# Patient Record
Sex: Female | Born: 1955
Health system: Southern US, Community
[De-identification: ages and names within clinical notes are randomized; demographics above are authoritative.]

## PROBLEM LIST (undated history)

## (undated) DIAGNOSIS — C50919 Malignant neoplasm of unspecified site of unspecified female breast: Secondary | ICD-10-CM

## (undated) DIAGNOSIS — Z923 Personal history of irradiation: Secondary | ICD-10-CM

## (undated) DIAGNOSIS — H269 Unspecified cataract: Secondary | ICD-10-CM

## (undated) DIAGNOSIS — G43909 Migraine, unspecified, not intractable, without status migrainosus: Secondary | ICD-10-CM

## (undated) DIAGNOSIS — Z9221 Personal history of antineoplastic chemotherapy: Secondary | ICD-10-CM

## (undated) DIAGNOSIS — K649 Unspecified hemorrhoids: Secondary | ICD-10-CM

## (undated) DIAGNOSIS — C801 Malignant (primary) neoplasm, unspecified: Secondary | ICD-10-CM

## (undated) DIAGNOSIS — Z8489 Family history of other specified conditions: Secondary | ICD-10-CM

## (undated) DIAGNOSIS — Z8614 Personal history of Methicillin resistant Staphylococcus aureus infection: Secondary | ICD-10-CM

## (undated) DIAGNOSIS — S82899A Other fracture of unspecified lower leg, initial encounter for closed fracture: Secondary | ICD-10-CM

## (undated) DIAGNOSIS — Z973 Presence of spectacles and contact lenses: Secondary | ICD-10-CM

## (undated) HISTORY — DX: Unspecified hemorrhoids: K64.9

## (undated) HISTORY — PX: MANDIBLE FRACTURE SURGERY: SHX706

## (undated) HISTORY — DX: Unspecified cataract: H26.9

## (undated) HISTORY — DX: Migraine, unspecified, not intractable, without status migrainosus: G43.909

## (undated) HISTORY — PX: FRACTURE SURGERY: SHX138

## (undated) HISTORY — DX: Malignant (primary) neoplasm, unspecified: C80.1

## (undated) HISTORY — PX: MASTECTOMY: SHX3

---

## 2001-02-25 ENCOUNTER — Other Ambulatory Visit: Admission: RE | Admit: 2001-02-25 | Discharge: 2001-02-25 | Payer: Self-pay | Admitting: Obstetrics and Gynecology

## 2001-03-16 ENCOUNTER — Other Ambulatory Visit: Admission: RE | Admit: 2001-03-16 | Discharge: 2001-03-16 | Payer: Self-pay | Admitting: Obstetrics and Gynecology

## 2001-09-24 ENCOUNTER — Other Ambulatory Visit: Admission: RE | Admit: 2001-09-24 | Discharge: 2001-09-24 | Payer: Self-pay | Admitting: Obstetrics and Gynecology

## 2005-09-02 ENCOUNTER — Ambulatory Visit: Payer: Self-pay | Admitting: Unknown Physician Specialty

## 2006-09-10 ENCOUNTER — Ambulatory Visit: Payer: Self-pay | Admitting: Unknown Physician Specialty

## 2007-08-11 ENCOUNTER — Ambulatory Visit: Payer: Self-pay | Admitting: Gastroenterology

## 2007-09-15 ENCOUNTER — Ambulatory Visit: Payer: Self-pay | Admitting: Unknown Physician Specialty

## 2008-09-20 ENCOUNTER — Ambulatory Visit: Payer: Self-pay | Admitting: Unknown Physician Specialty

## 2009-10-26 ENCOUNTER — Ambulatory Visit: Payer: Self-pay | Admitting: Unknown Physician Specialty

## 2010-10-31 ENCOUNTER — Ambulatory Visit: Payer: Self-pay | Admitting: Unknown Physician Specialty

## 2010-11-02 ENCOUNTER — Ambulatory Visit: Payer: Self-pay | Admitting: Unknown Physician Specialty

## 2010-11-18 HISTORY — PX: COLONOSCOPY: SHX5424

## 2011-06-04 ENCOUNTER — Ambulatory Visit: Payer: Self-pay | Admitting: Unknown Physician Specialty

## 2013-11-18 DIAGNOSIS — Z8614 Personal history of Methicillin resistant Staphylococcus aureus infection: Secondary | ICD-10-CM

## 2013-11-18 HISTORY — DX: Personal history of Methicillin resistant Staphylococcus aureus infection: Z86.14

## 2014-05-23 DIAGNOSIS — G252 Other specified forms of tremor: Secondary | ICD-10-CM | POA: Insufficient documentation

## 2015-07-17 ENCOUNTER — Ambulatory Visit (INDEPENDENT_AMBULATORY_CARE_PROVIDER_SITE_OTHER): Payer: 59 | Admitting: Nurse Practitioner

## 2015-07-17 ENCOUNTER — Encounter (INDEPENDENT_AMBULATORY_CARE_PROVIDER_SITE_OTHER): Payer: Self-pay

## 2015-07-17 ENCOUNTER — Encounter: Payer: Self-pay | Admitting: Nurse Practitioner

## 2015-07-17 VITALS — BP 116/68 | HR 60 | Temp 98.7°F | Resp 16 | Ht 65.75 in | Wt 152.6 lb

## 2015-07-17 DIAGNOSIS — Z13220 Encounter for screening for lipoid disorders: Secondary | ICD-10-CM | POA: Diagnosis not present

## 2015-07-17 DIAGNOSIS — Z7189 Other specified counseling: Secondary | ICD-10-CM

## 2015-07-17 DIAGNOSIS — Z130 Encounter for screening for diseases of the blood and blood-forming organs and certain disorders involving the immune mechanism: Secondary | ICD-10-CM

## 2015-07-17 DIAGNOSIS — Z131 Encounter for screening for diabetes mellitus: Secondary | ICD-10-CM | POA: Diagnosis not present

## 2015-07-17 DIAGNOSIS — G43809 Other migraine, not intractable, without status migrainosus: Secondary | ICD-10-CM

## 2015-07-17 DIAGNOSIS — Z1329 Encounter for screening for other suspected endocrine disorder: Secondary | ICD-10-CM

## 2015-07-17 DIAGNOSIS — Z0000 Encounter for general adult medical examination without abnormal findings: Secondary | ICD-10-CM | POA: Insufficient documentation

## 2015-07-17 DIAGNOSIS — N951 Menopausal and female climacteric states: Secondary | ICD-10-CM

## 2015-07-17 DIAGNOSIS — R232 Flushing: Secondary | ICD-10-CM

## 2015-07-17 DIAGNOSIS — Z1389 Encounter for screening for other disorder: Secondary | ICD-10-CM | POA: Diagnosis not present

## 2015-07-17 DIAGNOSIS — Z7689 Persons encountering health services in other specified circumstances: Secondary | ICD-10-CM

## 2015-07-17 DIAGNOSIS — Z85828 Personal history of other malignant neoplasm of skin: Secondary | ICD-10-CM

## 2015-07-17 LAB — CBC WITH DIFFERENTIAL/PLATELET
BASOS PCT: 0.7 % (ref 0.0–3.0)
Basophils Absolute: 0 10*3/uL (ref 0.0–0.1)
EOS ABS: 0.1 10*3/uL (ref 0.0–0.7)
Eosinophils Relative: 1.4 % (ref 0.0–5.0)
HCT: 45.3 % (ref 36.0–46.0)
Hemoglobin: 15.3 g/dL — ABNORMAL HIGH (ref 12.0–15.0)
LYMPHS ABS: 2.2 10*3/uL (ref 0.7–4.0)
Lymphocytes Relative: 39 % (ref 12.0–46.0)
MCHC: 33.8 g/dL (ref 30.0–36.0)
MCV: 89.9 fl (ref 78.0–100.0)
MONO ABS: 0.6 10*3/uL (ref 0.1–1.0)
Monocytes Relative: 9.9 % (ref 3.0–12.0)
NEUTROS ABS: 2.8 10*3/uL (ref 1.4–7.7)
Neutrophils Relative %: 49 % (ref 43.0–77.0)
PLATELETS: 260 10*3/uL (ref 150.0–400.0)
RBC: 5.03 Mil/uL (ref 3.87–5.11)
RDW: 12.6 % (ref 11.5–15.5)
WBC: 5.7 10*3/uL (ref 4.0–10.5)

## 2015-07-17 LAB — TSH: TSH: 1.32 u[IU]/mL (ref 0.35–4.50)

## 2015-07-17 LAB — HEMOGLOBIN A1C: HEMOGLOBIN A1C: 5.5 % (ref 4.6–6.5)

## 2015-07-17 NOTE — Patient Instructions (Signed)
Please visit the lab before leaving today.   Welcome to [facility]! It was nice to meet you!   Follow up in 6 months.

## 2015-07-17 NOTE — Progress Notes (Signed)
Patient ID: Megan Vega, female    DOB: 02/19/1956  Age: 59 y.o. MRN: [id number]  CC: Establish Care   HPI Megan Vega presents for establishing care and CC of   1) Health Maintenance-   Diet- Eats healthy   Exercise- 30 min x 5 days a week   Immunizations- 2014 tdap  Mammogram- 06/30/2015  Pap- 8/121/2016   Colonoscopy- q 10 years clean 2012   Eye Exam- 2011, will schedule    Dental Exam- UTD   2) Chronic Problems- Dr. Sharlett Iles (retired)- Basal cell x 5 yrs ago on chest; got the all clear  Migraines- [facility] in [place]  B vitamins, magnesium, Excedrin   Multivitamin   3) Acute Problems-  Hot flashes- decreased over the past year, took black cohosh was helpful   History Megan Vega has a past medical history of Cancer and Migraines.   She has no past surgical history on file.   Her family history includes Cancer in her father and mother; Diabetes in her brother and mother; Early death in her mother.She reports that she has never smoked. She has never used smokeless tobacco. She reports that she drinks alcohol. She reports that she does not use illicit drugs.  No outpatient prescriptions prior to visit.   No facility-administered medications prior to visit.   ROS Review of Systems  Constitutional: Negative for fever, chills, diaphoresis and fatigue.  HENT: Negative for tinnitus and trouble swallowing.   Eyes: Negative for visual disturbance.  Respiratory: Negative for chest tightness, shortness of breath and wheezing.   Cardiovascular: Negative for chest pain, palpitations and leg swelling.  Gastrointestinal: Negative for nausea, vomiting, diarrhea and constipation.  Genitourinary: Negative for difficulty urinating and menstrual problem.  Skin: Negative for rash.  Neurological: Positive for headaches. Negative for dizziness, weakness and numbness.       Well controlled with current regimen  Psychiatric/Behavioral: Negative for suicidal ideas and sleep disturbance.  The patient is not nervous/anxious.    Objective:  BP 116/68 mmHg  Pulse 60  Temp(Src) 98.7 F (37.1 C)  Resp 16  Ht 5' 5.75" (1.67 m)  Wt 152 lb 9.6 oz (69.219 kg)  BMI 24.82 kg/m2  SpO2 94%  Physical Exam  Constitutional: She is oriented to person, place, and time. She appears well-developed and well-nourished. No distress.  HENT:  Head: Normocephalic and atraumatic.  Right Ear: External ear normal.  Left Ear: External ear normal.  Cardiovascular: Normal rate, regular rhythm, normal heart sounds and intact distal pulses.  Exam reveals no gallop and no friction rub.   No murmur heard. Pulmonary/Chest: Effort normal and breath sounds normal. No respiratory distress. She has no wheezes. She has no rales. She exhibits no tenderness.  Neurological: She is alert and oriented to person, place, and time. No cranial nerve deficit. She exhibits normal muscle tone. Coordination normal.  Skin: Skin is warm and dry. No rash noted. She is not diaphoretic.  Psychiatric: She has a normal mood and affect. Her behavior is normal. Judgment and thought content normal.      Assessment & Plan:   Megan Vega was seen today for establish care.  Diagnoses and all orders for this visit:  Hot flashes  Encounter to establish care  Screening for nephropathy -     Comprehensive metabolic panel  Screening, anemia, deficiency, iron -     CBC with Differential/Platelet  Screening for hyperlipidemia -     Lipid panel  Screening for diabetes mellitus -     Hemoglobin  A1c  Screening for thyroid disorder -     TSH  History of basal cell cancer  Other migraine without status migrainosus, not intractable  Megan Vega does not currently have medications on file.  No orders of the defined types were placed in this encounter.     Follow-up: Return in about 6 months (around 01/16/2016).

## 2015-07-17 NOTE — Progress Notes (Signed)
Pre visit review using our clinic review tool, if applicable. No additional management support is needed unless otherwise documented below in the visit note. 

## 2015-07-18 LAB — LIPID PANEL
CHOL/HDL RATIO: 4
Cholesterol: 207 mg/dL — ABNORMAL HIGH (ref 0–200)
HDL: 58.4 mg/dL (ref >39.00)
LDL CALC: 127 mg/dL — AB (ref 0–99)
NonHDL: 148.47
TRIGLYCERIDES: 107 mg/dL (ref 0.0–149.0)
VLDL: 21.4 mg/dL (ref 0.0–40.0)

## 2015-07-18 LAB — COMPREHENSIVE METABOLIC PANEL
ALT: 24 U/L (ref 0–35)
AST: 25 U/L (ref 0–37)
Albumin: 4.5 g/dL (ref 3.5–5.2)
Alkaline Phosphatase: 76 U/L (ref 39–117)
BUN: 13 mg/dL (ref 6–23)
CHLORIDE: 102 meq/L (ref 96–112)
CO2: 30 meq/L (ref 19–32)
CREATININE: 0.91 mg/dL (ref 0.40–1.20)
Calcium: 9.7 mg/dL (ref 8.4–10.5)
GFR: 67.19 mL/min (ref >60.00)
Glucose, Bld: 76 mg/dL (ref 70–99)
Potassium: 4.2 mEq/L (ref 3.5–5.1)
SODIUM: 139 meq/L (ref 135–145)
Total Bilirubin: 0.8 mg/dL (ref 0.2–1.2)
Total Protein: 7.5 g/dL (ref 6.0–8.3)

## 2015-08-02 DIAGNOSIS — R232 Flushing: Secondary | ICD-10-CM | POA: Insufficient documentation

## 2015-08-02 DIAGNOSIS — Z85828 Personal history of other malignant neoplasm of skin: Secondary | ICD-10-CM | POA: Insufficient documentation

## 2015-08-02 DIAGNOSIS — Z130 Encounter for screening for diseases of the blood and blood-forming organs and certain disorders involving the immune mechanism: Secondary | ICD-10-CM | POA: Insufficient documentation

## 2015-08-02 DIAGNOSIS — Z131 Encounter for screening for diabetes mellitus: Secondary | ICD-10-CM | POA: Insufficient documentation

## 2015-08-02 DIAGNOSIS — Z1329 Encounter for screening for other suspected endocrine disorder: Secondary | ICD-10-CM | POA: Insufficient documentation

## 2015-08-02 DIAGNOSIS — G43909 Migraine, unspecified, not intractable, without status migrainosus: Secondary | ICD-10-CM | POA: Insufficient documentation

## 2015-08-02 NOTE — Assessment & Plan Note (Signed)
Pt on Black cohosh and found it helpful. They are improving over the last year, but she feels they should be less than they are. FU prn worsening/failure to improve.

## 2015-08-02 NOTE — Assessment & Plan Note (Signed)
5 years ago. Clear currently.

## 2015-08-02 NOTE — Assessment & Plan Note (Signed)
Discussed acute and chronic issues. Reviewed health maintenance measures, PFSHx, and immunizations. Obtain routine labs TSH, Lipid panel, CBC w/ diff, A1c, and CMET.   

## 2015-08-02 NOTE — Assessment & Plan Note (Signed)
Pt was seen by [facility] in [place]. Taking B vitamins, magnesium, excedrin, and another multivitamin. Helpful. Will follow.

## 2015-08-11 ENCOUNTER — Encounter: Payer: Self-pay | Admitting: Nurse Practitioner

## 2015-08-13 ENCOUNTER — Emergency Department
Admission: EM | Admit: 2015-08-13 | Discharge: 2015-08-13 | Disposition: A | Discharge status: Home / Self Care | Payer: 59 | Attending: Student | Admitting: Student

## 2015-08-13 ENCOUNTER — Encounter: Payer: Self-pay | Admitting: Emergency Medicine

## 2015-08-13 DIAGNOSIS — W57XXXA Bitten or stung by nonvenomous insect and other nonvenomous arthropods, initial encounter: Secondary | ICD-10-CM | POA: Insufficient documentation

## 2015-08-13 DIAGNOSIS — Y998 Other external cause status: Secondary | ICD-10-CM | POA: Diagnosis not present

## 2015-08-13 DIAGNOSIS — Y9289 Other specified places as the place of occurrence of the external cause: Secondary | ICD-10-CM | POA: Insufficient documentation

## 2015-08-13 DIAGNOSIS — L03115 Cellulitis of right lower limb: Secondary | ICD-10-CM | POA: Diagnosis not present

## 2015-08-13 DIAGNOSIS — S80861A Insect bite (nonvenomous), right lower leg, initial encounter: Secondary | ICD-10-CM | POA: Diagnosis present

## 2015-08-13 DIAGNOSIS — Y9389 Activity, other specified: Secondary | ICD-10-CM | POA: Diagnosis not present

## 2015-08-13 MED ORDER — SULFAMETHOXAZOLE-TRIMETHOPRIM 800-160 MG PO TABS: 1.0000 | ORAL_TABLET | Freq: Two times a day (BID) | ORAL | Status: DC

## 2015-08-13 MED ORDER — HYDROCODONE-ACETAMINOPHEN 5-325 MG PO TABS: 1.0000 | ORAL_TABLET | ORAL | Status: DC | PRN

## 2015-08-13 NOTE — ED Notes (Addendum)
Pt has abscess with erythema to right calf x1 week per report. Denies injury but assumes insect bite. Increasing redness and pain per report. OTC creams used without relief.

## 2015-08-13 NOTE — ED Provider Notes (Signed)
[facility] Emergency Department Provider Note  ____________________________________________  Time seen: Approximately 10:14 AM  I have reviewed the triage vital signs and the nursing notes.   HISTORY  Chief Complaint Animal Bite   HPI Megan Vega is a 59 y.o. female is here with complaint of possible spider bite on her left leg one week ago. Patient states that she has tried every over-the-counter cream without any improvement. Area continues to get more red and somewhat warm. There is been some drainage. She is unaware of any fever chills. She never actually saw the spider but is seems that it was one. Currently she rates her pain 8 out of 10 and constant.   Past Medical History  Diagnosis Date  . Cancer     Basal Cell Carcinoma, about 2011  . Migraines     Patient Active Problem List   Diagnosis Date Noted  . Hot flashes 08/02/2015  . Screening for thyroid disorder 08/02/2015  . Screening for diabetes mellitus 08/02/2015  . Screening, anemia, deficiency, iron 08/02/2015  . History of basal cell cancer 08/02/2015  . Migraines 08/02/2015  . Encounter to establish care 07/17/2015    History reviewed. No pertinent past surgical history.  Current Outpatient Rx  Name  Route  Sig  Dispense  Refill  . HYDROcodone-acetaminophen (NORCO/VICODIN) 5-325 MG per tablet   Oral   Take 1 tablet by mouth every 4 (four) hours as needed for moderate pain.   20 tablet   0   . sulfamethoxazole-trimethoprim (BACTRIM DS,SEPTRA DS) 800-160 MG per tablet   Oral   Take 1 tablet by mouth 2 (two) times daily.   20 tablet   0     Allergies Review of patient's allergies indicates no known allergies.  Family History  Problem Relation Age of Onset  . Cancer Mother     Esophageal Cancer  . Diabetes Mother     Controlled with diet  . Early death Mother     Age 77  . Cancer Father     Lung Cancer  . Diabetes Brother     Controlled with diet    Social  History Social History  Substance Use Topics  . Smoking status: Never Smoker   . Smokeless tobacco: Never Used  . Alcohol Use: Yes    Review of Systems Constitutional: No fever/chills ENT: No sore throat. Cardiovascular: Denies chest pain. Respiratory: Denies shortness of breath. Gastrointestinal:   No nausea, no vomiting. Genitourinary: Negative for dysuria. Musculoskeletal: Negative for back pain. Skin: Negative for rash. Positive for insect bite left leg Neurological: Negative for headaches, focal weakness or numbness.  10-point ROS otherwise negative.  ____________________________________________   PHYSICAL EXAM:  VITAL SIGNS: ED Triage Vitals  Enc Vitals Group     BP 08/13/15 0848 125/72 mmHg     Pulse Rate 08/13/15 0848 92     Resp 08/13/15 0848 20     Temp 08/13/15 0848 98 F (36.7 C)     Temp Source 08/13/15 0848 Oral     SpO2 08/13/15 0848 96 %     Weight 08/13/15 0848 150 lb (68.04 kg)     Height 08/13/15 0848 5\' 5"  (1.651 m)     Head Cir --      Peak Flow --      Pain Score 08/13/15 0852 8     Pain Loc --      Pain Edu? --      Excl. in [place]? --  Constitutional: Alert and oriented. Well appearing and in no acute distress. Eyes: Conjunctivae are normal. PERRL. EOMI. Head: Atraumatic. Nose: No congestion/rhinnorhea. Neck: No stridor.   Cardiovascular: Normal rate, regular rhythm. Grossly normal heart sounds.  Good peripheral circulation. Respiratory: Normal respiratory effort.  No retractions. Lungs CTAB. Gastrointestinal: Soft and nontender. No distention. Musculoskeletal: Moves all extremities with any difficulty. There is mild to moderate tenderness left leg around the lesion. There is no restriction with knee or ankle. Neurologic:  Normal speech and language. No gross focal neurologic deficits are appreciated. No gait instability. Skin:  Skin is warm, dry and intact. No rash noted. There is 1 single lesion on the left leg with open center without  active drainage. There is moderate erythema and warmth approximately 1-1/2-2 cm surrounding this area. There is no fluctuant area in the erythematous skin. Psychiatric: Mood and affect are normal. Speech and behavior are normal.  ____________________________________________   LABS (all labs ordered are listed, but only abnormal results are displayed)  Labs Reviewed - No data to display  PROCEDURES  Procedure(s) performed: None  Critical Care performed: No  ____________________________________________   INITIAL IMPRESSION / ASSESSMENT AND PLAN / ED COURSE  Pertinent labs & imaging results that were available during my care of the patient were reviewed by me and considered in my medical decision making (see chart for details).  Patient was placed on Septra DS twice a day for 10 days along with Norco as needed for pain. She is to follow-up with her PCP or follow-up with Dr. Adonis Huguenin if any continued problems. Discussed warm compresses to her leg frequently. She is to return to the emergency room if any severe worsening of hers leg __________________________________________   FINAL CLINICAL IMPRESSION(S) / ED DIAGNOSES  Final diagnoses:  Cellulitis of leg, right      Johnn Hai, PA-C 08/13/15 1043  Joanne Gavel, MD 08/13/15 1520

## 2015-08-13 NOTE — ED Notes (Signed)
Pt presents to the ER from home with complaints of pain to left lower extremity. Pt reports she believes she was bitten by a spider last week, area to left lower extremity has been progressively been getting worse. Pt reports pain, denies any fever. Pt talks in complete sentences denies any other symptom at present. No respiratory distress noted.

## 2015-08-24 ENCOUNTER — Ambulatory Visit: Payer: Self-pay | Admitting: Nurse Practitioner

## 2016-11-21 ENCOUNTER — Ambulatory Visit: Payer: 59 | Admitting: Family

## 2016-11-25 ENCOUNTER — Encounter: Payer: Self-pay | Admitting: Family

## 2016-11-25 ENCOUNTER — Ambulatory Visit (INDEPENDENT_AMBULATORY_CARE_PROVIDER_SITE_OTHER): Payer: BLUE CROSS/BLUE SHIELD | Admitting: Family

## 2016-11-25 VITALS — BP 134/80 | HR 72 | Temp 98.2°F | Ht 65.0 in | Wt 153.7 lb

## 2016-11-25 DIAGNOSIS — L03116 Cellulitis of left lower limb: Secondary | ICD-10-CM | POA: Diagnosis not present

## 2016-11-25 MED ORDER — DOXYCYCLINE HYCLATE 100 MG PO TBEC: 100.0000 mg | DELAYED_RELEASE_TABLET | Freq: Two times a day (BID) | ORAL | 0 refills | Status: DC

## 2016-11-25 NOTE — Progress Notes (Signed)
Pre visit review using our clinic review tool, if applicable. No additional management support is needed unless otherwise documented below in the visit note. 

## 2016-11-25 NOTE — Progress Notes (Signed)
Subjective:    Patient ID: Megan Vega, female    DOB: [birth year], 61 y.o.   MRN: [id number]  CC: Megan Vega is a 61 y.o. female who presents today for an acute visit.    HPI: CC: left leg sore x 10 days,worsening. 'felt like something stung me like had happened with right leg.' Tender. No fever, SOB. Purulent drainage  H/o staph infection, last to right leg and treated with doxycycline. Failed treatment to bactrim.  Told positive for MRSA.           HISTORY:  Past Medical History:  Diagnosis Date  . Cancer ([place])    Basal Cell Carcinoma, about 2011  . Migraines    No past surgical history on file. Family History  Problem Relation Age of Onset  . Cancer Mother     Esophageal Cancer  . Diabetes Mother     Controlled with diet  . Early death Mother     Age 63  . Cancer Father     Lung Cancer  . Diabetes Brother     Controlled with diet    Allergies: Patient has no known allergies. No current outpatient prescriptions on file prior to visit.   No current facility-administered medications on file prior to visit.     Social History  Substance Use Topics  . Smoking status: Never Smoker  . Smokeless tobacco: Never Used  . Alcohol use Yes    Review of Systems  Constitutional: Negative for chills and fever.  Respiratory: Negative for cough and shortness of breath.   Cardiovascular: Negative for chest pain, palpitations and leg swelling.  Gastrointestinal: Negative for nausea and vomiting.  Skin: Positive for wound.      Objective:    BP 134/80   Pulse 72   Temp 98.2 F (36.8 C) (Oral)   Ht 5\' 5"  (1.651 m)   Wt 153 lb 11.2 oz (69.7 kg)   SpO2 98%   BMI 25.58 kg/m    Physical Exam  Constitutional: She appears well-developed and well-nourished.  Eyes: Conjunctivae are normal.  Cardiovascular: Normal rate, regular rhythm, normal heart sounds and normal pulses.   Pulmonary/Chest: Effort normal and breath sounds normal. She has no wheezes. She  has no rhonchi. She has no rales.  Neurological: She is alert.  Skin: Skin is warm and dry.     Hyper erythematous indurated area, approx 4 cm in diameter, noted left lateral calf. No drainage. Surrounding erythema. Nonflutuant.   Psychiatric: She has a normal mood and affect. Her speech is normal and behavior is normal. Thought content normal.  Vitals reviewed.      Assessment & Plan:   Problem List Items Addressed This Visit      Other   Cellulitis of left lower extremity - Primary    Afebrile. Cellulitis responded well to doxycycline in the past. Failed Bactrim. History of MRSA. Unable to culture as wound nonfluctuant today. Patient will return in 2 days for wound check.      Relevant Medications   doxycycline (DORYX) 100 MG EC tablet        I have discontinued [name]'s HYDROcodone-acetaminophen and sulfamethoxazole-trimethoprim. I am also having her start on doxycycline.   Meds ordered this encounter  Medications  . doxycycline (DORYX) 100 MG EC tablet    Sig: Take 1 tablet (100 mg total) by mouth 2 (two) times daily.    Dispense:  20 tablet    Refill:  0    Order Specific Question:  Supervising Provider    Answer:   Crecencio Mc [[id number]]    Return precautions given.   Risks, benefits, and alternatives of the medications and treatment plan prescribed today were discussed, and patient expressed understanding.   Education regarding symptom management and diagnosis given to patient on AVS.  Continue to follow with Mable Paris, FNP for routine health maintenance.   Megan Vega and I agreed with plan.   Mable Paris, FNP

## 2016-11-25 NOTE — Patient Instructions (Addendum)
Due for physical, please make appt at check out.   Wound recheck on Wed afternoon or Thurs of this week   Pleasure meeting you  Continue to change dressings and let me know if not better    Cellulitis, Adult Cellulitis is a skin infection. The infected area is usually red and tender. This condition occurs most often in the arms and lower legs. The infection can travel to the muscles, blood, and underlying tissue and become serious. It is very important to get treated for this condition. What are the causes? Cellulitis is caused by bacteria. The bacteria enter through a break in the skin, such as a cut, burn, insect bite, open sore, or crack. What increases the risk? This condition is more likely to occur in people who:  Have a weak defense system (immune system).  Have open wounds on the skin such as cuts, burns, bites, and scrapes. Bacteria can enter the body through these open wounds.  Are older.  Have diabetes.  Have a type of long-lasting (chronic) liver disease (cirrhosis) or kidney disease.  Use IV drugs. What are the signs or symptoms? Symptoms of this condition include:  Redness, streaking, or spotting on the skin.  Swollen area of the skin.  Tenderness or pain when an area of the skin is touched.  Warm skin.  Fever.  Chills.  Blisters. How is this diagnosed? This condition is diagnosed based on a medical history and physical exam. You may also have tests, including:  Blood tests.  Lab tests.  Imaging tests. How is this treated? Treatment for this condition may include:  Medicines, such as antibiotic medicines or antihistamines.  Supportive care, such as rest and application of cold or warm cloths (cold or warm compresses) to the skin.  Hospital care, if the condition is severe. The infection usually gets better within 1-2 days of treatment. Follow these instructions at home:  Take over-the-counter and prescription medicines only as told by your  health care provider.  If you were prescribed an antibiotic medicine, take it as told by your health care provider. Do not stop taking the antibiotic even if you start to feel better.  Drink enough fluid to keep your urine clear or pale yellow.  Do not touch or rub the infected area.  Raise (elevate) the infected area above the level of your heart while you are sitting or lying down.  Apply warm or cold compresses to the affected area as told by your health care provider.  Keep all follow-up visits as told by your health care provider. This is important. These visits let your health care provider make sure a more serious infection is not developing. Contact a health care provider if:  You have a fever.  Your symptoms do not improve within 1-2 days of starting treatment.  Your bone or joint underneath the infected area becomes painful after the skin has healed.  Your infection returns in the same area or another area.  You notice a swollen bump in the infected area.  You develop new symptoms.  You have a general ill feeling (malaise) with muscle aches and pains. Get help right away if:  Your symptoms get worse.  You feel very sleepy.  You develop vomiting or diarrhea that persists.  You notice red streaks coming from the infected area.  Your red area gets larger or turns dark in color. This information is not intended to replace advice given to you by your health care provider. Make sure you  discuss any questions you have with your health care provider. Document Released: 08/14/2005 Document Revised: 03/14/2016 Document Reviewed: 09/13/2015 Elsevier Interactive Patient Education  2017 [employer].

## 2016-11-26 DIAGNOSIS — L03116 Cellulitis of left lower limb: Secondary | ICD-10-CM | POA: Insufficient documentation

## 2016-11-26 NOTE — Assessment & Plan Note (Signed)
Afebrile. Cellulitis responded well to doxycycline in the past. Failed Bactrim. History of MRSA. Unable to culture as wound nonfluctuant today. Patient will return in 2 days for wound check.

## 2016-12-05 ENCOUNTER — Ambulatory Visit: Payer: BLUE CROSS/BLUE SHIELD | Admitting: Family

## 2016-12-09 ENCOUNTER — Ambulatory Visit (INDEPENDENT_AMBULATORY_CARE_PROVIDER_SITE_OTHER): Payer: BLUE CROSS/BLUE SHIELD | Admitting: Family

## 2016-12-09 ENCOUNTER — Encounter: Payer: Self-pay | Admitting: Family

## 2016-12-09 VITALS — BP 140/86 | HR 75 | Temp 98.0°F | Ht 65.0 in | Wt 155.2 lb

## 2016-12-09 DIAGNOSIS — L03116 Cellulitis of left lower limb: Secondary | ICD-10-CM | POA: Diagnosis not present

## 2016-12-09 MED ORDER — MUPIROCIN CALCIUM 2 % EX CREA: 1.0000 "application " | TOPICAL_CREAM | Freq: Three times a day (TID) | CUTANEOUS | 0 refills | Status: DC

## 2016-12-09 NOTE — Patient Instructions (Signed)
Glad it is looking better

## 2016-12-09 NOTE — Progress Notes (Signed)
   Subjective:    Patient ID: Megan Vega, female    DOB: [birth year], 61 y.o.   MRN: [id number]  CC: Megan Vega is a 61 y.o. female who presents today for follow up.   HPI: Follow up left leg cellulitis 2 weeks ago. Started on doxycycline and improved. No pain, itching, discharge from wound. Scab left.   No fever, chills.     HISTORY:  Past Medical History:  Diagnosis Date  . Cancer ([place])    Basal Cell Carcinoma, about 2011  . Migraines    No past surgical history on file. Family History  Problem Relation Age of Onset  . Cancer Mother     Esophageal Cancer  . Diabetes Mother     Controlled with diet  . Early death Mother     Age 2  . Cancer Father     Lung Cancer  . Diabetes Brother     Controlled with diet    Allergies: Patient has no known allergies. No current outpatient prescriptions on file prior to visit.   No current facility-administered medications on file prior to visit.     Social History  Substance Use Topics  . Smoking status: Never Smoker  . Smokeless tobacco: Never Used  . Alcohol use Yes    Review of Systems  Constitutional: Negative for chills and fever.  Respiratory: Negative for cough.   Cardiovascular: Negative for chest pain and palpitations.  Gastrointestinal: Negative for nausea and vomiting.      Objective:    BP 140/86   Pulse 75   Temp 98 F (36.7 C) (Oral)   Ht 5\' 5"  (1.651 m)   Wt 155 lb 3.2 oz (70.4 kg)   SpO2 97%   BMI 25.83 kg/m  BP Readings from Last 3 Encounters:  12/09/16 140/86  11/25/16 134/80  08/13/15 125/72   Wt Readings from Last 3 Encounters:  12/09/16 155 lb 3.2 oz (70.4 kg)  11/25/16 153 lb 11.2 oz (69.7 kg)  08/13/15 150 lb (68 kg)    Physical Exam  Constitutional: She appears well-developed and well-nourished.  Eyes: Conjunctivae are normal.  Cardiovascular: Normal rate, regular rhythm, normal heart sounds and normal pulses.   Pulmonary/Chest: Effort normal and breath sounds normal.  She has no wheezes. She has no rhonchi. She has no rales.  Neurological: She is alert.  Skin: Skin is warm and dry.     Scab and localized erythema LLE. No discharge, warmth.   Psychiatric: She has a normal mood and affect. Her speech is normal and behavior is normal. Thought content normal.  Vitals reviewed.      Assessment & Plan:   1. Cellulitis of left lower extremity Resolving. No active infection, only scab, and suspect scarring.  Afebrile. Given bactroban for future wounds.   - mupirocin cream (BACTROBAN) 2 %; Apply 1 application topically 3 (three) times daily.  Dispense: 15 g; Refill: 0   I have discontinued Ms. Turski's doxycycline.   No orders of the defined types were placed in this encounter.   Return precautions given.   Risks, benefits, and alternatives of the medications and treatment plan prescribed today were discussed, and patient expressed understanding.   Education regarding symptom management and diagnosis given to patient on AVS.  Continue to follow with Mable Paris, FNP for routine health maintenance.   Megan Vega and I agreed with plan.   Mable Paris, FNP

## 2016-12-09 NOTE — Progress Notes (Signed)
Pre visit review using our clinic review tool, if applicable. No additional management support is needed unless otherwise documented below in the visit note. 

## 2017-08-06 ENCOUNTER — Encounter: Payer: BLUE CROSS/BLUE SHIELD | Admitting: Family

## 2017-08-20 ENCOUNTER — Other Ambulatory Visit (HOSPITAL_COMMUNITY)
Admission: RE | Admit: 2017-08-20 | Discharge: 2017-08-20 | Disposition: A | Discharge status: Home / Self Care | Payer: BLUE CROSS/BLUE SHIELD | Source: Ambulatory Visit | Attending: Family | Admitting: Family

## 2017-08-20 ENCOUNTER — Telehealth: Payer: Self-pay

## 2017-08-20 ENCOUNTER — Encounter: Payer: Self-pay | Admitting: Family

## 2017-08-20 ENCOUNTER — Ambulatory Visit (INDEPENDENT_AMBULATORY_CARE_PROVIDER_SITE_OTHER): Payer: BLUE CROSS/BLUE SHIELD | Admitting: Family

## 2017-08-20 VITALS — BP 124/82 | HR 78 | Temp 98.0°F | Ht 65.0 in | Wt 160.8 lb

## 2017-08-20 DIAGNOSIS — Z0000 Encounter for general adult medical examination without abnormal findings: Secondary | ICD-10-CM

## 2017-08-20 LAB — LIPID PANEL
Cholesterol: 207 mg/dL — ABNORMAL HIGH (ref 0–200)
HDL: 53.8 mg/dL (ref >39.00)
LDL Cholesterol: 126 mg/dL — ABNORMAL HIGH (ref 0–99)
NONHDL: 153.32
TRIGLYCERIDES: 136 mg/dL (ref 0.0–149.0)
Total CHOL/HDL Ratio: 4
VLDL: 27.2 mg/dL (ref 0.0–40.0)

## 2017-08-20 LAB — CBC WITH DIFFERENTIAL/PLATELET
Basophils Absolute: 0.1 10*3/uL (ref 0.0–0.1)
Basophils Relative: 1.3 % (ref 0.0–3.0)
EOS PCT: 1.7 % (ref 0.0–5.0)
Eosinophils Absolute: 0.1 10*3/uL (ref 0.0–0.7)
HCT: 44.7 % (ref 36.0–46.0)
HEMOGLOBIN: 15 g/dL (ref 12.0–15.0)
Lymphocytes Relative: 36.8 % (ref 12.0–46.0)
Lymphs Abs: 1.6 10*3/uL (ref 0.7–4.0)
MCHC: 33.6 g/dL (ref 30.0–36.0)
MCV: 89 fl (ref 78.0–100.0)
Monocytes Absolute: 0.5 10*3/uL (ref 0.1–1.0)
Monocytes Relative: 10.3 % (ref 3.0–12.0)
Neutro Abs: 2.2 10*3/uL (ref 1.4–7.7)
Neutrophils Relative %: 49.9 % (ref 43.0–77.0)
Platelets: 270 10*3/uL (ref 150.0–400.0)
RBC: 5.02 Mil/uL (ref 3.87–5.11)
RDW: 12.5 % (ref 11.5–15.5)
WBC: 4.4 10*3/uL (ref 4.0–10.5)

## 2017-08-20 LAB — COMPREHENSIVE METABOLIC PANEL
ALBUMIN: 4.6 g/dL (ref 3.5–5.2)
ALK PHOS: 67 U/L (ref 39–117)
ALT: 19 U/L (ref 0–35)
AST: 24 U/L (ref 0–37)
BUN: 11 mg/dL (ref 6–23)
CO2: 29 mEq/L (ref 19–32)
Calcium: 9.9 mg/dL (ref 8.4–10.5)
Chloride: 103 mEq/L (ref 96–112)
Creatinine, Ser: 0.99 mg/dL (ref 0.40–1.20)
GFR: 60.54 mL/min (ref >60.00)
Glucose, Bld: 101 mg/dL — ABNORMAL HIGH (ref 70–99)
POTASSIUM: 4.7 meq/L (ref 3.5–5.1)
Sodium: 139 mEq/L (ref 135–145)
TOTAL PROTEIN: 7.9 g/dL (ref 6.0–8.3)
Total Bilirubin: 0.8 mg/dL (ref 0.2–1.2)

## 2017-08-20 LAB — VITAMIN D 25 HYDROXY (VIT D DEFICIENCY, FRACTURES): VITD: 35.35 ng/mL (ref 30.00–100.00)

## 2017-08-20 LAB — TSH: TSH: 1.23 u[IU]/mL (ref 0.35–4.50)

## 2017-08-20 LAB — HEMOGLOBIN A1C: HEMOGLOBIN A1C: 5.9 % (ref 4.6–6.5)

## 2017-08-20 NOTE — Telephone Encounter (Signed)
FYI. Patient was unable to give enough blood for HIV and the HEP C.

## 2017-08-20 NOTE — Progress Notes (Signed)
Pre visit review using our clinic review tool, if applicable. No additional management support is needed unless otherwise documented below in the visit note. 

## 2017-08-20 NOTE — Assessment & Plan Note (Addendum)
Pap and cbe performed. Patient thinks colonoscopy UTD and will bring me records. Labs today. Referral to derm. Patient understands to schedule mammogram.

## 2017-08-20 NOTE — Patient Instructions (Addendum)
Please let me know when last colonoscopy was; please bring records by.   We placed a referral for mammogram this year. I asked that you call one the below locations and schedule this when it is convenient for you.   As discussed, I would like you to ask for 3D mammogram over the traditional 2D mammogram as new evidence suggest 3D is superior.   Please note that NOT all insurance companies cover 3D and you may have to pay a higher copay. You may call your insurance company to further clarify your benefits.   Options for [place]  [place], [place]  * [facility] [place], [place] * Note if you ask for 3D mammogram at this location, you must request [place], [place] location*   Labs today  Health Maintenance, Female Adopting a healthy lifestyle and getting preventive care can go a long way to promote health and wellness. Talk with your health care provider about what schedule of regular examinations is right for you. This is a good chance for you to check in with your provider about disease prevention and staying healthy. In between checkups, there are plenty of things you can do on your own. Experts have done a lot of research about which lifestyle changes and preventive measures are most likely to keep you healthy. Ask your health care provider for more information. Weight and diet Eat a healthy diet  Be sure to include plenty of vegetables, fruits, low-fat dairy products, and lean protein.  Do not eat a lot of foods high in solid fats, added sugars, or salt.  Get regular exercise. This is one of the most important things you can do for your health. ? Most adults should exercise for at least 150 minutes each week. The exercise should increase your heart rate and make you sweat (moderate-intensity exercise). ? Most adults should also  do strengthening exercises at least twice a week. This is in addition to the moderate-intensity exercise.  Maintain a healthy weight  Body mass index (BMI) is a measurement that can be used to identify possible weight problems. It estimates body fat based on height and weight. Your health care provider can help determine your BMI and help you achieve or maintain a healthy weight.  For females 73 years of age and older: ? A BMI below 18.5 is considered underweight. ? A BMI of 18.5 to 24.9 is normal. ? A BMI of 25 to 29.9 is considered overweight. ? A BMI of 30 and above is considered obese.  Watch levels of cholesterol and blood lipids  You should start having your blood tested for lipids and cholesterol at 61 years of age, then have this test every 5 years.  You may need to have your cholesterol levels checked more often if: ? Your lipid or cholesterol levels are high. ? You are older than 61 years of age. ? You are at high risk for heart disease.  Cancer screening Lung Cancer  Lung cancer screening is recommended for adults 59-35 years old who are at high risk for lung cancer because of a history of smoking.  A yearly low-dose CT scan of the lungs is recommended for people who: ? Currently smoke. ? Have quit within the past 15 years. ? Have at least a 30-pack-year history of smoking. A pack year is smoking an average of  one pack of cigarettes a day for 1 year.  Yearly screening should continue until it has been 15 years since you quit.  Yearly screening should stop if you develop a health problem that would prevent you from having lung cancer treatment.  Breast Cancer  Practice breast self-awareness. This means understanding how your breasts normally appear and feel.  It also means doing regular breast self-exams. Let your health care provider know about any changes, no matter how small.  If you are in your 20s or 30s, you should have a clinical breast exam (CBE) by a health  care provider every 1-3 years as part of a regular health exam.  If you are 65 or older, have a CBE every year. Also consider having a breast X-ray (mammogram) every year.  If you have a family history of breast cancer, talk to your health care provider about genetic screening.  If you are at high risk for breast cancer, talk to your health care provider about having an MRI and a mammogram every year.  Breast cancer gene (BRCA) assessment is recommended for women who have family members with BRCA-related cancers. BRCA-related cancers include: ? Breast. ? Ovarian. ? Tubal. ? Peritoneal cancers.  Results of the assessment will determine the need for genetic counseling and BRCA1 and BRCA2 testing.  Cervical Cancer Your health care provider may recommend that you be screened regularly for cancer of the pelvic organs (ovaries, uterus, and vagina). This screening involves a pelvic examination, including checking for microscopic changes to the surface of your cervix (Pap test). You may be encouraged to have this screening done every 3 years, beginning at age 39.  For women ages 64-65, health care providers may recommend pelvic exams and Pap testing every 3 years, or they may recommend the Pap and pelvic exam, combined with testing for human papilloma virus (HPV), every 5 years. Some types of HPV increase your risk of cervical cancer. Testing for HPV may also be done on women of any age with unclear Pap test results.  Other health care providers may not recommend any screening for nonpregnant women who are considered low risk for pelvic cancer and who do not have symptoms. Ask your health care provider if a screening pelvic exam is right for you.  If you have had past treatment for cervical cancer or a condition that could lead to cancer, you need Pap tests and screening for cancer for at least 20 years after your treatment. If Pap tests have been discontinued, your risk factors (such as having a new  sexual partner) need to be reassessed to determine if screening should resume. Some women have medical problems that increase the chance of getting cervical cancer. In these cases, your health care provider may recommend more frequent screening and Pap tests.  Colorectal Cancer  This type of cancer can be detected and often prevented.  Routine colorectal cancer screening usually begins at 60 years of age and continues through 61 years of age.  Your health care provider may recommend screening at an earlier age if you have risk factors for colon cancer.  Your health care provider may also recommend using home test kits to check for hidden blood in the stool.  A small camera at the end of a tube can be used to examine your colon directly (sigmoidoscopy or colonoscopy). This is done to check for the earliest forms of colorectal cancer.  Routine screening usually begins at age 50.  Direct examination of the colon should  be repeated every 5-10 years through 61 years of age. However, you may need to be screened more often if early forms of precancerous polyps or small growths are found.  Skin Cancer  Check your skin from head to toe regularly.  Tell your health care provider about any new moles or changes in moles, especially if there is a change in a mole's shape or color.  Also tell your health care provider if you have a mole that is larger than the size of a pencil eraser.  Always use sunscreen. Apply sunscreen liberally and repeatedly throughout the day.  Protect yourself by wearing long sleeves, pants, a wide-brimmed hat, and sunglasses whenever you are outside.  Heart disease, diabetes, and high blood pressure  High blood pressure causes heart disease and increases the risk of stroke. High blood pressure is more likely to develop in: ? People who have blood pressure in the high end of the normal range (130-139/85-89 mm Hg). ? People who are overweight or obese. ? People who are  African American.  If you are 74-79 years of age, have your blood pressure checked every 3-5 years. If you are 62 years of age or older, have your blood pressure checked every year. You should have your blood pressure measured twice-once when you are at a hospital or clinic, and once when you are not at a hospital or clinic. Record the average of the two measurements. To check your blood pressure when you are not at a hospital or clinic, you can use: ? An automated blood pressure machine at a pharmacy. ? A home blood pressure monitor.  If you are between 81 years and 49 years old, ask your health care provider if you should take aspirin to prevent strokes.  Have regular diabetes screenings. This involves taking a blood sample to check your fasting blood sugar level. ? If you are at a normal weight and have a low risk for diabetes, have this test once every three years after 61 years of age. ? If you are overweight and have a high risk for diabetes, consider being tested at a younger age or more often. Preventing infection Hepatitis B  If you have a higher risk for hepatitis B, you should be screened for this virus. You are considered at high risk for hepatitis B if: ? You were born in a country where hepatitis B is common. Ask your health care provider which countries are considered high risk. ? Your parents were born in a high-risk country, and you have not been immunized against hepatitis B (hepatitis B vaccine). ? You have HIV or AIDS. ? You use needles to inject street drugs. ? You live with someone who has hepatitis B. ? You have had sex with someone who has hepatitis B. ? You get hemodialysis treatment. ? You take certain medicines for conditions, including cancer, organ transplantation, and autoimmune conditions.  Hepatitis C  Blood testing is recommended for: ? Everyone born from [birth year] through 1965. ? Anyone with known risk factors for hepatitis C.  Sexually transmitted  infections (STIs)  You should be screened for sexually transmitted infections (STIs) including gonorrhea and chlamydia if: ? You are sexually active and are younger than 61 years of age. ? You are older than 61 years of age and your health care provider tells you that you are at risk for this type of infection. ? Your sexual activity has changed since you were last screened and you are at an increased risk  for chlamydia or gonorrhea. Ask your health care provider if you are at risk.  If you do not have HIV, but are at risk, it may be recommended that you take a prescription medicine daily to prevent HIV infection. This is called pre-exposure prophylaxis (PrEP). You are considered at risk if: ? You are sexually active and do not regularly use condoms or know the HIV status of your partner(s). ? You take drugs by injection. ? You are sexually active with a partner who has HIV.  Talk with your health care provider about whether you are at high risk of being infected with HIV. If you choose to begin PrEP, you should first be tested for HIV. You should then be tested every 3 months for as long as you are taking PrEP. Pregnancy  If you are premenopausal and you may become pregnant, ask your health care provider about preconception counseling.  If you may become pregnant, take 400 to 800 micrograms (mcg) of folic acid every day.  If you want to prevent pregnancy, talk to your health care provider about birth control (contraception). Osteoporosis and menopause  Osteoporosis is a disease in which the bones lose minerals and strength with aging. This can result in serious bone fractures. Your risk for osteoporosis can be identified using a bone density scan.  If you are 54 years of age or older, or if you are at risk for osteoporosis and fractures, ask your health care provider if you should be screened.  Ask your health care provider whether you should take a calcium or vitamin D supplement to lower  your risk for osteoporosis.  Menopause may have certain physical symptoms and risks.  Hormone replacement therapy may reduce some of these symptoms and risks. Talk to your health care provider about whether hormone replacement therapy is right for you. Follow these instructions at home:  Schedule regular health, dental, and eye exams.  Stay current with your immunizations.  Do not use any tobacco products including cigarettes, chewing tobacco, or electronic cigarettes.  If you are pregnant, do not drink alcohol.  If you are breastfeeding, limit how much and how often you drink alcohol.  Limit alcohol intake to no more than 1 drink per day for nonpregnant women. One drink equals 12 ounces of beer, 5 ounces of wine, or 1 ounces of hard liquor.  Do not use street drugs.  Do not share needles.  Ask your health care provider for help if you need support or information about quitting drugs.  Tell your health care provider if you often feel depressed.  Tell your health care provider if you have ever been abused or do not feel safe at home. This information is not intended to replace advice given to you by your health care provider. Make sure you discuss any questions you have with your health care provider. Document Released: 05/20/2011 Document Revised: 04/11/2016 Document Reviewed: 08/08/2015 Elsevier Interactive Patient Education  [employer].

## 2017-08-20 NOTE — Progress Notes (Signed)
Subjective:    Patient ID: Megan Vega, female    DOB: 03-07-1956, 61 y.o.   MRN: [id number]  CC: Lilyana Lippman is a 61 y.o. female who presents today for physical exam.    HPI: Feeling well. No complaints.     Colorectal Cancer Screening: due; however thinks UTD Breast Cancer Screening: Mammogram due Cervical Cancer Screening: due;  Bone Health screening/DEXA for 65+: No increased fracture risk. Defer screening at this time. Lung Cancer Screening: Doesn't have 30 year pack year history and age > 28 years.       Tetanus - utd  Hepatitis C screening - Candidate for, consents HIV Screening- Candidate for , consents Labs: Screening labs today. Exercise: Gets regular exercise.  Alcohol use: Occasionally.  Smoking/tobacco use: Nonsmoker.  Regular dental exams:UTD Wears seat belt: Yes. Skin: changed lesion right arm; h/o skin cancer.   HISTORY:  Past Medical History:  Diagnosis Date  . Cancer ([place])    Basal Cell Carcinoma, about 2011  . Migraines     History reviewed. No pertinent surgical history. Family History  Problem Relation Age of Onset  . Cancer Mother        Esophageal Cancer  . Diabetes Mother        Controlled with diet  . Early death Mother        Age 97  . Cancer Father        Lung Cancer  . Diabetes Brother        Controlled with diet      ALLERGIES: Patient has no known allergies.  No current outpatient prescriptions on file prior to visit.   No current facility-administered medications on file prior to visit.     Social History  Substance Use Topics  . Smoking status: Never Smoker  . Smokeless tobacco: Never Used  . Alcohol use Yes    Review of Systems  Constitutional: Negative for chills, fever and unexpected weight change.  HENT: Negative for congestion.   Respiratory: Negative for cough.   Cardiovascular: Negative for chest pain, palpitations and leg swelling.  Gastrointestinal: Negative for nausea and vomiting.    Genitourinary: Negative for dysuria, pelvic pain and vaginal bleeding.  Musculoskeletal: Negative for arthralgias and myalgias.  Skin: Negative for rash.  Neurological: Negative for headaches.  Hematological: Negative for adenopathy.  Psychiatric/Behavioral: Negative for confusion.      Objective:    BP 124/82   Pulse 78   Temp 98 F (36.7 C) (Oral)   Ht 5\' 5"  (1.651 m)   Wt 160 lb 12.8 oz (72.9 kg)   SpO2 96%   BMI 26.76 kg/m   BP Readings from Last 3 Encounters:  08/20/17 124/82  12/09/16 140/86  11/25/16 134/80   Wt Readings from Last 3 Encounters:  08/20/17 160 lb 12.8 oz (72.9 kg)  12/09/16 155 lb 3.2 oz (70.4 kg)  11/25/16 153 lb 11.2 oz (69.7 kg)    Physical Exam  Constitutional: She appears well-developed and well-nourished.  Eyes: Conjunctivae are normal.  Neck: No thyroid mass and no thyromegaly present.  Cardiovascular: Normal rate, regular rhythm, normal heart sounds and normal pulses.   Pulmonary/Chest: Effort normal and breath sounds normal. She has no wheezes. She has no rhonchi. She has no rales. Right breast exhibits no inverted nipple, no mass, no nipple discharge, no skin change and no tenderness. Left breast exhibits no inverted nipple, no mass, no nipple discharge, no skin change and no tenderness. Breasts are symmetrical.  No masses or  asymmetry appreciated during CBE.  Genitourinary: Uterus is not enlarged, not fixed and not tender. Cervix exhibits no motion tenderness, no discharge and no friability. Right adnexum displays no mass, no tenderness and no fullness. Left adnexum displays no mass, no tenderness and no fullness.  Genitourinary Comments: Pap performed. No CMT. Unable to appreciated ovaries.  Lymphadenopathy:       Head (right side): No submental, no submandibular, no tonsillar, no preauricular, no posterior auricular and no occipital adenopathy present.       Head (left side): No submental, no submandibular, no tonsillar, no preauricular,  no posterior auricular and no occipital adenopathy present.       Right cervical: No superficial cervical, no deep cervical and no posterior cervical adenopathy present.      Left cervical: No superficial cervical, no deep cervical and no posterior cervical adenopathy present.    She has no axillary adenopathy.       Right axillary: No pectoral and no lateral adenopathy present.       Left axillary: No pectoral and no lateral adenopathy present. Neurological: She is alert.  Skin: Skin is warm and dry.  Darkened nevi right medial aspect of forearm. Non bleeding.   Psychiatric: She has a normal mood and affect. Her speech is normal and behavior is normal. Thought content normal.  Vitals reviewed.      Assessment & Plan:   Problem List Items Addressed This Visit      Other   Routine physical examination - Primary    Pap and cbe performed. Patient thinks colonoscopy UTD and will bring me records. Labs today. Referral to derm. Patient understands to schedule mammogram.       Relevant Orders   CBC with Differential/Platelet   Comprehensive metabolic panel   Hemoglobin A1c   Hepatitis C antibody   HIV antibody   Lipid panel   Cytology - PAP   TSH   VITAMIN D 25 Hydroxy (Vit-D Deficiency, Fractures)   Ambulatory referral to Dermatology   MM SCREENING BREAST TOMO BILATERAL       I have discontinued Ms. Baisley's mupirocin cream.   No orders of the defined types were placed in this encounter.   Return precautions given.   Risks, benefits, and alternatives of the medications and treatment plan prescribed today were discussed, and patient expressed understanding.   Education regarding symptom management and diagnosis given to patient on AVS.   Continue to follow with Burnard Hawthorne, FNP for routine health maintenance.   Megan Vega and I agreed with plan.   Mable Paris, FNP

## 2017-08-21 ENCOUNTER — Other Ambulatory Visit (INDEPENDENT_AMBULATORY_CARE_PROVIDER_SITE_OTHER): Payer: BLUE CROSS/BLUE SHIELD

## 2017-08-21 DIAGNOSIS — Z0000 Encounter for general adult medical examination without abnormal findings: Secondary | ICD-10-CM | POA: Diagnosis not present

## 2017-08-21 LAB — CYTOLOGY - PAP
Diagnosis: NEGATIVE
HPV: NOT DETECTED

## 2017-08-21 NOTE — Telephone Encounter (Signed)
Future orders placed; please call pt and make her lab appt

## 2017-08-21 NOTE — Addendum Note (Signed)
Addended by: Arby Barrette on: 08/21/2017 07:38 AM   Modules accepted: Orders

## 2017-08-21 NOTE — Telephone Encounter (Signed)
Patient has lab appointment today at 1600

## 2017-08-22 LAB — HEPATITIS C ANTIBODY
Hepatitis C Ab: NONREACTIVE
SIGNAL TO CUT-OFF: 0.03 (ref <1.00)

## 2017-08-22 LAB — HIV ANTIBODY (ROUTINE TESTING W REFLEX): HIV 1&2 Ab, 4th Generation: NONREACTIVE

## 2017-08-26 ENCOUNTER — Other Ambulatory Visit: Payer: Self-pay | Admitting: *Deleted

## 2017-08-26 ENCOUNTER — Inpatient Hospital Stay
Admission: RE | Admit: 2017-08-26 | Discharge: 2017-08-26 | Disposition: A | Discharge status: Home / Self Care | Payer: Self-pay | Source: Ambulatory Visit | Attending: *Deleted | Admitting: *Deleted

## 2017-08-26 DIAGNOSIS — Z9289 Personal history of other medical treatment: Secondary | ICD-10-CM

## 2017-10-14 ENCOUNTER — Ambulatory Visit
Admission: RE | Admit: 2017-10-14 | Discharge: 2017-10-14 | Disposition: A | Discharge status: Home / Self Care | Payer: BLUE CROSS/BLUE SHIELD | Source: Ambulatory Visit | Attending: Family | Admitting: Family

## 2017-10-14 DIAGNOSIS — R928 Other abnormal and inconclusive findings on diagnostic imaging of breast: Secondary | ICD-10-CM | POA: Diagnosis not present

## 2017-10-14 DIAGNOSIS — N6489 Other specified disorders of breast: Secondary | ICD-10-CM | POA: Diagnosis not present

## 2017-10-14 DIAGNOSIS — R921 Mammographic calcification found on diagnostic imaging of breast: Secondary | ICD-10-CM | POA: Diagnosis not present

## 2017-10-14 DIAGNOSIS — Z1231 Encounter for screening mammogram for malignant neoplasm of breast: Secondary | ICD-10-CM | POA: Insufficient documentation

## 2017-10-14 DIAGNOSIS — Z0000 Encounter for general adult medical examination without abnormal findings: Secondary | ICD-10-CM

## 2017-10-15 ENCOUNTER — Other Ambulatory Visit: Payer: Self-pay | Admitting: Family

## 2017-10-15 ENCOUNTER — Other Ambulatory Visit: Payer: Self-pay | Admitting: Internal Medicine

## 2017-10-15 ENCOUNTER — Telehealth: Payer: Self-pay

## 2017-10-15 DIAGNOSIS — N6320 Unspecified lump in the left breast, unspecified quadrant: Secondary | ICD-10-CM

## 2017-10-15 DIAGNOSIS — R921 Mammographic calcification found on diagnostic imaging of breast: Secondary | ICD-10-CM

## 2017-10-15 DIAGNOSIS — N6310 Unspecified lump in the right breast, unspecified quadrant: Secondary | ICD-10-CM

## 2017-10-15 DIAGNOSIS — R928 Other abnormal and inconclusive findings on diagnostic imaging of breast: Secondary | ICD-10-CM

## 2017-10-15 NOTE — Telephone Encounter (Signed)
Spoke with Langley Gauss to let her know that orders were in

## 2017-10-15 NOTE — Telephone Encounter (Signed)
Yes, please order,  I will sign

## 2017-10-15 NOTE — Telephone Encounter (Signed)
Ok to give orders?  Copied from [place] [id number]. Topic: General - Other >> Oct 15, 2017  9:42 AM Yvette Rack wrote: Reason for CRM:  Langley Gauss from [facility] [phone] calling for orders of imaging of left breast calcification  and imaging of right distortion and mass

## 2017-10-15 NOTE — Telephone Encounter (Signed)
Left message for Denise to call back

## 2017-10-16 ENCOUNTER — Other Ambulatory Visit: Payer: Self-pay | Admitting: Family Medicine

## 2017-10-16 DIAGNOSIS — N6310 Unspecified lump in the right breast, unspecified quadrant: Secondary | ICD-10-CM

## 2017-10-16 DIAGNOSIS — R921 Mammographic calcification found on diagnostic imaging of breast: Secondary | ICD-10-CM

## 2017-10-16 DIAGNOSIS — R928 Other abnormal and inconclusive findings on diagnostic imaging of breast: Secondary | ICD-10-CM

## 2017-10-16 DIAGNOSIS — N6320 Unspecified lump in the left breast, unspecified quadrant: Secondary | ICD-10-CM

## 2017-10-23 ENCOUNTER — Ambulatory Visit
Admission: RE | Admit: 2017-10-23 | Discharge: 2017-10-23 | Disposition: A | Discharge status: Home / Self Care | Payer: BLUE CROSS/BLUE SHIELD | Source: Ambulatory Visit | Attending: Family Medicine | Admitting: Family Medicine

## 2017-10-23 DIAGNOSIS — R928 Other abnormal and inconclusive findings on diagnostic imaging of breast: Secondary | ICD-10-CM

## 2017-10-23 DIAGNOSIS — R921 Mammographic calcification found on diagnostic imaging of breast: Secondary | ICD-10-CM | POA: Insufficient documentation

## 2017-10-23 DIAGNOSIS — N6320 Unspecified lump in the left breast, unspecified quadrant: Secondary | ICD-10-CM | POA: Insufficient documentation

## 2017-10-23 DIAGNOSIS — N6310 Unspecified lump in the right breast, unspecified quadrant: Secondary | ICD-10-CM | POA: Insufficient documentation

## 2017-10-24 ENCOUNTER — Other Ambulatory Visit: Payer: Self-pay | Admitting: Family Medicine

## 2017-10-24 DIAGNOSIS — R928 Other abnormal and inconclusive findings on diagnostic imaging of breast: Secondary | ICD-10-CM

## 2017-10-24 DIAGNOSIS — N6320 Unspecified lump in the left breast, unspecified quadrant: Secondary | ICD-10-CM

## 2017-10-24 DIAGNOSIS — N6310 Unspecified lump in the right breast, unspecified quadrant: Secondary | ICD-10-CM

## 2017-10-29 ENCOUNTER — Ambulatory Visit
Admission: RE | Admit: 2017-10-29 | Discharge: 2017-10-29 | Disposition: A | Discharge status: Home / Self Care | Payer: BLUE CROSS/BLUE SHIELD | Source: Ambulatory Visit | Attending: Family Medicine | Admitting: Family Medicine

## 2017-10-29 DIAGNOSIS — N6320 Unspecified lump in the left breast, unspecified quadrant: Secondary | ICD-10-CM

## 2017-10-29 DIAGNOSIS — C50212 Malignant neoplasm of upper-inner quadrant of left female breast: Secondary | ICD-10-CM | POA: Diagnosis not present

## 2017-10-29 DIAGNOSIS — R928 Other abnormal and inconclusive findings on diagnostic imaging of breast: Secondary | ICD-10-CM

## 2017-10-29 DIAGNOSIS — C50211 Malignant neoplasm of upper-inner quadrant of right female breast: Secondary | ICD-10-CM | POA: Insufficient documentation

## 2017-10-29 DIAGNOSIS — N6310 Unspecified lump in the right breast, unspecified quadrant: Secondary | ICD-10-CM

## 2017-10-29 DIAGNOSIS — C773 Secondary and unspecified malignant neoplasm of axilla and upper limb lymph nodes: Secondary | ICD-10-CM | POA: Diagnosis not present

## 2017-10-29 DIAGNOSIS — R590 Localized enlarged lymph nodes: Secondary | ICD-10-CM | POA: Diagnosis present

## 2017-10-29 HISTORY — PX: BREAST BIOPSY: SHX20

## 2017-10-30 ENCOUNTER — Other Ambulatory Visit: Payer: Self-pay | Admitting: Pathology

## 2017-10-31 ENCOUNTER — Telehealth: Payer: Self-pay | Admitting: *Deleted

## 2017-10-31 LAB — SURGICAL PATHOLOGY

## 2017-10-31 NOTE — Telephone Encounter (Addendum)
  Oncology Nurse Navigator Documentation  Navigator Location: [facility] (10/31/17 1500)   )    Abnormal Finding Date: (P) 10/23/17 (10/31/17 1500) Confirmed Diagnosis Date: (P) 10/30/17 (10/31/17 1500)                   Barriers/Navigation Needs: (P) Education;Coordination of Care (10/31/17 1500)    Patient with newly diagnosed breast cancer.  Left her a message to give me a call.  I would like to introduce her to navigation services and schedule surgical and oncology consults.    Patient returned my call.  States she was informed of her positive biopsy results by the radiologist.  States she would like to see Dr. Mike Gip.  Informed patient she was on vacation and would not be back until after Christmas.  Informed patient that we could work her in next week with someone else, but the patient would like to wait to see her.  Appointment scheduled to see Dr. Mike Gip on 11/14/17 @ 8:30.  I will schedule her a surgical consult on Monday when the practices are back in the office.  The patient is to let me know if she has a preference as to who she would like to see.  I will talk to her again on Monday.  States she is fine waiting until after Christmas to see a Psychologist, sport and exercise also.

## 2017-11-04 ENCOUNTER — Ambulatory Visit: Payer: BLUE CROSS/BLUE SHIELD | Admitting: Oncology

## 2017-11-04 ENCOUNTER — Encounter: Payer: Self-pay | Admitting: *Deleted

## 2017-11-12 ENCOUNTER — Ambulatory Visit: Payer: BLUE CROSS/BLUE SHIELD | Admitting: Oncology

## 2017-11-14 ENCOUNTER — Telehealth: Payer: Self-pay | Admitting: *Deleted

## 2017-11-14 ENCOUNTER — Encounter: Payer: Self-pay | Admitting: *Deleted

## 2017-11-14 ENCOUNTER — Inpatient Hospital Stay: Payer: BLUE CROSS/BLUE SHIELD

## 2017-11-14 ENCOUNTER — Other Ambulatory Visit: Payer: Self-pay

## 2017-11-14 ENCOUNTER — Inpatient Hospital Stay: Payer: BLUE CROSS/BLUE SHIELD | Attending: Oncology | Admitting: Hematology and Oncology

## 2017-11-14 ENCOUNTER — Encounter: Payer: Self-pay | Admitting: Hematology and Oncology

## 2017-11-14 DIAGNOSIS — Z8052 Family history of malignant neoplasm of bladder: Secondary | ICD-10-CM | POA: Diagnosis not present

## 2017-11-14 DIAGNOSIS — C50411 Malignant neoplasm of upper-outer quadrant of right female breast: Secondary | ICD-10-CM

## 2017-11-14 DIAGNOSIS — D0512 Intraductal carcinoma in situ of left breast: Secondary | ICD-10-CM | POA: Insufficient documentation

## 2017-11-14 DIAGNOSIS — Z85828 Personal history of other malignant neoplasm of skin: Secondary | ICD-10-CM | POA: Insufficient documentation

## 2017-11-14 DIAGNOSIS — G43909 Migraine, unspecified, not intractable, without status migrainosus: Secondary | ICD-10-CM

## 2017-11-14 DIAGNOSIS — Z87891 Personal history of nicotine dependence: Secondary | ICD-10-CM | POA: Insufficient documentation

## 2017-11-14 DIAGNOSIS — Z801 Family history of malignant neoplasm of trachea, bronchus and lung: Secondary | ICD-10-CM | POA: Insufficient documentation

## 2017-11-14 DIAGNOSIS — Z800 Family history of malignant neoplasm of digestive organs: Secondary | ICD-10-CM | POA: Diagnosis not present

## 2017-11-14 DIAGNOSIS — Z170 Estrogen receptor positive status [ER+]: Secondary | ICD-10-CM | POA: Diagnosis not present

## 2017-11-14 LAB — CBC WITH DIFFERENTIAL/PLATELET
BASOS PCT: 1 %
Basophils Absolute: 0.1 10*3/uL (ref 0–0.1)
Eosinophils Absolute: 0.1 10*3/uL (ref 0–0.7)
Eosinophils Relative: 2 %
HEMATOCRIT: 43.6 % (ref 35.0–47.0)
HEMOGLOBIN: 14.8 g/dL (ref 12.0–16.0)
LYMPHS ABS: 1.6 10*3/uL (ref 1.0–3.6)
Lymphocytes Relative: 32 %
MCH: 29.8 pg (ref 26.0–34.0)
MCHC: 33.9 g/dL (ref 32.0–36.0)
MCV: 87.8 fL (ref 80.0–100.0)
MONO ABS: 0.5 10*3/uL (ref 0.2–0.9)
MONOS PCT: 10 %
NEUTROS ABS: 2.7 10*3/uL (ref 1.4–6.5)
NEUTROS PCT: 55 %
Platelets: 277 10*3/uL (ref 150–440)
RBC: 4.96 MIL/uL (ref 3.80–5.20)
RDW: 13 % (ref 11.5–14.5)
WBC: 4.9 10*3/uL (ref 3.6–11.0)

## 2017-11-14 LAB — COMPREHENSIVE METABOLIC PANEL
ALBUMIN: 4.6 g/dL (ref 3.5–5.0)
ALK PHOS: 70 U/L (ref 38–126)
ALT: 22 U/L (ref 14–54)
ANION GAP: 8 (ref 5–15)
AST: 28 U/L (ref 15–41)
BUN: 19 mg/dL (ref 6–20)
CALCIUM: 9.7 mg/dL (ref 8.9–10.3)
CHLORIDE: 102 mmol/L (ref 101–111)
CO2: 29 mmol/L (ref 22–32)
CREATININE: 0.94 mg/dL (ref 0.44–1.00)
GFR calc non Af Amer: >60 mL/min (ref >60)
GLUCOSE: 101 mg/dL — AB (ref 65–99)
Potassium: 4.1 mmol/L (ref 3.5–5.1)
SODIUM: 139 mmol/L (ref 135–145)
Total Bilirubin: 0.7 mg/dL (ref 0.3–1.2)
Total Protein: 8.2 g/dL — ABNORMAL HIGH (ref 6.5–8.1)

## 2017-11-14 NOTE — Telephone Encounter (Signed)
Called patient to make her aware of her date and time of her   MRI.  Message was left. and also a letter will be mailed out.

## 2017-11-14 NOTE — Progress Notes (Signed)
[facility] day:  11/14/2017  Chief Complaint: Megan Vega is a 61 y.o. female with breast cancer who is referred in consultation by Dr. Tommi Rumps for assessment and management.  HPI: The patient undergoes regular screening mammograms.  Last bilateral screening mammogram on 06/29/2015 revealed no evidence of malignancy. She performs monthly self breast exams.  She has not felt a mass.  Bilateral screening mammogram on 10/14/2017 revealed architectural distortion and possible right axillary adenopathy in the right breast.  There were microcalcifications with a possible associated mass in the left breast. She notes that the RIGHT nipple "looked flatter" after the mammogram. She denies nipple discharge.  Bilateral diagnostic mammogram and ultrasound on 10/23/2017 revealed a 3.9 x 1.7 x 2.2 cm irregular hypoechoic mass with associated areas of vascularity at the 10 o'clock position 3 cm from the nipple and a 1.1 x 0.5 x 0.4 cm area of nodularity (3 approximately 3 mm nodules at 10 o'clock 6 cm from the nipple in the right breast. The mass was within a 5 cm area of pleomorphic microcalcifications.  Ultrasound revealed at least 5 prominent lymph nodes with diffuse thickened and hypoechoic cortices suggestive of metastatic disease.  In the left breast, there was an irregular 1.6 x 1.3 x 1.2 cm hypoechoic mass with indistinct margins at 2 o'clock 3 cm from the nipple.  Axillary ultrasound revealed normal nodes.  She underwent ultrasound guided biopsy on 10/29/2017. Pathology from the right breast at the 10 o'clock position 3 cm from the nipple revealed a 1.4 cm grade II invasive mammary carcinoma of no special type.  Tumor was ER + (> 90%), PR + (90%) and Her2/neu 1+.  Right axillary node biopsy revealed metastatic carcinoma.  Left breast biopsy at the 2 o'clock position 3 cm from the nipple revealed high grade DCIS with comedonecrosis.  Symptomatically,  denies any new symptoms.  She has a history of frequent migraine headaches. She has "allergy related" sinus drainage. She denies fevers, sweats, and weight loss. She has not experienced any vasomotor symptoms since she went through menopause.  She denies any bone pain or abdominal symptoms.  Menarche was at the age of 20. Patient is a Advertising copywriter; reared a son (currently 71) and a daughter (currently.36). First pregnancy was in 1982. Patient breast fed for a short 3 month period with her first child. She was on BCPs for about 30 years. She has never been on HRT. Patient went through menopause at the age of 28.   Patient has a positive family history of lung and bladder cancer (father), and esophageal cancer (mother). There is no known breast or ovarian cancer.   Past Medical History:  Diagnosis Date  . Cancer ([place])    Basal Cell Carcinoma, about 2011  . Cataract   . Hemorrhoids   . Migraines     Past Surgical History:  Procedure Laterality Date  . BREAST BIOPSY Bilateral 10/29/2017   rt core with axilla left core also path pending  . CESAREAN SECTION    . COLONOSCOPY  2012  . MANDIBLE FRACTURE SURGERY      Family History  Problem Relation Age of Onset  . Cancer Mother        Esophageal Cancer  . Early death Mother        Age 65  . Cancer Father        Lung Cancer  . Diabetes Father   . Diabetes Brother  Controlled with diet  . Heart attack Paternal Grandfather     Social History:  reports that she quit smoking about 41 years ago. She has a 2.50 pack-year smoking history. she has never used smokeless tobacco. She reports that she drinks about 0.6 oz of alcohol per week. She reports that she does not use drugs.  Patient is a former smoker for 20 years; only smoked "about 3 cigarettes a day"; stopped 30 years ago. Patient drinks alcohol very infrequently; "about 1 glass a month". Patient currently employed as an Web designer at [employer]. She denies any known  exposures to radiations or toxins.  She lives in [place].  The patient is accompanied by her daughter [name]) and Tanya Nones, RN (nurse navigator)  today.  Allergies:  Allergies  Allergen Reactions  . Peanut-Containing Drug Products     Current Medications: Current Outpatient Medications  Medication Sig Dispense Refill  . b complex vitamins tablet Take 1 tablet by mouth daily.    . Bilberry, Vaccinium myrtillus, (BILBERRY PO) Take by mouth.    . Calcium Carbonate-Vit D-Min (CALCIUM 1200 PO) Take by mouth.    . Flaxseed, Linseed, (FLAX SEED OIL PO) Take by mouth.    . Lactobacillus (PROBIOTIC ACIDOPHILUS PO) Take by mouth.    . magnesium gluconate (MAGONATE) 500 MG tablet Take 500 mg by mouth 2 (two) times daily.    . Multiple Vitamins-Minerals (HAIR/SKIN/NAILS/BIOTIN PO) Take by mouth.    . TURMERIC PO Take by mouth.     No current facility-administered medications for this visit.     Review of Systems:  GENERAL:  Feels "fine".  Active.  No fevers, sweats or weight loss. PERFORMANCE STATUS (ECOG):  0 HEENT:  Allergy symptoms.  No visual changes, runny nose, sore throat, mouth sores or tenderness. Lungs: No shortness of breath or cough.  No hemoptysis. Cardiac:  No chest pain, palpitations, orthopnea, or PND. GI:  No nausea, vomiting, diarrhea, constipation, melena or hematochezia.  Last colonoscopy 2012. GU:  No urgency, frequency, dysuria, or hematuria. Musculoskeletal:  No back pain.  No joint pain.  No muscle tenderness. Extremities:  No pain or swelling. Skin:  No rashes or skin changes. Neuro:  Migraine headaches.  No numbness or weakness, balance or coordination issues. Endocrine:  No diabetes, thyroid issues, hot flashes or night sweats. Psych:  No mood changes, depression or anxiety. Pain:  No focal pain. Review of systems:  All other systems reviewed and found to be negative.  Physical Exam: Blood pressure 132/84, pulse 79, height 5' 4.5" (1.638 m), weight  160 lb 4 oz (72.7 kg). GENERAL:  Well developed, well nourished, woman sitting comfortably in the exam room in no acute distress. MENTAL STATUS:  Alert and oriented to person, place and time. HEAD:  Blonde hair.  Normocephalic, atraumatic, face symmetric, no Cushingoid features. EYES:  Dark green eyes.  Pupils equal round and reactive to light and accomodation.  No conjunctivitis or scleral icterus. ENT:  Oropharynx clear without lesion.  Tongue normal. Mucous membranes moist.  RESPIRATORY:  Clear to auscultation without rales, wheezes or rhonchi. CARDIOVASCULAR:  Regular rate and rhythm without murmur, rub or gallop. BREAST:  Right breast with a 4 x 6 cm mass s/p biopsy in the upper outer quadrant (10 o'clock position) with mild inferior resolving ecchymosis.  No skin changes or nipple discharge.  Left breast with a 4 cm area of fullness s/p biopsy in the upper outer quadrant (2 o'clock).  No skin changes or  nipple discharge.  ABDOMEN:  Soft, non-tender, with active bowel sounds, and no hepatosplenomegaly.  No masses. SKIN:  No rashes, ulcers or lesions. EXTREMITIES: No edema, no skin discoloration or tenderness.  No palpable cords. LYMPH NODES:  1-1.5 cm mobile high right axillary lymph node.  No palpable cervical, supraclavicular, or inguinal adenopathy  NEUROLOGICAL: Unremarkable. PSYCH:  Appropriate.   No visits with results within 3 Day(s) from this visit.  Latest known visit with results is:  Hospital Outpatient Visit on 10/29/2017  Component Date Value Ref Range Status  . SURGICAL PATHOLOGY 10/29/2017    Final-Edited                   Value:Surgical Pathology THIS IS AN ADDENDUM REPORT CASE: [id number] PATIENT: Megan Vega Surgical Pathology Report Addendum  Reason for Addendum #1:  Additional clinical/test information  SPECIMEN SUBMITTED: A. Breast, right, 10:00, 3 CMFN B. Lymph node, right axilla C. Breast, left, 2:00, 3 CMFN  CLINICAL HISTORY: Masses and  adenopathy  PRE-OPERATIVE DIAGNOSIS: High concern for metastatic [facility]  POST-OPERATIVE DIAGNOSIS: None provided.     DIAGNOSIS: A.  RIGHT BREAST, 10:00, 3 CMFN; ULTRASOUND-GUIDED CORE BIOPSY: - INVASIVE MAMMARY CARCINOMA OF NO SPECIAL TYPE.  Size of invasive carcinoma:  1.4 cm in this sample Histologic grade of invasive carcinoma: Grade 2      Glandular/tubular differentiation score: 3      Nuclear pleomorphism score: 3      Mitotic rate score: 1  B.  LYMPH NODE, RIGHT AXILLA; ULTRASOUND-GUIDED CORE BIOPSY: - METASTATIC CARCINOMA OF BREAST ORIGIN.  C.  LEFT BREAST, 2:00, 3 CMFN; ULTRASOUND-GUIDE                         D CORE BIOPSY: - HIGH-GRADE DUCTAL CARCINOMA IN SITU WITH COMEDONECROSIS.  ER/PR/HER2: Immunohistochemistry will be performed with reflex to [place] for HER2 2+ (equivocal) results. The results will be reported in an addendum.  The diagnosis was called to [facility] on 10/30/17 at 105 PM.  GROSS DESCRIPTION:  A. The specimen is received in a formalin-filled container labeled with the patient's name and right breast 10:00, 3 cm from nipple.  Core pieces: 3 Measurement: 1.5-1.8 cm in length and 0.2 cm in diameter Comments: pink to yellow course, marked green  Entirely submitted in cassette(s): 1  Time/Date in fixative: collected and placed in formalin at 12:45 PM on 10/29/2017 Total fixation time: 7.75 hours  B. The specimen is received in a formalin-filled container labeled with the patient's name and right axilla.  Core pieces: multiple, aggregate 2.2 x 0.5 x 0.1 cm Comments: yellow-tan fragments of tissue, marked blue  Entirely submi                         tted in cassette(s): 1  Time/Date in fixative: collected and placed in formalin at 12:56 AM on 10/29/2017 Total fixation time: 7.5 hours  C. The specimen is received in a formalin-filled container labeled with the patient's name and left breast 2:00, 3 cm  nipple.  Core pieces: 3 Measurement: 1.3-1.5 cm in length and 0.2 cm in diameter Comments: yellow-tan cores, marked black  Entirely submitted in cassette(s): 1  Time/Date in fixative: collected and placed in formalin at 1:23 PM on 10/29/2017 Total fixation time: 7 hours    Final Diagnosis performed by Delorse Lek, MD.  Electronically signed 10/30/2017 2:41:01PM   The electronic signature indicates that  the named Attending Pathologist has evaluated the specimen  Technical component performed at [facility], [address] [place], [place] [address] Lab: [phone] Dir: Rush Farmer, MD, MMM  Professional component performed at [facility], [facility], [place], [place], [place] Lab: [phone] Dir: Dellia Nims. Rubinas, MD   Breast Biomarker Reporting Template  BREAST BIOMARKER TESTS Estrogen Receptor (ER) Status: POSITIVE, >90% nuclear staining      Average intensity of staining: Strong Progesterone Receptor (PgR) Status: POSITIVE, 90% nuclear staining      Average intensity of staining: Moderate HER2 (by immunohistochemistry): NEGATIVE, 1+ Percentage of cells with uniform intense complete membrane staining: 0 %  METHODS Cold Ischemia and Fixation Times: Meet requirements specified in latest version of the ASCO/CAP guidelines Testing Performed on Block Number(s): A1 Fixative: Formalin Estrogen Receptor:  FDA cleared ([place])                    Primary Antibody:  SP1 Progesterone Receptor: FDA cleared ([place])                   Primary Antibody: 1E2 HER2 (by immunohistochemistry): FDA approved ([place]KO)                             Primary Antibody: HercepTest  Immunohistochemistry controls worked appropriately. Slides were prepared                          by [facility], [place], [place], and interpreted by Dr. Luana Shu.     Addendum #1 performed by Delorse Lek, MD.   Electronically signed 10/31/2017 12:34:59PM    Technical component performed at [facility], [address], [place], [place] [address] Lab: [phone] Dir: Rush Farmer, MD, MMM  Professional component performed at [facility], [facility], [place], [place], [place] [address] Lab: [phone] Dir: Dellia Nims. Rubinas, MD      Assessment:  Megan Vega is a 61 y.o. female with clinical stage T2N1Mx right breast cancer and DCIS in the left breast s/p ultrasound guided biopsy on 10/29/2017. Pathology from the right breast at the 10 o'clock position 3 cm from the nipple revealed a 1.4 cm grade II invasive mammary carcinoma of no special type.  Tumor was ER + (> 90%), PR + (90%) and Her2/neu 1+.  Right axillary node biopsy revealed metastatic carcinoma.  Left breast biopsy at the 2 o'clock position 3 cm from the nipple revealed high grade DCIS with comedonecrosis.  Bilateral diagnostic mammogram and ultrasound on 10/23/2017 revealed a 3.9 x 1.7 x 2.2 cm irregular hypoechoic mass with associated areas of vascularity at the 10 o'clock position 3 cm from the nipple and a 1.1 x 0.5 x 0.4 cm area of nodularity (3 approximately 3 mm nodules at 10 o'clock 6 cm from the nipple in the right breast. The mass was within a 5 cm area of pleomorphic microcalcifications.  Ultrasound revealed at least 5 prominent lymph nodes with diffuse thickened and hypoechoic cortices suggestive of metastatic disease.  In the left breast, there was an irregular 1.6 x 1.3 x 1.2 cm hypoechoic mass with indistinct margins at 2 o'clock 3 cm from the nipple.  Axillary ultrasound revealed normal nodes.  Symptomatically, she denies any complaints.  Exam reveals a right breast mass with axillary adenopathy and left breast fullness in the upper outer quadrant s/p biopsy.  Plan: 1.  Labs today: CBC with diff, CMP, CA27.29   2.  Discuss recent biopsies revealing invasive mammary carcinoma in the right breast and  DCIS in the left breast.  Discuss 5 cm area of pleomorphic calcifications in the right breast.  Discuss breast MRI to fully assess extent of disease in both breasts.  Discuss diagnosis and treatment of breast cancer.  Given lymph node positive disease (biopsy proven) with 5 lymph nodes suspicious, discuss plans for imaging (PET scan).  Discuss plans for chemotherapy, surgery, hormonal therapy, and possible radiation therapy.  Discuss neoadjuvant versus adjuvant chemotherapy.  Anticipate neoadjuvant chemotherapy, following by surgical resection, possible radiation, then hormonal therapy.  Discussed chemotherapy consisting of dose dense AC (4 cycles every 2 weeks with Neulasta support) followed by Taxol (weekly x 12).   Side effects reviewed.  Discussed plans for pretreatment echo or MUGA.  Discussed bone density in anticipation of an aromatase inhibitor (AI). Side effects reviewed.  Discuss port-a-cath placement.  Discuss chemotherapy class. 3.  Schedule breast MRI. 4.  Schedule PET scan for staging.  5.  Schedule bone density. She notes no prior testing.  6.  Schedule MUGA in anticipation of treatment with an anthracycline.  7.  Discuss surgical consultation with Dr. Bary Castilla on 11/17/2017.  Discuss port-a-cath placement prior to chemotherapy.  8.  Schedule chemotherapy education class once chemotherapy course defined.  9.  RTC on 11/21/2017 for MD assessment, review of results, and discussion regarding direction of therapy.    Lequita Asal, MD  11/14/2017, 9:11 AM   I saw and evaluated the patient, participating in the key portions of the service and reviewing pertinent diagnostic studies and records.  I reviewed the nurse practitioner's note and agree with the findings and the plan.  The assessment and plan were discussed with the patient.  Additional diagnostic studies of PET scan (r/o metastatic disease), MUGA (assess EF), and bone density (prior to AI) are needed and would change the clinical  management.  Multiple questions were asked by the patient and answered.   Nolon Stalls, MD 11/14/2017,9:11 AM

## 2017-11-14 NOTE — Progress Notes (Signed)
  Oncology Nurse Navigator Documentation  Navigator Location: [facility]-Med Onc (11/14/17 1000)   )Navigator Encounter Type: Initial MedOnc (11/14/17 1000)                     Patient Visit Type: MedOnc (11/14/17 1000) Treatment Phase: Pre-Tx/Tx Discussion (11/14/17 1000) Barriers/Navigation Needs: Education (11/14/17 1000) Education: Coping with Diagnosis/ Prognosis;Newly Diagnosed Cancer Education (11/14/17 1000) Interventions: Education (11/14/17 1000)     Education Method: Verbal;Written (11/14/17 1000)  Support Groups/Services: Breast Support Group (11/14/17 1000)             Time Spent with Patient: 120 (11/14/17 1000)   Met with patient and her daughter today during her initial medical oncology consult with Dr. Mike Gip.  Dr. Mike Gip discussed possible neoadjuvant chemo with Adria, Cytoxan and Taxol.  Patient has a PET, a breast MRI scheduled, and multiple other test.  She is scheduled to see Dr. Bary Castilla on Monday.  She is to call if she has any questions or needs.

## 2017-11-15 ENCOUNTER — Encounter: Payer: Self-pay | Admitting: Hematology and Oncology

## 2017-11-15 LAB — CANCER ANTIGEN 27.29: CAN 27.29: 23.5 U/mL (ref 0.0–38.6)

## 2017-11-17 ENCOUNTER — Ambulatory Visit (HOSPITAL_COMMUNITY)
Admission: RE | Admit: 2017-11-17 | Discharge: 2017-11-17 | Disposition: A | Discharge status: Home / Self Care | Payer: BLUE CROSS/BLUE SHIELD | Source: Ambulatory Visit | Attending: Urgent Care | Admitting: Urgent Care

## 2017-11-17 ENCOUNTER — Encounter: Payer: Self-pay | Admitting: General Surgery

## 2017-11-17 ENCOUNTER — Ambulatory Visit (INDEPENDENT_AMBULATORY_CARE_PROVIDER_SITE_OTHER): Payer: BLUE CROSS/BLUE SHIELD | Admitting: General Surgery

## 2017-11-17 VITALS — BP 122/70 | HR 74 | Resp 12 | Ht 65.0 in | Wt 157.0 lb

## 2017-11-17 DIAGNOSIS — D0512 Intraductal carcinoma in situ of left breast: Secondary | ICD-10-CM | POA: Diagnosis not present

## 2017-11-17 DIAGNOSIS — C50411 Malignant neoplasm of upper-outer quadrant of right female breast: Secondary | ICD-10-CM | POA: Diagnosis not present

## 2017-11-17 MED ORDER — GADOBENATE DIMEGLUMINE 529 MG/ML IV SOLN
15.0000 mL | Freq: Once | INTRAVENOUS | Status: AC | PRN
  Administered 2017-11-17: 15 mL via INTRAVENOUS

## 2017-11-17 NOTE — Progress Notes (Addendum)
Patient ID: Megan Vega, female   DOB: 1956/06/22, 61 y.o.   MRN: [id number]  Chief Complaint  Patient presents with  . Other    HPI Megan Vega is a 61 y.o. female who presents for a breast evaluation and discussion of biopsy results. The most recent mammogram was done on 10/23/2017 and biopsy on 10/29/2017. Patient does perform regular self breast checks and gets regular mammograms done. Daughter, Megan Vega is present at visit.   The patient was unaware of any breast problems prior to her recently completed screening mammograms and subsequent cascade of additional imaging studies.   HPI  Past Medical History:  Diagnosis Date  . Cancer ([place])    Basal Cell Carcinoma, about 2011  . Cataract   . Hemorrhoids   . Migraines     Past Surgical History:  Procedure Laterality Date  . BREAST BIOPSY Bilateral 10/29/2017   rt core with axilla left core also path pending  . CESAREAN SECTION    . COLONOSCOPY  2012  . MANDIBLE FRACTURE SURGERY      Family History  Problem Relation Age of Onset  . Cancer Mother        Esophageal Cancer  . Early death Mother        Age 79  . Cancer Father        Lung Cancer  . Diabetes Father   . Diabetes Brother        Controlled with diet  . Heart attack Paternal Grandfather     Social History Social History   Tobacco Use  . Smoking status: Former Smoker    Packs/day: 0.25    Years: 10.00    Pack years: 2.50    Last attempt to quit: 1978    Years since quitting: 41.0  . Smokeless tobacco: Never Used  Substance Use Topics  . Alcohol use: Yes    Alcohol/week: 0.6 oz    Types: 1 Glasses of wine per week  . Drug use: No    Allergies  Allergen Reactions  . Peanut-Containing Drug Products     Current Outpatient Medications  Medication Sig Dispense Refill  . b complex vitamins tablet Take 1 tablet by mouth daily.    . Bilberry, Vaccinium myrtillus, (BILBERRY PO) Take by mouth.    . Calcium Carbonate-Vit D-Min (CALCIUM  1200 PO) Take by mouth.    . Flaxseed, Linseed, (FLAX SEED OIL PO) Take by mouth.    . Lactobacillus (PROBIOTIC ACIDOPHILUS PO) Take by mouth.    . magnesium gluconate (MAGONATE) 500 MG tablet Take 500 mg by mouth 2 (two) times daily.    . Multiple Vitamins-Minerals (HAIR/SKIN/NAILS/BIOTIN PO) Take by mouth.    . TURMERIC PO Take by mouth.     No current facility-administered medications for this visit.     Review of Systems Review of Systems  Constitutional: Negative.   Respiratory: Negative.     Blood pressure 122/70, pulse 74, resp. rate 12, height _0  (1.651 m), weight 157 lb (71.2 kg).  Physical Exam Physical Exam  Constitutional: She is oriented to person, place, and time. She appears well-developed and well-nourished.  Eyes: Conjunctivae are normal. No scleral icterus.  Neck: Neck supple.  Cardiovascular: Normal rate, regular rhythm and normal heart sounds.  Pulmonary/Chest: Effort normal and breath sounds normal. Right breast exhibits no inverted nipple, no mass, no nipple discharge, no skin change and no tenderness. Left breast exhibits no inverted nipple, no mass, no nipple discharge, no skin change and no  tenderness.  Musculoskeletal:       Arms: Lymphadenopathy:    She has no cervical adenopathy.    She has axillary adenopathy (on the right side. Non-tender except as related to recent biopysy. ).  Neurological: She is alert and oriented to person, place, and time.  Skin: Skin is warm and dry.    Data Reviewed Multiple mammographic studies and ultrasounds of the breast from July 03, 2015 completed at [facility] to the current studies of November-December 2018 have been reviewed. Right breast mammogram and ultrasound showed multiple calcifications as well as a irregular mass showing a diameter 3.5 cm.  Associated satellite nodules were identified.  Multiple abnormal right axillary lymph nodes are identified. Left breast ultrasound showed a irregular mass in the  upper outer quadrant of the breast 3 cm from the nipple at 2:00 location measuring up to 1.6 cm in diameter.  October 29, 2017 radiology completed biopsy results:   A. RIGHT BREAST, 10:00, 3 CMFN; ULTRASOUND-GUIDED CORE BIOPSY:  - INVASIVE MAMMARY CARCINOMA OF NO SPECIAL TYPE.   Size of invasive carcinoma: 1.4 cm in this sample  Histologic grade of invasive carcinoma: Grade 2    Glandular/tubular differentiation score: 3    Nuclear pleomorphism score: 3    Mitotic rate score: 1  BREAST BIOMARKER TESTS  Estrogen Receptor (ER) Status: POSITIVE, >90% nuclear staining    Average intensity of staining: Strong  Progesterone Receptor (PgR) Status: POSITIVE, 90% nuclear staining    Average intensity of staining: Moderate  HER2 (by immunohistochemistry): NEGATIVE, 1+   B. LYMPH NODE, RIGHT AXILLA; ULTRASOUND-GUIDED CORE BIOPSY:  - METASTATIC CARCINOMA OF BREAST ORIGIN.   C. LEFT BREAST, 2:00, 3 CMFN; ULTRASOUND-GUIDED CORE BIOPSY:  HIGH-GRADE DUCTAL CARCINOMA IN SITU WITH COMEDONECROSIS.  MRI completed earlier today has been reviewed.  Impression noted below.  IMPRESSION: 1. The patient's known malignancy on the right is centered in the upper outer quadrant but also extends into the lower outer and upper inner quadrants. The body of the mass measures 4.3 x 4.8 x 5 cm. There is a small amount of enhancement posterior to the right nipple which could represent subtle extension. There is a cluster of nodules located posterior to the superior aspect of the known malignancy and another small nodule located posterior to the inferior aspect of the malignancy which could represent satellite lesions. Taking the enhancement posterior to the right nipple and the potential posterior satellite lesions into account, the AP dimension could be as large as 7 cm. 2. The patient's known DCIS spans 5 x 5 x 4.4 cm mammographically based on calcifications. The associated enhancement on  today's study spans 3.2 x 3.2 x 3.3 cm. However, there are 2 small linearly enhancing regions at 6 o'clock in the left breast which could represent further disease. 3. Extensive adenopathy in the right axilla and right retropectoral region. There is at least 1 significantly enlarged right internal mammary lymph node.  Assessment    Extensive invasive cancer involving the right breast and extensive DCIS involving the left breast.  Biopsy-proven metastatic disease to the right axilla.    Plan    I believe this patient would be best served by neoadjuvant chemotherapy.  Based on a review of her imaging studies and her breast volume on clinical exam, she will likely require a mastectomy on the right side and likely have significant distortion on the left and less to contralateral mastectomy is done to encompass all of the known DCIS while on extensions reported  on MRI.  At this time, treatment options for surgery are really secondary to initiation of neoadjuvant chemotherapy.  The role of central venous access was discussed.  She has excellent upper extremity veins, and she might be considered a candidate for therapy without central venous access, although she would be the exception rather than the rule.  The risks associated with central venous access including arterial, pulmonary and venous injury were reviewed. The possible need for additional treatment if pulmonary injury occurs (chest tube placement) was discussed.  The case was reviewed with Dr. Mike Gip from medical oncology after the patient had left the office.  She is in agreement regarding neoadjuvant chemotherapy, and based on plans for anthracycline administration requested central venous access.  The patient will be contacted regarding central venous access completion, working around her known PET scan scheduled for November 24, 2017.    HPI, Physical Exam, Assessment and Plan have been scribed under the direction and in the  presence of Hervey Ard, MD.  Gaspar Cola, CMA  I have completed the exam and reviewed the above documentation for accuracy and completeness.  I agree with the above.  Haematologist has been used and any errors in dictation or transcription are unintentional.  Hervey Ard, M.D., F.A.C.S.  Robert Bellow 11/18/2017, 11:05 AM

## 2017-11-19 ENCOUNTER — Other Ambulatory Visit: Payer: Self-pay | Admitting: General Surgery

## 2017-11-19 ENCOUNTER — Telehealth: Payer: Self-pay | Admitting: *Deleted

## 2017-11-19 ENCOUNTER — Encounter
Admission: RE | Admit: 2017-11-19 | Discharge: 2017-11-19 | Disposition: A | Discharge status: Home / Self Care | Payer: 59 | Source: Ambulatory Visit | Attending: Urgent Care | Admitting: Urgent Care

## 2017-11-19 DIAGNOSIS — Z452 Encounter for adjustment and management of vascular access device: Secondary | ICD-10-CM

## 2017-11-19 DIAGNOSIS — D0512 Intraductal carcinoma in situ of left breast: Secondary | ICD-10-CM | POA: Insufficient documentation

## 2017-11-19 DIAGNOSIS — C50411 Malignant neoplasm of upper-outer quadrant of right female breast: Secondary | ICD-10-CM

## 2017-11-19 MED ORDER — TECHNETIUM TC 99M-LABELED RED BLOOD CELLS IV KIT
23.3200 | PACK | Freq: Once | INTRAVENOUS | Status: AC | PRN
  Administered 2017-11-19: 23.32 via INTRAVENOUS

## 2017-11-19 NOTE — Telephone Encounter (Signed)
Patient's surgery has been scheduled for 11-28-17 at [facility]

## 2017-11-19 NOTE — Telephone Encounter (Signed)
Patient was contacted today to arrange a date for left power port placement.   Possible dates were discussed.   The patient wishes to speak with her daughter first and call the office back to arrange.

## 2017-11-20 ENCOUNTER — Other Ambulatory Visit: Payer: Self-pay | Admitting: *Deleted

## 2017-11-21 ENCOUNTER — Encounter
Admission: RE | Admit: 2017-11-21 | Discharge: 2017-11-21 | Disposition: A | Discharge status: Home / Self Care | Payer: 59 | Source: Ambulatory Visit | Attending: General Surgery | Admitting: General Surgery

## 2017-11-21 ENCOUNTER — Ambulatory Visit
Admission: RE | Admit: 2017-11-21 | Discharge: 2017-11-21 | Disposition: A | Discharge status: Home / Self Care | Payer: Self-pay | Source: Ambulatory Visit | Attending: Urgent Care | Admitting: Urgent Care

## 2017-11-21 ENCOUNTER — Ambulatory Visit: Payer: BLUE CROSS/BLUE SHIELD | Admitting: Hematology and Oncology

## 2017-11-21 ENCOUNTER — Other Ambulatory Visit: Payer: Self-pay

## 2017-11-21 ENCOUNTER — Other Ambulatory Visit: Payer: Self-pay | Admitting: Urgent Care

## 2017-11-21 DIAGNOSIS — D0512 Intraductal carcinoma in situ of left breast: Secondary | ICD-10-CM | POA: Diagnosis not present

## 2017-11-21 DIAGNOSIS — Z01812 Encounter for preprocedural laboratory examination: Secondary | ICD-10-CM | POA: Diagnosis present

## 2017-11-21 DIAGNOSIS — G43909 Migraine, unspecified, not intractable, without status migrainosus: Secondary | ICD-10-CM | POA: Insufficient documentation

## 2017-11-21 DIAGNOSIS — Z85828 Personal history of other malignant neoplasm of skin: Secondary | ICD-10-CM | POA: Diagnosis not present

## 2017-11-21 DIAGNOSIS — L03116 Cellulitis of left lower limb: Secondary | ICD-10-CM | POA: Insufficient documentation

## 2017-11-21 DIAGNOSIS — C50411 Malignant neoplasm of upper-outer quadrant of right female breast: Secondary | ICD-10-CM

## 2017-11-21 HISTORY — DX: Family history of other specified conditions: Z84.89

## 2017-11-21 HISTORY — DX: Personal history of Methicillin resistant Staphylococcus aureus infection: Z86.14

## 2017-11-21 NOTE — Patient Instructions (Signed)
  Your procedure is scheduled on: 11-28-17 FRIDAY Report to Same Day Surgery 2nd floor medical mall [facility] Entrance-take elevator on left to 2nd floor.  Check in with surgery information desk.) To find out your arrival time please call [phone] between 1PM - 3PM on 11-27-17 THURSDAY  Remember: Instructions that are not followed completely may result in serious medical risk, up to and including death, or upon the discretion of your surgeon and anesthesiologist your surgery may need to be rescheduled.    _x___ 1. Do not eat food after midnight the night before your procedure. NO GUM OR CANDY AFTER MIDNIGHT.  You may drink clear liquids up to 2 hours before you are scheduled to arrive at the hospital for your procedure.  Do not drink clear liquids within 2 hours of your scheduled arrival to the hospital.  Clear liquids include  --Water or Apple juice without pulp  --Clear carbohydrate beverage such as ClearFast or Gatorade  --Black Coffee or Clear Tea (No milk, no creamers, do not add anything to  the coffee or Tea    __x__ 2. No Alcohol for 24 hours before or after surgery.   __x__3. No Smoking for 24 prior to surgery.   ____  4. Bring all medications with you on the day of surgery if instructed.    __x__ 5. Notify your doctor if there is any change in your medical condition     (cold, fever, infections).     Do not wear jewelry, make-up, hairpins, clips or nail polish.  Do not wear lotions, powders, or perfumes. You may wear deodorant.  Do not shave 48 hours prior to surgery. Men may shave face and neck.  Do not bring valuables to the hospital.    [facility] is not responsible for any belongings or valuables.               Contacts, dentures or bridgework may not be worn into surgery.  Leave your suitcase in the car. After surgery it may be brought to your room.  For patients admitted to the hospital, discharge time is determined by your treatment team.   Patients  discharged the day of surgery will not be allowed to drive home.  You will need someone to drive you home and stay with you the night of your procedure.    Please read over the following fact sheets that you were given:   [facility] Preparing for Surgery and or MRSA Information   ____ Take anti-hypertensive listed below, cardiac, seizure, asthma, anti-reflux and psychiatric medicines. These include:  1. NONE  2.  3.  4.  5.  6.  ____Fleets enema or Magnesium Citrate as directed.   _x___ Use CHG Soap or sage wipes as directed on instruction sheet   ____ Use inhalers on the day of surgery and bring to hospital day of surgery  ____ Stop Metformin and Janumet 2 days prior to surgery.    ____ Take 1/2 of usual insulin dose the night before surgery and none on the morning surgery.   ____ Follow recommendations from Cardiologist, Pulmonologist or PCP regarding stopping Aspirin, Coumadin, Plavix ,Eliquis, Effient, or Pradaxa, and Pletal.  ____Stop Anti-inflammatories such as Advil, Aleve, Ibuprofen, Motrin, Naproxen, Naprosyn, Goodies powders or aspirin products.  OK to take Tylenol    _x___ Stop supplements until after surgery-STOP BILBERRY, BIOTIN,TURMERIC, AND FLAX SEED OIL NOW-MAY RESUME AFTER SURGERY    ____ Bring C-Pap to the hospital.

## 2017-11-24 ENCOUNTER — Encounter: Payer: Self-pay | Admitting: *Deleted

## 2017-11-24 ENCOUNTER — Encounter
Admission: RE | Admit: 2017-11-24 | Discharge: 2017-11-24 | Disposition: A | Discharge status: Home / Self Care | Payer: 59 | Source: Ambulatory Visit | Attending: General Surgery | Admitting: General Surgery

## 2017-11-24 ENCOUNTER — Telehealth: Payer: Self-pay | Admitting: *Deleted

## 2017-11-24 ENCOUNTER — Other Ambulatory Visit: Payer: Self-pay | Admitting: General Surgery

## 2017-11-24 ENCOUNTER — Encounter
Admission: RE | Admit: 2017-11-24 | Discharge: 2017-11-24 | Disposition: A | Discharge status: Home / Self Care | Payer: 59 | Source: Ambulatory Visit | Attending: Urgent Care | Admitting: Urgent Care

## 2017-11-24 DIAGNOSIS — C50411 Malignant neoplasm of upper-outer quadrant of right female breast: Secondary | ICD-10-CM | POA: Diagnosis present

## 2017-11-24 DIAGNOSIS — D0512 Intraductal carcinoma in situ of left breast: Secondary | ICD-10-CM | POA: Diagnosis present

## 2017-11-24 DIAGNOSIS — Z01812 Encounter for preprocedural laboratory examination: Secondary | ICD-10-CM | POA: Diagnosis not present

## 2017-11-24 LAB — SURGICAL PCR SCREEN
MRSA, PCR: NEGATIVE
STAPHYLOCOCCUS AUREUS: POSITIVE — AB

## 2017-11-24 LAB — GLUCOSE, CAPILLARY: GLUCOSE-CAPILLARY: 101 mg/dL — AB (ref 65–99)

## 2017-11-24 MED ORDER — MUPIROCIN CALCIUM 2 % EX CREA: TOPICAL_CREAM | CUTANEOUS | 0 refills | Status: DC

## 2017-11-24 MED ORDER — FLUDEOXYGLUCOSE F - 18 (FDG) INJECTION
12.0000 | Freq: Once | INTRAVENOUS | Status: AC | PRN
  Administered 2017-11-24: 12.63 via INTRAVENOUS

## 2017-11-24 NOTE — Telephone Encounter (Signed)
-----   Message from Robert Bellow, MD sent at 11/24/2017  3:35 PM EST ----- Please notify the patient I have sent an RX to her pharmacy for some Bactroban ointment that she should apply to the inside of her nose twice day between now and surgery to minimize risk for infection of her port.  ----- Message ----- From: Buel Ream, [facility] [place] Sent: 11/24/2017   3:30 PM To: Robert Bellow, MD

## 2017-11-24 NOTE — Telephone Encounter (Signed)
Message left on cell phone for patient to call the office.  

## 2017-11-24 NOTE — Progress Notes (Signed)
Nasal swab positive for non MRSA. Will RX with mucopurin preop. No change in Ancef for antibiotic prophylaxis.

## 2017-11-24 NOTE — Telephone Encounter (Signed)
Message left on patient's cell phone of detailed message as instructed per Dr. Bary Castilla.   The patient was asked to give a phone follow up to confirm that she received the message and is aware to begin prescription.

## 2017-11-25 ENCOUNTER — Telehealth: Payer: Self-pay | Admitting: *Deleted

## 2017-11-25 NOTE — Telephone Encounter (Signed)
May use neosporin twice a day, pt agrees.

## 2017-11-25 NOTE — Telephone Encounter (Signed)
-----   Message from Robert Bellow, MD sent at 11/24/2017  3:35 PM EST ----- Please notify the patient I have sent an RX to her pharmacy for some Bactroban ointment that she should apply to the inside of her nose twice day between now and surgery to minimize risk for infection of her port.  ----- Message ----- From: Buel Ream, Lab In [place] Sent: 11/24/2017   3:30 PM To: Robert Bellow, MD

## 2017-11-25 NOTE — Telephone Encounter (Signed)
Patient called this morning and states the the Bactroban ointment $300.00. Is there another ointment that you can send in for her that is cheaper.

## 2017-11-27 ENCOUNTER — Encounter: Payer: Self-pay | Admitting: *Deleted

## 2017-11-27 ENCOUNTER — Inpatient Hospital Stay: Payer: 59 | Attending: Hematology and Oncology | Admitting: Hematology and Oncology

## 2017-11-27 ENCOUNTER — Other Ambulatory Visit: Payer: Self-pay | Admitting: Hematology and Oncology

## 2017-11-27 ENCOUNTER — Other Ambulatory Visit: Payer: Self-pay

## 2017-11-27 ENCOUNTER — Encounter: Payer: Self-pay | Admitting: Hematology and Oncology

## 2017-11-27 VITALS — BP 131/79 | HR 72 | Temp 97.9°F | Resp 12 | Ht 65.0 in | Wt 156.0 lb

## 2017-11-27 DIAGNOSIS — Z170 Estrogen receptor positive status [ER+]: Secondary | ICD-10-CM | POA: Insufficient documentation

## 2017-11-27 DIAGNOSIS — D0512 Intraductal carcinoma in situ of left breast: Secondary | ICD-10-CM | POA: Diagnosis not present

## 2017-11-27 DIAGNOSIS — Z8614 Personal history of Methicillin resistant Staphylococcus aureus infection: Secondary | ICD-10-CM

## 2017-11-27 DIAGNOSIS — Z5111 Encounter for antineoplastic chemotherapy: Secondary | ICD-10-CM | POA: Insufficient documentation

## 2017-11-27 DIAGNOSIS — Z800 Family history of malignant neoplasm of digestive organs: Secondary | ICD-10-CM | POA: Insufficient documentation

## 2017-11-27 DIAGNOSIS — C50411 Malignant neoplasm of upper-outer quadrant of right female breast: Secondary | ICD-10-CM

## 2017-11-27 DIAGNOSIS — Z801 Family history of malignant neoplasm of trachea, bronchus and lung: Secondary | ICD-10-CM | POA: Diagnosis not present

## 2017-11-27 DIAGNOSIS — R599 Enlarged lymph nodes, unspecified: Secondary | ICD-10-CM

## 2017-11-27 DIAGNOSIS — Z7189 Other specified counseling: Secondary | ICD-10-CM

## 2017-11-27 DIAGNOSIS — K8020 Calculus of gallbladder without cholecystitis without obstruction: Secondary | ICD-10-CM | POA: Diagnosis not present

## 2017-11-27 DIAGNOSIS — Z87891 Personal history of nicotine dependence: Secondary | ICD-10-CM

## 2017-11-27 DIAGNOSIS — Z23 Encounter for immunization: Secondary | ICD-10-CM | POA: Diagnosis not present

## 2017-11-27 DIAGNOSIS — Z7689 Persons encountering health services in other specified circumstances: Secondary | ICD-10-CM | POA: Diagnosis not present

## 2017-11-27 DIAGNOSIS — K649 Unspecified hemorrhoids: Secondary | ICD-10-CM

## 2017-11-27 DIAGNOSIS — R918 Other nonspecific abnormal finding of lung field: Secondary | ICD-10-CM

## 2017-11-27 DIAGNOSIS — Z8669 Personal history of other diseases of the nervous system and sense organs: Secondary | ICD-10-CM | POA: Diagnosis not present

## 2017-11-27 DIAGNOSIS — Z85828 Personal history of other malignant neoplasm of skin: Secondary | ICD-10-CM | POA: Diagnosis not present

## 2017-11-27 MED ORDER — CEFAZOLIN SODIUM-DEXTROSE 2-4 GM/100ML-% IV SOLN: 2.0000 g | INTRAVENOUS | Status: DC

## 2017-11-27 NOTE — Progress Notes (Signed)
[facility] day:  11/27/2017   Chief Complaint: Megan Vega is a 62 y.o. female with stage IIIB right breast cancer and DCIS of the left breast who is seen for review of interval studies and discussion regarding direction of therapy.  HPI: The patient was last seen in the medical oncology clinic on 11/14/2017 for initial consultation.  She had clinical stage T2N1Mx right breast cancer and DCIS in the left breast s/p ultrasound guided biopsy on 10/29/2017.  Labs were normal.  We discussed additional imaging (breast MRI) as well as staging studies (PET scan).  Bone density study was ordered.  In anticipation of chemotherapy, a MUGA was scheduled.  We discussed port-a-cath placement.  Bilateral breast MRI on 11/17/2017 revealed a 4.3 x 4.8 x 5 cm mass on the right centered in the upper outer quadrant but also extends into the lower outer and upper inner quadrants.  There was a small amount of enhancement posterior to the right nipple which could represent subtle extension. There was a cluster of nodules located posterior to the superior aspect of the known malignancy and another small nodule located posterior to the inferior aspect of the malignancy which could represent satellite lesions. Taking the enhancement posterior to the right nipple and the potential posterior satellite lesions into account, the AP dimension could be as large as 7 cm.  The patient's known DCIS spans 5 x 5 x 4.4 cm mammographically based on calcifications. The associated enhancement spans 3.2 x 3.2 x 3.3 cm. However, there were 2 small linearly enhancing regions at 6 o'clock in the left breast which could represent further disease.  There was extensive adenopathy in the right axilla and right retropectoral region. There was at least 1 significantly enlarged right internal mammary lymph node.  MUGA on 11/19/2017 revealed an EF of 63%.  PET scan on 11/24/2017 revealed hypermetabolic  right axillary (2.6 x 3.1 cm; SUV 8.0), subpectoral/internal mammary (7 mm; SUV 2.4) adenopathy.  There was a 4 mm right lower lobe nodule too small for PET resolution.  There was cholelithiasis.  The patient was seen by Dr. Bary Castilla on 11/17/2017.  Initial discussions included neoadjuvant chemotherapy followed by right sided mastectomy and either left sided lumpectomy or mastectomy.  She is scheduled for port-a-cath placement on 11/28/2017 with Dr. Bary Castilla.  Symptomatically, she is doing fine. There have been no acute changes since her previous visit to the medical clinic. Patient eating well, with no significant demonstrated weight loss.    Past Medical History:  Diagnosis Date  . Cancer ([place])    Basal Cell Carcinoma, about 2011  . Cataract   . Family history of adverse reaction to anesthesia    DAUGHTER-N/V  . Hemorrhoids   . History of methicillin resistant staphylococcus aureus (MRSA) 2015  . Migraines    MIGRAINES    Past Surgical History:  Procedure Laterality Date  . BREAST BIOPSY Bilateral 10/29/2017   rt core with axilla left core also path pending  . CESAREAN SECTION    . COLONOSCOPY  2012  . MANDIBLE FRACTURE SURGERY      Family History  Problem Relation Age of Onset  . Cancer Mother        Esophageal Cancer  . Early death Mother        Age 32  . Cancer Father        Lung Cancer  . Diabetes Father   . Diabetes Brother  Controlled with diet  . Heart attack Paternal Grandfather     Social History:  reports that she quit smoking about 41 years ago. She has a 2.50 pack-year smoking history. she has never used smokeless tobacco. She reports that she drinks about 0.6 oz of alcohol per week. She reports that she does not use drugs.  Patient is a former smoker for 20 years; only smoked "about 3 cigarettes a day"; stopped 30 years ago. Patient drinks alcohol very infrequently; "about 1 glass a month". Patient currently employed as an Web designer at [employer]. She denies any known exposures to radiations or toxins.  She lives in [place].  The patient is accompanied by her daughter [name]) and Tanya Nones, RN (nurse navigator)  today.  Allergies:  Allergies  Allergen Reactions  . Peanut-Containing Drug Products Swelling and Other (See Comments)    Only when eating too many peanuts, swelling with lips and tingly face    Current Medications: Current Outpatient Medications  Medication Sig Dispense Refill  . acetaminophen (TYLENOL) 500 MG tablet Take 1,000 mg by mouth every 6 (six) hours as needed for moderate pain or headache.    . b complex vitamins tablet Take 1 tablet by mouth daily.    . Bilberry, Vaccinium myrtillus, (BILBERRY PO) Take 1 capsule by mouth daily.     . Calcium Carbonate-Vit D-Min (CALCIUM 1200 PO) Take 1 tablet by mouth daily.     . Flaxseed, Linseed, (FLAX SEED OIL PO) Take 1 capsule by mouth daily.     . Lactobacillus (PROBIOTIC ACIDOPHILUS PO) Take 1 capsule by mouth daily.     . magnesium gluconate (MAGONATE) 500 MG tablet Take 500 mg by mouth daily.     . Multiple Vitamins-Minerals (HAIR/SKIN/NAILS/BIOTIN PO) Take 1 tablet by mouth daily.     . mupirocin cream (BACTROBAN) 2 % Apply to affected anterior part of the nose twice a day until surgery. 30 g 0  . TURMERIC PO Take 1 capsule by mouth daily.      No current facility-administered medications for this visit.     Review of Systems:  GENERAL:  Feels "fine".  Active.  No fevers, sweats or weight loss. PERFORMANCE STATUS (ECOG):  0 HEENT:  No visual changes, runny nose, sore throat, mouth sores or tenderness. Lungs: No shortness of breath or cough.  No hemoptysis. Cardiac:  No chest pain, palpitations, orthopnea, or PND. GI:  No nausea, vomiting, diarrhea, constipation, melena or hematochezia.  Last colonoscopy 2012. GU:  No urgency, frequency, dysuria, or hematuria. Musculoskeletal:  No back pain.  No joint pain.  No muscle  tenderness. Extremities:  No pain or swelling. Skin:  No rashes or skin changes. Neuro:  Migraine headaches.  No numbness or weakness, balance or coordination issues. Endocrine:  No diabetes, thyroid issues, hot flashes or night sweats. Psych:  No mood changes, depression or anxiety. Pain:  No focal pain. Review of systems:  All other systems reviewed and found to be negative.  Physical Exam: Blood pressure 131/79, pulse 72, temperature 97.9 F (36.6 C), temperature source Tympanic, resp. rate 12, height 5' 5"  (1.651 m), weight 156 lb (70.8 kg). GENERAL:  Well developed, well nourished, woman sitting comfortably in the exam room in no acute distress. MENTAL STATUS:  Alert and oriented to person, place and time. HEAD:  Blonde hair.  Normocephalic, atraumatic, face symmetric, no Cushingoid features. EYES:  Dark green eyes.  No conjunctivitis or scleral icterus. NEUROLOGICAL: Unremarkable. PSYCH:  Appropriate.  No visits with results within 3 Day(s) from this visit.  Latest known visit with results is:  Hospital Outpatient Visit on 10/29/2017  Component Date Value Ref Range Status  . SURGICAL PATHOLOGY 10/29/2017    Final-Edited                   Value:Surgical Pathology THIS IS AN ADDENDUM REPORT CASE: [id number] PATIENT: Megan Vega Surgical Pathology Report Addendum  Reason for Addendum #1:  Additional clinical/test information  SPECIMEN SUBMITTED: A. Breast, right, 10:00, 3 CMFN B. Lymph node, right axilla C. Breast, left, 2:00, 3 CMFN  CLINICAL HISTORY: Masses and adenopathy  PRE-OPERATIVE DIAGNOSIS: High concern for metastatic [facility]  POST-OPERATIVE DIAGNOSIS: None provided.     DIAGNOSIS: A.  RIGHT BREAST, 10:00, 3 CMFN; ULTRASOUND-GUIDED CORE BIOPSY: - INVASIVE MAMMARY CARCINOMA OF NO SPECIAL TYPE.  Size of invasive carcinoma:  1.4 cm in this sample Histologic grade of invasive carcinoma: Grade 2      Glandular/tubular differentiation score: 3       Nuclear pleomorphism score: 3      Mitotic rate score: 1  B.  LYMPH NODE, RIGHT AXILLA; ULTRASOUND-GUIDED CORE BIOPSY: - METASTATIC CARCINOMA OF BREAST ORIGIN.  C.  LEFT BREAST, 2:00, 3 CMFN; ULTRASOUND-GUIDE                         D CORE BIOPSY: - HIGH-GRADE DUCTAL CARCINOMA IN SITU WITH COMEDONECROSIS.  ER/PR/HER2: Immunohistochemistry will be performed with reflex to [place] for HER2 2+ (equivocal) results. The results will be reported in an addendum.  The diagnosis was called to [facility] on 10/30/17 at 105 PM.  GROSS DESCRIPTION:  A. The specimen is received in a formalin-filled container labeled with the patient's name and right breast 10:00, 3 cm from nipple.  Core pieces: 3 Measurement: 1.5-1.8 cm in length and 0.2 cm in diameter Comments: pink to yellow course, marked green  Entirely submitted in cassette(s): 1  Time/Date in fixative: collected and placed in formalin at 12:45 PM on 10/29/2017 Total fixation time: 7.75 hours  B. The specimen is received in a formalin-filled container labeled with the patient's name and right axilla.  Core pieces: multiple, aggregate 2.2 x 0.5 x 0.1 cm Comments: yellow-tan fragments of tissue, marked blue  Entirely submi                         tted in cassette(s): 1  Time/Date in fixative: collected and placed in formalin at 12:56 AM on 10/29/2017 Total fixation time: 7.5 hours  C. The specimen is received in a formalin-filled container labeled with the patient's name and left breast 2:00, 3 cm nipple.  Core pieces: 3 Measurement: 1.3-1.5 cm in length and 0.2 cm in diameter Comments: yellow-tan cores, marked black  Entirely submitted in cassette(s): 1  Time/Date in fixative: collected and placed in formalin at 1:23 PM on 10/29/2017 Total fixation time: 7 hours    Final Diagnosis performed by Delorse Lek, MD.  Electronically signed 10/30/2017 2:41:01PM   The electronic signature indicates  that the named Attending Pathologist has evaluated the specimen  Technical component performed at [facility], [address] [place], [place] [address] Lab: [phone] Dir: Rush Farmer, MD, MMM  Professional component performed at [facility], [address], [place], [place]  [place] Lab: [phone] Dir: Dellia Nims. Rubinas, MD   Breast Biomarker Reporting Template  BREAST BIOMARKER TESTS Estrogen Receptor (ER) Status: POSITIVE, >90% nuclear staining      Average intensity of staining: Strong Progesterone Receptor (PgR) Status: POSITIVE, 90% nuclear staining      Average intensity of staining: Moderate HER2 (by immunohistochemistry): NEGATIVE, 1+ Percentage of cells with uniform intense complete membrane staining: 0 %  METHODS Cold Ischemia and Fixation Times: Meet requirements specified in latest version of the ASCO/CAP guidelines Testing Performed on Block Number(s): A1 Fixative: Formalin Estrogen Receptor:  FDA cleared ([place])                    Primary Antibody:  SP1 Progesterone Receptor: FDA cleared ([place])                   Primary Antibody: 1E2 HER2 (by immunohistochemistry): FDA approved ([place])                             Primary Antibody: HercepTest  Immunohistochemistry controls worked appropriately. Slides were prepared                          by [facility], [place], [place], and interpreted by Dr. Luana Shu.     Addendum #1 performed by Delorse Lek, MD.  Electronically signed 10/31/2017 12:34:59PM    Technical component performed at [facility], [address], [place], [place] [address] Lab: [phone] Dir: Rush Farmer, MD, MMM  Professional component performed at [facility], [place], [place], [place] [address] Lab: [phone] Dir: Dellia Nims. Rubinas, MD      Assessment:  Megan Vega is a 62 y.o. female with clinical  stage T2N3bM0 right breast cancer and DCIS in the left breast s/p ultrasound guided biopsy on 10/29/2017. Pathology from the right breast at the 10 o'clock position 3 cm from the nipple revealed a 1.4 cm grade II invasive mammary carcinoma of no special type.  Tumor was ER + (> 90%), PR + (90%) and Her2/neu 1+.  Right axillary node biopsy revealed metastatic carcinoma.  Left breast biopsy at the 2 o'clock position 3 cm from the nipple revealed high grade DCIS with comedonecrosis.  CA27.29 was 23.5 on 11/14/2017.  Bilateral diagnostic mammogram and ultrasound on 10/23/2017 revealed a 3.9 x 1.7 x 2.2 cm irregular hypoechoic mass with associated areas of vascularity at the 10 o'clock position 3 cm from the nipple and a 1.1 x 0.5 x 0.4 cm area of nodularity (3 approximately 3 mm nodules at 10 o'clock 6 cm from the nipple in the right breast. The mass was within a 5 cm area of pleomorphic microcalcifications.  Ultrasound revealed at least 5 prominent lymph nodes with diffuse thickened and hypoechoic cortices suggestive of metastatic disease.  In the left breast, there was an irregular 1.6 x 1.3 x 1.2 cm hypoechoic mass with indistinct margins at 2 o'clock 3 cm from the nipple.  Axillary ultrasound revealed normal nodes.  Bilateral breast MRI on 11/17/2017 revealed a 4.3 x 4.8 x 5 cm mass on the right centered in the upper outer quadrant but also extends into the lower outer and upper inner quadrants.  There was a small amount of enhancement posterior to the right nipple which could represent subtle extension. There was a cluster of nodules located posterior to the superior aspect of the known malignancy  and another small nodule located posterior to the inferior aspect of the malignancy which could represent satellite lesions. Taking the enhancement posterior to the right nipple and the potential posterior satellite lesions into account, the AP dimension could be as large as 7 cm.  The patient's known DCIS spans 5 x 5  x 4.4 cm mammographically based on calcifications. The associated enhancement spans 3.2 x 3.2 x 3.3 cm. However, there were 2 small linearly enhancing regions at 6 o'clock in the left breast which could represent further disease.  There was extensive adenopathy in the right axilla and right retropectoral region. There was at least 1 significantly enlarged right internal mammary lymph node.  PET scan on 11/24/2017 revealed hypermetabolic right axillary (2.6 x 3.1 cm; SUV 8.0), subpectoral/internal mammary (7 mm; SUV 2.4) adenopathy.  There was a 4 mm right lower lobe nodule is too small for PET resolution.  There was cholelithiasis.  Symptomatically, she denies any complaints.  Exam reveals a right breast mass with axillary adenopathy and left breast fullness in the upper outer quadrant s/p biopsy.  Plan: 1.   Discuss breast MRI.  Review extent of disease in right breast and left breast.   2.   Discuss PET scan.  No clear evidence of metastatic disease.  Discuss following RLL lung nodule (unclear significance).  Discuss axillary, subpectoral/internal mammary adenopathy. 3.   Discuss MUGA.  EF is normal. 4.   Discuss clinical stage IIIB breast cancer. Anticipate curative intent neoadjuvant chemotherapy, following by surgical resection, radiation, then hormonal therapy. Discussed chemotherapy consisting of dose dense AC (4 cycles every 2 weeks with OnPro Neulasta support) followed by Taxol (weekly x 12). Side effects reviewed.  She wishes to receive chemotherapy on Fridays. 5.   Discussed the potential for Neulasta induced bone pain. Patient educated on need to take concurrent loratadine. Reviewed neutropenic precautions. 6.   Schedule chemotherapy class. 7.   Preauthorize AC chemotherapy and OnPro Neulasta.  8.   Port-a-cath scheduled to be placed on 11/28/2017 by Dr. Bary Castilla. 9.   Discuss clinical trial inclusion. Reviewed UPBEAT study with patient. She is not interested due to the cardiac MRI  requirement. Research RN made aware.  10.  RTC on 12/05/2017 for MD assessment, labs (CBC with diff, CMP, Mg), and cycle #1 AC with OnPro Neulasta support (new).  11.  RTC on 12/12/2017 for nadir MD assessment and labs (CBC with diff, CMP, Mg). 12.  RTC on 12/19/2017 for MD assessment, labs (CBC with diff, CMP, Mg), and cycle #2 AC with OnPro Neulasta support. 13.  RTC on 01/02/2018 for MD assessment, labs (CBC with diff, CMP, Mg), and cycle #3 AC with OnPro Neulasta support. 14.  RTC on 01/16/2018 for MD assessment, labs (CBC with diff, CMP, Mg), and cycle #4 AC with OnPro Neulasta support.   Honor Loh, NP  11/27/2017, 3:46 PM   I saw and evaluated the patient, participating in the key portions of the service and reviewing pertinent diagnostic studies and records.  I reviewed the nurse practitioner's note and agree with the findings and the plan.  The assessment and plan were discussed with the patient.  Multiple questions were asked by the patient and answered.   Nolon Stalls, MD 11/27/2017, 3:46 PM

## 2017-11-27 NOTE — Progress Notes (Signed)
START ON PATHWAY REGIMEN - Breast   Dose-Dense AC q14 days:   A cycle is every 14 days:     Doxorubicin      Cyclophosphamide      [id number]-[id number]   **Always confirm dose/schedule in your pharmacy ordering system**    Paclitaxel 80 mg/m2 Weekly:   Administer weekly:     Paclitaxel   **Always confirm dose/schedule in your pharmacy ordering system**    Patient Characteristics: Preoperative or Nonsurgical Candidate (Clinical Staging), Neoadjuvant Therapy followed by Surgery, Invasive Disease, Chemotherapy, HER2 Negative/Unknown/Equivocal, ER Positive Therapeutic Status: Preoperative or Nonsurgical Candidate (Clinical Staging) AJCC M Category: cM0 AJCC Grade: G2 Breast Surgical Plan: Neoadjuvant Therapy followed by Surgery ER Status: Positive (+) AJCC 8 Stage Grouping: IIIB HER2 Status: Negative (-) AJCC T Category: cT2 AJCC N Category: cN3b PR Status: Positive (+) Intent of Therapy: Curative Intent, Discussed with Patient

## 2017-11-27 NOTE — Progress Notes (Signed)
See follow up note. 

## 2017-11-28 ENCOUNTER — Encounter
Admission: RE | Disposition: A | Discharge status: Home / Self Care | Payer: Self-pay | Source: Ambulatory Visit | Attending: General Surgery

## 2017-11-28 ENCOUNTER — Encounter: Payer: Self-pay | Admitting: *Deleted

## 2017-11-28 ENCOUNTER — Ambulatory Visit: Payer: 59 | Admitting: Certified Registered"

## 2017-11-28 ENCOUNTER — Ambulatory Visit: Payer: 59

## 2017-11-28 ENCOUNTER — Other Ambulatory Visit: Payer: Self-pay

## 2017-11-28 ENCOUNTER — Ambulatory Visit
Admission: RE | Admit: 2017-11-28 | Discharge: 2017-11-28 | Disposition: A | Discharge status: Home / Self Care | Payer: 59 | Source: Ambulatory Visit | Attending: General Surgery | Admitting: General Surgery

## 2017-11-28 DIAGNOSIS — Z8249 Family history of ischemic heart disease and other diseases of the circulatory system: Secondary | ICD-10-CM | POA: Diagnosis not present

## 2017-11-28 DIAGNOSIS — Z85828 Personal history of other malignant neoplasm of skin: Secondary | ICD-10-CM | POA: Insufficient documentation

## 2017-11-28 DIAGNOSIS — C50411 Malignant neoplasm of upper-outer quadrant of right female breast: Secondary | ICD-10-CM | POA: Diagnosis not present

## 2017-11-28 DIAGNOSIS — Z170 Estrogen receptor positive status [ER+]: Secondary | ICD-10-CM | POA: Insufficient documentation

## 2017-11-28 DIAGNOSIS — C50911 Malignant neoplasm of unspecified site of right female breast: Secondary | ICD-10-CM | POA: Diagnosis present

## 2017-11-28 DIAGNOSIS — Z87891 Personal history of nicotine dependence: Secondary | ICD-10-CM | POA: Insufficient documentation

## 2017-11-28 DIAGNOSIS — Z95828 Presence of other vascular implants and grafts: Secondary | ICD-10-CM

## 2017-11-28 DIAGNOSIS — Z8614 Personal history of Methicillin resistant Staphylococcus aureus infection: Secondary | ICD-10-CM | POA: Diagnosis not present

## 2017-11-28 DIAGNOSIS — Z452 Encounter for adjustment and management of vascular access device: Secondary | ICD-10-CM

## 2017-11-28 DIAGNOSIS — C773 Secondary and unspecified malignant neoplasm of axilla and upper limb lymph nodes: Secondary | ICD-10-CM | POA: Diagnosis not present

## 2017-11-28 HISTORY — PX: PORTACATH PLACEMENT: SHX2246

## 2017-11-28 SURGERY — INSERTION, TUNNELED CENTRAL VENOUS DEVICE, WITH PORT
Anesthesia: Monitor Anesthesia Care | Site: Chest | Laterality: Left | Wound class: Clean

## 2017-11-28 MED ORDER — ONDANSETRON HCL 4 MG/2ML IJ SOLN: 4.0000 mg | Freq: Once | INTRAMUSCULAR | Status: DC | PRN

## 2017-11-28 MED ORDER — MIDAZOLAM HCL 2 MG/2ML IJ SOLN
INTRAMUSCULAR | Status: AC
  Filled 2017-11-28: qty 2

## 2017-11-28 MED ORDER — CEFAZOLIN SODIUM-DEXTROSE 2-3 GM-%(50ML) IV SOLR
INTRAVENOUS | Status: DC | PRN
  Administered 2017-11-28: 2 g via INTRAVENOUS

## 2017-11-28 MED ORDER — ONDANSETRON HCL 4 MG/2ML IJ SOLN
INTRAMUSCULAR | Status: DC | PRN
  Administered 2017-11-28: 4 mg via INTRAVENOUS

## 2017-11-28 MED ORDER — SODIUM CHLORIDE 0.9 % IJ SOLN
INTRAMUSCULAR | Status: DC | PRN
  Administered 2017-11-28: 20 mL

## 2017-11-28 MED ORDER — ACETAMINOPHEN 10 MG/ML IV SOLN
INTRAVENOUS | Status: AC
  Filled 2017-11-28: qty 100

## 2017-11-28 MED ORDER — CEFAZOLIN SODIUM-DEXTROSE 2-4 GM/100ML-% IV SOLN
INTRAVENOUS | Status: AC
  Filled 2017-11-28: qty 100

## 2017-11-28 MED ORDER — LIDOCAINE HCL 1 % IJ SOLN
INTRAMUSCULAR | Status: DC | PRN
  Administered 2017-11-28: 10 mL

## 2017-11-28 MED ORDER — SODIUM CHLORIDE 0.9 % IJ SOLN
INTRAMUSCULAR | Status: AC
  Filled 2017-11-28: qty 50

## 2017-11-28 MED ORDER — FAMOTIDINE 20 MG PO TABS
20.0000 mg | ORAL_TABLET | Freq: Once | ORAL | Status: AC
  Administered 2017-11-28: 20 mg via ORAL

## 2017-11-28 MED ORDER — PROPOFOL 500 MG/50ML IV EMUL
INTRAVENOUS | Status: DC | PRN
  Administered 2017-11-28: 120 ug/kg/min via INTRAVENOUS

## 2017-11-28 MED ORDER — FENTANYL CITRATE (PF) 100 MCG/2ML IJ SOLN: 25.0000 ug | INTRAMUSCULAR | Status: DC | PRN

## 2017-11-28 MED ORDER — LACTATED RINGERS IV SOLN
INTRAVENOUS | Status: DC
  Administered 2017-11-28: 10:00:00 via INTRAVENOUS

## 2017-11-28 MED ORDER — LIDOCAINE HCL (CARDIAC) 20 MG/ML IV SOLN
INTRAVENOUS | Status: DC | PRN
  Administered 2017-11-28: 60 mg via INTRAVENOUS

## 2017-11-28 MED ORDER — FENTANYL CITRATE (PF) 100 MCG/2ML IJ SOLN
INTRAMUSCULAR | Status: AC
  Filled 2017-11-28: qty 2

## 2017-11-28 MED ORDER — FENTANYL CITRATE (PF) 100 MCG/2ML IJ SOLN
INTRAMUSCULAR | Status: DC | PRN
  Administered 2017-11-28: 1 ug via INTRAVENOUS

## 2017-11-28 MED ORDER — FAMOTIDINE 20 MG PO TABS
ORAL_TABLET | ORAL | Status: AC
  Administered 2017-11-28: 20 mg via ORAL
  Filled 2017-11-28: qty 1

## 2017-11-28 MED ORDER — MIDAZOLAM HCL 2 MG/2ML IJ SOLN
INTRAMUSCULAR | Status: DC | PRN
  Administered 2017-11-28 (×2): 1 mg via INTRAVENOUS

## 2017-11-28 MED ORDER — ONDANSETRON HCL 4 MG/2ML IJ SOLN
INTRAMUSCULAR | Status: AC
  Filled 2017-11-28: qty 2

## 2017-11-28 SURGICAL SUPPLY — 28 items
BLADE SURG 15 STRL SS SAFETY (BLADE) ×2 IMPLANT
CHLORAPREP W/TINT 26ML (MISCELLANEOUS) ×2 IMPLANT
COVER LIGHT HANDLE STERIS (MISCELLANEOUS) ×5 IMPLANT
DECANTER SPIKE VIAL GLASS SM (MISCELLANEOUS) ×4 IMPLANT
DRAPE C-ARM XRAY 36X54 (DRAPES) ×3 IMPLANT
DRAPE LAPAROTOMY TRNSV 106X77 (MISCELLANEOUS) ×1 IMPLANT
DRSG TEGADERM 2-3/8X2-3/4 SM (GAUZE/BANDAGES/DRESSINGS) ×2 IMPLANT
DRSG TEGADERM 4X4.75 (GAUZE/BANDAGES/DRESSINGS) ×2 IMPLANT
DRSG TELFA 4X3 1S NADH ST (GAUZE/BANDAGES/DRESSINGS) ×2 IMPLANT
ELECT REM PT RETURN 9FT ADLT (ELECTROSURGICAL) ×2
ELECTRODE REM PT RTRN 9FT ADLT (ELECTROSURGICAL) ×1 IMPLANT
GLOVE BIO SURGEON STRL SZ7.5 (GLOVE) ×2 IMPLANT
GLOVE INDICATOR 8.0 STRL GRN (GLOVE) ×2 IMPLANT
GOWN STRL REUS W/ TWL LRG LVL3 (GOWN DISPOSABLE) ×2 IMPLANT
GOWN STRL REUS W/TWL LRG LVL3 (GOWN DISPOSABLE) ×4
KIT PORT POWER 8FR ISP CVUE (Miscellaneous) ×2 IMPLANT
KIT RM TURNOVER STRD PROC AR (KITS) ×2 IMPLANT
LABEL OR SOLS (LABEL) ×2 IMPLANT
NS IRRIG 500ML POUR BTL (IV SOLUTION) ×2 IMPLANT
PACK PORT-A-CATH (MISCELLANEOUS) ×2 IMPLANT
STRIP CLOSURE SKIN 1/2X4 (GAUZE/BANDAGES/DRESSINGS) ×2 IMPLANT
SUT PROLENE 3 0 SH DA (SUTURE) ×2 IMPLANT
SUT VIC AB 3-0 SH 27 (SUTURE) ×2
SUT VIC AB 3-0 SH 27X BRD (SUTURE) ×1 IMPLANT
SUT VIC AB 4-0 FS2 27 (SUTURE) ×2 IMPLANT
SWABSTK COMLB BENZOIN TINCTURE (MISCELLANEOUS) ×2 IMPLANT
SYR 10ML LL (SYRINGE) ×2 IMPLANT
SYR 10ML SLIP (SYRINGE) ×2 IMPLANT

## 2017-11-28 NOTE — Op Note (Signed)
Preoperative diagnosis: Stage II right breast cancer, candidate for neoadjuvant chemotherapy.  Postoperative diagnosis: Same.  Operative procedure: Left subclavian PowerPort placement with ultrasound and fluoroscopic guidance.  Operating Surgeon: Hervey Ard, MD.  Anesthesia: Attended local, 10 cc 1% plain Xylocaine.  Estimated blood loss: Less than 5 cc.  Clinical note: This 62 year old woman was recently diagnosed with a stage II carcinoma of the right breast is felt to be a candidate for neoadjuvant chemotherapy.  Central venous access was requested by the treating oncologist.  Operative note: With the patient under adequate sedation the left chest and neck was cleansed with ChloraPrep and draped.  Ultrasound was used to confirm patency of the left subclavian vein.  Local anesthesia was instilled and well-tolerated.  Under ultrasound guidance the vein was cannulated followed by placement of the guidewire and the dilator.  Under fluoroscopy the catheter tip was positioned just below the junction of the SVC and right atrium were easily irrigated and aspirated.  The cannula was tunneled to a pocket on the left anterior chest.  Port itself was anchored to the deep tissue with 3-0 Prolene sutures x2.  The wound was closed with 3-0 Vicryl to the adipose layer and a running 4-0 Vicryl subcuticular suture for the skin.  The catheter again was aspirated and irrigated easily.  Flushed with 10 cc of injectable saline.  Benzoin, Steri-Strips, Telfa and Tegaderm dressing applied.  Post procedure chest x-ray obtained in the recovery room showed the catheter tip as described above and no evidence of pneumothorax.

## 2017-11-28 NOTE — H&P (Signed)
No change in clinical condition or exam.  For left power port placement.  

## 2017-11-28 NOTE — Anesthesia Preprocedure Evaluation (Signed)
Anesthesia Evaluation  Patient identified by MRN, date of birth, ID band Patient awake    Reviewed: Allergy & Precautions, NPO status , Patient's Chart, lab work & pertinent test results  History of Anesthesia Complications (+) Family history of anesthesia reaction  Airway Mallampati: II  TM Distance: >3 FB     Dental  (+) Teeth Intact   Pulmonary former smoker,    Pulmonary exam normal        Cardiovascular negative cardio ROS Normal cardiovascular exam     Neuro/Psych  Headaches, negative psych ROS   GI/Hepatic   Endo/Other  negative endocrine ROS  Renal/GU negative Renal ROS     Musculoskeletal negative musculoskeletal ROS (+)   Abdominal Normal abdominal exam  (+)   Peds negative pediatric ROS (+)  Hematology negative hematology ROS (+)   Anesthesia Other Findings Past Medical History: No date: [facility]     Comment:  Basal Cell Carcinoma, about 2011 No date: Cataract No date: Family history of adverse reaction to anesthesia     Comment:  DAUGHTER-N/V No date: Hemorrhoids 2015: History of methicillin resistant staphylococcus aureus (MRSA) No date: Migraines     Comment:  MIGRAINES  Reproductive/Obstetrics                             Anesthesia Physical Anesthesia Plan  ASA: II  Anesthesia Plan: General and MAC   Post-op Pain Management:    Induction: Intravenous  PONV Risk Score and Plan: TIVA  Airway Management Planned: Nasal Cannula  Additional Equipment:   Intra-op Plan:   Post-operative Plan:   Informed Consent: I have reviewed the patients History and Physical, chart, labs and discussed the procedure including the risks, benefits and alternatives for the proposed anesthesia with the patient or authorized representative who has indicated his/her understanding and acceptance.   Dental advisory given  Plan Discussed with: Surgeon and CRNA  Anesthesia  Plan Comments:         Anesthesia Quick Evaluation

## 2017-11-28 NOTE — Discharge Instructions (Signed)

## 2017-11-28 NOTE — Progress Notes (Signed)
  Oncology Nurse Navigator Documentation  Navigator Location: [facility] (11/28/17 1300)   )Navigator Encounter Type: Clinic/MDC (11/28/17 1300)                         Barriers/Navigation Needs: No Needs (11/28/17 1300)                          Time Spent with Patient: 45 (11/28/17 1300)   Met with patient and her daughter during her follow-up visit with Dr. Mike Gip.  She is planning to have neoadjuvant chemotherapy with port placement on Friday.  Offered support.  Patient is to call if she has any questions or needs.

## 2017-11-30 ENCOUNTER — Encounter: Payer: Self-pay | Admitting: Hematology and Oncology

## 2017-12-01 ENCOUNTER — Other Ambulatory Visit: Payer: 59

## 2017-12-01 ENCOUNTER — Encounter: Payer: Self-pay | Admitting: General Surgery

## 2017-12-01 NOTE — Patient Instructions (Signed)

## 2017-12-01 NOTE — Transfer of Care (Signed)
Immediate Anesthesia Transfer of Care Note  Patient: Megan Vega  Procedure(s) Performed: INSERTION PORT-A-CATH (Left Chest)  Patient Location: PACU  Anesthesia Type:MAC  Level of Consciousness: awake, alert  and oriented  Airway & Oxygen Therapy: Patient Spontanous Breathing  Post-op Assessment: Report given to RN and Post -op Vital signs reviewed and stable  Post vital signs: Reviewed and stable  Last Vitals:  Vitals:   11/28/17 1344 11/28/17 1429  BP: 138/78 (!) 143/80  Pulse: 67 74  Resp: 15 16  Temp:    SpO2: 100% 100%    Last Pain:  Vitals:   11/28/17 1344  TempSrc:   PainSc: 0-No pain         Complications: No apparent anesthesia complications

## 2017-12-01 NOTE — Addendum Note (Signed)
Addendum  created 12/01/17 1418 by Patience Musca., CRNA   Intraprocedure Attestations filed

## 2017-12-01 NOTE — Anesthesia Postprocedure Evaluation (Signed)
Anesthesia Post Note  Patient: Megan Vega  Procedure(s) Performed: INSERTION PORT-A-CATH (Left Chest)  Patient location during evaluation: PACU Anesthesia Type: MAC and General Level of consciousness: awake and alert and oriented Pain management: pain level controlled Vital Signs Assessment: post-procedure vital signs reviewed and stable Respiratory status: spontaneous breathing Cardiovascular status: blood pressure returned to baseline Anesthetic complications: no     Last Vitals:  Vitals:   11/28/17 1344 11/28/17 1429  BP: 138/78 (!) 143/80  Pulse: 67 74  Resp: 15 16  Temp:    SpO2: 100% 100%    Last Pain:  Vitals:   11/28/17 1344  TempSrc:   PainSc: 0-No pain                 Shawnay Bramel

## 2017-12-01 NOTE — Anesthesia Post-op Follow-up Note (Addendum)
Anesthesia QCDR form completed.        

## 2017-12-02 ENCOUNTER — Other Ambulatory Visit: Payer: Self-pay | Admitting: Urgent Care

## 2017-12-02 ENCOUNTER — Inpatient Hospital Stay: Payer: 59

## 2017-12-02 DIAGNOSIS — C50411 Malignant neoplasm of upper-outer quadrant of right female breast: Secondary | ICD-10-CM

## 2017-12-02 MED ORDER — LORAZEPAM 0.5 MG PO TABS: 0.5000 mg | ORAL_TABLET | Freq: Four times a day (QID) | ORAL | 0 refills | Status: DC | PRN

## 2017-12-02 MED ORDER — LIDOCAINE-PRILOCAINE 2.5-2.5 % EX CREA: TOPICAL_CREAM | CUTANEOUS | 3 refills | Status: DC

## 2017-12-02 MED ORDER — DEXAMETHASONE 4 MG PO TABS: ORAL_TABLET | ORAL | 1 refills | Status: DC

## 2017-12-02 MED ORDER — ONDANSETRON HCL 8 MG PO TABS: 8.0000 mg | ORAL_TABLET | Freq: Two times a day (BID) | ORAL | 1 refills | Status: DC | PRN

## 2017-12-05 ENCOUNTER — Inpatient Hospital Stay (HOSPITAL_BASED_OUTPATIENT_CLINIC_OR_DEPARTMENT_OTHER): Payer: 59 | Admitting: Hematology and Oncology

## 2017-12-05 ENCOUNTER — Inpatient Hospital Stay: Payer: 59

## 2017-12-05 VITALS — BP 128/81 | HR 80 | Temp 97.3°F | Wt 155.4 lb

## 2017-12-05 DIAGNOSIS — R918 Other nonspecific abnormal finding of lung field: Secondary | ICD-10-CM | POA: Diagnosis not present

## 2017-12-05 DIAGNOSIS — Z8669 Personal history of other diseases of the nervous system and sense organs: Secondary | ICD-10-CM

## 2017-12-05 DIAGNOSIS — Z5111 Encounter for antineoplastic chemotherapy: Secondary | ICD-10-CM | POA: Diagnosis not present

## 2017-12-05 DIAGNOSIS — Z7189 Other specified counseling: Secondary | ICD-10-CM

## 2017-12-05 DIAGNOSIS — Z87891 Personal history of nicotine dependence: Secondary | ICD-10-CM | POA: Diagnosis not present

## 2017-12-05 DIAGNOSIS — Z801 Family history of malignant neoplasm of trachea, bronchus and lung: Secondary | ICD-10-CM

## 2017-12-05 DIAGNOSIS — Z7689 Persons encountering health services in other specified circumstances: Secondary | ICD-10-CM

## 2017-12-05 DIAGNOSIS — R599 Enlarged lymph nodes, unspecified: Secondary | ICD-10-CM | POA: Diagnosis not present

## 2017-12-05 DIAGNOSIS — K8020 Calculus of gallbladder without cholecystitis without obstruction: Secondary | ICD-10-CM | POA: Diagnosis not present

## 2017-12-05 DIAGNOSIS — Z23 Encounter for immunization: Secondary | ICD-10-CM

## 2017-12-05 DIAGNOSIS — Z800 Family history of malignant neoplasm of digestive organs: Secondary | ICD-10-CM

## 2017-12-05 DIAGNOSIS — D0512 Intraductal carcinoma in situ of left breast: Secondary | ICD-10-CM | POA: Diagnosis not present

## 2017-12-05 DIAGNOSIS — C50411 Malignant neoplasm of upper-outer quadrant of right female breast: Secondary | ICD-10-CM

## 2017-12-05 DIAGNOSIS — Z85828 Personal history of other malignant neoplasm of skin: Secondary | ICD-10-CM

## 2017-12-05 DIAGNOSIS — K649 Unspecified hemorrhoids: Secondary | ICD-10-CM

## 2017-12-05 DIAGNOSIS — Z170 Estrogen receptor positive status [ER+]: Secondary | ICD-10-CM | POA: Diagnosis not present

## 2017-12-05 DIAGNOSIS — Z8614 Personal history of Methicillin resistant Staphylococcus aureus infection: Secondary | ICD-10-CM

## 2017-12-05 LAB — COMPREHENSIVE METABOLIC PANEL
ALT: 26 U/L (ref 14–54)
AST: 38 U/L (ref 15–41)
Albumin: 4.2 g/dL (ref 3.5–5.0)
Alkaline Phosphatase: 88 U/L (ref 38–126)
Anion gap: 10 (ref 5–15)
BUN: 13 mg/dL (ref 6–20)
CO2: 25 mmol/L (ref 22–32)
Calcium: 9.1 mg/dL (ref 8.9–10.3)
Chloride: 102 mmol/L (ref 101–111)
Creatinine, Ser: 0.94 mg/dL (ref 0.44–1.00)
GFR calc Af Amer: >60 mL/min (ref >60)
GFR calc non Af Amer: >60 mL/min (ref >60)
Glucose, Bld: 151 mg/dL — ABNORMAL HIGH (ref 65–99)
Potassium: 3.9 mmol/L (ref 3.5–5.1)
Sodium: 137 mmol/L (ref 135–145)
Total Bilirubin: 0.6 mg/dL (ref 0.3–1.2)
Total Protein: 7.7 g/dL (ref 6.5–8.1)

## 2017-12-05 LAB — CBC WITH DIFFERENTIAL/PLATELET
Basophils Absolute: 0.1 10*3/uL (ref 0–0.1)
Basophils Relative: 1 %
Eosinophils Absolute: 0.1 10*3/uL (ref 0–0.7)
Eosinophils Relative: 2 %
HCT: 42.2 % (ref 35.0–47.0)
Hemoglobin: 14.2 g/dL (ref 12.0–16.0)
Lymphocytes Relative: 27 %
Lymphs Abs: 1.5 10*3/uL (ref 1.0–3.6)
MCH: 29.8 pg (ref 26.0–34.0)
MCHC: 33.7 g/dL (ref 32.0–36.0)
MCV: 88.2 fL (ref 80.0–100.0)
Monocytes Absolute: 0.5 10*3/uL (ref 0.2–0.9)
Monocytes Relative: 9 %
Neutro Abs: 3.5 10*3/uL (ref 1.4–6.5)
Neutrophils Relative %: 61 %
Platelets: 264 10*3/uL (ref 150–440)
RBC: 4.78 MIL/uL (ref 3.80–5.20)
RDW: 12.6 % (ref 11.5–14.5)
WBC: 5.8 10*3/uL (ref 3.6–11.0)

## 2017-12-05 LAB — MAGNESIUM: Magnesium: 2 mg/dL (ref 1.7–2.4)

## 2017-12-05 MED ORDER — PEGFILGRASTIM 6 MG/0.6ML ~~LOC~~ PSKT
6.0000 mg | PREFILLED_SYRINGE | Freq: Once | SUBCUTANEOUS | Status: AC
  Administered 2017-12-05: 6 mg via SUBCUTANEOUS
  Filled 2017-12-05: qty 0.6

## 2017-12-05 MED ORDER — SODIUM CHLORIDE 0.9 % IV SOLN
Freq: Once | INTRAVENOUS | Status: AC
  Administered 2017-12-05: 10:00:00 via INTRAVENOUS
  Filled 2017-12-05: qty 1000

## 2017-12-05 MED ORDER — HEPARIN SOD (PORK) LOCK FLUSH 100 UNIT/ML IV SOLN: 500.0000 [IU] | Freq: Once | INTRAVENOUS | Status: DC | PRN

## 2017-12-05 MED ORDER — PALONOSETRON HCL INJECTION 0.25 MG/5ML
0.2500 mg | Freq: Once | INTRAVENOUS | Status: AC
  Administered 2017-12-05: 0.25 mg via INTRAVENOUS
  Filled 2017-12-05: qty 5

## 2017-12-05 MED ORDER — DOXORUBICIN HCL CHEMO IV INJECTION 2 MG/ML
60.0000 mg/m2 | Freq: Once | INTRAVENOUS | Status: AC
  Administered 2017-12-05: 108 mg via INTRAVENOUS
  Filled 2017-12-05: qty 54

## 2017-12-05 MED ORDER — INFLUENZA VAC SPLIT QUAD 0.5 ML IM SUSY
0.5000 mL | PREFILLED_SYRINGE | Freq: Once | INTRAMUSCULAR | Status: AC
  Administered 2017-12-05: 0.5 mL via INTRAMUSCULAR
  Filled 2017-12-05: qty 0.5

## 2017-12-05 MED ORDER — SODIUM CHLORIDE 0.9 % IV SOLN
1000.0000 mg | Freq: Once | INTRAVENOUS | Status: AC
  Administered 2017-12-05: 1000 mg via INTRAVENOUS
  Filled 2017-12-05: qty 50

## 2017-12-05 MED ORDER — SODIUM CHLORIDE 0.9% FLUSH
10.0000 mL | INTRAVENOUS | Status: DC | PRN
  Administered 2017-12-05 (×2): 10 mL via INTRAVENOUS
  Filled 2017-12-05: qty 10

## 2017-12-05 MED ORDER — SODIUM CHLORIDE 0.9 % IV SOLN
Freq: Once | INTRAVENOUS | Status: AC
  Administered 2017-12-05: 11:00:00 via INTRAVENOUS
  Filled 2017-12-05: qty 5

## 2017-12-05 MED ORDER — HEPARIN SOD (PORK) LOCK FLUSH 100 UNIT/ML IV SOLN
500.0000 [IU] | Freq: Once | INTRAVENOUS | Status: AC
  Administered 2017-12-05: 500 [IU] via INTRAVENOUS
  Filled 2017-12-05: qty 5

## 2017-12-05 NOTE — Progress Notes (Signed)
Patient offers no concerns today. 

## 2017-12-05 NOTE — Progress Notes (Signed)
[facility] day:  12/05/2017   Chief Complaint: Megan Vega is a 62 y.o. female with stage IIIB right breast cancer and DCIS of the left breast who is seen for assessment prior to cycle #1 AC.  HPI: The patient was last seen in the medical oncology clinic on 11/27/2017.  At that time, she denied any complaints.  Multiple staging studies were discussed.  We discussed the plan for chemotherapy.  Side effects were reviewed.  Chemotherapy class was scheduled.  She underwent port-a-cath placement on 11/28/2017 by Dr. Bary Castilla.  She attended chemotherapy class on 12/02/2017.  During the interim, patient is doing "very well". She denies any acute concerns today. Patient denies fevers, sweats, and weight loss since her last visit.  Weight is down 1 pound. She has not experienced any interval infections.    Past Medical History:  Diagnosis Date  . Cancer ([place])    Basal Cell Carcinoma, about 2011  . Cataract   . Family history of adverse reaction to anesthesia    DAUGHTER-N/V  . Hemorrhoids   . History of methicillin resistant staphylococcus aureus (MRSA) 2015  . Migraines    MIGRAINES    Past Surgical History:  Procedure Laterality Date  . BREAST BIOPSY Bilateral 10/29/2017   rt core with axilla left core also path pending  . CESAREAN SECTION    . COLONOSCOPY  2012  . MANDIBLE FRACTURE SURGERY    . PORTACATH PLACEMENT Left 11/28/2017   Procedure: INSERTION PORT-A-CATH;  Surgeon: Robert Bellow, MD;  Location: [facility] ORS;  Service: General;  Laterality: Left;    Family History  Problem Relation Age of Onset  . Cancer Mother        Esophageal Cancer  . Early death Mother        Age 61  . Cancer Father        Lung Cancer  . Diabetes Father   . Diabetes Brother        Controlled with diet  . Heart attack Paternal Grandfather     Social History:  reports that she quit smoking about 41 years ago. She has a 2.50 pack-year smoking  history. she has never used smokeless tobacco. She reports that she drinks about 0.6 oz of alcohol per week. She reports that she does not use drugs.  Patient is a former smoker for 20 years; only smoked "about 3 cigarettes a day"; stopped 30 years ago. Patient drinks alcohol very infrequently; "about 1 glass a month". Patient currently employed as an Web designer at [employer] She denies any known exposures to radiations or toxins.  She lives in [place].  The patient is accompanied by her daughter [name]) and  today.  Allergies:  Allergies  Allergen Reactions  . Peanut-Containing Drug Products Swelling and Other (See Comments)    Only when eating too many peanuts, swelling with lips and tingly face    Current Medications: Current Outpatient Medications  Medication Sig Dispense Refill  . acetaminophen (TYLENOL) 500 MG tablet Take 1,000 mg by mouth every 6 (six) hours as needed for moderate pain or headache.    . b complex vitamins tablet Take 1 tablet by mouth daily.    . Bilberry, Vaccinium myrtillus, (BILBERRY PO) Take 1 capsule by mouth daily.     . Calcium Carbonate-Vit D-Min (CALCIUM 1200 PO) Take 1 tablet by mouth daily.     Marland Kitchen dexamethasone (DECADRON) 4 MG tablet Take 2 tablets by mouth once  a day on the day after chemotherapy and then take 2 tablets two times a day for 2 days. Take with food. 30 tablet 1  . Flaxseed, Linseed, (FLAX SEED OIL PO) Take 1 capsule by mouth daily.     . Lactobacillus (PROBIOTIC ACIDOPHILUS PO) Take 1 capsule by mouth daily.     Marland Kitchen lidocaine-prilocaine (EMLA) cream Apply to affected area once 30 g 3  . loratadine (CLARITIN) 10 MG tablet Take 10 mg by mouth daily.    Marland Kitchen LORazepam (ATIVAN) 0.5 MG tablet Take 1 tablet (0.5 mg total) by mouth every 6 (six) hours as needed (Nausea or vomiting). 30 tablet 0  . magnesium gluconate (MAGONATE) 500 MG tablet Take 500 mg by mouth daily.     . Multiple Vitamins-Minerals (HAIR/SKIN/NAILS/BIOTIN  PO) Take 1 tablet by mouth daily.     . ondansetron (ZOFRAN) 8 MG tablet Take 1 tablet (8 mg total) by mouth 2 (two) times daily as needed. Start on the third day after chemotherapy. 30 tablet 1  . TURMERIC PO Take 1 capsule by mouth daily.      No current facility-administered medications for this visit.    Facility-Administered Medications Ordered in Other Visits  Medication Dose Route Frequency Provider Last Rate Last Dose  . heparin lock flush 100 unit/mL  500 Units Intravenous Once Corcoran, Melissa C, MD      . Influenza vac split quadrivalent PF (FLUARIX) injection 0.5 mL  0.5 mL Intramuscular Once Honor Loh E, NP      . sodium chloride flush (NS) 0.9 % injection 10 mL  10 mL Intravenous PRN Lequita Asal, MD   10 mL at 12/05/17 0854    Review of Systems:  GENERAL:  Feels "fine".  Active.  No fevers, sweats or weight loss. PERFORMANCE STATUS (ECOG):  0 HEENT:  No visual changes, runny nose, sore throat, mouth sores or tenderness. Lungs: No shortness of breath or cough.  No hemoptysis. Cardiac:  No chest pain, palpitations, orthopnea, or PND. GI:  No nausea, vomiting, diarrhea, constipation, melena or hematochezia.  Last colonoscopy 2012. GU:  No urgency, frequency, dysuria, or hematuria. Musculoskeletal:  No back pain.  No joint pain.  No muscle tenderness. Extremities:  No pain or swelling. Skin:  No rashes or skin changes. Neuro:  Migraine headaches.  No numbness or weakness, balance or coordination issues. Endocrine:  No diabetes, thyroid issues, hot flashes or night sweats. Psych:  No mood changes, depression or anxiety. Pain:  No focal pain. Review of systems:  All other systems reviewed and found to be negative.  Physical Exam: Blood pressure 128/81, pulse 80, temperature (!) 97.3 F (36.3 C), temperature source Tympanic, weight 155 lb 7 oz (70.5 kg). GENERAL:  Well developed, well nourished, woman sitting comfortably in the exam room in no acute  distress. MENTAL STATUS:  Alert and oriented to person, place and time. HEAD:  Blonde hair.  Normocephalic, atraumatic, face symmetric, no Cushingoid features. EYES:  Dark green eyes.  Pupils equal round and reactive to light and accomodation.  No conjunctivitis or scleral icterus. ENT:  Oropharynx clear without lesion.  Tongue normal. Mucous membranes moist.  RESPIRATORY:  Clear to auscultation without rales, wheezes or rhonchi. CARDIOVASCULAR:  Regular rate and rhythm without murmur, rub or gallop. CHEST:  Left sided port-a-cath. ABDOMEN:  Soft, non-tender, with active bowel sounds, and no hepatosplenomegaly.  No masses. SKIN:  No rashes, ulcers or lesions. EXTREMITIES: No edema, no skin discoloration or tenderness.  No palpable  cords. LYMPH NODES:  1-1.5 cm mobile high right axillary lymph node.  No palpable cervical, supraclavicular, or inguinal adenopathy  NEUROLOGICAL: Unremarkable. PSYCH:  Appropriate.   No results found for this or any previous visit (from the past 24 hour(s)).  Recent Results (from the past 2160 hour(s))  Surgical pathology     Status: None   Collection Time: 10/29/17  1:36 PM  Result Value Ref Range   SURGICAL PATHOLOGY      Surgical Pathology THIS IS AN ADDENDUM REPORT CASE: [id number] PATIENT: Audelia Hives Surgical Pathology Report Addendum  Reason for Addendum #1:  Additional clinical/test information  SPECIMEN SUBMITTED: A. Breast, right, 10:00, 3 CMFN B. Lymph node, right axilla C. Breast, left, 2:00, 3 CMFN  CLINICAL HISTORY: Masses and adenopathy  PRE-OPERATIVE DIAGNOSIS: High concern for metastatic [facility]  POST-OPERATIVE DIAGNOSIS: None provided.  DIAGNOSIS: A.  RIGHT BREAST, 10:00, 3 CMFN; ULTRASOUND-GUIDED CORE BIOPSY: - INVASIVE MAMMARY CARCINOMA OF NO SPECIAL TYPE.  Size of invasive carcinoma:  1.4 cm in this sample Histologic grade of invasive carcinoma: Grade 2      Glandular/tubular differentiation score: 3      Nuclear  pleomorphism score: 3      Mitotic rate score: 1  B.  LYMPH NODE, RIGHT AXILLA; ULTRASOUND-GUIDED CORE BIOPSY: - METASTATIC CARCINOMA OF BREAST ORIGIN.  C.  LEFT BREAST, 2:00, 3 CMFN; ULTRASOUND-GUIDE D CORE BIOPSY: - HIGH-GRADE DUCTAL CARCINOMA IN SITU WITH COMEDONECROSIS.  ER/PR/HER2: Immunohistochemistry will be performed with reflex to [place] for HER2 2+ (equivocal) results. The results will be reported in an addendum.  The diagnosis was called to [facility] on 10/30/17 at 105 PM.  GROSS DESCRIPTION:  A. The specimen is received in a formalin-filled container labeled with the patient's name and right breast 10:00, 3 cm from nipple.  Core pieces: 3 Measurement: 1.5-1.8 cm in length and 0.2 cm in diameter Comments: pink to yellow course, marked green  Entirely submitted in cassette(s): 1  Time/Date in fixative: collected and placed in formalin at 12:45 PM on 10/29/2017 Total fixation time: 7.75 hours  B. The specimen is received in a formalin-filled container labeled with the patient's name and right axilla.  Core pieces: multiple, aggregate 2.2 x 0.5 x 0.1 cm Comments: yellow-tan fragments of tissue, marked blue  Entirely submi tted in cassette(s): 1  Time/Date in fixative: collected and placed in formalin at 12:56 AM on 10/29/2017 Total fixation time: 7.5 hours  C. The specimen is received in a formalin-filled container labeled with the patient's name and left breast 2:00, 3 cm nipple.  Core pieces: 3 Measurement: 1.3-1.5 cm in length and 0.2 cm in diameter Comments: yellow-tan cores, marked black  Entirely submitted in cassette(s): 1  Time/Date in fixative: collected and placed in formalin at 1:23 PM on 10/29/2017 Total fixation time: 7 hours   Final Diagnosis performed by Delorse Lek, MD.  Electronically signed 10/30/2017 2:41:01PM  The electronic signature indicates that the named Attending Pathologist has evaluated the  specimen  Technical component performed at [facility], [address] [place], [place] [address] Lab: [phone] Dir: Rush Farmer, MD, MMM  Professional component performed at [facility], [facility], [address], [place], [place]  [address] Lab: [phone] Dir: Dellia Nims. Rubinas, MD   Breast Biomarker Reporting Template  BREAST BIOMARKER TESTS Estrogen Receptor (ER) Status: POSITIVE, >90% nuclear staining      Average intensity of staining: Strong Progesterone Receptor (PgR) Status: POSITIVE, 90% nuclear staining  Average intensity of staining: Moderate HER2 (by immunohistochemistry): NEGATIVE, 1+ Percentage of cells with uniform intense complete membrane staining: 0 %  METHODS Cold Ischemia and Fixation Times: Meet requirements specified in latest version of the ASCO/CAP guidelines Testing Performed on Block Number(s): A1 Fixative: Formalin Estrogen Receptor:  FDA cleared ([place])                    Primary Antibody:  SP1 Progesterone Receptor: FDA cleared ([place])                   Primary Antibody: 1E2 HER2 (by immunohistochemistry): FDA approved ([place])                             Primary Antibody: HercepTest  Immunohistochemistry controls worked appropriately. Slides were prepared  by [facility], [place], [place], and interpreted by Dr. Luana Shu.  Addendum #1 performed by Delorse Lek, MD.  Electronically signed 10/31/2017 12:34:59PM  Technical component performed at [facility], [address] [place], [place] [address] Lab: [phone] Dir: Rush Farmer, MD, MMM  Professional component performed at [facility], [facility], [place], [place], [place] [address] Lab: [phone] Dir: Dellia Nims. Rubinas, MD    Comprehensive metabolic panel     Status: Abnormal   Collection Time: 11/14/17 10:16 AM  Result Value Ref Range   Sodium 139 135 - 145 mmol/L   Potassium 4.1 3.5 - 5.1 mmol/L    Chloride 102 101 - 111 mmol/L   CO2 29 22 - 32 mmol/L   Glucose, Bld 101 (H) 65 - 99 mg/dL   BUN 19 6 - 20 mg/dL   Creatinine, Ser 0.94 0.44 - 1.00 mg/dL   Calcium 9.7 8.9 - 10.3 mg/dL   Total Protein 8.2 (H) 6.5 - 8.1 g/dL   Albumin 4.6 3.5 - 5.0 g/dL   AST 28 15 - 41 U/L   ALT 22 14 - 54 U/L   Alkaline Phosphatase 70 38 - 126 U/L   Total Bilirubin 0.7 0.3 - 1.2 mg/dL   GFR calc non Af Amer >60 >60 mL/min   GFR calc Af Amer >60 >60 mL/min    Comment: (NOTE) The eGFR has been calculated using the CKD EPI equation. This calculation has not been validated in all clinical situations. eGFR's persistently <60 mL/min signify possible Chronic Kidney Disease.    Anion gap 8 5 - 15    Comment: Performed at [facility], [address] [place], [place] [address]  CBC with Differential/Platelet     Status: None   Collection Time: 11/14/17 10:16 AM  Result Value Ref Range   WBC 4.9 3.6 - 11.0 K/uL   RBC 4.96 3.80 - 5.20 MIL/uL   Hemoglobin 14.8 12.0 - 16.0 g/dL   HCT 43.6 35.0 - 47.0 %   MCV 87.8 80.0 - 100.0 fL   MCH 29.8 26.0 - 34.0 pg   MCHC 33.9 32.0 - 36.0 g/dL   RDW 13.0 11.5 - 14.5 %   Platelets 277 150 - 440 K/uL   Neutrophils Relative % 55 %   Neutro Abs 2.7 1.4 - 6.5 K/uL   Lymphocytes Relative 32 %   Lymphs Abs 1.6 1.0 - 3.6 K/uL   Monocytes Relative 10 %   Monocytes Absolute 0.5 0.2 - 0.9 K/uL   Eosinophils Relative 2 %   Eosinophils Absolute 0.1 0 - 0.7 K/uL   Basophils Relative 1 %  Basophils Absolute 0.1 0 - 0.1 K/uL    Comment: Performed at [facility], [address] [place], [place] [address]  Cancer antigen 27.29     Status: None   Collection Time: 11/14/17 10:16 AM  Result Value Ref Range   CA 27.29 23.5 0.0 - 38.6 U/mL    Comment: (NOTE) Bayer Centaur/ACS methodology Performed At: [facility] [place], [place] [id number] Rush Farmer MD [id number] Performed at [facility], [address],  [place], [place] [address]   Surgical pcr screen     Status: Abnormal   Collection Time: 11/24/17  8:40 AM  Result Value Ref Range   MRSA, PCR NEGATIVE NEGATIVE   Staphylococcus aureus POSITIVE (A) NEGATIVE    Comment: (NOTE) The Xpert SA Assay (FDA approved for NASAL specimens in patients 24 years of age and older), is one component of a comprehensive surveillance program. It is not intended to diagnose infection nor to guide or monitor treatment. Performed at [facility], [address] [place], [place] [address]   Glucose, capillary     Status: Abnormal   Collection Time: 11/24/17  9:30 AM  Result Value Ref Range   Glucose-Capillary 101 (H) 65 - 99 mg/dL  Comprehensive metabolic panel     Status: Abnormal   Collection Time: 12/05/17  8:58 AM  Result Value Ref Range   Sodium 137 135 - 145 mmol/L   Potassium 3.9 3.5 - 5.1 mmol/L   Chloride 102 101 - 111 mmol/L   CO2 25 22 - 32 mmol/L   Glucose, Bld 151 (H) 65 - 99 mg/dL   BUN 13 6 - 20 mg/dL   Creatinine, Ser 0.94 0.44 - 1.00 mg/dL   Calcium 9.1 8.9 - 10.3 mg/dL   Total Protein 7.7 6.5 - 8.1 g/dL   Albumin 4.2 3.5 - 5.0 g/dL   AST 38 15 - 41 U/L   ALT 26 14 - 54 U/L   Alkaline Phosphatase 88 38 - 126 U/L   Total Bilirubin 0.6 0.3 - 1.2 mg/dL   GFR calc non Af Amer >60 >60 mL/min   GFR calc Af Amer >60 >60 mL/min    Comment: (NOTE) The eGFR has been calculated using the CKD EPI equation. This calculation has not been validated in all clinical situations. eGFR's persistently <60 mL/min signify possible Chronic Kidney Disease.    Anion gap 10 5 - 15    Comment: Performed at [facility], [address] [place], [place] [address]  CBC with Differential     Status: None   Collection Time: 12/05/17  8:58 AM  Result Value Ref Range   WBC 5.8 3.6 - 11.0 K/uL   RBC 4.78 3.80 - 5.20 MIL/uL   Hemoglobin 14.2 12.0 - 16.0 g/dL   HCT 42.2 35.0 - 47.0 %   MCV 88.2 80.0 - 100.0 fL   MCH 29.8 26.0 - 34.0 pg    MCHC 33.7 32.0 - 36.0 g/dL   RDW 12.6 11.5 - 14.5 %   Platelets 264 150 - 440 K/uL   Neutrophils Relative % 61 %   Neutro Abs 3.5 1.4 - 6.5 K/uL   Lymphocytes Relative 27 %   Lymphs Abs 1.5 1.0 - 3.6 K/uL   Monocytes Relative 9 %   Monocytes Absolute 0.5 0.2 - 0.9 K/uL   Eosinophils Relative 2 %   Eosinophils Absolute 0.1 0 - 0.7 K/uL   Basophils Relative 1 %   Basophils Absolute  0.1 0 - 0.1 K/uL    Comment: Performed at [facility] [address] [place], [place] [address]  Magnesium     Status: None   Collection Time: 12/05/17  8:58 AM  Result Value Ref Range   Magnesium 2.0 1.7 - 2.4 mg/dL    Comment: Performed at [facility], [address], [place], [place] [address]    Assessment:  Shaina Gullatt is a 62 y.o. female with clinical stage [id number] right breast cancer and DCIS in the left breast s/p ultrasound guided biopsy on 10/29/2017. Pathology from the right breast at the 10 o'clock position 3 cm from the nipple revealed a 1.4 cm grade II invasive mammary carcinoma of no special type.  Tumor was ER + (> 90%), PR + (90%) and Her2/neu 1+.  Right axillary node biopsy revealed metastatic carcinoma.  Left breast biopsy at the 2 o'clock position 3 cm from the nipple revealed high grade DCIS with comedonecrosis.  CA27.29 was 23.5 on 11/14/2017.  Bilateral diagnostic mammogram and ultrasound on 10/23/2017 revealed a 3.9 x 1.7 x 2.2 cm irregular hypoechoic mass with associated areas of vascularity at the 10 o'clock position 3 cm from the nipple and a 1.1 x 0.5 x 0.4 cm area of nodularity (3 approximately 3 mm nodules at 10 o'clock 6 cm from the nipple in the right breast. The mass was within a 5 cm area of pleomorphic microcalcifications.  Ultrasound revealed at least 5 prominent lymph nodes with diffuse thickened and hypoechoic cortices suggestive of metastatic disease.  In the left breast, there was an irregular 1.6 x 1.3 x 1.2 cm hypoechoic mass with indistinct margins at 2  o'clock 3 cm from the nipple.  Axillary ultrasound revealed normal nodes.  Bilateral breast MRI on 11/17/2017 revealed a 4.3 x 4.8 x 5 cm mass on the right centered in the upper outer quadrant but also extends into the lower outer and upper inner quadrants.  There was a small amount of enhancement posterior to the right nipple which could represent subtle extension. There was a cluster of nodules located posterior to the superior aspect of the known malignancy and another small nodule located posterior to the inferior aspect of the malignancy which could represent satellite lesions. Taking the enhancement posterior to the right nipple and the potential posterior satellite lesions into account, the AP dimension could be as large as 7 cm.  The patient's known DCIS spans 5 x 5 x 4.4 cm mammographically based on calcifications. The associated enhancement spans 3.2 x 3.2 x 3.3 cm. However, there were 2 small linearly enhancing regions at 6 o'clock in the left breast which could represent further disease.  There was extensive adenopathy in the right axilla and right retropectoral region. There was at least 1 significantly enlarged right internal mammary lymph node.  PET scan on 11/24/2017 revealed hypermetabolic right axillary (2.6 x 3.1 cm; SUV 8.0), subpectoral/internal mammary (7 mm; SUV 2.4) adenopathy.  There was a 4 mm right lower lobe nodule is too small for PET resolution.  There was cholelithiasis.  Symptomatically, she denies any complaints.  Exam reveals a right breast mass with axillary adenopathy and left breast fullness in the upper outer quadrant s/p biopsy.  Plan: 1.  Labs today:  CBC with diff, CMP, Mg. 2.  Discuss plan for chemotherapy.  Potential side effects reviewed again with patient.  Patient consents to beginning treatment. 3.  Labs reviewed. Blood counts stable and adequate for treatment. Proceed with cycle # 1  AC with OnPro Neulasta  today.  4.  Discuss loratadine to prevent bone pain  associated with Neulasta. 5.  Patient asking about influenza vaccination. WBC 5800 with an [place] 3500. Will proceed with vaccination today.  6.  RTC on 12/12/2017 for nadir MD assessment and labs (CBC with diff, BMP).   Honor Loh, NP  12/05/2017, 9:40 AM   I saw and evaluated the patient, participating in the key portions of the service and reviewing pertinent diagnostic studies and records.  I reviewed the nurse practitioner's note and agree with the findings and the plan.  The assessment and plan were discussed with the patient.  Multiple questions were asked by the patient and answered.   Nolon Stalls, MD 12/05/2017, 9:40 AM

## 2017-12-05 NOTE — Progress Notes (Signed)
blood return noted before, every 3 cc during, and after Adriamycin push.  Pt tolerated infusion well. Pt stable at  Discharge.

## 2017-12-06 ENCOUNTER — Encounter: Payer: Self-pay | Admitting: Hematology and Oncology

## 2017-12-12 ENCOUNTER — Telehealth: Payer: Self-pay | Admitting: *Deleted

## 2017-12-12 ENCOUNTER — Inpatient Hospital Stay (HOSPITAL_BASED_OUTPATIENT_CLINIC_OR_DEPARTMENT_OTHER): Payer: 59 | Admitting: Hematology and Oncology

## 2017-12-12 ENCOUNTER — Inpatient Hospital Stay: Payer: 59

## 2017-12-12 ENCOUNTER — Other Ambulatory Visit: Payer: 59

## 2017-12-12 VITALS — BP 136/85 | HR 76 | Temp 98.6°F | Resp 18 | Wt 152.5 lb

## 2017-12-12 DIAGNOSIS — Z87891 Personal history of nicotine dependence: Secondary | ICD-10-CM | POA: Diagnosis not present

## 2017-12-12 DIAGNOSIS — Z801 Family history of malignant neoplasm of trachea, bronchus and lung: Secondary | ICD-10-CM

## 2017-12-12 DIAGNOSIS — K649 Unspecified hemorrhoids: Secondary | ICD-10-CM

## 2017-12-12 DIAGNOSIS — Z85828 Personal history of other malignant neoplasm of skin: Secondary | ICD-10-CM

## 2017-12-12 DIAGNOSIS — D0512 Intraductal carcinoma in situ of left breast: Secondary | ICD-10-CM | POA: Diagnosis not present

## 2017-12-12 DIAGNOSIS — Z5111 Encounter for antineoplastic chemotherapy: Secondary | ICD-10-CM

## 2017-12-12 DIAGNOSIS — Z170 Estrogen receptor positive status [ER+]: Secondary | ICD-10-CM

## 2017-12-12 DIAGNOSIS — R918 Other nonspecific abnormal finding of lung field: Secondary | ICD-10-CM | POA: Diagnosis not present

## 2017-12-12 DIAGNOSIS — R599 Enlarged lymph nodes, unspecified: Secondary | ICD-10-CM

## 2017-12-12 DIAGNOSIS — Z8669 Personal history of other diseases of the nervous system and sense organs: Secondary | ICD-10-CM

## 2017-12-12 DIAGNOSIS — Z7689 Persons encountering health services in other specified circumstances: Secondary | ICD-10-CM | POA: Diagnosis not present

## 2017-12-12 DIAGNOSIS — C50411 Malignant neoplasm of upper-outer quadrant of right female breast: Secondary | ICD-10-CM

## 2017-12-12 DIAGNOSIS — Z8614 Personal history of Methicillin resistant Staphylococcus aureus infection: Secondary | ICD-10-CM

## 2017-12-12 DIAGNOSIS — Z7189 Other specified counseling: Secondary | ICD-10-CM

## 2017-12-12 DIAGNOSIS — T451X5A Adverse effect of antineoplastic and immunosuppressive drugs, initial encounter: Secondary | ICD-10-CM

## 2017-12-12 DIAGNOSIS — D701 Agranulocytosis secondary to cancer chemotherapy: Secondary | ICD-10-CM

## 2017-12-12 DIAGNOSIS — K8020 Calculus of gallbladder without cholecystitis without obstruction: Secondary | ICD-10-CM

## 2017-12-12 DIAGNOSIS — Z23 Encounter for immunization: Secondary | ICD-10-CM

## 2017-12-12 DIAGNOSIS — Z800 Family history of malignant neoplasm of digestive organs: Secondary | ICD-10-CM

## 2017-12-12 LAB — CBC WITH DIFFERENTIAL/PLATELET
Basophils Absolute: 0 10*3/uL (ref 0–0.1)
Basophils Relative: 2 %
Eosinophils Absolute: 0.2 10*3/uL (ref 0–0.7)
Eosinophils Relative: 10 %
HCT: 38.6 % (ref 35.0–47.0)
Hemoglobin: 13.1 g/dL (ref 12.0–16.0)
Lymphocytes Relative: 58 %
Lymphs Abs: 1 10*3/uL (ref 1.0–3.6)
MCH: 29.6 pg (ref 26.0–34.0)
MCHC: 33.9 g/dL (ref 32.0–36.0)
MCV: 87.3 fL (ref 80.0–100.0)
Monocytes Absolute: 0.3 10*3/uL (ref 0.2–0.9)
Monocytes Relative: 18 %
Neutro Abs: 0.2 10*3/uL — ABNORMAL LOW (ref 1.4–6.5)
Neutrophils Relative %: 12 %
Platelets: 172 10*3/uL (ref 150–440)
RBC: 4.41 MIL/uL (ref 3.80–5.20)
RDW: 12.5 % (ref 11.5–14.5)
WBC: 1.7 10*3/uL — ABNORMAL LOW (ref 3.6–11.0)

## 2017-12-12 LAB — BASIC METABOLIC PANEL
Anion gap: 10 (ref 5–15)
BUN: 14 mg/dL (ref 6–20)
CO2: 25 mmol/L (ref 22–32)
Calcium: 8.6 mg/dL — ABNORMAL LOW (ref 8.9–10.3)
Chloride: 98 mmol/L — ABNORMAL LOW (ref 101–111)
Creatinine, Ser: 0.7 mg/dL (ref 0.44–1.00)
GFR calc Af Amer: >60 mL/min (ref >60)
GFR calc non Af Amer: >60 mL/min (ref >60)
Glucose, Bld: 85 mg/dL (ref 65–99)
Potassium: 3.9 mmol/L (ref 3.5–5.1)
Sodium: 133 mmol/L — ABNORMAL LOW (ref 135–145)

## 2017-12-12 NOTE — Telephone Encounter (Signed)
Critical ANC - 0.2  MD notified.

## 2017-12-12 NOTE — Progress Notes (Signed)
Patient states she was nauseated on the 5th day after treatrment.  She did not take on 3rd day.

## 2017-12-12 NOTE — Progress Notes (Signed)
[facility] day:  12/12/2017   Chief Complaint: Megan Vega is a 62 y.o. female with stage IIIB right breast cancer and DCIS of the left breast who is seen for nadir assessment on day 8 s/p cycle #1 AC.  HPI: The patient was last seen in the medical oncology clinic on 12/05/2017.  At that time, she was doing well.  She received cycle #1 AC with OnPro Neulasta.  She received the influenza vaccine.  During the interim, patient has done fairly well with her initial cycle of chemotherapy. Patient experienced some progressive nausea on about day 3. She notes that she feels like she took her antiemetics too late, citing that her nausea had already gotten bad before she used the prescribed interventions. Patient has lost 3 pounds.     She tried to go back to work on Monday, 12/08/2017, but was extremely tired and had a headache.  She worked on Tuesday and felt bad at the end of the day.  Wednesday she had nausea that "flattened me".   She was unable to eat.  Thursday and Friday this week, she has been better.  Patient denies fevers and sweats. She denies pain.    Past Medical History:  Diagnosis Date  . Cancer ([place])    Basal Cell Carcinoma, about 2011  . Cataract   . Family history of adverse reaction to anesthesia    DAUGHTER-N/V  . Hemorrhoids   . History of methicillin resistant staphylococcus aureus (MRSA) 2015  . Migraines    MIGRAINES    Past Surgical History:  Procedure Laterality Date  . BREAST BIOPSY Bilateral 10/29/2017   rt core with axilla left core also path pending  . CESAREAN SECTION    . COLONOSCOPY  2012  . MANDIBLE FRACTURE SURGERY    . PORTACATH PLACEMENT Left 11/28/2017   Procedure: INSERTION PORT-A-CATH;  Surgeon: Robert Bellow, MD;  Location: [facility] ORS;  Service: General;  Laterality: Left;    Family History  Problem Relation Age of Onset  . Cancer Mother        Esophageal Cancer  . Early death Mother         Age 41  . Cancer Father        Lung Cancer  . Diabetes Father   . Diabetes Brother        Controlled with diet  . Heart attack Paternal Grandfather     Social History:  reports that she quit smoking about 41 years ago. She has a 2.50 pack-year smoking history. she has never used smokeless tobacco. She reports that she drinks about 0.6 oz of alcohol per week. She reports that she does not use drugs.  Patient is a former smoker for 20 years; only smoked "about 3 cigarettes a day"; stopped 30 years ago. Patient drinks alcohol very infrequently; "about 1 glass a month". Patient currently employed as an Web designer at [employer] She denies any known exposures to radiations or toxins.  She lives in [place].  The patient is accompanied by her daughter [name]) and  today.  Allergies:  Allergies  Allergen Reactions  . Peanut-Containing Drug Products Swelling and Other (See Comments)    Only when eating too many peanuts, swelling with lips and tingly face    Current Medications: Current Outpatient Medications  Medication Sig Dispense Refill  . acetaminophen (TYLENOL) 500 MG tablet Take 1,000 mg by mouth every 6 (six) hours as needed for moderate pain  or headache.    . b complex vitamins tablet Take 1 tablet by mouth daily.    . Bilberry, Vaccinium myrtillus, (BILBERRY PO) Take 1 capsule by mouth daily.     . Calcium Carbonate-Vit D-Min (CALCIUM 1200 PO) Take 1 tablet by mouth daily.     Marland Kitchen dexamethasone (DECADRON) 4 MG tablet Take 2 tablets by mouth once a day on the day after chemotherapy and then take 2 tablets two times a day for 2 days. Take with food. 30 tablet 1  . Flaxseed, Linseed, (FLAX SEED OIL PO) Take 1 capsule by mouth daily.     . Lactobacillus (PROBIOTIC ACIDOPHILUS PO) Take 1 capsule by mouth daily.     Marland Kitchen lidocaine-prilocaine (EMLA) cream Apply to affected area once 30 g 3  . loratadine (CLARITIN) 10 MG tablet Take 10 mg by mouth daily.    Marland Kitchen LORazepam  (ATIVAN) 0.5 MG tablet Take 1 tablet (0.5 mg total) by mouth every 6 (six) hours as needed (Nausea or vomiting). 30 tablet 0  . magnesium gluconate (MAGONATE) 500 MG tablet Take 500 mg by mouth daily.     . Multiple Vitamins-Minerals (HAIR/SKIN/NAILS/BIOTIN PO) Take 1 tablet by mouth daily.     . ondansetron (ZOFRAN) 8 MG tablet Take 1 tablet (8 mg total) by mouth 2 (two) times daily as needed. Start on the third day after chemotherapy. 30 tablet 1  . TURMERIC PO Take 1 capsule by mouth daily.      No current facility-administered medications for this visit.     Review of Systems:  GENERAL:  Feels "better".  No fevers or sweats. Weight down 3 pounds.  PERFORMANCE STATUS (ECOG):  1 HEENT:  No visual changes, runny nose, sore throat, mouth sores or tenderness. Lungs: No shortness of breath or cough.  No hemoptysis. Cardiac:  No chest pain, palpitations, orthopnea, or PND. GI:  Interval nausea, resolved.  No vomiting, diarrhea, constipation, melena or hematochezia.  Last colonoscopy 2012. GU:  No urgency, frequency, dysuria, or hematuria. Musculoskeletal:  No back pain.  No joint pain.  No muscle tenderness. Extremities:  No pain or swelling. Skin:  No rashes or skin changes. Neuro:  Migraine headaches.  No numbness or weakness, balance or coordination issues. Endocrine:  No diabetes, thyroid issues, hot flashes or night sweats. Psych:  No mood changes, depression or anxiety. Pain:  No focal pain. Review of systems:  All other systems reviewed and found to be negative.  Physical Exam: Blood pressure 136/85, pulse 76, temperature 98.6 F (37 C), temperature source Tympanic, resp. rate 18, weight 152 lb 8 oz (69.2 kg). GENERAL:  Well developed, well nourished, woman sitting comfortably in the exam room in no acute distress. MENTAL STATUS:  Alert and oriented to person, place and time. HEAD:  Blonde hair.  Normocephalic, atraumatic, face symmetric, no Cushingoid features. EYES:  Dark green  eyes.  Pupils equal round and reactive to light and accomodation.  No conjunctivitis or scleral icterus. ENT:  Oropharynx clear without lesion.  Tongue normal. Mucous membranes moist.  RESPIRATORY:  Clear to auscultation without rales, wheezes or rhonchi. CARDIOVASCULAR:  Regular rate and rhythm without murmur, rub or gallop. CHEST:  Left sided port-a-cath. ABDOMEN:  Soft, non-tender, with active bowel sounds, and no hepatosplenomegaly.  No masses. SKIN:  No rashes, ulcers or lesions. EXTREMITIES: No edema, no skin discoloration or tenderness.  No palpable cords. LYMPH NODES:  Small mobile high right axillary lymph node.  No palpable cervical, supraclavicular, or inguinal adenopathy  NEUROLOGICAL: Unremarkable. PSYCH:  Appropriate.   Results for orders placed or performed in visit on 12/12/17 (from the past 24 hour(s))  Basic metabolic panel     Status: Abnormal   Collection Time: 12/12/17  2:05 PM  Result Value Ref Range   Sodium 133 (L) 135 - 145 mmol/L   Potassium 3.9 3.5 - 5.1 mmol/L   Chloride 98 (L) 101 - 111 mmol/L   CO2 25 22 - 32 mmol/L   Glucose, Bld 85 65 - 99 mg/dL   BUN 14 6 - 20 mg/dL   Creatinine, Ser 0.70 0.44 - 1.00 mg/dL   Calcium 8.6 (L) 8.9 - 10.3 mg/dL   GFR calc non Af Amer >60 >60 mL/min   GFR calc Af Amer >60 >60 mL/min   Anion gap 10 5 - 15  CBC with Differential     Status: Abnormal   Collection Time: 12/12/17  2:05 PM  Result Value Ref Range   WBC 1.7 (L) 3.6 - 11.0 K/uL   RBC 4.41 3.80 - 5.20 MIL/uL   Hemoglobin 13.1 12.0 - 16.0 g/dL   HCT 38.6 35.0 - 47.0 %   MCV 87.3 80.0 - 100.0 fL   MCH 29.6 26.0 - 34.0 pg   MCHC 33.9 32.0 - 36.0 g/dL   RDW 12.5 11.5 - 14.5 %   Platelets 172 150 - 440 K/uL   Neutrophils Relative % 12 %   Neutro Abs 0.2 (L) 1.4 - 6.5 K/uL   Lymphocytes Relative 58 %   Lymphs Abs 1.0 1.0 - 3.6 K/uL   Monocytes Relative 18 %   Monocytes Absolute 0.3 0.2 - 0.9 K/uL   Eosinophils Relative 10 %   Eosinophils Absolute 0.2 0 -  0.7 K/uL   Basophils Relative 2 %   Basophils Absolute 0.0 0 - 0.1 K/uL    Recent Results (from the past 2160 hour(s))  Surgical pathology     Status: None   Collection Time: 10/29/17  1:36 PM  Result Value Ref Range   SURGICAL PATHOLOGY      Surgical Pathology THIS IS AN ADDENDUM REPORT CASE: [id number] PATIENT: Audelia Hives Surgical Pathology Report Addendum  Reason for Addendum #1:  Additional clinical/test information  SPECIMEN SUBMITTED: A. Breast, right, 10:00, 3 CMFN B. Lymph node, right axilla C. Breast, left, 2:00, 3 CMFN  CLINICAL HISTORY: Masses and adenopathy  PRE-OPERATIVE DIAGNOSIS: High concern for metastatic [facility]  POST-OPERATIVE DIAGNOSIS: None provided.  DIAGNOSIS: A.  RIGHT BREAST, 10:00, 3 CMFN; ULTRASOUND-GUIDED CORE BIOPSY: - INVASIVE MAMMARY CARCINOMA OF NO SPECIAL TYPE.  Size of invasive carcinoma:  1.4 cm in this sample Histologic grade of invasive carcinoma: Grade 2      Glandular/tubular differentiation score: 3      Nuclear pleomorphism score: 3      Mitotic rate score: 1  B.  LYMPH NODE, RIGHT AXILLA; ULTRASOUND-GUIDED CORE BIOPSY: - METASTATIC CARCINOMA OF BREAST ORIGIN.  C.  LEFT BREAST, 2:00, 3 CMFN; ULTRASOUND-GUIDE D CORE BIOPSY: - HIGH-GRADE DUCTAL CARCINOMA IN SITU WITH COMEDONECROSIS.  ER/PR/HER2: Immunohistochemistry will be performed with reflex to [place] for HER2 2+ (equivocal) results. The results will be reported in an addendum.  The diagnosis was called to [place] [facility] [place] on 10/30/17 at 105 PM.  GROSS DESCRIPTION:  A. The specimen is received in a formalin-filled container labeled with the patient's name and right breast 10:00, 3 cm from nipple.  Core pieces: 3 Measurement: 1.5-1.8 cm in length and 0.2 cm in  diameter Comments: pink to yellow course, marked green  Entirely submitted in cassette(s): 1  Time/Date in fixative: collected and placed in formalin at 12:45 PM  on 10/29/2017 Total fixation time: 7.75 hours  B. The specimen is received in a formalin-filled container labeled with the patient's name and right axilla.  Core pieces: multiple, aggregate 2.2 x 0.5 x 0.1 cm Comments: yellow-tan fragments of tissue, marked blue  Entirely submi tted in cassette(s): 1  Time/Date in fixative: collected and placed in formalin at 12:56 AM on 10/29/2017 Total fixation time: 7.5 hours  C. The specimen is received in a formalin-filled container labeled with the patient's name and left breast 2:00, 3 cm nipple.  Core pieces: 3 Measurement: 1.3-1.5 cm in length and 0.2 cm in diameter Comments: yellow-tan cores, marked black  Entirely submitted in cassette(s): 1  Time/Date in fixative: collected and placed in formalin at 1:23 PM on 10/29/2017 Total fixation time: 7 hours   Final Diagnosis performed by Delorse Lek, MD.  Electronically signed 10/30/2017 2:41:01PM  The electronic signature indicates that the named Attending Pathologist has evaluated the specimen  Technical component performed at [facility], [address], [place], [place] [address] Lab: [phone] Dir: Rush Farmer, MD, MMM  Professional component performed at [facility], [facility], [place], [place], [place]  [address] Lab: [phone] Dir: Dellia Nims. Rubinas, MD   Breast Biomarker Reporting Template  BREAST BIOMARKER TESTS Estrogen Receptor (ER) Status: POSITIVE, >90% nuclear staining      Average intensity of staining: Strong Progesterone Receptor (PgR) Status: POSITIVE, 90% nuclear staining      Average intensity of staining: Moderate HER2 (by immunohistochemistry): NEGATIVE, 1+ Percentage of cells with uniform intense complete membrane staining: 0 %  METHODS Cold Ischemia and Fixation Times: Meet requirements specified in latest version of the ASCO/CAP guidelines Testing Performed on Block Number(s): A1 Fixative: Formalin Estrogen Receptor:   FDA cleared ([place])                    Primary Antibody:  SP1 Progesterone Receptor: FDA cleared ([place])                   Primary Antibody: 1E2 HER2 (by immunohistochemistry): FDA approved ([place])                             Primary Antibody: HercepTest  Immunohistochemistry controls worked appropriately. Slides were prepared  by [facility], [place], [place], and interpreted by Dr. Luana Shu.  Addendum #1 performed by Delorse Lek, MD.  Electronically signed 10/31/2017 12:34:59PM  Technical component performed at [facility], [address] [place], [place] [address] Lab: [phone] Dir: Rush Farmer, MD, MMM  Professional component performed at [facility], [facility], [place], [place], [place] [address] Lab: [phone] Dir: Dellia Nims. Rubinas, MD    Comprehensive metabolic panel     Status: Abnormal   Collection Time: 11/14/17 10:16 AM  Result Value Ref Range   Sodium 139 135 - 145 mmol/L   Potassium 4.1 3.5 - 5.1 mmol/L   Chloride 102 101 - 111 mmol/L   CO2 29 22 - 32 mmol/L   Glucose, Bld 101 (H) 65 - 99 mg/dL   BUN 19 6 - 20 mg/dL   Creatinine, Ser 0.94 0.44 - 1.00 mg/dL   Calcium 9.7 8.9 - 10.3 mg/dL   Total Protein 8.2 (H) 6.5 - 8.1 g/dL   Albumin 4.6 3.5 -  5.0 g/dL   AST 28 15 - 41 U/L   ALT 22 14 - 54 U/L   Alkaline Phosphatase 70 38 - 126 U/L   Total Bilirubin 0.7 0.3 - 1.2 mg/dL   GFR calc non Af Amer >60 >60 mL/min   GFR calc Af Amer >60 >60 mL/min    Comment: (NOTE) The eGFR has been calculated using the CKD EPI equation. This calculation has not been validated in all clinical situations. eGFR's persistently <60 mL/min signify possible Chronic Kidney Disease.    Anion gap 8 5 - 15    Comment: Performed at [facility], [address] [place], [place] [address]  CBC with Differential/Platelet     Status: None   Collection Time: 11/14/17 10:16 AM  Result Value Ref Range   WBC 4.9 3.6 - 11.0 K/uL    RBC 4.96 3.80 - 5.20 MIL/uL   Hemoglobin 14.8 12.0 - 16.0 g/dL   HCT 43.6 35.0 - 47.0 %   MCV 87.8 80.0 - 100.0 fL   MCH 29.8 26.0 - 34.0 pg   MCHC 33.9 32.0 - 36.0 g/dL   RDW 13.0 11.5 - 14.5 %   Platelets 277 150 - 440 K/uL   Neutrophils Relative % 55 %   Neutro Abs 2.7 1.4 - 6.5 K/uL   Lymphocytes Relative 32 %   Lymphs Abs 1.6 1.0 - 3.6 K/uL   Monocytes Relative 10 %   Monocytes Absolute 0.5 0.2 - 0.9 K/uL   Eosinophils Relative 2 %   Eosinophils Absolute 0.1 0 - 0.7 K/uL   Basophils Relative 1 %   Basophils Absolute 0.1 0 - 0.1 K/uL    Comment: Performed at [facility], [address] [place], [place] [address]  Cancer antigen 27.29     Status: None   Collection Time: 11/14/17 10:16 AM  Result Value Ref Range   CA 27.29 23.5 0.0 - 38.6 U/mL    Comment: (NOTE) Bayer Centaur/ACS methodology Performed At: [facility] [place], [place] [id number] Rush Farmer MD [phone] Performed at [facility], [address], [place], [place] [address]   Surgical pcr screen     Status: Abnormal   Collection Time: 11/24/17  8:40 AM  Result Value Ref Range   MRSA, PCR NEGATIVE NEGATIVE   Staphylococcus aureus POSITIVE (A) NEGATIVE    Comment: (NOTE) The Xpert SA Assay (FDA approved for NASAL specimens in patients 20 years of age and older), is one component of a comprehensive surveillance program. It is not intended to diagnose infection nor to guide or monitor treatment. Performed at [facility], [address] [place],  [address]   Glucose, capillary     Status: Abnormal   Collection Time: 11/24/17  9:30 AM  Result Value Ref Range   Glucose-Capillary 101 (H) 65 - 99 mg/dL  Comprehensive metabolic panel     Status: Abnormal   Collection Time: 12/05/17  8:58 AM  Result Value Ref Range   Sodium 137 135 - 145 mmol/L   Potassium 3.9 3.5 - 5.1 mmol/L   Chloride 102 101 - 111 mmol/L   CO2 25 22 - 32 mmol/L    Glucose, Bld 151 (H) 65 - 99 mg/dL   BUN 13 6 - 20 mg/dL   Creatinine, Ser 0.94 0.44 - 1.00 mg/dL   Calcium 9.1 8.9 - 10.3 mg/dL   Total Protein 7.7 6.5 - 8.1 g/dL   Albumin 4.2 3.5 - 5.0 g/dL  AST 38 15 - 41 U/L   ALT 26 14 - 54 U/L   Alkaline Phosphatase 88 38 - 126 U/L   Total Bilirubin 0.6 0.3 - 1.2 mg/dL   GFR calc non Af Amer >60 >60 mL/min   GFR calc Af Amer >60 >60 mL/min    Comment: (NOTE) The eGFR has been calculated using the CKD EPI equation. This calculation has not been validated in all clinical situations. eGFR's persistently <60 mL/min signify possible Chronic Kidney Disease.    Anion gap 10 5 - 15    Comment: Performed at [facility], [address] [place], [place] [address]  CBC with Differential     Status: None   Collection Time: 12/05/17  8:58 AM  Result Value Ref Range   WBC 5.8 3.6 - 11.0 K/uL   RBC 4.78 3.80 - 5.20 MIL/uL   Hemoglobin 14.2 12.0 - 16.0 g/dL   HCT 42.2 35.0 - 47.0 %   MCV 88.2 80.0 - 100.0 fL   MCH 29.8 26.0 - 34.0 pg   MCHC 33.7 32.0 - 36.0 g/dL   RDW 12.6 11.5 - 14.5 %   Platelets 264 150 - 440 K/uL   Neutrophils Relative % 61 %   Neutro Abs 3.5 1.4 - 6.5 K/uL   Lymphocytes Relative 27 %   Lymphs Abs 1.5 1.0 - 3.6 K/uL   Monocytes Relative 9 %   Monocytes Absolute 0.5 0.2 - 0.9 K/uL   Eosinophils Relative 2 %   Eosinophils Absolute 0.1 0 - 0.7 K/uL   Basophils Relative 1 %   Basophils Absolute 0.1 0 - 0.1 K/uL    Comment: Performed at [facility], [address] [place], [place] [address]  Magnesium     Status: None   Collection Time: 12/05/17  8:58 AM  Result Value Ref Range   Magnesium 2.0 1.7 - 2.4 mg/dL    Comment: Performed at [facility], [address] [place], [place] [address]    Assessment:  Megan Vega is a 62 y.o. female with clinical stage [id number] right breast cancer and DCIS in the left breast s/p ultrasound guided biopsy on 10/29/2017. Pathology from the right breast at the 10  o'clock position 3 cm from the nipple revealed a 1.4 cm grade II invasive mammary carcinoma of no special type.  Tumor was ER + (> 90%), PR + (90%) and Her2/neu 1+.  Right axillary node biopsy revealed metastatic carcinoma.  Left breast biopsy at the 2 o'clock position 3 cm from the nipple revealed high grade DCIS with comedonecrosis.  CA27.29 was 23.5 on 11/14/2017.  Bilateral diagnostic mammogram and ultrasound on 10/23/2017 revealed a 3.9 x 1.7 x 2.2 cm irregular hypoechoic mass with associated areas of vascularity at the 10 o'clock position 3 cm from the nipple and a 1.1 x 0.5 x 0.4 cm area of nodularity (3 approximately 3 mm nodules at 10 o'clock 6 cm from the nipple in the right breast. The mass was within a 5 cm area of pleomorphic microcalcifications.  Ultrasound revealed at least 5 prominent lymph nodes with diffuse thickened and hypoechoic cortices suggestive of metastatic disease.  In the left breast, there was an irregular 1.6 x 1.3 x 1.2 cm hypoechoic mass with indistinct margins at 2 o'clock 3 cm from the nipple.  Axillary ultrasound revealed normal nodes.  Bilateral breast MRI on 11/17/2017 revealed a 4.3 x 4.8 x 5 cm mass on the right centered in the upper outer quadrant but  also extends into the lower outer and upper inner quadrants.  There was a small amount of enhancement posterior to the right nipple which could represent subtle extension. There was a cluster of nodules located posterior to the superior aspect of the known malignancy and another small nodule located posterior to the inferior aspect of the malignancy which could represent satellite lesions. Taking the enhancement posterior to the right nipple and the potential posterior satellite lesions into account, the AP dimension could be as large as 7 cm.  The patient's known DCIS spans 5 x 5 x 4.4 cm mammographically based on calcifications. The associated enhancement spans 3.2 x 3.2 x 3.3 cm. However, there were 2 small linearly  enhancing regions at 6 o'clock in the left breast which could represent further disease.  There was extensive adenopathy in the right axilla and right retropectoral region. There was at least 1 significantly enlarged right internal mammary lymph node.  PET scan on 11/24/2017 revealed hypermetabolic right axillary (2.6 x 3.1 cm; SUV 8.0), subpectoral/internal mammary (7 mm; SUV 2.4) adenopathy.  There was a 4 mm right lower lobe nodule is too small for PET resolution.  There was cholelithiasis.  She is day 8 s/p cycle #1 AC (12/05/2017) with OnPro Neulasta support.  Symptomatically, she is doing well overall at this point. She had some nausea associated with her first cycle of chemotherapy.  Exam reveals a right breast mass with axillary adenopathy and left breast fullness in the upper outer quadrant s/p biopsy. WBC  Is 1700 with an ANC of 200.  Plan: 1.  Labs today:  CBC with diff, BMP. 2.  Discuss nausea. Discuss symptom due to CINV or dehydration. Patient encouraged to increase fluid intake and used prescribed interventions.  3.  Discuss neutropenic precautions. Patient with a WBC of 1700 with ANC of 200. Reinforced neutropenic precautions.    4.  Discuss loratadine to prevent bone pain associated with Neulasta. 5.  RTC in 1 week for MD assessment, labs (CBC with diff, CMP, Mg) and cycle #2 AC.   Honor Loh, NP  12/12/2017, 3:27 PM   I saw and evaluated the patient, participating in the key portions of the service and reviewing pertinent diagnostic studies and records.  I reviewed the nurse practitioner's note and agree with the findings and the plan.  The assessment and plan were discussed with the patient.  Multiple questions were asked by the patient and answered.   Nolon Stalls, MD 12/12/2017, 3:27 PM

## 2017-12-14 ENCOUNTER — Encounter: Payer: Self-pay | Admitting: Hematology and Oncology

## 2017-12-16 ENCOUNTER — Ambulatory Visit
Admission: RE | Admit: 2017-12-16 | Discharge: 2017-12-16 | Disposition: A | Discharge status: Home / Self Care | Payer: 59 | Source: Ambulatory Visit | Attending: Urgent Care | Admitting: Urgent Care

## 2017-12-16 DIAGNOSIS — M85852 Other specified disorders of bone density and structure, left thigh: Secondary | ICD-10-CM | POA: Diagnosis not present

## 2017-12-16 DIAGNOSIS — Z13820 Encounter for screening for osteoporosis: Secondary | ICD-10-CM | POA: Insufficient documentation

## 2017-12-16 DIAGNOSIS — D0512 Intraductal carcinoma in situ of left breast: Secondary | ICD-10-CM | POA: Diagnosis present

## 2017-12-16 DIAGNOSIS — C50411 Malignant neoplasm of upper-outer quadrant of right female breast: Secondary | ICD-10-CM

## 2017-12-19 ENCOUNTER — Encounter: Payer: Self-pay | Admitting: Hematology and Oncology

## 2017-12-19 ENCOUNTER — Inpatient Hospital Stay (HOSPITAL_BASED_OUTPATIENT_CLINIC_OR_DEPARTMENT_OTHER): Payer: 59 | Admitting: Hematology and Oncology

## 2017-12-19 ENCOUNTER — Other Ambulatory Visit: Payer: Self-pay

## 2017-12-19 ENCOUNTER — Inpatient Hospital Stay: Payer: 59

## 2017-12-19 ENCOUNTER — Inpatient Hospital Stay: Payer: 59 | Attending: Hematology and Oncology

## 2017-12-19 ENCOUNTER — Encounter: Payer: Self-pay | Admitting: *Deleted

## 2017-12-19 VITALS — BP 132/76 | HR 73 | Temp 98.5°F | Resp 18 | Wt 156.1 lb

## 2017-12-19 DIAGNOSIS — Z7689 Persons encountering health services in other specified circumstances: Secondary | ICD-10-CM | POA: Insufficient documentation

## 2017-12-19 DIAGNOSIS — Z85828 Personal history of other malignant neoplasm of skin: Secondary | ICD-10-CM

## 2017-12-19 DIAGNOSIS — C50411 Malignant neoplasm of upper-outer quadrant of right female breast: Secondary | ICD-10-CM

## 2017-12-19 DIAGNOSIS — M8580 Other specified disorders of bone density and structure, unspecified site: Secondary | ICD-10-CM | POA: Insufficient documentation

## 2017-12-19 DIAGNOSIS — Z800 Family history of malignant neoplasm of digestive organs: Secondary | ICD-10-CM | POA: Insufficient documentation

## 2017-12-19 DIAGNOSIS — Z8614 Personal history of Methicillin resistant Staphylococcus aureus infection: Secondary | ICD-10-CM

## 2017-12-19 DIAGNOSIS — Z5111 Encounter for antineoplastic chemotherapy: Secondary | ICD-10-CM | POA: Insufficient documentation

## 2017-12-19 DIAGNOSIS — D709 Neutropenia, unspecified: Secondary | ICD-10-CM | POA: Insufficient documentation

## 2017-12-19 DIAGNOSIS — Z79899 Other long term (current) drug therapy: Secondary | ICD-10-CM | POA: Insufficient documentation

## 2017-12-19 DIAGNOSIS — Z95828 Presence of other vascular implants and grafts: Secondary | ICD-10-CM

## 2017-12-19 DIAGNOSIS — Z87891 Personal history of nicotine dependence: Secondary | ICD-10-CM | POA: Insufficient documentation

## 2017-12-19 DIAGNOSIS — Z801 Family history of malignant neoplasm of trachea, bronchus and lung: Secondary | ICD-10-CM | POA: Insufficient documentation

## 2017-12-19 DIAGNOSIS — D0512 Intraductal carcinoma in situ of left breast: Secondary | ICD-10-CM | POA: Insufficient documentation

## 2017-12-19 DIAGNOSIS — Z170 Estrogen receptor positive status [ER+]: Secondary | ICD-10-CM | POA: Diagnosis not present

## 2017-12-19 DIAGNOSIS — R5383 Other fatigue: Secondary | ICD-10-CM | POA: Diagnosis not present

## 2017-12-19 DIAGNOSIS — Z9221 Personal history of antineoplastic chemotherapy: Secondary | ICD-10-CM | POA: Insufficient documentation

## 2017-12-19 DIAGNOSIS — C50419 Malignant neoplasm of upper-outer quadrant of unspecified female breast: Secondary | ICD-10-CM

## 2017-12-19 DIAGNOSIS — M85852 Other specified disorders of bone density and structure, left thigh: Secondary | ICD-10-CM

## 2017-12-19 LAB — CBC WITH DIFFERENTIAL/PLATELET
Basophils Absolute: 0.1 10*3/uL (ref 0–0.1)
Basophils Relative: 2 %
Eosinophils Absolute: 0 10*3/uL (ref 0–0.7)
Eosinophils Relative: 1 %
HCT: 38.7 % (ref 35.0–47.0)
Hemoglobin: 13 g/dL (ref 12.0–16.0)
Lymphocytes Relative: 20 %
Lymphs Abs: 1.1 10*3/uL (ref 1.0–3.6)
MCH: 29.5 pg (ref 26.0–34.0)
MCHC: 33.5 g/dL (ref 32.0–36.0)
MCV: 87.8 fL (ref 80.0–100.0)
Monocytes Absolute: 0.5 10*3/uL (ref 0.2–0.9)
Monocytes Relative: 10 %
Neutro Abs: 3.6 10*3/uL (ref 1.4–6.5)
Neutrophils Relative %: 67 %
Platelets: 238 10*3/uL (ref 150–440)
RBC: 4.41 MIL/uL (ref 3.80–5.20)
RDW: 13.1 % (ref 11.5–14.5)
WBC: 5.3 10*3/uL (ref 3.6–11.0)

## 2017-12-19 LAB — COMPREHENSIVE METABOLIC PANEL
ALT: 51 U/L (ref 14–54)
AST: 29 U/L (ref 15–41)
Albumin: 3.8 g/dL (ref 3.5–5.0)
Alkaline Phosphatase: 135 U/L — ABNORMAL HIGH (ref 38–126)
Anion gap: 7 (ref 5–15)
BUN: 11 mg/dL (ref 6–20)
CO2: 26 mmol/L (ref 22–32)
Calcium: 8.6 mg/dL — ABNORMAL LOW (ref 8.9–10.3)
Chloride: 104 mmol/L (ref 101–111)
Creatinine, Ser: 0.72 mg/dL (ref 0.44–1.00)
GFR calc Af Amer: >60 mL/min (ref >60)
GFR calc non Af Amer: >60 mL/min (ref >60)
Glucose, Bld: 123 mg/dL — ABNORMAL HIGH (ref 65–99)
Potassium: 4.1 mmol/L (ref 3.5–5.1)
Sodium: 137 mmol/L (ref 135–145)
Total Bilirubin: 0.3 mg/dL (ref 0.3–1.2)
Total Protein: 7 g/dL (ref 6.5–8.1)

## 2017-12-19 LAB — MAGNESIUM: Magnesium: 2.1 mg/dL (ref 1.7–2.4)

## 2017-12-19 MED ORDER — HEPARIN SOD (PORK) LOCK FLUSH 100 UNIT/ML IV SOLN
500.0000 [IU] | Freq: Once | INTRAVENOUS | Status: AC
  Administered 2017-12-19: 500 [IU] via INTRAVENOUS
  Filled 2017-12-19: qty 5

## 2017-12-19 NOTE — Progress Notes (Signed)
Here for follow up   Stated port site tender to touch ,light red mottled areas noted. Will share w Dr Mike Gip to note.

## 2017-12-19 NOTE — Progress Notes (Signed)
[facility] day:  12/19/2017   Chief Complaint: Megan Vega is a 62 y.o. female with stage IIIB right breast cancer and DCIS of the left breast who is seen for assessment prior to cycle #2 AC.  HPI: The patient was last seen in the medical oncology clinic on 12/12/2017.  At that time, she described some nausea associated with her first cycle of chemotherapy.  She was tired but continued to work.  Labs revealed a WBC of 1700 with an ANC of 200.  Bone density on 12/16/2016 revealed osteopenia with a T-score of -1.6 in the left femoral neck on 12/16/2017.  Symptomatically, she has done well following her chemotherapy. Patient continues to work her full time job without significant difficulties. Patient has not experienced any nausea, vomiting, or weight loss. She is eating well. Her weight has increased by 4 pounds. Patient denies B symptoms and interval infections.   Patient complaints of pain (tenderness) to her port-a-cath site. Site is "red". Patient notes that it has been intermittently tender. She notes that the pain has increased significantly today despite the use of the prescribed EMLA cream. She adamantly denies having a fever.    Past Medical History:  Diagnosis Date  . Cancer ([place])    Basal Cell Carcinoma, about 2011  . Cataract   . Family history of adverse reaction to anesthesia    DAUGHTER-N/V  . Hemorrhoids   . History of methicillin resistant staphylococcus aureus (MRSA) 2015  . Migraines    MIGRAINES    Past Surgical History:  Procedure Laterality Date  . BREAST BIOPSY Bilateral 10/29/2017   rt core with axilla left core also path pending  . CESAREAN SECTION    . COLONOSCOPY  2012  . MANDIBLE FRACTURE SURGERY    . PORTACATH PLACEMENT Left 11/28/2017   Procedure: INSERTION PORT-A-CATH;  Surgeon: Robert Bellow, MD;  Location: [facility] ORS;  Service: General;  Laterality: Left;    Family History  Problem Relation Age  of Onset  . Cancer Mother        Esophageal Cancer  . Early death Mother        Age 17  . Cancer Father        Lung Cancer  . Diabetes Father   . Diabetes Brother        Controlled with diet  . Heart attack Paternal Grandfather     Social History:  reports that she quit smoking about 41 years ago. She has a 2.50 pack-year smoking history. she has never used smokeless tobacco. She reports that she drinks about 0.6 oz of alcohol per week. She reports that she does not use drugs.  Patient is a former smoker for 20 years; only smoked "about 3 cigarettes a day"; stopped 30 years ago. Patient drinks alcohol very infrequently; "about 1 glass a month". Patient currently employed as an Web designer at [employer] She denies any known exposures to radiations or toxins.  She lives in [place].  The patient is accompanied by her daughter [name]) today.  Allergies:  Allergies  Allergen Reactions  . Peanut-Containing Drug Products Swelling and Other (See Comments)    Only when eating too many peanuts, swelling with lips and tingly face    Current Medications: Current Outpatient Medications  Medication Sig Dispense Refill  . b complex vitamins tablet Take 1 tablet by mouth daily.    . Bilberry, Vaccinium myrtillus, (BILBERRY PO) Take 1 capsule by mouth daily.     Marland Kitchen  Calcium Carbonate-Vit D-Min (CALCIUM 1200 PO) Take 1 tablet by mouth daily.     Marland Kitchen dexamethasone (DECADRON) 4 MG tablet Take 2 tablets by mouth once a day on the day after chemotherapy and then take 2 tablets two times a day for 2 days. Take with food. 30 tablet 1  . Flaxseed, Linseed, (FLAX SEED OIL PO) Take 1 capsule by mouth daily.     . Lactobacillus (PROBIOTIC ACIDOPHILUS PO) Take 1 capsule by mouth daily.     Marland Kitchen lidocaine-prilocaine (EMLA) cream Apply to affected area once 30 g 3  . loratadine (CLARITIN) 10 MG tablet Take 10 mg by mouth daily.    . magnesium gluconate (MAGONATE) 500 MG tablet Take 500 mg by  mouth daily.     . Multiple Vitamins-Minerals (HAIR/SKIN/NAILS/BIOTIN PO) Take 1 tablet by mouth daily.     . TURMERIC PO Take 1 capsule by mouth daily.     Marland Kitchen acetaminophen (TYLENOL) 500 MG tablet Take 1,000 mg by mouth every 6 (six) hours as needed for moderate pain or headache.    Marland Kitchen LORazepam (ATIVAN) 0.5 MG tablet Take 1 tablet (0.5 mg total) by mouth every 6 (six) hours as needed (Nausea or vomiting). (Patient not taking: Reported on 12/19/2017) 30 tablet 0  . ondansetron (ZOFRAN) 8 MG tablet Take 1 tablet (8 mg total) by mouth 2 (two) times daily as needed. Start on the third day after chemotherapy. (Patient not taking: Reported on 12/19/2017) 30 tablet 1   No current facility-administered medications for this visit.     Review of Systems:  GENERAL:  Feels "good".  No fevers, chills or sweats. Weight up 4 pounds.  PERFORMANCE STATUS (ECOG):  1 HEENT:  No visual changes, runny nose, sore throat, mouth sores or tenderness. Lungs: No shortness of breath or cough.  No hemoptysis. Cardiac:  No chest pain, palpitations, orthopnea, or PND. GI:  No nausea, vomiting, diarrhea, constipation, melena or hematochezia.  Last colonoscopy 2012. GU:  No urgency, frequency, dysuria, or hematuria. Musculoskeletal:  No back pain.  No joint pain.  No muscle tenderness. Extremities:  No pain or swelling. Skin:  No rashes or skin changes. Neuro:  Migraine headaches.  No numbness or weakness, balance or coordination issues. Endocrine:  No diabetes, thyroid issues, hot flashes or night sweats. Psych:  No mood changes, depression or anxiety. Pain:  Tenderness to port-a-cath site Review of systems:  All other systems reviewed and found to be negative.  Physical Exam: Blood pressure 132/76, pulse 73, temperature 98.5 F (36.9 C), temperature source Tympanic, resp. rate 18, weight 156 lb 1.6 oz (70.8 kg). GENERAL:  Well developed, well nourished, woman sitting comfortably in the exam room in no acute  distress. MENTAL STATUS:  Alert and oriented to person, place and time. HEAD:  Blonde hair.  Normocephalic, atraumatic, face symmetric, no Cushingoid features. EYES:  Dark green eyes.  Pupils equal round and reactive to light and accomodation.  No conjunctivitis or scleral icterus. ENT:  Oropharynx clear without lesion.  Tongue normal. Mucous membranes moist.  RESPIRATORY:  Clear to auscultation without rales, wheezes or rhonchi. CARDIOVASCULAR:  Regular rate and rhythm without murmur, rub or gallop. CHEST:  Left sided port-a-cath accessed.  No surrounding erythema or induration.  Non-tender to palpation. ABDOMEN:  Soft, non-tender, with active bowel sounds, and no hepatosplenomegaly.  No masses. SKIN:  No rashes, ulcers or lesions. EXTREMITIES: No edema, no skin discoloration or tenderness.  No palpable cords. LYMPH NODES:  Small mobile high right  axillary lymph node.  No palpable cervical, supraclavicular, or inguinal adenopathy  NEUROLOGICAL: Unremarkable. PSYCH:  Appropriate.   Results for orders placed or performed in visit on 12/19/17 (from the past 24 hour(s))  Magnesium     Status: None   Collection Time: 12/19/17 10:10 AM  Result Value Ref Range   Magnesium 2.1 1.7 - 2.4 mg/dL  Comprehensive metabolic panel     Status: Abnormal   Collection Time: 12/19/17 10:10 AM  Result Value Ref Range   Sodium 137 135 - 145 mmol/L   Potassium 4.1 3.5 - 5.1 mmol/L   Chloride 104 101 - 111 mmol/L   CO2 26 22 - 32 mmol/L   Glucose, Bld 123 (H) 65 - 99 mg/dL   BUN 11 6 - 20 mg/dL   Creatinine, Ser 0.72 0.44 - 1.00 mg/dL   Calcium 8.6 (L) 8.9 - 10.3 mg/dL   Total Protein 7.0 6.5 - 8.1 g/dL   Albumin 3.8 3.5 - 5.0 g/dL   AST 29 15 - 41 U/L   ALT 51 14 - 54 U/L   Alkaline Phosphatase 135 (H) 38 - 126 U/L   Total Bilirubin 0.3 0.3 - 1.2 mg/dL   GFR calc non Af Amer >60 >60 mL/min   GFR calc Af Amer >60 >60 mL/min   Anion gap 7 5 - 15  CBC with Differential     Status: None   Collection  Time: 12/19/17 10:10 AM  Result Value Ref Range   WBC 5.3 3.6 - 11.0 K/uL   RBC 4.41 3.80 - 5.20 MIL/uL   Hemoglobin 13.0 12.0 - 16.0 g/dL   HCT 38.7 35.0 - 47.0 %   MCV 87.8 80.0 - 100.0 fL   MCH 29.5 26.0 - 34.0 pg   MCHC 33.5 32.0 - 36.0 g/dL   RDW 13.1 11.5 - 14.5 %   Platelets 238 150 - 440 K/uL   Neutrophils Relative % 67 %   Neutro Abs 3.6 1.4 - 6.5 K/uL   Lymphocytes Relative 20 %   Lymphs Abs 1.1 1.0 - 3.6 K/uL   Monocytes Relative 10 %   Monocytes Absolute 0.5 0.2 - 0.9 K/uL   Eosinophils Relative 1 %   Eosinophils Absolute 0.0 0 - 0.7 K/uL   Basophils Relative 2 %   Basophils Absolute 0.1 0 - 0.1 K/uL    Recent Results (from the past 2160 hour(s))  Surgical pathology     Status: None   Collection Time: 10/29/17  1:36 PM  Result Value Ref Range   SURGICAL PATHOLOGY      Surgical Pathology THIS IS AN ADDENDUM REPORT CASE: [id number] PATIENT: Audelia Hives Surgical Pathology Report Addendum  Reason for Addendum #1:  Additional clinical/test information  SPECIMEN SUBMITTED: A. Breast, right, 10:00, 3 CMFN B. Lymph node, right axilla C. Breast, left, 2:00, 3 CMFN  CLINICAL HISTORY: Masses and adenopathy  PRE-OPERATIVE DIAGNOSIS: High concern for metastatic [facility]  POST-OPERATIVE DIAGNOSIS: None provided.  DIAGNOSIS: A.  RIGHT BREAST, 10:00, 3 CMFN; ULTRASOUND-GUIDED CORE BIOPSY: - INVASIVE MAMMARY CARCINOMA OF NO SPECIAL TYPE.  Size of invasive carcinoma:  1.4 cm in this sample Histologic grade of invasive carcinoma: Grade 2      Glandular/tubular differentiation score: 3      Nuclear pleomorphism score: 3      Mitotic rate score: 1  B.  LYMPH NODE, RIGHT AXILLA; ULTRASOUND-GUIDED CORE BIOPSY: - METASTATIC CARCINOMA OF BREAST ORIGIN.  C.  LEFT BREAST, 2:00, 3 CMFN; ULTRASOUND-GUIDE D CORE BIOPSY: -  HIGH-GRADE DUCTAL CARCINOMA IN SITU WITH COMEDONECROSIS.  ER/PR/HER2: Immunohistochemistry will be performed with reflex to [place] for HER2 2+  (equivocal) results. The results will be reported in an addendum.  The diagnosis was called to [place] [facility] [place] on 10/30/17 at 105 PM.  GROSS DESCRIPTION:  A. The specimen is received in a formalin-filled container labeled with the patient's name and right breast 10:00, 3 cm from nipple.  Core pieces: 3 Measurement: 1.5-1.8 cm in length and 0.2 cm in diameter Comments: pink to yellow course, marked green  Entirely submitted in cassette(s): 1  Time/Date in fixative: collected and placed in formalin at 12:45 PM on 10/29/2017 Total fixation time: 7.75 hours  B. The specimen is received in a formalin-filled container labeled with the patient's name and right axilla.  Core pieces: multiple, aggregate 2.2 x 0.5 x 0.1 cm Comments: yellow-tan fragments of tissue, marked blue  Entirely submi tted in cassette(s): 1  Time/Date in fixative: collected and placed in formalin at 12:56 AM on 10/29/2017 Total fixation time: 7.5 hours  C. The specimen is received in a formalin-filled container labeled with the patient's name and left breast 2:00, 3 cm nipple.  Core pieces: 3 Measurement: 1.3-1.5 cm in length and 0.2 cm in diameter Comments: yellow-tan cores, marked black  Entirely submitted in cassette(s): 1  Time/Date in fixative: collected and placed in formalin at 1:23 PM on 10/29/2017 Total fixation time: 7 hours   Final Diagnosis performed by Delorse Lek, MD.  Electronically signed 10/30/2017 2:41:01PM  The electronic signature indicates that the named Attending Pathologist has evaluated the specimen  Technical component performed at [facility], [address] [place], [place] [address] Lab: [phone] Dir: Rush Farmer, MD, MMM  Professional component performed at [facility], [facility], [place], [place], [place]  [address] Lab: [phone] Dir: Dellia Nims. Rubinas, MD   Breast Biomarker Reporting Template  BREAST BIOMARKER  TESTS Estrogen Receptor (ER) Status: POSITIVE, >90% nuclear staining      Average intensity of staining: Strong Progesterone Receptor (PgR) Status: POSITIVE, 90% nuclear staining      Average intensity of staining: Moderate HER2 (by immunohistochemistry): NEGATIVE, 1+ Percentage of cells with uniform intense complete membrane staining: 0 %  METHODS Cold Ischemia and Fixation Times: Meet requirements specified in latest version of the ASCO/CAP guidelines Testing Performed on Block Number(s): A1 Fixative: Formalin Estrogen Receptor:  FDA cleared ([place])                    Primary Antibody:  SP1 Progesterone Receptor: FDA cleared ([place])                   Primary Antibody: 1E2 HER2 (by immunohistochemistry): FDA approved ([place])                             Primary Antibody: HercepTest  Immunohistochemistry controls worked appropriately. Slides were prepared  by [facility] Molecular Biology and Pathology, [place], [place], and interpreted by Dr. Luana Shu.  Addendum #1 performed by Delorse Lek, MD.  Electronically signed 10/31/2017 12:34:59PM  Technical component performed at [facility], [address], [place], [place] [address] Lab: [phone] Dir: Rush Farmer, MD, MMM  Professional component performed at [facility], [facility], [place], [place], [place] [address] Lab: [phone] Dir: Dellia Nims. Reuel Derby, MD    Comprehensive metabolic panel     Status: Abnormal   Collection Time: 11/14/17 10:16 AM  Result Value Ref Range   Sodium 139 135 - 145 mmol/L   Potassium 4.1 3.5 - 5.1 mmol/L   Chloride 102 101 - 111 mmol/L   CO2 29 22 - 32 mmol/L   Glucose, Bld 101 (H) 65 - 99 mg/dL   BUN 19 6 - 20 mg/dL   Creatinine, Ser 0.94 0.44 - 1.00 mg/dL   Calcium 9.7 8.9 - 10.3 mg/dL   Total Protein 8.2 (H) 6.5 - 8.1 g/dL   Albumin 4.6 3.5 - 5.0 g/dL   AST 28 15 - 41 U/L   ALT 22 14 - 54 U/L   Alkaline Phosphatase 70 38 - 126 U/L   Total Bilirubin 0.7 0.3 - 1.2  mg/dL   GFR calc non Af Amer >60 >60 mL/min   GFR calc Af Amer >60 >60 mL/min    Comment: (NOTE) The eGFR has been calculated using the CKD EPI equation. This calculation has not been validated in all clinical situations. eGFR's persistently <60 mL/min signify possible Chronic Kidney Disease.    Anion gap 8 5 - 15    Comment: Performed at [facility], [address] [place], [place] [address]  CBC with Differential/Platelet     Status: None   Collection Time: 11/14/17 10:16 AM  Result Value Ref Range   WBC 4.9 3.6 - 11.0 K/uL   RBC 4.96 3.80 - 5.20 MIL/uL   Hemoglobin 14.8 12.0 - 16.0 g/dL   HCT 43.6 35.0 - 47.0 %   MCV 87.8 80.0 - 100.0 fL   MCH 29.8 26.0 - 34.0 pg   MCHC 33.9 32.0 - 36.0 g/dL   RDW 13.0 11.5 - 14.5 %   Platelets 277 150 - 440 K/uL   Neutrophils Relative % 55 %   Neutro Abs 2.7 1.4 - 6.5 K/uL   Lymphocytes Relative 32 %   Lymphs Abs 1.6 1.0 - 3.6 K/uL   Monocytes Relative 10 %   Monocytes Absolute 0.5 0.2 - 0.9 K/uL   Eosinophils Relative 2 %   Eosinophils Absolute 0.1 0 - 0.7 K/uL   Basophils Relative 1 %   Basophils Absolute 0.1 0 - 0.1 K/uL    Comment: Performed at [facility], [address] [place], [place] [address]  Cancer antigen 27.29     Status: None   Collection Time: 11/14/17 10:16 AM  Result Value Ref Range   CA 27.29 23.5 0.0 - 38.6 U/mL    Comment: (NOTE) Bayer Centaur/ACS methodology Performed At: [facility] [place], [place] [id number] Rush Farmer MD [phone] Performed at [facility], [address], [place], [place] [address]   Surgical pcr screen     Status: Abnormal   Collection Time: 11/24/17  8:40 AM  Result Value Ref Range   MRSA, PCR NEGATIVE NEGATIVE   Staphylococcus aureus POSITIVE (A) NEGATIVE    Comment: (NOTE) The Xpert SA Assay (FDA approved for NASAL specimens in patients 27 years of age and older), is one component of a comprehensive surveillance program.  It is not intended to diagnose infection nor to guide or monitor treatment. Performed at [facility], [address] [place], [place] [address]   Glucose, capillary     Status: Abnormal   Collection Time: 11/24/17  9:30 AM  Result Value Ref Range   Glucose-Capillary 101 (H) 65 - 99 mg/dL  Comprehensive metabolic panel     Status: Abnormal   Collection Time: 12/05/17  8:58 AM  Result Value  Ref Range   Sodium 137 135 - 145 mmol/L   Potassium 3.9 3.5 - 5.1 mmol/L   Chloride 102 101 - 111 mmol/L   CO2 25 22 - 32 mmol/L   Glucose, Bld 151 (H) 65 - 99 mg/dL   BUN 13 6 - 20 mg/dL   Creatinine, Ser 0.94 0.44 - 1.00 mg/dL   Calcium 9.1 8.9 - 10.3 mg/dL   Total Protein 7.7 6.5 - 8.1 g/dL   Albumin 4.2 3.5 - 5.0 g/dL   AST 38 15 - 41 U/L   ALT 26 14 - 54 U/L   Alkaline Phosphatase 88 38 - 126 U/L   Total Bilirubin 0.6 0.3 - 1.2 mg/dL   GFR calc non Af Amer >60 >60 mL/min   GFR calc Af Amer >60 >60 mL/min    Comment: (NOTE) The eGFR has been calculated using the CKD EPI equation. This calculation has not been validated in all clinical situations. eGFR's persistently <60 mL/min signify possible Chronic Kidney Disease.    Anion gap 10 5 - 15    Comment: Performed at [facility], [address] [place], [place] [address]  CBC with Differential     Status: None   Collection Time: 12/05/17  8:58 AM  Result Value Ref Range   WBC 5.8 3.6 - 11.0 K/uL   RBC 4.78 3.80 - 5.20 MIL/uL   Hemoglobin 14.2 12.0 - 16.0 g/dL   HCT 42.2 35.0 - 47.0 %   MCV 88.2 80.0 - 100.0 fL   MCH 29.8 26.0 - 34.0 pg   MCHC 33.7 32.0 - 36.0 g/dL   RDW 12.6 11.5 - 14.5 %   Platelets 264 150 - 440 K/uL   Neutrophils Relative % 61 %   Neutro Abs 3.5 1.4 - 6.5 K/uL   Lymphocytes Relative 27 %   Lymphs Abs 1.5 1.0 - 3.6 K/uL   Monocytes Relative 9 %   Monocytes Absolute 0.5 0.2 - 0.9 K/uL   Eosinophils Relative 2 %   Eosinophils Absolute 0.1 0 - 0.7 K/uL   Basophils Relative 1 %   Basophils  Absolute 0.1 0 - 0.1 K/uL    Comment: Performed at [facility], [address] [place], [place] [address]  Magnesium     Status: None   Collection Time: 12/05/17  8:58 AM  Result Value Ref Range   Magnesium 2.0 1.7 - 2.4 mg/dL    Comment: Performed at [facility], [address] [place], [place] [address]    Assessment:  Roan Sawchuk is a 62 y.o. female with clinical stage [id number] right breast cancer and DCIS in the left breast s/p ultrasound guided biopsy on 10/29/2017. Pathology from the right breast at the 10 o'clock position 3 cm from the nipple revealed a 1.4 cm grade II invasive mammary carcinoma of no special type.  Tumor was ER + (> 90%), PR + (90%) and Her2/neu 1+.  Right axillary node biopsy revealed metastatic carcinoma.  Left breast biopsy at the 2 o'clock position 3 cm from the nipple revealed high grade DCIS with comedonecrosis.  CA27.29 was 23.5 on 11/14/2017.  Bilateral diagnostic mammogram and ultrasound on 10/23/2017 revealed a 3.9 x 1.7 x 2.2 cm irregular hypoechoic mass with associated areas of vascularity at the 10 o'clock position 3 cm from the nipple and a 1.1 x 0.5 x 0.4 cm area of nodularity (3 approximately 3 mm nodules at 10 o'clock 6 cm from the nipple in the right breast. The  mass was within a 5 cm area of pleomorphic microcalcifications.  Ultrasound revealed at least 5 prominent lymph nodes with diffuse thickened and hypoechoic cortices suggestive of metastatic disease.  In the left breast, there was an irregular 1.6 x 1.3 x 1.2 cm hypoechoic mass with indistinct margins at 2 o'clock 3 cm from the nipple.  Axillary ultrasound revealed normal nodes.  Bilateral breast MRI on 11/17/2017 revealed a 4.3 x 4.8 x 5 cm mass on the right centered in the upper outer quadrant but also extends into the lower outer and upper inner quadrants.  There was a small amount of enhancement posterior to the right nipple which could represent subtle extension. There was a  cluster of nodules located posterior to the superior aspect of the known malignancy and another small nodule located posterior to the inferior aspect of the malignancy which could represent satellite lesions. Taking the enhancement posterior to the right nipple and the potential posterior satellite lesions into account, the AP dimension could be as large as 7 cm.  The patient's known DCIS spans 5 x 5 x 4.4 cm mammographically based on calcifications. The associated enhancement spans 3.2 x 3.2 x 3.3 cm. However, there were 2 small linearly enhancing regions at 6 o'clock in the left breast which could represent further disease.  There was extensive adenopathy in the right axilla and right retropectoral region. There was at least 1 significantly enlarged right internal mammary lymph node.  PET scan on 11/24/2017 revealed hypermetabolic right axillary (2.6 x 3.1 cm; SUV 8.0), subpectoral/internal mammary (7 mm; SUV 2.4) adenopathy.  There was a 4 mm right lower lobe nodule is too small for PET resolution.  There was cholelithiasis.  She is day 15 s/p cycle #1 AC (12/05/2017) with OnPro Neulasta support.  ANC nadir was 200 on day 8.  Bone density on 12/16/2016 revealed osteopenia with a T-score of -1.6 in the left femoral neck on 12/16/2017.  Symptomatically, she is doing well overall.  Patient complains of increasing pain to her port-a-cath site despite the use of the prescribed EMLA cream.  Area was white with surrounding erythema prior to access.  Exam reveals a normal port-a-cath site.  WBC is 5300 with an [place] of 3600.  Plan: 1.  Labs today:  CBC with diff, CMP, Mg. 2.  Discuss bone density study- osteopenia.  Discuss calcium and vitamin D.  Anticipate initiation of Prolia with an aromatase inhibitor. 3.  Hold cycle #2 AC with On-Pro Neulasta today due to potential port issues. Patient becomes neutropenic with chemotherapy.  4.  No empiric antibiotics as may be an EMLA issue.  Patient to contact clinic  if any fever or erythema surrounding port-a-cath prior to reassessment. 5.  RTC on 12/25/2017 for MD assessment, labs (CBC with diff, CMP, Mg), and port check (in [place]). 6.  RTC on 12/26/2017 for MD assessment and cycle #2 AC with On-Pro Neulasta (in [place]).    Honor Loh, NP  12/19/2017, 11:02 AM   I saw and evaluated the patient, participating in the key portions of the service and reviewing pertinent diagnostic studies and records.  I reviewed the nurse practitioner's note and agree with the findings and the plan.  The assessment and plan were discussed with the patient.  Several questions were asked by the patient and answered.   Nolon Stalls, MD 12/19/2017, 11:02 AM

## 2017-12-25 ENCOUNTER — Inpatient Hospital Stay: Payer: 59

## 2017-12-25 ENCOUNTER — Inpatient Hospital Stay (HOSPITAL_BASED_OUTPATIENT_CLINIC_OR_DEPARTMENT_OTHER): Payer: 59 | Admitting: Hematology and Oncology

## 2017-12-25 ENCOUNTER — Encounter: Payer: Self-pay | Admitting: Hematology and Oncology

## 2017-12-25 VITALS — BP 116/81 | HR 71 | Temp 97.6°F | Resp 18 | Wt 154.1 lb

## 2017-12-25 DIAGNOSIS — Z85828 Personal history of other malignant neoplasm of skin: Secondary | ICD-10-CM

## 2017-12-25 DIAGNOSIS — Z170 Estrogen receptor positive status [ER+]: Secondary | ICD-10-CM

## 2017-12-25 DIAGNOSIS — Z801 Family history of malignant neoplasm of trachea, bronchus and lung: Secondary | ICD-10-CM

## 2017-12-25 DIAGNOSIS — Z5111 Encounter for antineoplastic chemotherapy: Secondary | ICD-10-CM

## 2017-12-25 DIAGNOSIS — D0512 Intraductal carcinoma in situ of left breast: Secondary | ICD-10-CM

## 2017-12-25 DIAGNOSIS — M8580 Other specified disorders of bone density and structure, unspecified site: Secondary | ICD-10-CM

## 2017-12-25 DIAGNOSIS — Z79899 Other long term (current) drug therapy: Secondary | ICD-10-CM | POA: Diagnosis not present

## 2017-12-25 DIAGNOSIS — Z9221 Personal history of antineoplastic chemotherapy: Secondary | ICD-10-CM

## 2017-12-25 DIAGNOSIS — Z87891 Personal history of nicotine dependence: Secondary | ICD-10-CM | POA: Diagnosis not present

## 2017-12-25 DIAGNOSIS — D709 Neutropenia, unspecified: Secondary | ICD-10-CM | POA: Diagnosis not present

## 2017-12-25 DIAGNOSIS — Z7689 Persons encountering health services in other specified circumstances: Secondary | ICD-10-CM

## 2017-12-25 DIAGNOSIS — R5383 Other fatigue: Secondary | ICD-10-CM

## 2017-12-25 DIAGNOSIS — C50411 Malignant neoplasm of upper-outer quadrant of right female breast: Secondary | ICD-10-CM | POA: Diagnosis not present

## 2017-12-25 DIAGNOSIS — Z800 Family history of malignant neoplasm of digestive organs: Secondary | ICD-10-CM

## 2017-12-25 DIAGNOSIS — Z8614 Personal history of Methicillin resistant Staphylococcus aureus infection: Secondary | ICD-10-CM

## 2017-12-25 DIAGNOSIS — Z7189 Other specified counseling: Secondary | ICD-10-CM

## 2017-12-25 LAB — CBC WITH DIFFERENTIAL/PLATELET
Basophils Absolute: 0.1 10*3/uL (ref 0–0.1)
Basophils Relative: 3 %
Eosinophils Absolute: 0 10*3/uL (ref 0–0.7)
Eosinophils Relative: 0 %
HCT: 39.3 % (ref 35.0–47.0)
Hemoglobin: 13.6 g/dL (ref 12.0–16.0)
Lymphocytes Relative: 21 %
Lymphs Abs: 1.1 10*3/uL (ref 1.0–3.6)
MCH: 30.2 pg (ref 26.0–34.0)
MCHC: 34.6 g/dL (ref 32.0–36.0)
MCV: 87.2 fL (ref 80.0–100.0)
Monocytes Absolute: 0.7 10*3/uL (ref 0.2–0.9)
Monocytes Relative: 13 %
Neutro Abs: 3.1 10*3/uL (ref 1.4–6.5)
Neutrophils Relative %: 63 %
Platelets: 365 10*3/uL (ref 150–440)
RBC: 4.5 MIL/uL (ref 3.80–5.20)
RDW: 13 % (ref 11.5–14.5)
WBC: 5 10*3/uL (ref 3.6–11.0)

## 2017-12-25 LAB — COMPREHENSIVE METABOLIC PANEL
ALT: 24 U/L (ref 14–54)
AST: 26 U/L (ref 15–41)
Albumin: 4 g/dL (ref 3.5–5.0)
Alkaline Phosphatase: 97 U/L (ref 38–126)
Anion gap: 6 (ref 5–15)
BUN: 12 mg/dL (ref 6–20)
CO2: 28 mmol/L (ref 22–32)
Calcium: 9.3 mg/dL (ref 8.9–10.3)
Chloride: 104 mmol/L (ref 101–111)
Creatinine, Ser: 0.81 mg/dL (ref 0.44–1.00)
GFR calc Af Amer: >60 mL/min (ref >60)
GFR calc non Af Amer: >60 mL/min (ref >60)
Glucose, Bld: 114 mg/dL — ABNORMAL HIGH (ref 65–99)
Potassium: 4 mmol/L (ref 3.5–5.1)
Sodium: 138 mmol/L (ref 135–145)
Total Bilirubin: 0.6 mg/dL (ref 0.3–1.2)
Total Protein: 7.2 g/dL (ref 6.5–8.1)

## 2017-12-25 LAB — MAGNESIUM: Magnesium: 2.1 mg/dL (ref 1.7–2.4)

## 2017-12-25 NOTE — Progress Notes (Signed)
Patient offers no complaints today. 

## 2017-12-25 NOTE — Progress Notes (Signed)
[facility] day:  12/25/2017   Chief Complaint: Megan Vega is a 62 y.o. female with stage IIIB right breast cancer and DCIS of the left breast who is seen for assessment prior to cycle #2 AC.  HPI: The patient was last seen in the medical oncology clinic on 12/19/2017.  At that time, chemotherapy was postponed secondary concerns for possible port-a-cath infection versus EMLA reaction.   She complained of increasing pain to her port-a-cath site.  Area was white with surrounding erythema prior to access.  Clinic exam revealed a normal port-a-cath site.  Decision was made to observe without antibiotics and reschedule chemotherapy at the next available date.  During the interim, patient doing well. Patient denies continued port issues. She has some slight redness surrounding her port, however she denies the previously reported tenderness. Patient denies any B symptoms and interval infections. She continues to work full time without issues.   Patient is eating well. Weight is down 2 pounds. Patient denies pain in the clinic today.    Past Medical History:  Diagnosis Date  . Cancer ([place])    Basal Cell Carcinoma, about 2011  . Cataract   . Family history of adverse reaction to anesthesia    DAUGHTER-N/V  . Hemorrhoids   . History of methicillin resistant staphylococcus aureus (MRSA) 2015  . Migraines    MIGRAINES    Past Surgical History:  Procedure Laterality Date  . BREAST BIOPSY Bilateral 10/29/2017   rt core with axilla left core also path pending  . CESAREAN SECTION    . COLONOSCOPY  2012  . MANDIBLE FRACTURE SURGERY    . PORTACATH PLACEMENT Left 11/28/2017   Procedure: INSERTION PORT-A-CATH;  Surgeon: Robert Bellow, MD;  Location: [facility] ORS;  Service: General;  Laterality: Left;    Family History  Problem Relation Age of Onset  . Cancer Mother        Esophageal Cancer  . Early death Mother        Age 64  . Cancer Father         Lung Cancer  . Diabetes Father   . Diabetes Brother        Controlled with diet  . Heart attack Paternal Grandfather     Social History:  reports that she quit smoking about 41 years ago. She has a 2.50 pack-year smoking history. she has never used smokeless tobacco. She reports that she drinks about 0.6 oz of alcohol per week. She reports that she does not use drugs.  Patient is a former smoker for 20 years; only smoked "about 3 cigarettes a day"; stopped 30 years ago. Patient drinks alcohol very infrequently; "about 1 glass a month". Patient currently employed as an Web designer at [employer] She denies any known exposures to radiations or toxins.  She lives in [place].  She has one daughter Megan Vega). The patient is alone today.  Allergies:  Allergies  Allergen Reactions  . Peanut-Containing Drug Products Swelling and Other (See Comments)    Only when eating too many peanuts, swelling with lips and tingly face    Current Medications: Current Outpatient Medications  Medication Sig Dispense Refill  . acetaminophen (TYLENOL) 500 MG tablet Take 1,000 mg by mouth every 6 (six) hours as needed for moderate pain or headache.    . b complex vitamins tablet Take 1 tablet by mouth daily.    . Bilberry, Vaccinium myrtillus, (BILBERRY PO) Take 1 capsule by mouth  daily.     . Calcium Carbonate-Vit D-Min (CALCIUM 1200 PO) Take 1 tablet by mouth daily.     Marland Kitchen dexamethasone (DECADRON) 4 MG tablet Take 2 tablets by mouth once a day on the day after chemotherapy and then take 2 tablets two times a day for 2 days. Take with food. 30 tablet 1  . Flaxseed, Linseed, (FLAX SEED OIL PO) Take 1 capsule by mouth daily.     . Lactobacillus (PROBIOTIC ACIDOPHILUS PO) Take 1 capsule by mouth daily.     Marland Kitchen lidocaine-prilocaine (EMLA) cream Apply to affected area once 30 g 3  . loratadine (CLARITIN) 10 MG tablet Take 10 mg by mouth daily.    . magnesium gluconate (MAGONATE) 500 MG tablet  Take 500 mg by mouth daily.     . Multiple Vitamins-Minerals (HAIR/SKIN/NAILS/BIOTIN PO) Take 1 tablet by mouth daily.     . TURMERIC PO Take 1 capsule by mouth daily.     Marland Kitchen LORazepam (ATIVAN) 0.5 MG tablet Take 1 tablet (0.5 mg total) by mouth every 6 (six) hours as needed (Nausea or vomiting). (Patient not taking: Reported on 12/19/2017) 30 tablet 0  . ondansetron (ZOFRAN) 8 MG tablet Take 1 tablet (8 mg total) by mouth 2 (two) times daily as needed. Start on the third day after chemotherapy. (Patient not taking: Reported on 12/19/2017) 30 tablet 1   No current facility-administered medications for this visit.     Review of Systems:  GENERAL:  Feels "fine".  No fevers, chills or sweats. Weight down 2 pounds.  PERFORMANCE STATUS (ECOG):  1 HEENT:  No visual changes, runny nose, sore throat, mouth sores or tenderness. Lungs: No shortness of breath or cough.  No hemoptysis. Cardiac:  No chest pain, palpitations, orthopnea, or PND. GI:  No nausea, vomiting, diarrhea, constipation, melena or hematochezia.  Last colonoscopy 2012. GU:  No urgency, frequency, dysuria, or hematuria. Musculoskeletal:  No back pain.  No joint pain.  No muscle tenderness. Extremities:  No pain or swelling. Skin:  No rashes or skin changes. Neuro:  Migraine headaches.  No numbness or weakness, balance or coordination issues. Endocrine:  No diabetes, thyroid issues, hot flashes or night sweats. Psych:  No mood changes, depression or anxiety. Pain:  No focal pain.  Review of systems:  All other systems reviewed and found to be negative.  Physical Exam: Blood pressure 116/81, pulse 71, temperature 97.6 F (36.4 C), temperature source Tympanic, resp. rate 18, weight 154 lb 2 oz (69.9 kg). GENERAL:  Well developed, well nourished, woman sitting comfortably in the exam room in no acute distress. MENTAL STATUS:  Alert and oriented to person, place and time. HEAD:  Wearing a black skull cap.  Normocephalic, atraumatic, face  symmetric, no Cushingoid features. EYES:  Dark green eyes.  Pupils equal round and reactive to light and accomodation.  No conjunctivitis or scleral icterus. ENT:  Oropharynx clear without lesion.  Tongue normal. Mucous membranes moist.  RESPIRATORY:  Clear to auscultation without rales, wheezes or rhonchi. CARDIOVASCULAR:  Regular rate and rhythm without murmur, rub or gallop. CHEST:  Left sided port-a-cath accessed.  No surrounding erythema or induration.  Non-tender to palpation. ABDOMEN:  Soft, non-tender, with active bowel sounds, and no hepatosplenomegaly.  No masses. SKIN:  No rashes, ulcers or lesions. EXTREMITIES: No edema, no skin discoloration or tenderness.  No palpable cords. LYMPH NODES:  Small mobile high right axillary lymph node.  No palpable cervical, supraclavicular, or inguinal adenopathy  NEUROLOGICAL: Unremarkable. PSYCH:  Appropriate.   Results for orders placed or performed in visit on 12/25/17 (from the past 24 hour(s))  Magnesium     Status: None   Collection Time: 12/25/17  8:11 AM  Result Value Ref Range   Magnesium 2.1 1.7 - 2.4 mg/dL  CBC with Differential     Status: None   Collection Time: 12/25/17  8:11 AM  Result Value Ref Range   WBC 5.0 3.6 - 11.0 K/uL   RBC 4.50 3.80 - 5.20 MIL/uL   Hemoglobin 13.6 12.0 - 16.0 g/dL   HCT 39.3 35.0 - 47.0 %   MCV 87.2 80.0 - 100.0 fL   MCH 30.2 26.0 - 34.0 pg   MCHC 34.6 32.0 - 36.0 g/dL   RDW 13.0 11.5 - 14.5 %   Platelets 365 150 - 440 K/uL   Neutrophils Relative % 63 %   Neutro Abs 3.1 1.4 - 6.5 K/uL   Lymphocytes Relative 21 %   Lymphs Abs 1.1 1.0 - 3.6 K/uL   Monocytes Relative 13 %   Monocytes Absolute 0.7 0.2 - 0.9 K/uL   Eosinophils Relative 0 %   Eosinophils Absolute 0.0 0 - 0.7 K/uL   Basophils Relative 3 %   Basophils Absolute 0.1 0 - 0.1 K/uL  Comprehensive metabolic panel     Status: Abnormal   Collection Time: 12/25/17  8:11 AM  Result Value Ref Range   Sodium 138 135 - 145 mmol/L    Potassium 4.0 3.5 - 5.1 mmol/L   Chloride 104 101 - 111 mmol/L   CO2 28 22 - 32 mmol/L   Glucose, Bld 114 (H) 65 - 99 mg/dL   BUN 12 6 - 20 mg/dL   Creatinine, Ser 0.81 0.44 - 1.00 mg/dL   Calcium 9.3 8.9 - 10.3 mg/dL   Total Protein 7.2 6.5 - 8.1 g/dL   Albumin 4.0 3.5 - 5.0 g/dL   AST 26 15 - 41 U/L   ALT 24 14 - 54 U/L   Alkaline Phosphatase 97 38 - 126 U/L   Total Bilirubin 0.6 0.3 - 1.2 mg/dL   GFR calc non Af Amer >60 >60 mL/min   GFR calc Af Amer >60 >60 mL/min   Anion gap 6 5 - 15    Recent Results (from the past 2160 hour(s))  Surgical pathology     Status: None   Collection Time: 10/29/17  1:36 PM  Result Value Ref Range   SURGICAL PATHOLOGY      Surgical Pathology THIS IS AN ADDENDUM REPORT CASE: [id number] PATIENT: Megan Vega Surgical Pathology Report Addendum  Reason for Addendum #1:  Additional clinical/test information  SPECIMEN SUBMITTED: A. Breast, right, 10:00, 3 CMFN B. Lymph node, right axilla C. Breast, left, 2:00, 3 CMFN  CLINICAL HISTORY: Masses and adenopathy  PRE-OPERATIVE DIAGNOSIS: High concern for metastatic [facility]  POST-OPERATIVE DIAGNOSIS: None provided.  DIAGNOSIS: A.  RIGHT BREAST, 10:00, 3 CMFN; ULTRASOUND-GUIDED CORE BIOPSY: - INVASIVE MAMMARY CARCINOMA OF NO SPECIAL TYPE.  Size of invasive carcinoma:  1.4 cm in this sample Histologic grade of invasive carcinoma: Grade 2      Glandular/tubular differentiation score: 3      Nuclear pleomorphism score: 3      Mitotic rate score: 1  B.  LYMPH NODE, RIGHT AXILLA; ULTRASOUND-GUIDED CORE BIOPSY: - METASTATIC CARCINOMA OF BREAST ORIGIN.  C.  LEFT BREAST, 2:00, 3 CMFN; ULTRASOUND-GUIDE D CORE BIOPSY: - HIGH-GRADE DUCTAL CARCINOMA IN SITU WITH COMEDONECROSIS.  ER/PR/HER2: Immunohistochemistry will be performed with  reflex to [place] for HER2 2+ (equivocal) results. The results will be reported in an addendum.  The diagnosis was called to [facility]  on 10/30/17 at 105 PM.  GROSS DESCRIPTION:  A. The specimen is received in a formalin-filled container labeled with the patient's name and right breast 10:00, 3 cm from nipple.  Core pieces: 3 Measurement: 1.5-1.8 cm in length and 0.2 cm in diameter Comments: pink to yellow course, marked green  Entirely submitted in cassette(s): 1  Time/Date in fixative: collected and placed in formalin at 12:45 PM on 10/29/2017 Total fixation time: 7.75 hours  B. The specimen is received in a formalin-filled container labeled with the patient's name and right axilla.  Core pieces: multiple, aggregate 2.2 x 0.5 x 0.1 cm Comments: yellow-tan fragments of tissue, marked blue  Entirely submi tted in cassette(s): 1  Time/Date in fixative: collected and placed in formalin at 12:56 AM on 10/29/2017 Total fixation time: 7.5 hours  C. The specimen is received in a formalin-filled container labeled with the patient's name and left breast 2:00, 3 cm nipple.  Core pieces: 3 Measurement: 1.3-1.5 cm in length and 0.2 cm in diameter Comments: yellow-tan cores, marked black  Entirely submitted in cassette(s): 1  Time/Date in fixative: collected and placed in formalin at 1:23 PM on 10/29/2017 Total fixation time: 7 hours   Final Diagnosis performed by Delorse Lek, MD.  Electronically signed 10/30/2017 2:41:01PM  The electronic signature indicates that the named Attending Pathologist has evaluated the specimen  Technical component performed at [facility], [address], [place], [place] [address] Lab: [phone] Dir: Rush Farmer, MD, MMM  Professional component performed at [facility], [facility], [place], [place], [place]  [address] Lab: [phone] Dir: Dellia Nims. Rubinas, MD   Breast Biomarker Reporting Template  BREAST BIOMARKER TESTS Estrogen Receptor (ER) Status: POSITIVE, >90% nuclear staining      Average intensity of staining: Strong Progesterone  Receptor (PgR) Status: POSITIVE, 90% nuclear staining      Average intensity of staining: Moderate HER2 (by immunohistochemistry): NEGATIVE, 1+ Percentage of cells with uniform intense complete membrane staining: 0 %  METHODS Cold Ischemia and Fixation Times: Meet requirements specified in latest version of the ASCO/CAP guidelines Testing Performed on Block Number(s): A1 Fixative: Formalin Estrogen Receptor:  FDA cleared ([place])                    Primary Antibody:  SP1 Progesterone Receptor: FDA cleared ([place])                   Primary Antibody: 1E2 HER2 (by immunohistochemistry): FDA approved ([place]KO)                             Primary Antibody: HercepTest  Immunohistochemistry controls worked appropriately. Slides were prepared  by [facility], [place], [place], and interpreted by Dr. Luana Shu.  Addendum #1 performed by Delorse Lek, MD.  Electronically signed 10/31/2017 12:34:59PM  Technical component performed at [facility], [address] [place],  [address] Lab: [phone] Dir: Rush Farmer, MD, MMM  Professional component performed at [facility], [facility], [place], [place],  [address] Lab: [phone] Dir: Dellia Nims. Reuel Derby, MD    Comprehensive metabolic panel     Status: Abnormal   Collection Time: 11/14/17 10:16 AM  Result Value Ref Range   Sodium 139 135 - 145 mmol/L  Potassium 4.1 3.5 - 5.1 mmol/L   Chloride 102 101 - 111 mmol/L   CO2 29 22 - 32 mmol/L   Glucose, Bld 101 (H) 65 - 99 mg/dL   BUN 19 6 - 20 mg/dL   Creatinine, Ser 0.94 0.44 - 1.00 mg/dL   Calcium 9.7 8.9 - 10.3 mg/dL   Total Protein 8.2 (H) 6.5 - 8.1 g/dL   Albumin 4.6 3.5 - 5.0 g/dL   AST 28 15 - 41 U/L   ALT 22 14 - 54 U/L   Alkaline Phosphatase 70 38 - 126 U/L   Total Bilirubin 0.7 0.3 - 1.2 mg/dL   GFR calc non Af Amer >60 >60 mL/min   GFR calc Af Amer >60 >60 mL/min    Comment: (NOTE) The eGFR has been calculated  using the CKD EPI equation. This calculation has not been validated in all clinical situations. eGFR's persistently <60 mL/min signify possible Chronic Kidney Disease.    Anion gap 8 5 - 15    Comment: Performed at [facility], [address] [place], [place] [address]  CBC with Differential/Platelet     Status: None   Collection Time: 11/14/17 10:16 AM  Result Value Ref Range   WBC 4.9 3.6 - 11.0 K/uL   RBC 4.96 3.80 - 5.20 MIL/uL   Hemoglobin 14.8 12.0 - 16.0 g/dL   HCT 43.6 35.0 - 47.0 %   MCV 87.8 80.0 - 100.0 fL   MCH 29.8 26.0 - 34.0 pg   MCHC 33.9 32.0 - 36.0 g/dL   RDW 13.0 11.5 - 14.5 %   Platelets 277 150 - 440 K/uL   Neutrophils Relative % 55 %   Neutro Abs 2.7 1.4 - 6.5 K/uL   Lymphocytes Relative 32 %   Lymphs Abs 1.6 1.0 - 3.6 K/uL   Monocytes Relative 10 %   Monocytes Absolute 0.5 0.2 - 0.9 K/uL   Eosinophils Relative 2 %   Eosinophils Absolute 0.1 0 - 0.7 K/uL   Basophils Relative 1 %   Basophils Absolute 0.1 0 - 0.1 K/uL    Comment: Performed at [facility], [address] [place], [place] [address]  Cancer antigen 27.29     Status: None   Collection Time: 11/14/17 10:16 AM  Result Value Ref Range   CA 27.29 23.5 0.0 - 38.6 U/mL    Comment: (NOTE) Bayer Centaur/ACS methodology Performed At: [facility] [place], [place] [id number] Rush Farmer MD [phone] Performed at [facility], [address] [place], [place] [address]   Surgical pcr screen     Status: Abnormal   Collection Time: 11/24/17  8:40 AM  Result Value Ref Range   MRSA, PCR NEGATIVE NEGATIVE   Staphylococcus aureus POSITIVE (A) NEGATIVE    Comment: (NOTE) The Xpert SA Assay (FDA approved for NASAL specimens in patients 20 years of age and older), is one component of a comprehensive surveillance program. It is not intended to diagnose infection nor to guide or monitor treatment. Performed at [facility], [address] [place], [place] [address]   Glucose, capillary     Status: Abnormal   Collection Time: 11/24/17  9:30 AM  Result Value Ref Range   Glucose-Capillary 101 (H) 65 - 99 mg/dL  Comprehensive metabolic panel     Status: Abnormal   Collection Time: 12/05/17  8:58 AM  Result Value Ref Range   Sodium 137 135 - 145 mmol/L   Potassium 3.9  3.5 - 5.1 mmol/L   Chloride 102 101 - 111 mmol/L   CO2 25 22 - 32 mmol/L   Glucose, Bld 151 (H) 65 - 99 mg/dL   BUN 13 6 - 20 mg/dL   Creatinine, Ser 0.94 0.44 - 1.00 mg/dL   Calcium 9.1 8.9 - 10.3 mg/dL   Total Protein 7.7 6.5 - 8.1 g/dL   Albumin 4.2 3.5 - 5.0 g/dL   AST 38 15 - 41 U/L   ALT 26 14 - 54 U/L   Alkaline Phosphatase 88 38 - 126 U/L   Total Bilirubin 0.6 0.3 - 1.2 mg/dL   GFR calc non Af Amer >60 >60 mL/min   GFR calc Af Amer >60 >60 mL/min    Comment: (NOTE) The eGFR has been calculated using the CKD EPI equation. This calculation has not been validated in all clinical situations. eGFR's persistently <60 mL/min signify possible Chronic Kidney Disease.    Anion gap 10 5 - 15    Comment: Performed at [facility], [address] [place], [place] [address]  CBC with Differential     Status: None   Collection Time: 12/05/17  8:58 AM  Result Value Ref Range   WBC 5.8 3.6 - 11.0 K/uL   RBC 4.78 3.80 - 5.20 MIL/uL   Hemoglobin 14.2 12.0 - 16.0 g/dL   HCT 42.2 35.0 - 47.0 %   MCV 88.2 80.0 - 100.0 fL   MCH 29.8 26.0 - 34.0 pg   MCHC 33.7 32.0 - 36.0 g/dL   RDW 12.6 11.5 - 14.5 %   Platelets 264 150 - 440 K/uL   Neutrophils Relative % 61 %   Neutro Abs 3.5 1.4 - 6.5 K/uL   Lymphocytes Relative 27 %   Lymphs Abs 1.5 1.0 - 3.6 K/uL   Monocytes Relative 9 %   Monocytes Absolute 0.5 0.2 - 0.9 K/uL   Eosinophils Relative 2 %   Eosinophils Absolute 0.1 0 - 0.7 K/uL   Basophils Relative 1 %   Basophils Absolute 0.1 0 - 0.1 K/uL    Comment: Performed at [facility], [address] [place], [place] [address]  Magnesium      Status: None   Collection Time: 12/05/17  8:58 AM  Result Value Ref Range   Magnesium 2.0 1.7 - 2.4 mg/dL    Comment: Performed at [facility], [address] [place], [place] [address]    Assessment:  Yamilka Lopiccolo is a 62 y.o. female with clinical stage [id number] right breast cancer and DCIS in the left breast s/p ultrasound guided biopsy on 10/29/2017. Pathology from the right breast at the 10 o'clock position 3 cm from the nipple revealed a 1.4 cm grade II invasive mammary carcinoma of no special type.  Tumor was ER + (> 90%), PR + (90%) and Her2/neu 1+.  Right axillary node biopsy revealed metastatic carcinoma.  Left breast biopsy at the 2 o'clock position 3 cm from the nipple revealed high grade DCIS with comedonecrosis.  CA27.29 was 23.5 on 11/14/2017.  Bilateral diagnostic mammogram and ultrasound on 10/23/2017 revealed a 3.9 x 1.7 x 2.2 cm irregular hypoechoic mass with associated areas of vascularity at the 10 o'clock position 3 cm from the nipple and a 1.1 x 0.5 x 0.4 cm area of nodularity (3 approximately 3 mm nodules at 10 o'clock 6 cm from the nipple in the right breast. The mass was within a 5 cm area of pleomorphic microcalcifications.  Ultrasound revealed at  least 5 prominent lymph nodes with diffuse thickened and hypoechoic cortices suggestive of metastatic disease.  In the left breast, there was an irregular 1.6 x 1.3 x 1.2 cm hypoechoic mass with indistinct margins at 2 o'clock 3 cm from the nipple.  Axillary ultrasound revealed normal nodes.  Bilateral breast MRI on 11/17/2017 revealed a 4.3 x 4.8 x 5 cm mass on the right centered in the upper outer quadrant but also extends into the lower outer and upper inner quadrants.  There was a small amount of enhancement posterior to the right nipple which could represent subtle extension. There was a cluster of nodules located posterior to the superior aspect of the known malignancy and another small nodule located posterior to the  inferior aspect of the malignancy which could represent satellite lesions. Taking the enhancement posterior to the right nipple and the potential posterior satellite lesions into account, the AP dimension could be as large as 7 cm.  The patient's known DCIS spans 5 x 5 x 4.4 cm mammographically based on calcifications. The associated enhancement spans 3.2 x 3.2 x 3.3 cm. However, there were 2 small linearly enhancing regions at 6 o'clock in the left breast which could represent further disease.  There was extensive adenopathy in the right axilla and right retropectoral region. There was at least 1 significantly enlarged right internal mammary lymph node.  PET scan on 11/24/2017 revealed hypermetabolic right axillary (2.6 x 3.1 cm; SUV 8.0), subpectoral/internal mammary (7 mm; SUV 2.4) adenopathy.  There was a 4 mm right lower lobe nodule is too small for PET resolution.  There was cholelithiasis.  She is day 15 s/p cycle #1 AC (12/05/2017) with OnPro Neulasta support.  ANC nadir was 200 on day 8.  Bone density on 12/16/2016 revealed osteopenia with a T-score of -1.6 in the left femoral neck on 12/16/2017.  Symptomatically, she is doing well overall.  Previously reported pain associated with her port-a-cath site has resolved.  Exam is unremarkable. WBC is 5000 with an [place] of 3100.  Plan: 1.  Labs today:  CBC with diff, CMP, Mg. 2.  Discuss port. No residual issues. Labs reviewed. Blood counts stable and adequate for treatment. Will proceed with cycle #2 [facility] tomorrow. 3.  Cancel all previous appointments and follow new schedule due to held chemo last week.  4.  Cycle #2 AC with On-Pro Neulasta (in [place]) tomorrow.  5.  RTC on 01/09/2018 for MD assessment, labs (CBC with diff, CMP, Mg), and cycle #3 AC with On-Pro Neulasta ([place]).     Honor Loh, NP  12/25/2017, 9:02 AM   I saw and evaluated the patient, participating in the key portions of the service and reviewing pertinent diagnostic studies  and records.  I reviewed the nurse practitioner's note and agree with the findings and the plan.  The assessment and plan were discussed with the patient.  Several questions were asked by the patient and answered.   Nolon Stalls, MD 12/25/2017, 9:02 AM

## 2017-12-25 NOTE — Progress Notes (Signed)
Pt's port accessed this am, flushes well, good blood return, area surrounding port slightly red, pt uses press and seal to cover emla cream, instructed to use regular plastic wrap instead

## 2017-12-26 ENCOUNTER — Inpatient Hospital Stay: Payer: 59

## 2017-12-26 ENCOUNTER — Other Ambulatory Visit: Payer: Self-pay | Admitting: Hematology and Oncology

## 2017-12-26 VITALS — BP 122/77 | HR 69 | Temp 97.2°F | Resp 18

## 2017-12-26 DIAGNOSIS — C50411 Malignant neoplasm of upper-outer quadrant of right female breast: Secondary | ICD-10-CM | POA: Diagnosis not present

## 2017-12-26 MED ORDER — SODIUM CHLORIDE 0.9 % IV SOLN
Freq: Once | INTRAVENOUS | Status: AC
  Administered 2017-12-26: 10:00:00 via INTRAVENOUS
  Filled 2017-12-26: qty 1000

## 2017-12-26 MED ORDER — HEPARIN SOD (PORK) LOCK FLUSH 100 UNIT/ML IV SOLN
INTRAVENOUS | Status: AC
Start: 2017-12-26 — End: 2017-12-26
  Filled 2017-12-26: qty 5

## 2017-12-26 MED ORDER — DOXORUBICIN HCL CHEMO IV INJECTION 2 MG/ML: 60.0000 mg/m2 | Freq: Once | INTRAVENOUS | Status: DC

## 2017-12-26 MED ORDER — SODIUM CHLORIDE 0.9 % IV SOLN
Freq: Once | INTRAVENOUS | Status: AC
  Administered 2017-12-26: 11:00:00 via INTRAVENOUS
  Filled 2017-12-26: qty 5

## 2017-12-26 MED ORDER — SODIUM CHLORIDE 0.9 % IV SOLN
1000.0000 mg | Freq: Once | INTRAVENOUS | Status: AC
  Administered 2017-12-26: 1000 mg via INTRAVENOUS
  Filled 2017-12-26: qty 50

## 2017-12-26 MED ORDER — SODIUM CHLORIDE 0.9% FLUSH
10.0000 mL | INTRAVENOUS | Status: DC | PRN
  Filled 2017-12-26: qty 10

## 2017-12-26 MED ORDER — PALONOSETRON HCL INJECTION 0.25 MG/5ML
0.2500 mg | Freq: Once | INTRAVENOUS | Status: AC
  Administered 2017-12-26: 0.25 mg via INTRAVENOUS
  Filled 2017-12-26: qty 5

## 2017-12-26 MED ORDER — DOXORUBICIN HCL CHEMO IV INJECTION 2 MG/ML
60.0000 mg/m2 | Freq: Once | INTRAVENOUS | Status: AC
  Administered 2017-12-26: 108 mg via INTRAVENOUS
  Filled 2017-12-26: qty 54

## 2017-12-26 MED ORDER — HEPARIN SOD (PORK) LOCK FLUSH 100 UNIT/ML IV SOLN
500.0000 [IU] | Freq: Once | INTRAVENOUS | Status: AC | PRN
  Administered 2017-12-26: 500 [IU]

## 2017-12-26 MED ORDER — PEGFILGRASTIM 6 MG/0.6ML ~~LOC~~ PSKT
6.0000 mg | PREFILLED_SYRINGE | Freq: Once | SUBCUTANEOUS | Status: AC
  Administered 2017-12-26: 6 mg via SUBCUTANEOUS
  Filled 2017-12-26: qty 0.6

## 2017-12-26 NOTE — Patient Instructions (Signed)
Cyclophosphamide injection What is this medicine? CYCLOPHOSPHAMIDE (sye kloe FOSS fa mide) is a chemotherapy drug. It slows the growth of cancer cells. This medicine is used to treat many types of cancer like lymphoma, myeloma, leukemia, breast cancer, and ovarian cancer, to name a few. This medicine may be used for other purposes; ask your health care provider or pharmacist if you have questions. COMMON BRAND NAME(S): Cytoxan, Neosar What should I tell my health care provider before I take this medicine? They need to know if you have any of these conditions: -blood disorders -history of other chemotherapy -infection -kidney disease -liver disease -recent or ongoing radiation therapy -tumors in the bone marrow -an unusual or allergic reaction to cyclophosphamide, other chemotherapy, other medicines, foods, dyes, or preservatives -pregnant or trying to get pregnant -breast-feeding How should I use this medicine? This drug is usually given as an injection into a vein or muscle or by infusion into a vein. It is administered in a hospital or clinic by a specially trained health care professional. Talk to your pediatrician regarding the use of this medicine in children. Special care may be needed. Overdosage: If you think you have taken too much of this medicine contact a poison control center or emergency room at once. NOTE: This medicine is only for you. Do not share this medicine with others. What if I miss a dose? It is important not to miss your dose. Call your doctor or health care professional if you are unable to keep an appointment. What may interact with this medicine? This medicine may interact with the following medications: -amiodarone -amphotericin B -azathioprine -certain antiviral medicines for HIV or AIDS such as protease inhibitors (e.g., indinavir, ritonavir) and zidovudine -certain blood pressure medications such as benazepril, captopril, enalapril, fosinopril,  lisinopril, moexipril, monopril, perindopril, quinapril, ramipril, trandolapril -certain cancer medications such as anthracyclines (e.g., daunorubicin, doxorubicin), busulfan, cytarabine, paclitaxel, pentostatin, tamoxifen, trastuzumab -certain diuretics such as chlorothiazide, chlorthalidone, hydrochlorothiazide, indapamide, metolazone -certain medicines that treat or prevent blood clots like warfarin -certain muscle relaxants such as succinylcholine -cyclosporine -etanercept -indomethacin -medicines to increase blood counts like filgrastim, pegfilgrastim, sargramostim -medicines used as general anesthesia -metronidazole -natalizumab This list may not describe all possible interactions. Give your health care provider a list of all the medicines, herbs, non-prescription drugs, or dietary supplements you use. Also tell them if you smoke, drink alcohol, or use illegal drugs. Some items may interact with your medicine. What should I watch for while using this medicine? Visit your doctor for checks on your progress. This drug may make you feel generally unwell. This is not uncommon, as chemotherapy can affect healthy cells as well as cancer cells. Report any side effects. Continue your course of treatment even though you feel ill unless your doctor tells you to stop. Drink water or other fluids as directed. Urinate often, even at night. In some cases, you may be given additional medicines to help with side effects. Follow all directions for their use. Call your doctor or health care professional for advice if you get a fever, chills or sore throat, or other symptoms of a cold or flu. Do not treat yourself. This drug decreases your body's ability to fight infections. Try to avoid being around people who are sick. This medicine may increase your risk to bruise or bleed. Call your doctor or health care professional if you notice any unusual bleeding. Be careful brushing and flossing your teeth or using a  toothpick because you may get an infection or bleed   more easily. If you have any dental work done, tell your dentist you are receiving this medicine. You may get drowsy or dizzy. Do not drive, use machinery, or do anything that needs mental alertness until you know how this medicine affects you. Do not become pregnant while taking this medicine or for 1 year after stopping it. Women should inform their doctor if they wish to become pregnant or think they might be pregnant. Men should not father a child while taking this medicine and for 4 months after stopping it. There is a potential for serious side effects to an unborn child. Talk to your health care professional or pharmacist for more information. Do not breast-feed an infant while taking this medicine. This medicine may interfere with the ability to have a child. This medicine has caused ovarian failure in some women. This medicine has caused reduced sperm counts in some men. You should talk with your doctor or health care professional if you are concerned about your fertility. If you are going to have surgery, tell your doctor or health care professional that you have taken this medicine. What side effects may I notice from receiving this medicine? Side effects that you should report to your doctor or health care professional as soon as possible: -allergic reactions like skin rash, itching or hives, swelling of the face, lips, or tongue -low blood counts - this medicine may decrease the number of white blood cells, red blood cells and platelets. You may be at increased risk for infections and bleeding. -signs of infection - fever or chills, cough, sore throat, pain or difficulty passing urine -signs of decreased platelets or bleeding - bruising, pinpoint red spots on the skin, black, tarry stools, blood in the urine -signs of decreased red blood cells - unusually weak or tired, fainting spells, lightheadedness -breathing problems -dark  urine -dizziness -palpitations -swelling of the ankles, feet, hands -trouble passing urine or change in the amount of urine -weight gain -yellowing of the eyes or skin Side effects that usually do not require medical attention (report to your doctor or health care professional if they continue or are bothersome): -changes in nail or skin color -hair loss -missed menstrual periods -mouth sores -nausea, vomiting This list may not describe all possible side effects. Call your doctor for medical advice about side effects. You may report side effects to FDA at 1-800-FDA-1088. Where should I keep my medicine? This drug is given in a hospital or clinic and will not be stored at home. NOTE: This sheet is a summary. It may not cover all possible information. If you have questions about this medicine, talk to your doctor, pharmacist, or health care provider.  2018 Elsevier/Gold Standard (2012-09-18 16:22:58) Doxorubicin injection What is this medicine? DOXORUBICIN (dox oh ROO bi sin) is a chemotherapy drug. It is used to treat many kinds of cancer like leukemia, lymphoma, neuroblastoma, sarcoma, and Wilms' tumor. It is also used to treat bladder cancer, breast cancer, lung cancer, ovarian cancer, stomach cancer, and thyroid cancer. This medicine may be used for other purposes; ask your health care provider or pharmacist if you have questions. COMMON BRAND NAME(S): Adriamycin, Adriamycin PFS, Adriamycin RDF, Rubex What should I tell my health care provider before I take this medicine? They need to know if you have any of these conditions: -heart disease -history of low blood counts caused by a medicine -liver disease -recent or ongoing radiation therapy -an unusual or allergic reaction to doxorubicin, other chemotherapy agents, other medicines, foods,   dyes, or preservatives -pregnant or trying to get pregnant -breast-feeding How should I use this medicine? This drug is given as an infusion  into a vein. It is administered in a hospital or clinic by a specially trained health care professional. If you have pain, swelling, burning or any unusual feeling around the site of your injection, tell your health care professional right away. Talk to your pediatrician regarding the use of this medicine in children. Special care may be needed. Overdosage: If you think you have taken too much of this medicine contact a poison control center or emergency room at once. NOTE: This medicine is only for you. Do not share this medicine with others. What if I miss a dose? It is important not to miss your dose. Call your doctor or health care professional if you are unable to keep an appointment. What may interact with this medicine? This medicine may interact with the following medications: -6-mercaptopurine -paclitaxel -phenytoin -St. John's Wort -trastuzumab -verapamil This list may not describe all possible interactions. Give your health care provider a list of all the medicines, herbs, non-prescription drugs, or dietary supplements you use. Also tell them if you smoke, drink alcohol, or use illegal drugs. Some items may interact with your medicine. What should I watch for while using this medicine? This drug may make you feel generally unwell. This is not uncommon, as chemotherapy can affect healthy cells as well as cancer cells. Report any side effects. Continue your course of treatment even though you feel ill unless your doctor tells you to stop. There is a maximum amount of this medicine you should receive throughout your life. The amount depends on the medical condition being treated and your overall health. Your doctor will watch how much of this medicine you receive in your lifetime. Tell your doctor if you have taken this medicine before. You may need blood work done while you are taking this medicine. Your urine may turn red for a few days after your dose. This is not blood. If your urine  is dark or brown, call your doctor. In some cases, you may be given additional medicines to help with side effects. Follow all directions for their use. Call your doctor or health care professional for advice if you get a fever, chills or sore throat, or other symptoms of a cold or flu. Do not treat yourself. This drug decreases your body's ability to fight infections. Try to avoid being around people who are sick. This medicine may increase your risk to bruise or bleed. Call your doctor or health care professional if you notice any unusual bleeding. Talk to your doctor about your risk of cancer. You may be more at risk for certain types of cancers if you take this medicine. Do not become pregnant while taking this medicine or for 6 months after stopping it. Women should inform their doctor if they wish to become pregnant or think they might be pregnant. Men should not father a child while taking this medicine and for 6 months after stopping it. There is a potential for serious side effects to an unborn child. Talk to your health care professional or pharmacist for more information. Do not breast-feed an infant while taking this medicine. This medicine has caused ovarian failure in some women and reduced sperm counts in some men This medicine may interfere with the ability to have a child. Talk with your doctor or health care professional if you are concerned about your fertility. What side effects   may I notice from receiving this medicine? Side effects that you should report to your doctor or health care professional as soon as possible: -allergic reactions like skin rash, itching or hives, swelling of the face, lips, or tongue -breathing problems -chest pain -fast or irregular heartbeat -low blood counts - this medicine may decrease the number of white blood cells, red blood cells and platelets. You may be at increased risk for infections and bleeding. -pain, redness, or irritation at site where  injected -signs of infection - fever or chills, cough, sore throat, pain or difficulty passing urine -signs of decreased platelets or bleeding - bruising, pinpoint red spots on the skin, black, tarry stools, blood in the urine -swelling of the ankles, feet, hands -tiredness -weakness Side effects that usually do not require medical attention (report to your doctor or health care professional if they continue or are bothersome): -diarrhea -hair loss -mouth sores -nail discoloration or damage -nausea -red colored urine -vomiting This list may not describe all possible side effects. Call your doctor for medical advice about side effects. You may report side effects to FDA at 1-800-FDA-1088. Where should I keep my medicine? This drug is given in a hospital or clinic and will not be stored at home. NOTE: This sheet is a summary. It may not cover all possible information. If you have questions about this medicine, talk to your doctor, pharmacist, or health care provider.  2018 Elsevier/Gold Standard (2016-01-01 11:28:51)  

## 2018-01-02 ENCOUNTER — Ambulatory Visit: Payer: 59 | Admitting: Hematology and Oncology

## 2018-01-02 ENCOUNTER — Ambulatory Visit: Payer: 59

## 2018-01-02 ENCOUNTER — Other Ambulatory Visit: Payer: 59

## 2018-01-09 ENCOUNTER — Telehealth: Payer: Self-pay | Admitting: *Deleted

## 2018-01-09 ENCOUNTER — Other Ambulatory Visit: Payer: Self-pay

## 2018-01-09 ENCOUNTER — Inpatient Hospital Stay (HOSPITAL_BASED_OUTPATIENT_CLINIC_OR_DEPARTMENT_OTHER): Payer: 59 | Admitting: Hematology and Oncology

## 2018-01-09 ENCOUNTER — Inpatient Hospital Stay: Payer: 59

## 2018-01-09 ENCOUNTER — Encounter: Payer: Self-pay | Admitting: *Deleted

## 2018-01-09 ENCOUNTER — Other Ambulatory Visit: Payer: Self-pay | Admitting: Hematology and Oncology

## 2018-01-09 ENCOUNTER — Encounter: Payer: Self-pay | Admitting: Hematology and Oncology

## 2018-01-09 VITALS — BP 126/74 | HR 85 | Temp 97.8°F | Resp 18 | Wt 154.8 lb

## 2018-01-09 DIAGNOSIS — D709 Neutropenia, unspecified: Secondary | ICD-10-CM

## 2018-01-09 DIAGNOSIS — Z8614 Personal history of Methicillin resistant Staphylococcus aureus infection: Secondary | ICD-10-CM

## 2018-01-09 DIAGNOSIS — C50411 Malignant neoplasm of upper-outer quadrant of right female breast: Secondary | ICD-10-CM

## 2018-01-09 DIAGNOSIS — Z87891 Personal history of nicotine dependence: Secondary | ICD-10-CM

## 2018-01-09 DIAGNOSIS — Z79899 Other long term (current) drug therapy: Secondary | ICD-10-CM

## 2018-01-09 DIAGNOSIS — R5383 Other fatigue: Secondary | ICD-10-CM

## 2018-01-09 DIAGNOSIS — D0512 Intraductal carcinoma in situ of left breast: Secondary | ICD-10-CM

## 2018-01-09 DIAGNOSIS — Z5111 Encounter for antineoplastic chemotherapy: Secondary | ICD-10-CM

## 2018-01-09 DIAGNOSIS — D701 Agranulocytosis secondary to cancer chemotherapy: Secondary | ICD-10-CM

## 2018-01-09 DIAGNOSIS — Z7189 Other specified counseling: Secondary | ICD-10-CM

## 2018-01-09 DIAGNOSIS — Z9221 Personal history of antineoplastic chemotherapy: Secondary | ICD-10-CM | POA: Diagnosis not present

## 2018-01-09 DIAGNOSIS — Z85828 Personal history of other malignant neoplasm of skin: Secondary | ICD-10-CM | POA: Diagnosis not present

## 2018-01-09 DIAGNOSIS — M8580 Other specified disorders of bone density and structure, unspecified site: Secondary | ICD-10-CM | POA: Diagnosis not present

## 2018-01-09 DIAGNOSIS — Z170 Estrogen receptor positive status [ER+]: Secondary | ICD-10-CM

## 2018-01-09 DIAGNOSIS — T451X5A Adverse effect of antineoplastic and immunosuppressive drugs, initial encounter: Secondary | ICD-10-CM

## 2018-01-09 DIAGNOSIS — Z7689 Persons encountering health services in other specified circumstances: Secondary | ICD-10-CM | POA: Diagnosis not present

## 2018-01-09 DIAGNOSIS — Z801 Family history of malignant neoplasm of trachea, bronchus and lung: Secondary | ICD-10-CM

## 2018-01-09 DIAGNOSIS — Z800 Family history of malignant neoplasm of digestive organs: Secondary | ICD-10-CM

## 2018-01-09 LAB — COMPREHENSIVE METABOLIC PANEL
ALT: 20 U/L (ref 14–54)
AST: 22 U/L (ref 15–41)
Albumin: 4 g/dL (ref 3.5–5.0)
Alkaline Phosphatase: 74 U/L (ref 38–126)
Anion gap: 4 — ABNORMAL LOW (ref 5–15)
BUN: 13 mg/dL (ref 6–20)
CO2: 26 mmol/L (ref 22–32)
Calcium: 8.7 mg/dL — ABNORMAL LOW (ref 8.9–10.3)
Chloride: 107 mmol/L (ref 101–111)
Creatinine, Ser: 0.77 mg/dL (ref 0.44–1.00)
GFR calc Af Amer: >60 mL/min (ref >60)
GFR calc non Af Amer: >60 mL/min (ref >60)
Glucose, Bld: 113 mg/dL — ABNORMAL HIGH (ref 65–99)
Potassium: 3.9 mmol/L (ref 3.5–5.1)
Sodium: 137 mmol/L (ref 135–145)
Total Bilirubin: 0.7 mg/dL (ref 0.3–1.2)
Total Protein: 6.9 g/dL (ref 6.5–8.1)

## 2018-01-09 LAB — CBC WITH DIFFERENTIAL/PLATELET
Basophils Absolute: 0.1 10*3/uL (ref 0–0.1)
Basophils Relative: 6 %
Eosinophils Absolute: 0.1 10*3/uL (ref 0–0.7)
Eosinophils Relative: 6 %
HCT: 36.9 % (ref 35.0–47.0)
Hemoglobin: 12.7 g/dL (ref 12.0–16.0)
Lymphocytes Relative: 44 %
Lymphs Abs: 0.6 10*3/uL — ABNORMAL LOW (ref 1.0–3.6)
MCH: 30.2 pg (ref 26.0–34.0)
MCHC: 34.5 g/dL (ref 32.0–36.0)
MCV: 87.5 fL (ref 80.0–100.0)
Monocytes Absolute: 0.4 10*3/uL (ref 0.2–0.9)
Monocytes Relative: 29 %
Neutro Abs: 0.2 10*3/uL — ABNORMAL LOW (ref 1.4–6.5)
Neutrophils Relative %: 17 %
Platelets: 162 10*3/uL (ref 150–440)
RBC: 4.22 MIL/uL (ref 3.80–5.20)
RDW: 13.6 % (ref 11.5–14.5)
WBC: 1.3 10*3/uL — CL (ref 3.6–11.0)

## 2018-01-09 LAB — MAGNESIUM: Magnesium: 1.9 mg/dL (ref 1.7–2.4)

## 2018-01-09 MED ORDER — HEPARIN SOD (PORK) LOCK FLUSH 100 UNIT/ML IV SOLN
500.0000 [IU] | Freq: Once | INTRAVENOUS | Status: AC
  Administered 2018-01-09: 500 [IU] via INTRAVENOUS
  Filled 2018-01-09: qty 5

## 2018-01-09 MED ORDER — SODIUM CHLORIDE 0.9% FLUSH
10.0000 mL | INTRAVENOUS | Status: DC | PRN
  Administered 2018-01-09: 10 mL via INTRAVENOUS
  Filled 2018-01-09: qty 10

## 2018-01-09 NOTE — Progress Notes (Signed)
  Oncology Nurse Navigator Documentation  Navigator Location: [facility] (01/09/18 1100)   )Navigator Encounter Type: Clinic/MDC (01/09/18 1100)                     Patient Visit Type: MedOnc (01/09/18 1100) Treatment Phase: Active Tx (01/09/18 1100)                            Time Spent with Patient: 15 (01/09/18 1100)   Met patient today during her medical oncology visit.  No needs at this time.

## 2018-01-09 NOTE — Progress Notes (Signed)
Here for follow up. Per pt overall doing well.  

## 2018-01-09 NOTE — Telephone Encounter (Signed)
Critical ANC - 0.2  MD notified.

## 2018-01-09 NOTE — Progress Notes (Signed)
[facility] day:  01/09/2018   Chief Complaint: Megan Vega is a 62 y.o. female with stage IIIB right breast cancer and DCIS of the left breast who is seen for assessment prior to cycle #3 AC.  HPI: The patient was last seen in the medical oncology clinic on 12/25/2017.  At that time, she was doing well.   The previously reported pain associated with her port-a-cath site had resolved.  Exam was unremarkable. WBC was 5000 with an [place] of 3100.  She received cycle #2 AC in [place] the following day with OnPro Neulasta support.  During the interim, patient has been doing well. She denies an uncontrolled nausea, vomiting, and significant pain. Patient verbalizes no breast concerns. She feels generally well. She has not experienced any B symptoms or interval infections. Patient is practicing strict neutropenic precautions. She denies the use of any herbal products.   Patient is eating well.  Her weight remains stable.  She has been working.  A coworker has the flu.  Patient denies pain in the clinic today.    Past Medical History:  Diagnosis Date  . Cancer ([place])    Basal Cell Carcinoma, about 2011  . Cataract   . Family history of adverse reaction to anesthesia    DAUGHTER-N/V  . Hemorrhoids   . History of methicillin resistant staphylococcus aureus (MRSA) 2015  . Migraines    MIGRAINES    Past Surgical History:  Procedure Laterality Date  . BREAST BIOPSY Bilateral 10/29/2017   rt core with axilla left core also path pending  . CESAREAN SECTION    . COLONOSCOPY  2012  . MANDIBLE FRACTURE SURGERY    . PORTACATH PLACEMENT Left 11/28/2017   Procedure: INSERTION PORT-A-CATH;  Surgeon: Robert Bellow, MD;  Location: [facility] ORS;  Service: General;  Laterality: Left;    Family History  Problem Relation Age of Onset  . Cancer Mother        Esophageal Cancer  . Early death Mother        Age 53  . Cancer Father        Lung Cancer  .  Diabetes Father   . Diabetes Brother        Controlled with diet  . Heart attack Paternal Grandfather     Social History:  reports that she quit smoking about 41 years ago. She has a 2.50 pack-year smoking history. she has never used smokeless tobacco. She reports that she drinks about 0.6 oz of alcohol per week. She reports that she does not use drugs.  Patient is a former smoker for 20 years; only smoked "about 3 cigarettes a day"; stopped 30 years ago. Patient drinks alcohol very infrequently; "about 1 glass a month". Patient currently employed as an Web designer at [employer] She denies any known exposures to radiations or toxins.  She lives in [place].  She has one daughter [name]).   Allergies:  Allergies  Allergen Reactions  . Peanut-Containing Drug Products Swelling and Other (See Comments)    Only when eating too many peanuts, swelling with lips and tingly face    Current Medications: Current Outpatient Medications  Medication Sig Dispense Refill  . b complex vitamins tablet Take 1 tablet by mouth daily.    . Calcium Carbonate-Vit D-Min (CALCIUM 1200 PO) Take 1 tablet by mouth daily.     Marland Kitchen lidocaine-prilocaine (EMLA) cream Apply to affected area once 30 g 3  . loratadine (CLARITIN)  10 MG tablet Take 10 mg by mouth daily.    . magnesium gluconate (MAGONATE) 500 MG tablet Take 500 mg by mouth daily.     . Multiple Vitamins-Minerals (HAIR/SKIN/NAILS/BIOTIN PO) Take 1 tablet by mouth daily.     . ondansetron (ZOFRAN) 8 MG tablet Take 1 tablet (8 mg total) by mouth 2 (two) times daily as needed. Start on the third day after chemotherapy. 30 tablet 1  . acetaminophen (TYLENOL) 500 MG tablet Take 1,000 mg by mouth every 6 (six) hours as needed for moderate pain or headache.    . Bilberry, Vaccinium myrtillus, (BILBERRY PO) Take 1 capsule by mouth daily.     Marland Kitchen dexamethasone (DECADRON) 4 MG tablet Take 2 tablets by mouth once a day on the day after chemotherapy  and then take 2 tablets two times a day for 2 days. Take with food. (Patient not taking: Reported on 01/09/2018) 30 tablet 1  . Flaxseed, Linseed, (FLAX SEED OIL PO) Take 1 capsule by mouth daily.     . Lactobacillus (PROBIOTIC ACIDOPHILUS PO) Take 1 capsule by mouth daily.     Marland Kitchen LORazepam (ATIVAN) 0.5 MG tablet Take 1 tablet (0.5 mg total) by mouth every 6 (six) hours as needed (Nausea or vomiting). (Patient not taking: Reported on 12/19/2017) 30 tablet 0  . TURMERIC PO Take 1 capsule by mouth daily.      No current facility-administered medications for this visit.     Review of Systems:  GENERAL:  Feels "good".  No fevers, chills or sweats. Weight stable.  PERFORMANCE STATUS (ECOG):  1 HEENT:  No visual changes, runny nose, sore throat, mouth sores or tenderness. Lungs: No shortness of breath or cough.  No hemoptysis. Cardiac:  No chest pain, palpitations, orthopnea, or PND. GI:  No nausea, vomiting, diarrhea, constipation, melena or hematochezia.  Last colonoscopy 2012. GU:  No urgency, frequency, dysuria, or hematuria. Musculoskeletal:  No back pain.  No joint pain.  No muscle tenderness. Extremities:  No pain or swelling. Skin:  No rashes or skin changes. Neuro:  Migraine headaches.  No numbness or weakness, balance or coordination issues. Endocrine:  No diabetes, thyroid issues, hot flashes or night sweats. Psych:  No mood changes, depression or anxiety. Pain:  No focal pain.  Review of systems:  All other systems reviewed and found to be negative.  Physical Exam: Blood pressure 126/74, pulse 85, temperature 97.8 F (36.6 C), temperature source Tympanic, resp. rate 18, weight 154 lb 12.8 oz (70.2 kg). GENERAL:  Well developed, well nourished, woman sitting comfortably in the exam room in no acute distress. MENTAL STATUS:  Alert and oriented to person, place and time. HEAD:  Wearing a gray cap.  Normocephalic, atraumatic, face symmetric, no Cushingoid features. EYES:  Dark green  eyes.  Pupils equal round and reactive to light and accomodation.  No conjunctivitis or scleral icterus. ENT:  Oropharynx clear without lesion.  Tongue normal. Mucous membranes moist.  RESPIRATORY:  Clear to auscultation without rales, wheezes or rhonchi. CARDIOVASCULAR:  Regular rate and rhythm without murmur, rub or gallop. CHEST:  Left sided port-a-cath accessed.  No surrounding erythema or induration. ABDOMEN:  Soft, non-tender, with active bowel sounds, and no hepatosplenomegaly.  No masses. SKIN:  No rashes, ulcers or lesions. EXTREMITIES: No edema, no skin discoloration or tenderness.  No palpable cords. LYMPH NODES:  Small mobile high right axillary lymph node.  No palpable cervical, supraclavicular, or inguinal adenopathy  NEUROLOGICAL: Unremarkable. PSYCH:  Appropriate.   No  results found for this or any previous visit (from the past 24 hour(s)).   Assessment:  Megan Vega is a 62 y.o. female with clinical stage T2N3bM0 right breast cancer and DCIS in the left breast s/p ultrasound guided biopsy on 10/29/2017. Pathology from the right breast at the 10 o'clock position 3 cm from the nipple revealed a 1.4 cm grade II invasive mammary carcinoma of no special type.  Tumor was ER + (> 90%), PR + (90%) and Her2/neu 1+.  Right axillary node biopsy revealed metastatic carcinoma.  Left breast biopsy at the 2 o'clock position 3 cm from the nipple revealed high grade DCIS with comedonecrosis.  CA27.29 was 23.5 on 11/14/2017.  Bilateral diagnostic mammogram and ultrasound on 10/23/2017 revealed a 3.9 x 1.7 x 2.2 cm irregular hypoechoic mass with associated areas of vascularity at the 10 o'clock position 3 cm from the nipple and a 1.1 x 0.5 x 0.4 cm area of nodularity (3 approximately 3 mm nodules at 10 o'clock 6 cm from the nipple in the right breast. The mass was within a 5 cm area of pleomorphic microcalcifications.  Ultrasound revealed at least 5 prominent lymph nodes with diffuse thickened  and hypoechoic cortices suggestive of metastatic disease.  In the left breast, there was an irregular 1.6 x 1.3 x 1.2 cm hypoechoic mass with indistinct margins at 2 o'clock 3 cm from the nipple.  Axillary ultrasound revealed normal nodes.  Bilateral breast MRI on 11/17/2017 revealed a 4.3 x 4.8 x 5 cm mass on the right centered in the upper outer quadrant but also extends into the lower outer and upper inner quadrants.  There was a small amount of enhancement posterior to the right nipple which could represent subtle extension. There was a cluster of nodules located posterior to the superior aspect of the known malignancy and another small nodule located posterior to the inferior aspect of the malignancy which could represent satellite lesions. Taking the enhancement posterior to the right nipple and the potential posterior satellite lesions into account, the AP dimension could be as large as 7 cm.  The patient's known DCIS spans 5 x 5 x 4.4 cm mammographically based on calcifications. The associated enhancement spans 3.2 x 3.2 x 3.3 cm. However, there were 2 small linearly enhancing regions at 6 o'clock in the left breast which could represent further disease.  There was extensive adenopathy in the right axilla and right retropectoral region. There was at least 1 significantly enlarged right internal mammary lymph node.  PET scan on 11/24/2017 revealed hypermetabolic right axillary (2.6 x 3.1 cm; SUV 8.0), subpectoral/internal mammary (7 mm; SUV 2.4) adenopathy.  There was a 4 mm right lower lobe nodule is too small for PET resolution.  There was cholelithiasis.  She is s/p 2 cycles of AC (12/05/2017 - 12/26/2017) with OnPro Neulasta support.  ANC nadir was 200 on day 8.  Bone density on 12/16/2016 revealed osteopenia with a T-score of -1.6 in the left femoral neck on 12/16/2017.  Symptomatically, she is doing well.  Nausea is well controlled. Exam is unremarkable. WBC is 1300 with an ANC of  200.  Plan: 1.  Labs today:  CBC with diff, CMP, Mg. 2.  Hold chemotherapy today due to counts. Reschedule cycle #3 AC with On-Pro for 01/16/2018.  3.  RTC on 01/30/2018 for MD assessment, labs (CBC with diff, CMP, Mg), and cycle #4 AC with On-Pro Neulasta.     Honor Loh, NP 01/09/2018, 9:43 AM   I saw  and evaluated the patient, participating in the key portions of the service and reviewing pertinent diagnostic studies and records.  I reviewed the nurse practitioner's note and agree with the findings and the plan.  The assessment and plan were discussed with the patient.  Several questions were asked by the patient and answered.   Nolon Stalls, MD 01/09/2018, 9:43 AM

## 2018-01-16 ENCOUNTER — Ambulatory Visit: Payer: 59

## 2018-01-16 ENCOUNTER — Ambulatory Visit: Payer: 59 | Admitting: Hematology and Oncology

## 2018-01-16 ENCOUNTER — Inpatient Hospital Stay: Payer: 59

## 2018-01-16 ENCOUNTER — Encounter: Payer: Self-pay | Admitting: Hematology and Oncology

## 2018-01-16 ENCOUNTER — Inpatient Hospital Stay: Payer: 59 | Attending: Hematology and Oncology

## 2018-01-16 ENCOUNTER — Inpatient Hospital Stay (HOSPITAL_BASED_OUTPATIENT_CLINIC_OR_DEPARTMENT_OTHER): Payer: 59 | Admitting: Hematology and Oncology

## 2018-01-16 ENCOUNTER — Other Ambulatory Visit: Payer: 59

## 2018-01-16 VITALS — BP 137/84 | HR 76 | Temp 97.4°F | Resp 18 | Wt 153.4 lb

## 2018-01-16 DIAGNOSIS — Z87891 Personal history of nicotine dependence: Secondary | ICD-10-CM | POA: Diagnosis not present

## 2018-01-16 DIAGNOSIS — K5900 Constipation, unspecified: Secondary | ICD-10-CM | POA: Insufficient documentation

## 2018-01-16 DIAGNOSIS — Z7189 Other specified counseling: Secondary | ICD-10-CM

## 2018-01-16 DIAGNOSIS — Z8614 Personal history of Methicillin resistant Staphylococcus aureus infection: Secondary | ICD-10-CM | POA: Diagnosis not present

## 2018-01-16 DIAGNOSIS — C50411 Malignant neoplasm of upper-outer quadrant of right female breast: Secondary | ICD-10-CM | POA: Diagnosis present

## 2018-01-16 DIAGNOSIS — Z800 Family history of malignant neoplasm of digestive organs: Secondary | ICD-10-CM | POA: Insufficient documentation

## 2018-01-16 DIAGNOSIS — Z801 Family history of malignant neoplasm of trachea, bronchus and lung: Secondary | ICD-10-CM | POA: Diagnosis not present

## 2018-01-16 DIAGNOSIS — Z170 Estrogen receptor positive status [ER+]: Secondary | ICD-10-CM | POA: Diagnosis not present

## 2018-01-16 DIAGNOSIS — G250 Essential tremor: Secondary | ICD-10-CM | POA: Diagnosis not present

## 2018-01-16 DIAGNOSIS — K8020 Calculus of gallbladder without cholecystitis without obstruction: Secondary | ICD-10-CM | POA: Diagnosis not present

## 2018-01-16 DIAGNOSIS — Z5111 Encounter for antineoplastic chemotherapy: Secondary | ICD-10-CM | POA: Insufficient documentation

## 2018-01-16 DIAGNOSIS — Z85828 Personal history of other malignant neoplasm of skin: Secondary | ICD-10-CM | POA: Insufficient documentation

## 2018-01-16 DIAGNOSIS — R599 Enlarged lymph nodes, unspecified: Secondary | ICD-10-CM | POA: Diagnosis not present

## 2018-01-16 DIAGNOSIS — Z79899 Other long term (current) drug therapy: Secondary | ICD-10-CM | POA: Insufficient documentation

## 2018-01-16 DIAGNOSIS — K1230 Oral mucositis (ulcerative), unspecified: Secondary | ICD-10-CM | POA: Insufficient documentation

## 2018-01-16 DIAGNOSIS — D0512 Intraductal carcinoma in situ of left breast: Secondary | ICD-10-CM

## 2018-01-16 LAB — COMPREHENSIVE METABOLIC PANEL
ALT: 16 U/L (ref 14–54)
AST: 29 U/L (ref 15–41)
Albumin: 3.9 g/dL (ref 3.5–5.0)
Alkaline Phosphatase: 69 U/L (ref 38–126)
Anion gap: 9 (ref 5–15)
BUN: 12 mg/dL (ref 6–20)
CO2: 22 mmol/L (ref 22–32)
Calcium: 8.9 mg/dL (ref 8.9–10.3)
Chloride: 107 mmol/L (ref 101–111)
Creatinine, Ser: 0.87 mg/dL (ref 0.44–1.00)
GFR calc Af Amer: >60 mL/min (ref >60)
GFR calc non Af Amer: >60 mL/min (ref >60)
Glucose, Bld: 132 mg/dL — ABNORMAL HIGH (ref 65–99)
Potassium: 3.9 mmol/L (ref 3.5–5.1)
Sodium: 138 mmol/L (ref 135–145)
Total Bilirubin: 0.5 mg/dL (ref 0.3–1.2)
Total Protein: 6.9 g/dL (ref 6.5–8.1)

## 2018-01-16 LAB — CBC WITH DIFFERENTIAL/PLATELET
Basophils Absolute: 0.1 10*3/uL (ref 0–0.1)
Basophils Relative: 2 %
Eosinophils Absolute: 0.1 10*3/uL (ref 0–0.7)
Eosinophils Relative: 1 %
HCT: 37.1 % (ref 35.0–47.0)
Hemoglobin: 13 g/dL (ref 12.0–16.0)
Lymphocytes Relative: 22 %
Lymphs Abs: 0.9 10*3/uL — ABNORMAL LOW (ref 1.0–3.6)
MCH: 30.5 pg (ref 26.0–34.0)
MCHC: 35.1 g/dL (ref 32.0–36.0)
MCV: 86.9 fL (ref 80.0–100.0)
Monocytes Absolute: 0.8 10*3/uL (ref 0.2–0.9)
Monocytes Relative: 20 %
Neutro Abs: 2.3 10*3/uL (ref 1.4–6.5)
Neutrophils Relative %: 55 %
Platelets: 282 10*3/uL (ref 150–440)
RBC: 4.27 MIL/uL (ref 3.80–5.20)
RDW: 13.6 % (ref 11.5–14.5)
WBC: 4.1 10*3/uL (ref 3.6–11.0)

## 2018-01-16 LAB — MAGNESIUM: Magnesium: 1.9 mg/dL (ref 1.7–2.4)

## 2018-01-16 MED ORDER — DOXORUBICIN HCL CHEMO IV INJECTION 2 MG/ML
60.0000 mg/m2 | Freq: Once | INTRAVENOUS | Status: AC
  Administered 2018-01-16: 108 mg via INTRAVENOUS
  Filled 2018-01-16: qty 54

## 2018-01-16 MED ORDER — SODIUM CHLORIDE 0.9 % IV SOLN
Freq: Once | INTRAVENOUS | Status: AC
  Administered 2018-01-16: 10:00:00 via INTRAVENOUS
  Filled 2018-01-16: qty 1000

## 2018-01-16 MED ORDER — PEGFILGRASTIM 6 MG/0.6ML ~~LOC~~ PSKT
6.0000 mg | PREFILLED_SYRINGE | Freq: Once | SUBCUTANEOUS | Status: AC
  Administered 2018-01-16: 6 mg via SUBCUTANEOUS
  Filled 2018-01-16: qty 0.6

## 2018-01-16 MED ORDER — SODIUM CHLORIDE 0.9 % IV SOLN
1000.0000 mg | Freq: Once | INTRAVENOUS | Status: AC
  Administered 2018-01-16: 1000 mg via INTRAVENOUS
  Filled 2018-01-16: qty 50

## 2018-01-16 MED ORDER — PALONOSETRON HCL INJECTION 0.25 MG/5ML
0.2500 mg | Freq: Once | INTRAVENOUS | Status: AC
  Administered 2018-01-16: 0.25 mg via INTRAVENOUS
  Filled 2018-01-16: qty 5

## 2018-01-16 MED ORDER — HEPARIN SOD (PORK) LOCK FLUSH 100 UNIT/ML IV SOLN
500.0000 [IU] | Freq: Once | INTRAVENOUS | Status: AC | PRN
  Administered 2018-01-16: 500 [IU]
  Filled 2018-01-16 (×2): qty 5

## 2018-01-16 MED ORDER — SODIUM CHLORIDE 0.9 % IV SOLN
Freq: Once | INTRAVENOUS | Status: AC
  Administered 2018-01-16: 11:00:00 via INTRAVENOUS
  Filled 2018-01-16: qty 5

## 2018-01-16 NOTE — Progress Notes (Signed)
Patient offers no complaints. Patient states she does not having numbness and tingling in her fingers, however, she is dropping things.

## 2018-01-16 NOTE — Progress Notes (Signed)
[facility] day:  01/16/2018   Chief Complaint: Megan Vega is a 62 y.o. female with stage IIIB right breast cancer and DCIS of the left breast who is seen for assessment prior to cycle #3 AC.  HPI: The patient was last seen in the medical oncology clinic on 01/09/2018.  At that time, she was doing well.  Nausea was well controlled. Exam was stable. WBC was 1300 with an ANC of 200. Chemotherapy was held secondary to neutropenia.  During the interim, she has "looked and felt good".  She denies any nausea, vomiting, or diarrhea.  She comments that she has been a "little clumsy".  She has dropped things from her right hand.  She denies any numbness, tingling, headache, balance or coordination issues.  She has previously seen Dr Manuella Ghazi, neurologist, for benign essential tremor.   Past Medical History:  Diagnosis Date  . Cancer ([place])    Basal Cell Carcinoma, about 2011  . Cataract   . Family history of adverse reaction to anesthesia    DAUGHTER-N/V  . Hemorrhoids   . History of methicillin resistant staphylococcus aureus (MRSA) 2015  . Migraines    MIGRAINES    Past Surgical History:  Procedure Laterality Date  . BREAST BIOPSY Bilateral 10/29/2017   rt core with axilla left core also path pending  . CESAREAN SECTION    . COLONOSCOPY  2012  . MANDIBLE FRACTURE SURGERY    . PORTACATH PLACEMENT Left 11/28/2017   Procedure: INSERTION PORT-A-CATH;  Surgeon: Robert Bellow, MD;  Location: [facility] ORS;  Service: General;  Laterality: Left;    Family History  Problem Relation Age of Onset  . Cancer Mother        Esophageal Cancer  . Early death Mother        Age 86  . Cancer Father        Lung Cancer  . Diabetes Father   . Diabetes Brother        Controlled with diet  . Heart attack Paternal Grandfather     Social History:  reports that she quit smoking about 41 years ago. She has a 2.50 pack-year smoking history. she has never used  smokeless tobacco. She reports that she drinks about 0.6 oz of alcohol per week. She reports that she does not use drugs.  Patient is a former smoker for 20 years; only smoked "about 3 cigarettes a day"; stopped 30 years ago. Patient drinks alcohol very infrequently; "about 1 glass a month". Patient currently employed as an Web designer at [employer] She denies any known exposures to radiations or toxins.  She lives in [place].  Her grand daughter is graduating in 03/2018.  She has one daughter [name]).  She is accompanied by her daughter today.  Allergies:  Allergies  Allergen Reactions  . Peanut-Containing Drug Products Swelling and Other (See Comments)    Only when eating too many peanuts, swelling with lips and tingly face    Current Medications: Current Outpatient Medications  Medication Sig Dispense Refill  . acetaminophen (TYLENOL) 500 MG tablet Take 1,000 mg by mouth every 6 (six) hours as needed for moderate pain or headache.    . b complex vitamins tablet Take 1 tablet by mouth daily.    . Bilberry, Vaccinium myrtillus, (BILBERRY PO) Take 1 capsule by mouth daily.     . Calcium Carbonate-Vit D-Min (CALCIUM 1200 PO) Take 1 tablet by mouth daily.     Marland Kitchen  dexamethasone (DECADRON) 4 MG tablet Take 2 tablets by mouth once a day on the day after chemotherapy and then take 2 tablets two times a day for 2 days. Take with food. 30 tablet 1  . Flaxseed, Linseed, (FLAX SEED OIL PO) Take 1 capsule by mouth daily.     . Lactobacillus (PROBIOTIC ACIDOPHILUS PO) Take 1 capsule by mouth daily.     Marland Kitchen lidocaine-prilocaine (EMLA) cream Apply to affected area once 30 g 3  . loratadine (CLARITIN) 10 MG tablet Take 10 mg by mouth daily.    . magnesium gluconate (MAGONATE) 500 MG tablet Take 500 mg by mouth daily.     . Multiple Vitamins-Minerals (HAIR/SKIN/NAILS/BIOTIN PO) Take 1 tablet by mouth daily.     . ondansetron (ZOFRAN) 8 MG tablet Take 1 tablet (8 mg total) by mouth 2  (two) times daily as needed. Start on the third day after chemotherapy. 30 tablet 1  . TURMERIC PO Take 1 capsule by mouth daily.     Marland Kitchen LORazepam (ATIVAN) 0.5 MG tablet Take 1 tablet (0.5 mg total) by mouth every 6 (six) hours as needed (Nausea or vomiting). (Patient not taking: Reported on 12/19/2017) 30 tablet 0   No current facility-administered medications for this visit.    Facility-Administered Medications Ordered in Other Visits  Medication Dose Route Frequency Provider Last Rate Last Dose  . cyclophosphamide (CYTOXAN) 1,000 mg in sodium chloride 0.9 % 250 mL chemo infusion  1,000 mg Intravenous Once Nolon Stalls C, MD 600 mL/hr at 01/16/18 1130 1,000 mg at 01/16/18 1130  . heparin lock flush 100 unit/mL  500 Units Intracatheter Once PRN Corcoran, Melissa C, MD      . pegfilgrastim (NEULASTA ONPRO KIT) injection 6 mg  6 mg Subcutaneous Once Lequita Asal, MD        Review of Systems:  GENERAL:  Feels "good".  No fevers, chills or sweats. Weight down 1 pound.  PERFORMANCE STATUS (ECOG):  1 HEENT:  No visual changes, runny nose, sore throat, mouth sores or tenderness. Lungs: No shortness of breath or cough.  No hemoptysis. Cardiac:  No chest pain, palpitations, orthopnea, or PND. GI:  No nausea, vomiting, diarrhea, constipation, melena or hematochezia.  GU:  No urgency, frequency, dysuria, or hematuria. Musculoskeletal:  No back pain.  No joint pain.  No muscle tenderness. Extremities:  No pain or swelling. Skin:  No rashes or skin changes. Neuro:  h/o migraine headaches.  Essential tremors.  Dropping things in right hand.  No numbness or weakness, balance or coordination issues. Endocrine:  No diabetes, thyroid issues, hot flashes or night sweats. Psych:  No mood changes, depression or anxiety. Pain:  No focal pain.  Review of systems:  All other systems reviewed and found to be negative.  Physical Exam: Blood pressure 137/84, pulse 76, temperature (!) 97.4 F (36.3 C),  temperature source Tympanic, resp. rate 18, weight 153 lb 7 oz (69.6 kg). GENERAL:  Well developed, well nourished, woman sitting comfortably in the exam room in no acute distress. MENTAL STATUS:  Alert and oriented to person, place and time. HEAD:  Short strawberry blonde wig.  Normocephalic, atraumatic, face symmetric, no Cushingoid features. EYES:  Dark green eyes.  Pupils equal round and reactive to light and accomodation.  No conjunctivitis or scleral icterus. ENT:  Oropharynx clear without lesion.  Tongue normal. Mucous membranes moist.  RESPIRATORY:  Clear to auscultation without rales, wheezes or rhonchi. CARDIOVASCULAR:  Regular rate and rhythm without murmur, rub or  gallop. CHEST:  Left sided port-a-cath accessed.  No surrounding erythema or induration. ABDOMEN:  Soft, non-tender, with active bowel sounds, and no hepatosplenomegaly.  No masses. SKIN:  No rashes, ulcers or lesions. EXTREMITIES: No edema, no skin discoloration or tenderness.  No palpable cords. LYMPH NODES:  No palpable cervical, supraclavicular, axillary or inguinal adenopathy  NEUROLOGICAL: Alert & oriented, face symmetric; motor strength 5/5 throughout upper extremities; sensation intact; finger to nose and RAM normal on right side with slight tremulousness on left; bilateral upper extremities intact.  Gait normal. PSYCH:  Appropriate.   Results for orders placed or performed in visit on 01/16/18 (from the past 24 hour(s))  CBC with Differential     Status: Abnormal   Collection Time: 01/16/18  8:31 AM  Result Value Ref Range   WBC 4.1 3.6 - 11.0 K/uL   RBC 4.27 3.80 - 5.20 MIL/uL   Hemoglobin 13.0 12.0 - 16.0 g/dL   HCT 37.1 35.0 - 47.0 %   MCV 86.9 80.0 - 100.0 fL   MCH 30.5 26.0 - 34.0 pg   MCHC 35.1 32.0 - 36.0 g/dL   RDW 13.6 11.5 - 14.5 %   Platelets 282 150 - 440 K/uL   Neutrophils Relative % 55 %   Neutro Abs 2.3 1.4 - 6.5 K/uL   Lymphocytes Relative 22 %   Lymphs Abs 0.9 (L) 1.0 - 3.6 K/uL    Monocytes Relative 20 %   Monocytes Absolute 0.8 0.2 - 0.9 K/uL   Eosinophils Relative 1 %   Eosinophils Absolute 0.1 0 - 0.7 K/uL   Basophils Relative 2 %   Basophils Absolute 0.1 0 - 0.1 K/uL  Comprehensive metabolic panel     Status: Abnormal   Collection Time: 01/16/18  8:31 AM  Result Value Ref Range   Sodium 138 135 - 145 mmol/L   Potassium 3.9 3.5 - 5.1 mmol/L   Chloride 107 101 - 111 mmol/L   CO2 22 22 - 32 mmol/L   Glucose, Bld 132 (H) 65 - 99 mg/dL   BUN 12 6 - 20 mg/dL   Creatinine, Ser 0.87 0.44 - 1.00 mg/dL   Calcium 8.9 8.9 - 10.3 mg/dL   Total Protein 6.9 6.5 - 8.1 g/dL   Albumin 3.9 3.5 - 5.0 g/dL   AST 29 15 - 41 U/L   ALT 16 14 - 54 U/L   Alkaline Phosphatase 69 38 - 126 U/L   Total Bilirubin 0.5 0.3 - 1.2 mg/dL   GFR calc non Af Amer >60 >60 mL/min   GFR calc Af Amer >60 >60 mL/min   Anion gap 9 5 - 15  Magnesium     Status: None   Collection Time: 01/16/18  8:31 AM  Result Value Ref Range   Magnesium 1.9 1.7 - 2.4 mg/dL     Assessment:  Rut Betterton is a 62 y.o. female with clinical stage [id number] right breast cancer and DCIS in the left breast s/p ultrasound guided biopsy on 10/29/2017. Pathology from the right breast at the 10 o'clock position 3 cm from the nipple revealed a 1.4 cm grade II invasive mammary carcinoma of no special type.  Tumor was ER + (> 90%), PR + (90%) and Her2/neu 1+.  Right axillary node biopsy revealed metastatic carcinoma.  Left breast biopsy at the 2 o'clock position 3 cm from the nipple revealed high grade DCIS with comedonecrosis.  CA27.29 was 23.5 on 11/14/2017.  Bilateral diagnostic mammogram and ultrasound on 10/23/2017 revealed a  3.9 x 1.7 x 2.2 cm irregular hypoechoic mass with associated areas of vascularity at the 10 o'clock position 3 cm from the nipple and a 1.1 x 0.5 x 0.4 cm area of nodularity (3 approximately 3 mm nodules at 10 o'clock 6 cm from the nipple in the right breast. The mass was within a 5 cm area of  pleomorphic microcalcifications.  Ultrasound revealed at least 5 prominent lymph nodes with diffuse thickened and hypoechoic cortices suggestive of metastatic disease.  In the left breast, there was an irregular 1.6 x 1.3 x 1.2 cm hypoechoic mass with indistinct margins at 2 o'clock 3 cm from the nipple.  Axillary ultrasound revealed normal nodes.  Bilateral breast MRI on 11/17/2017 revealed a 4.3 x 4.8 x 5 cm mass on the right centered in the upper outer quadrant but also extends into the lower outer and upper inner quadrants.  There was a small amount of enhancement posterior to the right nipple which could represent subtle extension. There was a cluster of nodules located posterior to the superior aspect of the known malignancy and another small nodule located posterior to the inferior aspect of the malignancy which could represent satellite lesions. Taking the enhancement posterior to the right nipple and the potential posterior satellite lesions into account, the AP dimension could be as large as 7 cm.  The patient's known DCIS spans 5 x 5 x 4.4 cm mammographically based on calcifications. The associated enhancement spans 3.2 x 3.2 x 3.3 cm. However, there were 2 small linearly enhancing regions at 6 o'clock in the left breast which could represent further disease.  There was extensive adenopathy in the right axilla and right retropectoral region. There was at least 1 significantly enlarged right internal mammary lymph node.  PET scan on 11/24/2017 revealed hypermetabolic right axillary (2.6 x 3.1 cm; SUV 8.0), subpectoral/internal mammary (7 mm; SUV 2.4) adenopathy.  There was a 4 mm right lower lobe nodule is too small for PET resolution.  There was cholelithiasis.  She is s/p 2 cycles of AC (12/05/2017 - 12/26/2017) with OnPro Neulasta support.  ANC nadir was 200 on day 8 of cycle #1.  ANC was 200 on day 15 of cycle #2.  Bone density on 12/16/2016 revealed osteopenia with a T-score of -1.6 in the  left femoral neck on 12/16/2017.  Symptomatically, she is doing well.  She has a tremor (old) and has dropped things from her right hand.  Neurologic exam reveals slight tremor in left upper extremity.  WBC is 4100 with an [place] of 2100.  Plan: 1.  Labs today:  CBC with diff, CMP, Mg. 2.  Cycle #3 AC with On-Pro Neulasta.  3.  Discuss recent "clumsiness" and h/o tremor.  Non-focal exam.  Discuss follow-up with neurology.  Consider head imaging. 4.  Discuss neutropenic precautions given history of low counts with each cycle. 5.  Discuss future chemotherapy.  Discuss plan for 1 more cycle of AC (in 2-3 weeks based on counts) then weekly Taxol x 12.  Projected date of surgery discussed. 6.  RTC in 2 weeks for [place] [facility], NP assessment, labs (CBC with diff, CMP, Mg), and cycle #4 AC with On-Pro Neulasta.     Honor Loh, NP 01/16/2018, 11:29 AM   I saw and evaluated the patient, participating in the key portions of the service and reviewing pertinent diagnostic studies and records.  I reviewed the nurse practitioner's note and agree with the findings and the plan.  The assessment and plan  were discussed with the patient.  Several questions were asked by the patient and answered.   Nolon Stalls, MD 01/16/2018, 11:29 AM

## 2018-01-28 ENCOUNTER — Other Ambulatory Visit: Payer: Self-pay | Admitting: Hematology and Oncology

## 2018-01-30 ENCOUNTER — Inpatient Hospital Stay (HOSPITAL_BASED_OUTPATIENT_CLINIC_OR_DEPARTMENT_OTHER): Payer: 59 | Admitting: Urgent Care

## 2018-01-30 ENCOUNTER — Encounter: Payer: Self-pay | Admitting: Urgent Care

## 2018-01-30 ENCOUNTER — Inpatient Hospital Stay: Payer: 59

## 2018-01-30 ENCOUNTER — Other Ambulatory Visit: Payer: Self-pay

## 2018-01-30 VITALS — BP 128/74 | HR 83 | Temp 98.3°F | Wt 154.3 lb

## 2018-01-30 DIAGNOSIS — Z87891 Personal history of nicotine dependence: Secondary | ICD-10-CM | POA: Diagnosis not present

## 2018-01-30 DIAGNOSIS — R599 Enlarged lymph nodes, unspecified: Secondary | ICD-10-CM | POA: Diagnosis not present

## 2018-01-30 DIAGNOSIS — K8020 Calculus of gallbladder without cholecystitis without obstruction: Secondary | ICD-10-CM

## 2018-01-30 DIAGNOSIS — Z170 Estrogen receptor positive status [ER+]: Secondary | ICD-10-CM

## 2018-01-30 DIAGNOSIS — K5900 Constipation, unspecified: Secondary | ICD-10-CM | POA: Diagnosis not present

## 2018-01-30 DIAGNOSIS — Z79899 Other long term (current) drug therapy: Secondary | ICD-10-CM | POA: Diagnosis not present

## 2018-01-30 DIAGNOSIS — Z800 Family history of malignant neoplasm of digestive organs: Secondary | ICD-10-CM | POA: Diagnosis not present

## 2018-01-30 DIAGNOSIS — Z85828 Personal history of other malignant neoplasm of skin: Secondary | ICD-10-CM | POA: Diagnosis not present

## 2018-01-30 DIAGNOSIS — C50411 Malignant neoplasm of upper-outer quadrant of right female breast: Secondary | ICD-10-CM | POA: Diagnosis not present

## 2018-01-30 DIAGNOSIS — Z8614 Personal history of Methicillin resistant Staphylococcus aureus infection: Secondary | ICD-10-CM | POA: Diagnosis not present

## 2018-01-30 DIAGNOSIS — K649 Unspecified hemorrhoids: Secondary | ICD-10-CM | POA: Insufficient documentation

## 2018-01-30 DIAGNOSIS — B019 Varicella without complication: Secondary | ICD-10-CM | POA: Insufficient documentation

## 2018-01-30 DIAGNOSIS — Z801 Family history of malignant neoplasm of trachea, bronchus and lung: Secondary | ICD-10-CM

## 2018-01-30 DIAGNOSIS — C44509 Unspecified malignant neoplasm of skin of other part of trunk: Secondary | ICD-10-CM | POA: Insufficient documentation

## 2018-01-30 DIAGNOSIS — Z5111 Encounter for antineoplastic chemotherapy: Secondary | ICD-10-CM

## 2018-01-30 LAB — CBC WITH DIFFERENTIAL/PLATELET
Basophils Absolute: 0.1 10*3/uL (ref 0–0.1)
Basophils Relative: 1 %
Eosinophils Absolute: 0 10*3/uL (ref 0–0.7)
Eosinophils Relative: 1 %
HCT: 35.2 % (ref 35.0–47.0)
Hemoglobin: 12.4 g/dL (ref 12.0–16.0)
Lymphocytes Relative: 14 %
Lymphs Abs: 0.9 10*3/uL — ABNORMAL LOW (ref 1.0–3.6)
MCH: 30.5 pg (ref 26.0–34.0)
MCHC: 35.3 g/dL (ref 32.0–36.0)
MCV: 86.2 fL (ref 80.0–100.0)
Monocytes Absolute: 0.7 10*3/uL (ref 0.2–0.9)
Monocytes Relative: 11 %
Neutro Abs: 4.8 10*3/uL (ref 1.4–6.5)
Neutrophils Relative %: 73 %
Platelets: 171 10*3/uL (ref 150–440)
RBC: 4.09 MIL/uL (ref 3.80–5.20)
RDW: 13.6 % (ref 11.5–14.5)
WBC: 6.6 10*3/uL (ref 3.6–11.0)

## 2018-01-30 LAB — MAGNESIUM: Magnesium: 2 mg/dL (ref 1.7–2.4)

## 2018-01-30 LAB — COMPREHENSIVE METABOLIC PANEL
ALT: 26 U/L (ref 14–54)
AST: 24 U/L (ref 15–41)
Albumin: 3.6 g/dL (ref 3.5–5.0)
Alkaline Phosphatase: 84 U/L (ref 38–126)
Anion gap: 8 (ref 5–15)
BUN: 10 mg/dL (ref 6–20)
CO2: 23 mmol/L (ref 22–32)
Calcium: 8.8 mg/dL — ABNORMAL LOW (ref 8.9–10.3)
Chloride: 108 mmol/L (ref 101–111)
Creatinine, Ser: 0.84 mg/dL (ref 0.44–1.00)
GFR calc Af Amer: >60 mL/min (ref >60)
GFR calc non Af Amer: >60 mL/min (ref >60)
Glucose, Bld: 123 mg/dL — ABNORMAL HIGH (ref 65–99)
Potassium: 3.8 mmol/L (ref 3.5–5.1)
Sodium: 139 mmol/L (ref 135–145)
Total Bilirubin: 0.7 mg/dL (ref 0.3–1.2)
Total Protein: 6.5 g/dL (ref 6.5–8.1)

## 2018-01-30 MED ORDER — SODIUM CHLORIDE 0.9% FLUSH
10.0000 mL | INTRAVENOUS | Status: DC | PRN
  Administered 2018-01-30: 10 mL via INTRAVENOUS
  Filled 2018-01-30: qty 10

## 2018-01-30 MED ORDER — SODIUM CHLORIDE 0.9 % IV SOLN
Freq: Once | INTRAVENOUS | Status: AC
  Administered 2018-01-30: 10:00:00 via INTRAVENOUS
  Filled 2018-01-30: qty 5

## 2018-01-30 MED ORDER — HEPARIN SOD (PORK) LOCK FLUSH 100 UNIT/ML IV SOLN
500.0000 [IU] | Freq: Once | INTRAVENOUS | Status: AC
  Administered 2018-01-30: 500 [IU] via INTRAVENOUS
  Filled 2018-01-30: qty 5

## 2018-01-30 MED ORDER — PALONOSETRON HCL INJECTION 0.25 MG/5ML
0.2500 mg | Freq: Once | INTRAVENOUS | Status: AC
  Administered 2018-01-30: 0.25 mg via INTRAVENOUS
  Filled 2018-01-30: qty 5

## 2018-01-30 MED ORDER — PEGFILGRASTIM 6 MG/0.6ML ~~LOC~~ PSKT
6.0000 mg | PREFILLED_SYRINGE | Freq: Once | SUBCUTANEOUS | Status: AC
  Administered 2018-01-30: 6 mg via SUBCUTANEOUS
  Filled 2018-01-30: qty 0.6

## 2018-01-30 MED ORDER — HEPARIN SOD (PORK) LOCK FLUSH 100 UNIT/ML IV SOLN: 500.0000 [IU] | Freq: Once | INTRAVENOUS | Status: DC | PRN

## 2018-01-30 MED ORDER — DOXORUBICIN HCL CHEMO IV INJECTION 2 MG/ML
60.0000 mg/m2 | Freq: Once | INTRAVENOUS | Status: AC
  Administered 2018-01-30: 108 mg via INTRAVENOUS
  Filled 2018-01-30: qty 50

## 2018-01-30 MED ORDER — SODIUM CHLORIDE 0.9 % IV SOLN
1000.0000 mg | Freq: Once | INTRAVENOUS | Status: AC
  Administered 2018-01-30: 1000 mg via INTRAVENOUS
  Filled 2018-01-30: qty 50

## 2018-01-30 MED ORDER — SODIUM CHLORIDE 0.9 % IV SOLN
Freq: Once | INTRAVENOUS | Status: AC
  Administered 2018-01-30: 09:00:00 via INTRAVENOUS
  Filled 2018-01-30: qty 1000

## 2018-01-30 NOTE — Progress Notes (Addendum)
[facility] day:  01/30/2018   Chief Complaint: Megan Vega is a 62 y.o. female with stage IIIB right breast cancer and DCIS of the left breast who is seen for assessment prior to cycle #4 AC.  HPI: The patient was last seen in the medical oncology clinic on 01/16/2018.  At that time, patient noted that she has "looked and felt good".  Patient denied any symptoms associated with her chemotherapy treatment.  Patient noted that she had been a "little clumsy", however denied numbness, tingling, headache, balance or coordination issues.  Of note, patient previously seen by neurology and diagnosed with benign essential tremors.  Exam revealed slight tremor to the left upper extremity. WBC is 4100 with an [place] of 2300.  She received cycle #3 AC with OnPro Neulasta.  In the interim, patient has continued to do well overall.  She notes some mild tingling in her hands.  Patient has experienced constipation for which she is taking MiraLAX.  She notes that this intervention is effective in improving her bowel movements.  Patient experienced bone "aching" x1 night following her Neulasta injection.  Patient noted that the pain was worse when she sat down, and markedly improved when she got up and moved around.  Patient has been taking the recommended loratadine, and advises that this is the first time that this has happened to her.  She is taking Tylenol, on an as-needed basis, which she notes manages her pain effectively.  Patient denies pain in the clinic today.  Patient denies B symptoms.  She verbalizes no concerns with regards to her breast today.  Patient has not experienced any interval infections.  She continues to eat well.  Patient's weight has increased by 1 pound since her last visit to the clinic.  Past Medical History:  Diagnosis Date  . Cancer ([place])    Basal Cell Carcinoma, about 2011  . Cataract   . Family history of adverse reaction to anesthesia     DAUGHTER-N/V  . Hemorrhoids   . History of methicillin resistant staphylococcus aureus (MRSA) 2015  . Migraines    MIGRAINES    Past Surgical History:  Procedure Laterality Date  . BREAST BIOPSY Bilateral 10/29/2017   rt core with axilla left core also path pending  . CESAREAN SECTION    . COLONOSCOPY  2012  . MANDIBLE FRACTURE SURGERY    . PORTACATH PLACEMENT Left 11/28/2017   Procedure: INSERTION PORT-A-CATH;  Surgeon: Robert Bellow, MD;  Location: [facility] ORS;  Service: General;  Laterality: Left;    Family History  Problem Relation Age of Onset  . Cancer Mother        Esophageal Cancer  . Early death Mother        Age 47  . Cancer Father        Lung Cancer  . Diabetes Father   . Diabetes Brother        Controlled with diet  . Heart attack Paternal Grandfather     Social History:  reports that she quit smoking about 41 years ago. She has a 2.50 pack-year smoking history. she has never used smokeless tobacco. She reports that she drinks about 0.6 oz of alcohol per week. She reports that she does not use drugs.  Patient is a former smoker for 20 years; only smoked "about 3 cigarettes a day"; stopped 30 years ago. Patient drinks alcohol very infrequently; "about 1 glass a month". 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 She denies any known exposures to radiations or toxins.  She lives in [place].  Her grand daughter is graduating in 03/2018.  She has one daughter Megan Vega).  She is accompanied by her daughter today.  Allergies:  Allergies  Allergen Reactions  . Peanut-Containing Drug Products Swelling and Other (See Comments)    Only when eating too many peanuts, swelling with lips and tingly face    Current Medications: Current Outpatient Medications  Medication Sig Dispense Refill  . acetaminophen (TYLENOL) 500 MG tablet Take 1,000 mg by mouth every 6 (six) hours as needed for moderate pain or headache.    . b complex  vitamins tablet Take 1 tablet by mouth daily.    Marland Kitchen dexamethasone (DECADRON) 4 MG tablet Take 2 tablets by mouth once a day on the day after chemotherapy and then take 2 tablets two times a day for 2 days. Take with food. 30 tablet 1  . lidocaine-prilocaine (EMLA) cream Apply to affected area once 30 g 3  . loratadine (CLARITIN) 10 MG tablet Take 10 mg by mouth daily.    Marland Kitchen LORazepam (ATIVAN) 0.5 MG tablet Take 1 tablet (0.5 mg total) by mouth every 6 (six) hours as needed (Nausea or vomiting). 30 tablet 0  . Multiple Vitamins-Minerals (HAIR/SKIN/NAILS/BIOTIN PO) Take 1 tablet by mouth daily.     . ondansetron (ZOFRAN) 8 MG tablet Take 1 tablet (8 mg total) by mouth 2 (two) times daily as needed. Start on the third day after chemotherapy. 30 tablet 1  . polyethylene glycol (MIRALAX / GLYCOLAX) packet Take 17 g by mouth daily as needed for mild constipation.     No current facility-administered medications for this visit.    Facility-Administered Medications Ordered in Other Visits  Medication Dose Route Frequency Provider Last Rate Last Dose  . heparin lock flush 100 unit/mL  500 Units Intravenous Once Corcoran, Melissa C, MD      . sodium chloride flush (NS) 0.9 % injection 10 mL  10 mL Intravenous PRN Lequita Asal, MD   10 mL at 01/30/18 0827    Review of Systems:  GENERAL:  Feels "good".  No fevers, chills or sweats. Weight up 1 pound.  PERFORMANCE STATUS (ECOG):  1 HEENT:  No visual changes, runny nose, sore throat, mouth sores or tenderness. Lungs: No shortness of breath or cough.  No hemoptysis. Cardiac:  No chest pain, palpitations, orthopnea, or PND. GI: mild constipation; improved with Miralax. No nausea, vomiting, diarrhea, melena or hematochezia.  GU:  No urgency, frequency, dysuria, or hematuria. Musculoskeletal: Single episode of Neulasta-induced bone pain; controlled with APAP. No back pain.  No joint pain.  No muscle tenderness. Extremities:  No pain or swelling. Skin:   No rashes or skin changes. Neuro:  h/o migraine headaches.  Essential tremors.  "Tingling" in hands; stable. No numbness or weakness, balance or coordination issues. Endocrine:  No diabetes, thyroid issues, hot flashes or night sweats. Psych:  No mood changes, depression or anxiety. Pain:  No focal pain.  Review of systems:  All other systems reviewed and found to be negative.  Physical Exam: Blood pressure 128/74, pulse 83, temperature 98.3 F (36.8 C), temperature source Tympanic, weight 154 lb 4.8 oz (70 kg). GENERAL:  Well developed, well nourished, woman sitting comfortably in the exam room in no acute distress. MENTAL STATUS:  Alert and oriented to person, place and time. HEAD:  Short strawberry blonde wig.  Normocephalic, atraumatic, face symmetric, no  Cushingoid features. EYES:  Dark green eyes.  Pupils equal round and reactive to light and accomodation.  No conjunctivitis or scleral icterus. ENT:  Oropharynx clear without lesion.  Tongue normal. Mucous membranes moist.  RESPIRATORY:  Clear to auscultation without rales, wheezes or rhonchi. CARDIOVASCULAR:  Regular rate and rhythm without murmur, rub or gallop. CHEST:  Left sided port-a-cath accessed.  No surrounding erythema or induration. ABDOMEN:  Soft, non-tender, with active bowel sounds, and no hepatosplenomegaly.  No masses. SKIN:  No rashes, ulcers or lesions. EXTREMITIES: No edema, no skin discoloration or tenderness.  No palpable cords. LYMPH NODES:  No palpable cervical, supraclavicular, axillary or inguinal adenopathy  NEUROLOGICAL: Alert & oriented, face symmetric; motor strength 5/5 throughout upper extremities; sensation intact; finger to nose normal. Slight hand tremor on left. Gait normal. PSYCH:  Appropriate.   No results found for this or any previous visit (from the past 24 hour(s)).   Assessment:  Octa Uplinger is a 62 y.o. female with clinical stage [id number] right breast cancer and DCIS in the left breast  s/p ultrasound guided biopsy on 10/29/2017. Pathology from the right breast at the 10 o'clock position 3 cm from the nipple revealed a 1.4 cm grade II invasive mammary carcinoma of no special type.  Tumor was ER + (> 90%), PR + (90%) and Her2/neu 1+.  Right axillary node biopsy revealed metastatic carcinoma.  Left breast biopsy at the 2 o'clock position 3 cm from the nipple revealed high grade DCIS with comedonecrosis.  CA27.29 was 23.5 on 11/14/2017.  Bilateral diagnostic mammogram and ultrasound on 10/23/2017 revealed a 3.9 x 1.7 x 2.2 cm irregular hypoechoic mass with associated areas of vascularity at the 10 o'clock position 3 cm from the nipple and a 1.1 x 0.5 x 0.4 cm area of nodularity (3 approximately 3 mm nodules at 10 o'clock 6 cm from the nipple in the right breast. The mass was within a 5 cm area of pleomorphic microcalcifications.  Ultrasound revealed at least 5 prominent lymph nodes with diffuse thickened and hypoechoic cortices suggestive of metastatic disease.  In the left breast, there was an irregular 1.6 x 1.3 x 1.2 cm hypoechoic mass with indistinct margins at 2 o'clock 3 cm from the nipple.  Axillary ultrasound revealed normal nodes.  Bilateral breast MRI on 11/17/2017 revealed a 4.3 x 4.8 x 5 cm mass on the right centered in the upper outer quadrant but also extends into the lower outer and upper inner quadrants.  There was a small amount of enhancement posterior to the right nipple which could represent subtle extension. There was a cluster of nodules located posterior to the superior aspect of the known malignancy and another small nodule located posterior to the inferior aspect of the malignancy which could represent satellite lesions. Taking the enhancement posterior to the right nipple and the potential posterior satellite lesions into account, the AP dimension could be as large as 7 cm.  The patient's known DCIS spans 5 x 5 x 4.4 cm mammographically based on calcifications. The  associated enhancement spans 3.2 x 3.2 x 3.3 cm. However, there were 2 small linearly enhancing regions at 6 o'clock in the left breast which could represent further disease.  There was extensive adenopathy in the right axilla and right retropectoral region. There was at least 1 significantly enlarged right internal mammary lymph node.  PET scan on 11/24/2017 revealed hypermetabolic right axillary (2.6 x 3.1 cm; SUV 8.0), subpectoral/internal mammary (7 mm; SUV 2.4) adenopathy.  There was  a 4 mm right lower lobe nodule is too small for PET resolution.  There was cholelithiasis.  She is s/p 3 cycles of AC (12/05/2017 - 01/16/2018) with OnPro Neulasta support.  ANC nadir was 200 on day 8 of cycle #1.  ANC was 200 on day 15 of cycle #2.  Bone density on 12/16/2016 revealed osteopenia with a T-score of -1.6 in the left femoral neck on 12/16/2017.  Symptomatically, she is doing well overall. Patient has some constipation that has improved with the use of Miralax. Patient experienced a single episode of Neulasta induced bone pain following her last injection. Patient has a "tingling" sensation in her hands. Neurologic exam reveals slight tremor in left upper extremity. Exam otherwise stable.  WBC 6.6 with an ANC of 4800.  Hemoglobin 12.4, hematocrit 35.2, and platelets 171,000.  Plan: 1.  Labs today:  CBC with diff, CMP, Mg. 2.  Labs reviewed. Blood counts stable and adequate enough for treatment. Will proceed with cycle #4 AC with On-Pro Neulasta today as planned.  3.  Review neutropenic precautions given history of low counts with each cycle. 4.  Discuss symptom management.  Patient has antiemetics and pain medications at home to use.  She notes that the  prescribed interventions are adequate at this point. 5.  Discuss future chemotherapy.  Discuss plan to begin weekly Taxol x 12 after this cycle.  6.  RTC in 2 weeks for MD assessment, labs (CBC with diff, CMP, Mg), and cycle #1 Taxol.     Honor Loh,  NP 01/30/2018, 9:02 AM

## 2018-02-01 DIAGNOSIS — K5900 Constipation, unspecified: Secondary | ICD-10-CM | POA: Insufficient documentation

## 2018-02-13 ENCOUNTER — Inpatient Hospital Stay (HOSPITAL_BASED_OUTPATIENT_CLINIC_OR_DEPARTMENT_OTHER): Payer: 59 | Admitting: Hematology and Oncology

## 2018-02-13 ENCOUNTER — Inpatient Hospital Stay: Payer: 59

## 2018-02-13 VITALS — BP 117/77 | HR 74 | Temp 98.0°F | Resp 18

## 2018-02-13 VITALS — BP 122/84 | HR 84 | Temp 97.5°F | Resp 18 | Wt 154.1 lb

## 2018-02-13 DIAGNOSIS — K1230 Oral mucositis (ulcerative), unspecified: Secondary | ICD-10-CM

## 2018-02-13 DIAGNOSIS — Z5111 Encounter for antineoplastic chemotherapy: Secondary | ICD-10-CM

## 2018-02-13 DIAGNOSIS — K8020 Calculus of gallbladder without cholecystitis without obstruction: Secondary | ICD-10-CM | POA: Diagnosis not present

## 2018-02-13 DIAGNOSIS — C50411 Malignant neoplasm of upper-outer quadrant of right female breast: Secondary | ICD-10-CM

## 2018-02-13 DIAGNOSIS — Z800 Family history of malignant neoplasm of digestive organs: Secondary | ICD-10-CM

## 2018-02-13 DIAGNOSIS — Z85828 Personal history of other malignant neoplasm of skin: Secondary | ICD-10-CM | POA: Diagnosis not present

## 2018-02-13 DIAGNOSIS — Z8614 Personal history of Methicillin resistant Staphylococcus aureus infection: Secondary | ICD-10-CM | POA: Diagnosis not present

## 2018-02-13 DIAGNOSIS — Z79899 Other long term (current) drug therapy: Secondary | ICD-10-CM

## 2018-02-13 DIAGNOSIS — Z7189 Other specified counseling: Secondary | ICD-10-CM

## 2018-02-13 DIAGNOSIS — K5900 Constipation, unspecified: Secondary | ICD-10-CM

## 2018-02-13 DIAGNOSIS — Z87891 Personal history of nicotine dependence: Secondary | ICD-10-CM | POA: Diagnosis not present

## 2018-02-13 DIAGNOSIS — Z801 Family history of malignant neoplasm of trachea, bronchus and lung: Secondary | ICD-10-CM

## 2018-02-13 DIAGNOSIS — G250 Essential tremor: Secondary | ICD-10-CM | POA: Diagnosis not present

## 2018-02-13 DIAGNOSIS — R599 Enlarged lymph nodes, unspecified: Secondary | ICD-10-CM

## 2018-02-13 DIAGNOSIS — Z170 Estrogen receptor positive status [ER+]: Secondary | ICD-10-CM

## 2018-02-13 DIAGNOSIS — D0512 Intraductal carcinoma in situ of left breast: Secondary | ICD-10-CM

## 2018-02-13 LAB — CBC WITH DIFFERENTIAL/PLATELET
Basophils Absolute: 0.1 10*3/uL (ref 0–0.1)
Basophils Relative: 1 %
Eosinophils Absolute: 0 10*3/uL (ref 0–0.7)
Eosinophils Relative: 0 %
HEMATOCRIT: 34 % — AB (ref 35.0–47.0)
HEMOGLOBIN: 12 g/dL (ref 12.0–16.0)
Lymphocytes Relative: 9 %
Lymphs Abs: 0.6 10*3/uL — ABNORMAL LOW (ref 1.0–3.6)
MCH: 30.4 pg (ref 26.0–34.0)
MCHC: 35.2 g/dL (ref 32.0–36.0)
MCV: 86.5 fL (ref 80.0–100.0)
MONO ABS: 0.7 10*3/uL (ref 0.2–0.9)
MONOS PCT: 10 %
NEUTROS ABS: 5.7 10*3/uL (ref 1.4–6.5)
NEUTROS PCT: 80 %
Platelets: 213 10*3/uL (ref 150–440)
RBC: 3.93 MIL/uL (ref 3.80–5.20)
RDW: 14.4 % (ref 11.5–14.5)
WBC: 7.2 10*3/uL (ref 3.6–11.0)

## 2018-02-13 LAB — COMPREHENSIVE METABOLIC PANEL
ALT: 25 U/L (ref 14–54)
AST: 25 U/L (ref 15–41)
Albumin: 3.7 g/dL (ref 3.5–5.0)
Alkaline Phosphatase: 94 U/L (ref 38–126)
Anion gap: 9 (ref 5–15)
BILIRUBIN TOTAL: 0.5 mg/dL (ref 0.3–1.2)
BUN: 9 mg/dL (ref 6–20)
CALCIUM: 9 mg/dL (ref 8.9–10.3)
CO2: 22 mmol/L (ref 22–32)
CREATININE: 0.73 mg/dL (ref 0.44–1.00)
Chloride: 105 mmol/L (ref 101–111)
GFR calc Af Amer: >60 mL/min (ref >60)
Glucose, Bld: 139 mg/dL — ABNORMAL HIGH (ref 65–99)
Potassium: 3.7 mmol/L (ref 3.5–5.1)
Sodium: 136 mmol/L (ref 135–145)
TOTAL PROTEIN: 6.4 g/dL — AB (ref 6.5–8.1)

## 2018-02-13 LAB — MAGNESIUM: Magnesium: 1.8 mg/dL (ref 1.7–2.4)

## 2018-02-13 MED ORDER — FAMOTIDINE IN NACL 20-0.9 MG/50ML-% IV SOLN: 20.0000 mg | Freq: Once | INTRAVENOUS | Status: DC

## 2018-02-13 MED ORDER — SODIUM CHLORIDE 0.9 % IV SOLN
20.0000 mg | Freq: Once | INTRAVENOUS | Status: AC
  Administered 2018-02-13: 20 mg via INTRAVENOUS
  Filled 2018-02-13: qty 2

## 2018-02-13 MED ORDER — SODIUM CHLORIDE 0.9 % IV SOLN
Freq: Once | INTRAVENOUS | Status: AC
  Administered 2018-02-13: 11:00:00 via INTRAVENOUS
  Filled 2018-02-13: qty 1000

## 2018-02-13 MED ORDER — HEPARIN SOD (PORK) LOCK FLUSH 100 UNIT/ML IV SOLN
500.0000 [IU] | Freq: Once | INTRAVENOUS | Status: AC | PRN
  Administered 2018-02-13: 500 [IU]
  Filled 2018-02-13: qty 5

## 2018-02-13 MED ORDER — SODIUM CHLORIDE 0.9 % IV SOLN
80.0000 mg/m2 | Freq: Once | INTRAVENOUS | Status: AC
  Administered 2018-02-13: 144 mg via INTRAVENOUS
  Filled 2018-02-13: qty 24

## 2018-02-13 MED ORDER — SODIUM CHLORIDE 0.9 % IV SOLN
INTRAVENOUS | Status: DC
  Administered 2018-02-13: 11:00:00 via INTRAVENOUS
  Filled 2018-02-13 (×2): qty 100

## 2018-02-13 MED ORDER — DIPHENHYDRAMINE HCL 50 MG/ML IJ SOLN
50.0000 mg | Freq: Once | INTRAMUSCULAR | Status: AC
  Administered 2018-02-13: 50 mg via INTRAVENOUS
  Filled 2018-02-13: qty 1

## 2018-02-13 NOTE — Progress Notes (Signed)
Pt in for follow up and first Taxol treatment today. Pt reports having painful oral mucosa, denies open sores but has affected her eating greatly this past week.

## 2018-02-13 NOTE — Progress Notes (Signed)
[facility] day:  02/13/2018   Chief Complaint: Megan Vega is a 62 y.o. female with stage IIIB right breast cancer and DCIS of the left breast who is seen for assessment prior to week #1 Taxol.  HPI: The patient was last seen in the medical oncology clinic on 01/30/2018 by Honor Loh, NP.  At that time, she was doing well overall. She had some constipation that has improved with the use of Miralax. She had experienced a single episode of Neulasta induced bone pain following her last injection. She had a "tingling" sensation in her hands. Neurologic exam revealed slight tremor in left upper extremity. Exam was otherwise stable.  WBC was  6,600 with an [place] of 4,800.  Hemoglobin was 12.4, hematocrit 35.2, and platelets 171,000.  She received cycle #4 AC with Neulasta support.  During the interim, patient has been doing very well with her treatments. She denies nausea, vomiting, and pain. Patient verbalizes no acute concerns with her breasts. She has developed some early mucositis symptoms after her last cycle. She has had to eat mainly soft foods due to the soreness. Patient is doing salt water and Orajel mouth rinses. She has not experienced any B symptoms or interval infections. Patient continues to note slight transient tingling in her hands.  Patient is eating well. Her weight remains stable. Patient denies pain in the clinic today.    Past Medical History:  Diagnosis Date  . Cancer ([place])    Basal Cell Carcinoma, about 2011  . Cataract   . Family history of adverse reaction to anesthesia    DAUGHTER-N/V  . Hemorrhoids   . History of methicillin resistant staphylococcus aureus (MRSA) 2015  . Migraines    MIGRAINES    Past Surgical History:  Procedure Laterality Date  . BREAST BIOPSY Bilateral 10/29/2017   L breast DCIS, R breast invasive mammary carcinoma of no special type, ER/PR+, Her2/neu 1+  . CESAREAN SECTION    . COLONOSCOPY  2012   . MANDIBLE FRACTURE SURGERY    . PORTACATH PLACEMENT Left 11/28/2017   Procedure: INSERTION PORT-A-CATH;  Surgeon: Robert Bellow, MD;  Location: [facility] ORS;  Service: General;  Laterality: Left;    Family History  Problem Relation Age of Onset  . Cancer Mother        Esophageal Cancer  . Early death Mother        Age 55  . Cancer Father        Lung Cancer  . Diabetes Father   . Diabetes Brother        Controlled with diet  . Heart attack Paternal Grandfather     Social History:  reports that she quit smoking about 41 years ago. She has a 2.50 pack-year smoking history. She has never used smokeless tobacco. She reports that she drinks about 0.6 oz of alcohol per week. She reports that she does not use drugs.  Patient is a former smoker for 20 years; only smoked "about 3 cigarettes a day"; stopped 30 years ago. Patient drinks alcohol very infrequently; "about 1 glass a month". Patient currently employed as an Web designer at [employer] She denies any known exposures to radiations or toxins.  She lives in [place].  Her grand daughter is graduating in 03/2018.  She has one daughter Megan Vega).  She is accompanied by her daughter today.  Allergies:  Allergies  Allergen Reactions  . Peanut-Containing Drug Products Swelling and Other (See Comments)  Only when eating too many peanuts, swelling with lips and tingly face    Current Medications: Current Outpatient Medications  Medication Sig Dispense Refill  . acetaminophen (TYLENOL) 500 MG tablet Take 1,000 mg by mouth every 6 (six) hours as needed for moderate pain or headache.    . b complex vitamins tablet Take 1 tablet by mouth daily.    Marland Kitchen dexamethasone (DECADRON) 4 MG tablet Take 2 tablets by mouth once a day on the day after chemotherapy and then take 2 tablets two times a day for 2 days. Take with food. 30 tablet 1  . lidocaine-prilocaine (EMLA) cream Apply to affected area once 30 g 3  . loratadine  (CLARITIN) 10 MG tablet Take 10 mg by mouth daily.    Marland Kitchen LORazepam (ATIVAN) 0.5 MG tablet Take 1 tablet (0.5 mg total) by mouth every 6 (six) hours as needed (Nausea or vomiting). 30 tablet 0  . Multiple Vitamins-Minerals (HAIR/SKIN/NAILS/BIOTIN PO) Take 1 tablet by mouth daily.     . ondansetron (ZOFRAN) 8 MG tablet Take 1 tablet (8 mg total) by mouth 2 (two) times daily as needed. Start on the third day after chemotherapy. 30 tablet 1  . polyethylene glycol (MIRALAX / GLYCOLAX) packet Take 17 g by mouth daily as needed for mild constipation.     No current facility-administered medications for this visit.     Review of Systems:  GENERAL:  Feels "good".  No fevers, chills or sweats. Weight stable.  PERFORMANCE STATUS (ECOG):  1 HEENT:  Mucositis; improved with salt water rinses. No visual changes, runny nose, sore throat. Lungs: No shortness of breath or cough.  No hemoptysis. Cardiac:  No chest pain, palpitations, orthopnea, or PND. GI:  Mild constipation; improved with Miralax. No nausea, vomiting, diarrhea, melena or hematochezia.  GU:  No urgency, frequency, dysuria, or hematuria. Musculoskeletal:  No back pain.  No joint pain.  No muscle tenderness. Extremities:  No pain or swelling. Skin:  No rashes or skin changes. Neuro:  h/o migraine headaches.  Essential tremors.  "Tingling" in hands; stable. No numbness or weakness, balance or coordination issues. Endocrine:  No diabetes, thyroid issues, hot flashes or night sweats. Psych:  No mood changes, depression or anxiety. Pain:  No focal pain.  Review of systems:  All other systems reviewed and found to be negative.  Physical Exam: Blood pressure 122/84, pulse 84, temperature (!) 97.5 F (36.4 C), temperature source Tympanic, resp. rate 18, weight 154 lb 2 oz (69.9 kg). GENERAL:  Well developed, well nourished, woman sitting comfortably in the exam room in no acute distress. MENTAL STATUS:  Alert and oriented to person, place and  time. HEAD:  Short strawberry blonde wig.  Normocephalic, atraumatic, face symmetric, no Cushingoid features. EYES:  Dark green eyes.  Pupils equal round and reactive to light and accomodation.  No conjunctivitis or scleral icterus. ENT:  Oropharynx clear without lesion.  Tongue normal. Mucous membranes moist.  RESPIRATORY:  Clear to auscultation without rales, wheezes or rhonchi. CARDIOVASCULAR:  Regular rate and rhythm without murmur, rub or gallop. CHEST:  Left sided port-a-cath accessed.  No surrounding erythema or induration. ABDOMEN:  Soft, non-tender, with active bowel sounds, and no hepatosplenomegaly.  No masses. SKIN:  No rashes, ulcers or lesions. EXTREMITIES: No edema, no skin discoloration or tenderness.  No palpable cords. LYMPH NODES:  No palpable cervical, supraclavicular, axillary or inguinal adenopathy  NEUROLOGICAL: Approprite. PSYCH:  Appropriate.   Results for orders placed or performed in visit on  02/13/18 (from the past 24 hour(s))  Magnesium     Status: None   Collection Time: 02/13/18  9:15 AM  Result Value Ref Range   Magnesium 1.8 1.7 - 2.4 mg/dL  Comprehensive metabolic panel     Status: Abnormal   Collection Time: 02/13/18  9:15 AM  Result Value Ref Range   Sodium 136 135 - 145 mmol/L   Potassium 3.7 3.5 - 5.1 mmol/L   Chloride 105 101 - 111 mmol/L   CO2 22 22 - 32 mmol/L   Glucose, Bld 139 (H) 65 - 99 mg/dL   BUN 9 6 - 20 mg/dL   Creatinine, Ser 0.73 0.44 - 1.00 mg/dL   Calcium 9.0 8.9 - 10.3 mg/dL   Total Protein 6.4 (L) 6.5 - 8.1 g/dL   Albumin 3.7 3.5 - 5.0 g/dL   AST 25 15 - 41 U/L   ALT 25 14 - 54 U/L   Alkaline Phosphatase 94 38 - 126 U/L   Total Bilirubin 0.5 0.3 - 1.2 mg/dL   GFR calc non Af Amer >60 >60 mL/min   GFR calc Af Amer >60 >60 mL/min   Anion gap 9 5 - 15  CBC with Differential     Status: Abnormal   Collection Time: 02/13/18  9:15 AM  Result Value Ref Range   WBC 7.2 3.6 - 11.0 K/uL   RBC 3.93 3.80 - 5.20 MIL/uL    Hemoglobin 12.0 12.0 - 16.0 g/dL   HCT 34.0 (L) 35.0 - 47.0 %   MCV 86.5 80.0 - 100.0 fL   MCH 30.4 26.0 - 34.0 pg   MCHC 35.2 32.0 - 36.0 g/dL   RDW 14.4 11.5 - 14.5 %   Platelets 213 150 - 440 K/uL   Neutrophils Relative % 80 %   Neutro Abs 5.7 1.4 - 6.5 K/uL   Lymphocytes Relative 9 %   Lymphs Abs 0.6 (L) 1.0 - 3.6 K/uL   Monocytes Relative 10 %   Monocytes Absolute 0.7 0.2 - 0.9 K/uL   Eosinophils Relative 0 %   Eosinophils Absolute 0.0 0 - 0.7 K/uL   Basophils Relative 1 %   Basophils Absolute 0.1 0 - 0.1 K/uL     Assessment:  Mersedes Alber is a 62 y.o. female with clinical stage [id number] right breast cancer and DCIS in the left breast s/p ultrasound guided biopsy on 10/29/2017. Pathology from the right breast at the 10 o'clock position 3 cm from the nipple revealed a 1.4 cm grade II invasive mammary carcinoma of no special type.  Tumor was ER + (> 90%), PR + (90%) and Her2/neu 1+.  Right axillary node biopsy revealed metastatic carcinoma.  Left breast biopsy at the 2 o'clock position 3 cm from the nipple revealed high grade DCIS with comedonecrosis.  CA27.29 was 23.5 on 11/14/2017.  Bilateral diagnostic mammogram and ultrasound on 10/23/2017 revealed a 3.9 x 1.7 x 2.2 cm irregular hypoechoic mass with associated areas of vascularity at the 10 o'clock position 3 cm from the nipple and a 1.1 x 0.5 x 0.4 cm area of nodularity (3 approximately 3 mm nodules at 10 o'clock 6 cm from the nipple in the right breast. The mass was within a 5 cm area of pleomorphic microcalcifications.  Ultrasound revealed at least 5 prominent lymph nodes with diffuse thickened and hypoechoic cortices suggestive of metastatic disease.  In the left breast, there was an irregular 1.6 x 1.3 x 1.2 cm hypoechoic mass with indistinct margins at 2 o'clock  3 cm from the nipple.  Axillary ultrasound revealed normal nodes.  Bilateral breast MRI on 11/17/2017 revealed a 4.3 x 4.8 x 5 cm mass on the right centered in the  upper outer quadrant but also extends into the lower outer and upper inner quadrants.  There was a small amount of enhancement posterior to the right nipple which could represent subtle extension. There was a cluster of nodules located posterior to the superior aspect of the known malignancy and another small nodule located posterior to the inferior aspect of the malignancy which could represent satellite lesions. Taking the enhancement posterior to the right nipple and the potential posterior satellite lesions into account, the AP dimension could be as large as 7 cm.  The patient's known DCIS spans 5 x 5 x 4.4 cm mammographically based on calcifications. The associated enhancement spans 3.2 x 3.2 x 3.3 cm. However, there were 2 small linearly enhancing regions at 6 o'clock in the left breast which could represent further disease.  There was extensive adenopathy in the right axilla and right retropectoral region. There was at least 1 significantly enlarged right internal mammary lymph node.  PET scan on 11/24/2017 revealed hypermetabolic right axillary (2.6 x 3.1 cm; SUV 8.0), subpectoral/internal mammary (7 mm; SUV 2.4) adenopathy.  There was a 4 mm right lower lobe nodule is too small for PET resolution.  There was cholelithiasis.  She received 4 cycles of AC (12/05/2017 - 01/30/2018) with OnPro Neulasta support.  Bone density on 12/16/2016 revealed osteopenia with a T-score of -1.6 in the left femoral neck on 12/16/2017.  Symptomatically, she is doing well overall. Patient has resolving mucositis. She continues to have a "tingling" sensation in her hands. Exam stable.  WBC 7200 with an [place] of 5700.  Hemoglobin 12.0, hematocrit 34.0, and platelets 213,000.  Plan: 1.  Labs today:  CBC with diff, CMP, Mg. 2.  Discuss follow-up breast ultrasound with Dr. Bary Castilla. 3.  Labs reviewed. Blood counts stable and adequate enough for treatment. Proceed with week #1 Taxol today as planned.  4.  Review neutropenic  precautions given history of low counts with each cycle. 5.  Discuss symptom management.  Patient has antiemetics and pain medications at home to use.  She notes that the  prescribed interventions are adequate at this point. 6.  Discuss follow up with Dr. Bary Castilla as scheduled on 02/18/2018 for ultrasound.   7.  RTC in 1 week for MD assessment, labs (CBC with diff, CMP, Mg), and week #2 Taxol.     Honor Loh, NP 02/13/2018, 10:15 AM  I saw and evaluated the patient, participating in the key portions of the service and reviewing pertinent diagnostic studies and records.  I reviewed the nurse practitioner's note and agree with the findings and the plan.  Several questions were asked by the patient and answered.   Nolon Stalls, MD 02/13/2018, 10:15 AM

## 2018-02-18 ENCOUNTER — Encounter: Payer: Self-pay | Admitting: General Surgery

## 2018-02-18 ENCOUNTER — Ambulatory Visit: Payer: Self-pay

## 2018-02-18 ENCOUNTER — Ambulatory Visit (INDEPENDENT_AMBULATORY_CARE_PROVIDER_SITE_OTHER): Payer: 59 | Admitting: General Surgery

## 2018-02-18 VITALS — BP 128/82 | HR 84 | Resp 12 | Ht 65.0 in | Wt 156.0 lb

## 2018-02-18 DIAGNOSIS — C50411 Malignant neoplasm of upper-outer quadrant of right female breast: Secondary | ICD-10-CM | POA: Diagnosis not present

## 2018-02-18 DIAGNOSIS — D0512 Intraductal carcinoma in situ of left breast: Secondary | ICD-10-CM | POA: Diagnosis not present

## 2018-02-18 NOTE — Progress Notes (Signed)
Patient ID: Megan Vega, female   DOB: 31-Jan-1956, 62 y.o.   MRN: [id number]  Chief Complaint  [name]    HPI Megan Vega is a 62 y.o. female.  Here for follow up, post neoadjuvant chemotherapy for lright breast invasive cancer and left breast DCIS. Finished the first cycle chemotherapy 2 weeks ago. She states her energy level has improved. She is an Software engineer at [employer].  HPI  Past Medical History:  Diagnosis Date  . Cancer ([place])    Basal Cell Carcinoma, about 2011  . Cataract   . Family history of adverse reaction to anesthesia    DAUGHTER-N/V  . Hemorrhoids   . History of methicillin resistant staphylococcus aureus (MRSA) 2015  . Migraines    MIGRAINES    Past Surgical History:  Procedure Laterality Date  . BREAST BIOPSY Bilateral 10/29/2017   L breast DCIS, R breast invasive mammary carcinoma of no special type, ER/PR+, Her2/neu 1+  . CESAREAN SECTION    . COLONOSCOPY  2012  . MANDIBLE FRACTURE SURGERY    . PORTACATH PLACEMENT Left 11/28/2017   Procedure: INSERTION PORT-A-CATH;  Surgeon: Robert Bellow, MD;  Location: [facility] ORS;  Service: General;  Laterality: Left;    Family History  Problem Relation Age of Onset  . Cancer Mother        Esophageal Cancer  . Early death Mother        Age 33  . Cancer Father        Lung Cancer  . Diabetes Father   . Diabetes Brother        Controlled with diet  . Heart attack Paternal Grandfather     Social History Social History   Tobacco Use  . Smoking status: Former Smoker    Packs/day: 0.25    Years: 10.00    Pack years: 2.50    Last attempt to quit: 1978    Years since quitting: 41.2  . Smokeless tobacco: Never Used  Substance Use Topics  . Alcohol use: Yes    Alcohol/week: 0.6 oz    Types: 1 Glasses of wine per week    Comment: RARE  . Drug use: No    Allergies  Allergen Reactions  . Peanut-Containing Drug Products Swelling and Other (See Comments)    Only  when eating too many peanuts, swelling with lips and tingly face    Current Outpatient Medications  Medication Sig Dispense Refill  . acetaminophen (TYLENOL) 500 MG tablet Take 1,000 mg by mouth every 6 (six) hours as needed for moderate pain or headache.    . b complex vitamins tablet Take 1 tablet by mouth daily.    Marland Kitchen dexamethasone (DECADRON) 4 MG tablet Take 2 tablets by mouth once a day on the day after chemotherapy and then take 2 tablets two times a day for 2 days. Take with food. 30 tablet 1  . lidocaine-prilocaine (EMLA) cream Apply to affected area once 30 g 3  . loratadine (CLARITIN) 10 MG tablet Take 10 mg by mouth daily.    Marland Kitchen LORazepam (ATIVAN) 0.5 MG tablet Take 1 tablet (0.5 mg total) by mouth every 6 (six) hours as needed (Nausea or vomiting). 30 tablet 0  . Multiple Vitamins-Minerals (HAIR/SKIN/NAILS/BIOTIN PO) Take 1 tablet by mouth daily.     . ondansetron (ZOFRAN) 8 MG tablet Take 1 tablet (8 mg total) by mouth 2 (two) times daily as needed. Start on the third day after chemotherapy. 30 tablet 1  .  polyethylene glycol (MIRALAX / GLYCOLAX) packet Take 17 g by mouth daily as needed for mild constipation.     No current facility-administered medications for this visit.     Review of Systems Review of Systems  Constitutional: Negative.   Respiratory: Negative.   Cardiovascular: Negative.     Blood pressure 128/82, pulse 84, resp. rate 12, height _0  (1.651 m), weight 156 lb (70.8 kg), SpO2 97 %.  Physical Exam Physical Exam  Constitutional: She is oriented to person, place, and time. She appears well-developed and well-nourished.  HENT:  Mouth/Throat: Oropharynx is clear and moist.  Eyes: Conjunctivae are normal. No scleral icterus.  Neck: Neck supple.  Cardiovascular: Normal rate, regular rhythm and normal heart sounds.  Pulmonary/Chest: Effort normal and breath sounds normal. Right breast exhibits no inverted nipple, no mass, no nipple discharge, no skin change  and no tenderness. Left breast exhibits no inverted nipple, no mass, no nipple discharge, no skin change and no tenderness.    Lymphadenopathy:    She has no cervical adenopathy.    She has no axillary adenopathy.       Right: No supraclavicular adenopathy present.  Measurements were obtained at a location 15 cm above as well as 10 and 20 cm below the olecranon process.  Right: 31, 24, 18.5 cm.   December 2018 measurements: 33, 26, 19.  Left: 31, 24, 17 cm.   December 2018: 32, 26, 18 cm.  Neurological: She is alert and oriented to person, place, and time.  Skin: Skin is warm and dry.  Psychiatric: Her behavior is normal.    Data Reviewed Ultrasound examination of the breast was undertaken to determine response to doxorubicin chemotherapy recently completed.  The dominant mass in the upper outer quadrant of the right breast shows marked decrease in size.  This measures now approximately 1.0 x 1.36 x 2.14 cm.  Axillary lymph nodes show marked improvement with only 2 of  5 previous identified  lymph nodes identifiable.  The largest measures just over 1.3 cm.  The smaller shows a marked improvement in cortical thickness from 0.99 decreased 0.39.  BI-RADS-6  At the time of her December 2018 exam prior to neoadjuvant chemotherapy based by showed a mass measuring up to 3.9 cm in diameter.  Today's exam would suggest a greater than 50% decrease in volume of the right upper outer quadrant lesion.  Ultrasound exam of the left breast in the area of high-grade DCIS was not repeated today.  Assessment    Significant improvement in the invasive cancer involving the right breast.    Plan    The patient had a pretreatment MRI showing multi-quadrant disease.  She has had an excellent response to her first course of chemotherapy, and expect continued improvement.  Based on ultrasound appearance, she is now a likely candidate for breast conservation if she so chooses.  We discussed the pros and  cons of posttreatment MRI prior to surgery, with the potential for possible overstatement of residual disease.  Opportunity to meet with plastic surgery to discuss options was reviewed.  Informational brochure on options for treatment including lumpectomy with axillary dissection and radiation, mastectomy with and without reconstruction reviewed.   She will call if she would like to talk with plastics     HPI, Physical Exam, Assessment and Plan have been scribed under the direction and in the presence of Robert Bellow, MD. Megan Fetch, RN  I have completed the exam and reviewed the above documentation for  accuracy and completeness.  I agree with the above.  Haematologist has been used and any errors in dictation or transcription are unintentional.  Hervey Ard, M.D., F.A.C.S.  Megan Vega 02/18/2018, 5:27 PM

## 2018-02-18 NOTE — Patient Instructions (Signed)
The patient is aware to call back for any questions or concerns.  

## 2018-02-20 ENCOUNTER — Inpatient Hospital Stay (HOSPITAL_BASED_OUTPATIENT_CLINIC_OR_DEPARTMENT_OTHER): Payer: 59 | Admitting: Hematology and Oncology

## 2018-02-20 ENCOUNTER — Encounter: Payer: Self-pay | Admitting: *Deleted

## 2018-02-20 ENCOUNTER — Inpatient Hospital Stay: Payer: 59

## 2018-02-20 ENCOUNTER — Inpatient Hospital Stay: Payer: 59 | Attending: Hematology and Oncology

## 2018-02-20 ENCOUNTER — Encounter: Payer: Self-pay | Admitting: Hematology and Oncology

## 2018-02-20 VITALS — BP 122/83 | HR 83 | Temp 97.3°F | Resp 18 | Wt 156.1 lb

## 2018-02-20 DIAGNOSIS — Z800 Family history of malignant neoplasm of digestive organs: Secondary | ICD-10-CM | POA: Diagnosis not present

## 2018-02-20 DIAGNOSIS — E876 Hypokalemia: Secondary | ICD-10-CM | POA: Diagnosis not present

## 2018-02-20 DIAGNOSIS — R222 Localized swelling, mass and lump, trunk: Secondary | ICD-10-CM | POA: Diagnosis not present

## 2018-02-20 DIAGNOSIS — Z5111 Encounter for antineoplastic chemotherapy: Secondary | ICD-10-CM

## 2018-02-20 DIAGNOSIS — Z87891 Personal history of nicotine dependence: Secondary | ICD-10-CM | POA: Insufficient documentation

## 2018-02-20 DIAGNOSIS — C773 Secondary and unspecified malignant neoplasm of axilla and upper limb lymph nodes: Secondary | ICD-10-CM | POA: Diagnosis not present

## 2018-02-20 DIAGNOSIS — Z801 Family history of malignant neoplasm of trachea, bronchus and lung: Secondary | ICD-10-CM

## 2018-02-20 DIAGNOSIS — D0512 Intraductal carcinoma in situ of left breast: Secondary | ICD-10-CM | POA: Diagnosis not present

## 2018-02-20 DIAGNOSIS — Z8614 Personal history of Methicillin resistant Staphylococcus aureus infection: Secondary | ICD-10-CM

## 2018-02-20 DIAGNOSIS — Z170 Estrogen receptor positive status [ER+]: Secondary | ICD-10-CM

## 2018-02-20 DIAGNOSIS — C50411 Malignant neoplasm of upper-outer quadrant of right female breast: Secondary | ICD-10-CM

## 2018-02-20 DIAGNOSIS — J029 Acute pharyngitis, unspecified: Secondary | ICD-10-CM | POA: Diagnosis not present

## 2018-02-20 DIAGNOSIS — Z85828 Personal history of other malignant neoplasm of skin: Secondary | ICD-10-CM | POA: Diagnosis not present

## 2018-02-20 DIAGNOSIS — R509 Fever, unspecified: Secondary | ICD-10-CM | POA: Insufficient documentation

## 2018-02-20 DIAGNOSIS — K8020 Calculus of gallbladder without cholecystitis without obstruction: Secondary | ICD-10-CM | POA: Insufficient documentation

## 2018-02-20 LAB — COMPREHENSIVE METABOLIC PANEL
ALT: 22 U/L (ref 14–54)
AST: 27 U/L (ref 15–41)
Albumin: 3.6 g/dL (ref 3.5–5.0)
Alkaline Phosphatase: 75 U/L (ref 38–126)
Anion gap: 7 (ref 5–15)
BUN: 14 mg/dL (ref 6–20)
CO2: 24 mmol/L (ref 22–32)
Calcium: 8.5 mg/dL — ABNORMAL LOW (ref 8.9–10.3)
Chloride: 106 mmol/L (ref 101–111)
Creatinine, Ser: 0.73 mg/dL (ref 0.44–1.00)
GFR calc Af Amer: >60 mL/min (ref >60)
GFR calc non Af Amer: >60 mL/min (ref >60)
Glucose, Bld: 157 mg/dL — ABNORMAL HIGH (ref 65–99)
Potassium: 3.6 mmol/L (ref 3.5–5.1)
Sodium: 137 mmol/L (ref 135–145)
Total Bilirubin: 0.5 mg/dL (ref 0.3–1.2)
Total Protein: 6.1 g/dL — ABNORMAL LOW (ref 6.5–8.1)

## 2018-02-20 LAB — CBC WITH DIFFERENTIAL/PLATELET
Basophils Absolute: 0.1 10*3/uL (ref 0–0.1)
Basophils Relative: 1 %
Eosinophils Absolute: 0 10*3/uL (ref 0–0.7)
Eosinophils Relative: 1 %
HCT: 33 % — ABNORMAL LOW (ref 35.0–47.0)
Hemoglobin: 11.9 g/dL — ABNORMAL LOW (ref 12.0–16.0)
Lymphocytes Relative: 9 %
Lymphs Abs: 0.7 10*3/uL — ABNORMAL LOW (ref 1.0–3.6)
MCH: 31.3 pg (ref 26.0–34.0)
MCHC: 36.2 g/dL — ABNORMAL HIGH (ref 32.0–36.0)
MCV: 86.4 fL (ref 80.0–100.0)
Monocytes Absolute: 0.8 10*3/uL (ref 0.2–0.9)
Monocytes Relative: 10 %
Neutro Abs: 6.1 10*3/uL (ref 1.4–6.5)
Neutrophils Relative %: 79 %
Platelets: 319 10*3/uL (ref 150–440)
RBC: 3.82 MIL/uL (ref 3.80–5.20)
RDW: 14.4 % (ref 11.5–14.5)
WBC: 7.7 10*3/uL (ref 3.6–11.0)

## 2018-02-20 LAB — MAGNESIUM: Magnesium: 1.9 mg/dL (ref 1.7–2.4)

## 2018-02-20 MED ORDER — HEPARIN SOD (PORK) LOCK FLUSH 100 UNIT/ML IV SOLN: 500.0000 [IU] | Freq: Once | INTRAVENOUS | Status: DC | PRN

## 2018-02-20 MED ORDER — SODIUM CHLORIDE 0.9 % IV SOLN
20.0000 mg | Freq: Once | INTRAVENOUS | Status: AC
  Administered 2018-02-20: 20 mg via INTRAVENOUS
  Filled 2018-02-20: qty 2

## 2018-02-20 MED ORDER — FAMOTIDINE IN NACL 20-0.9 MG/50ML-% IV SOLN: 20.0000 mg | Freq: Once | INTRAVENOUS | Status: DC

## 2018-02-20 MED ORDER — DIPHENHYDRAMINE HCL 50 MG/ML IJ SOLN
50.0000 mg | Freq: Once | INTRAMUSCULAR | Status: AC
  Administered 2018-02-20: 50 mg via INTRAVENOUS
  Filled 2018-02-20: qty 1

## 2018-02-20 MED ORDER — SODIUM CHLORIDE 0.9% FLUSH
10.0000 mL | INTRAVENOUS | Status: DC | PRN
  Filled 2018-02-20: qty 10

## 2018-02-20 MED ORDER — SODIUM CHLORIDE 0.9% FLUSH
10.0000 mL | INTRAVENOUS | Status: DC | PRN
  Administered 2018-02-20: 10 mL via INTRAVENOUS
  Filled 2018-02-20: qty 10

## 2018-02-20 MED ORDER — SODIUM CHLORIDE 0.9 % IV SOLN
Freq: Once | INTRAVENOUS | Status: AC
  Administered 2018-02-20: 10:00:00 via INTRAVENOUS
  Filled 2018-02-20: qty 100

## 2018-02-20 MED ORDER — SODIUM CHLORIDE 0.9 % IV SOLN
Freq: Once | INTRAVENOUS | Status: AC
  Administered 2018-02-20: 10:00:00 via INTRAVENOUS
  Filled 2018-02-20: qty 1000

## 2018-02-20 MED ORDER — HEPARIN SOD (PORK) LOCK FLUSH 100 UNIT/ML IV SOLN
500.0000 [IU] | Freq: Once | INTRAVENOUS | Status: AC
  Administered 2018-02-20: 500 [IU] via INTRAVENOUS
  Filled 2018-02-20: qty 5

## 2018-02-20 MED ORDER — SODIUM CHLORIDE 0.9 % IV SOLN
80.0000 mg/m2 | Freq: Once | INTRAVENOUS | Status: AC
  Administered 2018-02-20: 144 mg via INTRAVENOUS
  Filled 2018-02-20: qty 24

## 2018-02-20 NOTE — Progress Notes (Signed)
Patient offers no complaints today.  States so far Taxol has been much easier on her than Adria.  She had lots of energy after her infusion and had no nausea.

## 2018-02-20 NOTE — Progress Notes (Signed)
[facility] day:  02/20/2018  Chief Complaint: Megan Vega is a 62 y.o. female with stage IIIB right breast cancer and DCIS of the left breast who is seen for assessment prior to week #2 Taxol.  HPI: The patient was last seen in the medical oncology clinic on 02/13/2018.  At that time, she was doing well overall. Patient had resolving mucositis. She had a "tingling" sensation in her hands. Exam was stable.  WBC was 7200 with an [place] of 5700.  Hemoglobin was 12.0, hematocrit 34.0, and platelets 213,000.  She received week #1 Taxol.  She was seen by Dr. Bary Vega for follow-up breast ultrasound on 02/18/2018.  The dominant mass in the upper outer quadrant of the right breast shows marked decrease in size (1.0 x 1.36 x 2.14 cm).  Axillary lymph nodes showed marked improvement with only 2 of  5 previous identified  lymph nodes identifiable.  The largest measures node was just over 1.3 cm.  The smaller node showed a marked improvement in cortical thickness from 0.99 decreased 0.39.  During the interim, she describes "so much energy".  She has been amazed by how good she has felt.  She denies any neuropathy.   Past Medical History:  Diagnosis Date  . Cancer ([place])    Basal Cell Carcinoma, about 2011  . Cataract   . Family history of adverse reaction to anesthesia    DAUGHTER-N/V  . Hemorrhoids   . History of methicillin resistant staphylococcus aureus (MRSA) 2015  . Migraines    MIGRAINES    Past Surgical History:  Procedure Laterality Date  . BREAST BIOPSY Bilateral 10/29/2017   L breast DCIS, R breast invasive mammary carcinoma of no special type, ER/PR+, Her2/neu 1+  . CESAREAN SECTION    . COLONOSCOPY  2012  . MANDIBLE FRACTURE SURGERY    . PORTACATH PLACEMENT Left 11/28/2017   Procedure: INSERTION PORT-A-CATH;  Surgeon: Megan Bellow, MD;  Location: [facility] ORS;  Service: General;  Laterality: Left;    Family History  Problem Relation  Age of Onset  . Cancer Mother        Esophageal Cancer  . Early death Mother        Age 84  . Cancer Father        Lung Cancer  . Diabetes Father   . Diabetes Brother        Controlled with diet  . Heart attack Paternal Grandfather     Social History:  reports that she quit smoking about 41 years ago. She has a 2.50 pack-year smoking history. She has never used smokeless tobacco. She reports that she drinks about 0.6 oz of alcohol per week. She reports that she does not use drugs.  Patient is a former smoker for 20 years; only smoked "about 3 cigarettes a day"; stopped 30 years ago. Patient drinks alcohol very infrequently; "about 1 glass a month". Patient currently employed as an Web designer at [employer] She denies any known exposures to radiations or toxins.  She lives in [place].  Her grand daughter is graduating in 03/2018.  She has one daughter Megan Vega).  She is alone today.  Allergies:  Allergies  Allergen Reactions  . Peanut-Containing Drug Products Swelling and Other (See Comments)    Only when eating too many peanuts, swelling with lips and tingly face    Current Medications: Current Outpatient Medications  Medication Sig Dispense Refill  . acetaminophen (TYLENOL) 500 MG tablet  Take 1,000 mg by mouth every 6 (six) hours as needed for moderate pain or headache.    . b complex vitamins tablet Take 1 tablet by mouth daily.    Marland Kitchen dexamethasone (DECADRON) 4 MG tablet Take 2 tablets by mouth once a day on the day after chemotherapy and then take 2 tablets two times a day for 2 days. Take with food. 30 tablet 1  . lidocaine-prilocaine (EMLA) cream Apply to affected area once 30 g 3  . loratadine (CLARITIN) 10 MG tablet Take 10 mg by mouth daily.    Marland Kitchen LORazepam (ATIVAN) 0.5 MG tablet Take 1 tablet (0.5 mg total) by mouth every 6 (six) hours as needed (Nausea or vomiting). 30 tablet 0  . Multiple Vitamins-Minerals (HAIR/SKIN/NAILS/BIOTIN PO) Take 1 tablet by  mouth daily.     . ondansetron (ZOFRAN) 8 MG tablet Take 1 tablet (8 mg total) by mouth 2 (two) times daily as needed. Start on the third day after chemotherapy. 30 tablet 1  . polyethylene glycol (MIRALAX / GLYCOLAX) packet Take 17 g by mouth daily as needed for mild constipation.     No current facility-administered medications for this visit.     Review of Systems:  GENERAL:  Lots of energy.  No fevers, chills or sweats. Weight stable.  PERFORMANCE STATUS (ECOG):  1 HEENT:  Mucositis, resolved. No visual changes, runny nose, sore throat. Lungs: No shortness of breath or cough.  No hemoptysis. Cardiac:  No chest pain, palpitations, orthopnea, or PND. GI:  Mild constipation; improved with Miralax. No nausea, vomiting, diarrhea, melena or hematochezia.  GU:  No urgency, frequency, dysuria, or hematuria. Musculoskeletal:  No back pain.  No joint pain.  No muscle tenderness. Extremities:  No pain or swelling. Skin:  No rashes or skin changes. Neuro:  h/o migraine headaches.  Essential tremors.  "Tingling" in hands; stable. No numbness or weakness, balance or coordination issues. Endocrine:  No diabetes, thyroid issues, hot flashes or night sweats. Psych:  No mood changes, depression or anxiety. Pain:  No focal pain.  Review of systems:  All other systems reviewed and found to be negative.  Physical Exam: Blood pressure 122/83, pulse 83, temperature (!) 97.3 F (36.3 C), temperature source Tympanic, resp. rate 18, weight 156 lb 1 oz (70.8 kg). GENERAL:  Well developed, well nourished, woman sitting comfortably in the exam room in no acute distress. MENTAL STATUS:  Alert and oriented to person, place and time. HEAD:  Short blonde wig.  Normocephalic, atraumatic, face symmetric, no Cushingoid features. EYES:  Dark green eyes.  Pupils equal round and reactive to light and accomodation.  No conjunctivitis or scleral icterus. ENT:  Oropharynx clear without lesion.  Tongue normal. Mucous  membranes moist.  RESPIRATORY:  Clear to auscultation without rales, wheezes or rhonchi. CARDIOVASCULAR:  Regular rate and rhythm without murmur, rub or gallop. CHEST:  Left sided port-a-cath accessed.  ABDOMEN:  Soft, non-tender, with active bowel sounds, and no hepatosplenomegaly.  No masses. SKIN:  No rashes, ulcers or lesions. EXTREMITIES: No edema, no skin discoloration or tenderness.  No palpable cords. LYMPH NODES:  No palpable cervical, supraclavicular, axillary or inguinal adenopathy  NEUROLOGICAL: Approprite. PSYCH:  Appropriate.   No results found for this or any previous visit (from the past 24 hour(s)).   Assessment:  Megan Vega is a 62 y.o. female with clinical stage [id number] right breast cancer and DCIS in the left breast s/p ultrasound guided biopsy on 10/29/2017. Pathology from the right breast at  the 10 o'clock position 3 cm from the nipple revealed a 1.4 cm grade II invasive mammary carcinoma of no special type.  Tumor was ER + (> 90%), PR + (90%) and Her2/neu 1+.  Right axillary node biopsy revealed metastatic carcinoma.  Left breast biopsy at the 2 o'clock position 3 cm from the nipple revealed high grade DCIS with comedonecrosis.  CA27.29 was 23.5 on 11/14/2017.  Bilateral diagnostic mammogram and ultrasound on 10/23/2017 revealed a 3.9 x 1.7 x 2.2 cm irregular hypoechoic mass with associated areas of vascularity at the 10 o'clock position 3 cm from the nipple and a 1.1 x 0.5 x 0.4 cm area of nodularity (3 approximately 3 mm nodules at 10 o'clock 6 cm from the nipple in the right breast. The mass was within a 5 cm area of pleomorphic microcalcifications.  Ultrasound revealed at least 5 prominent lymph nodes with diffuse thickened and hypoechoic cortices suggestive of metastatic disease.  In the left breast, there was an irregular 1.6 x 1.3 x 1.2 cm hypoechoic mass with indistinct margins at 2 o'clock 3 cm from the nipple.  Axillary ultrasound revealed normal  nodes.  Bilateral breast MRI on 11/17/2017 revealed a 4.3 x 4.8 x 5 cm mass on the right centered in the upper outer quadrant but also extends into the lower outer and upper inner quadrants.  There was a small amount of enhancement posterior to the right nipple which could represent subtle extension. There was a cluster of nodules located posterior to the superior aspect of the known malignancy and another small nodule located posterior to the inferior aspect of the malignancy which could represent satellite lesions. Taking the enhancement posterior to the right nipple and the potential posterior satellite lesions into account, the AP dimension could be as large as 7 cm.  The patient's known DCIS spans 5 x 5 x 4.4 cm mammographically based on calcifications. The associated enhancement spans 3.2 x 3.2 x 3.3 cm. However, there were 2 small linearly enhancing regions at 6 o'clock in the left breast which could represent further disease.  There was extensive adenopathy in the right axilla and right retropectoral region. There was at least 1 significantly enlarged right internal mammary lymph node.  PET scan on 11/24/2017 revealed hypermetabolic right axillary (2.6 x 3.1 cm; SUV 8.0), subpectoral/internal mammary (7 mm; SUV 2.4) adenopathy.  There was a 4 mm right lower lobe nodule is too small for PET resolution.  There was cholelithiasis.  She received 4 cycles of AC (12/05/2017 - 01/30/2018) with OnPro Neulasta support.  Breast ultrasound on 02/18/2018 revealed a 1.0 x 1.36 x 2.14 cm dominant mass in the upper outer quadrant of the right breast (>50% volume decrease).  Axillary lymph nodes showed marked improvement with only 2 of  5 previous identified  lymph nodes identifiable (largest 1.3 cm; smaller node with cortical thickness 0.39 compared to 0.99).  Bone density on 12/16/2016 revealed osteopenia with a T-score of -1.6 in the left femoral neck on 12/16/2017.  Symptomatically, she feels great.  Exam  stable.  WBC is 7700 with an [place] of 6100.  Hemoglobin is 11.9, hematocrit 33.0, and platelets 319,000.  Plan: 1.  Labs today:  CBC with diff, CMP, Mg. 2.  Review interval ultrasound- improvement. 3.  Labs reviewed. Blood counts stable and adequate for treatment. Proceed with week #2 Taxol today as planned.  4.  RTC weekly x 2 for labs (CBC with diff, CMP) and Taxol. 5.  RTC in 3 weeks for MD  assessment, labs (CBC with diff, CMP, Mg), and week #5 Taxol.     Nolon Stalls, MD 02/20/2018, 4:35 PM

## 2018-02-20 NOTE — Progress Notes (Signed)
  Oncology Nurse Navigator Documentation  Navigator Location: [facility] (02/20/18 1100)   )Navigator Encounter Type: Clinic/MDC (02/20/18 1100)                     Patient Visit Type: MedOnc (02/20/18 1100) Treatment Phase: Active Tx (02/20/18 1100)                            Time Spent with Patient: 15 (02/20/18 1100)   Met patient during her chemotherapy treatment today.  She is tolerating treatments well.  No needs at this time.

## 2018-02-27 ENCOUNTER — Inpatient Hospital Stay: Payer: 59

## 2018-02-27 ENCOUNTER — Other Ambulatory Visit: Payer: Self-pay | Admitting: Hematology and Oncology

## 2018-02-27 VITALS — BP 125/81 | HR 82 | Temp 98.7°F | Resp 16 | Wt 153.8 lb

## 2018-02-27 DIAGNOSIS — C50411 Malignant neoplasm of upper-outer quadrant of right female breast: Secondary | ICD-10-CM

## 2018-02-27 DIAGNOSIS — Z95828 Presence of other vascular implants and grafts: Secondary | ICD-10-CM

## 2018-02-27 LAB — CBC WITH DIFFERENTIAL/PLATELET
Basophils Absolute: 0 10*3/uL (ref 0–0.1)
Basophils Relative: 0 %
Eosinophils Absolute: 0.1 10*3/uL (ref 0–0.7)
Eosinophils Relative: 2 %
HCT: 34.7 % — ABNORMAL LOW (ref 35.0–47.0)
Hemoglobin: 12.4 g/dL (ref 12.0–16.0)
Lymphocytes Relative: 6 %
Lymphs Abs: 0.5 10*3/uL — ABNORMAL LOW (ref 1.0–3.6)
MCH: 30.9 pg (ref 26.0–34.0)
MCHC: 35.7 g/dL (ref 32.0–36.0)
MCV: 86.5 fL (ref 80.0–100.0)
Monocytes Absolute: 0.8 10*3/uL (ref 0.2–0.9)
Monocytes Relative: 9 %
Neutro Abs: 7.5 10*3/uL — ABNORMAL HIGH (ref 1.4–6.5)
Neutrophils Relative %: 83 %
Platelets: 287 10*3/uL (ref 150–440)
RBC: 4.01 MIL/uL (ref 3.80–5.20)
RDW: 14.8 % — ABNORMAL HIGH (ref 11.5–14.5)
WBC: 8.9 10*3/uL (ref 3.6–11.0)

## 2018-02-27 LAB — COMPREHENSIVE METABOLIC PANEL
ALT: 22 U/L (ref 14–54)
AST: 23 U/L (ref 15–41)
Albumin: 3.6 g/dL (ref 3.5–5.0)
Alkaline Phosphatase: 75 U/L (ref 38–126)
Anion gap: 6 (ref 5–15)
BUN: 14 mg/dL (ref 6–20)
CO2: 24 mmol/L (ref 22–32)
Calcium: 8.4 mg/dL — ABNORMAL LOW (ref 8.9–10.3)
Chloride: 104 mmol/L (ref 101–111)
Creatinine, Ser: 0.86 mg/dL (ref 0.44–1.00)
GFR calc Af Amer: >60 mL/min (ref >60)
GFR calc non Af Amer: >60 mL/min (ref >60)
Glucose, Bld: 148 mg/dL — ABNORMAL HIGH (ref 65–99)
Potassium: 3.8 mmol/L (ref 3.5–5.1)
Sodium: 134 mmol/L — ABNORMAL LOW (ref 135–145)
Total Bilirubin: 0.8 mg/dL (ref 0.3–1.2)
Total Protein: 6.4 g/dL — ABNORMAL LOW (ref 6.5–8.1)

## 2018-02-27 MED ORDER — SODIUM CHLORIDE 0.9 % IV SOLN
Freq: Once | INTRAVENOUS | Status: AC
  Administered 2018-02-27: 10:00:00 via INTRAVENOUS
  Filled 2018-02-27: qty 1000

## 2018-02-27 MED ORDER — PACLITAXEL CHEMO INJECTION 300 MG/50ML
80.0000 mg/m2 | Freq: Once | INTRAVENOUS | Status: AC
  Administered 2018-02-27: 144 mg via INTRAVENOUS
  Filled 2018-02-27: qty 24

## 2018-02-27 MED ORDER — HEPARIN SOD (PORK) LOCK FLUSH 100 UNIT/ML IV SOLN
500.0000 [IU] | Freq: Once | INTRAVENOUS | Status: AC | PRN
  Administered 2018-02-27: 500 [IU]
  Filled 2018-02-27: qty 5

## 2018-02-27 MED ORDER — SODIUM CHLORIDE 0.9% FLUSH
10.0000 mL | INTRAVENOUS | Status: AC | PRN
  Administered 2018-02-27: 10 mL
  Filled 2018-02-27: qty 10

## 2018-02-27 MED ORDER — DIPHENHYDRAMINE HCL 50 MG/ML IJ SOLN
50.0000 mg | Freq: Once | INTRAMUSCULAR | Status: AC
  Administered 2018-02-27: 50 mg via INTRAVENOUS
  Filled 2018-02-27: qty 1

## 2018-02-27 MED ORDER — HEPARIN SOD (PORK) LOCK FLUSH 100 UNIT/ML IV SOLN: 500.0000 [IU] | INTRAVENOUS | Status: AC | PRN

## 2018-02-27 MED ORDER — SODIUM CHLORIDE 0.9% FLUSH
10.0000 mL | INTRAVENOUS | Status: DC | PRN
Start: 2018-02-27 — End: 2018-02-27
  Filled 2018-02-27: qty 10

## 2018-02-27 MED ORDER — FAMOTIDINE IN NACL 20-0.9 MG/50ML-% IV SOLN
20.0000 mg | Freq: Once | INTRAVENOUS | Status: AC
  Administered 2018-02-27: 20 mg via INTRAVENOUS
  Filled 2018-02-27: qty 50

## 2018-02-27 MED ORDER — SODIUM CHLORIDE 0.9 % IV SOLN
20.0000 mg | Freq: Once | INTRAVENOUS | Status: AC
  Administered 2018-02-27: 20 mg via INTRAVENOUS
  Filled 2018-02-27: qty 2

## 2018-03-04 ENCOUNTER — Encounter: Payer: Self-pay | Admitting: *Deleted

## 2018-03-04 NOTE — Progress Notes (Signed)
  Oncology Nurse Navigator Documentation  Navigator Location: [facility] (03/04/18 1600)   )Navigator Encounter Type: Telephone (03/04/18 1600) Telephone: Incoming Call (03/04/18 1600)                     Treatment Phase: Active Tx (03/04/18 1600) Barriers/Navigation Needs: Coordination of Care (03/04/18 1600) Education: Pain/ Symptom Management (03/04/18 1600) Interventions: Coordination of Care (03/04/18 1600)                      Time Spent with Patient: 30 (03/04/18 1600)   Patient called today with complaints of a temperature of 100.4 last night and 99's to 100.3 today.  States she has a "scratchy" throat, but attributes this to the pollen.  Patient scheduled for the symptom management clinic tomorrow at 9:30.  She is agreeable to the plan.

## 2018-03-05 ENCOUNTER — Inpatient Hospital Stay (HOSPITAL_BASED_OUTPATIENT_CLINIC_OR_DEPARTMENT_OTHER): Payer: 59 | Admitting: Nurse Practitioner

## 2018-03-05 ENCOUNTER — Other Ambulatory Visit: Payer: Self-pay

## 2018-03-05 ENCOUNTER — Inpatient Hospital Stay: Payer: 59

## 2018-03-05 ENCOUNTER — Ambulatory Visit
Admission: RE | Admit: 2018-03-05 | Discharge: 2018-03-05 | Disposition: A | Discharge status: Home / Self Care | Payer: 59 | Source: Ambulatory Visit | Attending: Nurse Practitioner | Admitting: Nurse Practitioner

## 2018-03-05 ENCOUNTER — Encounter: Payer: Self-pay | Admitting: Nurse Practitioner

## 2018-03-05 VITALS — BP 130/80 | HR 101 | Temp 100.5°F | Resp 16 | Wt 152.0 lb

## 2018-03-05 DIAGNOSIS — R509 Fever, unspecified: Secondary | ICD-10-CM

## 2018-03-05 DIAGNOSIS — J029 Acute pharyngitis, unspecified: Secondary | ICD-10-CM

## 2018-03-05 DIAGNOSIS — C50411 Malignant neoplasm of upper-outer quadrant of right female breast: Secondary | ICD-10-CM | POA: Diagnosis not present

## 2018-03-05 DIAGNOSIS — K8020 Calculus of gallbladder without cholecystitis without obstruction: Secondary | ICD-10-CM | POA: Diagnosis not present

## 2018-03-05 DIAGNOSIS — C773 Secondary and unspecified malignant neoplasm of axilla and upper limb lymph nodes: Secondary | ICD-10-CM

## 2018-03-05 DIAGNOSIS — Z801 Family history of malignant neoplasm of trachea, bronchus and lung: Secondary | ICD-10-CM

## 2018-03-05 DIAGNOSIS — R070 Pain in throat: Secondary | ICD-10-CM

## 2018-03-05 DIAGNOSIS — Z85828 Personal history of other malignant neoplasm of skin: Secondary | ICD-10-CM | POA: Diagnosis not present

## 2018-03-05 DIAGNOSIS — Z8614 Personal history of Methicillin resistant Staphylococcus aureus infection: Secondary | ICD-10-CM

## 2018-03-05 DIAGNOSIS — D0512 Intraductal carcinoma in situ of left breast: Secondary | ICD-10-CM

## 2018-03-05 DIAGNOSIS — Z170 Estrogen receptor positive status [ER+]: Secondary | ICD-10-CM | POA: Diagnosis not present

## 2018-03-05 DIAGNOSIS — Z800 Family history of malignant neoplasm of digestive organs: Secondary | ICD-10-CM | POA: Diagnosis not present

## 2018-03-05 DIAGNOSIS — Z87891 Personal history of nicotine dependence: Secondary | ICD-10-CM | POA: Diagnosis not present

## 2018-03-05 LAB — CBC WITH DIFFERENTIAL/PLATELET
Basophils Absolute: 0.1 10*3/uL (ref 0–0.1)
Basophils Relative: 1 %
Eosinophils Absolute: 0.1 10*3/uL (ref 0–0.7)
Eosinophils Relative: 1 %
HCT: 33.3 % — ABNORMAL LOW (ref 35.0–47.0)
Hemoglobin: 12 g/dL (ref 12.0–16.0)
Lymphocytes Relative: 4 %
Lymphs Abs: 0.3 10*3/uL — ABNORMAL LOW (ref 1.0–3.6)
MCH: 31 pg (ref 26.0–34.0)
MCHC: 36.1 g/dL — ABNORMAL HIGH (ref 32.0–36.0)
MCV: 85.8 fL (ref 80.0–100.0)
Monocytes Absolute: 0.7 10*3/uL (ref 0.2–0.9)
Monocytes Relative: 9 %
Neutro Abs: 6.6 10*3/uL — ABNORMAL HIGH (ref 1.4–6.5)
Neutrophils Relative %: 85 %
Platelets: 234 10*3/uL (ref 150–440)
RBC: 3.88 MIL/uL (ref 3.80–5.20)
RDW: 14.5 % (ref 11.5–14.5)
WBC: 7.8 10*3/uL (ref 3.6–11.0)

## 2018-03-05 LAB — COMPREHENSIVE METABOLIC PANEL
ALT: 26 U/L (ref 14–54)
AST: 28 U/L (ref 15–41)
Albumin: 3.6 g/dL (ref 3.5–5.0)
Alkaline Phosphatase: 82 U/L (ref 38–126)
Anion gap: 9 (ref 5–15)
BUN: 12 mg/dL (ref 6–20)
CO2: 22 mmol/L (ref 22–32)
Calcium: 8.7 mg/dL — ABNORMAL LOW (ref 8.9–10.3)
Chloride: 99 mmol/L — ABNORMAL LOW (ref 101–111)
Creatinine, Ser: 0.9 mg/dL (ref 0.44–1.00)
GFR calc Af Amer: >60 mL/min (ref >60)
GFR calc non Af Amer: >60 mL/min (ref >60)
Glucose, Bld: 206 mg/dL — ABNORMAL HIGH (ref 65–99)
Potassium: 3.7 mmol/L (ref 3.5–5.1)
Sodium: 130 mmol/L — ABNORMAL LOW (ref 135–145)
Total Bilirubin: 0.9 mg/dL (ref 0.3–1.2)
Total Protein: 6.7 g/dL (ref 6.5–8.1)

## 2018-03-05 LAB — INFLUENZA PANEL BY PCR (TYPE A & B)
Influenza A By PCR: NEGATIVE
Influenza B By PCR: NEGATIVE

## 2018-03-05 LAB — GROUP A STREP BY PCR: Group A Strep by PCR: NOT DETECTED

## 2018-03-05 MED ORDER — SODIUM CHLORIDE 0.9 % IV SOLN
Freq: Once | INTRAVENOUS | Status: AC
  Administered 2018-03-05: 10:00:00 via INTRAVENOUS
  Filled 2018-03-05: qty 1000

## 2018-03-05 MED ORDER — ACETAMINOPHEN 500 MG PO TABS
1000.0000 mg | ORAL_TABLET | Freq: Once | ORAL | Status: AC
  Administered 2018-03-05: 1000 mg via ORAL
  Filled 2018-03-05: qty 2

## 2018-03-05 NOTE — Progress Notes (Signed)
Patient here today as acute add on for sore throat and fever. She first noticed fever on Tuesday night...100.4.Marland KitchenMarland Kitchenit has fluctuated since then with a highest reading last night of 101.4. It is 100.5 right now...she also has a sore/scratchy throat. No other complaints.  Taxol last Friday; scheduled for Taxol tomorrow.Marland KitchenMarland KitchenShe has not been taking any Tylenol since she started the Taxol.

## 2018-03-05 NOTE — Progress Notes (Signed)
Symptom Management Consult note [facility]  Telephone:([phone] [phone]  Patient Care Team: Burnard Hawthorne, FNP as PCP - General (Family Medicine) Bary Castilla, Forest Gleason, MD (General Surgery) Leone Haven, MD as Consulting Physician (Family Medicine) Vladimir Crofts, MD as Consulting Physician (Neurology)   Name of the patient: Megan Vega  [id number]  1956/06/17   Date of visit: 03/05/18    Diagnosis- stage IIIB right breast cancer and DCIS of left breast  Chief complaint/ Reason for visit- Sore Throat and Fever  Heme/Onc history: Patient last seen by primary oncologist, Dr. Mike Gip on 02/20/18. She has  ahistory of clinical stage [id number] right breast cancer and DCIS of left breast s/p ultrasound guided buiopsy on 10/29/17. Pathology from right breast at 10 o'clock poisition 3 cm from nipple revealed 1.4 cm grade II invasive mammary carcinoma. Tumor was er + (>90%), PR + (90%), and Her2/neu 1+. Right axillary node biopsy revealed metastatic carcinoma. Left breast biopsy at the 2:00 position 3 cm from nipple revealed high grade dcis with comedonecrosis. CA27.29 was 23.5 on 11/14/17.   Bilateral diagnostic mammogram and ultrasound on 10/23/2017 revealed a 3.9 x 1.7 x 2.2 cm irregular hypoechoic mass with associated areas of vascularity at the 10 o'clock position 3 cm from the nipple and a 1.1 x 0.5 x 0.4 cm area of nodularity (3 approximately 3 mm nodules at 10 o'clock 6 cm from the nipple in the right breast. The mass was within a 5 cm area of pleomorphic microcalcifications.  Ultrasound revealed at least 5 prominent lymph nodes with diffuse thickened and hypoechoic cortices suggestive of metastatic disease.  In the left breast, there was an irregular 1.6 x 1.3 x 1.2 cm hypoechoic mass with indistinct margins at 2 o'clock 3 cm from the nipple.  Axillary ultrasound revealed normal nodes.  Bilateral breast MRI on 11/17/2017 revealed a 4.3 x  4.8 x 5 cm mass on the right centered in the upper outer quadrant but also extends into the lower outer and upper inner quadrants.  There was a small amount of enhancement posterior to the right nipple which could represent subtle extension. There was a cluster of nodules located posterior to the superior aspect of the known malignancy and another small nodule located posterior to the inferior aspect of the malignancy which could represent satellite lesions. Taking the enhancement posterior to the right nipple and the potential posterior satellite lesions into account, the AP dimension could be as large as 7 cm.  The patient's known DCIS spans 5 x 5 x 4.4 cm mammographically based on calcifications. The associated enhancement spans 3.2 x 3.2 x 3.3 cm. However, there were 2 small linearly enhancing regions at 6 o'clock in the left breast which could represent further disease. There was extensive adenopathy in the right axilla and right retropectoral region. There was at least 1 significantly enlarged right internal mammary lymph node.  PET scan on 11/24/2017 revealed hypermetabolic right axillary (2.6 x 3.1 cm; SUV 8.0), subpectoral/internal mammary (7 mm; SUV 2.4) adenopathy.  There was a 4 mm right lower lobe nodule is too small for PET resolution.  There was cholelithiasis.  She received 4 cycles of AC (12/05/2017 - 01/30/2018) with OnPro Neulasta support.  Breast ultrasound on 02/18/2018 revealed a 1.0 x 1.36 x 2.14 cm dominant mass in the upper outer quadrant of the right breast (>50% volume decrease). Axillary lymph nodes showed marked improvement with only 2 of 5previous identified lymph nodes identifiable (largest  1.3 cm; smaller node with cortical thickness 0.39 compared to 0.99).  Bone density on 12/16/2016 revealed osteopenia with a T-score of -1.6 in the left femoral neck on 12/16/2017.   Interval history- Megan Vega presents with fevers. She states the fever presented approximately 2  days ago and symptoms have been unchanged.  T-max 101.4. Earlier today oral temp 100.5.  Associated symptoms: Sore throat.  Patient denies abdominal pain, body aches, chills, diarrhea, fatigue, headache, nausea, earache, poor appetite, rash, URI symptoms, UTI symptoms, and/or vomiting.  Symptoms are no worse at any particular time of day and have been unchanged since onset.  Patient reports sleeping well.  Appetite is been stable.  Urine output is stable.  She has tried to alleviate symptoms with rest without relief.  Has not taken Tylenol.  Is receiving treatment for breast cancer.  Denies sick contacts.  ECOG FS:0 - Asymptomatic  Review of systems- Review of Systems  Constitutional: Positive for fever. Negative for chills, diaphoresis, malaise/fatigue and weight loss.  HENT: Positive for sore throat. Negative for congestion, ear pain and sinus pain.   Eyes: Negative.   Respiratory: Negative.   Cardiovascular: Negative.   Gastrointestinal: Negative.   Genitourinary: Negative.   Musculoskeletal: Negative.   Skin: Negative.   Neurological: Negative.   Endo/Heme/Allergies: Negative.   Psychiatric/Behavioral: Negative.     Current treatment- D1C3 Taxol (02/27/18)  Allergies  Allergen Reactions  . Peanut-Containing Drug Products Swelling and Other (See Comments)    Only when eating too many peanuts, swelling with lips and tingly face    Past Medical History:  Diagnosis Date  . Cancer ([place])    Basal Cell Carcinoma, about 2011  . Cataract   . Family history of adverse reaction to anesthesia    DAUGHTER-N/V  . Hemorrhoids   . History of methicillin resistant staphylococcus aureus (MRSA) 2015  . Migraines    MIGRAINES    Past Surgical History:  Procedure Laterality Date  . BREAST BIOPSY Bilateral 10/29/2017   L breast DCIS, R breast invasive mammary carcinoma of no special type, ER/PR+, Her2/neu 1+  . CESAREAN SECTION    . COLONOSCOPY  2012  . MANDIBLE FRACTURE SURGERY    .  PORTACATH PLACEMENT Left 11/28/2017   Procedure: INSERTION PORT-A-CATH;  Surgeon: Robert Bellow, MD;  Location: [facility] ORS;  Service: General;  Laterality: Left;    Social History   Socioeconomic History  . Marital status: Divorced    Spouse name: Not on file  . Number of children: Not on file  . Years of education: Not on file  . Highest education level: Not on file  Occupational History  . Not on file  Social Needs  . Financial resource strain: Not on file  . Food insecurity:    Worry: Not on file    Inability: Not on file  . Transportation needs:    Medical: Not on file    Non-medical: Not on file  Tobacco Use  . Smoking status: Former Smoker    Packs/day: 0.25    Years: 10.00    Pack years: 2.50    Last attempt to quit: 1978    Years since quitting: 41.3  . Smokeless tobacco: Never Used  Substance and Sexual Activity  . Alcohol use: Yes    Alcohol/week: 0.6 oz    Types: 1 Glasses of wine per week    Comment: RARE  . Drug use: No  . Sexual activity: Not Currently  Lifestyle  . Physical activity:  Days per week: Not on file    Minutes per session: Not on file  . Stress: Not on file  Relationships  . Social connections:    Talks on phone: Not on file    Gets together: Not on file    Attends religious service: Not on file    Active member of club or organization: Not on file    Attends meetings of clubs or organizations: Not on file    Relationship status: Not on file  . Intimate partner violence:    Fear of current or ex partner: Not on file    Emotionally abused: Not on file    Physically abused: Not on file    Forced sexual activity: Not on file  Other Topics Concern  . 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    Lives at home (son 54 yo lives with her)   Children 2   Pets: 3 small dogs and 1 frog    Caffeine- 1 cup of coffee in the morning, rare tea    Family History  Problem Relation Age of Onset  . Cancer  Mother        Esophageal Cancer  . Early death Mother        Age 29  . Cancer Father        Lung Cancer  . Diabetes Father   . Diabetes Brother        Controlled with diet  . Heart attack Paternal Grandfather      Current Outpatient Medications:  .  b complex vitamins tablet, Take 1 tablet by mouth daily., Disp: , Rfl:  .  dexamethasone (DECADRON) 4 MG tablet, Take 2 tablets by mouth once a day on the day after chemotherapy and then take 2 tablets two times a day for 2 days. Take with food., Disp: 30 tablet, Rfl: 1 .  lidocaine-prilocaine (EMLA) cream, Apply to affected area once, Disp: 30 g, Rfl: 3 .  loratadine (CLARITIN) 10 MG tablet, Take 10 mg by mouth daily., Disp: , Rfl:  .  LORazepam (ATIVAN) 0.5 MG tablet, Take 1 tablet (0.5 mg total) by mouth every 6 (six) hours as needed (Nausea or vomiting)., Disp: 30 tablet, Rfl: 0 .  Multiple Vitamins-Minerals (HAIR/SKIN/NAILS/BIOTIN PO), Take 1 tablet by mouth daily. , Disp: , Rfl:  .  ondansetron (ZOFRAN) 8 MG tablet, Take 1 tablet (8 mg total) by mouth 2 (two) times daily as needed. Start on the third day after chemotherapy., Disp: 30 tablet, Rfl: 1 .  polyethylene glycol (MIRALAX / GLYCOLAX) packet, Take 17 g by mouth daily as needed for mild constipation., Disp: , Rfl:  .  acetaminophen (TYLENOL) 500 MG tablet, Take 1,000 mg by mouth every 6 (six) hours as needed for moderate pain or headache., Disp: , Rfl:  No current facility-administered medications for this visit.   Facility-Administered Medications Ordered in Other Visits:  .  heparin lock flush 100 unit/mL, 500 Units, Intracatheter, PRN, Lequita Asal, MD  Physical exam:  Vitals:   03/05/18 0934 03/05/18 0937  BP: 130/80   Pulse: (!) 101   Resp:  16  Temp: (!) 100.5 F (38.1 C)   TempSrc: Tympanic   SpO2:  97%  Weight: 152 lb (68.9 kg)    Physical Exam  Constitutional: She is oriented to person, place, and time. She appears well-developed and well-nourished. No  distress.  HENT:  Head: Normocephalic and atraumatic.  Right Ear:  Hearing normal.  Left Ear: Hearing, tympanic membrane and ear canal normal.  Nose: Nose normal. Right sinus exhibits no maxillary sinus tenderness and no frontal sinus tenderness. Left sinus exhibits no maxillary sinus tenderness and no frontal sinus tenderness.  Mouth/Throat: Uvula is midline and mucous membranes are normal. Posterior oropharyngeal erythema present. No tonsillar exudate.  TM view obstructed by cerumen  Eyes: Pupils are equal, round, and reactive to light. EOM are normal.  Neck: Normal range of motion. Neck supple.  Cardiovascular: Normal rate, regular rhythm and normal heart sounds.  Pulmonary/Chest: Effort normal and breath sounds normal.  Abdominal: Soft. Bowel sounds are normal. She exhibits no distension.  Neurological: She is alert and oriented to person, place, and time.  Skin: Skin is warm and dry. No rash noted. She is not diaphoretic.  Psychiatric: She has a normal mood and affect. Her behavior is normal.     CMP Latest Ref Rng & Units 03/05/2018  Glucose 65 - 99 mg/dL 206(H)  BUN 6 - 20 mg/dL 12  Creatinine 0.44 - 1.00 mg/dL 0.90  Sodium 135 - 145 mmol/L 130(L)  Potassium 3.5 - 5.1 mmol/L 3.7  Chloride 101 - 111 mmol/L 99(L)  CO2 22 - 32 mmol/L 22  Calcium 8.9 - 10.3 mg/dL 8.7(L)  Total Protein 6.5 - 8.1 g/dL 6.7  Total Bilirubin 0.3 - 1.2 mg/dL 0.9  Alkaline Phos 38 - 126 U/L 82  AST 15 - 41 U/L 28  ALT 14 - 54 U/L 26   CBC Latest Ref Rng & Units 03/05/2018  WBC 3.6 - 11.0 K/uL 7.8  Hemoglobin 12.0 - 16.0 g/dL 12.0  Hematocrit 35.0 - 47.0 % 33.3(L)  Platelets 150 - 440 K/uL 234    No images are attached to the encounter.  US Breast Complete Uni Left Inc Axilla  Result Date: 02/18/2018 Ultrasound examination of the breast was undertaken to determine response to doxorubicin chemotherapy recently completed.  The dominant mass in the upper outer quadrant of the right breast shows marked  decrease in size.  This measures now approximately 1.0 x 1.36 x 2.14 cm.  Axillary lymph nodes show marked improvement with only 2 of  5 previous identified  lymph nodes identifiable.  The largest measures just over 1.3 cm.  The smaller shows a marked improvement in cortical thickness from 0.99 decreased 0.39.  BI-RADS-6  At the time of her December 2018 exam prior to neoadjuvant chemotherapy based by showed a mass measuring up to 3.9 cm in diameter.  Today's exam would suggest a greater than 50% decrease in volume of the right upper outer quadrant lesion.  Ultrasound exam of the left breast in the area of high-grade DCIS was not repeated today.     Assessment and plan- Patient is a 63 y.o. female with history of stage IIIb right breast cancer and DCIS of the left breast who presents to symptom management clinic for complaints of fever and sore throat.  1. Stage IIIb right breast cancer and left breast DCIS-currently s/p week 2 Taxol.  Ultrasound on 02/18/2018 shows marked decrease in size of dominant right breast mass.  Given fever (see discussion below) we will hold treatment for 03/06/2018.  Dr. Mike Gip aware and verbalizes agreement.  Patient to return to clinic for labs and reevaluation and consideration of Taxol on 03/13/2018.  2.  Fever-unknown etiology- possibly viral.  Vague mild symptoms.  Rapid strep pending.  Flu swab pending.  Chest x-ray pending.  Not neutropenic.  Oral temperature improved with  Tylenol 1000 mg and IV fluids. Patient reports feeling 'better' and comfortable with discharge. Will hold empiric antibiotics at this time.  If fever persists will consider further work-up and/or empiric antibiotics.     Visit Diagnosis 1. Fever, unspecified fever cause   2. Primary cancer of upper outer quadrant of right breast ([place])   3. Ductal carcinoma in situ (DCIS) of left breast     Patient expressed understanding and was in agreement with this plan. She also understands that She can  call clinic at any time with any questions, concerns, or complaints.   A total of (15) minutes of face-to-face time was spent with this patient with greater than 50% of that time in counseling and care-coordination.   Beckey Rutter, DNP, AGNP-C [place] [facility] [phone] (work cell) [phone] (office) 03/05/18 12:07 PM

## 2018-03-06 ENCOUNTER — Inpatient Hospital Stay: Payer: 59

## 2018-03-08 ENCOUNTER — Encounter: Payer: Self-pay | Admitting: Nurse Practitioner

## 2018-03-09 ENCOUNTER — Inpatient Hospital Stay: Payer: 59

## 2018-03-09 ENCOUNTER — Encounter: Payer: Self-pay | Admitting: Nurse Practitioner

## 2018-03-09 ENCOUNTER — Encounter: Payer: Self-pay | Admitting: *Deleted

## 2018-03-09 ENCOUNTER — Encounter: Payer: Self-pay | Admitting: General Surgery

## 2018-03-09 ENCOUNTER — Inpatient Hospital Stay (HOSPITAL_BASED_OUTPATIENT_CLINIC_OR_DEPARTMENT_OTHER): Payer: 59 | Admitting: Nurse Practitioner

## 2018-03-09 VITALS — BP 152/90 | HR 108 | Temp 102.4°F | Resp 20 | Wt 151.7 lb

## 2018-03-09 DIAGNOSIS — Z170 Estrogen receptor positive status [ER+]: Secondary | ICD-10-CM

## 2018-03-09 DIAGNOSIS — E876 Hypokalemia: Secondary | ICD-10-CM

## 2018-03-09 DIAGNOSIS — C773 Secondary and unspecified malignant neoplasm of axilla and upper limb lymph nodes: Secondary | ICD-10-CM | POA: Diagnosis not present

## 2018-03-09 DIAGNOSIS — D0512 Intraductal carcinoma in situ of left breast: Secondary | ICD-10-CM

## 2018-03-09 DIAGNOSIS — K8020 Calculus of gallbladder without cholecystitis without obstruction: Secondary | ICD-10-CM | POA: Diagnosis not present

## 2018-03-09 DIAGNOSIS — R509 Fever, unspecified: Secondary | ICD-10-CM

## 2018-03-09 DIAGNOSIS — Z801 Family history of malignant neoplasm of trachea, bronchus and lung: Secondary | ICD-10-CM

## 2018-03-09 DIAGNOSIS — Z800 Family history of malignant neoplasm of digestive organs: Secondary | ICD-10-CM

## 2018-03-09 DIAGNOSIS — Z87891 Personal history of nicotine dependence: Secondary | ICD-10-CM

## 2018-03-09 DIAGNOSIS — C50411 Malignant neoplasm of upper-outer quadrant of right female breast: Secondary | ICD-10-CM

## 2018-03-09 DIAGNOSIS — Z85828 Personal history of other malignant neoplasm of skin: Secondary | ICD-10-CM | POA: Diagnosis not present

## 2018-03-09 DIAGNOSIS — Z8614 Personal history of Methicillin resistant Staphylococcus aureus infection: Secondary | ICD-10-CM

## 2018-03-09 DIAGNOSIS — R2231 Localized swelling, mass and lump, right upper limb: Secondary | ICD-10-CM

## 2018-03-09 DIAGNOSIS — R222 Localized swelling, mass and lump, trunk: Secondary | ICD-10-CM | POA: Diagnosis not present

## 2018-03-09 LAB — CBC WITH DIFFERENTIAL/PLATELET
BASOS ABS: 0.1 10*3/uL (ref 0–0.1)
Basophils Relative: 1 %
EOS ABS: 0.2 10*3/uL (ref 0–0.7)
Eosinophils Relative: 2 %
HCT: 31.5 % — ABNORMAL LOW (ref 35.0–47.0)
HEMOGLOBIN: 11 g/dL — AB (ref 12.0–16.0)
LYMPHS ABS: 0.7 10*3/uL — AB (ref 1.0–3.6)
LYMPHS PCT: 8 %
MCH: 30.1 pg (ref 26.0–34.0)
MCHC: 34.9 g/dL (ref 32.0–36.0)
MCV: 86.2 fL (ref 80.0–100.0)
Monocytes Absolute: 1 10*3/uL — ABNORMAL HIGH (ref 0.2–0.9)
Monocytes Relative: 11 %
NEUTROS PCT: 78 %
Neutro Abs: 6.9 10*3/uL — ABNORMAL HIGH (ref 1.4–6.5)
Platelets: 286 10*3/uL (ref 150–440)
RBC: 3.65 MIL/uL — AB (ref 3.80–5.20)
RDW: 14.8 % — ABNORMAL HIGH (ref 11.5–14.5)
WBC: 8.7 10*3/uL (ref 3.6–11.0)

## 2018-03-09 LAB — COMPREHENSIVE METABOLIC PANEL
ALT: 27 U/L (ref 14–54)
AST: 34 U/L (ref 15–41)
Albumin: 3.5 g/dL (ref 3.5–5.0)
Alkaline Phosphatase: 116 U/L (ref 38–126)
Anion gap: 11 (ref 5–15)
BUN: 7 mg/dL (ref 6–20)
CHLORIDE: 99 mmol/L — AB (ref 101–111)
CO2: 23 mmol/L (ref 22–32)
Calcium: 8.5 mg/dL — ABNORMAL LOW (ref 8.9–10.3)
Creatinine, Ser: 0.74 mg/dL (ref 0.44–1.00)
Glucose, Bld: 112 mg/dL — ABNORMAL HIGH (ref 65–99)
POTASSIUM: 2.9 mmol/L — AB (ref 3.5–5.1)
Sodium: 133 mmol/L — ABNORMAL LOW (ref 135–145)
Total Bilirubin: 0.6 mg/dL (ref 0.3–1.2)
Total Protein: 7.1 g/dL (ref 6.5–8.1)

## 2018-03-09 LAB — URINALYSIS, COMPLETE (UACMP) WITH MICROSCOPIC
BILIRUBIN URINE: NEGATIVE
Glucose, UA: NEGATIVE mg/dL
Ketones, ur: NEGATIVE mg/dL
Leukocytes, UA: NEGATIVE
Nitrite: NEGATIVE
Protein, ur: NEGATIVE mg/dL
SPECIFIC GRAVITY, URINE: 1.016 (ref 1.005–1.030)
pH: 6 (ref 5.0–8.0)

## 2018-03-09 LAB — MAGNESIUM: Magnesium: 1.8 mg/dL (ref 1.7–2.4)

## 2018-03-09 MED ORDER — POTASSIUM CHLORIDE CRYS ER 20 MEQ PO TBCR: 20.0000 meq | EXTENDED_RELEASE_TABLET | Freq: Two times a day (BID) | ORAL | 0 refills | Status: DC

## 2018-03-09 MED ORDER — SULFAMETHOXAZOLE-TRIMETHOPRIM 800-160 MG PO TABS: 1.0000 | ORAL_TABLET | Freq: Two times a day (BID) | ORAL | 0 refills | Status: DC

## 2018-03-09 MED ORDER — ACETAMINOPHEN 500 MG PO TABS
1000.0000 mg | ORAL_TABLET | Freq: Once | ORAL | Status: AC
  Administered 2018-03-09: 1000 mg via ORAL

## 2018-03-09 MED ORDER — ACETAMINOPHEN 500 MG PO TABS
ORAL_TABLET | ORAL | Status: AC
  Filled 2018-03-09: qty 2

## 2018-03-09 NOTE — Progress Notes (Signed)
Patient here as an acute add on for fever. Fever has persisted since she was here in clinic last week. She also noticed a lump under her right arm yesterday that she describes as being the size of a tangerine.

## 2018-03-09 NOTE — Progress Notes (Signed)
The patient was seen at the cancer center this afternoon with reports of 6 days of progressively rising fever and newly appreciated right axillary fullness.  Mild tachycardia.  Reports of mild discomfort in the axilla.  On her most recent exam there was evidence of resolution of most of the lymphadenopathy since initiation of neoadjuvant chemotherapy.  Clinical examination of the right axilla showed no erythema or skin induration.  There is a vague fullness of the apex of the axilla that was scantly tender.  By report, leukocyte count is near normal.  Blood cultures have been obtained.  No point source identified at this time.  Patient will present tomorrow for reassessment in the office.

## 2018-03-09 NOTE — Progress Notes (Signed)
  Oncology Nurse Navigator Documentation  Navigator Location: [facility] (03/09/18 1300)   )Navigator Encounter Type: Telephone (03/09/18 1300) Telephone: Incoming Call (03/09/18 1300)                     Treatment Phase: Active Tx (03/09/18 1300) Barriers/Navigation Needs: Coordination of Care (03/09/18 1300) Education: Pain/ Symptom Management (03/09/18 1300) Interventions: Coordination of Care (03/09/18 1300)                      Time Spent with Patient: 30 (03/09/18 1300)   Patient called today and complains of a continuous fever over the weekend, even with taking Tylenol every 8 hours.  States she also has an axillary "lump".  Discussed with Sonia Baller our NP, and she has agreed to see her in our symptom management clinic.  Patient to arrive at 2:00 today.

## 2018-03-09 NOTE — Progress Notes (Signed)
Symptom Management Consult Note [facility]  Telephone:([phone] [phone]  Patient Care Team: Burnard Hawthorne, FNP as PCP - General (Family Medicine) Megan Vega, Megan Gleason, MD (General Surgery) Megan Haven, MD as Consulting Physician (Family Medicine) Megan Crofts, MD as Consulting Physician (Neurology)   Name of the patient: Megan Vega  [id number]  [birth year]   Date of visit: 03/11/18   Diagnosis- stage IIIB right breast cancer and DCIS of left breast  Chief complaint/ Reason for visit- Sore Throat and Fever    Heme/Onc history: Patient last seen by primary oncologist, Dr. Mike Vega on 02/20/18. She has a history of clinical stage [id number] right breast cancer and DCIS of left breast s/p ultrasound guided biopsy on 10/29/17. Pathology from right breast at 10 o'clock position 3 cm from nipple revealed 1.4 cm grade II invasive mammary carcinoma. Tumor was er + (>90%), PR + (90%), and Her2/neu 1+. Right axillary node biopsy revealed metastatic carcinoma. Left breast biopsy at the 2:00 position 3 cm from nipple revealed high grade dcis with comedonecrosis. CA27.29 was 23.5 on 11/14/17.   Bilateral diagnostic mammogram and ultrasound on 10/23/2017 revealed a 3.9 x 1.7 x 2.2 cm irregular hypoechoic mass with associated areas of vascularity at the 10 o'clock position 3 cm from the nipple and a 1.1 x 0.5 x 0.4 cm area of nodularity (3 approximately 3 mm nodules at 10 o'clock 6 cm from the nipple in the right breast. The mass was within a 5 cm area of pleomorphic microcalcifications.  Ultrasound revealed at least 5 prominent lymph nodes with diffuse thickened and hypoechoic cortices suggestive of metastatic disease.  In the left breast, there was an irregular 1.6 x 1.3 x 1.2 cm hypoechoic mass with indistinct margins at 2 o'clock 3 cm from the nipple.  Axillary ultrasound revealed normal nodes.  Bilateral breast MRI on 11/17/2017 revealed a 4.3 x 4.8 x 5  cm mass on the right centered in the upper outer quadrant but also extends into the lower outer and upper inner quadrants.  There was a small amount of enhancement posterior to the right nipple which could represent subtle extension. There was a cluster of nodules located posterior to the superior aspect of the known malignancy and another small nodule located posterior to the inferior aspect of the malignancy which could represent satellite lesions. Taking the enhancement posterior to the right nipple and the potential posterior satellite lesions into account, the AP dimension could be as large as 7 cm.  The patient's known DCIS spans 5 x 5 x 4.4 cm mammographically based on calcifications. The associated enhancement spans 3.2 x 3.2 x 3.3 cm. However, there were 2 small linearly enhancing regions at 6 o'clock in the left breast which could represent further disease. There was extensive adenopathy in the right axilla and right retropectoral region. There was at least 1 significantly enlarged right internal mammary lymph node.  PET scan on 11/24/2017 revealed hypermetabolic right axillary (2.6 x 3.1 cm; SUV 8.0), subpectoral/internal mammary (7 mm; SUV 2.4) adenopathy.  There was a 4 mm right lower lobe nodule is too small for PET resolution.  There was cholelithiasis.  She received 4 cycles of AC (12/05/2017 - 01/30/2018) with OnPro Neulasta support.  Breast ultrasound on 02/18/2018 by Dr. Bary Vega revealed a 1.0 x 1.36 x 2.14 cm dominant mass in the upper outer quadrant of the right breast (>50% volume decrease). Axillary lymph nodes showed marked improvement with only 2 of 5previous identified lymph nodes  identifiable (largest 1.3 cm; smaller node with cortical thickness 0.39 compared to 0.99).  Bone density on 12/16/2016 revealed osteopenia with a T-score of -1.6 in the left femoral neck on 12/16/2017.   Interval history- Megan Vega presents with complaints of fever. Fever initially presented 6  days ago and she was seen in [facility] on 03/05/18 for same. At that time, she denies symptoms and T-max was 101.4. She had not been taking medication and only symptom was sore throat. Flu swab was negative, strep was negative, chest x-ray was negative. She took Tylenol over weekend and monitored her temperature.  Today, patient returns to clinic complaining of continued fever over weekend. T-max now 102.4. She has been increasing her fluid intake and taking Tylenol every 8 hours without improvement. Associated symptoms: sore throat, chest congestion similar to episodes of seasonal allergies, and new right axillary mass. She states that mass appeared 2 days ago and has not changed since that time. She estimates it is the size of a tangerine and is painful. She states she is now having chills.   ECOG FS:1 - Symptomatic but completely ambulatory  Review of systems- Review of Systems  Constitutional: Positive for chills, fever and malaise/fatigue. Negative for diaphoresis and weight loss.  HENT: Positive for sore throat. Negative for congestion, ear pain and sinus pain.   Eyes: Negative.   Respiratory: Negative for cough, hemoptysis, sputum production, shortness of breath and wheezing.        Chest congestion  Cardiovascular: Negative.   Gastrointestinal: Negative.   Genitourinary: Negative.   Musculoskeletal: Negative.   Skin:       Right axillary mass, hard, size of tangerine, painful to palpation, appeared suddenly, not hot.   Neurological: Negative.   Endo/Heme/Allergies: Negative.   Psychiatric/Behavioral: Negative.     Current treatment- D1C3 Taxol (02/27/18)  Allergies  Allergen Reactions  . Peanut-Containing Drug Products Swelling and Other (See Comments)    Only when eating too many peanuts, swelling with lips and tingly face    Past Medical History:  Diagnosis Date  . Cancer ([place])    Basal Cell Carcinoma, about 2011  . Cataract   . Family history of adverse reaction to anesthesia     DAUGHTER-N/V  . Hemorrhoids   . History of methicillin resistant staphylococcus aureus (MRSA) 2015  . Migraines    MIGRAINES    Past Surgical History:  Procedure Laterality Date  . BREAST BIOPSY Bilateral 10/29/2017   L breast DCIS, R breast invasive mammary carcinoma of no special type, ER/PR+, Her2/neu 1+  . CESAREAN SECTION    . COLONOSCOPY  2012  . MANDIBLE FRACTURE SURGERY    . PORTACATH PLACEMENT Left 11/28/2017   Procedure: INSERTION PORT-A-CATH;  Surgeon: Robert Bellow, MD;  Location: [facility] ORS;  Service: General;  Laterality: Left;    Social History   Socioeconomic History  . Marital status: Divorced    Spouse name: Not on file  . Number of children: Not on file  . Years of education: Not on file  . Highest education level: Not on file  Occupational History  . Not on file  Social Needs  . Financial resource strain: Not on file  . Food insecurity:    Worry: Not on file    Inability: Not on file  . Transportation needs:    Medical: Not on file    Non-medical: Not on file  Tobacco Use  . Smoking status: Former Smoker    Packs/day: 0.25    Years:  10.00    Pack years: 2.50    Last attempt to quit: 1978    Years since quitting: 41.3  . Smokeless tobacco: Never Used  Substance and Sexual Activity  . Alcohol use: Yes    Alcohol/week: 0.6 oz    Types: 1 Glasses of wine per week    Comment: RARE  . Drug use: No  . Sexual activity: Not Currently  Lifestyle  . Physical activity:    Days per week: Not on file    Minutes per session: Not on file  . Stress: Not on file  Relationships  . Social connections:    Talks on phone: Not on file    Gets together: Not on file    Attends religious service: Not on file    Active member of club or organization: Not on file    Attends meetings of clubs or organizations: Not on file    Relationship status: Not on file  . Intimate partner violence:    Fear of current or ex partner: Not on file    Emotionally abused: Not  on file    Physically abused: Not on file    Forced sexual activity: Not on file  Other Topics Concern  . 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    Lives at home (son 73 yo lives with her)   Children 2   Pets: 3 small dogs and 1 frog    Caffeine- 1 cup of coffee in the morning, rare tea    Family History  Problem Relation Age of Onset  . Cancer Mother        Esophageal Cancer  . Early death Mother        Age 13  . Cancer Father        Lung Cancer  . Diabetes Father   . Diabetes Brother        Controlled with diet  . Heart attack Paternal Grandfather     Current Outpatient Medications:  .  acetaminophen (TYLENOL) 500 MG tablet, Take 1,000 mg by mouth every 6 (six) hours as needed for moderate pain or headache., Disp: , Rfl:  .  b complex vitamins tablet, Take 1 tablet by mouth daily., Disp: , Rfl:  .  dexamethasone (DECADRON) 4 MG tablet, Take 2 tablets by mouth once a day on the day after chemotherapy and then take 2 tablets two times a day for 2 days. Take with food., Disp: 30 tablet, Rfl: 1 .  lidocaine-prilocaine (EMLA) cream, Apply to affected area once, Disp: 30 g, Rfl: 3 .  loratadine (CLARITIN) 10 MG tablet, Take 10 mg by mouth daily., Disp: , Rfl:  .  LORazepam (ATIVAN) 0.5 MG tablet, Take 1 tablet (0.5 mg total) by mouth every 6 (six) hours as needed (Nausea or vomiting)., Disp: 30 tablet, Rfl: 0 .  Multiple Vitamins-Minerals (HAIR/SKIN/NAILS/BIOTIN PO), Take 1 tablet by mouth daily. , Disp: , Rfl:  .  ondansetron (ZOFRAN) 8 MG tablet, Take 1 tablet (8 mg total) by mouth 2 (two) times daily as needed. Start on the third day after chemotherapy., Disp: 30 tablet, Rfl: 1 .  polyethylene glycol (MIRALAX / GLYCOLAX) packet, Take 17 g by mouth daily as needed for mild constipation., Disp: , Rfl:  .  potassium chloride SA (K-DUR,KLOR-CON) 20 MEQ tablet, Take 1 tablet (20 mEq total) by mouth 2 (two) times daily., Disp: 14 tablet, Rfl: 0 .   sulfamethoxazole-trimethoprim (BACTRIM DS,SEPTRA  DS) 800-160 MG tablet, Take 1 tablet by mouth 2 (two) times daily., Disp: 14 tablet, Rfl: 0 No current facility-administered medications for this visit.   Facility-Administered Medications Ordered in Other Visits:  .  heparin lock flush 100 unit/mL, 500 Units, Intracatheter, PRN, Lequita Asal, MD  Physical exam:  Vitals:   03/09/18 1452  BP: (!) 152/90  Pulse: (!) 108  Resp: 20  Temp: (!) 102.4 F (39.1 C)  TempSrc: Tympanic  SpO2: 97%  Weight: 151 lb 11.2 oz (68.8 kg)   Physical Exam  Constitutional: She is oriented to person, place, and time. She appears well-developed and well-nourished. No distress.  HENT:  Head: Normocephalic and atraumatic.  Right Ear: Hearing normal.  Left Ear: Hearing, tympanic membrane and ear canal normal.  Nose: Right sinus exhibits no maxillary sinus tenderness and no frontal sinus tenderness. Left sinus exhibits no maxillary sinus tenderness and no frontal sinus tenderness.  Mouth/Throat: Uvula is midline and mucous membranes are normal. Posterior oropharyngeal erythema present. No oropharyngeal exudate. No tonsillar exudate.  Eyes: Pupils are equal, round, and reactive to light. EOM are normal.  Neck: Normal range of motion. Neck supple.  Cardiovascular: Regular rhythm and normal heart sounds.  tachycardic  Pulmonary/Chest: Effort normal and breath sounds normal.  Abdominal: Soft. Bowel sounds are normal. She exhibits no distension.  Neurological: She is alert and oriented to person, place, and time.  Skin: Skin is warm and dry. No rash noted. She is not diaphoretic.  Right axillary mass: estimated 3-4 cm, round, not erythematous, no fluctuant head, firm, not hot.   Psychiatric: She has a normal mood and affect. Her behavior is normal.     CMP Latest Ref Rng & Units 03/09/2018  Glucose 65 - 99 mg/dL 112(H)  BUN 6 - 20 mg/dL 7  Creatinine 0.44 - 1.00 mg/dL 0.74  Sodium 135 - 145 mmol/L  133(L)  Potassium 3.5 - 5.1 mmol/L 2.9(L)  Chloride 101 - 111 mmol/L 99(L)  CO2 22 - 32 mmol/L 23  Calcium 8.9 - 10.3 mg/dL 8.5(L)  Total Protein 6.5 - 8.1 g/dL 7.1  Total Bilirubin 0.3 - 1.2 mg/dL 0.6  Alkaline Phos 38 - 126 U/L 116  AST 15 - 41 U/L 34  ALT 14 - 54 U/L 27   CBC Latest Ref Rng & Units 03/09/2018  WBC 3.6 - 11.0 K/uL 8.7  Hemoglobin 12.0 - 16.0 g/dL 11.0(L)  Hematocrit 35.0 - 47.0 % 31.5(L)  Platelets 150 - 440 K/uL 286   No images are attached to the encounter.  Dg Chest 2 View  Result Date: 03/05/2018 CLINICAL DATA:  Fever.  Breast cancer EXAM: CHEST - 2 VIEW COMPARISON:  11/28/2017 FINDINGS: Lungs are clear without infiltrate or effusion. Negative for heart failure. Cardiac and mediastinal contours normal. Port-A-Cath tip in the SVC. IMPRESSION: No active cardiopulmonary disease. Electronically Signed   By: Franchot Gallo M.D.   On: 03/05/2018 17:04   US Breast Complete Uni Left Inc Axilla  Result Date: 02/18/2018 Ultrasound examination of the breast was undertaken to determine response to doxorubicin chemotherapy recently completed.  The dominant mass in the upper outer quadrant of the right breast shows marked decrease in size.  This measures now approximately 1.0 x 1.36 x 2.14 cm.  Axillary lymph nodes show marked improvement with only 2 of  5 previous identified  lymph nodes identifiable.  The largest measures just over 1.3 cm.  The smaller shows a marked improvement in cortical thickness from 0.99 decreased 0.39.  BI-RADS-6  At the time of her December 2018 exam prior to neoadjuvant chemotherapy based by showed a mass measuring up to 3.9 cm in diameter.  Today's exam would suggest a greater than 50% decrease in volume of the right upper outer quadrant lesion.  Ultrasound exam of the left breast in the area of high-grade DCIS was not repeated today.     Assessment and plan- Patient is a 62 y.o. female with history of stage IIIb right breast cancer and DCIS of the  left breast who presents to symptom management clinic for complaints of fever and right axillary mass.    1. Stage IIIb right breast cancer and left breast DCIS-currently s/p week 2 Taxol.  02/18/18 Ultrasound with Dr. Bary Vega showed marked decrease in size of dominant right breast mass. Chemo held on 03/06/18 d/t fever. Scheduled for treatment on 03/13/18 but anticipate that will need to be held pending ultrasound of new axillary mass (see discussion below)  2.  Right axillary mass- previously no evidence of mass on 02/18/18 ultrasound. Prior hx of MRSA in 2015. Mass suspicious for abscess (no fluctuant head) vs necrotic lymph node. Dr. Bary Vega consulted and able to see patient in [facility] with NP. He agrees to see patient on 03/10/18 for ultrasound and further management. If Dr. Bary Vega is able to aspirate, may be able to culture.   3. Fever- suspect r/t underlying infection r/t right axillary mass. Blood cultures collected today, WBC 8.7, ANC 6.9 (H). UA negative. Previously chest x-ray negative. Will start on Bactrim DS x 7 days. Educational hand-out provided to patient regarding risks and side effects. Will follow kidney function while on Bactrim.   4. Hypokalemia- K 2.9 today. Suspect related to poor dietary intake d/t fever. Will supplement with oral potassium 20, BID and plan to re-check labs later this week (with Dr. Mike Vega). Appreciate Dr. Kem Parkinson continued management.   Follow-up with Dr. Bary Vega on 03/10/18 for ultrasound & further evaluation. RTC on 03/13/18 for labs and re-evaluation with Dr. Mike Vega.     Visit Diagnosis 1. Ductal carcinoma in situ (DCIS) of left breast   2. Axillary mass, right   3. Hypokalemia     Patient expressed understanding and was in agreement with this plan. She also understands that She can call clinic at any time with any questions, concerns, or complaints.   A total of (25) minutes of face-to-face time was spent with this patient with greater than 50% of that  time in counseling and care-coordination.  Thank you for allowing me to participate in the care of this very pleasant patient.    Beckey Rutter, DNP, AGNP-C [place] [facility] [phone] (work cell) [phone] (office) 03/11/18 3:21 PM   Cc: Dr. Nolon Stalls

## 2018-03-09 NOTE — Patient Instructions (Addendum)
Hypokalemia Hypokalemia means that the amount of potassium in the blood is lower than normal.Potassium is a chemical that helps regulate the amount of fluid in the body (electrolyte). It also stimulates muscle tightening (contraction) and helps nerves work properly.Normally, most of the body's potassium is inside of cells, and only a very small amount is in the blood. Because the amount in the blood is so small, minor changes to potassium levels in the blood can be life-threatening. What are the causes? This condition may be caused by:  Antibiotic medicine.  Diarrhea or vomiting. Taking too much of a medicine that helps you have a bowel movement (laxative) can cause diarrhea and lead to hypokalemia.  Chronic kidney disease (CKD).  Medicines that help the body get rid of excess fluid (diuretics).  Eating disorders, such as bulimia.  Low magnesium levels in the body.  Sweating a lot.  What are the signs or symptoms? Symptoms of this condition include:  Weakness.  Constipation.  Fatigue.  Muscle cramps.  Mental confusion.  Skipped heartbeats or irregular heartbeat (palpitations).  Tingling or numbness.  How is this diagnosed? This condition is diagnosed with a blood test. How is this treated? Hypokalemia can be treated by taking potassium supplements by mouth or adjusting the medicines that you take. Treatment may also include eating more foods that contain a lot of potassium. If your potassium level is very low, you may need to get potassium through an IV tube in one of your veins and be monitored in the hospital. Follow these instructions at home:  Take over-the-counter and prescription medicines only as told by your health care provider. This includes vitamins and supplements.  Eat a healthy diet. A healthy diet includes fresh fruits and vegetables, whole grains, healthy fats, and lean proteins.  If instructed, eat more foods that contain a lot of potassium, such  as: ? Nuts, such as peanuts and pistachios. ? Seeds, such as sunflower seeds and pumpkin seeds. ? Peas, lentils, and lima beans. ? Whole grain and bran cereals and breads. ? Fresh fruits and vegetables, such as apricots, avocado, bananas, cantaloupe, kiwi, oranges, tomatoes, asparagus, and potatoes. ? Orange juice. ? Tomato juice. ? Red meats. ? Yogurt.  Keep all follow-up visits as told by your health care provider. This is important. Contact a health care provider if:  You have weakness that gets worse.  You feel your heart pounding or racing.  You vomit.  You have diarrhea.  You have diabetes (diabetes mellitus) and you have trouble keeping your blood sugar (glucose) in your target range. Get help right away if:  You have chest pain.  You have shortness of breath.  You have vomiting or diarrhea that lasts for more than 2 days.  You faint. This information is not intended to replace advice given to you by your health care provider. Make sure you discuss any questions you have with your health care provider. Document Released: 11/04/2005 Document Revised: 06/22/2016 Document Reviewed: 06/22/2016 Elsevier Interactive Patient Education  2018 [employer]. Fever, Adult A fever is an increase in the body's temperature. It is usually defined as a temperature of 100F (38C) or higher. Brief mild or moderate fevers generally have no long-term effects, and they often do not require treatment. Moderate or high fevers may make you feel uncomfortable and can sometimes be a sign of a serious illness or disease. The sweating that may occur with repeated or prolonged fever may also cause dehydration. Fever is confirmed by taking a temperature  with a thermometer. A measured temperature can vary with:  Age.  Time of day.  Location of the thermometer: ? Mouth (oral). ? Rectum (rectal). ? Ear (tympanic). ? Underarm (axillary). ? Forehead (temporal).  Follow these instructions at  home: Pay attention to any changes in your symptoms. Take these actions to help with your condition:  Take over-the counter and prescription medicines only as told by your health care provider. Follow the dosing instructions carefully.  If you were prescribed an antibiotic medicine, take it as told by your health care provider. Do not stop taking the antibiotic even if you start to feel better.  Rest as needed.  Drink enough fluid to keep your urine clear or pale yellow. This helps to prevent dehydration.  Sponge yourself or bathe with room-temperature water to help reduce your body temperature as needed. Do not use ice water.  Do not overbundle yourself in blankets or heavy clothes.  Contact a health care provider if:  You vomit.  You cannot eat or drink without vomiting.  You have diarrhea.  You have pain when you urinate.  Your symptoms do not improve with treatment.  You develop new symptoms.  You develop excessive weakness. Get help right away if:  You have shortness of breath or have trouble breathing.  You are dizzy or you faint.  You are disoriented or confused.  You develop signs of dehydration, such as a dry mouth, decreased urination, or paleness.  You develop severe pain in your abdomen.  You have persistent vomiting or diarrhea.  You develop a skin rash.  Your symptoms suddenly get worse. This information is not intended to replace advice given to you by your health care provider. Make sure you discuss any questions you have with your health care provider. Document Released: 04/30/2001 Document Revised: 04/11/2016 Document Reviewed: 12/29/2014 Elsevier Interactive Patient Education  [employer].

## 2018-03-10 ENCOUNTER — Ambulatory Visit (INDEPENDENT_AMBULATORY_CARE_PROVIDER_SITE_OTHER): Payer: 59 | Admitting: General Surgery

## 2018-03-10 ENCOUNTER — Ambulatory Visit: Payer: Self-pay

## 2018-03-10 ENCOUNTER — Encounter: Payer: Self-pay | Admitting: General Surgery

## 2018-03-10 VITALS — BP 110/62 | HR 95 | Temp 99.2°F | Resp 16 | Ht 65.0 in | Wt 151.0 lb

## 2018-03-10 DIAGNOSIS — C50411 Malignant neoplasm of upper-outer quadrant of right female breast: Secondary | ICD-10-CM

## 2018-03-10 DIAGNOSIS — R2231 Localized swelling, mass and lump, right upper limb: Secondary | ICD-10-CM

## 2018-03-10 DIAGNOSIS — D0512 Intraductal carcinoma in situ of left breast: Secondary | ICD-10-CM | POA: Diagnosis not present

## 2018-03-10 LAB — URINE CULTURE

## 2018-03-10 NOTE — Progress Notes (Signed)
Patient ID: Megan Vega, female   DOB: 11-Nov-1956, 62 y.o.   MRN: [id number]  Chief Complaint  Patient presents with  . Other    HPI Megan Vega is a 62 y.o. female.  Here for evaluation of a possible right axillary abscess. She states she noticed a right axillary knot in the shower on Sunday morning. She has taken 2 doses of Bactrim from  She stats she has had fever since last Tuesday and has been talking with the [facility]. She is currently receiving chemotherapy.  HPI  Past Medical History:  Diagnosis Date  . Cancer ([place])    Basal Cell Carcinoma, about 2011  . Cataract   . Family history of adverse reaction to anesthesia    DAUGHTER-N/V  . Hemorrhoids   . History of methicillin resistant staphylococcus aureus (MRSA) 2015  . Migraines    MIGRAINES    Past Surgical History:  Procedure Laterality Date  . BREAST BIOPSY Bilateral 10/29/2017   L breast DCIS, R breast invasive mammary carcinoma of no special type, ER/PR+, Her2/neu 1+  . CESAREAN SECTION    . COLONOSCOPY  2012  . MANDIBLE FRACTURE SURGERY    . PORTACATH PLACEMENT Left 11/28/2017   Procedure: INSERTION PORT-A-CATH;  Surgeon: Robert Bellow, MD;  Location: [facility] ORS;  Service: General;  Laterality: Left;    Family History  Problem Relation Age of Onset  . Cancer Mother        Esophageal Cancer  . Early death Mother        Age 68  . Cancer Father        Lung Cancer  . Diabetes Father   . Diabetes Brother        Controlled with diet  . Heart attack Paternal Grandfather     Social History Social History   Tobacco Use  . Smoking status: Former Smoker    Packs/day: 0.25    Years: 10.00    Pack years: 2.50    Last attempt to quit: 1978    Years since quitting: 41.3  . Smokeless tobacco: Never Used  Substance Use Topics  . Alcohol use: Yes    Alcohol/week: 0.6 oz    Types: 1 Glasses of wine per week    Comment: RARE  . Drug use: No    Allergies  Allergen Reactions  .  Peanut-Containing Drug Products Swelling and Other (See Comments)    Only when eating too many peanuts, swelling with lips and tingly face    Current Outpatient Medications  Medication Sig Dispense Refill  . acetaminophen (TYLENOL) 500 MG tablet Take 1,000 mg by mouth every 6 (six) hours as needed for moderate pain or headache.    . b complex vitamins tablet Take 1 tablet by mouth daily.    Marland Kitchen dexamethasone (DECADRON) 4 MG tablet Take 2 tablets by mouth once a day on the day after chemotherapy and then take 2 tablets two times a day for 2 days. Take with food. 30 tablet 1  . lidocaine-prilocaine (EMLA) cream Apply to affected area once 30 g 3  . loratadine (CLARITIN) 10 MG tablet Take 10 mg by mouth daily.    Marland Kitchen LORazepam (ATIVAN) 0.5 MG tablet Take 1 tablet (0.5 mg total) by mouth every 6 (six) hours as needed (Nausea or vomiting). 30 tablet 0  . Multiple Vitamins-Minerals (HAIR/SKIN/NAILS/BIOTIN PO) Take 1 tablet by mouth daily.     . ondansetron (ZOFRAN) 8 MG tablet Take 1 tablet (8 mg total) by mouth  2 (two) times daily as needed. Start on the third day after chemotherapy. 30 tablet 1  . polyethylene glycol (MIRALAX / GLYCOLAX) packet Take 17 g by mouth daily as needed for mild constipation.    . potassium chloride SA (K-DUR,KLOR-CON) 20 MEQ tablet Take 1 tablet (20 mEq total) by mouth 2 (two) times daily. 14 tablet 0  . sulfamethoxazole-trimethoprim (BACTRIM DS,SEPTRA DS) 800-160 MG tablet Take 1 tablet by mouth 2 (two) times daily. 14 tablet 0   No current facility-administered medications for this visit.    Facility-Administered Medications Ordered in Other Visits  Medication Dose Route Frequency Provider Last Rate Last Dose  . heparin lock flush 100 unit/mL  500 Units Intracatheter PRN Lequita Asal, MD        Review of Systems Review of Systems  Constitutional: Negative.   Respiratory: Negative.   Cardiovascular: Negative.     Blood pressure 110/62, pulse 95, temperature  99.2 F (37.3 C), temperature source Oral, resp. rate 16, height _0  (1.651 m), weight 151 lb (68.5 kg), SpO2 96 %.  Physical Exam Physical Exam  Constitutional: She is oriented to person, place, and time. She appears well-developed and well-nourished.  HENT:  Mouth/Throat: Oropharynx is clear and moist.  Eyes: Conjunctivae are normal. No scleral icterus.  Neck: Neck supple.  Cardiovascular: Normal rate, regular rhythm and normal heart sounds.  Pulmonary/Chest: Effort normal and breath sounds normal. Right breast exhibits no inverted nipple, no mass, no nipple discharge, no skin change and no tenderness. Left breast exhibits no inverted nipple, no mass, no nipple discharge, no skin change and no tenderness.    Lymphadenopathy:    She has no cervical adenopathy.    She has axillary adenopathy.  3-4 cm fullness apex of right axilla  Neurological: She is alert and oriented to person, place, and time.  Skin: Skin is warm and dry.  Psychiatric: Her behavior is normal.    Data Reviewed February 18, 2018 ultrasound completed to assess progress with neoadjuvant chemotherapy showed no abnormality at the apex of the axilla and a significant decrease in the axillary lymphadenopathy.  Chest x-ray obtained March 05, 2018 was negative.  Blood cultures completed March 08, 2018 no growth at 24 hours.  CBC completed yesterday showed modest anemia with a hemoglobin of 11, MCV of 86, white blood cell count of 8700 with 78% polys , 8% lymphocytes, platelet count of 286,000.  Minimal change in WBC, actual slight improvement in neutrophil count and less left shift over the last 11 days.  Ultrasound of the axilla was completed today showing an ill-defined heterogeneous mass near the apex of the axilla measuring 3.2 x 3.4 x 3.6 cm.  The patient was amenable to aspiration and this was completed after ChloraPrep, 1 cc of 1% plain Xylocaine.  22-gauge needle used and about 5 cc of creamy white fluid obtained.   Sample sent for culture.  Procedure was well-tolerated.  Assessment    Unusual presentation of an axillary mass and fluid suggestive of either tumor necrosis, with none evident in the area 3 weeks ago or idiopathic staph infection.      Plan    The patient reports feeling better since beginning her antibiotics yesterday, and profile appears to be trending down.  Prior to considering operative intervention we will continue to maintain her present antibiotic therapy, have her make use of local heat and have a phone follow-up in 48 hours with her temperature course.  If it is trending down we  will continue her antibiotics, and if not we will consider operative intervention.   The patient is aware to use a heating pad as needed for comfort. Call Thursday with phone follow up regarding status and temperatures.     HPI, Physical Exam, Assessment and Plan have been scribed under the direction and in the presence of Robert Bellow, MD. Karie Fetch, RN  I have completed the exam and reviewed the above documentation for accuracy and completeness.  I agree with the above.  Haematologist has been used and any errors in dictation or transcription are unintentional.  Hervey Ard, M.D., F.A.C.S.  Forest Gleason Terecia Plaut 03/10/2018, 8:21 PM

## 2018-03-10 NOTE — Patient Instructions (Addendum)
The patient is aware to call back for any questions or new concerns.  The patient is aware to use a heating pad as needed for comfort. Call Thursday with phone follow up regarding status and temperatures.

## 2018-03-11 ENCOUNTER — Telehealth: Payer: Self-pay | Admitting: *Deleted

## 2018-03-11 DIAGNOSIS — E876 Hypokalemia: Secondary | ICD-10-CM | POA: Insufficient documentation

## 2018-03-11 NOTE — Telephone Encounter (Signed)
Left VM message for patient to call me back regarding her fever and antibiotics.     dhs

## 2018-03-12 ENCOUNTER — Encounter
Admission: RE | Disposition: A | Discharge status: Home / Self Care | Payer: Self-pay | Source: Ambulatory Visit | Attending: General Surgery

## 2018-03-12 ENCOUNTER — Ambulatory Visit: Payer: 59 | Admitting: Anesthesiology

## 2018-03-12 ENCOUNTER — Other Ambulatory Visit: Payer: Self-pay

## 2018-03-12 ENCOUNTER — Telehealth: Payer: Self-pay | Admitting: *Deleted

## 2018-03-12 ENCOUNTER — Encounter: Payer: Self-pay | Admitting: Anesthesiology

## 2018-03-12 ENCOUNTER — Ambulatory Visit (INDEPENDENT_AMBULATORY_CARE_PROVIDER_SITE_OTHER): Payer: 59 | Admitting: General Surgery

## 2018-03-12 ENCOUNTER — Other Ambulatory Visit: Payer: Self-pay | Admitting: *Deleted

## 2018-03-12 ENCOUNTER — Ambulatory Visit
Admission: RE | Admit: 2018-03-12 | Discharge: 2018-03-12 | Disposition: A | Discharge status: Home / Self Care | Payer: 59 | Source: Ambulatory Visit | Attending: General Surgery | Admitting: General Surgery

## 2018-03-12 DIAGNOSIS — L02411 Cutaneous abscess of right axilla: Secondary | ICD-10-CM | POA: Diagnosis not present

## 2018-03-12 DIAGNOSIS — Z79899 Other long term (current) drug therapy: Secondary | ICD-10-CM | POA: Insufficient documentation

## 2018-03-12 DIAGNOSIS — I739 Peripheral vascular disease, unspecified: Secondary | ICD-10-CM | POA: Insufficient documentation

## 2018-03-12 DIAGNOSIS — Z85828 Personal history of other malignant neoplasm of skin: Secondary | ICD-10-CM | POA: Diagnosis not present

## 2018-03-12 DIAGNOSIS — L03113 Cellulitis of right upper limb: Secondary | ICD-10-CM

## 2018-03-12 DIAGNOSIS — R2231 Localized swelling, mass and lump, right upper limb: Secondary | ICD-10-CM

## 2018-03-12 DIAGNOSIS — Z87891 Personal history of nicotine dependence: Secondary | ICD-10-CM | POA: Diagnosis not present

## 2018-03-12 HISTORY — PX: IRRIGATION AND DEBRIDEMENT ABSCESS: SHX5252

## 2018-03-12 HISTORY — DX: Other fracture of unspecified lower leg, initial encounter for closed fracture: S82.899A

## 2018-03-12 LAB — POCT I-STAT 4, (NA,K, GLUC, HGB,HCT)
Glucose, Bld: 133 mg/dL — ABNORMAL HIGH (ref 65–99)
HCT: 30 % — ABNORMAL LOW (ref 36.0–46.0)
Hemoglobin: 10.2 g/dL — ABNORMAL LOW (ref 12.0–15.0)
Potassium: 4.2 mmol/L (ref 3.5–5.1)
SODIUM: 133 mmol/L — AB (ref 135–145)

## 2018-03-12 LAB — SURGICAL PCR SCREEN
MRSA, PCR: NEGATIVE
Staphylococcus aureus: NEGATIVE

## 2018-03-12 SURGERY — IRRIGATION AND DEBRIDEMENT ABSCESS
Anesthesia: General | Site: Axilla | Laterality: Right | Wound class: Dirty or Infected

## 2018-03-12 MED ORDER — MIDAZOLAM HCL 5 MG/5ML IJ SOLN
INTRAMUSCULAR | Status: DC | PRN
  Administered 2018-03-12: 2 mg via INTRAVENOUS

## 2018-03-12 MED ORDER — SODIUM BICARBONATE 4 % IV SOLN
INTRAVENOUS | Status: AC
Start: 2018-03-12 — End: 2018-03-12
  Filled 2018-03-12: qty 5

## 2018-03-12 MED ORDER — PROPOFOL 10 MG/ML IV BOLUS
INTRAVENOUS | Status: AC
  Filled 2018-03-12: qty 20

## 2018-03-12 MED ORDER — LACTATED RINGERS IV SOLN
INTRAVENOUS | Status: DC
  Administered 2018-03-12: 16:00:00 via INTRAVENOUS

## 2018-03-12 MED ORDER — BUPIVACAINE-EPINEPHRINE (PF) 0.5% -1:200000 IJ SOLN
INTRAMUSCULAR | Status: DC | PRN
  Administered 2018-03-12: 20 mL via PERINEURAL

## 2018-03-12 MED ORDER — DEXAMETHASONE SODIUM PHOSPHATE 10 MG/ML IJ SOLN
INTRAMUSCULAR | Status: AC
  Filled 2018-03-12: qty 1

## 2018-03-12 MED ORDER — KETOROLAC TROMETHAMINE 30 MG/ML IJ SOLN
INTRAMUSCULAR | Status: AC
  Filled 2018-03-12: qty 1

## 2018-03-12 MED ORDER — KETOROLAC TROMETHAMINE 30 MG/ML IJ SOLN
INTRAMUSCULAR | Status: DC | PRN
  Administered 2018-03-12: 30 mg via INTRAVENOUS

## 2018-03-12 MED ORDER — BUPIVACAINE-EPINEPHRINE (PF) 0.5% -1:200000 IJ SOLN
INTRAMUSCULAR | Status: AC
  Filled 2018-03-12: qty 30

## 2018-03-12 MED ORDER — CEFAZOLIN SODIUM-DEXTROSE 2-4 GM/100ML-% IV SOLN
2.0000 g | INTRAVENOUS | Status: AC
  Administered 2018-03-12: 2 g via INTRAVENOUS

## 2018-03-12 MED ORDER — ONDANSETRON HCL 4 MG/2ML IJ SOLN: 4.0000 mg | Freq: Once | INTRAMUSCULAR | Status: DC | PRN

## 2018-03-12 MED ORDER — DEXAMETHASONE SODIUM PHOSPHATE 10 MG/ML IJ SOLN
INTRAMUSCULAR | Status: DC | PRN
  Administered 2018-03-12: 5 mg via INTRAVENOUS

## 2018-03-12 MED ORDER — LIDOCAINE HCL (CARDIAC) PF 100 MG/5ML IV SOSY
PREFILLED_SYRINGE | INTRAVENOUS | Status: DC | PRN
  Administered 2018-03-12: 60 mg via INTRAVENOUS

## 2018-03-12 MED ORDER — FENTANYL CITRATE (PF) 100 MCG/2ML IJ SOLN: 25.0000 ug | INTRAMUSCULAR | Status: DC | PRN

## 2018-03-12 MED ORDER — CEFAZOLIN SODIUM-DEXTROSE 2-4 GM/100ML-% IV SOLN
INTRAVENOUS | Status: AC
  Filled 2018-03-12: qty 100

## 2018-03-12 MED ORDER — PROPOFOL 10 MG/ML IV BOLUS
INTRAVENOUS | Status: DC | PRN
  Administered 2018-03-12: 150 mg via INTRAVENOUS

## 2018-03-12 MED ORDER — ACETAMINOPHEN 10 MG/ML IV SOLN
INTRAVENOUS | Status: DC | PRN
Start: 2018-03-12 — End: 2018-03-12
  Administered 2018-03-12: 1000 mg via INTRAVENOUS

## 2018-03-12 MED ORDER — ACETAMINOPHEN 10 MG/ML IV SOLN
INTRAVENOUS | Status: AC
  Filled 2018-03-12: qty 100

## 2018-03-12 MED ORDER — FAMOTIDINE 20 MG PO TABS
ORAL_TABLET | ORAL | Status: AC
  Filled 2018-03-12: qty 1

## 2018-03-12 MED ORDER — ONDANSETRON HCL 4 MG/2ML IJ SOLN
INTRAMUSCULAR | Status: DC | PRN
  Administered 2018-03-12: 4 mg via INTRAVENOUS

## 2018-03-12 MED ORDER — MIDAZOLAM HCL 2 MG/2ML IJ SOLN
INTRAMUSCULAR | Status: AC
  Filled 2018-03-12: qty 2

## 2018-03-12 MED ORDER — LIDOCAINE HCL (PF) 1 % IJ SOLN
INTRAMUSCULAR | Status: AC
  Filled 2018-03-12: qty 30

## 2018-03-12 MED ORDER — OXYCODONE HCL 5 MG PO TABS: 5.0000 mg | ORAL_TABLET | ORAL | 0 refills | Status: DC | PRN

## 2018-03-12 MED ORDER — ONDANSETRON HCL 4 MG/2ML IJ SOLN
INTRAMUSCULAR | Status: AC
  Filled 2018-03-12: qty 2

## 2018-03-12 MED ORDER — FENTANYL CITRATE (PF) 100 MCG/2ML IJ SOLN
INTRAMUSCULAR | Status: AC
  Filled 2018-03-12: qty 2

## 2018-03-12 MED ORDER — BUPIVACAINE HCL (PF) 0.5 % IJ SOLN
INTRAMUSCULAR | Status: AC
  Filled 2018-03-12: qty 30

## 2018-03-12 MED ORDER — FAMOTIDINE 20 MG PO TABS
20.0000 mg | ORAL_TABLET | Freq: Once | ORAL | Status: AC
  Administered 2018-03-12: 20 mg via ORAL

## 2018-03-12 MED ORDER — FENTANYL CITRATE (PF) 100 MCG/2ML IJ SOLN
INTRAMUSCULAR | Status: DC | PRN
  Administered 2018-03-12 (×4): 25 ug via INTRAVENOUS

## 2018-03-12 SURGICAL SUPPLY — 37 items
BANDAGE ELASTIC 6 LF NS (GAUZE/BANDAGES/DRESSINGS) ×2 IMPLANT
BNDG CMPR MED 5X6 ELC HKLP NS (GAUZE/BANDAGES/DRESSINGS) ×1
BNDG GAUZE 4.5X4.1 6PLY STRL (MISCELLANEOUS) ×2 IMPLANT
CANISTER SUCT 1200ML W/VALVE (MISCELLANEOUS) ×2 IMPLANT
CATH ROBINSON RED A/P 18FR (CATHETERS) ×1 IMPLANT
CHLORAPREP W/TINT 26ML (MISCELLANEOUS) ×2 IMPLANT
DRAIN PENROSE 5/8X18 LTX STRL (WOUND CARE) ×1 IMPLANT
DRAPE LAPAROTOMY 100X77 ABD (DRAPES) ×2 IMPLANT
DRSG TELFA 4X3 1S NADH ST (GAUZE/BANDAGES/DRESSINGS) ×3 IMPLANT
ELECT CAUTERY BLADE TIP 2.5 (TIP) ×2
ELECT REM PT RETURN 9FT ADLT (ELECTROSURGICAL) ×2
ELECTRODE CAUTERY BLDE TIP 2.5 (TIP) ×1 IMPLANT
ELECTRODE REM PT RTRN 9FT ADLT (ELECTROSURGICAL) ×1 IMPLANT
GAUZE SPONGE 4X4 12PLY STRL (GAUZE/BANDAGES/DRESSINGS) ×1 IMPLANT
GLOVE BIO SURGEON STRL SZ7.5 (GLOVE) ×2 IMPLANT
GLOVE INDICATOR 8.0 STRL GRN (GLOVE) ×2 IMPLANT
GOWN STRL REUS W/ TWL LRG LVL3 (GOWN DISPOSABLE) ×2 IMPLANT
GOWN STRL REUS W/TWL LRG LVL3 (GOWN DISPOSABLE) ×4
KIT TURNOVER KIT A (KITS) ×2 IMPLANT
LABEL OR SOLS (LABEL) ×2 IMPLANT
NDL HYPO 25X1 1.5 SAFETY (NEEDLE) ×1 IMPLANT
NEEDLE HYPO 22GX1.5 SAFETY (NEEDLE) ×2 IMPLANT
NEEDLE HYPO 25X1 1.5 SAFETY (NEEDLE) ×2 IMPLANT
NS IRRIG 1000ML POUR BTL (IV SOLUTION) ×2 IMPLANT
PACK BASIN MINOR ARMC (MISCELLANEOUS) ×2 IMPLANT
PAD TELFA 2X3 NADH STRL (GAUZE/BANDAGES/DRESSINGS) ×1 IMPLANT
PIN SAFETY STRL (MISCELLANEOUS) ×1 IMPLANT
STRIP CLOSURE SKIN 1/2X4 (GAUZE/BANDAGES/DRESSINGS) ×2 IMPLANT
SUT ETHILON 3-0 FS-10 30 BLK (SUTURE) ×2
SUT VIC AB 2-0 CT1 27 (SUTURE) ×2
SUT VIC AB 2-0 CT1 TAPERPNT 27 (SUTURE) ×1 IMPLANT
SUT VIC AB 2-0 CT2 27 (SUTURE) ×4 IMPLANT
SUT VIC AB 4-0 FS2 27 (SUTURE) ×2 IMPLANT
SUTURE EHLN 3-0 FS-10 30 BLK (SUTURE) IMPLANT
SWAB CULTURE AMIES ANAERIB BLU (MISCELLANEOUS) ×2 IMPLANT
SWABSTK COMLB BENZOIN TINCTURE (MISCELLANEOUS) ×2 IMPLANT
SYR 10ML LL (SYRINGE) ×2 IMPLANT

## 2018-03-12 NOTE — Anesthesia Post-op Follow-up Note (Signed)
Anesthesia QCDR form completed.        

## 2018-03-12 NOTE — Progress Notes (Addendum)
Megan Vega [id number] 1955-12-01     HPI: The patient was well until 9 days ago when she began to have fever and chills and subsequently was evaluated for a right axillary fullness 3 days ago.  At that time there was a ill-defined fullness in the axilla and a follow-up exam for 24 hours later showed a suspected right axillary abscess measuring 3.2 x 3.6 cm.  FNA was completed yielding about 5 cc of creamy material, culture pending.  The patient was started on Bactrim DS.  She is kept track of her temperatures over the last 48 hours with persistent peaks at 101.3.  No chills.  Based on her temperature course, CT scan was obtained earlier today and she was asked to present for reassessment.   (Not in a hospital admission) Allergies  Allergen Reactions  . Peanut-Containing Drug Products Swelling and Other (See Comments)    Only when eating too many peanuts, swelling with lips and tingly face   Past Medical History:  Diagnosis Date  . Cancer ([place])    Basal Cell Carcinoma, about 2011  . Cataract   . Family history of adverse reaction to anesthesia    DAUGHTER-N/V  . Hemorrhoids   . History of methicillin resistant staphylococcus aureus (MRSA) 2015  . Migraines    MIGRAINES   Past Surgical History:  Procedure Laterality Date  . BREAST BIOPSY Bilateral 10/29/2017   L breast DCIS, R breast invasive mammary carcinoma of no special type, ER/PR+, Her2/neu 1+  . CESAREAN SECTION    . COLONOSCOPY  2012  . MANDIBLE FRACTURE SURGERY    . PORTACATH PLACEMENT Left 11/28/2017   Procedure: INSERTION PORT-A-CATH;  Surgeon: Robert Bellow, MD;  Location: [facility] ORS;  Service: General;  Laterality: Left;   Social History   Socioeconomic History  . Marital status: Divorced    Spouse name: Not on file  . Number of children: Not on file  . Years of education: Not on file  . Highest education level: Not on file  Occupational History  . Not on file  Social Needs  . Financial resource  strain: Not on file  . Food insecurity:    Worry: Not on file    Inability: Not on file  . Transportation needs:    Medical: Not on file    Non-medical: Not on file  Tobacco Use  . Smoking status: Former Smoker    Packs/day: 0.25    Years: 10.00    Pack years: 2.50    Last attempt to quit: 1978    Years since quitting: 41.3  . Smokeless tobacco: Never Used  Substance and Sexual Activity  . Alcohol use: Yes    Alcohol/week: 0.6 oz    Types: 1 Glasses of wine per week    Comment: RARE  . Drug use: No  . Sexual activity: Not Currently  Lifestyle  . Physical activity:    Days per week: Not on file    Minutes per session: Not on file  . Stress: Not on file  Relationships  . Social connections:    Talks on phone: Not on file    Gets together: Not on file    Attends religious service: Not on file    Active member of club or organization: Not on file    Attends meetings of clubs or organizations: Not on file    Relationship status: Not on file  . Intimate partner violence:    Fear of current or ex partner: Not on  file    Emotionally abused: Not on file    Physically abused: Not on file    Forced sexual activity: Not on file  Other Topics Concern  . 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    Lives at home (son 87 yo lives with her)   Children 2   Pets: 3 small dogs and 1 frog    Caffeine- 1 cup of coffee in the morning, rare tea     ROS: Negative.     PE: HEENT: Negative. Lungs: Clear. Cardio: RR. Axilla: There is increased fullness in the apex of the right axilla with overlying erythema, new from exam 48 hours ago.  Imaging:  Chest CT obtained at [facility] earlier this morning describes a  4.9 x 6.0 cm complex fluid collection versus inflammatory lymph nodes in the axilla.  Adjacent soft tissue swelling.  Assessment/Plan:  Proceed with incision and drainage of RIGHT axillary abscess/necrotic lymph node.  Forest Gleason [facility] 03/12/2018

## 2018-03-12 NOTE — Transfer of Care (Signed)
Immediate Anesthesia Transfer of Care Note  Patient: Megan Vega  Procedure(s) Performed: IRRIGATION AND DEBRIDEMENT RIGHT AXILLARY ABSCESS (Right Axilla)  Patient Location: PACU  Anesthesia Type:General  Level of Consciousness: sedated  Airway & Oxygen Therapy: Patient Spontanous Breathing and Patient connected to face mask oxygen  Post-op Assessment: Report given to RN and Post -op Vital signs reviewed and stable  Post vital signs: Reviewed and stable  Last Vitals:  Vitals Value Taken Time  BP 88/46 03/12/2018  6:13 PM  Temp    Pulse 107 03/12/2018  6:16 PM  Resp 21 03/12/2018  6:16 PM  SpO2 97 % 03/12/2018  6:16 PM  Vitals shown include unvalidated device data.  Last Pain:  Vitals:   03/12/18 1605  TempSrc: Oral  PainSc: 0-No pain         Complications: No apparent anesthesia complications

## 2018-03-12 NOTE — Anesthesia Preprocedure Evaluation (Addendum)
Anesthesia Evaluation  Patient identified by MRN, date of birth, ID band Patient awake    Reviewed: Allergy & Precautions, NPO status , Patient's Chart, lab work & pertinent test results, reviewed documented beta blocker date and time   Airway Mallampati: II  TM Distance: >3 FB     Dental  (+) Chipped   Pulmonary former smoker,           Cardiovascular + Peripheral Vascular Disease       Neuro/Psych  Headaches,    GI/Hepatic   Endo/Other    Renal/GU      Musculoskeletal   Abdominal   Peds  Hematology   Anesthesia Other Findings   Reproductive/Obstetrics                             Anesthesia Physical Anesthesia Plan  ASA: II  Anesthesia Plan: General   Post-op Pain Management:    Induction: Intravenous  PONV Risk Score and Plan:   Airway Management Planned: LMA  Additional Equipment:   Intra-op Plan:   Post-operative Plan:   Informed Consent: I have reviewed the patients History and Physical, chart, labs and discussed the procedure including the risks, benefits and alternatives for the proposed anesthesia with the patient or authorized representative who has indicated his/her understanding and acceptance.     Plan Discussed with: CRNA  Anesthesia Plan Comments:        Anesthesia Quick Evaluation

## 2018-03-12 NOTE — Telephone Encounter (Signed)
Patient called this morning with a progress report. She was seen by Dr. Bary Castilla in the office on Tuesday, 03-10-18.  The patient states the heating pad has helped and the lump is not as big as it was on Tuesday.  She reports that her fever keeps coming back. Patient has checked her temperature with readings as follows:   03-10-18 at 4:30 pm- Temperature 101 degrees-took tylenol  03-10-18 at 6:00 pm-Temperature 99 degrees  03-10-18 at 8:30 pm- Temperature 99.4 degrees   03-11-18 at 5:45 am-Temperature 98.3 degrees  03-11-18 at 8 am- Temperature 100.1 degrees-took tylenol  03-11-18 at 1:35 pm-Temperature 100.3 degrees  03-11-18 at 3 pm- Temperature 101.3 degrees-took tylenol  03-11-18 at 8 pm-Temperature 101.3 degrees   03-12-18 at 5:30 am-Temperature 101.8 degrees-took a cool shower and tylenol  03-12-18 at 7:13 am-Temperature 100.0 degrees  Cell number is the best number to reach patient at today, [phone].

## 2018-03-12 NOTE — Anesthesia Procedure Notes (Signed)
Procedure Name: LMA Insertion Date/Time: 03/12/2018 5:41 PM Performed by: Dionne Bucy, CRNA Pre-anesthesia Checklist: Patient identified, Patient being monitored, Timeout performed, Emergency Drugs available and Suction available Patient Re-evaluated:Patient Re-evaluated prior to induction Oxygen Delivery Method: Circle system utilized Preoxygenation: Pre-oxygenation with 100% oxygen Induction Type: IV induction Ventilation: Mask ventilation without difficulty LMA: LMA inserted LMA Size: 4.0 Tube type: Oral Number of attempts: 1 Placement Confirmation: positive ETCO2 and breath sounds checked- equal and bilateral Tube secured with: Tape Dental Injury: Teeth and Oropharynx as per pre-operative assessment

## 2018-03-12 NOTE — Anesthesia Postprocedure Evaluation (Signed)
Anesthesia Post Note  Patient: Megan Vega  Procedure(s) Performed: IRRIGATION AND DEBRIDEMENT RIGHT AXILLARY ABSCESS (Right Axilla)  Patient location during evaluation: PACU Anesthesia Type: General Level of consciousness: awake and alert Pain management: pain level controlled Vital Signs Assessment: post-procedure vital signs reviewed and stable Respiratory status: spontaneous breathing, nonlabored ventilation, respiratory function stable and patient connected to nasal cannula oxygen Cardiovascular status: blood pressure returned to baseline and stable Postop Assessment: no apparent nausea or vomiting Anesthetic complications: no     Last Vitals:  Vitals:   03/12/18 1855 03/12/18 1900  BP: 108/64   Pulse: (!) 108 (!) 110  Resp: (!) 35 (!) 31  Temp:  37.7 C  SpO2: 92% 92%    Last Pain:  Vitals:   03/12/18 1825  TempSrc:   PainSc: 0-No pain                 Aldona Bryner S

## 2018-03-12 NOTE — Progress Notes (Signed)
Patient has been scheduled for a CT chest with contrast STAT for today, 03-12-18 at 11:15 am at [facility] ([place] [address]). *Location due to insurance requirements for approval.  Prep: none.   Patient aware to arrive at 11 am and take photo ID, insurance card, and card for port-a-cath. She is also aware to bring Dr. Bary Castilla a copy of the disc for review this afternoon.

## 2018-03-12 NOTE — Op Note (Signed)
Preoperative diagnosis: Right axillary abscess.  Postoperative diagnosis: Same.  Operative procedure: Incision and drainage right axillary abscess.  Operating Surgeon: Hervey Ard, MD.  Anesthesia: General by LMA, Marcaine 0.5% with 1 to 200,000 units of epinephrine, 20 cc.  Estimated blood loss: 5 cc.  Clinical note: This 62 year old woman is had a 10-day course of intermittent fevers and recent note Hayden Pedro of a right axillary mass.  Aspiration 48 hours ago showed some creamy fluid with culture pending.  Follow-up exam today with persistent fever showed an increase in size the mass to develop with redness.  CT scan showed a up to 6 cm area of complex cystic mass versus abscess versus necrotic lymph node.  She is brought to the operating room for incision and drainage.  The patient received Kefzol prior to the procedure.  Operative note: With the patient under adequate general anesthesia the area was cleansed with ChloraPrep and draped.  Marcaine was infiltrated for postoperative analgesia.  A 3 cm incision was made over the most fluctuant area.  The skin was incised sharply and hemostasis achieved with electrocautery.  Approximately 2 cm below the skin the abscess cavity was entered and approximately 150 cc of creamy odorless fluid was obtained.  Digital exam was used to break up loculations.  The area was then irrigated with 400 cc of saline.  A 1 inch Penrose drain was placed into the depths of the wound and anchored into position with 3-0 nylon sutures and a safety pin.  Telfa and gauze dressing applied.  Patient tolerated the procedure well and was taken to recovery room in stable condition.

## 2018-03-13 ENCOUNTER — Inpatient Hospital Stay: Payer: 59

## 2018-03-13 ENCOUNTER — Inpatient Hospital Stay: Payer: 59 | Admitting: Hematology and Oncology

## 2018-03-13 ENCOUNTER — Encounter: Payer: Self-pay | Admitting: General Surgery

## 2018-03-13 ENCOUNTER — Telehealth: Payer: Self-pay | Admitting: *Deleted

## 2018-03-13 ENCOUNTER — Ambulatory Visit: Payer: 59 | Admitting: General Surgery

## 2018-03-13 DIAGNOSIS — L02419 Cutaneous abscess of limb, unspecified: Secondary | ICD-10-CM | POA: Insufficient documentation

## 2018-03-13 NOTE — Progress Notes (Signed)
The patient is seen in follow-up after undergoing incision and drainage of her right axillary abscess yesterday evening.  She is been afebrile since discharge from the hospital, although she has been somewhat clammy.  No pain.  She is accompanied by her daughter.  Temperature this morning 97.4, blood pressure 110/58, pulse 90.  Is the first time her pulse is been below 100 in the last week.  Examination shows the gauze dressing placed at the time of surgery to be softly saturated without drainage under the bandage.  No odor.  There is a persistent to 8 cm swelling in the axilla likely related to the drainage procedure.  No bleeding noted.  No erythema.  Original culture sample from April 23 is showing heavy growth of an unidentified organism.  Gram stain from yesterday's sample shows mainly polymorphonuclear cells and gram-positive cocci.  This would be consistent with staph.  A new dressing was applied and the drain was shortened 1 inch.  The patient's daughter was instructed to change the dressing when saturated and that the patient may begin showering tomorrow.  She can make use of local heat over the bandage.  She will continue on Bactrim DS 1 p.o. twice daily.  We will arrange for a follow-up visit on March 16, 2018.  Dr. Mike Gip has been notified of the results of last night and will put her chemotherapy plan for today on hold for at least 1 week.

## 2018-03-13 NOTE — Telephone Encounter (Signed)
Received a forwarded call this morning stamped 806 AM 03/13/18 from D Marlou Starks message was from [facility] op nurse reporting that patient had been having fever all week until yesterday and was wandering if she needed to come in for her chemotherapy appointment today at 8:15. This message was not received by this writer until after her appointment time and I see that the patient did not come in for her appointment this morning.

## 2018-03-13 NOTE — Progress Notes (Deleted)
[facility] day:  03/13/2018   Chief Complaint: Megan Vega is a 62 y.o. female with stage IIIB right breast cancer and DCIS of the left breast who is seen for assessment prior to week #2 Taxol.  HPI: The patient was last seen in the medical oncology clinic on 02/20/2018.  At that time, she felt great.  Exam was stable.  WBC was 7700 with an [place] of 6100.  Hemoglobin was 11.9, hematocrit 33.0, and platelets 319,000.  She received week #2 Taxol.  She received week #3 Taxol on 02/27/2018.  She was seen by Beckey Rutter, NP on 03/05/2018 and 03/09/2018 with fever 101.4 - 102.4.  Iniitial work-up including a flu swab, throat culture, and CXR were negative.  On 03/09/2018 she presented with a new right axillary mass.  Exam revealed a new 3-4 cm mass.  She was started on Septra.  She was seen by Dr. Bary Castilla.  Right axillary ultrasound on 03/10/2018 revealed an ill-defined heterogeneous mass near the apex of the axilla measuring 3.2 x 3.4 x 3.6 cm.  The mass was aspirated and yielded 5 cc creamy material..    Chest CT at [facility] on 03/12/2018 revealed a 4.9 x 6.0 cm complex fluid collection versus inflammatory lymph nodes in the axillae with swelling.  She underwent incision and drainage of the right axillary mass on 03/12/2018.  Approximately 150 cc of creamy odorless fluid was obtained.  Loculations were broken up.  A drain was placed.  Gram stain revealed abundant WBC and few GPC.  Culture is negative to date.  Symptomatically,    Past Medical History:  Diagnosis Date  . Ankle fracture   . Cancer [facility])    Basal Cell Carcinoma, about 2011 chest  . Cataract   . Family history of adverse reaction to anesthesia    DAUGHTER-N/V  . Hemorrhoids   . History of methicillin resistant staphylococcus aureus (MRSA) 2015  . Migraines    MIGRAINES    Past Surgical History:  Procedure Laterality Date  . BREAST BIOPSY Bilateral 10/29/2017   L  breast DCIS, R breast invasive mammary carcinoma of no special type, ER/PR+, Her2/neu 1+  . CESAREAN SECTION    . COLONOSCOPY  2012  . MANDIBLE FRACTURE SURGERY    . PORTACATH PLACEMENT Left 11/28/2017   Procedure: INSERTION PORT-A-CATH;  Surgeon: Robert Bellow, MD;  Location: [facility] ORS;  Service: General;  Laterality: Left;    Family History  Problem Relation Age of Onset  . Cancer Mother        Esophageal Cancer  . Early death Mother        Age 61  . Cancer Father        Lung Cancer  . Diabetes Father   . Diabetes Brother        Controlled with diet  . Heart attack Paternal Grandfather     Social History:  reports that she quit smoking about 41 years ago. She has a 2.50 pack-year smoking history. She has never used smokeless tobacco. She reports that she drinks about 0.6 oz of alcohol per week. She reports that she does not use drugs.  Patient is a former smoker for 20 years; only smoked "about 3 cigarettes a day"; stopped 30 years ago. Patient drinks alcohol very infrequently; "about 1 glass a month". Patient currently employed as an Web designer at [employer] She denies any known exposures to radiations or toxins.  She lives in  [place].  Her grand daughter is graduating in 03/2018.  She has one daughter Megan Vega).  She is alone today.  Allergies:  Allergies  Allergen Reactions  . Peanut-Containing Drug Products Swelling and Other (See Comments)    Only when eating too many peanuts, swelling with lips and tingly face    Current Medications: Current Outpatient Medications  Medication Sig Dispense Refill  . acetaminophen (TYLENOL) 500 MG tablet Take 1,000 mg by mouth every 6 (six) hours as needed for moderate pain or headache.    . b complex vitamins tablet Take 1 tablet by mouth daily.    Marland Kitchen dexamethasone (DECADRON) 4 MG tablet Take 2 tablets by mouth once a day on the day after chemotherapy and then take 2 tablets two times a day for 2 days. Take  with food. 30 tablet 1  . lidocaine-prilocaine (EMLA) cream Apply to affected area once 30 g 3  . loratadine (CLARITIN) 10 MG tablet Take 10 mg by mouth daily.    Marland Kitchen LORazepam (ATIVAN) 0.5 MG tablet Take 1 tablet (0.5 mg total) by mouth every 6 (six) hours as needed (Nausea or vomiting). 30 tablet 0  . Multiple Vitamins-Minerals (HAIR/SKIN/NAILS/BIOTIN PO) Take 1 tablet by mouth daily.     . ondansetron (ZOFRAN) 8 MG tablet Take 1 tablet (8 mg total) by mouth 2 (two) times daily as needed. Start on the third day after chemotherapy. 30 tablet 1  . oxyCODONE (OXY IR/ROXICODONE) 5 MG immediate release tablet Take 1 tablet (5 mg total) by mouth every 4 (four) hours as needed for severe pain. 15 tablet 0  . polyethylene glycol (MIRALAX / GLYCOLAX) packet Take 17 g by mouth daily as needed for mild constipation.    . potassium chloride SA (K-DUR,KLOR-CON) 20 MEQ tablet Take 1 tablet (20 mEq total) by mouth 2 (two) times daily. 14 tablet 0  . sulfamethoxazole-trimethoprim (BACTRIM DS,SEPTRA DS) 800-160 MG tablet Take 1 tablet by mouth 2 (two) times daily. 14 tablet 0   No current facility-administered medications for this visit.    Facility-Administered Medications Ordered in Other Visits  Medication Dose Route Frequency Provider Last Rate Last Dose  . heparin lock flush 100 unit/mL  500 Units Intracatheter PRN Lequita Asal, MD        Review of Systems:  GENERAL:  Lots of energy.  No fevers, chills or sweats. Weight stable.  PERFORMANCE STATUS (ECOG):  1 HEENT:  Mucositis, resolved. No visual changes, runny nose, sore throat. Lungs: No shortness of breath or cough.  No hemoptysis. Cardiac:  No chest pain, palpitations, orthopnea, or PND. GI:  Mild constipation; improved with Miralax. No nausea, vomiting, diarrhea, melena or hematochezia.  GU:  No urgency, frequency, dysuria, or hematuria. Musculoskeletal:  No back pain.  No joint pain.  No muscle tenderness. Extremities:  No pain or  swelling. Skin:  No rashes or skin changes. Neuro:  h/o migraine headaches.  Essential tremors.  "Tingling" in hands; stable. No numbness or weakness, balance or coordination issues. Endocrine:  No diabetes, thyroid issues, hot flashes or night sweats. Psych:  No mood changes, depression or anxiety. Pain:  No focal pain.  Review of systems:  All other systems reviewed and found to be negative.  Physical Exam: There were no vitals taken for this visit. GENERAL:  Well developed, well nourished, woman sitting comfortably in the exam room in no acute distress. MENTAL STATUS:  Alert and oriented to person, place and time. HEAD:  Short blonde wig.  Normocephalic, atraumatic,  face symmetric, no Cushingoid features. EYES:  Dark green eyes.  Pupils equal round and reactive to light and accomodation.  No conjunctivitis or scleral icterus. ENT:  Oropharynx clear without lesion.  Tongue normal. Mucous membranes moist.  RESPIRATORY:  Clear to auscultation without rales, wheezes or rhonchi. CARDIOVASCULAR:  Regular rate and rhythm without murmur, rub or gallop. CHEST:  Left sided port-a-cath accessed.  ABDOMEN:  Soft, non-tender, with active bowel sounds, and no hepatosplenomegaly.  No masses. SKIN:  No rashes, ulcers or lesions. EXTREMITIES: No edema, no skin discoloration or tenderness.  No palpable cords. LYMPH NODES:  No palpable cervical, supraclavicular, axillary or inguinal adenopathy  NEUROLOGICAL: Approprite. PSYCH:  Appropriate.   Results for orders placed or performed during the hospital encounter of 03/12/18 (from the past 24 hour(s))  I-STAT 4, (NA,K, GLUC, HGB,HCT)     Status: Abnormal   Collection Time: 03/12/18  4:34 PM  Result Value Ref Range   Sodium 133 (L) 135 - 145 mmol/L   Potassium 4.2 3.5 - 5.1 mmol/L   Glucose, Bld 133 (H) 65 - 99 mg/dL   HCT 30.0 (L) 36.0 - 46.0 %   Hemoglobin 10.2 (L) 12.0 - 15.0 g/dL  Surgical pcr screen     Status: None   Collection Time: 03/12/18   4:50 PM  Result Value Ref Range   MRSA, PCR NEGATIVE NEGATIVE   Staphylococcus aureus NEGATIVE NEGATIVE  Aerobic/Anaerobic Culture (surgical/deep wound)     Status: None (Preliminary result)   Collection Time: 03/12/18  5:54 PM  Result Value Ref Range   Specimen Description      ABSCESS RIGHT AXILLA Performed at [facility], [address], [place], [place] [address]    Special Requests      NONE Performed at [facility], [place], [place] [address]    Gram Stain      ABUNDANT WBC PRESENT, PREDOMINANTLY PMN FEW GRAM POSITIVE COCCI Performed at [facility], [place] [address] [place], [place] [address]    Culture PENDING    Report Status PENDING      Assessment:  Jizelle Conkey is a 62 y.o. female with clinical stage [id number] right breast cancer and DCIS in the left breast s/p ultrasound guided biopsy on 10/29/2017. Pathology from the right breast at the 10 o'clock position 3 cm from the nipple revealed a 1.4 cm grade II invasive mammary carcinoma of no special type.  Tumor was ER + (> 90%), PR + (90%) and Her2/neu 1+.  Right axillary node biopsy revealed metastatic carcinoma.  Left breast biopsy at the 2 o'clock position 3 cm from the nipple revealed high grade DCIS with comedonecrosis.  CA27.29 was 23.5 on 11/14/2017.  Bilateral diagnostic mammogram and ultrasound on 10/23/2017 revealed a 3.9 x 1.7 x 2.2 cm irregular hypoechoic mass with associated areas of vascularity at the 10 o'clock position 3 cm from the nipple and a 1.1 x 0.5 x 0.4 cm area of nodularity (3 approximately 3 mm nodules at 10 o'clock 6 cm from the nipple in the right breast. The mass was within a 5 cm area of pleomorphic microcalcifications.  Ultrasound revealed at least 5 prominent lymph nodes with diffuse thickened and hypoechoic cortices suggestive of metastatic disease.  In the left breast, there was an irregular 1.6 x 1.3 x 1.2 cm hypoechoic mass with indistinct margins at 2  o'clock 3 cm from the nipple.  Axillary ultrasound revealed normal nodes.  Bilateral breast MRI on 11/17/2017  revealed a 4.3 x 4.8 x 5 cm mass on the right centered in the upper outer quadrant but also extends into the lower outer and upper inner quadrants.  There was a small amount of enhancement posterior to the right nipple which could represent subtle extension. There was a cluster of nodules located posterior to the superior aspect of the known malignancy and another small nodule located posterior to the inferior aspect of the malignancy which could represent satellite lesions. Taking the enhancement posterior to the right nipple and the potential posterior satellite lesions into account, the AP dimension could be as large as 7 cm.  The patient's known DCIS spans 5 x 5 x 4.4 cm mammographically based on calcifications. The associated enhancement spans 3.2 x 3.2 x 3.3 cm. However, there were 2 small linearly enhancing regions at 6 o'clock in the left breast which could represent further disease.  There was extensive adenopathy in the right axilla and right retropectoral region. There was at least 1 significantly enlarged right internal mammary lymph node.  PET scan on 11/24/2017 revealed hypermetabolic right axillary (2.6 x 3.1 cm; SUV 8.0), subpectoral/internal mammary (7 mm; SUV 2.4) adenopathy.  There was a 4 mm right lower lobe nodule is too small for PET resolution.  There was cholelithiasis.  She received 4 cycles of AC (12/05/2017 - 01/30/2018) with OnPro Neulasta support.  She is s/p 3 weeks of Taxol (02/13/2018 - 02/27/2018).  Breast ultrasound on 02/18/2018 revealed a 1.0 x 1.36 x 2.14 cm dominant mass in the upper outer quadrant of the right breast (>50% volume decrease).  Axillary lymph nodes showed marked improvement with only 2 of  5 previous identified  lymph nodes identifiable (largest 1.3 cm; smaller node with cortical thickness 0.39 compared to 0.99).  Bone density on 12/16/2016  revealed osteopenia with a T-score of -1.6 in the left femoral neck on 12/16/2017.  She developed a right axillary abscess and underwent incision and drainage on 03/12/2018.  Symptomatically, she feels great.  Exam stable.  WBC is 7700 with an [place] of 6100.  Hemoglobin is 11.9, hematocrit 33.0, and platelets 319,000.  Plan: 1.  Labs today:  CBC with diff, CMP, Mg. 2.  Review interval events.  Postpone chemotherapy. 3.   2.  Review interval ultrasound- improvement. 3.  Labs reviewed. Blood counts stable and adequate for treatment. Proceed with week #2 Taxol today as planned.  4.  RTC weekly x 2 for labs (CBC with diff, CMP) and Taxol. 5.  RTC in 3 weeks for MD assessment, labs (CBC with diff, CMP, Mg), and week #5 Taxol.     Nolon Stalls, MD 03/13/2018, 2:38 AM   I saw and evaluated the patient, participating in the key portions of the service and reviewing pertinent diagnostic studies and records.  I reviewed the nurse practitioner's note and agree with the findings and the plan.  The assessment and plan were discussed with the patient.  Additional diagnostic studies of *** are needed to clarify *** and would change the clinical management.  A few ***multiple questions were asked by the patient and answered.   Nolon Stalls, MD 03/13/2018,2:38 AM

## 2018-03-13 NOTE — Telephone Encounter (Signed)
  I spoke to Dr. Bary Castilla who was seeing her this morning.  He relayed the message.  She will need to be scheduled for next week (MD, labs, +/- Taxol).  M

## 2018-03-14 LAB — CULTURE, BLOOD (ROUTINE X 2)
Culture: NO GROWTH
Culture: NO GROWTH
SPECIAL REQUESTS: ADEQUATE
Special Requests: ADEQUATE

## 2018-03-15 LAB — ANAEROBIC AND AEROBIC CULTURE

## 2018-03-16 ENCOUNTER — Encounter: Payer: Self-pay | Admitting: General Surgery

## 2018-03-16 ENCOUNTER — Ambulatory Visit (INDEPENDENT_AMBULATORY_CARE_PROVIDER_SITE_OTHER): Payer: 59 | Admitting: General Surgery

## 2018-03-16 VITALS — BP 122/78 | HR 93 | Resp 12 | Ht 65.0 in | Wt 149.0 lb

## 2018-03-16 DIAGNOSIS — L02419 Cutaneous abscess of limb, unspecified: Secondary | ICD-10-CM

## 2018-03-16 MED ORDER — SULFAMETHOXAZOLE-TRIMETHOPRIM 800-160 MG PO TABS: 1.0000 | ORAL_TABLET | Freq: Two times a day (BID) | ORAL | 0 refills | Status: DC

## 2018-03-16 NOTE — Progress Notes (Signed)
Patient ID: Megan Vega, female   DOB: 1956-04-17, 62 y.o.   MRN: [id number]  Chief Complaint  Patient presents with  . Other    HPI Megan Vega is a 62 y.o. female here today for her follow up right axillary I &D done on 03/12/2018. Patient states the area is draining. No more fevers.  HPI  Past Medical History:  Diagnosis Date  . Ankle fracture   . Cancer [facility])    Basal Cell Carcinoma, about 2011 chest  . Cataract   . Family history of adverse reaction to anesthesia    DAUGHTER-N/V  . Hemorrhoids   . History of methicillin resistant staphylococcus aureus (MRSA) 2015  . Migraines    MIGRAINES    Past Surgical History:  Procedure Laterality Date  . BREAST BIOPSY Bilateral 10/29/2017   L breast DCIS, R breast invasive mammary carcinoma of no special type, ER/PR+, Her2/neu 1+  . CESAREAN SECTION    . COLONOSCOPY  2012  . IRRIGATION AND DEBRIDEMENT ABSCESS Right 03/12/2018   Procedure: IRRIGATION AND DEBRIDEMENT RIGHT AXILLARY ABSCESS;  Surgeon: Robert Bellow, MD;  Location: [facility] ORS;  Service: General;  Laterality: Right;  . MANDIBLE FRACTURE SURGERY    . PORTACATH PLACEMENT Left 11/28/2017   Procedure: INSERTION PORT-A-CATH;  Surgeon: Robert Bellow, MD;  Location: [facility] ORS;  Service: General;  Laterality: Left;    Family History  Problem Relation Age of Onset  . Cancer Mother        Esophageal Cancer  . Early death Mother        Age 34  . Cancer Father        Lung Cancer  . Diabetes Father   . Diabetes Brother        Controlled with diet  . Heart attack Paternal Grandfather     Social History Social History   Tobacco Use  . Smoking status: Former Smoker    Packs/day: 0.25    Years: 10.00    Pack years: 2.50    Last attempt to quit: 1978    Years since quitting: 41.3  . Smokeless tobacco: Never Used  Substance Use Topics  . Alcohol use: Yes    Alcohol/week: 0.6 oz    Types: 1 Glasses of wine per week    Comment: RARE  . Drug use: No     Allergies  Allergen Reactions  . Peanut-Containing Drug Products Swelling and Other (See Comments)    Only when eating too many peanuts, swelling with lips and tingly face    Current Outpatient Medications  Medication Sig Dispense Refill  . acetaminophen (TYLENOL) 500 MG tablet Take 1,000 mg by mouth every 6 (six) hours as needed for moderate pain or headache.    . b complex vitamins tablet Take 1 tablet by mouth daily.    Marland Kitchen dexamethasone (DECADRON) 4 MG tablet Take 2 tablets by mouth once a day on the day after chemotherapy and then take 2 tablets two times a day for 2 days. Take with food. 30 tablet 1  . lidocaine-prilocaine (EMLA) cream Apply to affected area once 30 g 3  . loratadine (CLARITIN) 10 MG tablet Take 10 mg by mouth daily.    Marland Kitchen LORazepam (ATIVAN) 0.5 MG tablet Take 1 tablet (0.5 mg total) by mouth every 6 (six) hours as needed (Nausea or vomiting). 30 tablet 0  . Multiple Vitamins-Minerals (HAIR/SKIN/NAILS/BIOTIN PO) Take 1 tablet by mouth daily.     . ondansetron (ZOFRAN) 8 MG tablet Take 1  tablet (8 mg total) by mouth 2 (two) times daily as needed. Start on the third day after chemotherapy. 30 tablet 1  . oxyCODONE (OXY IR/ROXICODONE) 5 MG immediate release tablet Take 1 tablet (5 mg total) by mouth every 4 (four) hours as needed for severe pain. 15 tablet 0  . polyethylene glycol (MIRALAX / GLYCOLAX) packet Take 17 g by mouth daily as needed for mild constipation.    . potassium chloride SA (K-DUR,KLOR-CON) 20 MEQ tablet Take 1 tablet (20 mEq total) by mouth 2 (two) times daily. 14 tablet 0  . sulfamethoxazole-trimethoprim (BACTRIM DS,SEPTRA DS) 800-160 MG tablet Take 1 tablet by mouth 2 (two) times daily. 14 tablet 0  . sulfamethoxazole-trimethoprim (BACTRIM DS,SEPTRA DS) 800-160 MG tablet Take 1 tablet by mouth 2 (two) times daily. 20 tablet 0   No current facility-administered medications for this visit.    Facility-Administered Medications Ordered in Other Visits   Medication Dose Route Frequency Provider Last Rate Last Dose  . heparin lock flush 100 unit/mL  500 Units Intracatheter PRN Lequita Asal, MD        Review of Systems Review of Systems  Constitutional: Negative.   Respiratory: Negative.   Cardiovascular: Negative.     Blood pressure 122/78, pulse 93, resp. rate 12, height 5' 5"  (1.651 m), weight 149 lb (67.6 kg).  Physical Exam Physical Exam  Constitutional: She appears well-developed and well-nourished.  Cardiovascular: Normal rate and regular rhythm.  Pulmonary/Chest: Effort normal and breath sounds normal.      Data Reviewed March 10, 2018 culture results: Staphylococcus aureusAbnormal     MICS are expressed in micrograms per mL   Antibiotic         RSLT#1  RSLT#2  RSLT#3  RSLT#4  Ciprofloxacin         R  Clindamycin          S  Erythromycin          R  Gentamicin           S  Levofloxacin          I  Linezolid           S  Moxifloxacin          I  Oxacillin           S  Penicillin           R  Quinupristin/Dalfopristin   S  Rifampin            S  Tetracycline          S  Trimethoprim/Sulfa       S  Vancomycin           S   There is some erythema superior to the incision site but no fluctuance or tenderness.  The cyst 5-6 cm mass notable at the time of her surgery 4 days ago is markedly decreased in size, at least 60% smaller.  The drain was shortened by 1 inch.  Assessment    Marked improvement in right axillary swelling and tenderness.  Good tolerance of Bactrim therapy.    Plan  Return to return in 3 days.   The patient will continue on Bactrim DS 1 p.o. twice daily for an additional 10 days.    The patient is aware to call back for any questions or concerns. The patient is aware to use a heating pad as needed for comfort.  HPI, Physical Exam,  Assessment and Plan have been scribed  under the direction and in the presence of Hervey Ard, MD.  Gaspar Cola, CMA   I have completed the exam and reviewed the above documentation for accuracy and completeness.  I agree with the above.  Haematologist has been used and any errors in dictation or transcription are unintentional.  Hervey Ard, M.D., F.A.C.S.  Megan Vega 03/16/2018, 9:04 PM

## 2018-03-16 NOTE — Patient Instructions (Addendum)
Return to return in four day . The patient is aware to call back for any questions or concerns. The patient is aware to use a heating pad as needed for comfort.

## 2018-03-18 LAB — AEROBIC/ANAEROBIC CULTURE W GRAM STAIN (SURGICAL/DEEP WOUND)

## 2018-03-18 LAB — AEROBIC/ANAEROBIC CULTURE (SURGICAL/DEEP WOUND)

## 2018-03-19 ENCOUNTER — Other Ambulatory Visit: Payer: Self-pay | Admitting: General Surgery

## 2018-03-19 ENCOUNTER — Ambulatory Visit (INDEPENDENT_AMBULATORY_CARE_PROVIDER_SITE_OTHER): Payer: 59 | Admitting: General Surgery

## 2018-03-19 ENCOUNTER — Encounter: Payer: Self-pay | Admitting: General Surgery

## 2018-03-19 VITALS — BP 126/78 | HR 94 | Temp 98.6°F | Resp 14 | Ht 65.0 in | Wt 150.0 lb

## 2018-03-19 DIAGNOSIS — L02419 Cutaneous abscess of limb, unspecified: Secondary | ICD-10-CM

## 2018-03-19 NOTE — Patient Instructions (Signed)
The patient is aware to call back for any questions or concerns.  

## 2018-03-19 NOTE — Progress Notes (Signed)
Patient ID: Megan Vega, female   DOB: [birth year], 62 y.o.   MRN: [id number]  Chief Complaint  Patient presents with  . Routine Post Op    HPI Megan Vega is a 62 y.o. female.  Here for postoperative visit, incision and debridement right axilla abscess on 03-12-18. She is feeling better, minimal drainage.  HPI  Past Medical History:  Diagnosis Date  . Ankle fracture   . Cancer [facility])    Basal Cell Carcinoma, about 2011 chest  . Cataract   . Family history of adverse reaction to anesthesia    DAUGHTER-N/V  . Hemorrhoids   . History of methicillin resistant staphylococcus aureus (MRSA) 2015  . Migraines    MIGRAINES    Past Surgical History:  Procedure Laterality Date  . BREAST BIOPSY Bilateral 10/29/2017   L breast DCIS, R breast invasive mammary carcinoma of no special type, ER/PR+, Her2/neu 1+  . CESAREAN SECTION    . COLONOSCOPY  2012  . IRRIGATION AND DEBRIDEMENT ABSCESS Right 03/12/2018   Procedure: IRRIGATION AND DEBRIDEMENT RIGHT AXILLARY ABSCESS;  Surgeon: Robert Bellow, MD;  Location: [facility] ORS;  Service: General;  Laterality: Right;  . MANDIBLE FRACTURE SURGERY    . PORTACATH PLACEMENT Left 11/28/2017   Procedure: INSERTION PORT-A-CATH;  Surgeon: Robert Bellow, MD;  Location: [facility] ORS;  Service: General;  Laterality: Left;    Family History  Problem Relation Age of Onset  . Cancer Mother        Esophageal Cancer  . Early death Mother        Age 39  . Cancer Father        Lung Cancer  . Diabetes Father   . Diabetes Brother        Controlled with diet  . Heart attack Paternal Grandfather     Social History Social History   Tobacco Use  . Smoking status: Former Smoker    Packs/day: 0.25    Years: 10.00    Pack years: 2.50    Last attempt to quit: 1978    Years since quitting: 41.3  . Smokeless tobacco: Never Used  Substance Use Topics  . Alcohol use: Yes    Alcohol/week: 0.6 oz    Types: 1 Glasses of wine per week    Comment: RARE   . Drug use: No    Allergies  Allergen Reactions  . Peanut-Containing Drug Products Swelling and Other (See Comments)    Only when eating too many peanuts, swelling with lips and tingly face    Current Outpatient Medications  Medication Sig Dispense Refill  . acetaminophen (TYLENOL) 500 MG tablet Take 1,000 mg by mouth every 6 (six) hours as needed for moderate pain or headache.    . b complex vitamins tablet Take 1 tablet by mouth daily.    Marland Kitchen dexamethasone (DECADRON) 4 MG tablet Take 2 tablets by mouth once a day on the day after chemotherapy and then take 2 tablets two times a day for 2 days. Take with food. 30 tablet 1  . lidocaine-prilocaine (EMLA) cream Apply to affected area once 30 g 3  . loratadine (CLARITIN) 10 MG tablet Take 10 mg by mouth daily.    Marland Kitchen LORazepam (ATIVAN) 0.5 MG tablet Take 1 tablet (0.5 mg total) by mouth every 6 (six) hours as needed (Nausea or vomiting). 30 tablet 0  . Multiple Vitamins-Minerals (HAIR/SKIN/NAILS/BIOTIN PO) Take 1 tablet by mouth daily.     . ondansetron (ZOFRAN) 8 MG tablet Take 1 tablet (  8 mg total) by mouth 2 (two) times daily as needed. Start on the third day after chemotherapy. 30 tablet 1  . oxyCODONE (OXY IR/ROXICODONE) 5 MG immediate release tablet Take 1 tablet (5 mg total) by mouth every 4 (four) hours as needed for severe pain. 15 tablet 0  . polyethylene glycol (MIRALAX / GLYCOLAX) packet Take 17 g by mouth daily as needed for mild constipation.    . potassium chloride SA (K-DUR,KLOR-CON) 20 MEQ tablet Take 1 tablet (20 mEq total) by mouth 2 (two) times daily. 14 tablet 0  . sulfamethoxazole-trimethoprim (BACTRIM DS,SEPTRA DS) 800-160 MG tablet Take 1 tablet by mouth 2 (two) times daily. 20 tablet 0   No current facility-administered medications for this visit.    Facility-Administered Medications Ordered in Other Visits  Medication Dose Route Frequency Provider Last Rate Last Dose  . heparin lock flush 100 unit/mL  500 Units  Intracatheter PRN Lequita Asal, MD        Review of Systems Review of Systems  Constitutional: Negative.   Respiratory: Negative.   Cardiovascular: Negative.     Blood pressure 126/78, pulse 94, temperature 98.6 F (37 C), temperature source Oral, resp. rate 14, height 5' 5"  (1.651 m), weight 150 lb (68 kg), SpO2 98 %.  Physical Exam Physical Exam  Constitutional: She is oriented to person, place, and time. She appears well-developed and well-nourished.  Pulmonary/Chest:    Lymphadenopathy:    She has no axillary adenopathy.  Right axillary drain removed  Neurological: She is alert and oriented to person, place, and time.  Skin: Skin is warm and dry.  Psychiatric: Her behavior is normal.    Data Reviewed Culture results again show staph sensitive to sulfa.  Assessment    Resolution of right axillary abscess, source unclear.    Plan    Patient will complete her present prescribed course of Bactrim DS.  We will plan for a follow-up in 1 week.  Anticipate she can resume neoadjuvant chemotherapy next week. Follow up in 1 week     HPI, Physical Exam, Assessment and Plan have been scribed under the direction and in the presence of Robert Bellow, MD. Karie Fetch, RN   I have completed the exam and reviewed the above documentation for accuracy and completeness.  I agree with the above.  Haematologist has been used and any errors in dictation or transcription are unintentional.  Megan Vega, M.D., F.A.C.S.   Forest Gleason Shasha Buchbinder 03/19/2018, 8:23 AM

## 2018-03-20 ENCOUNTER — Inpatient Hospital Stay: Payer: 59

## 2018-03-20 ENCOUNTER — Inpatient Hospital Stay: Payer: 59 | Admitting: Hematology and Oncology

## 2018-03-26 ENCOUNTER — Encounter: Payer: Self-pay | Admitting: General Surgery

## 2018-03-26 ENCOUNTER — Ambulatory Visit (INDEPENDENT_AMBULATORY_CARE_PROVIDER_SITE_OTHER): Payer: 59 | Admitting: General Surgery

## 2018-03-26 VITALS — BP 120/68 | HR 82 | Resp 12 | Ht 65.0 in | Wt 149.0 lb

## 2018-03-26 DIAGNOSIS — R2231 Localized swelling, mass and lump, right upper limb: Secondary | ICD-10-CM

## 2018-03-26 NOTE — Patient Instructions (Signed)
The patient is aware to call back for any questions or concerns.  

## 2018-03-26 NOTE — Progress Notes (Signed)
Patient ID: Megan Vega, female   DOB: 1956/08/06, 62 y.o.   MRN: [id number]  Chief Complaint  [name]    HPI Megan Vega is a 62 y.o. female here today for her follow up right axillary abscess done on 03/12/2018. Patient states she is doing well. Completed antibiotics today.  HPI  Past Medical History:  Diagnosis Date  . Ankle fracture   . Cancer [facility])    Basal Cell Carcinoma, about 2011 chest  . Cataract   . Family history of adverse reaction to anesthesia    DAUGHTER-N/V  . Hemorrhoids   . History of methicillin resistant staphylococcus aureus (MRSA) 2015  . Migraines    MIGRAINES    Past Surgical History:  Procedure Laterality Date  . BREAST BIOPSY Bilateral 10/29/2017   L breast DCIS, R breast invasive mammary carcinoma of no special type, ER/PR+, Her2/neu 1+  . CESAREAN SECTION    . COLONOSCOPY  2012  . IRRIGATION AND DEBRIDEMENT ABSCESS Right 03/12/2018   Procedure: IRRIGATION AND DEBRIDEMENT RIGHT AXILLARY ABSCESS;  Surgeon: Robert Bellow, MD;  Location: [facility] ORS;  Service: General;  Laterality: Right;  . MANDIBLE FRACTURE SURGERY    . PORTACATH PLACEMENT Left 11/28/2017   Procedure: INSERTION PORT-A-CATH;  Surgeon: Robert Bellow, MD;  Location: [facility] ORS;  Service: General;  Laterality: Left;    Family History  Problem Relation Age of Onset  . Cancer Mother        Esophageal Cancer  . Early death Mother        Age 74  . Cancer Father        Lung Cancer  . Diabetes Father   . Diabetes Brother        Controlled with diet  . Heart attack Paternal Grandfather     Social History Social History   Tobacco Use  . Smoking status: Former Smoker    Packs/day: 0.25    Years: 10.00    Pack years: 2.50    Last attempt to quit: 1978    Years since quitting: 41.3  . Smokeless tobacco: Never Used  Substance Use Topics  . Alcohol use: Yes    Alcohol/week: 0.6 oz    Types: 1 Glasses of wine per week    Comment: RARE  .  Drug use: No    Allergies  Allergen Reactions  . Peanut-Containing Drug Products Swelling and Other (See Comments)    Only when eating too many peanuts, swelling with lips and tingly face    Current Outpatient Medications  Medication Sig Dispense Refill  . acetaminophen (TYLENOL) 500 MG tablet Take 1,000 mg by mouth every 6 (six) hours as needed for moderate pain or headache.    . b complex vitamins tablet Take 1 tablet by mouth daily.    Marland Kitchen lidocaine-prilocaine (EMLA) cream Apply to affected area once 30 g 3  . loratadine (CLARITIN) 10 MG tablet Take 10 mg by mouth daily.    Marland Kitchen LORazepam (ATIVAN) 0.5 MG tablet Take 1 tablet (0.5 mg total) by mouth every 6 (six) hours as needed (Nausea or vomiting). 30 tablet 0  . Multiple Vitamins-Minerals (HAIR/SKIN/NAILS/BIOTIN PO) Take 1 tablet by mouth daily.     . ondansetron (ZOFRAN) 8 MG tablet Take 1 tablet (8 mg total) by mouth 2 (two) times daily as needed. Start on the third day after chemotherapy. 30 tablet 1  . polyethylene glycol (MIRALAX / GLYCOLAX) packet Take 17 g by mouth daily as needed for  mild constipation.    . potassium chloride SA (K-DUR,KLOR-CON) 20 MEQ tablet Take 1 tablet (20 mEq total) by mouth 2 (two) times daily. 14 tablet 0  . dexamethasone (DECADRON) 4 MG tablet Take 2 tablets by mouth once a day on the day after chemotherapy and then take 2 tablets two times a day for 2 days. Take with food. (Patient not taking: Reported on 03/26/2018) 30 tablet 1   No current facility-administered medications for this visit.    Facility-Administered Medications Ordered in Other Visits  Medication Dose Route Frequency Provider Last Rate Last Dose  . heparin lock flush 100 unit/mL  500 Units Intracatheter PRN Lequita Asal, MD        Review of Systems Review of Systems  Constitutional: Negative.   Respiratory: Negative.   Cardiovascular: Negative.     Blood pressure 120/68, pulse 82, resp. rate 12, height 5' 5"  (1.651 m), weight  149 lb (67.6 kg), SpO2 97 %.  Physical Exam Physical Exam  Constitutional: She is oriented to person, place, and time. She appears well-developed and well-nourished.  Pulmonary/Chest:    Musculoskeletal:  Excellent upper extremity range of motion  Lymphadenopathy:  Right axillary area much improved  Neurological: She is alert and oriented to person, place, and time.  Skin: Skin is warm and dry.  Psychiatric: Her behavior is normal.    Data Reviewed Culture showed MSSA.  Assessment    Doing well status post drainage of axillary abscess, etiology unclear.    Plan    May resume chemotherapy. Follow up based on planned chemotherapy regimen and a spot need for preoperative reassessment.   HPI, Physical Exam, Assessment and Plan have been scribed under the direction and in the presence of Robert Bellow, MD. Karie Fetch, RN  I have completed the exam and reviewed the above documentation for accuracy and completeness.  I agree with the above.  Haematologist has been used and any errors in dictation or transcription are unintentional.  Hervey Ard, M.D., F.A.C.S.  [name] 03/27/2018, 7:14 AM

## 2018-03-27 ENCOUNTER — Inpatient Hospital Stay (HOSPITAL_BASED_OUTPATIENT_CLINIC_OR_DEPARTMENT_OTHER): Payer: 59 | Admitting: Hematology and Oncology

## 2018-03-27 ENCOUNTER — Inpatient Hospital Stay: Payer: 59

## 2018-03-27 ENCOUNTER — Inpatient Hospital Stay: Payer: 59 | Attending: Hematology and Oncology

## 2018-03-27 VITALS — BP 103/70 | HR 86 | Temp 98.4°F | Resp 18 | Wt 148.5 lb

## 2018-03-27 DIAGNOSIS — D709 Neutropenia, unspecified: Secondary | ICD-10-CM | POA: Insufficient documentation

## 2018-03-27 DIAGNOSIS — C50811 Malignant neoplasm of overlapping sites of right female breast: Secondary | ICD-10-CM | POA: Insufficient documentation

## 2018-03-27 DIAGNOSIS — Z800 Family history of malignant neoplasm of digestive organs: Secondary | ICD-10-CM | POA: Diagnosis not present

## 2018-03-27 DIAGNOSIS — Z170 Estrogen receptor positive status [ER+]: Secondary | ICD-10-CM | POA: Diagnosis not present

## 2018-03-27 DIAGNOSIS — C773 Secondary and unspecified malignant neoplasm of axilla and upper limb lymph nodes: Secondary | ICD-10-CM | POA: Insufficient documentation

## 2018-03-27 DIAGNOSIS — Z79899 Other long term (current) drug therapy: Secondary | ICD-10-CM | POA: Diagnosis not present

## 2018-03-27 DIAGNOSIS — C50411 Malignant neoplasm of upper-outer quadrant of right female breast: Secondary | ICD-10-CM

## 2018-03-27 DIAGNOSIS — Z801 Family history of malignant neoplasm of trachea, bronchus and lung: Secondary | ICD-10-CM

## 2018-03-27 DIAGNOSIS — Z87891 Personal history of nicotine dependence: Secondary | ICD-10-CM

## 2018-03-27 DIAGNOSIS — D0512 Intraductal carcinoma in situ of left breast: Secondary | ICD-10-CM

## 2018-03-27 DIAGNOSIS — Z85828 Personal history of other malignant neoplasm of skin: Secondary | ICD-10-CM | POA: Diagnosis not present

## 2018-03-27 DIAGNOSIS — D702 Other drug-induced agranulocytosis: Secondary | ICD-10-CM

## 2018-03-27 DIAGNOSIS — L02419 Cutaneous abscess of limb, unspecified: Secondary | ICD-10-CM

## 2018-03-27 DIAGNOSIS — Z8614 Personal history of Methicillin resistant Staphylococcus aureus infection: Secondary | ICD-10-CM

## 2018-03-27 DIAGNOSIS — K8020 Calculus of gallbladder without cholecystitis without obstruction: Secondary | ICD-10-CM

## 2018-03-27 LAB — CBC WITH DIFFERENTIAL/PLATELET
Basophils Absolute: 0.1 10*3/uL (ref 0–0.1)
Basophils Relative: 4 %
Eosinophils Absolute: 0.1 10*3/uL (ref 0–0.7)
Eosinophils Relative: 3 %
HCT: 31.6 % — ABNORMAL LOW (ref 35.0–47.0)
Hemoglobin: 10.9 g/dL — ABNORMAL LOW (ref 12.0–16.0)
Lymphocytes Relative: 23 %
Lymphs Abs: 0.7 10*3/uL — ABNORMAL LOW (ref 1.0–3.6)
MCH: 30.1 pg (ref 26.0–34.0)
MCHC: 34.5 g/dL (ref 32.0–36.0)
MCV: 87.1 fL (ref 80.0–100.0)
Monocytes Absolute: 0.5 10*3/uL (ref 0.2–0.9)
Monocytes Relative: 17 %
Neutro Abs: 1.5 10*3/uL (ref 1.4–6.5)
Neutrophils Relative %: 53 %
Platelets: 262 10*3/uL (ref 150–440)
RBC: 3.63 MIL/uL — ABNORMAL LOW (ref 3.80–5.20)
RDW: 17.1 % — ABNORMAL HIGH (ref 11.5–14.5)
WBC: 2.9 10*3/uL — ABNORMAL LOW (ref 3.6–11.0)

## 2018-03-27 LAB — COMPREHENSIVE METABOLIC PANEL
ALT: 17 U/L (ref 14–54)
AST: 31 U/L (ref 15–41)
Albumin: 3.6 g/dL (ref 3.5–5.0)
Alkaline Phosphatase: 80 U/L (ref 38–126)
Anion gap: 10 (ref 5–15)
BUN: 11 mg/dL (ref 6–20)
CO2: 22 mmol/L (ref 22–32)
Calcium: 8.8 mg/dL — ABNORMAL LOW (ref 8.9–10.3)
Chloride: 105 mmol/L (ref 101–111)
Creatinine, Ser: 1.07 mg/dL — ABNORMAL HIGH (ref 0.44–1.00)
GFR calc Af Amer: >60 mL/min (ref >60)
GFR calc non Af Amer: 55 mL/min — ABNORMAL LOW (ref >60)
Glucose, Bld: 183 mg/dL — ABNORMAL HIGH (ref 65–99)
Potassium: 3.9 mmol/L (ref 3.5–5.1)
Sodium: 137 mmol/L (ref 135–145)
Total Bilirubin: 0.8 mg/dL (ref 0.3–1.2)
Total Protein: 6.7 g/dL (ref 6.5–8.1)

## 2018-03-27 LAB — MAGNESIUM: Magnesium: 1.7 mg/dL (ref 1.7–2.4)

## 2018-03-27 MED ORDER — SODIUM CHLORIDE 0.9% FLUSH
10.0000 mL | INTRAVENOUS | Status: DC | PRN
  Administered 2018-03-27: 10 mL via INTRAVENOUS
  Filled 2018-03-27: qty 10

## 2018-03-27 MED ORDER — HEPARIN SOD (PORK) LOCK FLUSH 100 UNIT/ML IV SOLN
500.0000 [IU] | Freq: Once | INTRAVENOUS | Status: AC
  Administered 2018-03-27: 500 [IU] via INTRAVENOUS

## 2018-03-27 NOTE — Progress Notes (Signed)
Patient offers no complaints today. 

## 2018-03-27 NOTE — Progress Notes (Signed)
[place] [facility] day:  03/27/2018   Chief Complaint: Megan Vega is a 62 y.o. female with stage IIIB right breast cancer and DCIS of the left breast who is seen for assessment prior to week #4 Taxol.  HPI: The patient was last seen in the medical oncology clinic on 02/20/2018.  At that time, she felt great.  Exam was stable.  WBC was 7700 with an [place] of 6100.  Hemoglobin was 11.9, hematocrit 33.0, and platelets 319,000.  She received week #2 Taxol.  She received week #3 Taxol on 02/27/2018.  She was seen by Beckey Rutter, NP on 03/05/2018 and 03/09/2018 with fever 101.4 - 102.4.  Iniitial work-up including a flu swab, throat culture, and CXR were negative.  On 03/09/2018 she presented with a new right axillary mass.  Exam revealed a new 3-4 cm mass.  She was started on Septra.  She was seen by Dr. Bary Castilla.  Right axillary ultrasound on 03/10/2018 revealed an ill-defined heterogeneous mass near the apex of the axilla measuring 3.2 x 3.4 x 3.6 cm.  The mass was aspirated and yielded 5 cc creamy material..    C[facility] at [facility] on 03/12/2018 revealed a 4.9 x 6.0 cm complex fluid collection versus inflammatory lymph nodes in the axillae with swelling.  She underwent incision and drainage of the right axillary mass on 03/12/2018.  Approximately 150 cc of creamy odorless fluid was obtained.  Loculations were broken up.  A drain was placed.  Gram stain revealed abundant WBC and few GPC.  Culture is negative to date.  She saw Dr. Bary Castilla on 03/19/2018.  She was feeling better with minimal drainage.  Culture grew staph sensitive to Sulfa.   Symptomatically, patient is doing well today. There are no acute concerns. Previously abscess to RIGHT axilla is healing well. She completed a 2 week course of Bactrim DS on 03/26/2018.  Patient denies B symptoms today. Patient is eating well. Weight has decreased by 1 pound. Patient denies pain in the clinic today.     Past Medical History:  Diagnosis Date  . Ankle fracture   . Cancer [facility])    Basal Cell Carcinoma, about 2011 chest  . Cataract   . Family history of adverse reaction to anesthesia    DAUGHTER-N/V  . Hemorrhoids   . History of methicillin resistant staphylococcus aureus (MRSA) 2015  . Migraines    MIGRAINES    Past Surgical History:  Procedure Laterality Date  . BREAST BIOPSY Bilateral 10/29/2017   L breast DCIS, R breast invasive mammary carcinoma of no special type, ER/PR+, Her2/neu 1+  . CESAREAN SECTION    . COLONOSCOPY  2012  . IRRIGATION AND DEBRIDEMENT ABSCESS Right 03/12/2018   Procedure: IRRIGATION AND DEBRIDEMENT RIGHT AXILLARY ABSCESS;  Surgeon: Robert Bellow, MD;  Location: [facility] ORS;  Service: General;  Laterality: Right;  . MANDIBLE FRACTURE SURGERY    . PORTACATH PLACEMENT Left 11/28/2017   Procedure: INSERTION PORT-A-CATH;  Surgeon: Robert Bellow, MD;  Location: [facility] ORS;  Service: General;  Laterality: Left;    Family History  Problem Relation Age of Onset  . Cancer Mother        Esophageal Cancer  . Early death Mother        Age 61  . Cancer Father        Lung Cancer  . Diabetes Father   . Diabetes Brother        Controlled with diet  .  Heart attack Paternal Grandfather     Social History:  reports that she quit smoking about 41 years ago. She has a 2.50 pack-year smoking history. She has never used smokeless tobacco. She reports that she drinks about 0.6 oz of alcohol per week. She reports that she does not use drugs.  Patient is a former smoker for 20 years; only smoked "about 3 cigarettes a day"; stopped 30 years ago. Patient drinks alcohol very infrequently; "about 1 glass a month". Patient currently employed as an Web designer at [employer] She denies any known exposures to radiations or toxins.  She lives in [place].  Her grand daughter is graduating in 03/2018.  She has one daughter Megan Vega).  She is alone  today.  Allergies:  Allergies  Allergen Reactions  . Peanut-Containing Drug Products Swelling and Other (See Comments)    Only when eating too many peanuts, swelling with lips and tingly face    Current Medications: Current Outpatient Medications  Medication Sig Dispense Refill  . acetaminophen (TYLENOL) 500 MG tablet Take 1,000 mg by mouth every 6 (six) hours as needed for moderate pain or headache.    . b complex vitamins tablet Take 1 tablet by mouth daily.    Marland Kitchen lidocaine-prilocaine (EMLA) cream Apply to affected area once 30 g 3  . loratadine (CLARITIN) 10 MG tablet Take 10 mg by mouth daily.    Marland Kitchen LORazepam (ATIVAN) 0.5 MG tablet Take 1 tablet (0.5 mg total) by mouth every 6 (six) hours as needed (Nausea or vomiting). 30 tablet 0  . Multiple Vitamins-Minerals (HAIR/SKIN/NAILS/BIOTIN PO) Take 1 tablet by mouth daily.     . ondansetron (ZOFRAN) 8 MG tablet Take 1 tablet (8 mg total) by mouth 2 (two) times daily as needed. Start on the third day after chemotherapy. 30 tablet 1  . polyethylene glycol (MIRALAX / GLYCOLAX) packet Take 17 g by mouth daily as needed for mild constipation.    . potassium chloride SA (K-DUR,KLOR-CON) 20 MEQ tablet Take 1 tablet (20 mEq total) by mouth 2 (two) times daily. 14 tablet 0  . dexamethasone (DECADRON) 4 MG tablet Take 2 tablets by mouth once a day on the day after chemotherapy and then take 2 tablets two times a day for 2 days. Take with food. (Patient not taking: Reported on 03/26/2018) 30 tablet 1   No current facility-administered medications for this visit.    Facility-Administered Medications Ordered in Other Visits  Medication Dose Route Frequency Provider Last Rate Last Dose  . heparin lock flush 100 unit/mL  500 Units Intracatheter PRN Corcoran, Melissa C, MD      . heparin lock flush 100 unit/mL  500 Units Intravenous Once Corcoran, Melissa C, MD      . sodium chloride flush (NS) 0.9 % injection 10 mL  10 mL Intravenous PRN Lequita Asal, MD   10 mL at 03/27/18 0815    Review of Systems  Constitutional: Positive for weight loss (1 pound). Negative for diaphoresis, fever and malaise/fatigue.  HENT: Negative.   Eyes: Negative.   Respiratory: Negative for cough, hemoptysis, sputum production and shortness of breath.   Cardiovascular: Negative for chest pain, palpitations, orthopnea, leg swelling and PND.  Gastrointestinal: Positive for constipation (improved on Miralax). Negative for abdominal pain, blood in stool, diarrhea, melena, nausea and vomiting.  Genitourinary: Negative for dysuria, frequency, hematuria and urgency.  Musculoskeletal: Negative for back pain, falls, joint pain and myalgias.  Skin: Negative for itching and rash.  Right axillae s/p post I&D; healing well  Neurological: Positive for tingling (hands - stable), tremors (essential) and headaches (migraines). Negative for dizziness and weakness.  Endo/Heme/Allergies: Does not bruise/bleed easily.  Psychiatric/Behavioral: Negative for depression, memory loss and suicidal ideas. The patient is not nervous/anxious and does not have insomnia.   All other systems reviewed and are negative.  Physical Exam: Blood pressure 103/70, pulse 86, temperature 98.4 F (36.9 C), temperature source Tympanic, resp. rate 18, weight 148 lb 8 oz (67.4 kg). GENERAL:  Well developed, well nourished, woman sitting comfortably in the exam room in no acute distress. MENTAL STATUS:  Alert and oriented to person, place and time. HEAD:  Short strawberry blonde wig.  Normocephalic, atraumatic, face symmetric, no Cushingoid features. EYES:  Dark green eyes.  Pupils equal round and reactive to light and accomodation.  No conjunctivitis or scleral icterus. ENT:  Oropharynx clear without lesion.  Tongue normal. Mucous membranes moist.  RESPIRATORY:  Clear to auscultation without rales, wheezes or rhonchi. CARDIOVASCULAR:  Regular rate and rhythm without murmur, rub or gallop. ABDOMEN:   Soft, non-tender, with active bowel sounds, and no hepatosplenomegaly.  No masses. SKIN:  Right axillae with a 3.5 x 3.0 cm firm area s/p I&D, slight warmth, no fluctance or redness.  No rashes, ulcers or lesions. EXTREMITIES: No edema, no skin discoloration or tenderness.  No palpable cords. LYMPH NODES: No palpable cervical, supraclavicular, axillary or inguinal adenopathy  NEUROLOGICAL: Unremarkable. PSYCH:  Appropriate.   No results found for this or any previous visit (from the past 24 hour(s)).   Assessment:  Ruchy Wildrick is a 62 y.o. female with clinical stage [id number] right breast cancer and DCIS in the left breast s/p ultrasound guided biopsy on 10/29/2017. Pathology from the right breast at the 10 o'clock position 3 cm from the nipple revealed a 1.4 cm grade II invasive mammary carcinoma of no special type.  Tumor was ER + (> 90%), PR + (90%) and Her2/neu 1+.  Right axillary node biopsy revealed metastatic carcinoma.  Left breast biopsy at the 2 o'clock position 3 cm from the nipple revealed high grade DCIS with comedonecrosis.  CA27.29 was 23.5 on 11/14/2017.  Bilateral diagnostic mammogram and ultrasound on 10/23/2017 revealed a 3.9 x 1.7 x 2.2 cm irregular hypoechoic mass with associated areas of vascularity at the 10 o'clock position 3 cm from the nipple and a 1.1 x 0.5 x 0.4 cm area of nodularity (3 approximately 3 mm nodules at 10 o'clock 6 cm from the nipple in the right breast. The mass was within a 5 cm area of pleomorphic microcalcifications.  Ultrasound revealed at least 5 prominent lymph nodes with diffuse thickened and hypoechoic cortices suggestive of metastatic disease.  In the left breast, there was an irregular 1.6 x 1.3 x 1.2 cm hypoechoic mass with indistinct margins at 2 o'clock 3 cm from the nipple.  Axillary ultrasound revealed normal nodes.  Bilateral breast MRI on 11/17/2017 revealed a 4.3 x 4.8 x 5 cm mass on the right centered in the upper outer quadrant but also  extends into the lower outer and upper inner quadrants.  There was a small amount of enhancement posterior to the right nipple which could represent subtle extension. There was a cluster of nodules located posterior to the superior aspect of the known malignancy and another small nodule located posterior to the inferior aspect of the malignancy which could represent satellite lesions. Taking the enhancement posterior to the right nipple and the potential posterior satellite  lesions into account, the AP dimension could be as large as 7 cm.  The patient's known DCIS spans 5 x 5 x 4.4 cm mammographically based on calcifications. The associated enhancement spans 3.2 x 3.2 x 3.3 cm. However, there were 2 small linearly enhancing regions at 6 o'clock in the left breast which could represent further disease.  There was extensive adenopathy in the right axilla and right retropectoral region. There was at least 1 significantly enlarged right internal mammary lymph node.  PET scan on 11/24/2017 revealed hypermetabolic right axillary (2.6 x 3.1 cm; SUV 8.0), subpectoral/internal mammary (7 mm; SUV 2.4) adenopathy.  There was a 4 mm right lower lobe nodule is too small for PET resolution.  There was cholelithiasis.  She received 4 cycles of AC (12/05/2017 - 01/30/2018) with OnPro Neulasta support.  She is s/p 3 weeks of Taxol (02/13/2018 - 02/27/2018).  Week #4 was delayed secondary to a right axillary abscess.  Breast ultrasound on 02/18/2018 revealed a 1.0 x 1.36 x 2.14 cm dominant mass in the upper outer quadrant of the right breast (>50% volume decrease).  Axillary lymph nodes showed marked improvement with only 2 of  5 previous identified  lymph nodes identifiable (largest 1.3 cm; smaller node with cortical thickness 0.39 compared to 0.99).  Bone density on 12/16/2016 revealed osteopenia with a T-score of -1.6 in the left femoral neck on 12/16/2017.  She developed a right axillary abscess and underwent incision  and drainage on 03/12/2018.  Culture grew staph aureus.  She was treated with Septra.  Symptomatically, she feels well. She has no acute concerns. Axillary abscess is healing well. She completed 2 week course of Bactrim DS on 03/26/2018.  Exam is stable.  WBC is low s/p Bactrim.  Plan: 1.  Labs today:  CBC with diff, CMP, Mg. 2.  Review interval events and management of right axillary abscess.   3.  Hold cycle #2 due to low counts felt due to Bactrim.  Anticipate counts to recover now off Bactrim and elevated monocyte count.  4.  RTC in 1 week for MD assessment, labs (CBC with diff, CMP, Mg), and week #4 Taxol.   Honor Loh, NP 03/27/2018, 9:19 AM  I saw and evaluated the patient, participating in the key portions of the service and reviewing pertinent diagnostic studies and records.  I reviewed the nurse practitioner's note and agree with the findings and the plan.  The assessment and plan were discussed with the patient. Multiple questions were asked by the patient and answered.   Nolon Stalls, MD 03/27/2018,9:19 AM

## 2018-03-29 ENCOUNTER — Encounter: Payer: Self-pay | Admitting: Hematology and Oncology

## 2018-03-29 DIAGNOSIS — D72819 Decreased white blood cell count, unspecified: Secondary | ICD-10-CM | POA: Insufficient documentation

## 2018-04-03 ENCOUNTER — Encounter: Payer: Self-pay | Admitting: Urgent Care

## 2018-04-03 ENCOUNTER — Inpatient Hospital Stay: Payer: 59

## 2018-04-03 ENCOUNTER — Other Ambulatory Visit: Payer: Self-pay | Admitting: Hematology and Oncology

## 2018-04-03 ENCOUNTER — Inpatient Hospital Stay (HOSPITAL_BASED_OUTPATIENT_CLINIC_OR_DEPARTMENT_OTHER): Payer: 59 | Admitting: Urgent Care

## 2018-04-03 VITALS — BP 120/82 | HR 74 | Temp 97.9°F | Resp 18 | Wt 149.6 lb

## 2018-04-03 DIAGNOSIS — Z800 Family history of malignant neoplasm of digestive organs: Secondary | ICD-10-CM | POA: Diagnosis not present

## 2018-04-03 DIAGNOSIS — D0512 Intraductal carcinoma in situ of left breast: Secondary | ICD-10-CM

## 2018-04-03 DIAGNOSIS — Z5111 Encounter for antineoplastic chemotherapy: Secondary | ICD-10-CM

## 2018-04-03 DIAGNOSIS — D709 Neutropenia, unspecified: Secondary | ICD-10-CM | POA: Diagnosis not present

## 2018-04-03 DIAGNOSIS — Z801 Family history of malignant neoplasm of trachea, bronchus and lung: Secondary | ICD-10-CM | POA: Diagnosis not present

## 2018-04-03 DIAGNOSIS — D702 Other drug-induced agranulocytosis: Secondary | ICD-10-CM

## 2018-04-03 DIAGNOSIS — Z79899 Other long term (current) drug therapy: Secondary | ICD-10-CM

## 2018-04-03 DIAGNOSIS — C50811 Malignant neoplasm of overlapping sites of right female breast: Secondary | ICD-10-CM | POA: Diagnosis not present

## 2018-04-03 DIAGNOSIS — Z85828 Personal history of other malignant neoplasm of skin: Secondary | ICD-10-CM

## 2018-04-03 DIAGNOSIS — Z87891 Personal history of nicotine dependence: Secondary | ICD-10-CM

## 2018-04-03 DIAGNOSIS — K8020 Calculus of gallbladder without cholecystitis without obstruction: Secondary | ICD-10-CM | POA: Diagnosis not present

## 2018-04-03 DIAGNOSIS — Z170 Estrogen receptor positive status [ER+]: Secondary | ICD-10-CM | POA: Diagnosis not present

## 2018-04-03 DIAGNOSIS — Z7189 Other specified counseling: Secondary | ICD-10-CM

## 2018-04-03 DIAGNOSIS — Z8614 Personal history of Methicillin resistant Staphylococcus aureus infection: Secondary | ICD-10-CM | POA: Diagnosis not present

## 2018-04-03 DIAGNOSIS — C50411 Malignant neoplasm of upper-outer quadrant of right female breast: Secondary | ICD-10-CM

## 2018-04-03 DIAGNOSIS — C773 Secondary and unspecified malignant neoplasm of axilla and upper limb lymph nodes: Secondary | ICD-10-CM | POA: Diagnosis not present

## 2018-04-03 LAB — CBC WITH DIFFERENTIAL/PLATELET
Basophils Absolute: 0.1 10*3/uL (ref 0–0.1)
Basophils Relative: 3 %
Eosinophils Absolute: 0.1 10*3/uL (ref 0–0.7)
Eosinophils Relative: 2 %
HCT: 34.7 % — ABNORMAL LOW (ref 35.0–47.0)
Hemoglobin: 12 g/dL (ref 12.0–16.0)
Lymphocytes Relative: 30 %
Lymphs Abs: 0.9 10*3/uL — ABNORMAL LOW (ref 1.0–3.6)
MCH: 30.1 pg (ref 26.0–34.0)
MCHC: 34.5 g/dL (ref 32.0–36.0)
MCV: 87.1 fL (ref 80.0–100.0)
Monocytes Absolute: 0.5 10*3/uL (ref 0.2–0.9)
Monocytes Relative: 17 %
Neutro Abs: 1.4 10*3/uL (ref 1.4–6.5)
Neutrophils Relative %: 48 %
Platelets: 241 10*3/uL (ref 150–440)
RBC: 3.98 MIL/uL (ref 3.80–5.20)
RDW: 17.3 % — ABNORMAL HIGH (ref 11.5–14.5)
WBC: 2.9 10*3/uL — ABNORMAL LOW (ref 3.6–11.0)

## 2018-04-03 LAB — COMPREHENSIVE METABOLIC PANEL
ALT: 19 U/L (ref 14–54)
AST: 26 U/L (ref 15–41)
Albumin: 3.9 g/dL (ref 3.5–5.0)
Alkaline Phosphatase: 71 U/L (ref 38–126)
Anion gap: 9 (ref 5–15)
BUN: 12 mg/dL (ref 6–20)
CO2: 23 mmol/L (ref 22–32)
Calcium: 9.2 mg/dL (ref 8.9–10.3)
Chloride: 105 mmol/L (ref 101–111)
Creatinine, Ser: 0.82 mg/dL (ref 0.44–1.00)
GFR calc Af Amer: >60 mL/min (ref >60)
GFR calc non Af Amer: >60 mL/min (ref >60)
Glucose, Bld: 97 mg/dL (ref 65–99)
Potassium: 3.9 mmol/L (ref 3.5–5.1)
Sodium: 137 mmol/L (ref 135–145)
Total Bilirubin: 0.8 mg/dL (ref 0.3–1.2)
Total Protein: 6.8 g/dL (ref 6.5–8.1)

## 2018-04-03 LAB — MAGNESIUM: Magnesium: 1.9 mg/dL (ref 1.7–2.4)

## 2018-04-03 MED ORDER — SODIUM CHLORIDE 0.9% FLUSH
10.0000 mL | INTRAVENOUS | Status: DC | PRN
  Administered 2018-04-03 (×2): 10 mL via INTRAVENOUS
  Filled 2018-04-03: qty 10

## 2018-04-03 MED ORDER — HEPARIN SOD (PORK) LOCK FLUSH 100 UNIT/ML IV SOLN
500.0000 [IU] | Freq: Once | INTRAVENOUS | Status: AC
  Administered 2018-04-03: 500 [IU] via INTRAVENOUS
  Filled 2018-04-03: qty 5

## 2018-04-03 NOTE — Progress Notes (Signed)
Patient offers no complaints today.  Neuropathy in fingers much improved.

## 2018-04-03 NOTE — Progress Notes (Signed)
[facility] day:  04/03/2018   Chief Complaint: Megan Vega is a 62 y.o. female with stage IIIB right breast cancer and DCIS of the left breast who is seen for assessment prior to week #4 Taxol.  HPI: The patient was last seen in the medical oncology clinic on 03/27/2018.  At that time, patient was doing well. She was s/p RIGHT axillary abscess I&D. She had been treated with a 2 week course of Bactrim. Denied fevers. Exam revealed healing I&D site with no signs of infection. Exam otherwise stable. WBC 2900 (ANC 1500). Week #4 Taxol was held.   In the interim, patient continues to do well. She notes that she has felt so much better with her time off from treatment. She denies nausea, vomiting, or changes to her bowel habits. Axillary abscess that was drained by Dr. Bary Castilla has fully, and uneventfully, healed. Patient has not experienced any fevers, sweats, or significant weight loss. She is eating well. Her weight has increased by 1 pound.   Patient notes that her neuropathy has increased somewhat, however doesn't last for long periods of time. She advises that on 04/02/2018, she was dropping things for a portion of the day. She denies other limitations in her function and dexterity. She is able to manipulate buttons without difficulties.   Patient does not verbalize any concerns with regards to her breasts today. Patient performs monthly self breast examinations as recommended. She denies pain in the clinic. Patient is eager to resume treatments.   Past Medical History:  Diagnosis Date  . Ankle fracture   . Cancer [facility])    Basal Cell Carcinoma, about 2011 chest  . Cataract   . Family history of adverse reaction to anesthesia    DAUGHTER-N/V  . Hemorrhoids   . History of methicillin resistant staphylococcus aureus (MRSA) 2015  . Migraines    MIGRAINES    Past Surgical History:  Procedure Laterality Date  . BREAST BIOPSY Bilateral 10/29/2017    L breast DCIS, R breast invasive mammary carcinoma of no special type, ER/PR+, Her2/neu 1+  . CESAREAN SECTION    . COLONOSCOPY  2012  . IRRIGATION AND DEBRIDEMENT ABSCESS Right 03/12/2018   Procedure: IRRIGATION AND DEBRIDEMENT RIGHT AXILLARY ABSCESS;  Surgeon: Robert Bellow, MD;  Location: [facility] ORS;  Service: General;  Laterality: Right;  . MANDIBLE FRACTURE SURGERY    . PORTACATH PLACEMENT Left 11/28/2017   Procedure: INSERTION PORT-A-CATH;  Surgeon: Robert Bellow, MD;  Location: [facility] ORS;  Service: General;  Laterality: Left;    Family History  Problem Relation Age of Onset  . Cancer Mother        Esophageal Cancer  . Early death Mother        Age 65  . Cancer Father        Lung Cancer  . Diabetes Father   . Diabetes Brother        Controlled with diet  . Heart attack Paternal Grandfather     Social History:  reports that she quit smoking about 41 years ago. She has a 2.50 pack-year smoking history. She has never used smokeless tobacco. She reports that she drinks about 0.6 oz of alcohol per week. She reports that she does not use drugs.  Patient is a former smoker for 20 years; only smoked "about 3 cigarettes a day"; stopped 30 years ago. Patient drinks alcohol very infrequently; "about 1 glass a month". 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 She denies any known exposures to radiations or toxins.  She lives in [place].  Her grand daughter is graduating in 03/2018.  She has one daughter [name]).  She is accompanied by her daughter, [name],  today.  Allergies:  Allergies  Allergen Reactions  . Peanut-Containing Drug Products Swelling and Other (See Comments)    Only when eating too many peanuts, swelling with lips and tingly face    Current Medications: Current Outpatient Medications  Medication Sig Dispense Refill  . acetaminophen (TYLENOL) 500 MG tablet Take 1,000 mg by mouth every 6 (six) hours as needed for  moderate pain or headache.    . b complex vitamins tablet Take 1 tablet by mouth daily.    Marland Kitchen lidocaine-prilocaine (EMLA) cream Apply to affected area once 30 g 3  . loratadine (CLARITIN) 10 MG tablet Take 10 mg by mouth daily.    Marland Kitchen LORazepam (ATIVAN) 0.5 MG tablet Take 1 tablet (0.5 mg total) by mouth every 6 (six) hours as needed (Nausea or vomiting). 30 tablet 0  . Multiple Vitamins-Minerals (HAIR/SKIN/NAILS/BIOTIN PO) Take 1 tablet by mouth daily.     . ondansetron (ZOFRAN) 8 MG tablet Take 1 tablet (8 mg total) by mouth 2 (two) times daily as needed. Start on the third day after chemotherapy. 30 tablet 1  . polyethylene glycol (MIRALAX / GLYCOLAX) packet Take 17 g by mouth daily as needed for mild constipation.    . potassium chloride SA (K-DUR,KLOR-CON) 20 MEQ tablet Take 1 tablet (20 mEq total) by mouth 2 (two) times daily. 14 tablet 0  . dexamethasone (DECADRON) 4 MG tablet Take 2 tablets by mouth once a day on the day after chemotherapy and then take 2 tablets two times a day for 2 days. Take with food. (Patient not taking: Reported on 03/26/2018) 30 tablet 1   No current facility-administered medications for this visit.    Facility-Administered Medications Ordered in Other Visits  Medication Dose Route Frequency Provider Last Rate Last Dose  . heparin lock flush 100 unit/mL  500 Units Intracatheter PRN Corcoran, Melissa C, MD      . heparin lock flush 100 unit/mL  500 Units Intravenous Once Corcoran, Melissa C, MD      . sodium chloride flush (NS) 0.9 % injection 10 mL  10 mL Intravenous PRN Lequita Asal, MD   10 mL at 04/03/18 1610    Review of Systems  Constitutional: Negative for diaphoresis, fever, malaise/fatigue (energy has significantly improved while off treatment cycle) and weight loss (up 1 pound).  HENT: Negative.   Eyes: Negative.   Respiratory: Negative for cough, hemoptysis, sputum production and shortness of breath.   Cardiovascular: Negative for chest pain,  palpitations, orthopnea, leg swelling and PND.  Gastrointestinal: Negative for abdominal pain, blood in stool, constipation, diarrhea, melena, nausea and vomiting.  Genitourinary: Negative for dysuria, frequency, hematuria and urgency.  Musculoskeletal: Negative for back pain, falls, joint pain and myalgias.  Skin: Negative for itching and rash.       RIGHT axilla abscess I&D; healing well with no evidence of infection; no drainage.   Neurological: Positive for tingling (transient neuropathy in hands; dropping things more; still able to manipulate buttons), tremors (essential) and headaches (migraines; chronic). Negative for dizziness and weakness.  Endo/Heme/Allergies: Does not bruise/bleed easily.  Psychiatric/Behavioral: Negative for depression, memory loss and suicidal ideas. The patient is nervous/anxious (r/t slow recovery of blood counts). The patient does not have insomnia.   All other  systems reviewed and are negative.  Vitals signs: BP 120/82 (BP Location: Left Arm, Patient Position: Sitting)   Pulse 74   Temp 97.9 F (36.6 C) (Tympanic)   Resp 18   Wt 149 lb 9 oz (67.8 kg)   BMI 24.89 kg/m   Physical Exam  Constitutional: She is oriented to person, place, and time and well-developed, well-nourished, and in no distress. No distress.  HENT:  Head: Normocephalic. Hair is normal (chemotherapy induced allopecia; wearing short strawberry blonde wig).  Mouth/Throat: Oropharynx is clear and moist.  Eyes: Pupils are equal, round, and reactive to light. EOM are normal. No scleral icterus.  Green eyes  Neck: Normal range of motion. Neck supple. No tracheal deviation present.  Cardiovascular: Normal rate, regular rhythm and normal heart sounds. Exam reveals no gallop and no friction rub.  No murmur heard. Pulmonary/Chest: Effort normal and breath sounds normal. No respiratory distress. She has no wheezes. She has no rales.  Abdominal: Soft. Bowel sounds are normal. She exhibits no  mass. There is no tenderness.  Musculoskeletal: Normal range of motion. She exhibits no edema or tenderness.  Lymphadenopathy:    She has no cervical adenopathy.  Neurological: She is alert and oriented to person, place, and time. Gait normal.  Skin: Skin is warm, dry and intact. No rash noted. She is not diaphoretic. No pallor.     Psychiatric: Mood, affect and judgment normal.   No results found for this or any previous visit (from the past 24 hour(s)).   Assessment:  Megan Vega is a 62 y.o. female with clinical stage [id number] right breast cancer and DCIS in the left breast s/p ultrasound guided biopsy on 10/29/2017. Pathology from the right breast at the 10 o'clock position 3 cm from the nipple revealed a 1.4 cm grade II invasive mammary carcinoma of no special type.  Tumor was ER + (> 90%), PR + (90%) and Her2/neu 1+.  Right axillary node biopsy revealed metastatic carcinoma.  Left breast biopsy at the 2 o'clock position 3 cm from the nipple revealed high grade DCIS with comedonecrosis.  CA27.29 was 23.5 on 11/14/2017.  Bilateral diagnostic mammogram and ultrasound on 10/23/2017 revealed a 3.9 x 1.7 x 2.2 cm irregular hypoechoic mass with associated areas of vascularity at the 10 o'clock position 3 cm from the nipple and a 1.1 x 0.5 x 0.4 cm area of nodularity (3 approximately 3 mm nodules at 10 o'clock 6 cm from the nipple in the right breast. The mass was within a 5 cm area of pleomorphic microcalcifications.  Ultrasound revealed at least 5 prominent lymph nodes with diffuse thickened and hypoechoic cortices suggestive of metastatic disease.  In the left breast, there was an irregular 1.6 x 1.3 x 1.2 cm hypoechoic mass with indistinct margins at 2 o'clock 3 cm from the nipple.  Axillary ultrasound revealed normal nodes.  Bilateral breast MRI on 11/17/2017 revealed a 4.3 x 4.8 x 5 cm mass on the right centered in the upper outer quadrant but also extends into the lower outer and upper inner  quadrants.  There was a small amount of enhancement posterior to the right nipple which could represent subtle extension. There was a cluster of nodules located posterior to the superior aspect of the known malignancy and another small nodule located posterior to the inferior aspect of the malignancy which could represent satellite lesions. Taking the enhancement posterior to the right nipple and the potential posterior satellite lesions into account, the AP dimension could be as  large as 7 cm.  The patient's known DCIS spans 5 x 5 x 4.4 cm mammographically based on calcifications. The associated enhancement spans 3.2 x 3.2 x 3.3 cm. However, there were 2 small linearly enhancing regions at 6 o'clock in the left breast which could represent further disease.  There was extensive adenopathy in the right axilla and right retropectoral region. There was at least 1 significantly enlarged right internal mammary lymph node.  PET scan on 11/24/2017 revealed hypermetabolic right axillary (2.6 x 3.1 cm; SUV 8.0), subpectoral/internal mammary (7 mm; SUV 2.4) adenopathy.  There was a 4 mm right lower lobe nodule is too small for PET resolution.  There was cholelithiasis.  She received 4 cycles of AC (12/05/2017 - 01/30/2018) with OnPro Neulasta support.  She is s/p 3 weeks of Taxol (02/13/2018 - 02/27/2018).  Week #4 was delayed secondary to a right axillary abscess and neutropenia.   Breast ultrasound on 02/18/2018 revealed a 1.0 x 1.36 x 2.14 cm dominant mass in the upper outer quadrant of the right breast (>50% volume decrease).  Axillary lymph nodes showed marked improvement with only 2 of  5 previous identified  lymph nodes identifiable (largest 1.3 cm; smaller node with cortical thickness 0.39 compared to 0.99).  Bone density on 12/16/2016 revealed osteopenia with a T-score of -1.6 in the left femoral neck on 12/16/2017.  She developed a right axillary abscess and underwent incision and drainage on 03/12/2018.   Culture grew staph aureus.  She was treated with Bactrim DS.  Symptomatically, she feels well. She has no acute concerns. Her energy has improved off of chemotherapy. She denies B symptoms.  Axillary abscess is healing well with no signs of infection. Bactrim completed on 03/26/2018.  Exam is stable.  WBC remains low at 2900 (ANC 1400).   Plan: 1. Labs today:  CBC with diff, CMP, Mg. 2. Labs reviewed. Neutropenia persists following Bactrim induced myelosuppression. Will hold week #4 taxol another week to allow for count recovery.Discuss neutropenic precautions. Stressed importance of avoiding sick contacts and handwashing. Discussed working from home while counts are low, as patient works at [employer] 3. Reviewed course of treatment. Patient will complete 12 weeks of taxol prior to surgery. Listened to concerns related to frustrations and anxieties related to slow count recovery. 4. Discussed reconstructive surgery referral. Patient would like to meet with plastic surgeon prior to proceeding with surgery. She has not fully decided on lumpectomy vs. mastectomy. Several questions were fielded today. Reviewed the fact that lumpectomy would necessitate post-surgical radiation course. Patient verbalized understanding. It sounds as if patient is leaning towards a mastectomy, however has concerns related to the longevity of the implants. She wishes to meet with plastics to discuss everything in order to allow for adequate time to weigh her options. Will refer to reconstructive surgeon, Dr. Sharmaine Base.  5. Discuss symptom management.  Patient has antiemetics and pain medications at home to use on a PRN basis. She has not had to use these interventions in the recent past. Encouraged patient to use interventions, on a PRN basis, as previously prescribed.  6. Discuss visit support over the course of the next few weeks:   Daughter [name]) is not going to be able to come to appointments/treatments  with patient.  Asking that nurse navigator Vita Barley, RN) be asked to come with patient for support and for informational purposes. Navigator contacted after clinic to discuss. Vita Barley, RN is more than willing to oblige this request.   Patient's daughter  advising that surgeon provides a short recap of the visit on MyChart for review. Due to the nature of the patient's disease, she is often overwhelmed with information and can't recall everything when she leaves. Will add visit "plan" to AVS for future visits.   Patient encouraged to call, or utilize MyChart, at any time with questions, concerns, or if she needs to clarify any information dicussed during her visit.  7.  RTC in 1 week for MD assessment, labs (CBC with diff, CMP, Mg), and week #4 Taxol.   Honor Loh, NP 04/03/2018, 9:43 AM

## 2018-04-06 ENCOUNTER — Other Ambulatory Visit: Payer: Self-pay | Admitting: Urgent Care

## 2018-04-06 DIAGNOSIS — T451X5A Adverse effect of antineoplastic and immunosuppressive drugs, initial encounter: Secondary | ICD-10-CM | POA: Insufficient documentation

## 2018-04-06 DIAGNOSIS — D701 Agranulocytosis secondary to cancer chemotherapy: Secondary | ICD-10-CM | POA: Insufficient documentation

## 2018-04-10 ENCOUNTER — Inpatient Hospital Stay: Payer: 59

## 2018-04-10 ENCOUNTER — Encounter: Payer: Self-pay | Admitting: Hematology and Oncology

## 2018-04-10 ENCOUNTER — Inpatient Hospital Stay (HOSPITAL_BASED_OUTPATIENT_CLINIC_OR_DEPARTMENT_OTHER): Payer: 59 | Admitting: Hematology and Oncology

## 2018-04-10 VITALS — BP 126/80 | HR 79 | Temp 97.8°F | Resp 18 | Ht 65.0 in | Wt 151.2 lb

## 2018-04-10 DIAGNOSIS — D709 Neutropenia, unspecified: Secondary | ICD-10-CM | POA: Diagnosis not present

## 2018-04-10 DIAGNOSIS — D0512 Intraductal carcinoma in situ of left breast: Secondary | ICD-10-CM

## 2018-04-10 DIAGNOSIS — Z800 Family history of malignant neoplasm of digestive organs: Secondary | ICD-10-CM

## 2018-04-10 DIAGNOSIS — K8020 Calculus of gallbladder without cholecystitis without obstruction: Secondary | ICD-10-CM

## 2018-04-10 DIAGNOSIS — Z801 Family history of malignant neoplasm of trachea, bronchus and lung: Secondary | ICD-10-CM

## 2018-04-10 DIAGNOSIS — Z8614 Personal history of Methicillin resistant Staphylococcus aureus infection: Secondary | ICD-10-CM | POA: Diagnosis not present

## 2018-04-10 DIAGNOSIS — C773 Secondary and unspecified malignant neoplasm of axilla and upper limb lymph nodes: Secondary | ICD-10-CM | POA: Diagnosis not present

## 2018-04-10 DIAGNOSIS — C50811 Malignant neoplasm of overlapping sites of right female breast: Secondary | ICD-10-CM | POA: Diagnosis not present

## 2018-04-10 DIAGNOSIS — Z5111 Encounter for antineoplastic chemotherapy: Secondary | ICD-10-CM

## 2018-04-10 DIAGNOSIS — Z87891 Personal history of nicotine dependence: Secondary | ICD-10-CM

## 2018-04-10 DIAGNOSIS — Z170 Estrogen receptor positive status [ER+]: Secondary | ICD-10-CM | POA: Diagnosis not present

## 2018-04-10 DIAGNOSIS — C50411 Malignant neoplasm of upper-outer quadrant of right female breast: Secondary | ICD-10-CM

## 2018-04-10 DIAGNOSIS — Z85828 Personal history of other malignant neoplasm of skin: Secondary | ICD-10-CM

## 2018-04-10 DIAGNOSIS — Z79899 Other long term (current) drug therapy: Secondary | ICD-10-CM

## 2018-04-10 LAB — CBC WITH DIFFERENTIAL/PLATELET
Basophils Absolute: 0.1 10*3/uL (ref 0–0.1)
Basophils Relative: 3 %
Eosinophils Absolute: 0.1 10*3/uL (ref 0–0.7)
Eosinophils Relative: 5 %
HCT: 36 % (ref 35.0–47.0)
Hemoglobin: 12.5 g/dL (ref 12.0–16.0)
Lymphocytes Relative: 26 %
Lymphs Abs: 0.7 10*3/uL — ABNORMAL LOW (ref 1.0–3.6)
MCH: 30.1 pg (ref 26.0–34.0)
MCHC: 34.6 g/dL (ref 32.0–36.0)
MCV: 86.9 fL (ref 80.0–100.0)
Monocytes Absolute: 0.4 10*3/uL (ref 0.2–0.9)
Monocytes Relative: 14 %
Neutro Abs: 1.5 10*3/uL (ref 1.4–6.5)
Neutrophils Relative %: 52 %
Platelets: 219 10*3/uL (ref 150–440)
RBC: 4.14 MIL/uL (ref 3.80–5.20)
RDW: 15.9 % — ABNORMAL HIGH (ref 11.5–14.5)
WBC: 2.9 10*3/uL — ABNORMAL LOW (ref 3.6–11.0)

## 2018-04-10 LAB — COMPREHENSIVE METABOLIC PANEL
ALT: 21 U/L (ref 14–54)
AST: 30 U/L (ref 15–41)
Albumin: 3.9 g/dL (ref 3.5–5.0)
Alkaline Phosphatase: 72 U/L (ref 38–126)
Anion gap: 8 (ref 5–15)
BUN: 16 mg/dL (ref 6–20)
CO2: 25 mmol/L (ref 22–32)
Calcium: 9.1 mg/dL (ref 8.9–10.3)
Chloride: 106 mmol/L (ref 101–111)
Creatinine, Ser: 0.73 mg/dL (ref 0.44–1.00)
GFR calc Af Amer: >60 mL/min (ref >60)
GFR calc non Af Amer: >60 mL/min (ref >60)
Glucose, Bld: 123 mg/dL — ABNORMAL HIGH (ref 65–99)
Potassium: 3.8 mmol/L (ref 3.5–5.1)
Sodium: 139 mmol/L (ref 135–145)
Total Bilirubin: 0.7 mg/dL (ref 0.3–1.2)
Total Protein: 6.7 g/dL (ref 6.5–8.1)

## 2018-04-10 LAB — MAGNESIUM: Magnesium: 1.9 mg/dL (ref 1.7–2.4)

## 2018-04-10 MED ORDER — HEPARIN SOD (PORK) LOCK FLUSH 100 UNIT/ML IV SOLN
500.0000 [IU] | Freq: Once | INTRAVENOUS | Status: AC
  Administered 2018-04-10: 500 [IU] via INTRAVENOUS
  Filled 2018-04-10: qty 5

## 2018-04-10 MED ORDER — DIPHENHYDRAMINE HCL 50 MG/ML IJ SOLN
50.0000 mg | Freq: Once | INTRAMUSCULAR | Status: AC
  Administered 2018-04-10: 50 mg via INTRAVENOUS
  Filled 2018-04-10: qty 1

## 2018-04-10 MED ORDER — SODIUM CHLORIDE 0.9% FLUSH
10.0000 mL | Freq: Once | INTRAVENOUS | Status: AC
  Administered 2018-04-10: 10 mL via INTRAVENOUS
  Filled 2018-04-10: qty 10

## 2018-04-10 MED ORDER — DEXAMETHASONE SODIUM PHOSPHATE 100 MG/10ML IJ SOLN
20.0000 mg | Freq: Once | INTRAMUSCULAR | Status: AC
  Administered 2018-04-10: 20 mg via INTRAVENOUS
  Filled 2018-04-10: qty 2

## 2018-04-10 MED ORDER — SODIUM CHLORIDE 0.9 % IV SOLN
80.0000 mg/m2 | Freq: Once | INTRAVENOUS | Status: AC
  Administered 2018-04-10: 144 mg via INTRAVENOUS
  Filled 2018-04-10: qty 24

## 2018-04-10 MED ORDER — FAMOTIDINE IN NACL 20-0.9 MG/50ML-% IV SOLN
20.0000 mg | Freq: Once | INTRAVENOUS | Status: AC
  Administered 2018-04-10: 20 mg via INTRAVENOUS
  Filled 2018-04-10: qty 50

## 2018-04-10 MED ORDER — SODIUM CHLORIDE 0.9 % IV SOLN
Freq: Once | INTRAVENOUS | Status: AC
Start: 2018-04-10 — End: 2018-04-10
  Administered 2018-04-10: 10:00:00 via INTRAVENOUS
  Filled 2018-04-10: qty 1000

## 2018-04-10 NOTE — Progress Notes (Signed)
[facility] day:  04/10/2018   Chief Complaint: Megan Vega is a 62 y.o. female with stage IIIB right breast cancer and DCIS of the left breast who is seen for assessment prior to week #4 Taxol.  HPI: The patient was last seen in the medical oncology clinic on 04/03/2018 by Honor Loh, NP.  At that time, she felt well.  She denied any acute concerns. Her energy had improved off of chemotherapy.  She denied B symptoms.  Axillary abscess was healing well with no signs of infection. Bactrim completed on 03/26/2018.  Exam was stable.  WBC remained low at 2900 (ANC 1400).  Chemotherapy was held.  During the interim, she has done well.  She voices no complaints.  She feels good.   Past Medical History:  Diagnosis Date  . Ankle fracture   . Cancer [facility])    Basal Cell Carcinoma, about 2011 chest  . Cataract   . Family history of adverse reaction to anesthesia    DAUGHTER-N/V  . Hemorrhoids   . History of methicillin resistant staphylococcus aureus (MRSA) 2015  . Migraines    MIGRAINES    Past Surgical History:  Procedure Laterality Date  . BREAST BIOPSY Bilateral 10/29/2017   L breast DCIS, R breast invasive mammary carcinoma of no special type, ER/PR+, Her2/neu 1+  . CESAREAN SECTION    . COLONOSCOPY  2012  . IRRIGATION AND DEBRIDEMENT ABSCESS Right 03/12/2018   Procedure: IRRIGATION AND DEBRIDEMENT RIGHT AXILLARY ABSCESS;  Surgeon: Robert Bellow, MD;  Location: [facility] ORS;  Service: General;  Laterality: Right;  . MANDIBLE FRACTURE SURGERY    . PORTACATH PLACEMENT Left 11/28/2017   Procedure: INSERTION PORT-A-CATH;  Surgeon: Robert Bellow, MD;  Location: [facility] ORS;  Service: General;  Laterality: Left;    Family History  Problem Relation Age of Onset  . Cancer Mother        Esophageal Cancer  . Early death Mother        Age 67  . Cancer Father        Lung Cancer  . Diabetes Father   . Diabetes Brother        Controlled  with diet  . Heart attack Paternal Grandfather     Social History:  reports that she quit smoking about 41 years ago. She has a 2.50 pack-year smoking history. She has never used smokeless tobacco. She reports that she drinks about 0.6 oz of alcohol per week. She reports that she does not use drugs.  Patient is a former smoker for 20 years; only smoked "about 3 cigarettes a day"; stopped 30 years ago. Patient drinks alcohol very infrequently; "about 1 glass a month". Patient currently employed as an Web designer at [employer] She denies any known exposures to radiations or toxins.  She lives in [place].  Her grand daughter is graduating in 03/2018.  She has one daughter Megan Vega).  She is alone  today.  Allergies:  Allergies  Allergen Reactions  . Peanut-Containing Drug Products Swelling and Other (See Comments)    Only when eating too many peanuts, swelling with lips and tingly face    Current Medications: Current Outpatient Medications  Medication Sig Dispense Refill  . b complex vitamins tablet Take 1 tablet by mouth daily.    Marland Kitchen loratadine (CLARITIN) 10 MG tablet Take 10 mg by mouth daily.    . Multiple Vitamins-Minerals (HAIR/SKIN/NAILS/BIOTIN PO) Take 1 tablet by mouth daily.     Marland Kitchen  acetaminophen (TYLENOL) 500 MG tablet Take 1,000 mg by mouth every 6 (six) hours as needed for moderate pain or headache.    . dexamethasone (DECADRON) 4 MG tablet Take 2 tablets by mouth once a day on the day after chemotherapy and then take 2 tablets two times a day for 2 days. Take with food. (Patient not taking: Reported on 03/26/2018) 30 tablet 1  . lidocaine-prilocaine (EMLA) cream Apply to affected area once (Patient not taking: Reported on 04/10/2018) 30 g 3  . LORazepam (ATIVAN) 0.5 MG tablet Take 1 tablet (0.5 mg total) by mouth every 6 (six) hours as needed (Nausea or vomiting). (Patient not taking: Reported on 04/10/2018) 30 tablet 0  . ondansetron (ZOFRAN) 8 MG tablet Take 1  tablet (8 mg total) by mouth 2 (two) times daily as needed. Start on the third day after chemotherapy. (Patient not taking: Reported on 04/10/2018) 30 tablet 1  . polyethylene glycol (MIRALAX / GLYCOLAX) packet Take 17 g by mouth daily as needed for mild constipation.    . potassium chloride SA (K-DUR,KLOR-CON) 20 MEQ tablet Take 1 tablet (20 mEq total) by mouth 2 (two) times daily. (Patient not taking: Reported on 04/10/2018) 14 tablet 0   No current facility-administered medications for this visit.    Facility-Administered Medications Ordered in Other Visits  Medication Dose Route Frequency Provider Last Rate Last Dose  . heparin lock flush 100 unit/mL  500 Units Intracatheter PRN Corcoran, Melissa C, MD      . heparin lock flush 100 unit/mL  500 Units Intravenous Once Lequita Asal, MD        Review of Systems  Constitutional: Negative for diaphoresis, fever, malaise/fatigue and weight loss (up  2 pounds).       Feels good.  HENT: Negative.  Negative for congestion, ear pain, hearing loss, nosebleeds, sinus pain and sore throat.   Eyes: Negative.  Negative for blurred vision, pain, discharge and redness.  Respiratory: Negative for cough, hemoptysis, sputum production and shortness of breath.   Cardiovascular: Negative.  Negative for chest pain, palpitations, orthopnea, leg swelling and PND.  Gastrointestinal: Negative.  Negative for abdominal pain, blood in stool, constipation, diarrhea, melena, nausea and vomiting.  Genitourinary: Negative.  Negative for dysuria, frequency, hematuria and urgency.  Musculoskeletal: Negative.  Negative for back pain, falls, joint pain and myalgias.  Skin: Negative.  Negative for itching and rash.       RIGHT axilla healed   Neurological: Negative for dizziness, tingling, tremors (essential), focal weakness, weakness and headaches (migraines; chronic).  Endo/Heme/Allergies: Negative.  Does not bruise/bleed easily.  Psychiatric/Behavioral: Negative for  depression, memory loss and suicidal ideas. The patient is not nervous/anxious and does not have insomnia.   All other systems reviewed and are negative.  Vitals signs: BP 126/80 (BP Location: Left Arm, Patient Position: Sitting)   Pulse 79   Temp 97.8 F (36.6 C) (Tympanic)   Resp 18   Ht 5' 5" (1.651 m)   Wt 151 lb 3.2 oz (68.6 kg)   SpO2 96%   BMI 25.16 kg/m   Physical Exam  Constitutional: She is oriented to person, place, and time and well-developed, well-nourished, and in no distress. No distress.  HENT:  Head: Normocephalic and atraumatic. Hair is normal (chemotherapy induced allopecia; wearing short strawberry blonde wig).  Mouth/Throat: Oropharynx is clear and moist.  Eyes: Pupils are equal, round, and reactive to light. Conjunctivae and EOM are normal. No scleral icterus.  Green eyes  Neck: Normal range  of motion. Neck supple. No JVD present. No tracheal deviation present.  Cardiovascular: Normal rate, regular rhythm and normal heart sounds. Exam reveals no gallop and no friction rub.  No murmur heard. Pulmonary/Chest: Effort normal and breath sounds normal. No respiratory distress. She has no wheezes. She has no rales.  Abdominal: Soft. Bowel sounds are normal. She exhibits no mass. There is no tenderness.  Musculoskeletal: Normal range of motion. She exhibits no edema or tenderness.  Lymphadenopathy:       Head (right side): No submandibular, no preauricular, no posterior auricular and no occipital adenopathy present.       Head (left side): No submandibular, no preauricular, no posterior auricular and no occipital adenopathy present.    She has no cervical adenopathy.    She has no axillary adenopathy.       Right: No inguinal adenopathy present.       Left: No inguinal adenopathy present.  Neurological: She is alert and oriented to person, place, and time. Gait normal.  Skin: Skin is warm, dry and intact. No rash noted. She is not diaphoretic. No pallor.     Right  axillary incision well healed without erythema, increased warmth or tenderness.  Psychiatric: Mood, affect and judgment normal.   No results found for this or any previous visit (from the past 24 hour(s)).   Assessment:  Megan Vega is a 62 y.o. female with clinical stage [id number] right breast cancer and DCIS in the left breast s/p ultrasound guided biopsy on 10/29/2017. Pathology from the right breast at the 10 o'clock position 3 cm from the nipple revealed a 1.4 cm grade II invasive mammary carcinoma of no special type.  Tumor was ER + (> 90%), PR + (90%) and Her2/neu 1+.  Right axillary node biopsy revealed metastatic carcinoma.  Left breast biopsy at the 2 o'clock position 3 cm from the nipple revealed high grade DCIS with comedonecrosis.  CA27.29 was 23.5 on 11/14/2017.  Bilateral diagnostic mammogram and ultrasound on 10/23/2017 revealed a 3.9 x 1.7 x 2.2 cm irregular hypoechoic mass with associated areas of vascularity at the 10 o'clock position 3 cm from the nipple and a 1.1 x 0.5 x 0.4 cm area of nodularity (3 approximately 3 mm nodules at 10 o'clock 6 cm from the nipple in the right breast. The mass was within a 5 cm area of pleomorphic microcalcifications.  Ultrasound revealed at least 5 prominent lymph nodes with diffuse thickened and hypoechoic cortices suggestive of metastatic disease.  In the left breast, there was an irregular 1.6 x 1.3 x 1.2 cm hypoechoic mass with indistinct margins at 2 o'clock 3 cm from the nipple.  Axillary ultrasound revealed normal nodes.  Bilateral breast MRI on 11/17/2017 revealed a 4.3 x 4.8 x 5 cm mass on the right centered in the upper outer quadrant but also extends into the lower outer and upper inner quadrants.  There was a small amount of enhancement posterior to the right nipple which could represent subtle extension. There was a cluster of nodules located posterior to the superior aspect of the known malignancy and another small nodule located posterior  to the inferior aspect of the malignancy which could represent satellite lesions. Taking the enhancement posterior to the right nipple and the potential posterior satellite lesions into account, the AP dimension could be as large as 7 cm.  The patient's known DCIS spans 5 x 5 x 4.4 cm mammographically based on calcifications. The associated enhancement spans 3.2 x 3.2 x 3.3  cm. However, there were 2 small linearly enhancing regions at 6 o'clock in the left breast which could represent further disease.  There was extensive adenopathy in the right axilla and right retropectoral region. There was at least 1 significantly enlarged right internal mammary lymph node.  PET scan on 11/24/2017 revealed hypermetabolic right axillary (2.6 x 3.1 cm; SUV 8.0), subpectoral/internal mammary (7 mm; SUV 2.4) adenopathy.  There was a 4 mm right lower lobe nodule is too small for PET resolution.  There was cholelithiasis.  She received 4 cycles of AC (12/05/2017 - 01/30/2018) with OnPro Neulasta support.  She is s/p 3 weeks of Taxol (02/13/2018 - 02/27/2018).  Week #4 was delayed secondary to a right axillary abscess and neutropenia.   Breast ultrasound on 02/18/2018 revealed a 1.0 x 1.36 x 2.14 cm dominant mass in the upper outer quadrant of the right breast (>50% volume decrease).  Axillary lymph nodes showed marked improvement with only 2 of  5 previous identified  lymph nodes identifiable (largest 1.3 cm; smaller node with cortical thickness 0.39 compared to 0.99).  Bone density on 12/16/2016 revealed osteopenia with a T-score of -1.6 in the left femoral neck on 12/16/2017.  She developed a right axillary abscess and underwent incision and drainage on 03/12/2018.  Culture grew staph aureus.  She completed Bactrim on 03/26/2018.  Symptomatically, she feels well.  Axillary abscess is well healed with no signs of infection.  Exam is stable.  WBC remains  low at 2900 (ANC 1500).   Plan: 1.  Labs today:  CBC with diff,  CMP, Mg.  2.  Discuss persistent borderline counts and need to reinitiate chemotherapy.  Patient in agreement,  Discuss consideration of GCSF daily (up to 4 days/week) to continue treatment.  Will proceed with treatment today.  Check counts on 05/28 (after holiday weekend) and consider GCSF if ANC is < 1500.  May need to switch future chemotherapy to Mondays for additional days of GCSF with at least 48 hours off GCSF prior to next weekly dose of Taxol. 3.  Week #4 Taxol today 4.  RTC on 04/14/2018 for CBC with diff +/- Neupogen 5.  RTC on 04/17/2018 for MD assessment, labs (CBC with diff, CMP, Mg) and week #5 Taxol.   Lequita Asal, MD 04/10/2018, 8:49 AM

## 2018-04-10 NOTE — Progress Notes (Signed)
No new changes noted today 

## 2018-04-14 ENCOUNTER — Inpatient Hospital Stay: Payer: 59

## 2018-04-14 DIAGNOSIS — C50811 Malignant neoplasm of overlapping sites of right female breast: Secondary | ICD-10-CM | POA: Diagnosis not present

## 2018-04-14 DIAGNOSIS — C50411 Malignant neoplasm of upper-outer quadrant of right female breast: Secondary | ICD-10-CM

## 2018-04-14 LAB — CBC WITH DIFFERENTIAL/PLATELET
Basophils Absolute: 0.1 10*3/uL (ref 0–0.1)
Basophils Relative: 2 %
Eosinophils Absolute: 0.1 10*3/uL (ref 0–0.7)
Eosinophils Relative: 3 %
HCT: 36.4 % (ref 35.0–47.0)
Hemoglobin: 12.6 g/dL (ref 12.0–16.0)
Lymphocytes Relative: 26 %
Lymphs Abs: 1 10*3/uL (ref 1.0–3.6)
MCH: 29.9 pg (ref 26.0–34.0)
MCHC: 34.5 g/dL (ref 32.0–36.0)
MCV: 86.8 fL (ref 80.0–100.0)
Monocytes Absolute: 0.2 10*3/uL (ref 0.2–0.9)
Monocytes Relative: 5 %
Neutro Abs: 2.4 10*3/uL (ref 1.4–6.5)
Neutrophils Relative %: 64 %
Platelets: 235 10*3/uL (ref 150–440)
RBC: 4.2 MIL/uL (ref 3.80–5.20)
RDW: 15 % — ABNORMAL HIGH (ref 11.5–14.5)
WBC: 3.7 10*3/uL (ref 3.6–11.0)

## 2018-04-17 ENCOUNTER — Other Ambulatory Visit: Payer: Self-pay | Admitting: Hematology and Oncology

## 2018-04-17 ENCOUNTER — Inpatient Hospital Stay: Payer: 59

## 2018-04-17 ENCOUNTER — Encounter: Payer: Self-pay | Admitting: Hematology and Oncology

## 2018-04-17 ENCOUNTER — Encounter: Payer: Self-pay | Admitting: *Deleted

## 2018-04-17 ENCOUNTER — Inpatient Hospital Stay (HOSPITAL_BASED_OUTPATIENT_CLINIC_OR_DEPARTMENT_OTHER): Payer: 59 | Admitting: Hematology and Oncology

## 2018-04-17 VITALS — BP 119/78 | HR 86 | Temp 97.3°F | Resp 18 | Ht 65.0 in | Wt 150.9 lb

## 2018-04-17 DIAGNOSIS — Z801 Family history of malignant neoplasm of trachea, bronchus and lung: Secondary | ICD-10-CM

## 2018-04-17 DIAGNOSIS — Z7189 Other specified counseling: Secondary | ICD-10-CM

## 2018-04-17 DIAGNOSIS — Z800 Family history of malignant neoplasm of digestive organs: Secondary | ICD-10-CM | POA: Diagnosis not present

## 2018-04-17 DIAGNOSIS — C773 Secondary and unspecified malignant neoplasm of axilla and upper limb lymph nodes: Secondary | ICD-10-CM | POA: Diagnosis not present

## 2018-04-17 DIAGNOSIS — Z87891 Personal history of nicotine dependence: Secondary | ICD-10-CM | POA: Diagnosis not present

## 2018-04-17 DIAGNOSIS — D0512 Intraductal carcinoma in situ of left breast: Secondary | ICD-10-CM

## 2018-04-17 DIAGNOSIS — K8020 Calculus of gallbladder without cholecystitis without obstruction: Secondary | ICD-10-CM | POA: Diagnosis not present

## 2018-04-17 DIAGNOSIS — C50811 Malignant neoplasm of overlapping sites of right female breast: Secondary | ICD-10-CM | POA: Diagnosis not present

## 2018-04-17 DIAGNOSIS — Z85828 Personal history of other malignant neoplasm of skin: Secondary | ICD-10-CM

## 2018-04-17 DIAGNOSIS — D709 Neutropenia, unspecified: Secondary | ICD-10-CM

## 2018-04-17 DIAGNOSIS — Z5111 Encounter for antineoplastic chemotherapy: Secondary | ICD-10-CM

## 2018-04-17 DIAGNOSIS — D72819 Decreased white blood cell count, unspecified: Secondary | ICD-10-CM

## 2018-04-17 DIAGNOSIS — Z8614 Personal history of Methicillin resistant Staphylococcus aureus infection: Secondary | ICD-10-CM

## 2018-04-17 DIAGNOSIS — Z79899 Other long term (current) drug therapy: Secondary | ICD-10-CM | POA: Diagnosis not present

## 2018-04-17 DIAGNOSIS — Z170 Estrogen receptor positive status [ER+]: Secondary | ICD-10-CM

## 2018-04-17 DIAGNOSIS — C50411 Malignant neoplasm of upper-outer quadrant of right female breast: Secondary | ICD-10-CM

## 2018-04-17 LAB — COMPREHENSIVE METABOLIC PANEL
ALT: 24 U/L (ref 14–54)
AST: 30 U/L (ref 15–41)
Albumin: 3.9 g/dL (ref 3.5–5.0)
Alkaline Phosphatase: 73 U/L (ref 38–126)
Anion gap: 8 (ref 5–15)
BUN: 16 mg/dL (ref 6–20)
CO2: 24 mmol/L (ref 22–32)
Calcium: 8.8 mg/dL — ABNORMAL LOW (ref 8.9–10.3)
Chloride: 107 mmol/L (ref 101–111)
Creatinine, Ser: 0.88 mg/dL (ref 0.44–1.00)
GFR calc Af Amer: >60 mL/min (ref >60)
GFR calc non Af Amer: >60 mL/min (ref >60)
Glucose, Bld: 153 mg/dL — ABNORMAL HIGH (ref 65–99)
Potassium: 3.9 mmol/L (ref 3.5–5.1)
Sodium: 139 mmol/L (ref 135–145)
Total Bilirubin: 0.4 mg/dL (ref 0.3–1.2)
Total Protein: 6.5 g/dL (ref 6.5–8.1)

## 2018-04-17 LAB — CBC WITH DIFFERENTIAL/PLATELET
Basophils Absolute: 0.1 10*3/uL (ref 0–0.1)
Basophils Relative: 3 %
Eosinophils Absolute: 0.1 10*3/uL (ref 0–0.7)
Eosinophils Relative: 3 %
HCT: 35.2 % (ref 35.0–47.0)
Hemoglobin: 12.4 g/dL (ref 12.0–16.0)
Lymphocytes Relative: 23 %
Lymphs Abs: 0.7 10*3/uL — ABNORMAL LOW (ref 1.0–3.6)
MCH: 30.2 pg (ref 26.0–34.0)
MCHC: 35.1 g/dL (ref 32.0–36.0)
MCV: 86 fL (ref 80.0–100.0)
Monocytes Absolute: 0.2 10*3/uL (ref 0.2–0.9)
Monocytes Relative: 7 %
Neutro Abs: 2 10*3/uL (ref 1.4–6.5)
Neutrophils Relative %: 64 %
Platelets: 248 10*3/uL (ref 150–440)
RBC: 4.09 MIL/uL (ref 3.80–5.20)
RDW: 15 % — ABNORMAL HIGH (ref 11.5–14.5)
WBC: 3.1 10*3/uL — ABNORMAL LOW (ref 3.6–11.0)

## 2018-04-17 LAB — VITAMIN B12: Vitamin B-12: 559 pg/mL (ref 180–914)

## 2018-04-17 LAB — FOLATE: Folate: 49.2 ng/mL (ref >5.9)

## 2018-04-17 LAB — MAGNESIUM: Magnesium: 1.8 mg/dL (ref 1.7–2.4)

## 2018-04-17 MED ORDER — FAMOTIDINE IN NACL 20-0.9 MG/50ML-% IV SOLN
20.0000 mg | Freq: Once | INTRAVENOUS | Status: AC
  Administered 2018-04-17: 20 mg via INTRAVENOUS
  Filled 2018-04-17: qty 50

## 2018-04-17 MED ORDER — SODIUM CHLORIDE 0.9% FLUSH
10.0000 mL | INTRAVENOUS | Status: DC | PRN
  Administered 2018-04-17: 10 mL via INTRAVENOUS
  Filled 2018-04-17: qty 10

## 2018-04-17 MED ORDER — SODIUM CHLORIDE 0.9 % IV SOLN
Freq: Once | INTRAVENOUS | Status: AC
  Administered 2018-04-17: 10:00:00 via INTRAVENOUS
  Filled 2018-04-17: qty 1000

## 2018-04-17 MED ORDER — HEPARIN SOD (PORK) LOCK FLUSH 100 UNIT/ML IV SOLN: 500.0000 [IU] | Freq: Once | INTRAVENOUS | Status: DC | PRN

## 2018-04-17 MED ORDER — HEPARIN SOD (PORK) LOCK FLUSH 100 UNIT/ML IV SOLN
500.0000 [IU] | Freq: Once | INTRAVENOUS | Status: AC
  Administered 2018-04-17: 500 [IU] via INTRAVENOUS
  Filled 2018-04-17: qty 5

## 2018-04-17 MED ORDER — SODIUM CHLORIDE 0.9 % IV SOLN
80.0000 mg/m2 | Freq: Once | INTRAVENOUS | Status: AC
  Administered 2018-04-17: 144 mg via INTRAVENOUS
  Filled 2018-04-17: qty 24

## 2018-04-17 MED ORDER — DIPHENHYDRAMINE HCL 50 MG/ML IJ SOLN
50.0000 mg | Freq: Once | INTRAMUSCULAR | Status: AC
  Administered 2018-04-17: 50 mg via INTRAVENOUS
  Filled 2018-04-17: qty 1

## 2018-04-17 MED ORDER — SODIUM CHLORIDE 0.9 % IV SOLN
20.0000 mg | Freq: Once | INTRAVENOUS | Status: AC
  Administered 2018-04-17: 20 mg via INTRAVENOUS
  Filled 2018-04-17: qty 2

## 2018-04-17 NOTE — Patient Instructions (Signed)
Highlights from today's visit:  1. White count has improved -- WBC 3100 ([place] 2000).  2. Anticipate next ultrasound around cycle #8 of chemotherapy. 3. Will check counts one more time next week and give GCSF (blood booster) if needed. Hopfully you are over the low counts.   Call me, or send me a MyChart message with any questions. Have a GREAT week!!!  Respectfully, Honor Loh, MSN, APRN, FNP-C, CEN Oncology/Hematology Nurse Practitioner  [place] [facility]

## 2018-04-17 NOTE — Progress Notes (Signed)
No new changes noted today 

## 2018-04-17 NOTE — Progress Notes (Signed)
[facility] day:  04/17/2018   Chief Complaint: Megan Vega is a 62 y.o. female with stage IIIB right breast cancer and DCIS of the left breast who is seen for assessment prior to week #5 Taxol.  HPI: The patient was last seen in the medical oncology clinic on 04/10/2018.  At that time, she felt well.  Axillary abscess was well healed with no signs of infection.  Exam was stable.  WBC remained  low at 2900 (ANC 1500).  We discussed consideration of weekly GCSF support.  She received week #4 Taxol.  Labs on 04/14/2018 revealed a hematocrit of 36.4, hemoglobin 12.6, platelets 235,000, WBC 3700 with an ANC of 2400.  She did not received GCSF.  During the interim, she has done well.  There are no acute concerns. Patient denies B symptoms and interval infections. Patient is eating well.  Weight has increased and decreased by 1 pound recently.  Patient denies pain in the clinic today.    Past Medical History:  Diagnosis Date  . Ankle fracture   . Cancer [facility])    Basal Cell Carcinoma, about 2011 chest  . Cataract   . Family history of adverse reaction to anesthesia    DAUGHTER-N/V  . Hemorrhoids   . History of methicillin resistant staphylococcus aureus (MRSA) 2015  . Migraines    MIGRAINES    Past Surgical History:  Procedure Laterality Date  . BREAST BIOPSY Bilateral 10/29/2017   L breast DCIS, R breast invasive mammary carcinoma of no special type, ER/PR+, Her2/neu 1+  . CESAREAN SECTION    . COLONOSCOPY  2012  . IRRIGATION AND DEBRIDEMENT ABSCESS Right 03/12/2018   Procedure: IRRIGATION AND DEBRIDEMENT RIGHT AXILLARY ABSCESS;  Surgeon: Robert Bellow, MD;  Location: [facility] ORS;  Service: General;  Laterality: Right;  . MANDIBLE FRACTURE SURGERY    . PORTACATH PLACEMENT Left 11/28/2017   Procedure: INSERTION PORT-A-CATH;  Surgeon: Robert Bellow, MD;  Location: [facility] ORS;  Service: General;  Laterality: Left;    Family History    Problem Relation Age of Onset  . Cancer Mother        Esophageal Cancer  . Early death Mother        Age 20  . Cancer Father        Lung Cancer  . Diabetes Father   . Diabetes Brother        Controlled with diet  . Heart attack Paternal Grandfather     Social History:  reports that she quit smoking about 41 years ago. She has a 2.50 pack-year smoking history. She has never used smokeless tobacco. She reports that she drinks about 0.6 oz of alcohol per week. She reports that she does not use drugs.  Patient is a former smoker for 20 years; only smoked "about 3 cigarettes a day"; stopped 30 years ago. Patient drinks alcohol very infrequently; "about 1 glass a month". Patient currently employed as an Web designer at [employer]. She denies any known exposures to radiations or toxins.  She lives in [place].  Her grand daughter is graduating in 03/2018.  She has one daughter [name]).  She is alone  today.  Allergies:  Allergies  Allergen Reactions  . Peanut-Containing Drug Products Swelling and Other (See Comments)    Only when eating too many peanuts, swelling with lips and tingly face    Current Medications: Current Outpatient Medications  Medication Sig Dispense Refill  . b complex  vitamins tablet Take 1 tablet by mouth daily.    Marland Kitchen loratadine (CLARITIN) 10 MG tablet Take 10 mg by mouth daily.    . Multiple Vitamins-Minerals (HAIR/SKIN/NAILS/BIOTIN PO) Take 1 tablet by mouth daily.     Marland Kitchen acetaminophen (TYLENOL) 500 MG tablet Take 1,000 mg by mouth every 6 (six) hours as needed for moderate pain or headache.    . lidocaine-prilocaine (EMLA) cream Apply to affected area once (Patient not taking: Reported on 04/10/2018) 30 g 3  . LORazepam (ATIVAN) 0.5 MG tablet Take 1 tablet (0.5 mg total) by mouth every 6 (six) hours as needed (Nausea or vomiting). (Patient not taking: Reported on 04/10/2018) 30 tablet 0  . ondansetron (ZOFRAN) 8 MG tablet Take 1 tablet (8 mg  total) by mouth 2 (two) times daily as needed. Start on the third day after chemotherapy. (Patient not taking: Reported on 04/10/2018) 30 tablet 1  . polyethylene glycol (MIRALAX / GLYCOLAX) packet Take 17 g by mouth daily as needed for mild constipation.    . potassium chloride SA (K-DUR,KLOR-CON) 20 MEQ tablet Take 1 tablet (20 mEq total) by mouth 2 (two) times daily. (Patient not taking: Reported on 04/17/2018) 14 tablet 0   No current facility-administered medications for this visit.    Facility-Administered Medications Ordered in Other Visits  Medication Dose Route Frequency Provider Last Rate Last Dose  . heparin lock flush 100 unit/mL  500 Units Intracatheter PRN Corcoran, Melissa C, MD      . heparin lock flush 100 unit/mL  500 Units Intravenous Once Corcoran, Melissa C, MD      . sodium chloride flush (NS) 0.9 % injection 10 mL  10 mL Intravenous PRN Lequita Asal, MD   10 mL at 04/17/18 0813    Review of Systems  Constitutional: Positive for weight loss (down 1 pound). Negative for diaphoresis, fever and malaise/fatigue.       Feels good.  HENT: Negative.  Negative for congestion, ear pain, hearing loss, nosebleeds, sinus pain and sore throat.   Eyes: Negative.  Negative for blurred vision, pain, discharge and redness.  Respiratory: Negative for cough, hemoptysis, sputum production and shortness of breath.   Cardiovascular: Negative.  Negative for chest pain, palpitations, orthopnea, leg swelling and PND.  Gastrointestinal: Negative.  Negative for abdominal pain, blood in stool, constipation, diarrhea, melena, nausea and vomiting.  Genitourinary: Negative.  Negative for dysuria, frequency, hematuria and urgency.  Musculoskeletal: Negative.  Negative for back pain, falls, joint pain and myalgias.  Skin: Negative.  Negative for itching and rash.       RIGHT axilla healed   Neurological: Negative for dizziness, tingling, tremors (essential), focal weakness, weakness and headaches  (migraines; chronic).  Endo/Heme/Allergies: Negative.  Does not bruise/bleed easily.  Psychiatric/Behavioral: Negative for depression, memory loss and suicidal ideas. The patient is not nervous/anxious and does not have insomnia.   All other systems reviewed and are negative.  Vitals signs: BP 119/78 (BP Location: Left Arm, Patient Position: Sitting)   Pulse 86   Temp (!) 97.3 F (36.3 C) (Tympanic)   Resp 18   Ht _0  (1.651 m)   Wt 150 lb 14.4 oz (68.4 kg)   SpO2 97%   BMI 25.11 kg/m   Physical Exam  Constitutional: She is oriented to person, place, and time and well-developed, well-nourished, and in no distress. No distress.  HENT:  Head: Normocephalic and atraumatic. Hair is abnormal (chemotherapy induced alopecia; short strawberry blonde wig).  Mouth/Throat: Oropharynx is clear and  moist.  Eyes: Pupils are equal, round, and reactive to light. Conjunctivae and EOM are normal. No scleral icterus.  Hazel/green eyes  Neck: Normal range of motion. Neck supple. No JVD present. No tracheal deviation present.  Cardiovascular: Normal rate, regular rhythm and normal heart sounds. Exam reveals no gallop and no friction rub.  No murmur heard. Pulmonary/Chest: Effort normal and breath sounds normal. No respiratory distress. She has no wheezes. She has no rales.  Abdominal: Soft. Bowel sounds are normal. She exhibits no mass. There is no tenderness.  Musculoskeletal: Normal range of motion. She exhibits no edema or tenderness.  Lymphadenopathy:       Head (right side): No submandibular, no preauricular, no posterior auricular and no occipital adenopathy present.       Head (left side): No submandibular, no preauricular, no posterior auricular and no occipital adenopathy present.    She has no cervical adenopathy.    She has no axillary adenopathy.       Right: No inguinal adenopathy present.       Left: No inguinal adenopathy present.  Neurological: She is alert and oriented to person,  place, and time. Gait normal.  Skin: Skin is warm, dry and intact. No rash noted. She is not diaphoretic. No pallor.     Right axillary incision well healed without erythema, increased warmth or tenderness.  Psychiatric: Mood, affect and judgment normal.   No results found for this or any previous visit (from the past 24 hour(s)).   Assessment:  Myrene Bougher is a 62 y.o. female with clinical stage [id number] right breast cancer and DCIS in the left breast s/p ultrasound guided biopsy on 10/29/2017. Pathology from the right breast at the 10 o'clock position 3 cm from the nipple revealed a 1.4 cm grade II invasive mammary carcinoma of no special type.  Tumor was ER + (> 90%), PR + (90%) and Her2/neu 1+.  Right axillary node biopsy revealed metastatic carcinoma.  Left breast biopsy at the 2 o'clock position 3 cm from the nipple revealed high grade DCIS with comedonecrosis.  CA27.29 was 23.5 on 11/14/2017.  Bilateral diagnostic mammogram and ultrasound on 10/23/2017 revealed a 3.9 x 1.7 x 2.2 cm irregular hypoechoic mass with associated areas of vascularity at the 10 o'clock position 3 cm from the nipple and a 1.1 x 0.5 x 0.4 cm area of nodularity (3 approximately 3 mm nodules at 10 o'clock 6 cm from the nipple in the right breast. The mass was within a 5 cm area of pleomorphic microcalcifications.  Ultrasound revealed at least 5 prominent lymph nodes with diffuse thickened and hypoechoic cortices suggestive of metastatic disease.  In the left breast, there was an irregular 1.6 x 1.3 x 1.2 cm hypoechoic mass with indistinct margins at 2 o'clock 3 cm from the nipple.  Axillary ultrasound revealed normal nodes.  Bilateral breast MRI on 11/17/2017 revealed a 4.3 x 4.8 x 5 cm mass on the right centered in the upper outer quadrant but also extends into the lower outer and upper inner quadrants.  There was a small amount of enhancement posterior to the right nipple which could represent subtle extension. There  was a cluster of nodules located posterior to the superior aspect of the known malignancy and another small nodule located posterior to the inferior aspect of the malignancy which could represent satellite lesions. Taking the enhancement posterior to the right nipple and the potential posterior satellite lesions into account, the AP dimension could be as large as 7  cm.  The patient's known DCIS spans 5 x 5 x 4.4 cm mammographically based on calcifications. The associated enhancement spans 3.2 x 3.2 x 3.3 cm. However, there were 2 small linearly enhancing regions at 6 o'clock in the left breast which could represent further disease.  There was extensive adenopathy in the right axilla and right retropectoral region. There was at least 1 significantly enlarged right internal mammary lymph node.  PET scan on 11/24/2017 revealed hypermetabolic right axillary (2.6 x 3.1 cm; SUV 8.0), subpectoral/internal mammary (7 mm; SUV 2.4) adenopathy.  There was a 4 mm right lower lobe nodule is too small for PET resolution.  There was cholelithiasis.  She received 4 cycles of AC (12/05/2017 - 01/30/2018) with OnPro Neulasta support.  She is s/p 4 weeks of Taxol (02/13/2018 - 02/27/2018; 04/10/2018).  Week #4 was delayed secondary to a right axillary abscess and neutropenia.   Breast ultrasound on 02/18/2018 revealed a 1.0 x 1.36 x 2.14 cm dominant mass in the upper outer quadrant of the right breast (>50% volume decrease).  Axillary lymph nodes showed marked improvement with only 2 of  5 previous identified  lymph nodes identifiable (largest 1.3 cm; smaller node with cortical thickness 0.39 compared to 0.99).  Bone density on 12/16/2016 revealed osteopenia with a T-score of -1.6 in the left femoral neck on 12/16/2017.  She developed a right axillary abscess and underwent incision and drainage on 03/12/2018.  Culture grew staph aureus.  She completed Bactrim on 03/26/2018.  Symptomatically, she feels well.  Axillary  abscess is well healed with no signs of infection.  Exam is stable.  WBC 3100 ([place] 2000).  Potassium is 3.9.  Plan: 1.  Labs today:  CBC with diff, CMP, Mg, B12, folate.  2.  Labs reviewed.  Blood counts stable and adequate enough for treatment. Will proceed with week #5 Taxol today 3.  RTC on 04/21/2018 for labs (CBC with diff) and +/- GCSF. 4.  RTC in 1 week for labs (CBC with diff, CMP, Mg) and week #6 Taxol 5.  RTC in 2 weeks for MD assessment, labs (CBC with diff, CMP, Mg) and week #7 Taxol.   Honor Loh, NP 04/17/2018, 9:25 AM   I saw and evaluated the patient, participating in the key portions of the service and reviewing pertinent diagnostic studies and records.  I reviewed the nurse practitioner's note and agree with the findings and the plan.  The assessment and plan were discussed with the patient. Several questions were asked by the patient and answered.   Nolon Stalls, MD 04/17/2018,9:25 AM

## 2018-04-17 NOTE — Progress Notes (Signed)
  Oncology Nurse Navigator Documentation  Navigator Location: [facility] (04/17/18 0900)   )Navigator Encounter Type: Clinic/MDC (04/17/18 0900)                       Treatment Phase: Active Tx (04/17/18 0900) Barriers/Navigation Needs: No Needs (04/17/18 0900)                          Time Spent with Patient: 15 (04/17/18 0900)   Met patient today prior to her chemotherapy infusion.  She is doing better, states her "counts" have finally gotten better.  No needs at this time.

## 2018-04-21 ENCOUNTER — Other Ambulatory Visit: Payer: Self-pay

## 2018-04-21 ENCOUNTER — Inpatient Hospital Stay: Payer: 59

## 2018-04-21 ENCOUNTER — Inpatient Hospital Stay: Payer: 59 | Attending: Hematology and Oncology

## 2018-04-21 DIAGNOSIS — T451X5A Adverse effect of antineoplastic and immunosuppressive drugs, initial encounter: Secondary | ICD-10-CM | POA: Insufficient documentation

## 2018-04-21 DIAGNOSIS — Z5111 Encounter for antineoplastic chemotherapy: Secondary | ICD-10-CM | POA: Insufficient documentation

## 2018-04-21 DIAGNOSIS — Z87891 Personal history of nicotine dependence: Secondary | ICD-10-CM | POA: Insufficient documentation

## 2018-04-21 DIAGNOSIS — Z801 Family history of malignant neoplasm of trachea, bronchus and lung: Secondary | ICD-10-CM | POA: Diagnosis not present

## 2018-04-21 DIAGNOSIS — G620 Drug-induced polyneuropathy: Secondary | ICD-10-CM | POA: Diagnosis not present

## 2018-04-21 DIAGNOSIS — C50411 Malignant neoplasm of upper-outer quadrant of right female breast: Secondary | ICD-10-CM | POA: Diagnosis not present

## 2018-04-21 DIAGNOSIS — Z170 Estrogen receptor positive status [ER+]: Secondary | ICD-10-CM | POA: Insufficient documentation

## 2018-04-21 DIAGNOSIS — Z79899 Other long term (current) drug therapy: Secondary | ICD-10-CM | POA: Insufficient documentation

## 2018-04-21 DIAGNOSIS — Z800 Family history of malignant neoplasm of digestive organs: Secondary | ICD-10-CM | POA: Insufficient documentation

## 2018-04-21 DIAGNOSIS — Z85828 Personal history of other malignant neoplasm of skin: Secondary | ICD-10-CM | POA: Insufficient documentation

## 2018-04-21 LAB — CBC WITH DIFFERENTIAL/PLATELET
Basophils Absolute: 0.1 10*3/uL (ref 0–0.1)
Basophils Relative: 3 %
EOS ABS: 0.1 10*3/uL (ref 0–0.7)
EOS PCT: 3 %
HCT: 37.5 % (ref 35.0–47.0)
HEMOGLOBIN: 12.9 g/dL (ref 12.0–16.0)
LYMPHS ABS: 0.8 10*3/uL — AB (ref 1.0–3.6)
LYMPHS PCT: 29 %
MCH: 29.8 pg (ref 26.0–34.0)
MCHC: 34.3 g/dL (ref 32.0–36.0)
MCV: 86.7 fL (ref 80.0–100.0)
MONOS PCT: 4 %
Monocytes Absolute: 0.1 10*3/uL — ABNORMAL LOW (ref 0.2–0.9)
NEUTROS PCT: 61 %
Neutro Abs: 1.7 10*3/uL (ref 1.4–6.5)
Platelets: 262 10*3/uL (ref 150–440)
RBC: 4.32 MIL/uL (ref 3.80–5.20)
RDW: 14.7 % — ABNORMAL HIGH (ref 11.5–14.5)
WBC: 2.8 10*3/uL — AB (ref 3.6–11.0)

## 2018-04-23 ENCOUNTER — Ambulatory Visit: Payer: 59 | Admitting: General Surgery

## 2018-04-24 ENCOUNTER — Other Ambulatory Visit: Payer: Self-pay | Admitting: Hematology and Oncology

## 2018-04-24 ENCOUNTER — Inpatient Hospital Stay: Payer: 59 | Attending: Hematology and Oncology

## 2018-04-24 ENCOUNTER — Inpatient Hospital Stay: Payer: 59

## 2018-04-24 VITALS — BP 120/79 | HR 84 | Resp 18

## 2018-04-24 DIAGNOSIS — Z170 Estrogen receptor positive status [ER+]: Secondary | ICD-10-CM | POA: Insufficient documentation

## 2018-04-24 DIAGNOSIS — Z5111 Encounter for antineoplastic chemotherapy: Secondary | ICD-10-CM | POA: Insufficient documentation

## 2018-04-24 DIAGNOSIS — C50411 Malignant neoplasm of upper-outer quadrant of right female breast: Secondary | ICD-10-CM

## 2018-04-24 LAB — CBC WITH DIFFERENTIAL/PLATELET
BASOS ABS: 0.1 10*3/uL (ref 0–0.1)
BASOS PCT: 2 %
Eosinophils Absolute: 0.1 10*3/uL (ref 0–0.7)
Eosinophils Relative: 2 %
HEMATOCRIT: 35.8 % (ref 35.0–47.0)
Hemoglobin: 12.4 g/dL (ref 12.0–16.0)
LYMPHS PCT: 25 %
Lymphs Abs: 0.7 10*3/uL — ABNORMAL LOW (ref 1.0–3.6)
MCH: 29.6 pg (ref 26.0–34.0)
MCHC: 34.6 g/dL (ref 32.0–36.0)
MCV: 85.6 fL (ref 80.0–100.0)
MONO ABS: 0.2 10*3/uL (ref 0.2–0.9)
MONOS PCT: 8 %
NEUTROS ABS: 1.8 10*3/uL (ref 1.4–6.5)
NEUTROS PCT: 63 %
Platelets: 261 10*3/uL (ref 150–440)
RBC: 4.18 MIL/uL (ref 3.80–5.20)
RDW: 14.9 % — AB (ref 11.5–14.5)
WBC: 2.9 10*3/uL — ABNORMAL LOW (ref 3.6–11.0)

## 2018-04-24 LAB — COMPREHENSIVE METABOLIC PANEL
ALBUMIN: 3.8 g/dL (ref 3.5–5.0)
ALT: 27 U/L (ref 14–54)
AST: 29 U/L (ref 15–41)
Alkaline Phosphatase: 72 U/L (ref 38–126)
Anion gap: 7 (ref 5–15)
BUN: 13 mg/dL (ref 6–20)
CHLORIDE: 106 mmol/L (ref 101–111)
CO2: 25 mmol/L (ref 22–32)
Calcium: 8.9 mg/dL (ref 8.9–10.3)
Creatinine, Ser: 0.8 mg/dL (ref 0.44–1.00)
GFR calc Af Amer: >60 mL/min (ref >60)
Glucose, Bld: 132 mg/dL — ABNORMAL HIGH (ref 65–99)
POTASSIUM: 3.8 mmol/L (ref 3.5–5.1)
Sodium: 138 mmol/L (ref 135–145)
Total Bilirubin: 0.8 mg/dL (ref 0.3–1.2)
Total Protein: 6.4 g/dL — ABNORMAL LOW (ref 6.5–8.1)

## 2018-04-24 LAB — MAGNESIUM: MAGNESIUM: 2.3 mg/dL (ref 1.7–2.4)

## 2018-04-24 MED ORDER — FAMOTIDINE IN NACL 20-0.9 MG/50ML-% IV SOLN
20.0000 mg | Freq: Once | INTRAVENOUS | Status: AC
  Administered 2018-04-24: 20 mg via INTRAVENOUS
  Filled 2018-04-24: qty 50

## 2018-04-24 MED ORDER — HEPARIN SOD (PORK) LOCK FLUSH 100 UNIT/ML IV SOLN
500.0000 [IU] | Freq: Once | INTRAVENOUS | Status: AC | PRN
  Administered 2018-04-24: 500 [IU]

## 2018-04-24 MED ORDER — SODIUM CHLORIDE 0.9% FLUSH
10.0000 mL | Freq: Once | INTRAVENOUS | Status: AC
  Administered 2018-04-24: 10 mL via INTRAVENOUS
  Filled 2018-04-24: qty 10

## 2018-04-24 MED ORDER — SODIUM CHLORIDE 0.9 % IV SOLN
Freq: Once | INTRAVENOUS | Status: AC
  Administered 2018-04-24: 10:00:00 via INTRAVENOUS
  Filled 2018-04-24: qty 1000

## 2018-04-24 MED ORDER — SODIUM CHLORIDE 0.9 % IV SOLN
20.0000 mg | Freq: Once | INTRAVENOUS | Status: AC
  Administered 2018-04-24: 20 mg via INTRAVENOUS
  Filled 2018-04-24: qty 2

## 2018-04-24 MED ORDER — DIPHENHYDRAMINE HCL 50 MG/ML IJ SOLN
50.0000 mg | Freq: Once | INTRAMUSCULAR | Status: AC
  Administered 2018-04-24: 50 mg via INTRAVENOUS
  Filled 2018-04-24: qty 1

## 2018-04-24 MED ORDER — SODIUM CHLORIDE 0.9 % IV SOLN
80.0000 mg/m2 | Freq: Once | INTRAVENOUS | Status: AC
  Administered 2018-04-24: 144 mg via INTRAVENOUS
  Filled 2018-04-24: qty 24

## 2018-04-24 MED ORDER — HEPARIN SOD (PORK) LOCK FLUSH 100 UNIT/ML IV SOLN
500.0000 [IU] | Freq: Once | INTRAVENOUS | Status: DC
  Filled 2018-04-24: qty 5

## 2018-05-01 ENCOUNTER — Inpatient Hospital Stay (HOSPITAL_BASED_OUTPATIENT_CLINIC_OR_DEPARTMENT_OTHER): Payer: 59 | Admitting: Hematology and Oncology

## 2018-05-01 ENCOUNTER — Encounter: Payer: Self-pay | Admitting: Hematology and Oncology

## 2018-05-01 ENCOUNTER — Inpatient Hospital Stay: Payer: 59

## 2018-05-01 VITALS — BP 124/82 | HR 80 | Temp 97.8°F | Resp 16 | Wt 153.1 lb

## 2018-05-01 DIAGNOSIS — Z79899 Other long term (current) drug therapy: Secondary | ICD-10-CM

## 2018-05-01 DIAGNOSIS — C50411 Malignant neoplasm of upper-outer quadrant of right female breast: Secondary | ICD-10-CM | POA: Diagnosis not present

## 2018-05-01 DIAGNOSIS — Z170 Estrogen receptor positive status [ER+]: Secondary | ICD-10-CM | POA: Diagnosis not present

## 2018-05-01 DIAGNOSIS — Z800 Family history of malignant neoplasm of digestive organs: Secondary | ICD-10-CM

## 2018-05-01 DIAGNOSIS — Z5111 Encounter for antineoplastic chemotherapy: Secondary | ICD-10-CM

## 2018-05-01 DIAGNOSIS — Z801 Family history of malignant neoplasm of trachea, bronchus and lung: Secondary | ICD-10-CM

## 2018-05-01 DIAGNOSIS — D0512 Intraductal carcinoma in situ of left breast: Secondary | ICD-10-CM

## 2018-05-01 DIAGNOSIS — Z85828 Personal history of other malignant neoplasm of skin: Secondary | ICD-10-CM | POA: Diagnosis not present

## 2018-05-01 DIAGNOSIS — Z87891 Personal history of nicotine dependence: Secondary | ICD-10-CM | POA: Diagnosis not present

## 2018-05-01 LAB — CBC WITH DIFFERENTIAL/PLATELET
Basophils Absolute: 0 10*3/uL (ref 0–0.1)
Basophils Relative: 0 %
EOS PCT: 2 %
Eosinophils Absolute: 0 10*3/uL (ref 0–0.7)
HCT: 34.7 % — ABNORMAL LOW (ref 35.0–47.0)
Hemoglobin: 12.4 g/dL (ref 12.0–16.0)
LYMPHS ABS: 0.7 10*3/uL — AB (ref 1.0–3.6)
LYMPHS PCT: 25 %
MCH: 30.4 pg (ref 26.0–34.0)
MCHC: 35.6 g/dL (ref 32.0–36.0)
MCV: 85.5 fL (ref 80.0–100.0)
MONO ABS: 0.2 10*3/uL (ref 0.2–0.9)
MONOS PCT: 8 %
Neutro Abs: 1.9 10*3/uL (ref 1.4–6.5)
Neutrophils Relative %: 65 %
PLATELETS: 266 10*3/uL (ref 150–440)
RBC: 4.06 MIL/uL (ref 3.80–5.20)
RDW: 14.9 % — AB (ref 11.5–14.5)
WBC: 2.9 10*3/uL — ABNORMAL LOW (ref 3.6–11.0)

## 2018-05-01 LAB — COMPREHENSIVE METABOLIC PANEL
ALBUMIN: 3.8 g/dL (ref 3.5–5.0)
ALT: 25 U/L (ref 14–54)
AST: 28 U/L (ref 15–41)
Alkaline Phosphatase: 76 U/L (ref 38–126)
Anion gap: 8 (ref 5–15)
BILIRUBIN TOTAL: 0.5 mg/dL (ref 0.3–1.2)
BUN: 17 mg/dL (ref 6–20)
CHLORIDE: 109 mmol/L (ref 101–111)
CO2: 23 mmol/L (ref 22–32)
CREATININE: 0.77 mg/dL (ref 0.44–1.00)
Calcium: 8.8 mg/dL — ABNORMAL LOW (ref 8.9–10.3)
GFR calc Af Amer: >60 mL/min (ref >60)
GFR calc non Af Amer: >60 mL/min (ref >60)
GLUCOSE: 98 mg/dL (ref 65–99)
POTASSIUM: 3.7 mmol/L (ref 3.5–5.1)
Sodium: 140 mmol/L (ref 135–145)
Total Protein: 6.1 g/dL — ABNORMAL LOW (ref 6.5–8.1)

## 2018-05-01 LAB — MAGNESIUM: Magnesium: 1.9 mg/dL (ref 1.7–2.4)

## 2018-05-01 MED ORDER — HEPARIN SOD (PORK) LOCK FLUSH 100 UNIT/ML IV SOLN: 500.0000 [IU] | Freq: Once | INTRAVENOUS | Status: DC | PRN

## 2018-05-01 MED ORDER — DIPHENHYDRAMINE HCL 50 MG/ML IJ SOLN
50.0000 mg | Freq: Once | INTRAMUSCULAR | Status: AC
  Administered 2018-05-01: 50 mg via INTRAVENOUS
  Filled 2018-05-01: qty 1

## 2018-05-01 MED ORDER — FAMOTIDINE IN NACL 20-0.9 MG/50ML-% IV SOLN
20.0000 mg | Freq: Once | INTRAVENOUS | Status: AC
  Administered 2018-05-01: 20 mg via INTRAVENOUS
  Filled 2018-05-01: qty 50

## 2018-05-01 MED ORDER — SODIUM CHLORIDE 0.9 % IV SOLN
20.0000 mg | Freq: Once | INTRAVENOUS | Status: AC
  Administered 2018-05-01: 20 mg via INTRAVENOUS
  Filled 2018-05-01: qty 2

## 2018-05-01 MED ORDER — SODIUM CHLORIDE 0.9 % IV SOLN
80.0000 mg/m2 | Freq: Once | INTRAVENOUS | Status: AC
Start: 2018-05-01 — End: 2018-05-01
  Administered 2018-05-01: 144 mg via INTRAVENOUS
  Filled 2018-05-01: qty 24

## 2018-05-01 MED ORDER — SODIUM CHLORIDE 0.9 % IV SOLN
Freq: Once | INTRAVENOUS | Status: AC
  Administered 2018-05-01: 10:00:00 via INTRAVENOUS
  Filled 2018-05-01: qty 1000

## 2018-05-01 MED ORDER — SODIUM CHLORIDE 0.9% FLUSH
10.0000 mL | Freq: Once | INTRAVENOUS | Status: AC
  Administered 2018-05-01: 10 mL via INTRAVENOUS
  Filled 2018-05-01: qty 10

## 2018-05-01 MED ORDER — HEPARIN SOD (PORK) LOCK FLUSH 100 UNIT/ML IV SOLN
500.0000 [IU] | Freq: Once | INTRAVENOUS | Status: AC
  Administered 2018-05-01: 500 [IU] via INTRAVENOUS
  Filled 2018-05-01: qty 5

## 2018-05-01 NOTE — Progress Notes (Signed)
[facility] day:  05/01/2018   Chief Complaint: Megan Vega is a 62 y.o. female with stage IIIB right breast cancer and DCIS of the left breast who is seen for assessment prior to week #7 Taxol.  HPI: The patient was last seen in the medical oncology clinic on 04/17/2018.  At that time, she felt well.  Axillary abscess was well healed with no signs of infection.  Exam was stable.  WBC was 3100 ([place] 2000).  Potassium was 3.9.  B12 and folate were normal.  She received cycle #5 Taxol.  She received cycle #6 on 04/24/2018.  CBC revealed a hematocrit of 35.8, hemoglobin 12.4, MCV 85.6, platelets 261,000, WBC 2900 with an ANC of 1800  During the interim, she has felt good.  She denies any complaints.  She denies any numbness or tingling.  She is interested in reconstruction.   Past Medical History:  Diagnosis Date  . Ankle fracture   . Cancer [facility])    Basal Cell Carcinoma, about 2011 chest  . Cataract   . Family history of adverse reaction to anesthesia    DAUGHTER-N/V  . Hemorrhoids   . History of methicillin resistant staphylococcus aureus (MRSA) 2015  . Migraines    MIGRAINES    Past Surgical History:  Procedure Laterality Date  . BREAST BIOPSY Bilateral 10/29/2017   L breast DCIS, R breast invasive mammary carcinoma of no special type, ER/PR+, Her2/neu 1+  . CESAREAN SECTION    . COLONOSCOPY  2012  . IRRIGATION AND DEBRIDEMENT ABSCESS Right 03/12/2018   Procedure: IRRIGATION AND DEBRIDEMENT RIGHT AXILLARY ABSCESS;  Surgeon: Robert Bellow, MD;  Location: [facility] ORS;  Service: General;  Laterality: Right;  . MANDIBLE FRACTURE SURGERY    . PORTACATH PLACEMENT Left 11/28/2017   Procedure: INSERTION PORT-A-CATH;  Surgeon: Robert Bellow, MD;  Location: [facility] ORS;  Service: General;  Laterality: Left;    Family History  Problem Relation Age of Onset  . Cancer Mother        Esophageal Cancer  . Early death Mother        Age 62    . Cancer Father        Lung Cancer  . Diabetes Father   . Diabetes Brother        Controlled with diet  . Heart attack Paternal Grandfather     Social History:  reports that she quit smoking about 41 years ago. She has a 2.50 pack-year smoking history. She has never used smokeless tobacco. She reports that she drinks about 0.6 oz of alcohol per week. She reports that she does not use drugs.  Patient is a former smoker for 20 years; only smoked "about 3 cigarettes a day"; stopped 30 years ago. Patient drinks alcohol very infrequently; "about 1 glass a month". Patient currently employed as an Web designer at [employer] She denies any known exposures to radiations or toxins.  She lives in [place].  Her grand daughter is graduating in 03/2018.  She has one daughter [name]).  She is alone  today.  Allergies:  Allergies  Allergen Reactions  . Peanut-Containing Drug Products Swelling and Other (See Comments)    Only when eating too many peanuts, swelling with lips and tingly face    Current Medications: Current Outpatient Medications  Medication Sig Dispense Refill  . acetaminophen (TYLENOL) 500 MG tablet Take 1,000 mg by mouth every 6 (six) hours as needed for moderate pain or  headache.    . b complex vitamins tablet Take 1 tablet by mouth daily.    Marland Kitchen lidocaine-prilocaine (EMLA) cream Apply to affected area once 30 g 3  . loratadine (CLARITIN) 10 MG tablet Take 10 mg by mouth daily.    . Multiple Vitamins-Minerals (HAIR/SKIN/NAILS/BIOTIN PO) Take 1 tablet by mouth daily.     . polyethylene glycol (MIRALAX / GLYCOLAX) packet Take 17 g by mouth daily as needed for mild constipation.    Marland Kitchen LORazepam (ATIVAN) 0.5 MG tablet Take 1 tablet (0.5 mg total) by mouth every 6 (six) hours as needed (Nausea or vomiting). 30 tablet 0  . ondansetron (ZOFRAN) 8 MG tablet Take 1 tablet (8 mg total) by mouth 2 (two) times daily as needed. Start on the third day after chemotherapy. 30  tablet 1  . potassium chloride SA (K-DUR,KLOR-CON) 20 MEQ tablet Take 1 tablet (20 mEq total) by mouth 2 (two) times daily. (Patient not taking: Reported on 05/15/2018) 14 tablet 0   No current facility-administered medications for this visit.    Facility-Administered Medications Ordered in Other Visits  Medication Dose Route Frequency Provider Last Rate Last Dose  . heparin lock flush 100 unit/mL  500 Units Intracatheter PRN Lequita Asal, MD        Review of Systems  Constitutional: Negative for diaphoresis, fever, malaise/fatigue and weight loss (up 3 pounds).       Feels good.  HENT: Negative.  Negative for congestion, ear pain, hearing loss, nosebleeds, sinus pain and sore throat.   Eyes: Negative.  Negative for blurred vision, pain, discharge and redness.  Respiratory: Negative for cough, hemoptysis, sputum production and shortness of breath.   Cardiovascular: Negative.  Negative for chest pain, palpitations, orthopnea, leg swelling and PND.  Gastrointestinal: Negative.  Negative for abdominal pain, blood in stool, constipation, diarrhea, melena, nausea and vomiting.  Genitourinary: Negative.  Negative for dysuria, frequency, hematuria and urgency.  Musculoskeletal: Negative.  Negative for back pain, falls, joint pain and myalgias.  Skin: Negative.  Negative for itching and rash.       RIGHT axilla healed   Neurological: Negative for dizziness, tingling, tremors (essential), focal weakness, weakness and headaches (migraines; chronic).  Endo/Heme/Allergies: Negative.  Does not bruise/bleed easily.  Psychiatric/Behavioral: Negative for depression, memory loss and suicidal ideas. The patient is not nervous/anxious and does not have insomnia.   All other systems reviewed and are negative.  Vitals signs: BP 124/82 (BP Location: Left Arm, Patient Position: Sitting)   Pulse 80   Temp 97.8 F (36.6 C) (Tympanic)   Resp 16   Wt 153 lb 1.6 oz (69.4 kg)   BMI 25.48 kg/m    Physical Exam  Constitutional: She is oriented to person, place, and time and well-developed, well-nourished, and in no distress. No distress.  HENT:  Head: Normocephalic and atraumatic. Hair is abnormal (chemotherapy induced alopecia; short strawberry blonde wig).  Mouth/Throat: Oropharynx is clear and moist.  Eyes: Pupils are equal, round, and reactive to light. Conjunctivae and EOM are normal. No scleral icterus.  Hazel/green eyes  Neck: Normal range of motion. Neck supple. No JVD present. No tracheal deviation present.  Cardiovascular: Normal rate, regular rhythm and normal heart sounds. Exam reveals no gallop and no friction rub.  No murmur heard. Pulmonary/Chest: Effort normal and breath sounds normal. No respiratory distress. She has no wheezes. She has no rales.  Abdominal: Soft. Bowel sounds are normal. She exhibits no mass. There is no tenderness.  Musculoskeletal: Normal range of  motion. She exhibits no edema or tenderness.  Lymphadenopathy:       Head (right side): No submandibular, no preauricular, no posterior auricular and no occipital adenopathy present.       Head (left side): No submandibular, no preauricular, no posterior auricular and no occipital adenopathy present.    She has no cervical adenopathy.    She has no axillary adenopathy.       Right: No inguinal adenopathy present.       Left: No inguinal adenopathy present.  Neurological: She is alert and oriented to person, place, and time. Gait normal.  Skin: Skin is warm, dry and intact. No rash noted. She is not diaphoretic. No pallor.     Right axillary incision well healed without erythema, increased warmth or tenderness.  Psychiatric: Mood, affect and judgment normal.   No results found for this or any previous visit (from the past 24 hour(s)).   Assessment:  Megan Vega is a 62 y.o. female with clinical stage [id number] right breast cancer and DCIS in the left breast s/p ultrasound guided biopsy on  10/29/2017. Pathology from the right breast at the 10 o'clock position 3 cm from the nipple revealed a 1.4 cm grade II invasive mammary carcinoma of no special type.  Tumor was ER + (> 90%), PR + (90%) and Her2/neu 1+.  Right axillary node biopsy revealed metastatic carcinoma.  Left breast biopsy at the 2 o'clock position 3 cm from the nipple revealed high grade DCIS with comedonecrosis.  CA27.29 was 23.5 on 11/14/2017.  Bilateral diagnostic mammogram and ultrasound on 10/23/2017 revealed a 3.9 x 1.7 x 2.2 cm irregular hypoechoic mass with associated areas of vascularity at the 10 o'clock position 3 cm from the nipple and a 1.1 x 0.5 x 0.4 cm area of nodularity (3 approximately 3 mm nodules at 10 o'clock 6 cm from the nipple in the right breast. The mass was within a 5 cm area of pleomorphic microcalcifications.  Ultrasound revealed at least 5 prominent lymph nodes with diffuse thickened and hypoechoic cortices suggestive of metastatic disease.  In the left breast, there was an irregular 1.6 x 1.3 x 1.2 cm hypoechoic mass with indistinct margins at 2 o'clock 3 cm from the nipple.  Axillary ultrasound revealed normal nodes.  Bilateral breast MRI on 11/17/2017 revealed a 4.3 x 4.8 x 5 cm mass on the right centered in the upper outer quadrant but also extends into the lower outer and upper inner quadrants.  There was a small amount of enhancement posterior to the right nipple which could represent subtle extension. There was a cluster of nodules located posterior to the superior aspect of the known malignancy and another small nodule located posterior to the inferior aspect of the malignancy which could represent satellite lesions. Taking the enhancement posterior to the right nipple and the potential posterior satellite lesions into account, the AP dimension could be as large as 7 cm.  The patient's known DCIS spans 5 x 5 x 4.4 cm mammographically based on calcifications. The associated enhancement spans 3.2 x 3.2  x 3.3 cm. However, there were 2 small linearly enhancing regions at 6 o'clock in the left breast which could represent further disease.  There was extensive adenopathy in the right axilla and right retropectoral region. There was at least 1 significantly enlarged right internal mammary lymph node.  PET scan on 11/24/2017 revealed hypermetabolic right axillary (2.6 x 3.1 cm; SUV 8.0), subpectoral/internal mammary (7 mm; SUV 2.4) adenopathy.  There was  a 4 mm right lower lobe nodule is too small for PET resolution.  There was cholelithiasis.  She received 4 cycles of AC (12/05/2017 - 01/30/2018) with OnPro Neulasta support.  She is s/p 6 weeks of Taxol (02/13/2018 - 02/27/2018; 04/10/2018 - 04/24/2018).  Week #4 was delayed secondary to a right axillary abscess and neutropenia.   Breast ultrasound on 02/18/2018 revealed a 1.0 x 1.36 x 2.14 cm dominant mass in the upper outer quadrant of the right breast (>50% volume decrease).  Axillary lymph nodes showed marked improvement with only 2 of  5 previous identified  lymph nodes identifiable (largest 1.3 cm; smaller node with cortical thickness 0.39 compared to 0.99).  Bone density on 12/16/2016 revealed osteopenia with a T-score of -1.6 in the left femoral neck on 12/16/2017.  She developed a right axillary abscess and underwent incision and drainage on 03/12/2018.  Culture grew staph aureus.  She completed Bactrim on 03/26/2018.  Symptomatically, she feels well.  Exam is stable.  WBC 2900 ([place] 1900).  Potassium is 3.9.  Plan: 1.  Labs today:  CBC with diff, CMP, Mg.  2.  Labs reviewed.  Blood counts stable and adequate enough for treatment. Will proceed with week #7 Taxol today. 3.  Discuss plans for repeat ultrasound with Dr Bary Castilla around 05/14/2018. 4.  Assist with appointment with plastic surgery (Dr Marla Roe). 5.  RTC in 1 week for labs (CBC with diff, CMP, Mg) and week #8 Taxol 6.  RTC in 2 weeks for MD assessment, labs (CBC with diff, CMP,  Mg) and week #9 Taxol.   Lequita Asal, MD 05/01/2018, 5:35 PM

## 2018-05-06 ENCOUNTER — Telehealth: Payer: Self-pay | Admitting: *Deleted

## 2018-05-06 NOTE — Telephone Encounter (Signed)
Called and left a message on patient's vmail making her aware of the  Scheduled date and time of her appt. with Dr.Byrnett on 05/14/18

## 2018-05-08 ENCOUNTER — Inpatient Hospital Stay: Payer: 59

## 2018-05-08 ENCOUNTER — Other Ambulatory Visit: Payer: Self-pay | Admitting: Hematology and Oncology

## 2018-05-08 VITALS — BP 130/80 | HR 87 | Temp 98.6°F | Resp 16 | Wt 153.2 lb

## 2018-05-08 DIAGNOSIS — C50411 Malignant neoplasm of upper-outer quadrant of right female breast: Secondary | ICD-10-CM | POA: Diagnosis not present

## 2018-05-08 LAB — CBC WITH DIFFERENTIAL/PLATELET
Basophils Absolute: 0.1 10*3/uL (ref 0–0.1)
Basophils Relative: 2 %
Eosinophils Absolute: 0.1 10*3/uL (ref 0–0.7)
Eosinophils Relative: 2 %
HCT: 34.4 % — ABNORMAL LOW (ref 35.0–47.0)
Hemoglobin: 12.1 g/dL (ref 12.0–16.0)
Lymphocytes Relative: 21 %
Lymphs Abs: 0.6 10*3/uL — ABNORMAL LOW (ref 1.0–3.6)
MCH: 29.8 pg (ref 26.0–34.0)
MCHC: 35.3 g/dL (ref 32.0–36.0)
MCV: 84.4 fL (ref 80.0–100.0)
Monocytes Absolute: 0.4 10*3/uL (ref 0.2–0.9)
Monocytes Relative: 12 %
Neutro Abs: 1.9 10*3/uL (ref 1.4–6.5)
Neutrophils Relative %: 63 %
Platelets: 258 10*3/uL (ref 150–440)
RBC: 4.07 MIL/uL (ref 3.80–5.20)
RDW: 14.9 % — ABNORMAL HIGH (ref 11.5–14.5)
WBC: 3 10*3/uL — ABNORMAL LOW (ref 3.6–11.0)

## 2018-05-08 LAB — COMPREHENSIVE METABOLIC PANEL
ALT: 30 U/L (ref 14–54)
AST: 33 U/L (ref 15–41)
Albumin: 3.8 g/dL (ref 3.5–5.0)
Alkaline Phosphatase: 78 U/L (ref 38–126)
Anion gap: 4 — ABNORMAL LOW (ref 5–15)
BUN: 14 mg/dL (ref 6–20)
CO2: 25 mmol/L (ref 22–32)
Calcium: 8.9 mg/dL (ref 8.9–10.3)
Chloride: 109 mmol/L (ref 101–111)
Creatinine, Ser: 0.87 mg/dL (ref 0.44–1.00)
GFR calc Af Amer: >60 mL/min (ref >60)
GFR calc non Af Amer: >60 mL/min (ref >60)
Glucose, Bld: 121 mg/dL — ABNORMAL HIGH (ref 65–99)
Potassium: 3.7 mmol/L (ref 3.5–5.1)
Sodium: 138 mmol/L (ref 135–145)
Total Bilirubin: 0.5 mg/dL (ref 0.3–1.2)
Total Protein: 6.4 g/dL — ABNORMAL LOW (ref 6.5–8.1)

## 2018-05-08 LAB — MAGNESIUM: Magnesium: 1.8 mg/dL (ref 1.7–2.4)

## 2018-05-08 MED ORDER — SODIUM CHLORIDE 0.9 % IV SOLN
Freq: Once | INTRAVENOUS | Status: AC
  Administered 2018-05-08: 10:00:00 via INTRAVENOUS
  Filled 2018-05-08: qty 1000

## 2018-05-08 MED ORDER — FAMOTIDINE IN NACL 20-0.9 MG/50ML-% IV SOLN
20.0000 mg | Freq: Once | INTRAVENOUS | Status: AC
  Administered 2018-05-08: 20 mg via INTRAVENOUS
  Filled 2018-05-08: qty 50

## 2018-05-08 MED ORDER — SODIUM CHLORIDE 0.9 % IV SOLN
80.0000 mg/m2 | Freq: Once | INTRAVENOUS | Status: AC
  Administered 2018-05-08: 144 mg via INTRAVENOUS
  Filled 2018-05-08: qty 24

## 2018-05-08 MED ORDER — DIPHENHYDRAMINE HCL 50 MG/ML IJ SOLN
50.0000 mg | Freq: Once | INTRAMUSCULAR | Status: AC
  Administered 2018-05-08: 50 mg via INTRAVENOUS
  Filled 2018-05-08: qty 1

## 2018-05-08 MED ORDER — HEPARIN SOD (PORK) LOCK FLUSH 100 UNIT/ML IV SOLN
500.0000 [IU] | Freq: Once | INTRAVENOUS | Status: AC
  Administered 2018-05-08: 500 [IU] via INTRAVENOUS
  Filled 2018-05-08: qty 5

## 2018-05-08 MED ORDER — SODIUM CHLORIDE 0.9% FLUSH
10.0000 mL | INTRAVENOUS | Status: DC | PRN
  Administered 2018-05-08: 10 mL via INTRAVENOUS
  Filled 2018-05-08: qty 10

## 2018-05-08 MED ORDER — SODIUM CHLORIDE 0.9 % IV SOLN
20.0000 mg | Freq: Once | INTRAVENOUS | Status: AC
  Administered 2018-05-08: 20 mg via INTRAVENOUS
  Filled 2018-05-08: qty 2

## 2018-05-13 ENCOUNTER — Ambulatory Visit
Admission: RE | Admit: 2018-05-13 | Discharge: 2018-05-13 | Disposition: A | Discharge status: Home / Self Care | Payer: Self-pay | Source: Ambulatory Visit | Attending: General Surgery | Admitting: General Surgery

## 2018-05-13 ENCOUNTER — Other Ambulatory Visit: Payer: Self-pay | Admitting: General Surgery

## 2018-05-13 DIAGNOSIS — R2231 Localized swelling, mass and lump, right upper limb: Secondary | ICD-10-CM

## 2018-05-14 ENCOUNTER — Ambulatory Visit: Payer: Self-pay

## 2018-05-14 ENCOUNTER — Ambulatory Visit (INDEPENDENT_AMBULATORY_CARE_PROVIDER_SITE_OTHER): Payer: 59 | Admitting: General Surgery

## 2018-05-14 ENCOUNTER — Encounter: Payer: Self-pay | Admitting: General Surgery

## 2018-05-14 VITALS — BP 130/74 | HR 76 | Resp 12 | Ht 65.0 in | Wt 152.0 lb

## 2018-05-14 DIAGNOSIS — C50411 Malignant neoplasm of upper-outer quadrant of right female breast: Secondary | ICD-10-CM | POA: Diagnosis not present

## 2018-05-14 NOTE — Progress Notes (Signed)
Patient ID: Megan Vega, female   DOB: May 01, 1956, 63 y.o.   MRN: [id number]  Chief Complaint  [name]    HPI Megan Vega is a 62 y.o. female here today for a right breast ultrasound. She states she had a right axillary abscess about two months ago. The area is now asymptomatic.  Follow-up visit to assess tumor size with the patient undergoing neoadjuvant chemotherapy.  Daughter is present at visit.   Past Medical History:  Diagnosis Date  . Ankle fracture   . Cancer [facility])    Basal Cell Carcinoma, about 2011 chest  . Cataract   . Family history of adverse reaction to anesthesia    DAUGHTER-N/V  . Hemorrhoids   . History of methicillin resistant staphylococcus aureus (MRSA) 2015  . Migraines    MIGRAINES    Past Surgical History:  Procedure Laterality Date  . BREAST BIOPSY Bilateral 10/29/2017   L breast DCIS, R breast invasive mammary carcinoma of no special type, ER/PR+, Her2/neu 1+  . CESAREAN SECTION    . COLONOSCOPY  2012  . IRRIGATION AND DEBRIDEMENT ABSCESS Right 03/12/2018   Procedure: IRRIGATION AND DEBRIDEMENT RIGHT AXILLARY ABSCESS;  Surgeon: Robert Bellow, MD;  Location: [facility] ORS;  Service: General;  Laterality: Right;  . MANDIBLE FRACTURE SURGERY    . PORTACATH PLACEMENT Left 11/28/2017   Procedure: INSERTION PORT-A-CATH;  Surgeon: Robert Bellow, MD;  Location: [facility] ORS;  Service: General;  Laterality: Left;    Family History  Problem Relation Age of Onset  . Cancer Mother        Esophageal Cancer  . Early death Mother        Age 94  . Cancer Father        Lung Cancer  . Diabetes Father   . Diabetes Brother        Controlled with diet  . Heart attack Paternal Grandfather     Social History Social History   Tobacco Use  . Smoking status: Former Smoker    Packs/day: 0.25    Years: 10.00    Pack years: 2.50    Last attempt to quit: 1978    Years since quitting: 41.5  . Smokeless tobacco: Never Used   Substance Use Topics  . Alcohol use: Yes    Alcohol/week: 0.6 oz    Types: 1 Glasses of wine per week    Comment: RARE  . Drug use: No    Allergies  Allergen Reactions  . Peanut-Containing Drug Products Swelling and Other (See Comments)    Only when eating too many peanuts, swelling with lips and tingly face    Current Outpatient Medications  Medication Sig Dispense Refill  . acetaminophen (TYLENOL) 500 MG tablet Take 1,000 mg by mouth every 6 (six) hours as needed for moderate pain or headache.    . b complex vitamins tablet Take 1 tablet by mouth daily.    Marland Kitchen lidocaine-prilocaine (EMLA) cream Apply to affected area once 30 g 3  . loratadine (CLARITIN) 10 MG tablet Take 10 mg by mouth daily.    Marland Kitchen LORazepam (ATIVAN) 0.5 MG tablet Take 1 tablet (0.5 mg total) by mouth every 6 (six) hours as needed (Nausea or vomiting). 30 tablet 0  . Multiple Vitamins-Minerals (HAIR/SKIN/NAILS/BIOTIN PO) Take 1 tablet by mouth daily.     . ondansetron (ZOFRAN) 8 MG tablet Take 1 tablet (8 mg total) by mouth 2 (two) times daily as needed. Start on the third day after  chemotherapy. 30 tablet 1  . polyethylene glycol (MIRALAX / GLYCOLAX) packet Take 17 g by mouth daily as needed for mild constipation.    . potassium chloride SA (K-DUR,KLOR-CON) 20 MEQ tablet Take 1 tablet (20 mEq total) by mouth 2 (two) times daily. (Patient not taking: Reported on 05/15/2018) 14 tablet 0   No current facility-administered medications for this visit.    Facility-Administered Medications Ordered in Other Visits  Medication Dose Route Frequency Provider Last Rate Last Dose  . heparin lock flush 100 unit/mL  500 Units Intracatheter PRN Lequita Asal, MD        Review of Systems Review of Systems  Constitutional: Negative.   Respiratory: Negative.   Cardiovascular: Negative.     Blood pressure 130/74, pulse 76, resp. rate 12, height 5' 5"  (1.651 m), weight 152 lb (68.9 kg).  Physical Exam Physical Exam   Constitutional: She is oriented to person, place, and time. She appears well-developed and well-nourished.  HENT:  Mouth/Throat: Oropharynx is clear and moist.  Eyes: Conjunctivae are normal. No scleral icterus.  Neck: Neck supple.  Cardiovascular: Normal rate, regular rhythm and normal heart sounds.  Pulmonary/Chest: Effort normal and breath sounds normal.    Lymphadenopathy:    She has no cervical adenopathy.    She has no axillary adenopathy.  Neurological: She is alert and oriented to person, place, and time.  Skin: Skin is warm and dry.  Psychiatric: Her behavior is normal.   No visible asymmetry of the upper extremities are noted today.  Data Reviewed Ultrasound examination of the right breast in the 10 o'clock position, 4 cm from the nipple shows a persistent mass-effect measuring 0.85 x 1.27 x 1.21 cm.  Adjacent to this at the 930 o'clock position approximately 5 cm from the nipple is a small nodular area measuring 0.6 x 0.68 x 0.6 cm.  This represent a 56% reduction in size since her exam 2 months ago.  In the right axilla the dominant lymph node on level with the manubrium measures 1.1 x 1.2 x 1.52 cm.  The smaller node 1 cm above this measures 0.99 cm.  The main tumor mass measured 3.9 cm in December 2018.  Assessment    Continued improvement with neoadjuvant chemotherapy for invasive mammary carcinoma of the right breast.  Known DCIS of the left breast.    Plan  Patient to return after completion of neoadjuvant chemotherapy.  The area of DCIS in the left breast has been estimated to be about 5 x 5 x 4 cm based on my mammogram.  The area was even more prominent on MRI.  While its possible this could undergo breast conservation, this is a significant volume of tissue for a woman with a B/C cup breast.  The patient expressed a desire to avoid radiation, and in this case she will be best managed by bilateral mastectomy.  She is unsure whether she would like to  undergo reconstruction at that time, but has been encouraged to meet with the plastic surgeon in this regard.  Plastic surgical consultation with clear dilling him, DO is in progress.  The patient is aware to call back for any questions or concerns.   HPI, Physical Exam, Assessment and Plan have been scribed under the direction and in the presence of Robert Bellow, MD. Karie Fetch, RN  I have completed the exam and reviewed the above documentation for accuracy and completeness.  I agree with the above.  Haematologist has been used and  any errors in dictation or transcription are unintentional.  Hervey Ard, M.D., F.A.C.S.  Forest Gleason Cande Mastropietro 05/15/2018, 7:28 PM

## 2018-05-14 NOTE — Patient Instructions (Signed)
Patient to return after Dr. Mike Gip calls. The patient is aware to call back for any questions or concerns.

## 2018-05-15 ENCOUNTER — Inpatient Hospital Stay: Payer: 59

## 2018-05-15 ENCOUNTER — Other Ambulatory Visit: Payer: Self-pay

## 2018-05-15 ENCOUNTER — Encounter: Payer: Self-pay | Admitting: Hematology and Oncology

## 2018-05-15 ENCOUNTER — Inpatient Hospital Stay (HOSPITAL_BASED_OUTPATIENT_CLINIC_OR_DEPARTMENT_OTHER): Payer: 59 | Admitting: Hematology and Oncology

## 2018-05-15 VITALS — BP 118/75 | HR 83 | Temp 97.1°F | Resp 18 | Wt 151.6 lb

## 2018-05-15 DIAGNOSIS — Z170 Estrogen receptor positive status [ER+]: Secondary | ICD-10-CM | POA: Diagnosis not present

## 2018-05-15 DIAGNOSIS — G620 Drug-induced polyneuropathy: Secondary | ICD-10-CM | POA: Diagnosis not present

## 2018-05-15 DIAGNOSIS — Z801 Family history of malignant neoplasm of trachea, bronchus and lung: Secondary | ICD-10-CM | POA: Diagnosis not present

## 2018-05-15 DIAGNOSIS — Z79899 Other long term (current) drug therapy: Secondary | ICD-10-CM | POA: Diagnosis not present

## 2018-05-15 DIAGNOSIS — Z87891 Personal history of nicotine dependence: Secondary | ICD-10-CM | POA: Diagnosis not present

## 2018-05-15 DIAGNOSIS — C50411 Malignant neoplasm of upper-outer quadrant of right female breast: Secondary | ICD-10-CM

## 2018-05-15 DIAGNOSIS — Z85828 Personal history of other malignant neoplasm of skin: Secondary | ICD-10-CM

## 2018-05-15 DIAGNOSIS — Z800 Family history of malignant neoplasm of digestive organs: Secondary | ICD-10-CM

## 2018-05-15 DIAGNOSIS — Z7189 Other specified counseling: Secondary | ICD-10-CM

## 2018-05-15 DIAGNOSIS — T451X5A Adverse effect of antineoplastic and immunosuppressive drugs, initial encounter: Secondary | ICD-10-CM | POA: Diagnosis not present

## 2018-05-15 DIAGNOSIS — Z5111 Encounter for antineoplastic chemotherapy: Secondary | ICD-10-CM

## 2018-05-15 LAB — CBC WITH DIFFERENTIAL/PLATELET
Basophils Absolute: 0.1 10*3/uL (ref 0–0.1)
Basophils Relative: 3 %
Eosinophils Absolute: 0.1 10*3/uL (ref 0–0.7)
Eosinophils Relative: 2 %
HCT: 34.9 % — ABNORMAL LOW (ref 35.0–47.0)
Hemoglobin: 12 g/dL (ref 12.0–16.0)
Lymphocytes Relative: 26 %
Lymphs Abs: 0.8 10*3/uL — ABNORMAL LOW (ref 1.0–3.6)
MCH: 29.2 pg (ref 26.0–34.0)
MCHC: 34.4 g/dL (ref 32.0–36.0)
MCV: 85.1 fL (ref 80.0–100.0)
Monocytes Absolute: 0.3 10*3/uL (ref 0.2–0.9)
Monocytes Relative: 11 %
Neutro Abs: 1.9 10*3/uL (ref 1.4–6.5)
Neutrophils Relative %: 58 %
Platelets: 310 10*3/uL (ref 150–440)
RBC: 4.1 MIL/uL (ref 3.80–5.20)
RDW: 14.9 % — ABNORMAL HIGH (ref 11.5–14.5)
WBC: 3.2 10*3/uL — ABNORMAL LOW (ref 3.6–11.0)

## 2018-05-15 LAB — COMPREHENSIVE METABOLIC PANEL
ALT: 23 U/L (ref 0–44)
AST: 27 U/L (ref 15–41)
Albumin: 3.7 g/dL (ref 3.5–5.0)
Alkaline Phosphatase: 74 U/L (ref 38–126)
Anion gap: 5 (ref 5–15)
BUN: 13 mg/dL (ref 8–23)
CO2: 25 mmol/L (ref 22–32)
Calcium: 8.8 mg/dL — ABNORMAL LOW (ref 8.9–10.3)
Chloride: 110 mmol/L (ref 98–111)
Creatinine, Ser: 0.86 mg/dL (ref 0.44–1.00)
GFR calc Af Amer: >60 mL/min (ref >60)
GFR calc non Af Amer: >60 mL/min (ref >60)
Glucose, Bld: 119 mg/dL — ABNORMAL HIGH (ref 70–99)
Potassium: 3.8 mmol/L (ref 3.5–5.1)
Sodium: 140 mmol/L (ref 135–145)
Total Bilirubin: 0.5 mg/dL (ref 0.3–1.2)
Total Protein: 6.2 g/dL — ABNORMAL LOW (ref 6.5–8.1)

## 2018-05-15 LAB — MAGNESIUM: Magnesium: 1.9 mg/dL (ref 1.7–2.4)

## 2018-05-15 MED ORDER — DIPHENHYDRAMINE HCL 50 MG/ML IJ SOLN
50.0000 mg | Freq: Once | INTRAMUSCULAR | Status: AC
  Administered 2018-05-15: 50 mg via INTRAVENOUS
  Filled 2018-05-15: qty 1

## 2018-05-15 MED ORDER — SODIUM CHLORIDE 0.9 % IV SOLN
20.0000 mg | Freq: Once | INTRAVENOUS | Status: AC
  Administered 2018-05-15: 20 mg via INTRAVENOUS
  Filled 2018-05-15: qty 2

## 2018-05-15 MED ORDER — SODIUM CHLORIDE 0.9 % IV SOLN
Freq: Once | INTRAVENOUS | Status: AC
  Administered 2018-05-15: 10:00:00 via INTRAVENOUS
  Filled 2018-05-15: qty 1000

## 2018-05-15 MED ORDER — FAMOTIDINE IN NACL 20-0.9 MG/50ML-% IV SOLN
20.0000 mg | Freq: Once | INTRAVENOUS | Status: AC
  Administered 2018-05-15: 20 mg via INTRAVENOUS
  Filled 2018-05-15: qty 50

## 2018-05-15 MED ORDER — SODIUM CHLORIDE 0.9 % IV SOLN
80.0000 mg/m2 | Freq: Once | INTRAVENOUS | Status: AC
  Administered 2018-05-15: 144 mg via INTRAVENOUS
  Filled 2018-05-15: qty 24

## 2018-05-15 MED ORDER — HEPARIN SOD (PORK) LOCK FLUSH 100 UNIT/ML IV SOLN
500.0000 [IU] | Freq: Once | INTRAVENOUS | Status: AC
  Administered 2018-05-15: 500 [IU] via INTRAVENOUS
  Filled 2018-05-15: qty 5

## 2018-05-15 MED ORDER — SODIUM CHLORIDE 0.9% FLUSH
10.0000 mL | INTRAVENOUS | Status: DC | PRN
  Administered 2018-05-15: 10 mL via INTRAVENOUS
  Filled 2018-05-15: qty 10

## 2018-05-15 NOTE — Progress Notes (Signed)
Patient here for follow up. No concerns voiced.  °

## 2018-05-15 NOTE — Progress Notes (Signed)
Megan Vega day:  05/15/2018   Chief Complaint: Megan Vega is a 62 y.o. female with stage IIIB right breast cancer and DCIS of the left breast who is seen for assessment prior to week #9 Taxol.  HPI: The patient was last seen in the medical oncology [facility] on 05/01/2018.  At that time, she felt good.  She denied any complaints.  Exam was stable.  WBC was 2900 ([place] 1900).  Potassium was 3.7.  She received cycle #7 Taxol.  She received cycle #8 on 05/08/2018.  CBC revealed a hematocrit of 34.4, hemoglobin 12.1, MCV 84.4, platelets 258,000, WBC 3000 with an ANC of 1900.  She was seen by Dr. Bary Vega on 05/14/2018.  Follow-up breast ultrasound revealed a 56% reduction in the size of the mass.  During the interim, patient is doing well. She has stable grade I neuropathy in her fingertips and toes. She denies any other complaints. Patient denies that she has experienced any B symptoms. She denies any interval infections.   Patient advises that she maintains an adequate appetite. She is eating well. Weight today is 151 lb 9.6 oz (68.8 kg), which compared to her last visit to the [facility], represents a  2 pound loss.   Patient denies pain in the [facility] today.   Past Medical History:  Diagnosis Date  . Ankle fracture   . Cancer [facility])    Basal Cell Carcinoma, about 2011 chest  . Cataract   . Family history of adverse reaction to anesthesia    DAUGHTER-N/V  . Hemorrhoids   . History of methicillin resistant staphylococcus aureus (MRSA) 2015  . Migraines    MIGRAINES    Past Surgical History:  Procedure Laterality Date  . BREAST BIOPSY Bilateral 10/29/2017   L breast DCIS, R breast invasive mammary carcinoma of no special type, ER/PR+, Her2/neu 1+  . CESAREAN SECTION    . COLONOSCOPY  2012  . IRRIGATION AND DEBRIDEMENT ABSCESS Right 03/12/2018   Procedure: IRRIGATION AND DEBRIDEMENT RIGHT AXILLARY ABSCESS;  Surgeon: Robert Bellow, MD;   Location: [facility] ORS;  Service: General;  Laterality: Right;  . MANDIBLE FRACTURE SURGERY    . PORTACATH PLACEMENT Left 11/28/2017   Procedure: INSERTION PORT-A-CATH;  Surgeon: Robert Bellow, MD;  Location: [facility] ORS;  Service: General;  Laterality: Left;    Family History  Problem Relation Age of Onset  . Cancer Mother        Esophageal Cancer  . Early death Mother        Age 68  . Cancer Father        Lung Cancer  . Diabetes Father   . Diabetes Brother        Controlled with diet  . Heart attack Paternal Grandfather     Social History:  reports that she quit smoking about 41 years ago. She has a 2.50 pack-year smoking history. She has never used smokeless tobacco. She reports that she drinks about 0.6 oz of alcohol per week. She reports that she does not use drugs.  Patient is a former smoker for 20 years; only smoked "about 3 cigarettes a day"; stopped 30 years ago. Patient drinks alcohol very infrequently; "about 1 glass a month". Patient currently employed as an Web designer at [employer] She denies any known exposures to radiations or toxins.  She lives in [place].  Her grand daughter is graduating in 03/2018.  She has one daughter Megan Vega).  She is accompanied  by her daughter  today.  Allergies:  Allergies  Allergen Reactions  . Peanut-Containing Drug Products Swelling and Other (See Comments)    Only when eating too many peanuts, swelling with lips and tingly face    Current Medications: Current Outpatient Medications  Medication Sig Dispense Refill  . acetaminophen (TYLENOL) 500 MG tablet Take 1,000 mg by mouth every 6 (six) hours as needed for moderate pain or headache.    . b complex vitamins tablet Take 1 tablet by mouth daily.    Marland Kitchen lidocaine-prilocaine (EMLA) cream Apply to affected area once 30 g 3  . loratadine (CLARITIN) 10 MG tablet Take 10 mg by mouth daily.    Marland Kitchen LORazepam (ATIVAN) 0.5 MG tablet Take 1 tablet (0.5 mg total) by mouth  every 6 (six) hours as needed (Nausea or vomiting). 30 tablet 0  . Multiple Vitamins-Minerals (HAIR/SKIN/NAILS/BIOTIN PO) Take 1 tablet by mouth daily.     . ondansetron (ZOFRAN) 8 MG tablet Take 1 tablet (8 mg total) by mouth 2 (two) times daily as needed. Start on the third day after chemotherapy. 30 tablet 1  . polyethylene glycol (MIRALAX / GLYCOLAX) packet Take 17 g by mouth daily as needed for mild constipation.    . potassium chloride SA (K-DUR,KLOR-CON) 20 MEQ tablet Take 1 tablet (20 mEq total) by mouth 2 (two) times daily. (Patient not taking: Reported on 05/15/2018) 14 tablet 0   No current facility-administered medications for this visit.    Facility-Administered Medications Ordered in Other Visits  Medication Dose Route Frequency Provider Last Rate Last Dose  . heparin lock flush 100 unit/mL  500 Units Intracatheter PRN Corcoran, Melissa C, MD      . heparin lock flush 100 unit/mL  500 Units Intravenous Once Corcoran, Melissa C, MD      . sodium chloride flush (NS) 0.9 % injection 10 mL  10 mL Intravenous PRN Lequita Asal, MD   10 mL at 05/15/18 0805    Review of Systems  Constitutional: Positive for weight loss (down 2 pounds). Negative for diaphoresis, fever and malaise/fatigue.       "I feel really good".   HENT: Negative.  Negative for congestion, ear pain, hearing loss, nosebleeds, sinus pain and sore throat.   Eyes: Negative.  Negative for blurred vision, pain, discharge and redness.  Respiratory: Negative for cough, hemoptysis, sputum production and shortness of breath.   Cardiovascular: Negative.  Negative for chest pain, palpitations, orthopnea, leg swelling and PND.  Gastrointestinal: Negative.  Negative for abdominal pain, blood in stool, constipation, diarrhea, melena, nausea and vomiting.  Genitourinary: Negative.  Negative for dysuria, frequency, hematuria and urgency.  Musculoskeletal: Negative.  Negative for back pain, falls, joint pain and myalgias.  Skin:  Negative.  Negative for itching and rash.       RIGHT axilla healed   Neurological: Positive for tingling (little bit in hands not affecting function). Negative for dizziness, tremors (essential), focal weakness, weakness and headaches (migraines; chronic).  Endo/Heme/Allergies: Negative.  Does not bruise/bleed easily.  Psychiatric/Behavioral: Negative for depression, memory loss and suicidal ideas. The patient is not nervous/anxious and does not have insomnia.   All other systems reviewed and are negative.  Vitals signs: BP 118/75 (BP Location: Left Arm, Patient Position: Sitting)   Pulse 83   Temp (!) 97.1 F (36.2 C) (Tympanic)   Resp 18   Wt 151 lb 9.6 oz (68.8 kg)   BMI 25.23 kg/m   Physical Exam  Constitutional: She is oriented  to person, place, and time and well-developed, well-nourished, and in no distress. No distress.  HENT:  Head: Normocephalic. Hair is abnormal (Alopecia; short strawberry blonde wig).  Mouth/Throat: Oropharynx is clear and moist. No oropharyngeal exudate.  Eyes: Pupils are equal, round, and reactive to light. Conjunctivae and EOM are normal. No scleral icterus.  Hazel/green eyes  Neck: Normal range of motion. Neck supple. No JVD present. No tracheal deviation present.  Cardiovascular: Normal rate, regular rhythm and normal heart sounds. Exam reveals no gallop and no friction rub.  No murmur heard. Pulmonary/Chest: Effort normal and breath sounds normal. No respiratory distress. She has no wheezes. She has no rales.  Abdominal: Soft. Bowel sounds are normal. She exhibits no mass. There is no tenderness.  Musculoskeletal: Normal range of motion. She exhibits no edema or tenderness.  Lymphadenopathy:       Head (right side): No submandibular, no preauricular, no posterior auricular and no occipital adenopathy present.       Head (left side): No submandibular, no preauricular, no posterior auricular and no occipital adenopathy present.    She has no cervical  adenopathy.       Right cervical: No deep cervical and no posterior cervical adenopathy present.      Left cervical: No deep cervical and no posterior cervical adenopathy present.    She has no axillary adenopathy.       Right: No inguinal and no supraclavicular adenopathy present.       Left: No inguinal and no supraclavicular adenopathy present.  Neurological: She is alert and oriented to person, place, and time. Gait normal.  Skin: Skin is warm, dry and intact. No rash noted. She is not diaphoretic. No pallor.     Psychiatric: Mood, memory, affect and judgment normal.   No results found for this or any previous visit (from the past 24 hour(s)).   Assessment:  Megan Vega is a 62 y.o. female with clinical stage [id number] right breast cancer and DCIS in the left breast s/p ultrasound guided biopsy on 10/29/2017. Pathology from the right breast at the 10 o'clock position 3 cm from the nipple revealed a 1.4 cm grade II invasive mammary carcinoma of no special type.  Tumor was ER + (> 90%), PR + (90%) and Her2/neu 1+.  Right axillary node biopsy revealed metastatic carcinoma.  Left breast biopsy at the 2 o'clock position 3 cm from the nipple revealed high grade DCIS with comedonecrosis.  CA27.29 was 23.5 on 11/14/2017.  Bilateral diagnostic mammogram and ultrasound on 10/23/2017 revealed a 3.9 x 1.7 x 2.2 cm irregular hypoechoic mass with associated areas of vascularity at the 10 o'clock position 3 cm from the nipple and a 1.1 x 0.5 x 0.4 cm area of nodularity (3 approximately 3 mm nodules at 10 o'clock 6 cm from the nipple in the right breast. The mass was within a 5 cm area of pleomorphic microcalcifications.  Ultrasound revealed at least 5 prominent lymph nodes with diffuse thickened and hypoechoic cortices suggestive of metastatic disease.  In the left breast, there was an irregular 1.6 x 1.3 x 1.2 cm hypoechoic mass with indistinct margins at 2 o'clock 3 cm from the nipple.  Axillary ultrasound  revealed normal nodes.  Bilateral breast MRI on 11/17/2017 revealed a 4.3 x 4.8 x 5 cm mass on the right centered in the upper outer quadrant but also extends into the lower outer and upper inner quadrants.  There was a small amount of enhancement posterior to the right nipple  which could represent subtle extension. There was a cluster of nodules located posterior to the superior aspect of the known malignancy and another small nodule located posterior to the inferior aspect of the malignancy which could represent satellite lesions. Taking the enhancement posterior to the right nipple and the potential posterior satellite lesions into account, the AP dimension could be as large as 7 cm.  The patient's known DCIS spans 5 x 5 x 4.4 cm mammographically based on calcifications. The associated enhancement spans 3.2 x 3.2 x 3.3 cm. However, there were 2 small linearly enhancing regions at 6 o'clock in the left breast which could represent further disease.  There was extensive adenopathy in the right axilla and right retropectoral region. There was at least 1 significantly enlarged right internal mammary lymph node.  PET scan on 11/24/2017 revealed hypermetabolic right axillary (2.6 x 3.1 cm; SUV 8.0), subpectoral/internal mammary (7 mm; SUV 2.4) adenopathy.  There was a 4 mm right lower lobe nodule is too small for PET resolution.  There was cholelithiasis.  She received 4 cycles of AC (12/05/2017 - 01/30/2018) with OnPro Neulasta support.  She is s/p 8 weeks of Taxol (02/13/2018 - 02/27/2018; 04/10/2018 - 05/08/2018).  Week #4 was delayed secondary to a right axillary abscess and neutropenia.   Breast ultrasound on 02/18/2018 revealed a 1.0 x 1.36 x 2.14 cm dominant mass in the upper outer quadrant of the right breast (>50% volume decrease).  Axillary lymph nodes showed marked improvement with only 2 of  5 previous identified  lymph nodes identifiable (largest 1.3 cm; smaller node with cortical thickness 0.39  compared to 0.99).  Bone density on 12/16/2016 revealed osteopenia with a T-score of -1.6 in the left femoral neck on 12/16/2017.  She developed a right axillary abscess and underwent incision and drainage on 03/12/2018.  Culture grew staph aureus.  She completed Bactrim on 03/26/2018.  Symptomatically, she is doing well.  She has a grade I chemotherapy induced neuropathy.  Exam is stable.  WBC 3200 ([place] 1900).  Potassium is 3.9.  Plan: 1. Labs today:  CBC with diff, CMP, Mg.  2. Labs reviewed.  Blood counts stable and adequate enough for treatment. Will proceed with week #9 Taxol today. 3. Discuss follow up with Dr. Bary Vega. Patient leaning towards BILATERAL radical mastectomy with immediate breast reconstruction. She is scheduled to see. Dr. Marla Roe (plastics) on 05/22/2018 after her infusion.  4. Refer to radiation oncology (Dr. Baruch Gouty) to discuss potential need for post surgical radiation as patient weighs surgical options.  5. RTC in 1 week for labs (CBC with diff, CMP, Mg) and week #10 Taxol. 6. RTC in 2 weeks for MD assessment, labs (CBC with diff, CMP, Mg) and week #11 Taxol.   Honor Loh, NP 05/15/2018, 9:20 AM   I saw and evaluated the patient, participating in the key portions of the service and reviewing pertinent diagnostic studies and records.  I reviewed the nurse practitioner's note and agree with the findings and the plan.  The assessment and plan were discussed with the patient. Several questions were asked by the patient and answered.   Nolon Stalls, MD 05/15/2018,9:20 AM

## 2018-05-16 DIAGNOSIS — T451X5A Adverse effect of antineoplastic and immunosuppressive drugs, initial encounter: Secondary | ICD-10-CM | POA: Insufficient documentation

## 2018-05-16 DIAGNOSIS — G620 Drug-induced polyneuropathy: Secondary | ICD-10-CM | POA: Insufficient documentation

## 2018-05-19 ENCOUNTER — Encounter: Payer: Self-pay | Admitting: Radiation Oncology

## 2018-05-19 ENCOUNTER — Other Ambulatory Visit: Payer: Self-pay

## 2018-05-19 ENCOUNTER — Ambulatory Visit
Admission: RE | Admit: 2018-05-19 | Discharge: 2018-05-19 | Disposition: A | Discharge status: Home / Self Care | Payer: 59 | Source: Ambulatory Visit | Attending: Radiation Oncology | Admitting: Radiation Oncology

## 2018-05-19 VITALS — BP 127/80 | HR 79 | Resp 18 | Wt 151.8 lb

## 2018-05-19 DIAGNOSIS — Z809 Family history of malignant neoplasm, unspecified: Secondary | ICD-10-CM | POA: Insufficient documentation

## 2018-05-19 DIAGNOSIS — Z9221 Personal history of antineoplastic chemotherapy: Secondary | ICD-10-CM | POA: Insufficient documentation

## 2018-05-19 DIAGNOSIS — C50411 Malignant neoplasm of upper-outer quadrant of right female breast: Secondary | ICD-10-CM

## 2018-05-19 DIAGNOSIS — Z87891 Personal history of nicotine dependence: Secondary | ICD-10-CM | POA: Diagnosis not present

## 2018-05-19 DIAGNOSIS — G629 Polyneuropathy, unspecified: Secondary | ICD-10-CM | POA: Insufficient documentation

## 2018-05-19 DIAGNOSIS — K649 Unspecified hemorrhoids: Secondary | ICD-10-CM | POA: Insufficient documentation

## 2018-05-19 DIAGNOSIS — Z8614 Personal history of Methicillin resistant Staphylococcus aureus infection: Secondary | ICD-10-CM | POA: Insufficient documentation

## 2018-05-19 DIAGNOSIS — Z170 Estrogen receptor positive status [ER+]: Secondary | ICD-10-CM | POA: Insufficient documentation

## 2018-05-19 NOTE — Consult Note (Signed)
NEW PATIENT EVALUATION  Name: Megan Vega  MRN: [id number]  Date:   05/19/2018     DOB: 1956/02/23   This 62 y.o. female patient presents to the clinic for initial evaluation of stage IIIB invasive mammary carcinoma of the right breast and ductal carcinoma in situ over wide expanse of the left breast in patient undergoing neoadjuvant chemotherapy.  REFERRING PHYSICIAN: Burnard Hawthorne, FNP  CHIEF COMPLAINT:  Chief Complaint  Patient presents with  . Breast Cancer    Pt is here for initial consultation of breast cancer.       DIAGNOSIS: The encounter diagnosis was Primary cancer of upper outer quadrant of right breast ([place]).   PREVIOUS INVESTIGATIONS:  Pathology reports reviewed Mammograms ultrasound and PET/CT scans reviewed Clinical notes reviewed  HPI: patient is a 62 year old female who presented with an abnormal mammogram back in November 2018 showing architectural distortion a possible right axillary lymph node involvement of the right breast. There is also microcalcifications in the left breast. Ultrasound confirmed a 3.9 cm irregular hypoechoic mass at the 10:00 position 3 cm from the nipple as well as 5 prominent lymph nodes suspicious for malignancy. In the left breast is a 1.6 cm mass with indistinct margins with axillary nodes appearing normal. Biopsy from the right breast showed grade 2 invasive mammary carcinoma ER/PR positive HER-2/neu not overexpressed. Right axillary lymph node biopsy also showed metastatic adenocarcinoma consistent with breast origin.Left breast showed ductal carcinoma in situ with comedonecrosis.patient underwent a PET CT scan showing hypermetabolic right axillary subpectoral internal mammary adenopathy.patient is undergone neoadjuvant chemotherapy withCytoxan and Adriamycin as well as Taxol. This is been complicated by an abscess in the right axilla. I most recent follow-up ultrasound there was a 56% reduction the right breast mass. She does have  some grade 1 neuropathy in her fingertips and toes.at this point she is contemplating surgery after her chemotherapy and is here for radiation oncology opinion. She is otherwise doing well specifically denies breast tenderness cough or bone pain.  PLANNED TREATMENT REGIMEN: probable bilateral mastectomies with adjuvant radiation therapy to white right chest wall and peripheral lymphatics  PAST MEDICAL HISTORY:  has a past medical history of Ankle fracture, Cancer ([place]), Cataract, Family history of adverse reaction to anesthesia, Hemorrhoids, History of methicillin resistant staphylococcus aureus (MRSA) (2015), and Migraines.    PAST SURGICAL HISTORY:  Past Surgical History:  Procedure Laterality Date  . BREAST BIOPSY Bilateral 10/29/2017   L breast DCIS, R breast invasive mammary carcinoma of no special type, ER/PR+, Her2/neu 1+  . CESAREAN SECTION    . COLONOSCOPY  2012  . IRRIGATION AND DEBRIDEMENT ABSCESS Right 03/12/2018   Procedure: IRRIGATION AND DEBRIDEMENT RIGHT AXILLARY ABSCESS;  Surgeon: Robert Bellow, MD;  Location: [facility] ORS;  Service: General;  Laterality: Right;  . MANDIBLE FRACTURE SURGERY    . PORTACATH PLACEMENT Left 11/28/2017   Procedure: INSERTION PORT-A-CATH;  Surgeon: Robert Bellow, MD;  Location: [facility] ORS;  Service: General;  Laterality: Left;    FAMILY HISTORY: family history includes Cancer in her father and mother; Diabetes in her brother and father; Early death in her mother; Heart attack in her paternal grandfather.  SOCIAL HISTORY:  reports that she quit smoking about 41 years ago. She has a 2.50 pack-year smoking history. She has never used smokeless tobacco. She reports that she drinks about 0.6 oz of alcohol per week. She reports that she does not use drugs.  ALLERGIES: Peanut-containing drug products  MEDICATIONS:  Current Outpatient Medications  Medication Sig Dispense Refill  . acetaminophen (TYLENOL) 500 MG tablet Take 1,000 mg by mouth every 6  (six) hours as needed for moderate pain or headache.    . b complex vitamins tablet Take 1 tablet by mouth daily.    Marland Kitchen lidocaine-prilocaine (EMLA) cream Apply to affected area once 30 g 3  . loratadine (CLARITIN) 10 MG tablet Take 10 mg by mouth daily.    Marland Kitchen LORazepam (ATIVAN) 0.5 MG tablet Take 1 tablet (0.5 mg total) by mouth every 6 (six) hours as needed (Nausea or vomiting). 30 tablet 0  . Multiple Vitamins-Minerals (HAIR/SKIN/NAILS/BIOTIN PO) Take 1 tablet by mouth daily.     . ondansetron (ZOFRAN) 8 MG tablet Take 1 tablet (8 mg total) by mouth 2 (two) times daily as needed. Start on the third day after chemotherapy. 30 tablet 1  . polyethylene glycol (MIRALAX / GLYCOLAX) packet Take 17 g by mouth daily as needed for mild constipation.    . potassium chloride SA (K-DUR,KLOR-CON) 20 MEQ tablet Take 1 tablet (20 mEq total) by mouth 2 (two) times daily. (Patient not taking: Reported on 05/15/2018) 14 tablet 0   No current facility-administered medications for this encounter.    Facility-Administered Medications Ordered in Other Encounters  Medication Dose Route Frequency Provider Last Rate Last Dose  . heparin lock flush 100 unit/mL  500 Units Intracatheter PRN Corcoran, Drue Second, MD        ECOG PERFORMANCE STATUS:  0 - Asymptomatic  REVIEW OF SYSTEMS:  Patient denies any weight loss, fatigue, weakness, fever, chills or night sweats. Patient denies any loss of vision, blurred vision. Patient denies any ringing  of the ears or hearing loss. No irregular heartbeat. Patient denies heart murmur or history of fainting. Patient denies any chest pain or pain radiating to her upper extremities. Patient denies any shortness of breath, difficulty breathing at night, cough or hemoptysis. Patient denies any swelling in the lower legs. Patient denies any nausea vomiting, vomiting of blood, or coffee ground material in the vomitus. Patient denies any stomach pain. Patient states has had normal bowel  movements no significant constipation or diarrhea. Patient denies any dysuria, hematuria or significant nocturia. Patient denies any problems walking, swelling in the joints or loss of balance. Patient denies any skin changes, loss of hair or loss of weight. Patient denies any excessive worrying or anxiety or significant depression. Patient denies any problems with insomnia. Patient denies excessive thirst, polyuria, polydipsia. Patient denies any swollen glands, patient denies easy bruising or easy bleeding. Patient denies any recent infections, allergies or URI. Patient "s visual fields have not changed significantly in recent time.    PHYSICAL EXAM: BP 127/80   Pulse 79   Resp 18   Wt 151 lb 12.6 oz (68.9 kg)   BMI 25.26 kg/m  Did not detect any dominant mass in either breast. No axillary or supra clavicular adenopathy is identified.Well-developed well-nourished patient in NAD. HEENT reveals PERLA, EOMI, discs not visualized.  Oral cavity is clear. No oral mucosal lesions are identified. Neck is clear without evidence of cervical or supraclavicular adenopathy. Lungs are clear to A&P. Cardiac examination is essentially unremarkable with regular rate and rhythm without murmur rub or thrill. Abdomen is benign with no organomegaly or masses noted. Motor sensory and DTR levels are equal and symmetric in the upper and lower extremities. Cranial nerves II through XII are grossly intact. Proprioception is intact. No peripheral adenopathy or edema is identified. No motor or sensory levels are  noted. Crude visual fields are within normal range.  LABORATORY DATA: pathology reports reviewed    RADIOLOGY RESULTS:mammogram ultrasound and PET/CT scan are reviewed  IMPRESSION: stage IIIB invasive mammary carcinoma the right breast undergoing neoadjuvant chemotherapy as well as significant ductal carcinoma in situ with comedonecrosis of the left breast in 62 year old female  PLAN: at this time I had a long  discussion with the patient and her daughter. I've also discussed the case with Dr. Tollie Pizza. He is concerned about significant distortion of the left breast if we attempt wide local excision and would suggest mastectomy to the left breast as well as the right breast. She also will need axillary lymph node dissection ton the right. Based on poor prognostic features including large tumor size bulkyaxillary malignant adenopathy as well as internal mammary positivity on PET CT scan would recommend adjuvant radiation therapy to her right chest wall peripheral lymphatics. I will plan on delivering 5040 cGy in 28 fractions to both those areasscar boost would be dependent on final surgical pathology. I would prefer radiation treatments beginning after her surgery rather than immediate reconstruction which will delay further her treatment timing. It is been greater than 6 months since the initiation of her chemotherapy and I am anxious to get started on her adjuvant radiation therapy as soon as possible. I will incorporate her internal mammary nodes as well as axilla and based on her PET/CT findings. Risks and benefits of radiation therapy including skin reaction fatigue alteration of blood counts possible lymphedema of her right upper extremity all were discussed in detail with the patient and her daughter. They both seem to comprehend my recommendations well. Follow-up with Dr. Tollie Pizza will occur in the next several weeks. Case was personally discussed with him.  I would like to take this opportunity to thank you for allowing me to participate in the care of your patient.Noreene Filbert, MD

## 2018-05-22 ENCOUNTER — Other Ambulatory Visit: Payer: Self-pay | Admitting: Hematology and Oncology

## 2018-05-22 ENCOUNTER — Inpatient Hospital Stay: Payer: 59

## 2018-05-22 ENCOUNTER — Inpatient Hospital Stay: Payer: 59 | Attending: Hematology and Oncology

## 2018-05-22 VITALS — BP 112/74 | HR 69 | Temp 97.7°F | Resp 20 | Wt 152.2 lb

## 2018-05-22 DIAGNOSIS — Z800 Family history of malignant neoplasm of digestive organs: Secondary | ICD-10-CM | POA: Insufficient documentation

## 2018-05-22 DIAGNOSIS — C50411 Malignant neoplasm of upper-outer quadrant of right female breast: Secondary | ICD-10-CM | POA: Diagnosis not present

## 2018-05-22 DIAGNOSIS — Z87891 Personal history of nicotine dependence: Secondary | ICD-10-CM | POA: Diagnosis not present

## 2018-05-22 DIAGNOSIS — Z801 Family history of malignant neoplasm of trachea, bronchus and lung: Secondary | ICD-10-CM | POA: Diagnosis not present

## 2018-05-22 DIAGNOSIS — Z5111 Encounter for antineoplastic chemotherapy: Secondary | ICD-10-CM | POA: Diagnosis not present

## 2018-05-22 DIAGNOSIS — Z85828 Personal history of other malignant neoplasm of skin: Secondary | ICD-10-CM | POA: Diagnosis not present

## 2018-05-22 DIAGNOSIS — Z170 Estrogen receptor positive status [ER+]: Secondary | ICD-10-CM | POA: Diagnosis not present

## 2018-05-22 DIAGNOSIS — Z8614 Personal history of Methicillin resistant Staphylococcus aureus infection: Secondary | ICD-10-CM | POA: Diagnosis not present

## 2018-05-22 LAB — CBC WITH DIFFERENTIAL/PLATELET
Basophils Absolute: 0.1 10*3/uL (ref 0–0.1)
Basophils Relative: 2 %
Eosinophils Absolute: 0 10*3/uL (ref 0–0.7)
Eosinophils Relative: 1 %
HCT: 34.4 % — ABNORMAL LOW (ref 35.0–47.0)
Hemoglobin: 12.1 g/dL (ref 12.0–16.0)
Lymphocytes Relative: 24 %
Lymphs Abs: 0.8 10*3/uL — ABNORMAL LOW (ref 1.0–3.6)
MCH: 29.9 pg (ref 26.0–34.0)
MCHC: 35.2 g/dL (ref 32.0–36.0)
MCV: 85.1 fL (ref 80.0–100.0)
Monocytes Absolute: 0.4 10*3/uL (ref 0.2–0.9)
Monocytes Relative: 10 %
Neutro Abs: 2.2 10*3/uL (ref 1.4–6.5)
Neutrophils Relative %: 63 %
Platelets: 288 10*3/uL (ref 150–440)
RBC: 4.04 MIL/uL (ref 3.80–5.20)
RDW: 15.3 % — ABNORMAL HIGH (ref 11.5–14.5)
WBC: 3.5 10*3/uL — ABNORMAL LOW (ref 3.6–11.0)

## 2018-05-22 LAB — COMPREHENSIVE METABOLIC PANEL
ALT: 20 U/L (ref 0–44)
AST: 24 U/L (ref 15–41)
Albumin: 3.9 g/dL (ref 3.5–5.0)
Alkaline Phosphatase: 65 U/L (ref 38–126)
Anion gap: 8 (ref 5–15)
BUN: 13 mg/dL (ref 8–23)
CO2: 22 mmol/L (ref 22–32)
Calcium: 8.8 mg/dL — ABNORMAL LOW (ref 8.9–10.3)
Chloride: 104 mmol/L (ref 98–111)
Creatinine, Ser: 0.8 mg/dL (ref 0.44–1.00)
GFR calc Af Amer: >60 mL/min (ref >60)
GFR calc non Af Amer: >60 mL/min (ref >60)
Glucose, Bld: 104 mg/dL — ABNORMAL HIGH (ref 70–99)
Potassium: 4 mmol/L (ref 3.5–5.1)
Sodium: 134 mmol/L — ABNORMAL LOW (ref 135–145)
Total Bilirubin: 0.5 mg/dL (ref 0.3–1.2)
Total Protein: 6.5 g/dL (ref 6.5–8.1)

## 2018-05-22 LAB — MAGNESIUM: Magnesium: 2.1 mg/dL (ref 1.7–2.4)

## 2018-05-22 MED ORDER — FAMOTIDINE IN NACL 20-0.9 MG/50ML-% IV SOLN
20.0000 mg | Freq: Once | INTRAVENOUS | Status: AC
  Administered 2018-05-22: 20 mg via INTRAVENOUS
  Filled 2018-05-22: qty 50

## 2018-05-22 MED ORDER — SODIUM CHLORIDE 0.9% FLUSH
10.0000 mL | Freq: Once | INTRAVENOUS | Status: AC
  Administered 2018-05-22: 10 mL via INTRAVENOUS
  Filled 2018-05-22: qty 10

## 2018-05-22 MED ORDER — HEPARIN SOD (PORK) LOCK FLUSH 100 UNIT/ML IV SOLN
500.0000 [IU] | Freq: Once | INTRAVENOUS | Status: AC
  Administered 2018-05-22: 500 [IU] via INTRAVENOUS
  Filled 2018-05-22: qty 5

## 2018-05-22 MED ORDER — SODIUM CHLORIDE 0.9 % IV SOLN
80.0000 mg/m2 | Freq: Once | INTRAVENOUS | Status: AC
  Administered 2018-05-22: 144 mg via INTRAVENOUS
  Filled 2018-05-22: qty 24

## 2018-05-22 MED ORDER — DIPHENHYDRAMINE HCL 50 MG/ML IJ SOLN
50.0000 mg | Freq: Once | INTRAMUSCULAR | Status: AC
Start: 2018-05-22 — End: 2018-05-22
  Administered 2018-05-22: 50 mg via INTRAVENOUS
  Filled 2018-05-22: qty 1

## 2018-05-22 MED ORDER — DEXAMETHASONE SODIUM PHOSPHATE 100 MG/10ML IJ SOLN
20.0000 mg | Freq: Once | INTRAMUSCULAR | Status: AC
  Administered 2018-05-22: 20 mg via INTRAVENOUS
  Filled 2018-05-22: qty 2

## 2018-05-22 MED ORDER — SODIUM CHLORIDE 0.9 % IV SOLN
Freq: Once | INTRAVENOUS | Status: AC
  Administered 2018-05-22: 11:00:00 via INTRAVENOUS
  Filled 2018-05-22: qty 1000

## 2018-05-26 ENCOUNTER — Other Ambulatory Visit: Payer: Self-pay | Admitting: Hematology and Oncology

## 2018-05-27 ENCOUNTER — Encounter: Payer: Self-pay | Admitting: Urgent Care

## 2018-05-27 NOTE — Progress Notes (Signed)
[facility] day:  05/29/2018   Chief Complaint: Megan Vega is a 62 y.o. female with stage IIIB right breast cancer and DCIS of the left breast who is seen for assessment prior to week #11 Taxol.  HPI: The patient was last seen in the medical oncology clinic on 05/15/2018.  At that time,patient tolerating treatments well. Neuropathy in fingers and toes persisted (stable). She denied acute symptoms. Exam stable.  WBC 3200 (Lead 1900).  Potassium 3.9. She received week #9 Taxol.   She was seen in consult on 05/19/2018 by Dr. Noreene Filbert (radiation oncology). Notes reviewed. Plans are to pursue adjuvant radiation therapy to the patient's RIGHT chest wall peripheral lymphatics. Patient to receive 5040 cGy delivered over 28 fractions. Dr. Baruch Gouty discussed initiating treatment following surgery rather pursuing immediate breast reconstruction.   Patient returned to the clinic on 05/22/2018 for her next treatment. Pretreatment labs revealed a WBC of 3500 ([place] 2200). Platelets 288,000.  Sodium low at 134. Renal and hepatic function normal. Magnesium stable at 2.1. She received cycle #10 Taxol.   Patient is scheduled to meet with Dr. Audelia Hives (resconstructive surgery) later today to discuss options regarding adjuvant radiation versus immediate breast reconstruction.   In the interim, patient has been doing well overall. She is tolerating her treatments well. Patient was seen by PCP yesterday for complaints of "boils" on her head and face that declared "about a week ago". Patient states, "the one on my face was like a giant pimple. It had a white head". Due to patient's past history of staph, her PCP started on a 10 day course of doxycycline. Patient denies fevers.   Patient denies that she has experienced any B symptoms. Patient advises that she maintains an adequate appetite. She is eating well. Weight today is 152 lb 9 oz (69.2 kg), which compared  to her last visit to the clinic, represents a 1 pound increase.   Patient denies pain in the clinic today.  Past Medical History:  Diagnosis Date  . Ankle fracture   . Cancer [facility])    Basal Cell Carcinoma, about 2011 chest  . Cataract   . Family history of adverse reaction to anesthesia    DAUGHTER-N/V  . Hemorrhoids   . History of methicillin resistant staphylococcus aureus (MRSA) 2015  . Migraines    MIGRAINES    Past Surgical History:  Procedure Laterality Date  . BREAST BIOPSY Bilateral 10/29/2017   L breast DCIS, R breast invasive mammary carcinoma of no special type, ER/PR+, Her2/neu 1+  . CESAREAN SECTION    . COLONOSCOPY  2012  . IRRIGATION AND DEBRIDEMENT ABSCESS Right 03/12/2018   Procedure: IRRIGATION AND DEBRIDEMENT RIGHT AXILLARY ABSCESS;  Surgeon: Robert Bellow, MD;  Location: [facility] ORS;  Service: General;  Laterality: Right;  . MANDIBLE FRACTURE SURGERY    . PORTACATH PLACEMENT Left 11/28/2017   Procedure: INSERTION PORT-A-CATH;  Surgeon: Robert Bellow, MD;  Location: [facility] ORS;  Service: General;  Laterality: Left;    Family History  Problem Relation Age of Onset  . Cancer Mother        Esophageal Cancer  . Early death Mother        Age 78  . Cancer Father        Lung Cancer  . Diabetes Father   . Diabetes Brother        Controlled with diet  . Heart attack Paternal Grandfather     Social History:  reports that she quit smoking about 41 years ago. She has a 2.50 pack-year smoking history. She has never used smokeless tobacco. She reports that she drinks about 0.6 oz of alcohol per week. She reports that she does not use drugs.  Patient is a former smoker for 20 years; only smoked "about 3 cigarettes a day"; stopped 30 years ago. Patient drinks alcohol very infrequently; "about 1 glass a month". Patient currently employed as an Web designer at [employer]. She denies any known exposures to radiations or toxins.  She lives in  [place].  Her grand daughter is graduating in 03/2018.  She has one daughter [name]).  She is accompanied by her daughter  today.  Allergies:  Allergies  Allergen Reactions  . Peanut-Containing Drug Products Swelling and Other (See Comments)    Only when eating too many peanuts, swelling with lips and tingly face    Current Medications: Current Outpatient Medications  Medication Sig Dispense Refill  . acetaminophen (TYLENOL) 500 MG tablet Take 1,000 mg by mouth every 6 (six) hours as needed for moderate pain or headache.    . b complex vitamins tablet Take 1 tablet by mouth daily.    Marland Kitchen doxycycline (VIBRA-TABS) 100 MG tablet Take 1 tablet (100 mg total) by mouth 2 (two) times daily. 20 tablet 0  . lidocaine-prilocaine (EMLA) cream Apply to affected area once 30 g 3  . loratadine (CLARITIN) 10 MG tablet Take 10 mg by mouth daily.    Marland Kitchen LORazepam (ATIVAN) 0.5 MG tablet Take 1 tablet (0.5 mg total) by mouth every 6 (six) hours as needed (Nausea or vomiting). 30 tablet 0  . Multiple Vitamins-Minerals (HAIR/SKIN/NAILS/BIOTIN PO) Take 1 tablet by mouth daily.     . ondansetron (ZOFRAN) 8 MG tablet Take 1 tablet (8 mg total) by mouth 2 (two) times daily as needed. Start on the third day after chemotherapy. 30 tablet 1  . polyethylene glycol (MIRALAX / GLYCOLAX) packet Take 17 g by mouth daily as needed for mild constipation.     No current facility-administered medications for this visit.    Facility-Administered Medications Ordered in Other Visits  Medication Dose Route Frequency Provider Last Rate Last Dose  . dexamethasone (DECADRON) 20 mg in sodium chloride 0.9 % 50 mL IVPB  20 mg Intravenous Once Corcoran, Melissa C, MD      . heparin lock flush 100 unit/mL  500 Units Intracatheter PRN Corcoran, Melissa C, MD      . heparin lock flush 100 unit/mL  500 Units Intracatheter Once PRN Lequita Asal, MD      . PACLitaxel (TAXOL) 144 mg in sodium chloride 0.9 % 250 mL chemo infusion  (</= 82m/m2)  80 mg/m2 (Treatment Plan Recorded) Intravenous Once CLequita Asal MD        Review of Systems  Constitutional: Negative for diaphoresis, fever, malaise/fatigue and weight loss (weight up 1 pound).       "I am feeling good. I am getting excited to be almost finished".   HENT: Negative.   Eyes: Negative.   Respiratory: Negative for cough, hemoptysis, sputum production and shortness of breath.   Cardiovascular: Negative for chest pain, palpitations, orthopnea, leg swelling and PND.  Gastrointestinal: Negative for abdominal pain, blood in stool, constipation, diarrhea, melena, nausea and vomiting.  Genitourinary: Negative for dysuria, frequency, hematuria and urgency.  Musculoskeletal: Negative for back pain, falls, joint pain and myalgias.  Skin: Negative for itching and rash.       Areas  of compromised skin integrity to scalp and face.   Neurological: Positive for tingling (stable grade I neuropathy in hands and feet - not affecting function), tremors (essential) and headaches (PMH (+) for migranes). Negative for dizziness and weakness.  Endo/Heme/Allergies: Does not bruise/bleed easily.  Psychiatric/Behavioral: Negative for depression, memory loss and suicidal ideas. The patient is nervous/anxious. The patient does not have insomnia.   All other systems reviewed and are negative.  Performance status (ECOG): 1 - Symptomatic but completely ambulatory  Vitals signs: BP 114/78 (BP Location: Left Arm, Patient Position: Sitting)   Pulse 89   Temp (!) 97.3 F (36.3 C) (Tympanic)   Resp 18   Wt 152 lb 9 oz (69.2 kg)   BMI 25.39 kg/m    Physical Exam  Constitutional: She is oriented to person, place, and time and well-developed, well-nourished, and in no distress.  HENT:  Head: Normocephalic and atraumatic. Hair is abnormal (chemotherapy induced alopecia - wearing short strawberry blonde wig).  Eyes: Pupils are equal, round, and reactive to light. EOM are normal. No  scleral icterus.  Neck: Normal range of motion. Neck supple. No tracheal deviation present. No thyromegaly present.  Cardiovascular: Normal rate, regular rhythm and normal heart sounds. Exam reveals no gallop and no friction rub.  No murmur heard. Pulmonary/Chest: Effort normal and breath sounds normal. No respiratory distress. She has no wheezes. She has no rales.  Abdominal: Soft. Bowel sounds are normal. She exhibits no distension. There is no tenderness.  Musculoskeletal: Normal range of motion. She exhibits no edema or tenderness.  Lymphadenopathy:    She has no cervical adenopathy.    She has no axillary adenopathy.       Right: No inguinal and no supraclavicular adenopathy present.       Left: No inguinal and no supraclavicular adenopathy present.  Neurological: She is alert and oriented to person, place, and time.  Skin: Skin is warm and dry. No rash noted. No erythema.     Psychiatric: Mood, affect and judgment normal.  Nursing note and vitals reviewed.  Results for orders placed or performed in visit on 05/22/18  Magnesium  Result Value Ref Range   Magnesium 2.1 1.7 - 2.4 mg/dL  Comprehensive metabolic panel  Result Value Ref Range   Sodium 134 (L) 135 - 145 mmol/L   Potassium 4.0 3.5 - 5.1 mmol/L   Chloride 104 98 - 111 mmol/L   CO2 22 22 - 32 mmol/L   Glucose, Bld 104 (H) 70 - 99 mg/dL   BUN 13 8 - 23 mg/dL   Creatinine, Ser 0.80 0.44 - 1.00 mg/dL   Calcium 8.8 (L) 8.9 - 10.3 mg/dL   Total Protein 6.5 6.5 - 8.1 g/dL   Albumin 3.9 3.5 - 5.0 g/dL   AST 24 15 - 41 U/L   ALT 20 0 - 44 U/L   Alkaline Phosphatase 65 38 - 126 U/L   Total Bilirubin 0.5 0.3 - 1.2 mg/dL   GFR calc non Af Amer >60 >60 mL/min   GFR calc Af Amer >60 >60 mL/min   Anion gap 8 5 - 15  CBC with Differential  Result Value Ref Range   WBC 3.5 (L) 3.6 - 11.0 K/uL   RBC 4.04 3.80 - 5.20 MIL/uL   Hemoglobin 12.1 12.0 - 16.0 g/dL   HCT 34.4 (L) 35.0 - 47.0 %   MCV 85.1 80.0 - 100.0 fL   MCH 29.9  26.0 - 34.0 pg   MCHC 35.2 32.0 -  36.0 g/dL   RDW 15.3 (H) 11.5 - 14.5 %   Platelets 288 150 - 440 K/uL   Neutrophils Relative % 63 %   Neutro Abs 2.2 1.4 - 6.5 K/uL   Lymphocytes Relative 24 %   Lymphs Abs 0.8 (L) 1.0 - 3.6 K/uL   Monocytes Relative 10 %   Monocytes Absolute 0.4 0.2 - 0.9 K/uL   Eosinophils Relative 1 %   Eosinophils Absolute 0.0 0 - 0.7 K/uL   Basophils Relative 2 %   Basophils Absolute 0.1 0 - 0.1 K/uL     Assessment:  Camara Renstrom is a 62 y.o. female with clinical stage [id number] right breast cancer and DCIS in the left breast s/p ultrasound guided biopsy on 10/29/2017. Pathology from the right breast at the 10 o'clock position 3 cm from the nipple revealed a 1.4 cm grade II invasive mammary carcinoma of no special type.  Tumor was ER + (> 90%), PR + (90%) and Her2/neu 1+.  Right axillary node biopsy revealed metastatic carcinoma.  Left breast biopsy at the 2 o'clock position 3 cm from the nipple revealed high grade DCIS with comedonecrosis.  CA27.29 was 23.5 on 11/14/2017.  Bilateral diagnostic mammogram and ultrasound on 10/23/2017 revealed a 3.9 x 1.7 x 2.2 cm irregular hypoechoic mass with associated areas of vascularity at the 10 o'clock position 3 cm from the nipple and a 1.1 x 0.5 x 0.4 cm area of nodularity (3 approximately 3 mm nodules at 10 o'clock 6 cm from the nipple in the right breast. The mass was within a 5 cm area of pleomorphic microcalcifications.  Ultrasound revealed at least 5 prominent lymph nodes with diffuse thickened and hypoechoic cortices suggestive of metastatic disease.  In the left breast, there was an irregular 1.6 x 1.3 x 1.2 cm hypoechoic mass with indistinct margins at 2 o'clock 3 cm from the nipple.  Axillary ultrasound revealed normal nodes.  Bilateral breast MRI on 11/17/2017 revealed a 4.3 x 4.8 x 5 cm mass on the right centered in the upper outer quadrant but also extends into the lower outer and upper inner quadrants.  There was a  small amount of enhancement posterior to the right nipple which could represent subtle extension. There was a cluster of nodules located posterior to the superior aspect of the known malignancy and another small nodule located posterior to the inferior aspect of the malignancy which could represent satellite lesions. Taking the enhancement posterior to the right nipple and the potential posterior satellite lesions into account, the AP dimension could be as large as 7 cm.  The patient's known DCIS spans 5 x 5 x 4.4 cm mammographically based on calcifications. The associated enhancement spans 3.2 x 3.2 x 3.3 cm. However, there were 2 small linearly enhancing regions at 6 o'clock in the left breast which could represent further disease.  There was extensive adenopathy in the right axilla and right retropectoral region. There was at least 1 significantly enlarged right internal mammary lymph node.  PET scan on 11/24/2017 revealed hypermetabolic right axillary (2.6 x 3.1 cm; SUV 8.0), subpectoral/internal mammary (7 mm; SUV 2.4) adenopathy.  There was a 4 mm right lower lobe nodule is too small for PET resolution.  There was cholelithiasis.  She received 4 cycles of AC (12/05/2017 - 01/30/2018) with OnPro Neulasta support.  She is s/p 10 weeks of Taxol (02/13/2018 - 02/27/2018; 04/10/2018 - 05/22/2018).  Week #4 was delayed secondary to a right axillary abscess and neutropenia.   Breast  ultrasound on 02/18/2018 revealed a 1.0 x 1.36 x 2.14 cm dominant mass in the upper outer quadrant of the right breast (>50% volume decrease).  Axillary lymph nodes showed marked improvement with only 2 of  5 previous identified  lymph nodes identifiable (largest 1.3 cm; smaller node with cortical thickness 0.39 compared to 0.99).  Bone density on 12/16/2016 revealed osteopenia with a T-score of -1.6 in the left femoral neck on 12/16/2017.  She developed a right axillary abscess and underwent incision and drainage on 03/12/2018.   Culture grew staph aureus.  She completed Bactrim on 03/26/2018.  Symptomatically, patient is doing well. She had a few pustular lesions declare to her RIGHT scale and face. Cultures pending. On 10 day course of doxycycline. Tolerating treatments well. Voice no complaints. Exam stable with the exception of the scabbed areas to her RIGHT scalp and face. She has a grade I chemotherapy induced neuropathy that is stable.  WBC 3800 ([place] 2600).   Plan: 1. Labs today:  CBC with diff, CMP, Mg.  2. Discuss consult with radiation oncology  Plans are to deliver 5040 cGy over 28 fractions.  Recommending that adjuvant radiation treatments be starting immediately following surgery rather than pursuing breast reconstruction.   Follow up with radiation oncology as already scheduled.  3. Patient with scheduled visit with reconstructive surgery today. She is unsure if she is going to have immediate reconstruction or pursue adjuvant XRT first. She has questions about post-mastectomy supplies (bras). She was placed in contact with navigator to discuss.  4. Labs reviewed. Blood counts stable and adequate enough for treatment. Discuss current ABX course. Patient to call clinic if she were to develop fever, chills, or any other concerning symptoms. Will proceed with week #11 Taxol.  5. Discuss symptom management.  Patient has antiemetics and pain medications at home to use on a PRN basis. Patient  advising that the  prescribed interventions are adequate at this point. Continue all medications as previously prescribed.  6. RTC in 1 week for labs (CBC with diff, CMP, Mg) and week #12 Taxol.  7. RTC in 2 weeks for MD assessment, labs (CBC with diff, CMP, Mg), and further discussions regarding plans going forward.   Honor Loh, NP 05/29/2018, 9:45 AM

## 2018-05-28 ENCOUNTER — Ambulatory Visit (INDEPENDENT_AMBULATORY_CARE_PROVIDER_SITE_OTHER): Payer: 59 | Admitting: Family Medicine

## 2018-05-28 ENCOUNTER — Encounter: Payer: Self-pay | Admitting: Family Medicine

## 2018-05-28 VITALS — BP 138/80 | HR 91 | Temp 99.3°F | Resp 16 | Wt 153.4 lb

## 2018-05-28 DIAGNOSIS — L0292 Furuncle, unspecified: Secondary | ICD-10-CM | POA: Diagnosis not present

## 2018-05-28 MED ORDER — DOXYCYCLINE HYCLATE 100 MG PO TABS: 100.0000 mg | ORAL_TABLET | Freq: Two times a day (BID) | ORAL | 0 refills | Status: DC

## 2018-05-28 NOTE — Patient Instructions (Signed)
Please take medication as directed and avoid direct sunlight when taking this medication. Warm compresses can be used for no more than 15 minutes every hour.  Please also let your oncology provider know tomorrow that you have started this medication.  If symptoms do not improve with treatment, worsen, or new symptoms develop, follow up for further evaluation and treatment.    Skin Abscess A skin abscess is an infected area on or under your skin that contains pus and other material. An abscess can happen almost anywhere on your body. Some abscesses break open (rupture) on their own. Most continue to get worse unless they are treated. The infection can spread deeper into the body and into your blood, which can make you feel sick. Treatment usually involves draining the abscess. Follow these instructions at home: Abscess Care  If you have an abscess that has not drained, place a warm, clean, wet washcloth over the abscess several times a day. Do this as told by your doctor.  Follow instructions from your doctor about how to take care of your abscess. Make sure you: ? Cover the abscess with a bandage (dressing). ? Change your bandage or gauze as told by your doctor. ? Wash your hands with soap and water before you change the bandage or gauze. If you cannot use soap and water, use hand sanitizer.  Check your abscess every day for signs that the infection is getting worse. Check for: ? More redness, swelling, or pain. ? More fluid or blood. ? Warmth. ? More pus or a bad smell. Medicines   Take over-the-counter and prescription medicines only as told by your doctor.  If you were prescribed an antibiotic medicine, take it as told by your doctor. Do not stop taking the antibiotic even if you start to feel better. General instructions  To avoid spreading the infection: ? Do not share personal care items, towels, or hot tubs with others. ? Avoid making skin-to-skin contact with other  people.  Keep all follow-up visits as told by your doctor. This is important. Contact a doctor if:  You have more redness, swelling, or pain around your abscess.  You have more fluid or blood coming from your abscess.  Your abscess feels warm when you touch it.  You have more pus or a bad smell coming from your abscess.  You have a fever.  Your muscles ache.  You have chills.  You feel sick. Get help right away if:  You have very bad (severe) pain.  You see red streaks on your skin spreading away from the abscess. This information is not intended to replace advice given to you by your health care provider. Make sure you discuss any questions you have with your health care provider. Document Released: 04/22/2008 Document Revised: 06/30/2016 Document Reviewed: 09/13/2015 Elsevier Interactive Patient Education  [employer].

## 2018-05-28 NOTE — Progress Notes (Signed)
Subjective:    Patient ID: Megan Vega, female    DOB: Jan 07, 1956, 62 y.o.   MRN: [id number]  HPI  Megan Vega is a 62 year old female who presents with a lesion on the right side of her face described as a "boil" that presented one week ago. She reports other lesions that appear to be similar to a "boil" are on her head. She noticed these areas as her hair has been growing back after recent chemo treatment.  Associated symptoms of mild erythema and minimal yellow drainage described as a "top" of the lesion "popped off" on her face. History of MRSA per patient.  She is completing chemotherapy once weekly and has 2 more treatments left for breast cancer.   Erythema: Yes, mild Warmth: No Edema: Mild Fever: No Chills: No Malaise: No Headache: No Treatments: No treatments tried at this time other than patient covering areas with liquid bandaid  Review of Systems  Constitutional: Negative for chills, fatigue and fever.  Respiratory: Negative for cough, shortness of breath and wheezing.   Cardiovascular: Negative for chest pain and palpitations.  Gastrointestinal: Negative for abdominal pain.  Musculoskeletal: Negative for myalgias.  Skin: Negative for rash.       Bump on right side of face similar to a "boil". Bumps on head that look like "boils" are getting better.   Past Medical History:  Diagnosis Date  . Ankle fracture   . Cancer [facility])    Basal Cell Carcinoma, about 2011 chest  . Cataract   . Family history of adverse reaction to anesthesia    DAUGHTER-N/V  . Hemorrhoids   . History of methicillin resistant staphylococcus aureus (MRSA) 2015  . Migraines    MIGRAINES     Social History   Socioeconomic History  . Marital status: Divorced    Spouse name: Not on file  . Number of children: Not on file  . Years of education: Not on file  . Highest education level: Not on file  Occupational History  . Not on file  Social Needs  . Financial resource strain: Not on  file  . Food insecurity:    Worry: Not on file    Inability: Not on file  . Transportation needs:    Medical: Not on file    Non-medical: Not on file  Tobacco Use  . Smoking status: Former Smoker    Packs/day: 0.25    Years: 10.00    Pack years: 2.50    Last attempt to quit: 1978    Years since quitting: 41.5  . Smokeless tobacco: Never Used  Substance and Sexual Activity  . Alcohol use: Yes    Alcohol/week: 0.6 oz    Types: 1 Glasses of wine per week    Comment: RARE  . Drug use: No  . Sexual activity: Not Currently  Lifestyle  . Physical activity:    Days per week: Not on file    Minutes per session: Not on file  . Stress: Not on file  Relationships  . Social connections:    Talks on phone: Not on file    Gets together: Not on file    Attends religious service: Not on file    Active member of club or organization: Not on file    Attends meetings of clubs or organizations: Not on file    Relationship status: Not on file  . Intimate partner violence:    Fear of current or ex partner: Not on file  Emotionally abused: Not on file    Physically abused: Not on file    Forced sexual activity: Not on file  Other Topics Concern  . 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    Lives at home (son 74 yo lives with her)   Children 2   Pets: 3 small dogs and 1 frog    Caffeine- 1 cup of coffee in the morning, rare tea    Past Surgical History:  Procedure Laterality Date  . BREAST BIOPSY Bilateral 10/29/2017   L breast DCIS, R breast invasive mammary carcinoma of no special type, ER/PR+, Her2/neu 1+  . CESAREAN SECTION    . COLONOSCOPY  2012  . IRRIGATION AND DEBRIDEMENT ABSCESS Right 03/12/2018   Procedure: IRRIGATION AND DEBRIDEMENT RIGHT AXILLARY ABSCESS;  Surgeon: Robert Bellow, MD;  Location: [facility] ORS;  Service: General;  Laterality: Right;  . MANDIBLE FRACTURE SURGERY    . PORTACATH PLACEMENT Left 11/28/2017   Procedure:  INSERTION PORT-A-CATH;  Surgeon: Robert Bellow, MD;  Location: [facility] ORS;  Service: General;  Laterality: Left;    Family History  Problem Relation Age of Onset  . Cancer Mother        Esophageal Cancer  . Early death Mother        Age 27  . Cancer Father        Lung Cancer  . Diabetes Father   . Diabetes Brother        Controlled with diet  . Heart attack Paternal Grandfather     Allergies  Allergen Reactions  . Peanut-Containing Drug Products Swelling and Other (See Comments)    Only when eating too many peanuts, swelling with lips and tingly face    Current Outpatient Medications on File Prior to Visit  Medication Sig Dispense Refill  . acetaminophen (TYLENOL) 500 MG tablet Take 1,000 mg by mouth every 6 (six) hours as needed for moderate pain or headache.    . b complex vitamins tablet Take 1 tablet by mouth daily.    Marland Kitchen lidocaine-prilocaine (EMLA) cream Apply to affected area once 30 g 3  . loratadine (CLARITIN) 10 MG tablet Take 10 mg by mouth daily.    Marland Kitchen LORazepam (ATIVAN) 0.5 MG tablet Take 1 tablet (0.5 mg total) by mouth every 6 (six) hours as needed (Nausea or vomiting). 30 tablet 0  . Multiple Vitamins-Minerals (HAIR/SKIN/NAILS/BIOTIN PO) Take 1 tablet by mouth daily.     . ondansetron (ZOFRAN) 8 MG tablet Take 1 tablet (8 mg total) by mouth 2 (two) times daily as needed. Start on the third day after chemotherapy. 30 tablet 1  . polyethylene glycol (MIRALAX / GLYCOLAX) packet Take 17 g by mouth daily as needed for mild constipation.     Current Facility-Administered Medications on File Prior to Visit  Medication Dose Route Frequency Provider Last Rate Last Dose  . heparin lock flush 100 unit/mL  500 Units Intracatheter PRN Corcoran, Melissa C, MD        BP 138/80 (BP Location: Left Arm, Patient Position: Sitting, Cuff Size: Normal)   Pulse 91   Temp 99.3 F (37.4 C) (Oral)   Resp 16   Wt 153 lb 6 oz (69.6 kg)   SpO2 97%   BMI 25.52 kg/m  Retake of temp:  98.3 F      Objective:   Physical Exam  Constitutional: She is oriented to person, place, and time. She appears well-developed and  well-nourished.  Eyes: Pupils are equal, round, and reactive to light. No scleral icterus.  Neck: Neck supple.  Cardiovascular: Normal rate, regular rhythm and normal heart sounds.  Pulmonary/Chest: Effort normal and breath sounds normal. She has no wheezes. She has no rales.  Abdominal: Soft. Bowel sounds are normal. There is no tenderness.  Lymphadenopathy:    She has no cervical adenopathy.  Neurological: She is alert and oriented to person, place, and time.  Skin: Skin is warm and dry.  Well circumscribed nodule that is mildly tender present on right side of cheek near ear. Nodule is approximately 1 cm in diameter and has a small amount of yellow drainage present. Culture of drainage obtained. Few small scattered areas of eschar present on scalp that appear to be healing that patient has covered with liquid bandaid as she is wearing a wig. No evidence of erythema, edema, or fluctuance appreciated. Hair is growing back related to recent chemotherapy treatment.  Psychiatric: She has a normal mood and affect. Her behavior is normal. Judgment and thought content normal.      Assessment & Plan:  1. Furuncle Well circumscribed nodule present with very small amount of drainage present. Wound culture obtained and with patient's history of chemotherapy treatment and prior history of skin infection noted as MRSA, reviewed up to date and opted to initiate course of doxycycline today and evaluate wound culture. Bactrim was avoided as review of communication with oncology provider noted that bactrim can interfere with chemotherapy and patient was advised to avoid this medication if possible. Patient is feeling well otherwise and will discuss this treatment tomorrow with oncology provider as she is scheduled for chemotherapy. Lesions on scalp are healing and may be related  to folliculitis that is healing. Close return precautions provided.   - doxycycline (VIBRA-TABS) 100 MG tablet; Take 1 tablet (100 mg total) by mouth 2 (two) times daily.  Dispense: 20 tablet; Refill: 0  Delano Metz, FNP-C

## 2018-05-29 ENCOUNTER — Inpatient Hospital Stay: Payer: 59

## 2018-05-29 ENCOUNTER — Encounter: Payer: Self-pay | Admitting: *Deleted

## 2018-05-29 ENCOUNTER — Inpatient Hospital Stay (HOSPITAL_BASED_OUTPATIENT_CLINIC_OR_DEPARTMENT_OTHER): Payer: 59 | Admitting: Urgent Care

## 2018-05-29 VITALS — BP 132/85 | HR 88 | Resp 20

## 2018-05-29 VITALS — BP 114/78 | HR 89 | Temp 97.3°F | Resp 18 | Wt 152.6 lb

## 2018-05-29 DIAGNOSIS — C50411 Malignant neoplasm of upper-outer quadrant of right female breast: Secondary | ICD-10-CM

## 2018-05-29 DIAGNOSIS — Z5111 Encounter for antineoplastic chemotherapy: Secondary | ICD-10-CM | POA: Diagnosis not present

## 2018-05-29 DIAGNOSIS — Z170 Estrogen receptor positive status [ER+]: Secondary | ICD-10-CM

## 2018-05-29 DIAGNOSIS — Z800 Family history of malignant neoplasm of digestive organs: Secondary | ICD-10-CM

## 2018-05-29 DIAGNOSIS — Z7189 Other specified counseling: Secondary | ICD-10-CM

## 2018-05-29 DIAGNOSIS — Z87891 Personal history of nicotine dependence: Secondary | ICD-10-CM | POA: Diagnosis not present

## 2018-05-29 DIAGNOSIS — C50911 Malignant neoplasm of unspecified site of right female breast: Secondary | ICD-10-CM | POA: Diagnosis not present

## 2018-05-29 DIAGNOSIS — Z8614 Personal history of Methicillin resistant Staphylococcus aureus infection: Secondary | ICD-10-CM

## 2018-05-29 DIAGNOSIS — Z85828 Personal history of other malignant neoplasm of skin: Secondary | ICD-10-CM

## 2018-05-29 DIAGNOSIS — C50912 Malignant neoplasm of unspecified site of left female breast: Secondary | ICD-10-CM | POA: Diagnosis not present

## 2018-05-29 DIAGNOSIS — L739 Follicular disorder, unspecified: Secondary | ICD-10-CM

## 2018-05-29 DIAGNOSIS — Z801 Family history of malignant neoplasm of trachea, bronchus and lung: Secondary | ICD-10-CM

## 2018-05-29 DIAGNOSIS — G620 Drug-induced polyneuropathy: Secondary | ICD-10-CM

## 2018-05-29 DIAGNOSIS — T451X5A Adverse effect of antineoplastic and immunosuppressive drugs, initial encounter: Secondary | ICD-10-CM

## 2018-05-29 LAB — CBC WITH DIFFERENTIAL/PLATELET
Basophils Absolute: 0.1 10*3/uL (ref 0–0.1)
Basophils Relative: 2 %
Eosinophils Absolute: 0 10*3/uL (ref 0–0.7)
Eosinophils Relative: 1 %
HCT: 34.2 % — ABNORMAL LOW (ref 35.0–47.0)
Hemoglobin: 12 g/dL (ref 12.0–16.0)
Lymphocytes Relative: 18 %
Lymphs Abs: 0.7 10*3/uL — ABNORMAL LOW (ref 1.0–3.6)
MCH: 29.7 pg (ref 26.0–34.0)
MCHC: 35 g/dL (ref 32.0–36.0)
MCV: 84.8 fL (ref 80.0–100.0)
Monocytes Absolute: 0.4 10*3/uL (ref 0.2–0.9)
Monocytes Relative: 11 %
Neutro Abs: 2.6 10*3/uL (ref 1.4–6.5)
Neutrophils Relative %: 68 %
Platelets: 262 10*3/uL (ref 150–440)
RBC: 4.03 MIL/uL (ref 3.80–5.20)
RDW: 15.4 % — ABNORMAL HIGH (ref 11.5–14.5)
WBC: 3.8 10*3/uL (ref 3.6–11.0)

## 2018-05-29 LAB — COMPREHENSIVE METABOLIC PANEL
ALT: 25 U/L (ref 0–44)
AST: 34 U/L (ref 15–41)
Albumin: 3.8 g/dL (ref 3.5–5.0)
Alkaline Phosphatase: 61 U/L (ref 38–126)
Anion gap: 7 (ref 5–15)
BUN: 12 mg/dL (ref 8–23)
CO2: 24 mmol/L (ref 22–32)
Calcium: 8.6 mg/dL — ABNORMAL LOW (ref 8.9–10.3)
Chloride: 108 mmol/L (ref 98–111)
Creatinine, Ser: 0.92 mg/dL (ref 0.44–1.00)
GFR calc Af Amer: >60 mL/min (ref >60)
GFR calc non Af Amer: >60 mL/min (ref >60)
Glucose, Bld: 110 mg/dL — ABNORMAL HIGH (ref 70–99)
Potassium: 4 mmol/L (ref 3.5–5.1)
Sodium: 139 mmol/L (ref 135–145)
Total Bilirubin: 0.6 mg/dL (ref 0.3–1.2)
Total Protein: 6.2 g/dL — ABNORMAL LOW (ref 6.5–8.1)

## 2018-05-29 LAB — MAGNESIUM: Magnesium: 2 mg/dL (ref 1.7–2.4)

## 2018-05-29 MED ORDER — HEPARIN SOD (PORK) LOCK FLUSH 100 UNIT/ML IV SOLN
500.0000 [IU] | Freq: Once | INTRAVENOUS | Status: AC | PRN
  Administered 2018-05-29: 500 [IU]
  Filled 2018-05-29: qty 5

## 2018-05-29 MED ORDER — DEXAMETHASONE SODIUM PHOSPHATE 100 MG/10ML IJ SOLN
20.0000 mg | Freq: Once | INTRAMUSCULAR | Status: AC
  Administered 2018-05-29: 20 mg via INTRAVENOUS
  Filled 2018-05-29: qty 2

## 2018-05-29 MED ORDER — FAMOTIDINE IN NACL 20-0.9 MG/50ML-% IV SOLN
20.0000 mg | Freq: Once | INTRAVENOUS | Status: AC
  Administered 2018-05-29: 20 mg via INTRAVENOUS
  Filled 2018-05-29: qty 50

## 2018-05-29 MED ORDER — DIPHENHYDRAMINE HCL 50 MG/ML IJ SOLN
50.0000 mg | Freq: Once | INTRAMUSCULAR | Status: AC
Start: 2018-05-29 — End: 2018-05-29
  Administered 2018-05-29: 50 mg via INTRAVENOUS
  Filled 2018-05-29: qty 1

## 2018-05-29 MED ORDER — SODIUM CHLORIDE 0.9 % IV SOLN
Freq: Once | INTRAVENOUS | Status: AC
  Administered 2018-05-29: 09:00:00 via INTRAVENOUS
  Filled 2018-05-29: qty 1000

## 2018-05-29 MED ORDER — SODIUM CHLORIDE 0.9 % IV SOLN
80.0000 mg/m2 | Freq: Once | INTRAVENOUS | Status: AC
  Administered 2018-05-29: 144 mg via INTRAVENOUS
  Filled 2018-05-29: qty 24

## 2018-05-29 NOTE — Progress Notes (Signed)
Patient states she saw her NP this week for the sores on her head and face  She was prescribed doxycycline 2 times daily. States they are much better already.

## 2018-05-29 NOTE — Progress Notes (Signed)
  Oncology Nurse Navigator Documentation  Navigator Location: [facility] (05/29/18 1300)   )Navigator Encounter Type: Clinic/MDC (05/29/18 1300)                       Treatment Phase: Active Tx (05/29/18 1300) Barriers/Navigation Needs: Education (05/29/18 1300)                          Time Spent with Patient: 30 (05/29/18 1300)   Met patient and her daughter today during her chemotherapy treatment.  Patient has questions regarding mastectomy, reconstruction, radiation therapy and post mastectomy supplies.  She is meeting with Dr. Marla Roe today for consultation for reconstruction.  Encouraged to ask questions at her appointment, and then if she is still not sure what she wants to do, she can follow-up with Dr. Mike Gip.  She is agreeable.  Discussed where to get mastectomy supplies.  She will let me know if she needs assistance with getting a prosthesis and bras.

## 2018-05-31 LAB — WOUND CULTURE
MICRO NUMBER:: 90822921
SPECIMEN QUALITY: ADEQUATE

## 2018-06-01 DIAGNOSIS — C50819 Malignant neoplasm of overlapping sites of unspecified female breast: Secondary | ICD-10-CM | POA: Insufficient documentation

## 2018-06-01 DIAGNOSIS — C50911 Malignant neoplasm of unspecified site of right female breast: Secondary | ICD-10-CM | POA: Insufficient documentation

## 2018-06-01 DIAGNOSIS — C50912 Malignant neoplasm of unspecified site of left female breast: Secondary | ICD-10-CM | POA: Insufficient documentation

## 2018-06-05 ENCOUNTER — Inpatient Hospital Stay: Payer: 59

## 2018-06-05 VITALS — BP 125/82 | HR 76 | Temp 97.8°F | Resp 20 | Wt 151.2 lb

## 2018-06-05 DIAGNOSIS — C50411 Malignant neoplasm of upper-outer quadrant of right female breast: Secondary | ICD-10-CM | POA: Diagnosis not present

## 2018-06-05 LAB — CBC WITH DIFFERENTIAL/PLATELET
BASOS PCT: 2 %
Basophils Absolute: 0.1 10*3/uL (ref 0–0.1)
Eosinophils Absolute: 0 10*3/uL (ref 0–0.7)
Eosinophils Relative: 1 %
HCT: 34.7 % — ABNORMAL LOW (ref 35.0–47.0)
Hemoglobin: 11.9 g/dL — ABNORMAL LOW (ref 12.0–16.0)
Lymphocytes Relative: 25 %
Lymphs Abs: 0.9 10*3/uL — ABNORMAL LOW (ref 1.0–3.6)
MCH: 29.3 pg (ref 26.0–34.0)
MCHC: 34.3 g/dL (ref 32.0–36.0)
MCV: 85.4 fL (ref 80.0–100.0)
MONO ABS: 0.4 10*3/uL (ref 0.2–0.9)
MONOS PCT: 11 %
NEUTROS PCT: 61 %
Neutro Abs: 2.2 10*3/uL (ref 1.4–6.5)
Platelets: 272 10*3/uL (ref 150–440)
RBC: 4.06 MIL/uL (ref 3.80–5.20)
RDW: 15.6 % — AB (ref 11.5–14.5)
WBC: 3.5 10*3/uL — ABNORMAL LOW (ref 3.6–11.0)

## 2018-06-05 LAB — COMPREHENSIVE METABOLIC PANEL
ALT: 19 U/L (ref 0–44)
ANION GAP: 9 (ref 5–15)
AST: 24 U/L (ref 15–41)
Albumin: 4 g/dL (ref 3.5–5.0)
Alkaline Phosphatase: 56 U/L (ref 38–126)
BILIRUBIN TOTAL: 1 mg/dL (ref 0.3–1.2)
BUN: 17 mg/dL (ref 8–23)
CO2: 23 mmol/L (ref 22–32)
Calcium: 9.2 mg/dL (ref 8.9–10.3)
Chloride: 107 mmol/L (ref 98–111)
Creatinine, Ser: 0.94 mg/dL (ref 0.44–1.00)
GFR calc non Af Amer: >60 mL/min (ref >60)
GLUCOSE: 107 mg/dL — AB (ref 70–99)
Potassium: 4 mmol/L (ref 3.5–5.1)
Sodium: 139 mmol/L (ref 135–145)
Total Protein: 6.3 g/dL — ABNORMAL LOW (ref 6.5–8.1)

## 2018-06-05 LAB — MAGNESIUM: Magnesium: 1.9 mg/dL (ref 1.7–2.4)

## 2018-06-05 MED ORDER — SODIUM CHLORIDE 0.9 % IV SOLN
Freq: Once | INTRAVENOUS | Status: AC
  Administered 2018-06-05: 11:00:00 via INTRAVENOUS
  Filled 2018-06-05: qty 1000

## 2018-06-05 MED ORDER — DIPHENHYDRAMINE HCL 50 MG/ML IJ SOLN
50.0000 mg | Freq: Once | INTRAMUSCULAR | Status: AC
Start: 2018-06-05 — End: 2018-06-05
  Administered 2018-06-05: 50 mg via INTRAVENOUS
  Filled 2018-06-05: qty 1

## 2018-06-05 MED ORDER — SODIUM CHLORIDE 0.9% FLUSH
10.0000 mL | Freq: Once | INTRAVENOUS | Status: AC
  Administered 2018-06-05: 10 mL via INTRAVENOUS
  Filled 2018-06-05: qty 10

## 2018-06-05 MED ORDER — SODIUM CHLORIDE 0.9 % IV SOLN
20.0000 mg | Freq: Once | INTRAVENOUS | Status: AC
  Administered 2018-06-05: 20 mg via INTRAVENOUS
  Filled 2018-06-05: qty 2

## 2018-06-05 MED ORDER — HEPARIN SOD (PORK) LOCK FLUSH 100 UNIT/ML IV SOLN
500.0000 [IU] | Freq: Once | INTRAVENOUS | Status: AC
  Administered 2018-06-05: 500 [IU] via INTRAVENOUS
  Filled 2018-06-05: qty 5

## 2018-06-05 MED ORDER — FAMOTIDINE IN NACL 20-0.9 MG/50ML-% IV SOLN
20.0000 mg | Freq: Once | INTRAVENOUS | Status: AC
  Administered 2018-06-05: 20 mg via INTRAVENOUS
  Filled 2018-06-05: qty 50

## 2018-06-05 MED ORDER — SODIUM CHLORIDE 0.9 % IV SOLN
80.0000 mg/m2 | Freq: Once | INTRAVENOUS | Status: AC
  Administered 2018-06-05: 144 mg via INTRAVENOUS
  Filled 2018-06-05: qty 24

## 2018-06-12 ENCOUNTER — Inpatient Hospital Stay: Payer: 59

## 2018-06-12 ENCOUNTER — Inpatient Hospital Stay (HOSPITAL_BASED_OUTPATIENT_CLINIC_OR_DEPARTMENT_OTHER): Payer: 59 | Admitting: Hematology and Oncology

## 2018-06-12 ENCOUNTER — Other Ambulatory Visit: Payer: Self-pay

## 2018-06-12 ENCOUNTER — Encounter: Payer: Self-pay | Admitting: Hematology and Oncology

## 2018-06-12 VITALS — BP 128/86 | HR 76 | Temp 97.7°F | Resp 18 | Wt 153.0 lb

## 2018-06-12 DIAGNOSIS — G620 Drug-induced polyneuropathy: Secondary | ICD-10-CM

## 2018-06-12 DIAGNOSIS — T451X5A Adverse effect of antineoplastic and immunosuppressive drugs, initial encounter: Secondary | ICD-10-CM

## 2018-06-12 DIAGNOSIS — C50411 Malignant neoplasm of upper-outer quadrant of right female breast: Secondary | ICD-10-CM

## 2018-06-12 DIAGNOSIS — Z7189 Other specified counseling: Secondary | ICD-10-CM

## 2018-06-12 DIAGNOSIS — D701 Agranulocytosis secondary to cancer chemotherapy: Secondary | ICD-10-CM

## 2018-06-12 DIAGNOSIS — M85852 Other specified disorders of bone density and structure, left thigh: Secondary | ICD-10-CM

## 2018-06-12 DIAGNOSIS — D0512 Intraductal carcinoma in situ of left breast: Secondary | ICD-10-CM

## 2018-06-12 LAB — CBC WITH DIFFERENTIAL/PLATELET
Basophils Absolute: 0.1 10*3/uL (ref 0–0.1)
Basophils Relative: 2 %
Eosinophils Absolute: 0 10*3/uL (ref 0–0.7)
Eosinophils Relative: 1 %
HEMATOCRIT: 36.2 % (ref 35.0–47.0)
HEMOGLOBIN: 12.3 g/dL (ref 12.0–16.0)
LYMPHS ABS: 0.7 10*3/uL — AB (ref 1.0–3.6)
Lymphocytes Relative: 25 %
MCH: 29.2 pg (ref 26.0–34.0)
MCHC: 33.9 g/dL (ref 32.0–36.0)
MCV: 86.1 fL (ref 80.0–100.0)
MONO ABS: 0.3 10*3/uL (ref 0.2–0.9)
MONOS PCT: 11 %
NEUTROS ABS: 1.7 10*3/uL (ref 1.4–6.5)
NEUTROS PCT: 61 %
Platelets: 258 10*3/uL (ref 150–440)
RBC: 4.21 MIL/uL (ref 3.80–5.20)
RDW: 15.3 % — AB (ref 11.5–14.5)
WBC: 2.9 10*3/uL — ABNORMAL LOW (ref 3.6–11.0)

## 2018-06-12 LAB — COMPREHENSIVE METABOLIC PANEL
ALBUMIN: 4 g/dL (ref 3.5–5.0)
ALT: 22 U/L (ref 0–44)
AST: 25 U/L (ref 15–41)
Alkaline Phosphatase: 58 U/L (ref 38–126)
Anion gap: 8 (ref 5–15)
BUN: 12 mg/dL (ref 8–23)
CALCIUM: 8.7 mg/dL — AB (ref 8.9–10.3)
CHLORIDE: 106 mmol/L (ref 98–111)
CO2: 24 mmol/L (ref 22–32)
Creatinine, Ser: 0.89 mg/dL (ref 0.44–1.00)
GFR calc non Af Amer: >60 mL/min (ref >60)
GLUCOSE: 115 mg/dL — AB (ref 70–99)
Potassium: 4.1 mmol/L (ref 3.5–5.1)
SODIUM: 138 mmol/L (ref 135–145)
Total Bilirubin: 0.8 mg/dL (ref 0.3–1.2)
Total Protein: 6.4 g/dL — ABNORMAL LOW (ref 6.5–8.1)

## 2018-06-12 LAB — VITAMIN B12: Vitamin B-12: 762 pg/mL (ref 180–914)

## 2018-06-12 LAB — MAGNESIUM: MAGNESIUM: 2.3 mg/dL (ref 1.7–2.4)

## 2018-06-12 LAB — FOLATE: Folate: 32 ng/mL (ref >5.9)

## 2018-06-12 MED ORDER — GABAPENTIN 100 MG PO CAPS: 100.0000 mg | ORAL_CAPSULE | Freq: Every day | ORAL | 0 refills | Status: DC

## 2018-06-12 NOTE — Progress Notes (Signed)
[facility] day:  06/12/2018   Chief Complaint: Megan Vega is a 61 y.o. female with stage IIIB right breast cancer and DCIS of the left breast who is seen for assessment following completion of neoadjuvant Taxol.  HPI: The patient was last seen in the medical oncology clinic by me on 05/15/2018.  At that time, she was doing well.  She had a grade I chemotherapy induced neuropathy.  Exam was stable.  WBC was 3200 ([place] 1900).  Potassium was 3.9.  She received week #9 Taxol.  She was seen by Honor Loh, NP on 05/29/2018.  She noted issues with "boils" on her head for which she was on doxycycline.  She completed 12 weeks of Taxol on 06/05/2018.  Counts included a hematocrit of 34.7, hemoglobin 11.9, MCV 85.4, platelets 272,000, WBC 3500 with an ANC of 2200.  She met with Audelia Hives, plastic surgeon, on 05/29/2018. TRAM vs. latissimus reconstruction was discussed. Patient will need to undergo radiation therapy prior to being considered for reconstruction. Patient agreed with plan.   Symptomatically, patient is doing well overall. She notes an acute increase in the neuropathy in her hands for about 2 weeks. She notes that her symptom is causing her to drop things more often.  Patient denies that she has experienced any B symptoms. She denies any interval infections.   Patient advises that she maintains an adequate appetite. She is eating well. Weight today is 153 lb (69.4 kg), which compared to her last visit to the clinic, represents a 1 pound increase.  Patient denies pain in the clinic today.   Past Medical History:  Diagnosis Date  . Ankle fracture   . Cancer [facility]    Basal Cell Carcinoma, about 2011 chest  . Cataract   . Family history of adverse reaction to anesthesia    DAUGHTER-N/V  . Hemorrhoids   . History of methicillin resistant staphylococcus aureus (MRSA) 2015  . Migraines    MIGRAINES    Past Surgical History:    Procedure Laterality Date  . BREAST BIOPSY Bilateral 10/29/2017   L breast DCIS, R breast invasive mammary carcinoma of no special type, ER/PR+, Her2/neu 1+  . CESAREAN SECTION    . COLONOSCOPY  2012  . IRRIGATION AND DEBRIDEMENT ABSCESS Right 03/12/2018   Procedure: IRRIGATION AND DEBRIDEMENT RIGHT AXILLARY ABSCESS;  Surgeon: Robert Bellow, MD;  Location: [facility] ORS;  Service: General;  Laterality: Right;  . MANDIBLE FRACTURE SURGERY    . PORTACATH PLACEMENT Left 11/28/2017   Procedure: INSERTION PORT-A-CATH;  Surgeon: Robert Bellow, MD;  Location: [facility] ORS;  Service: General;  Laterality: Left;    Family History  Problem Relation Age of Onset  . Cancer Mother        Esophageal Cancer  . Early death Mother        Age 9  . Cancer Father        Lung Cancer  . Diabetes Father   . Diabetes Brother        Controlled with diet  . Heart attack Paternal Grandfather     Social History:  reports that she quit smoking about 41 years ago. She has a 2.50 pack-year smoking history. She has never used smokeless tobacco. She reports that she drinks about 0.6 oz of alcohol per week. She reports that she does not use drugs.  Patient is a former smoker for 20 years; only smoked "about 3 cigarettes a day"; stopped 30 years  ago. Patient drinks alcohol very infrequently; "about 1 glass a month". Patient currently employed as an Web designer at [employer]. She denies any known exposures to radiations or toxins.  She lives in [place].  Her grand daughter is graduating in 03/2018.  She has one daughter Megan Vega).  She is accompanied by her daughter  today.  Allergies:  Allergies  Allergen Reactions  . Peanut-Containing Drug Products Swelling and Other (See Comments)    Only when eating too many peanuts, swelling with lips and tingly face    Current Medications: Current Outpatient Medications  Medication Sig Dispense Refill  . acetaminophen (TYLENOL) 500 MG tablet Take  1,000 mg by mouth every 6 (six) hours as needed for moderate pain or headache.    . b complex vitamins tablet Take 1 tablet by mouth daily.    Marland Kitchen doxycycline (VIBRA-TABS) 100 MG tablet Take 1 tablet (100 mg total) by mouth 2 (two) times daily. 20 tablet 0  . lidocaine-prilocaine (EMLA) cream Apply to affected area once 30 g 3  . loratadine (CLARITIN) 10 MG tablet Take 10 mg by mouth daily.    Marland Kitchen LORazepam (ATIVAN) 0.5 MG tablet Take 1 tablet (0.5 mg total) by mouth every 6 (six) hours as needed (Nausea or vomiting). 30 tablet 0  . Multiple Vitamins-Minerals (HAIR/SKIN/NAILS/BIOTIN PO) Take 1 tablet by mouth daily.     . ondansetron (ZOFRAN) 8 MG tablet Take 1 tablet (8 mg total) by mouth 2 (two) times daily as needed. Start on the third day after chemotherapy. 30 tablet 1  . polyethylene glycol (MIRALAX / GLYCOLAX) packet Take 17 g by mouth daily as needed for mild constipation.     No current facility-administered medications for this visit.    Facility-Administered Medications Ordered in Other Visits  Medication Dose Route Frequency Provider Last Rate Last Dose  . heparin lock flush 100 unit/mL  500 Units Intracatheter PRN Lequita Asal, MD        Review of Systems  Constitutional: Negative for diaphoresis, fever, malaise/fatigue and weight loss (up 2 pounds).       "I feel good".  HENT: Negative.  Negative for congestion, ear discharge, ear pain, hearing loss, nosebleeds, sinus pain, sore throat and tinnitus.   Eyes: Negative.  Negative for blurred vision, double vision, pain, discharge and redness.  Respiratory: Negative.  Negative for cough, hemoptysis, sputum production and shortness of breath.   Cardiovascular: Negative.  Negative for chest pain, palpitations, orthopnea, leg swelling and PND.  Gastrointestinal: Negative.  Negative for abdominal pain, blood in stool, constipation, diarrhea, heartburn, melena, nausea and vomiting.  Genitourinary: Negative.  Negative for dysuria,  frequency, hematuria and urgency.  Musculoskeletal: Negative.  Negative for back pain, falls and joint pain.  Skin: Negative.  Negative for itching and rash.  Neurological: Positive for tingling (increased in hands - dropping things). Negative for dizziness, tremors (essential), speech change and weakness. Headaches: migraines; chronic.  Endo/Heme/Allergies: Negative.   Psychiatric/Behavioral: Negative for memory loss. The patient is not nervous/anxious and does not have insomnia.   All other systems reviewed and are negative.  Vitals signs: BP 128/86 (BP Location: Right Arm, Patient Position: Sitting)   Pulse 76   Temp 97.7 F (36.5 C) (Tympanic)   Resp 18   Wt 153 lb (69.4 kg)   BMI 25.46 kg/m   Physical Exam  Constitutional: She is oriented to person, place, and time and well-developed, well-nourished, and in no distress. No distress.  HENT:  Head: Normocephalic.  Mouth/Throat: Oropharynx is clear and moist. No oropharyngeal exudate.  Short strawberry blonde wig.  Eyes: Pupils are equal, round, and reactive to light. Conjunctivae and EOM are normal. No scleral icterus.  Hazel/green eyes  Neck: Normal range of motion. Neck supple. No JVD present. No tracheal deviation present.  Cardiovascular: Normal rate, regular rhythm and normal heart sounds. Exam reveals no gallop and no friction rub.  No murmur heard. Pulmonary/Chest: Effort normal and breath sounds normal. No respiratory distress. She has no wheezes. She has no rales.  Abdominal: Soft. Bowel sounds are normal. She exhibits no mass. There is no tenderness.  Musculoskeletal: Normal range of motion. She exhibits no edema or tenderness.  Lymphadenopathy:       Head (right side): No submandibular, no preauricular, no posterior auricular and no occipital adenopathy present.       Head (left side): No submandibular, no preauricular, no posterior auricular and no occipital adenopathy present.    She has no cervical adenopathy.     She has no axillary adenopathy.       Right: No inguinal and no supraclavicular adenopathy present.       Left: No inguinal and no supraclavicular adenopathy present.  Neurological: She is alert and oriented to person, place, and time. Gait normal.  Skin: Skin is warm, dry and intact. No rash noted. She is not diaphoretic. No pallor.     Psychiatric: Mood, memory, affect and judgment normal.   Results for orders placed or performed in visit on 06/12/18 (from the past 24 hour(s))  Magnesium     Status: None   Collection Time: 06/12/18 10:33 AM  Result Value Ref Range   Magnesium 2.3 1.7 - 2.4 mg/dL  Comprehensive metabolic panel     Status: Abnormal   Collection Time: 06/12/18 10:33 AM  Result Value Ref Range   Sodium 138 135 - 145 mmol/L   Potassium 4.1 3.5 - 5.1 mmol/L   Chloride 106 98 - 111 mmol/L   CO2 24 22 - 32 mmol/L   Glucose, Bld 115 (H) 70 - 99 mg/dL   BUN 12 8 - 23 mg/dL   Creatinine, Ser 0.89 0.44 - 1.00 mg/dL   Calcium 8.7 (L) 8.9 - 10.3 mg/dL   Total Protein 6.4 (L) 6.5 - 8.1 g/dL   Albumin 4.0 3.5 - 5.0 g/dL   AST 25 15 - 41 U/L   ALT 22 0 - 44 U/L   Alkaline Phosphatase 58 38 - 126 U/L   Total Bilirubin 0.8 0.3 - 1.2 mg/dL   GFR calc non Af Amer >60 >60 mL/min   GFR calc Af Amer >60 >60 mL/min   Anion gap 8 5 - 15  CBC with Differential     Status: Abnormal   Collection Time: 06/12/18 10:33 AM  Result Value Ref Range   WBC 2.9 (L) 3.6 - 11.0 K/uL   RBC 4.21 3.80 - 5.20 MIL/uL   Hemoglobin 12.3 12.0 - 16.0 g/dL   HCT 36.2 35.0 - 47.0 %   MCV 86.1 80.0 - 100.0 fL   MCH 29.2 26.0 - 34.0 pg   MCHC 33.9 32.0 - 36.0 g/dL   RDW 15.3 (H) 11.5 - 14.5 %   Platelets 258 150 - 440 K/uL   Neutrophils Relative % 61 %   Neutro Abs 1.7 1.4 - 6.5 K/uL   Lymphocytes Relative 25 %   Lymphs Abs 0.7 (L) 1.0 - 3.6 K/uL   Monocytes Relative 11 %   Monocytes Absolute  0.3 0.2 - 0.9 K/uL   Eosinophils Relative 1 %   Eosinophils Absolute 0.0 0 - 0.7 K/uL   Basophils  Relative 2 %   Basophils Absolute 0.1 0 - 0.1 K/uL     Assessment:  Jeane Cashatt is a 62 y.o. female with clinical stage [id number] right breast cancer and DCIS in the left breast s/p ultrasound guided biopsy on 10/29/2017. Pathology from the right breast at the 10 o'clock position 3 cm from the nipple revealed a 1.4 cm grade II invasive mammary carcinoma of no special type.  Tumor was ER + (> 90%), PR + (90%) and Her2/neu 1+.  Right axillary node biopsy revealed metastatic carcinoma.  Left breast biopsy at the 2 o'clock position 3 cm from the nipple revealed high grade DCIS with comedonecrosis.  CA27.29 was 23.5 on 11/14/2017.  Bilateral diagnostic mammogram and ultrasound on 10/23/2017 revealed a 3.9 x 1.7 x 2.2 cm irregular hypoechoic mass with associated areas of vascularity at the 10 o'clock position 3 cm from the nipple and a 1.1 x 0.5 x 0.4 cm area of nodularity (3 approximately 3 mm nodules at 10 o'clock 6 cm from the nipple in the right breast. The mass was within a 5 cm area of pleomorphic microcalcifications.  Ultrasound revealed at least 5 prominent lymph nodes with diffuse thickened and hypoechoic cortices suggestive of metastatic disease.  In the left breast, there was an irregular 1.6 x 1.3 x 1.2 cm hypoechoic mass with indistinct margins at 2 o'clock 3 cm from the nipple.  Axillary ultrasound revealed normal nodes.  Bilateral breast MRI on 11/17/2017 revealed a 4.3 x 4.8 x 5 cm mass on the right centered in the upper outer quadrant but also extends into the lower outer and upper inner quadrants.  There was a small amount of enhancement posterior to the right nipple which could represent subtle extension. There was a cluster of nodules located posterior to the superior aspect of the known malignancy and another small nodule located posterior to the inferior aspect of the malignancy which could represent satellite lesions. Taking the enhancement posterior to the right nipple and the potential  posterior satellite lesions into account, the AP dimension could be as large as 7 cm.  The patient's known DCIS spans 5 x 5 x 4.4 cm mammographically based on calcifications. The associated enhancement spans 3.2 x 3.2 x 3.3 cm. However, there were 2 small linearly enhancing regions at 6 o'clock in the left breast which could represent further disease.  There was extensive adenopathy in the right axilla and right retropectoral region. There was at least 1 significantly enlarged right internal mammary lymph node.  PET scan on 11/24/2017 revealed hypermetabolic right axillary (2.6 x 3.1 cm; SUV 8.0), subpectoral/internal mammary (7 mm; SUV 2.4) adenopathy.  There was a 4 mm right lower lobe nodule is too small for PET resolution.  There was cholelithiasis.  She received 4 cycles of neoadjuvant AC (12/05/2017 - 01/30/2018) with OnPro Neulasta support.  She received 12 weeks of neoadjuvant Taxol (02/13/2018 - 02/27/2018; 04/10/2018 - 06/05/2018).  Week #4 was delayed secondary to a right axillary abscess and neutropenia.   Breast ultrasound on 02/18/2018 revealed a 1.0 x 1.36 x 2.14 cm dominant mass in the upper outer quadrant of the right breast (>50% volume decrease).  Axillary lymph nodes showed marked improvement with only 2 of  5 previous identified  lymph nodes identifiable (largest 1.3 cm; smaller node with cortical thickness 0.39 compared to 0.99).  Bone density  on 12/16/2016 revealed osteopenia with a T-score of -1.6 in the left femoral neck on 12/16/2017.  She developed a right axillary abscess and underwent incision and drainage on 03/12/2018.  Culture grew staph aureus.  She completed Bactrim on 03/26/2018.  Symptomatically, she has progressive grade II chemotherapy induced neuropathy.  Exam is stable.  WBC is 2900 (ANC 1700).  Potassium is 4.1.  Plan: 1. Labs today: CBC with diff, CMP, Mg, B12, folate 2. Breast cancer:  Discuss completion of chemotherapy and plan for surgery, reconstruction,  and radiation.  Discuss plans for surgery.  Anticipate surgery to be done the middle of 06/2018. Patient to follow up with Dr. Bary Castilla and Dr. Marla Roe.    Anticipate reconstruction in 6 months- 1 year.  Anticipate endocrine therapy after completion of radiation.  Anticipate use of Prolia to prevent bone loss with an aromatase inhibitor. 3. Chemotherapy induced neuropathy:  Check B12 and folate today.   Start gabapentin 100 mg qhs. Titrate dose.  Refer to neurology Manuella Ghazi, MD).  4. Chemotherapy induced leukopenia:  Anticipate recovery of counts post chemotherapy.  Continue neutropenic precautions. 5. RTC 2 weeks after surgery for MD assessment and labs (CBC with diff, CMP, CA27.29).    Honor Loh, NP 06/12/2018, 11:36 AM   I saw and evaluated the patient, participating in the key portions of the service and reviewing pertinent diagnostic studies and records.  I reviewed the nurse practitioner's note and agree with the findings and the plan.  The assessment and plan were discussed with the patient. Numerous questions were asked by the patient and answered.   Nolon Stalls, MD 06/12/2018,11:36 AM

## 2018-06-12 NOTE — Progress Notes (Signed)
Patient c/o neuropathy in hands and feet.  She is asking about acupuncture.  Exertional SOB (climbing stairs, etc).

## 2018-06-15 ENCOUNTER — Telehealth: Payer: Self-pay

## 2018-06-15 ENCOUNTER — Other Ambulatory Visit: Payer: Self-pay

## 2018-06-15 DIAGNOSIS — D0512 Intraductal carcinoma in situ of left breast: Secondary | ICD-10-CM

## 2018-06-15 DIAGNOSIS — C50411 Malignant neoplasm of upper-outer quadrant of right female breast: Secondary | ICD-10-CM

## 2018-06-15 NOTE — Telephone Encounter (Signed)
-----   Message from Robert Bellow, MD sent at 06/12/2018  9:13 PM EDT ----- The patient can be scheduled for bilateral mastectomy with bilateral sentinel node biopsies.  Preoperative appointment will be appropriate.

## 2018-06-15 NOTE — Telephone Encounter (Signed)
Call to patient to see about scheduling surgery. The patient is scheduled for surgery at [facility] on 07/10/18. She will pre admit by phone. She is scheduled to be seen in office for a pre op appointment on 06/30/18 at 11:30 am.

## 2018-06-16 ENCOUNTER — Other Ambulatory Visit: Payer: Self-pay | Admitting: General Surgery

## 2018-06-16 DIAGNOSIS — C50411 Malignant neoplasm of upper-outer quadrant of right female breast: Secondary | ICD-10-CM

## 2018-06-17 ENCOUNTER — Other Ambulatory Visit: Payer: Self-pay

## 2018-06-17 ENCOUNTER — Telehealth: Payer: Self-pay

## 2018-06-17 MED ORDER — LIDOCAINE-PRILOCAINE 2.5-2.5 % EX CREA: 1.0000 "application " | TOPICAL_CREAM | CUTANEOUS | 0 refills | Status: DC | PRN

## 2018-06-17 NOTE — Telephone Encounter (Signed)
Patient notified of directions for EMLA cream. She will report to the radiology desk the morning of surgery at 7:45 am. She is aware of date, time, and instructions.

## 2018-06-17 NOTE — Telephone Encounter (Signed)
-----   Message from Robert Bellow, MD sent at 06/16/2018  8:52 PM EDT ----- Please request that the patient make use of EMLA cream to both areolas the morning of surgery to minimize discomfort during sentinel node injection.

## 2018-06-30 ENCOUNTER — Ambulatory Visit (INDEPENDENT_AMBULATORY_CARE_PROVIDER_SITE_OTHER): Payer: 59 | Admitting: General Surgery

## 2018-06-30 ENCOUNTER — Encounter: Payer: Self-pay | Admitting: General Surgery

## 2018-06-30 VITALS — BP 100/72 | HR 83 | Resp 16 | Ht 65.0 in | Wt 153.0 lb

## 2018-06-30 DIAGNOSIS — C50411 Malignant neoplasm of upper-outer quadrant of right female breast: Secondary | ICD-10-CM | POA: Diagnosis not present

## 2018-06-30 DIAGNOSIS — D0512 Intraductal carcinoma in situ of left breast: Secondary | ICD-10-CM | POA: Diagnosis not present

## 2018-06-30 NOTE — Patient Instructions (Signed)
The patient is aware to call back for any questions or new concerns.  

## 2018-06-30 NOTE — Progress Notes (Signed)
Patient ID: Megan Vega, female   DOB: 15-Oct-1956, 62 y.o.   MRN: [id number]  Chief Complaint  Patient presents with  . Pre-op Exam    HPI Megan Vega is a 62 y.o. female here for her pre op exam for bilateral mastectomy surgery scheduled for 07/10/18.  She has talked with Dr Marla Roe and breast reconstruction will be delayed. Bra 34 C.  Chest circumference 36 cm when measured today. She is here with her daughter, Megan Vega.  HPI  Past Medical History:  Diagnosis Date  . Ankle fracture   . Cancer [facility])    Basal Cell Carcinoma, about 2011 chest  . Cataract   . Family history of adverse reaction to anesthesia    DAUGHTER-N/V  . Hemorrhoids   . History of methicillin resistant staphylococcus aureus (MRSA) 2015  . Migraines    MIGRAINES    Past Surgical History:  Procedure Laterality Date  . BREAST BIOPSY Bilateral 10/29/2017   L breast DCIS, R breast invasive mammary carcinoma of no special type, ER/PR+, Her2/neu 1+  . CESAREAN SECTION    . COLONOSCOPY  2012  . IRRIGATION AND DEBRIDEMENT ABSCESS Right 03/12/2018   Procedure: IRRIGATION AND DEBRIDEMENT RIGHT AXILLARY ABSCESS;  Surgeon: Robert Bellow, MD;  Location: [facility] ORS;  Service: General;  Laterality: Right;  . MANDIBLE FRACTURE SURGERY    . PORTACATH PLACEMENT Left 11/28/2017   Procedure: INSERTION PORT-A-CATH;  Surgeon: Robert Bellow, MD;  Location: [facility] ORS;  Service: General;  Laterality: Left;    Family History  Problem Relation Age of Onset  . Cancer Mother        Esophageal Cancer  . Early death Mother        Age 32  . Cancer Father        Lung Cancer  . Diabetes Father   . Diabetes Brother        Controlled with diet  . Heart attack Paternal Grandfather     Social History Social History   Tobacco Use  . Smoking status: Former Smoker    Packs/day: 0.25    Years: 10.00    Pack years: 2.50    Last attempt to quit: 1978    Years since quitting: 41.6  . Smokeless tobacco: Never Used   Substance Use Topics  . Alcohol use: Yes    Alcohol/week: 1.0 standard drinks    Types: 1 Glasses of wine per week    Comment: RARE  . Drug use: No    Allergies  Allergen Reactions  . Peanut-Containing Drug Products Swelling and Other (See Comments)    Only when eating too many peanuts, swelling with lips and tingly face    Current Outpatient Medications  Medication Sig Dispense Refill  . acetaminophen (TYLENOL) 500 MG tablet Take 1,000 mg by mouth every 6 (six) hours as needed for moderate pain or headache.    . b complex vitamins tablet Take 1 tablet by mouth daily.    Marland Kitchen CALCIUM PO Take 1 tablet by mouth daily.    Marland Kitchen gabapentin (NEURONTIN) 100 MG capsule Take 1 capsule (100 mg total) by mouth at bedtime. 30 capsule 0  . lidocaine-prilocaine (EMLA) cream Apply 1 application topically as needed. Apply to both areolas and cover with plastic wrap one hour prior to leaving for surgery. 5 g 0  . Multiple Vitamin (MULTIVITAMIN WITH MINERALS) TABS tablet Take 1 tablet by mouth daily.    . polyethylene glycol (MIRALAX / GLYCOLAX) packet Take 17 g by mouth daily  as needed (for constipation.).     Marland Kitchen Probiotic Product (PROBIOTIC PO) Take 1 capsule by mouth daily.    Marland Kitchen tetrahydrozoline 0.05 % ophthalmic solution Place 1 drop into both eyes 3 (three) times daily as needed (for dry/red eyes.).     No current facility-administered medications for this visit.    Facility-Administered Medications Ordered in Other Visits  Medication Dose Route Frequency Provider Last Rate Last Dose  . heparin lock flush 100 unit/mL  500 Units Intracatheter PRN Lequita Asal, MD        Review of Systems Review of Systems  Constitutional: Negative.   Respiratory: Negative.   Cardiovascular: Negative.     Blood pressure 100/72, pulse 83, resp. rate 16, height _0  (1.651 m), weight 153 lb (69.4 kg), SpO2 99 %.  Physical Exam Physical Exam  Constitutional: She is oriented to person, place, and time.  She appears well-developed and well-nourished.  HENT:  Mouth/Throat: Oropharynx is clear and moist.  Eyes: Conjunctivae are normal. No scleral icterus.  Neck: Neck supple.  Cardiovascular: Normal rate, regular rhythm and normal heart sounds.  Pulmonary/Chest: Effort normal and breath sounds normal. Right breast exhibits no inverted nipple, no mass, no nipple discharge, no skin change and no tenderness. Left breast exhibits no inverted nipple, no mass, no nipple discharge, no skin change and no tenderness.    Lymphadenopathy:    She has no cervical adenopathy.    She has no axillary adenopathy.  Neurological: She is alert and oriented to person, place, and time.  Skin: Skin is warm and dry.  Psychiatric: Her behavior is normal.    Data Reviewed Plastic surgery notes of May 29, 2018 reviewed.  Measurement of the upper extremities was completed at the location 15 cm above as well as 10 and 20 cm below the olecranon process.  Right: 31, 25.5 and 19 cm.  Left: 31, 24.5, 17.5 cm.    Assessment    Extensive DCIS involving the left breast necessitating mastectomy.  Very good response to neoadjuvant chemotherapy of the right breast where the original tumor measured more than 3.5 cm.  Secondary to her small breast volume candidate for mastectomy rather than breast conservation.    Plan    Plans at present are for delayed reconstruction.  Depending on response to neoadjuvant chemotherapy the patient may require right axillary dissection.  Left simple mastectomy with sentinel node biopsy will be completed.     HPI, Physical Exam, Assessment and Plan have been scribed under the direction and in the presence of Robert Bellow, MD. Karie Fetch, RN  I have completed the exam and reviewed the above documentation for accuracy and completeness.  I agree with the above.  Haematologist has been used and any errors in dictation or transcription are unintentional.  Hervey Ard, M.D., F.A.C.S.  Megan Vega 07/01/2018, 6:52 PM

## 2018-07-03 ENCOUNTER — Encounter
Admission: RE | Admit: 2018-07-03 | Discharge: 2018-07-03 | Disposition: A | Discharge status: Home / Self Care | Payer: 59 | Source: Ambulatory Visit | Attending: General Surgery | Admitting: General Surgery

## 2018-07-03 NOTE — Patient Instructions (Signed)
Your procedure is scheduled on: 07-10-18  Report to [facility] @ 7:45 AM PER PT  Remember: Instructions that are not followed completely may result in serious medical risk, up to and including death, or upon the discretion of your surgeon and anesthesiologist your surgery may need to be rescheduled.    _x___ 1. Do not eat food after midnight the night before your procedure. You may drink clear liquids up to 2 hours before you are scheduled to arrive at the hospital for your procedure.  Do not drink clear liquids within 2 hours of your scheduled arrival to the hospital.  Clear liquids include  --Water or Apple juice without pulp  --Clear carbohydrate beverage such as ClearFast or Gatorade  --Black Coffee or Clear Tea (No milk, no creamers, do not add anything to the coffee or Tea   ____Ensure clear carbohydrate drink on the way to the hospital for bariatric patients  ____Ensure clear carbohydrate drink 3 hours before surgery for Dr Dwyane Luo patients if physician instructed.   No gum chewing or hard candies.     __x__ 2. No Alcohol for 24 hours before or after surgery.   __x__3. No Smoking or e-cigarettes for 24 prior to surgery.  Do not use any chewable tobacco products for at least 6 hour prior to surgery   ____  4. Bring all medications with you on the day of surgery if instructed.    __x__ 5. Notify your doctor if there is any change in your medical condition     (cold, fever, infections).    x___6. On the morning of surgery brush your teeth with toothpaste and water.  You may rinse your mouth with mouth wash if you wish.  Do not swallow any toothpaste or mouthwash.   Do not wear jewelry, make-up, hairpins, clips or nail polish.  Do not wear lotions, powders, or perfumes. You may wear deodorant.  Do not shave 48 hours prior to surgery. Men may shave face and neck.  Do not bring valuables to the hospital.    [facility] is not responsible for any belongings or  valuables.               Contacts, dentures or bridgework may not be worn into surgery.  Leave your suitcase in the car. After surgery it may be brought to your room.  For patients admitted to the hospital, discharge time is determined by your treatment team.  _  Patients discharged the day of surgery will not be allowed to drive home.  You will need someone to drive you home and stay with you the night of your procedure.    Please read over the following fact sheets that you were given:   [facility] Preparing for Surgery   ____ Take anti-hypertensive listed below, cardiac, seizure, asthma, anti-reflux and psychiatric medicines. These include:  1. NONE  2.  3.  4.  5.  6.  ____Fleets enema or Magnesium Citrate as directed.   ____ Use CHG Soap or sage wipes as directed on instruction sheet   ____ Use inhalers on the day of surgery and bring to hospital day of surgery  ____ Stop Metformin and Janumet 2 days prior to surgery.    ____ Take 1/2 of usual insulin dose the night before surgery and none on the morning surgery.   ____ Follow recommendations from Cardiologist, Pulmonologist or PCP regarding stopping Aspirin, Coumadin, Plavix ,Eliquis, Effient, or Pradaxa, and Pletal.  ____Stop Anti-inflammatories such as  Advil, Aleve, Ibuprofen, Motrin, Naproxen, Naprosyn, Goodies powders or aspirin products. OK to take Tylenol    ____ Stop supplements until after surgery.   ____ Bring C-Pap to the hospital.

## 2018-07-05 IMAGING — CT NM PET TUM IMG INITIAL (PI) SKULL BASE T - THIGH
1 of 9 series · 1 of 25 positions shown · non-contrast
Comparison: None.

CLINICAL DATA: Initial treatment strategy for right breast cancer.

EXAM:
NUCLEAR MEDICINE PET SKULL BASE TO THIGH
TECHNIQUE: 12.6 mCi F-18 FDG was injected intravenously. Full-ring PET imaging
was performed from the skull base to thigh after the radiotracer. CT
data was obtained and used for attenuation correction and anatomic
localization.
FASTING BLOOD GLUCOSE:  Value: 101 mg/dl

[Series 3: ct wb 5.0 b30f · axial · 5.0mm · 0.98mm/px · 1 of 290 slices shown]
[im 290/290  brain]
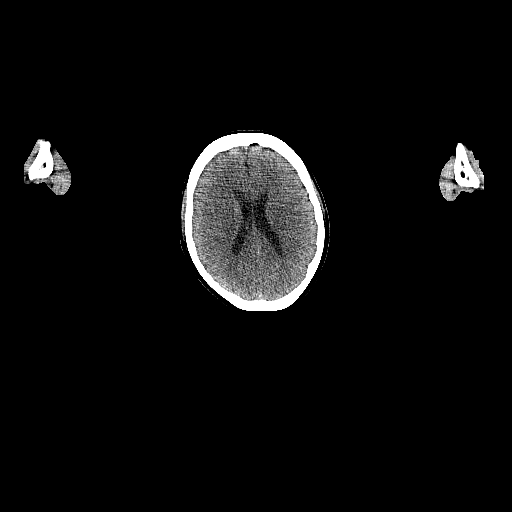

[1 of 25 positions shown; findings below may reference images not displayed]

FINDINGS: NECK: No hypermetabolic lymph nodes in the neck. CT images show no
acute findings.

CHEST: There are numerous enlarged right subpectoral and right
axillary lymph nodes. Index right axillary lymph node measures 2.6 x
3.1 cm with an SUV max of 8.0. Right internal mammary lymph node
measures 7 mm (CT image 104) with an SUV max of 2.4. No
hypermetabolic pulmonary nodules.

No pericardial or pleural effusion. 4 mm peripheral right lower lobe
nodule is too small for PET resolution (series 3, image 118).

ABDOMEN/PELVIS: No abnormal hypermetabolism in the liver, adrenal
glands, spleen or pancreas. No hypermetabolic lymph nodes.

Liver is unremarkable. A large stone is seen in the gallbladder.
Adrenal glands, kidneys, spleen, pancreas, stomach and bowel are
grossly unremarkable. No free fluid.

SKELETON: No abnormal osseous hypermetabolism.
IMPRESSION: 1. Hypermetabolic right axillary/subpectoral/internal mammary
adenopathy.
2. 4 mm right lower lobe nodule is too small for PET resolution.
3. Cholelithiasis.
Hypermetabolic right subpectoral, right axillary and right internal
mammary adenopathy.

## 2018-07-06 ENCOUNTER — Other Ambulatory Visit: Payer: Self-pay | Admitting: Urgent Care

## 2018-07-07 ENCOUNTER — Encounter: Payer: Self-pay | Admitting: Urgent Care

## 2018-07-09 MED ORDER — CEFAZOLIN SODIUM-DEXTROSE 2-4 GM/100ML-% IV SOLN
2.0000 g | INTRAVENOUS | Status: AC
  Administered 2018-07-10: 2 g via INTRAVENOUS

## 2018-07-10 ENCOUNTER — Encounter
Admission: RE | Disposition: A | Discharge status: Home / Self Care | Payer: Self-pay | Source: Ambulatory Visit | Attending: General Surgery

## 2018-07-10 ENCOUNTER — Encounter
Admission: RE | Admit: 2018-07-10 | Discharge: 2018-07-10 | Disposition: A | Discharge status: Home / Self Care | Payer: 59 | Source: Ambulatory Visit | Attending: General Surgery | Admitting: General Surgery

## 2018-07-10 ENCOUNTER — Encounter: Payer: Self-pay | Admitting: *Deleted

## 2018-07-10 ENCOUNTER — Ambulatory Visit: Payer: 59 | Admitting: Certified Registered Nurse Anesthetist

## 2018-07-10 ENCOUNTER — Ambulatory Visit
Admission: RE | Admit: 2018-07-10 | Discharge: 2018-07-10 | Disposition: A | Discharge status: Home / Self Care | Payer: 59 | Source: Ambulatory Visit | Attending: General Surgery | Admitting: General Surgery

## 2018-07-10 ENCOUNTER — Other Ambulatory Visit: Payer: Self-pay

## 2018-07-10 DIAGNOSIS — Z9221 Personal history of antineoplastic chemotherapy: Secondary | ICD-10-CM | POA: Diagnosis not present

## 2018-07-10 DIAGNOSIS — C50411 Malignant neoplasm of upper-outer quadrant of right female breast: Secondary | ICD-10-CM | POA: Diagnosis not present

## 2018-07-10 DIAGNOSIS — D0512 Intraductal carcinoma in situ of left breast: Secondary | ICD-10-CM

## 2018-07-10 DIAGNOSIS — N6489 Other specified disorders of breast: Secondary | ICD-10-CM | POA: Insufficient documentation

## 2018-07-10 DIAGNOSIS — C50912 Malignant neoplasm of unspecified site of left female breast: Secondary | ICD-10-CM | POA: Diagnosis not present

## 2018-07-10 DIAGNOSIS — C50412 Malignant neoplasm of upper-outer quadrant of left female breast: Secondary | ICD-10-CM | POA: Diagnosis not present

## 2018-07-10 DIAGNOSIS — Z87891 Personal history of nicotine dependence: Secondary | ICD-10-CM | POA: Insufficient documentation

## 2018-07-10 DIAGNOSIS — C773 Secondary and unspecified malignant neoplasm of axilla and upper limb lymph nodes: Secondary | ICD-10-CM | POA: Diagnosis not present

## 2018-07-10 DIAGNOSIS — D241 Benign neoplasm of right breast: Secondary | ICD-10-CM | POA: Insufficient documentation

## 2018-07-10 DIAGNOSIS — C50911 Malignant neoplasm of unspecified site of right female breast: Secondary | ICD-10-CM | POA: Diagnosis not present

## 2018-07-10 DIAGNOSIS — Z85828 Personal history of other malignant neoplasm of skin: Secondary | ICD-10-CM | POA: Insufficient documentation

## 2018-07-10 DIAGNOSIS — Z79899 Other long term (current) drug therapy: Secondary | ICD-10-CM | POA: Diagnosis not present

## 2018-07-10 DIAGNOSIS — D242 Benign neoplasm of left breast: Secondary | ICD-10-CM | POA: Insufficient documentation

## 2018-07-10 DIAGNOSIS — Z8614 Personal history of Methicillin resistant Staphylococcus aureus infection: Secondary | ICD-10-CM | POA: Diagnosis not present

## 2018-07-10 HISTORY — PX: MASTECTOMY MODIFIED RADICAL: SHX5962

## 2018-07-10 HISTORY — PX: SIMPLE MASTECTOMY WITH AXILLARY SENTINEL NODE BIOPSY: SHX6098

## 2018-07-10 SURGERY — MASTECTOMY, MODIFIED RADICAL
Anesthesia: General | Laterality: Right | Wound class: Clean

## 2018-07-10 MED ORDER — FENTANYL CITRATE (PF) 100 MCG/2ML IJ SOLN
INTRAMUSCULAR | Status: DC | PRN
  Administered 2018-07-10 (×4): 50 ug via INTRAVENOUS

## 2018-07-10 MED ORDER — CEFAZOLIN SODIUM-DEXTROSE 1-4 GM/50ML-% IV SOLN
INTRAVENOUS | Status: DC | PRN
  Administered 2018-07-10: 1 g via INTRAVENOUS

## 2018-07-10 MED ORDER — SUGAMMADEX SODIUM 200 MG/2ML IV SOLN
INTRAVENOUS | Status: DC | PRN
  Administered 2018-07-10: 150 mg via INTRAVENOUS

## 2018-07-10 MED ORDER — ONDANSETRON HCL 4 MG/2ML IJ SOLN
INTRAMUSCULAR | Status: DC | PRN
  Administered 2018-07-10: 4 mg via INTRAVENOUS

## 2018-07-10 MED ORDER — FENTANYL CITRATE (PF) 100 MCG/2ML IJ SOLN
25.0000 ug | INTRAMUSCULAR | Status: DC | PRN
  Administered 2018-07-10 (×2): 25 ug via INTRAVENOUS

## 2018-07-10 MED ORDER — ROCURONIUM BROMIDE 100 MG/10ML IV SOLN
INTRAVENOUS | Status: DC | PRN
  Administered 2018-07-10: 40 mg via INTRAVENOUS
  Administered 2018-07-10: 10 mg via INTRAVENOUS
  Administered 2018-07-10: 30 mg via INTRAVENOUS

## 2018-07-10 MED ORDER — OXYCODONE HCL 5 MG PO TABS: 5.0000 mg | ORAL_TABLET | Freq: Once | ORAL | Status: DC | PRN

## 2018-07-10 MED ORDER — CEFAZOLIN SODIUM-DEXTROSE 2-4 GM/100ML-% IV SOLN
INTRAVENOUS | Status: AC
  Filled 2018-07-10: qty 100

## 2018-07-10 MED ORDER — PHENYLEPHRINE HCL 10 MG/ML IJ SOLN
INTRAMUSCULAR | Status: DC | PRN
  Administered 2018-07-10: 100 ug via INTRAVENOUS

## 2018-07-10 MED ORDER — LIDOCAINE HCL (CARDIAC) PF 100 MG/5ML IV SOSY
PREFILLED_SYRINGE | INTRAVENOUS | Status: DC | PRN
  Administered 2018-07-10: 80 mg via INTRAVENOUS

## 2018-07-10 MED ORDER — FENTANYL CITRATE (PF) 100 MCG/2ML IJ SOLN
INTRAMUSCULAR | Status: AC
  Administered 2018-07-10: 25 ug via INTRAVENOUS
  Filled 2018-07-10: qty 2

## 2018-07-10 MED ORDER — KETOROLAC TROMETHAMINE 30 MG/ML IJ SOLN
INTRAMUSCULAR | Status: DC | PRN
  Administered 2018-07-10: 30 mg via INTRAVENOUS

## 2018-07-10 MED ORDER — OXYCODONE HCL 5 MG/5ML PO SOLN: 5.0000 mg | Freq: Once | ORAL | Status: DC | PRN

## 2018-07-10 MED ORDER — ACETAMINOPHEN 10 MG/ML IV SOLN
INTRAVENOUS | Status: DC | PRN
  Administered 2018-07-10: 1000 mg via INTRAVENOUS

## 2018-07-10 MED ORDER — FENTANYL CITRATE (PF) 100 MCG/2ML IJ SOLN
INTRAMUSCULAR | Status: AC
  Filled 2018-07-10: qty 2

## 2018-07-10 MED ORDER — PROPOFOL 10 MG/ML IV BOLUS
INTRAVENOUS | Status: DC | PRN
  Administered 2018-07-10: 150 mg via INTRAVENOUS

## 2018-07-10 MED ORDER — HYDROCODONE-ACETAMINOPHEN 5-325 MG PO TABS: 1.0000 | ORAL_TABLET | ORAL | 0 refills | Status: DC | PRN

## 2018-07-10 MED ORDER — TECHNETIUM TC 99M SULFUR COLLOID FILTERED
0.7030 | Freq: Once | INTRAVENOUS | Status: AC | PRN
  Administered 2018-07-10: 0.703 via INTRADERMAL

## 2018-07-10 MED ORDER — MIDAZOLAM HCL 2 MG/2ML IJ SOLN
INTRAMUSCULAR | Status: DC | PRN
  Administered 2018-07-10: 2 mg via INTRAVENOUS

## 2018-07-10 MED ORDER — MIDAZOLAM HCL 2 MG/2ML IJ SOLN
INTRAMUSCULAR | Status: AC
  Filled 2018-07-10: qty 2

## 2018-07-10 MED ORDER — FAMOTIDINE 20 MG PO TABS
ORAL_TABLET | ORAL | Status: AC
  Administered 2018-07-10: 20 mg via ORAL
  Filled 2018-07-10: qty 1

## 2018-07-10 MED ORDER — FAMOTIDINE 20 MG PO TABS
20.0000 mg | ORAL_TABLET | Freq: Once | ORAL | Status: AC
  Administered 2018-07-10: 20 mg via ORAL

## 2018-07-10 MED ORDER — PROPOFOL 500 MG/50ML IV EMUL
INTRAVENOUS | Status: AC
  Filled 2018-07-10: qty 50

## 2018-07-10 MED ORDER — ACETAMINOPHEN 10 MG/ML IV SOLN
INTRAVENOUS | Status: AC
  Filled 2018-07-10: qty 100

## 2018-07-10 MED ORDER — KETOROLAC TROMETHAMINE 30 MG/ML IJ SOLN
INTRAMUSCULAR | Status: AC
  Filled 2018-07-10: qty 1

## 2018-07-10 MED ORDER — METHYLENE BLUE 0.5 % INJ SOLN
INTRAVENOUS | Status: DC | PRN
  Administered 2018-07-10: 10 mL via SUBMUCOSAL

## 2018-07-10 MED ORDER — SUGAMMADEX SODIUM 200 MG/2ML IV SOLN
INTRAVENOUS | Status: AC
  Filled 2018-07-10: qty 2

## 2018-07-10 MED ORDER — DEXAMETHASONE SODIUM PHOSPHATE 10 MG/ML IJ SOLN
INTRAMUSCULAR | Status: DC | PRN
  Administered 2018-07-10: 5 mg via INTRAVENOUS

## 2018-07-10 MED ORDER — TECHNETIUM TC 99M SULFUR COLLOID FILTERED
0.7470 | Freq: Once | INTRAVENOUS | Status: AC | PRN
  Administered 2018-07-10: 0.747 via INTRADERMAL

## 2018-07-10 MED ORDER — METHYLENE BLUE 0.5 % INJ SOLN
INTRAVENOUS | Status: AC
  Filled 2018-07-10: qty 10

## 2018-07-10 MED ORDER — PROPOFOL 500 MG/50ML IV EMUL
INTRAVENOUS | Status: DC | PRN
  Administered 2018-07-10: 50 ug/kg/min via INTRAVENOUS

## 2018-07-10 MED ORDER — LACTATED RINGERS IV SOLN
INTRAVENOUS | Status: DC
  Administered 2018-07-10 (×3): via INTRAVENOUS

## 2018-07-10 SURGICAL SUPPLY — 51 items
APPLIER CLIP 11 MED OPEN (CLIP)
APPLIER CLIP 13 LRG OPEN (CLIP)
APR CLP LRG 13 20 CLIP (CLIP)
APR CLP MED 11 20 MLT OPN (CLIP)
BLADE PHOTON ILLUMINATED (MISCELLANEOUS) ×2 IMPLANT
BLADE SURG 15 STRL SS SAFETY (BLADE) ×6 IMPLANT
BULB RESERV EVAC DRAIN JP 100C (MISCELLANEOUS) IMPLANT
CANISTER SUCT 1200ML W/VALVE (MISCELLANEOUS) ×4 IMPLANT
CHLORAPREP W/TINT 26ML (MISCELLANEOUS) ×6 IMPLANT
CLIP APPLIE 11 MED OPEN (CLIP) IMPLANT
CLIP APPLIE 13 LRG OPEN (CLIP) IMPLANT
CNTNR SPEC 2.5X3XGRAD LEK (MISCELLANEOUS)
CONT SPEC 4OZ STER OR WHT (MISCELLANEOUS)
CONT SPEC 4OZ STRL OR WHT (MISCELLANEOUS)
CONTAINER SPEC 2.5X3XGRAD LEK (MISCELLANEOUS) ×6 IMPLANT
COVER PROBE ULTRASOUND XLONG L (MISCELLANEOUS) ×2 IMPLANT
DRAIN CHANNEL JP 15F RND 16 (MISCELLANEOUS) IMPLANT
DRAPE LAPAROTOMY TRNSV 106X77 (MISCELLANEOUS) ×4 IMPLANT
DRSG GAUZE FLUFF 36X18 (GAUZE/BANDAGES/DRESSINGS) ×6 IMPLANT
DRSG TELFA 3X8 NADH (GAUZE/BANDAGES/DRESSINGS) ×8 IMPLANT
ELECT CAUTERY BLADE TIP 2.5 (TIP)
ELECT REM PT RETURN 9FT ADLT (ELECTROSURGICAL) ×4
ELECTRODE CAUTERY BLDE TIP 2.5 (TIP) ×2 IMPLANT
ELECTRODE REM PT RTRN 9FT ADLT (ELECTROSURGICAL) ×3 IMPLANT
GLOVE BIO SURGEON STRL SZ7.5 (GLOVE) ×6 IMPLANT
GLOVE INDICATOR 8.0 STRL GRN (GLOVE) ×6 IMPLANT
GOWN STRL REUS W/ TWL LRG LVL3 (GOWN DISPOSABLE) ×6 IMPLANT
GOWN STRL REUS W/TWL LRG LVL3 (GOWN DISPOSABLE) ×8
LABEL OR SOLS (LABEL) ×4 IMPLANT
PACK BASIN MINOR ARMC (MISCELLANEOUS) ×4 IMPLANT
PAD DRESSING TELFA 3X8 NADH (GAUZE/BANDAGES/DRESSINGS) ×2 IMPLANT
PIN SAFETY STRL (MISCELLANEOUS) ×4 IMPLANT
RETRACTOR RING XSMALL (MISCELLANEOUS) ×2 IMPLANT
RTRCTR WOUND ALEXIS 13CM XS SH (MISCELLANEOUS)
SHEARS FOC LG CVD HARMONIC 17C (MISCELLANEOUS) IMPLANT
SLEVE PROBE SENORX GAMMA FIND (MISCELLANEOUS) ×4 IMPLANT
SPONGE LAP 18X18 RF (DISPOSABLE) ×6 IMPLANT
STRIP CLOSURE SKIN 1/2X4 (GAUZE/BANDAGES/DRESSINGS) ×8 IMPLANT
SUT ETHILON 3-0 FS-10 30 BLK (SUTURE) ×4
SUT SILK 2 0 (SUTURE) ×4
SUT SILK 2-0 30XBRD TIE 12 (SUTURE) ×3 IMPLANT
SUT SILK 3 0 (SUTURE) ×4
SUT SILK 3-0 18XBRD TIE 12 (SUTURE) ×3 IMPLANT
SUT VIC AB 2-0 CT1 27 (SUTURE) ×16
SUT VIC AB 2-0 CT1 TAPERPNT 27 (SUTURE) ×12 IMPLANT
SUT VIC AB 3-0 SH 27 (SUTURE) ×4
SUT VIC AB 3-0 SH 27X BRD (SUTURE) ×3 IMPLANT
SUT VICRYL+ 3-0 144IN (SUTURE) ×4 IMPLANT
SUTURE EHLN 3-0 FS-10 30 BLK (SUTURE) ×3 IMPLANT
SWABSTK COMLB BENZOIN TINCTURE (MISCELLANEOUS) ×4 IMPLANT
TAPE TRANSPORE STRL 2 31045 (GAUZE/BANDAGES/DRESSINGS) ×4 IMPLANT

## 2018-07-10 NOTE — Transfer of Care (Signed)
Immediate Anesthesia Transfer of Care Note  Patient: Megan Vega  Procedure(s) Performed: MASTECTOMY MODIFIED RADICAL (Right ) SIMPLE MASTECTOMY (Left )  Patient Location: PACU  Anesthesia Type:General  Level of Consciousness: awake, alert , oriented and patient cooperative  Airway & Oxygen Therapy: Patient Spontanous Breathing  Post-op Assessment: Report given to RN, Post -op Vital signs reviewed and stable and Patient moving all extremities  Post vital signs: Reviewed and stable  Last Vitals:  Vitals Value Taken Time  BP 124/72 07/10/2018  1:38 PM  Temp 36.4 C 07/10/2018  1:38 PM  Pulse 92 07/10/2018  1:41 PM  Resp 13 07/10/2018  1:41 PM  SpO2 96 % 07/10/2018  1:41 PM  Vitals shown include unvalidated device data.  Last Pain:  Vitals:   07/10/18 0831  TempSrc: Temporal  PainSc: 0-No pain         Complications: No apparent anesthesia complications

## 2018-07-10 NOTE — Anesthesia Post-op Follow-up Note (Signed)
Anesthesia QCDR form completed.        

## 2018-07-10 NOTE — Anesthesia Postprocedure Evaluation (Signed)
Anesthesia Post Note  Patient: Megan Vega  Procedure(s) Performed: MASTECTOMY MODIFIED RADICAL (Right ) SIMPLE MASTECTOMY (Left )  Patient location during evaluation: PACU Anesthesia Type: General Level of consciousness: awake and alert Pain management: pain level controlled Vital Signs Assessment: post-procedure vital signs reviewed and stable Respiratory status: spontaneous breathing, nonlabored ventilation and respiratory function stable Cardiovascular status: blood pressure returned to baseline and stable Postop Assessment: no apparent nausea or vomiting Anesthetic complications: no     Last Vitals:  Vitals:   07/10/18 1338 07/10/18 1354  BP: 124/72 122/90  Pulse: 91 90  Resp: (!) 21 (!) 21  Temp: 36.4 C   SpO2: 98% 98%    Last Pain:  Vitals:   07/10/18 1354  TempSrc:   PainSc: 0-No pain                 Durenda Hurt

## 2018-07-10 NOTE — Discharge Instructions (Signed)

## 2018-07-10 NOTE — Anesthesia Preprocedure Evaluation (Addendum)
Anesthesia Evaluation  Patient identified by MRN, date of birth, ID band Patient awake    Reviewed: Allergy & Precautions, H&P , NPO status , Patient's Chart, lab work & pertinent test results  Airway Mallampati: III  TM Distance: >3 FB Neck ROM: full    Dental no notable dental hx. (+) Teeth Intact   Pulmonary neg pulmonary ROS, neg COPD, former smoker,           Cardiovascular (-) angina+ Peripheral Vascular Disease  (-) Past MI, (-) Cardiac Stents and (-) CABG negative cardio ROS  (-) dysrhythmias      Neuro/Psych  Headaches,  Neuromuscular disease (peripheral neuropathy) negative neurological ROS  negative psych ROS   GI/Hepatic negative GI ROS, Neg liver ROS,   Endo/Other  negative endocrine ROS  Renal/GU negative Renal ROS  negative genitourinary   Musculoskeletal   Abdominal   Peds  Hematology negative hematology ROS (+)   Anesthesia Other Findings Past Medical History: No date: Ankle fracture No date: Cancer [facility])     Comment:  Basal Cell Carcinoma, about 2011 chest No date: Cataract No date: Family history of adverse reaction to anesthesia     Comment:  DAUGHTER-N/V No date: Hemorrhoids 2015: History of methicillin resistant staphylococcus aureus (MRSA) No date: Migraines     Comment:  MIGRAINES  Past Surgical History: 10/29/2017: BREAST BIOPSY; Bilateral     Comment:  L breast DCIS, R breast invasive mammary carcinoma of no              special type, ER/PR+, Her2/neu 1+ No date: CESAREAN SECTION 2012: COLONOSCOPY 03/12/2018: IRRIGATION AND DEBRIDEMENT ABSCESS; Right     Comment:  Procedure: IRRIGATION AND DEBRIDEMENT RIGHT AXILLARY               ABSCESS;  Surgeon: Robert Bellow, MD;  Location:               [facility] ORS;  Service: General;  Laterality: Right; No date: MANDIBLE FRACTURE SURGERY 11/28/2017: PORTACATH PLACEMENT; Left     Comment:  Procedure: INSERTION PORT-A-CATH;  Surgeon:  Robert Bellow, MD;  Location: [facility] ORS;  Service: General;                Laterality: Left;     Reproductive/Obstetrics negative OB ROS                             Anesthesia Physical Anesthesia Plan  ASA: II  Anesthesia Plan: General   Post-op Pain Management:    Induction:   PONV Risk Score and Plan:   Airway Management Planned:   Additional Equipment:   Intra-op Plan:   Post-operative Plan:   Informed Consent: I have reviewed the patients History and Physical, chart, labs and discussed the procedure including the risks, benefits and alternatives for the proposed anesthesia with the patient or authorized representative who has indicated his/her understanding and acceptance.   Dental Advisory Given  Plan Discussed with: Anesthesiologist, CRNA and Surgeon  Anesthesia Plan Comments:        Anesthesia Quick Evaluation

## 2018-07-10 NOTE — H&P (Signed)
No change in clinical history or exam.  Tolerated sentinel node injection well.  Plan: Left simple mastectomy with sentinel node biopsy for extensive DCIS, right simple mastectomy with axillary node evaluation, possible axillary dissection.

## 2018-07-10 NOTE — Anesthesia Procedure Notes (Signed)
Procedure Name: Intubation Date/Time: 07/10/2018 10:17 AM Performed by: Lowry Bowl, CRNA Pre-anesthesia Checklist: Patient identified, Emergency Drugs available, Suction available, Patient being monitored and Timeout performed Patient Re-evaluated:Patient Re-evaluated prior to induction Oxygen Delivery Method: Circle system utilized Preoxygenation: Pre-oxygenation with 100% oxygen Induction Type: IV induction Ventilation: Mask ventilation without difficulty Laryngoscope Size: Mac and 3 Grade View: Grade I Tube type: Oral Tube size: 7.0 mm Number of attempts: 1 Airway Equipment and Method: Stylet Placement Confirmation: ETT inserted through vocal cords under direct vision,  positive ETCO2 and breath sounds checked- equal and bilateral Secured at: 22 cm Tube secured with: Tape Dental Injury: Teeth and Oropharynx as per pre-operative assessment

## 2018-07-10 NOTE — Op Note (Signed)
Preoperative diagnosis: 1) stage II carcinoma of the right breast status post neoadjuvant chemotherapy; 2) extensive DCIS of the left breast.  Postoperative diagnosis: Same.  Operative procedure: 1) left simple mastectomy with sentinel node biopsy; 2) right modified radical mastectomy.  Operating Surgeon: Hervey Ard, MD.  Assistant: Arvilla Meres, RNFA  Anesthesia: General endotracheal  Estimated blood loss: 50 cc.  Clinical note: This 62 year old woman is undergoing neoadjuvant chemotherapy for node positive invasive mammary carcinoma of the right breast.  She had shown a significant improvement although not complete resolution of the mass in the right breast but stable nodularity of a biopsy-proven axillary node.  She underwent bilateral sentinel node injection this morning with plans for a left simple mastectomy because of the extensive DCIS and for a right mastectomy with sentinel node biopsy with axillary dissection if nodes were still positive.  Ultrasound examination showed the axillary node visible as it had been in June at the time of her office visit.  Of note, the patient had suffered an abscess in the right axilla during her treatment requiring operative incision and drainage which had healed completely.  The patient received Kefzol prior to the procedure.  SCD stockings for DVT prevention.  Operative note: With the patient under adequate general endotracheal anesthesia a Foley catheter was placed by the nurse due to the anticipated operative time.  The right and left chest neck and axilla was cleansed with ChloraPrep and draped after injecting 5 cc of 0.5% methylene blue bilaterally.  The left breast was approached first.  The ellipse of skin was just within 1 cm of the areola as the patient is likely to undergo delayed reconstruction.  Flaps were then elevated taking care not to disturb the previously placed left subclavian PowerPort.  Extensive dissection was within 2  fingerbreadths of the clavicle superiorly, sternum medially, rectus fascia inferiorly and serratus fascia laterally.  The node seeker device was used to identify a cluster of nodes in the left axilla with multiple blue lymphatics running to them.  These were excised and sent in formalin for routine histology.  The breast was elevated off the underlying pectoralis muscle taking the fascia with the specimen.  Hemostasis was with 3-0 Vicryl ties and electrocautery.  The wound was irrigated with sterile water.  A Blake drain was brought out through the lower medial flap and anchored in place with 3-0 nylon stitch.  The wound was closed with a running 2-0 Vicryl deep dermal suture in 2 segments.  Attention was turned to the right breast.  A similar elliptical incision was used and flaps were extended to the clavicle superiorly, sternum medially, rectus fascia and serratus fascia laterally.  The area of concern on ultrasound was identified as a hard palpable node with a smaller hard palpable node adjacent.  These were sent for frozen section both report is positive for macro metastatic disease.  At the same time palpation in the apex of the axilla showed multiple small firm nodes and while it was possible these were related to the abscess the patient previously experienced it was elected to complete an axillary dissection.  Make use of the harmonic scalpel the axillary vein was exposed and the axillary contents swept inferiorly.  The long thoracic nerve of Bell as well as the thoracodorsal nerve artery and vein bundles were identified and protected.  Both nerves were functioning at the end of the procedure.  The remaining axillary contents were sent in formalin for routine histology.  The breast was elevated  off the underlying pectoralis muscle as noted above and sent fresh to pathology per protocol.  The wound was irrigated, Blake drain brought out through the inferior medial incision and the wound closed in 2  segments with a running 2-0 Vicryl suture.  In several areas a small 4-0 Vicryl subarticular suture was used to improve skin approximation.  Benzoin Steri-Strips followed by Telfa, fluff gauze and a breast binder were applied.  Patient tolerated the procedure well and was taken to recovery room in stable condition.

## 2018-07-10 NOTE — OR Nursing (Signed)
Dr Bary Castilla  In to see patient post op care reviewed again with patient and family.  Patient desires discharge.

## 2018-07-13 ENCOUNTER — Encounter: Payer: Self-pay | Admitting: Surgery

## 2018-07-13 ENCOUNTER — Ambulatory Visit (INDEPENDENT_AMBULATORY_CARE_PROVIDER_SITE_OTHER): Payer: 59 | Admitting: General Surgery

## 2018-07-13 VITALS — Ht 65.0 in

## 2018-07-13 DIAGNOSIS — D0512 Intraductal carcinoma in situ of left breast: Secondary | ICD-10-CM

## 2018-07-13 NOTE — Patient Instructions (Addendum)
Return in three days

## 2018-07-13 NOTE — Progress Notes (Signed)
Patient came in today for a wound check.  The wound is clean, with no signs of infection noted. Follow up as scheduled. Changed dressing and re wrapped patient.

## 2018-07-14 LAB — SURGICAL PATHOLOGY

## 2018-07-14 NOTE — Patient Instructions (Addendum)
Return in one week. The patient is aware to call back for any questions or concerns. 

## 2018-07-16 ENCOUNTER — Encounter: Payer: Self-pay | Admitting: General Surgery

## 2018-07-16 ENCOUNTER — Ambulatory Visit (INDEPENDENT_AMBULATORY_CARE_PROVIDER_SITE_OTHER): Payer: 59 | Admitting: General Surgery

## 2018-07-16 VITALS — BP 124/72 | HR 68 | Resp 12 | Ht 65.0 in | Wt 147.0 lb

## 2018-07-16 DIAGNOSIS — D0512 Intraductal carcinoma in situ of left breast: Secondary | ICD-10-CM

## 2018-07-16 NOTE — Progress Notes (Signed)
Patient ID: Megan Vega, female   DOB: 1955-12-13, 62 y.o.   MRN: [id number]  Chief Complaint  [name]    HPI Megan Vega is a 62 y.o. female  Here today for her post op bilateral mastectomy done on 07/10/2018.  Daughter, [name] is present at visit.  HPI  Past Medical History:  Diagnosis Date  . Ankle fracture   . Cancer [facility])    Basal Cell Carcinoma, about 2011 chest  . Cataract   . Family history of adverse reaction to anesthesia    DAUGHTER-N/V  . Hemorrhoids   . History of methicillin resistant staphylococcus aureus (MRSA) 2015  . Migraines    MIGRAINES    Past Surgical History:  Procedure Laterality Date  . BREAST BIOPSY Bilateral 10/29/2017   L breast DCIS, R breast invasive mammary carcinoma of no special type, ER/PR+, Her2/neu 1+  . CESAREAN SECTION    . COLONOSCOPY  2012  . IRRIGATION AND DEBRIDEMENT ABSCESS Right 03/12/2018   Procedure: IRRIGATION AND DEBRIDEMENT RIGHT AXILLARY ABSCESS;  Surgeon: Robert Bellow, MD;  Location: [facility] ORS;  Service: General;  Laterality: Right;  . MANDIBLE FRACTURE SURGERY    . MASTECTOMY MODIFIED RADICAL Right 07/10/2018   Procedure: MASTECTOMY MODIFIED RADICAL;  Surgeon: Robert Bellow, MD;  Location: [facility];  Service: General;  Laterality: Right;  . PORTACATH PLACEMENT Left 11/28/2017   Procedure: INSERTION PORT-A-CATH;  Surgeon: Robert Bellow, MD;  Location: [facility];  Service: General;  Laterality: Left;  . SIMPLE MASTECTOMY WITH AXILLARY SENTINEL NODE BIOPSY Left 07/10/2018   Procedure: SIMPLE MASTECTOMY;  Surgeon: Robert Bellow, MD;  Location: [facility] OR[facility];  Service: General;  Laterality: Left;    Family History  Problem Relation Age of Onset  . Cancer Mother        Esophageal Cancer  . Early death Mother        Age 57  . Cancer Father        Lung Cancer  . Diabetes Father   . Diabetes Brother        Controlled with diet  . Heart attack Paternal Grandfather     Social  History Social History   Tobacco Use  . Smoking status: Former Smoker    Packs/day: 0.25    Years: 10.00    Pack years: 2.50    Last attempt to quit: 1978    Years since quitting: 41.6  . Smokeless tobacco: Never Used  Substance Use Topics  . Alcohol use: Yes    Alcohol/week: 1.0 standard drinks    Types: 1 Glasses of wine per week    Comment: RARE  . Drug use: No    Allergies  Allergen Reactions  . Peanut-Containing Drug Products Swelling and Other (See Comments)    Only when eating too many peanuts, swelling with lips and tingly face    Current Outpatient Medications  Medication Sig Dispense Refill  . acetaminophen (TYLENOL) 500 MG tablet Take 1,000 mg by mouth every 6 (six) hours as needed for moderate pain or headache.    Marland Kitchen aspirin-acetaminophen-caffeine (EXCEDRIN MIGRAINE) 250-250-65 MG tablet Take 1 tablet by mouth every 6 (six) hours as needed for headache or migraine.    Marland Kitchen b complex vitamins tablet Take 1 tablet by mouth daily.    Marland Kitchen CALCIUM PO Take 1 tablet by mouth daily.    Marland Kitchen gabapentin (NEURONTIN) 100 MG capsule TAKE 1 CAPSULE (100 MG TOTAL) BY MOUTH AT BEDTIME. 90 capsule 1  .  HYDROcodone-acetaminophen (NORCO/VICODIN) 5-325 MG tablet Take 1 tablet by mouth every 4 (four) hours as needed for moderate pain. 20 tablet 0  . lidocaine-prilocaine (EMLA) cream Apply 1 application topically as needed. Apply to both areolas and cover with plastic wrap one hour prior to leaving for surgery. 5 g 0  . Multiple Vitamin (MULTIVITAMIN WITH MINERALS) TABS tablet Take 1 tablet by mouth daily.    . polyethylene glycol (MIRALAX / GLYCOLAX) packet Take 17 g by mouth daily as needed (for constipation.).     Marland Kitchen Probiotic Product (PROBIOTIC PO) Take 1 capsule by mouth daily.    Marland Kitchen tetrahydrozoline 0.05 % ophthalmic solution Place 1 drop into both eyes 3 (three) times daily as needed (for dry/red eyes.).     No current facility-administered medications for this visit.     Facility-Administered Medications Ordered in Other Visits  Medication Dose Route Frequency Provider Last Rate Last Dose  . heparin lock flush 100 unit/mL  500 Units Intracatheter PRN Lequita Asal, MD        Review of Systems Review of Systems  Constitutional: Negative.   Respiratory: Negative.   Cardiovascular: Negative.     Blood pressure 124/72, pulse 68, resp. rate 12, height _0  (1.651 m), weight 147 lb (66.7 kg).  Physical Exam Physical Exam  Constitutional: She is oriented to person, place, and time. She appears well-developed and well-nourished.  Pulmonary/Chest:    Neurological: She is alert and oriented to person, place, and time.  Skin: Skin is warm and dry.    Data Reviewed  JP drain volume sheets reviewed.  Left running 80-100/day, right 50-70/day. Surgical Pathology  CASE: [id number]  PATIENT: Megan Vega  Surgical Pathology Report      SPECIMEN SUBMITTED:  A. Breast, left  B. Sentinel nodes, right  C. Breast, right  D. Sentinel nodes, from left axilla  E. Axillary contents, right   CLINICAL HISTORY:  None provided   PRE-OPERATIVE DIAGNOSIS:  Breast cancer   POST-OPERATIVE DIAGNOSIS:  Same as pre-op      DIAGNOSIS:  A. BREAST, LEFT; SIMPLE MASTECTOMY:  - RESIDUAL HIGH-GRADE DUCTAL CARCINOMA IN SITU, COMEDO TYPE.  Size (Extent) of DCIS: 28 mm, Distance from closest margin: 11 mm  D. SENTINEL LYMPH NODES, LEFT; EXCISION:  - FOUR LYMPH NODE NEGATIVE FOR MALIGNANCY (0/4).  - INCIDENTAL CAPSULAR NEVUS.   C. BREAST, RIGHT; SIMPLE MASTECTOMY:  - RESIDUAL INVASIVE MAMMARY CARCINOMA AND HIGH-GRADE DUCTAL CARCINOMA IN  SITU, COMEDO TYPE.  Tumor Size: 36 mm  Invasive Carcinoma Margins: Uninvolved by invasive carcinoma            Distance from closest margin: 8 mm            Specify closest margin: Deep      DCIS Margins: Uninvolved by DCIS            Distance from closest margin:  Greater than 8 mm  B. SENTINEL LYMPH NODES, RIGHT; EXCISION:  - METASTATIC CARCINOMA INVOLVING TWO LYMPH NODES (2/2).  - CHANGES CONSISTENT WITH PRIOR BIOPSY.  Size of Largest Metastatic Deposit: 22 mm  ypT2 ypN2a   Assessment    Extensive  residual axillary disease post neoadjuvant chemotherapy.  Doing well post bilateral mastectomy.    Plan  She will likely benefit from postmastectomy radiation considering the degree of disease identified in the right axilla post neoadjuvant treatment.  Hopefully, drains will be able to be removed next week.   Patient to return in one week. The patient is  aware to call back for any questions or concerns.   HPI, Physical Exam, Assessment and Plan have been scribed under the direction and in the presence of Hervey Ard, MD.  Gaspar Cola, CMA  I have completed the exam and reviewed the above documentation for accuracy and completeness.  I agree with the above.  Haematologist has been used and any errors in dictation or transcription are unintentional.  Hervey Ard, M.D., F.A.C.S.  Forest Gleason Keefe Zawistowski 07/17/2018, 7:23 AM

## 2018-07-22 ENCOUNTER — Ambulatory Visit (INDEPENDENT_AMBULATORY_CARE_PROVIDER_SITE_OTHER): Payer: 59 | Admitting: General Surgery

## 2018-07-22 ENCOUNTER — Encounter: Payer: Self-pay | Admitting: General Surgery

## 2018-07-22 VITALS — BP 115/76 | HR 92 | Temp 93.4°F | Resp 18 | Ht 65.0 in | Wt 148.6 lb

## 2018-07-22 DIAGNOSIS — C50411 Malignant neoplasm of upper-outer quadrant of right female breast: Secondary | ICD-10-CM

## 2018-07-22 NOTE — Patient Instructions (Signed)
Return in ine week. The patient is aware to call back for any questions or concerns.

## 2018-07-22 NOTE — Progress Notes (Signed)
Patient ID: Megan Vega, female   DOB: 09-Dec-1955, 62 y.o.   MRN: [id number]  Chief Complaint  Patient presents with  . Routine Post Op    HPI Megan Vega is a 62 y.o. female.  Here today for mastectomy modified radical bilateral 07/10/18. She states she is doing well, occasionally using her pain medication. Drain sheet present.  HPI  Past Medical History:  Diagnosis Date  . Ankle fracture   . Cancer [facility])    Basal Cell Carcinoma, about 2011 chest  . Cataract   . Family history of adverse reaction to anesthesia    DAUGHTER-N/V  . Hemorrhoids   . History of methicillin resistant staphylococcus aureus (MRSA) 2015  . Migraines    MIGRAINES    Past Surgical History:  Procedure Laterality Date  . BREAST BIOPSY Bilateral 10/29/2017   L breast DCIS, R breast invasive mammary carcinoma of no special type, ER/PR+, Her2/neu 1+  . CESAREAN SECTION    . COLONOSCOPY  2012  . IRRIGATION AND DEBRIDEMENT ABSCESS Right 03/12/2018   Procedure: IRRIGATION AND DEBRIDEMENT RIGHT AXILLARY ABSCESS;  Surgeon: Robert Bellow, MD;  Location: [facility];  Service: General;  Laterality: Right;  . MANDIBLE FRACTURE SURGERY    . MASTECTOMY MODIFIED RADICAL Right 07/10/2018   Procedure: MASTECTOMY MODIFIED RADICAL;  Surgeon: Robert Bellow, MD;  Location: [facility];  Service: General;  Laterality: Right;  . PORTACATH PLACEMENT Left 11/28/2017   Procedure: INSERTION PORT-A-CATH;  Surgeon: Robert Bellow, MD;  Location: [facility];  Service: General;  Laterality: Left;  . SIMPLE MASTECTOMY WITH AXILLARY SENTINEL NODE BIOPSY Left 07/10/2018   Procedure: SIMPLE MASTECTOMY;  Surgeon: Robert Bellow, MD;  Location: [facility];  Service: General;  Laterality: Left;    Family History  Problem Relation Age of Onset  . Cancer Mother        Esophageal Cancer  . Early death Mother        Age 70  . Cancer Father        Lung Cancer  . Diabetes Father   . Diabetes Brother        Controlled with diet   . Heart attack Paternal Grandfather     Social History Social History   Tobacco Use  . Smoking status: Former Smoker    Packs/day: 0.25    Years: 10.00    Pack years: 2.50    Last attempt to quit: 1978    Years since quitting: 41.7  . Smokeless tobacco: Never Used  Substance Use Topics  . Alcohol use: Yes    Alcohol/week: 1.0 standard drinks    Types: 1 Glasses of wine per week    Comment: RARE  . Drug use: No    Allergies  Allergen Reactions  . Peanut-Containing Drug Products Swelling and Other (See Comments)    Only when eating too many peanuts, swelling with lips and tingly face    Current Outpatient Medications  Medication Sig Dispense Refill  . acetaminophen (TYLENOL) 500 MG tablet Take 1,000 mg by mouth every 6 (six) hours as needed for moderate pain or headache.    Marland Kitchen aspirin-acetaminophen-caffeine (EXCEDRIN MIGRAINE) 250-250-65 MG tablet Take 1 tablet by mouth every 6 (six) hours as needed for headache or migraine.    Marland Kitchen b complex vitamins tablet Take 1 tablet by mouth daily.    Marland Kitchen CALCIUM PO Take 1 tablet by mouth daily.    Marland Kitchen gabapentin (NEURONTIN) 100 MG capsule TAKE 1 CAPSULE (100 MG TOTAL) BY MOUTH  AT BEDTIME. 90 capsule 1  . HYDROcodone-acetaminophen (NORCO/VICODIN) 5-325 MG tablet Take 1 tablet by mouth every 4 (four) hours as needed for moderate pain. 20 tablet 0  . Multiple Vitamin (MULTIVITAMIN WITH MINERALS) TABS tablet Take 1 tablet by mouth daily.    . polyethylene glycol (MIRALAX / GLYCOLAX) packet Take 17 g by mouth daily as needed (for constipation.).     Marland Kitchen Probiotic Product (PROBIOTIC PO) Take 1 capsule by mouth daily.    Marland Kitchen tetrahydrozoline 0.05 % ophthalmic solution Place 1 drop into both eyes 3 (three) times daily as needed (for dry/red eyes.).    Marland Kitchen lidocaine-prilocaine (EMLA) cream Apply 1 application topically as needed. Apply to both areolas and cover with plastic wrap one hour prior to leaving for surgery. (Patient not taking: Reported on 07/22/2018)  5 g 0   No current facility-administered medications for this visit.    Facility-Administered Medications Ordered in Other Visits  Medication Dose Route Frequency Provider Last Rate Last Dose  . heparin lock flush 100 unit/mL  500 Units Intracatheter PRN Lequita Asal, MD        Review of Systems Review of Systems  Constitutional: Negative.   Respiratory: Negative.   Cardiovascular: Negative.     Blood pressure 115/76, pulse 92, temperature (!) 93.4 F (34.1 C), temperature source Temporal, resp. rate 18, height 5' 5"  (1.651 m), weight 148 lb 9.6 oz (67.4 kg), SpO2 96 %.  Physical Exam Physical Exam  Constitutional: She is oriented to person, place, and time. She appears well-developed and well-nourished.  Neurological: She is alert and oriented to person, place, and time.  Skin: Skin is warm and dry.   Drains removed.      Assessment    Doing well status post bilateral mastectomy.      Plan The patient will continue to wear a light compressive wrap daily. Return in one week..The patient is aware to call back for any questions or concerns.   HPI, Physical Exam, Assessment and Plan have been scribed under the direction and in the presence of Hervey Ard, MD.  Gaspar Cola, CMA  I have completed the exam and reviewed the above documentation for accuracy and completeness.  I agree with the above.  Haematologist has been used and any errors in dictation or transcription are unintentional.  Hervey Ard, M.D., F.A.C.S.  Forest Gleason Isami Mehra 07/22/2018, 9:28 PM

## 2018-07-23 ENCOUNTER — Ambulatory Visit: Payer: 59 | Admitting: General Surgery

## 2018-07-27 ENCOUNTER — Encounter: Payer: Self-pay | Admitting: General Surgery

## 2018-07-30 ENCOUNTER — Other Ambulatory Visit: Payer: Self-pay

## 2018-07-30 ENCOUNTER — Encounter: Payer: Self-pay | Admitting: General Surgery

## 2018-07-30 ENCOUNTER — Ambulatory Visit (INDEPENDENT_AMBULATORY_CARE_PROVIDER_SITE_OTHER): Payer: 59 | Admitting: General Surgery

## 2018-07-30 VITALS — BP 122/78 | HR 72 | Temp 97.5°F | Resp 13 | Ht 65.0 in | Wt 148.7 lb

## 2018-07-30 DIAGNOSIS — N6489 Other specified disorders of breast: Secondary | ICD-10-CM

## 2018-07-30 NOTE — Progress Notes (Signed)
Patient ID: Megan Vega, female   DOB: 09-04-1956, 62 y.o.   MRN: [id number]  Chief Complaint  [name]    one week f/u s/p double mastectomy done 07-10-18    HPI Megan Vega is a 62 y.o. female here today to follow-upone week f/u s/p double mastectomy done 07-10-18. Patient states she is doing well, she has returned back to work 07/27/18. HPI  Past Medical History:  Diagnosis Date  . Ankle fracture   . Cancer [facility])    Basal Cell Carcinoma, about 2011 chest  . Cataract   . Family history of adverse reaction to anesthesia    DAUGHTER-N/V  . Hemorrhoids   . History of methicillin resistant staphylococcus aureus (MRSA) 2015  . Migraines    MIGRAINES    Past Surgical History:  Procedure Laterality Date  . BREAST BIOPSY Bilateral 10/29/2017   L breast DCIS, R breast invasive mammary carcinoma of no special type, ER/PR+, Her2/neu 1+  . CESAREAN SECTION    . COLONOSCOPY  2012  . IRRIGATION AND DEBRIDEMENT ABSCESS Right 03/12/2018   Procedure: IRRIGATION AND DEBRIDEMENT RIGHT AXILLARY ABSCESS;  Surgeon: Robert Bellow, MD;  Location: [facility];  Service: General;  Laterality: Right;  . MANDIBLE FRACTURE SURGERY    . MASTECTOMY MODIFIED RADICAL Right 07/10/2018   Procedure: MASTECTOMY MODIFIED RADICAL;  Surgeon: Robert Bellow, MD;  Location: [facility];  Service: General;  Laterality: Right;  . PORTACATH PLACEMENT Left 11/28/2017   Procedure: INSERTION PORT-A-CATH;  Surgeon: Robert Bellow, MD;  Location: [facility];  Service: General;  Laterality: Left;  . SIMPLE MASTECTOMY WITH AXILLARY SENTINEL NODE BIOPSY Left 07/10/2018   Procedure: SIMPLE MASTECTOMY;  Surgeon: Robert Bellow, MD;  Location: [facility];  Service: General;  Laterality: Left;    Family History  Problem Relation Age of Onset  . Cancer Mother        Esophageal Cancer  . Early death Mother        Age 58  . Cancer Father        Lung Cancer  . Diabetes Father   . Diabetes  Brother        Controlled with diet  . Heart attack Paternal Grandfather     Social History Social History   Tobacco Use  . Smoking status: Former Smoker    Packs/day: 0.25    Years: 10.00    Pack years: 2.50    Last attempt to quit: 1978    Years since quitting: 41.7  . Smokeless tobacco: Never Used  Substance Use Topics  . Alcohol use: Yes    Alcohol/week: 1.0 standard drinks    Types: 1 Glasses of wine per week    Comment: RARE  . Drug use: No    Allergies  Allergen Reactions  . Peanut-Containing Drug Products Swelling and Other (See Comments)    Only when eating too many peanuts, swelling with lips and tingly face    Current Outpatient Medications  Medication Sig Dispense Refill  . acetaminophen (TYLENOL) 500 MG tablet Take 1,000 mg by mouth every 6 (six) hours as needed for moderate pain or headache.    Marland Kitchen aspirin-acetaminophen-caffeine (EXCEDRIN MIGRAINE) 250-250-65 MG tablet Take 1 tablet by mouth every 6 (six) hours as needed for headache or migraine.    Marland Kitchen b complex vitamins tablet Take 1 tablet by mouth daily.    Marland Kitchen CALCIUM PO Take 1 tablet by mouth daily.    Marland Kitchen gabapentin (NEURONTIN) 100 MG  capsule TAKE 1 CAPSULE (100 MG TOTAL) BY MOUTH AT BEDTIME. 90 capsule 1  . HYDROcodone-acetaminophen (NORCO/VICODIN) 5-325 MG tablet Take 1 tablet by mouth every 4 (four) hours as needed for moderate pain. 20 tablet 0  . lidocaine-prilocaine (EMLA) cream Apply 1 application topically as needed. Apply to both areolas and cover with plastic wrap one hour prior to leaving for surgery. 5 g 0  . Multiple Vitamin (MULTIVITAMIN WITH MINERALS) TABS tablet Take 1 tablet by mouth daily.    . polyethylene glycol (MIRALAX / GLYCOLAX) packet Take 17 g by mouth daily as needed (for constipation.).     Marland Kitchen Probiotic Product (PROBIOTIC PO) Take 1 capsule by mouth daily.    Marland Kitchen tetrahydrozoline 0.05 % ophthalmic solution Place 1 drop into both eyes 3 (three) times daily as needed (for dry/red eyes.).      No current facility-administered medications for this visit.    Facility-Administered Medications Ordered in Other Visits  Medication Dose Route Frequency Provider Last Rate Last Dose  . heparin lock flush 100 unit/mL  500 Units Intracatheter PRN Lequita Asal, MD        Review of Systems Review of Systems  Constitutional: Negative.   Cardiovascular: Negative.     Blood pressure 122/78, pulse 72, temperature (!) 97.5 F (36.4 C), temperature source Temporal, resp. rate 13, height 5' 5"  (1.651 m), weight 148 lb 11.2 oz (67.4 kg), SpO2 95 %.  Physical Exam Physical Exam  Constitutional: She is oriented to person, place, and time. She appears well-developed and well-nourished.  Eyes: Conjunctivae are normal. No scleral icterus.  Neck: Normal range of motion.  Cardiovascular: Normal rate, regular rhythm and normal heart sounds.  Pulmonary/Chest: Effort normal and breath sounds normal. Right breast exhibits no inverted nipple, no mass, no nipple discharge, no skin change and no tenderness. Left breast exhibits no inverted nipple, no mass, no nipple discharge, no skin change and no tenderness. Breasts are symmetrical.  Lymphadenopathy:    She has no cervical adenopathy.  Neurological: She is alert and oriented to person, place, and time.  Skin: Skin is warm and dry.    Excellent shoulder range of motion.   Data Reviewed Drained 72 cc of  fluid from the left, and 125 cc from the right.  ChloraPrep was utilized.  1 cc of 1% plain Xylocaine.  The patient is a sensate in the lower flap and will not be required in the future.    Assessment    Ring well post mastectomy.    Plan Patient is to return in 1 week. She has been advised to call the office for any questions or concerns.  HPI, Physical Exam, Assessment and Plan have been scribed under the direction and in the presence of Hervey Ard, Md.  Eudelia Bunch R. Bobette Mo, CMA      I have completed the exam and reviewed the  above documentation for accuracy and completeness.  I agree with the above.  Haematologist has been used and any errors in dictation or transcription are unintentional.  Hervey Ard, M.D., F.A.C.S.    Megan Vega 07/31/2018, 2:11 PM

## 2018-07-30 NOTE — Patient Instructions (Addendum)
Patient is to return in 1 week. She has been advised to call the office for any questions or concerns.

## 2018-07-31 DIAGNOSIS — S21009A Unspecified open wound of unspecified breast, initial encounter: Secondary | ICD-10-CM | POA: Insufficient documentation

## 2018-07-31 DIAGNOSIS — N6489 Other specified disorders of breast: Secondary | ICD-10-CM | POA: Insufficient documentation

## 2018-08-06 ENCOUNTER — Ambulatory Visit (INDEPENDENT_AMBULATORY_CARE_PROVIDER_SITE_OTHER): Payer: 59 | Admitting: General Surgery

## 2018-08-06 ENCOUNTER — Encounter: Payer: Self-pay | Admitting: General Surgery

## 2018-08-06 ENCOUNTER — Other Ambulatory Visit: Payer: Self-pay

## 2018-08-06 VITALS — BP 122/86 | HR 82 | Temp 97.0°F | Resp 13 | Ht 65.0 in | Wt 147.2 lb

## 2018-08-06 DIAGNOSIS — N6489 Other specified disorders of breast: Secondary | ICD-10-CM

## 2018-08-06 NOTE — Patient Instructions (Addendum)
Return in two weeks. The patient is aware to call back for any questions or concerns.  

## 2018-08-06 NOTE — Progress Notes (Signed)
Patient ID: Megan Vega, female   DOB: [birth year], 62 y.o.   MRN: [id number]  Chief Complaint  Patient presents with  . Routine Post Op    one week f/u s/p bil mastectomy done 07-10-18     HPI Megan Vega is a 62 y.o. female here today for her post op bilateral mastectomy done on 07/10/2018. Patient states she is doing well.  HPI  Past Medical History:  Diagnosis Date  . Ankle fracture   . Cancer [facility])    Basal Cell Carcinoma, about 2011 chest  . Cataract   . Family history of adverse reaction to anesthesia    DAUGHTER-N/V  . Hemorrhoids   . History of methicillin resistant staphylococcus aureus (MRSA) 2015  . Migraines    MIGRAINES    Past Surgical History:  Procedure Laterality Date  . BREAST BIOPSY Bilateral 10/29/2017   L breast DCIS, R breast invasive mammary carcinoma of no special type, ER/PR+, Her2/neu 1+  . CESAREAN SECTION    . COLONOSCOPY  2012  . IRRIGATION AND DEBRIDEMENT ABSCESS Right 03/12/2018   Procedure: IRRIGATION AND DEBRIDEMENT RIGHT AXILLARY ABSCESS;  Surgeon: Robert Bellow, MD;  Location: [facility] ORS;  Service: General;  Laterality: Right;  . MANDIBLE FRACTURE SURGERY    . MASTECTOMY MODIFIED RADICAL Right 07/10/2018   ypT2 ypN2a ER/PR positive, HER-2/neu negative  Surgeon: Robert Bellow, MD;  Location: [facility] ORS;  Service: General;  Laterality: Right;  . PORTACATH PLACEMENT Left 11/28/2017   Procedure: INSERTION PORT-A-CATH;  Surgeon: Robert Bellow, MD;  Location: [facility] ORS;  Service: General;  Laterality: Left;  . SIMPLE MASTECTOMY WITH AXILLARY SENTINEL NODE BIOPSY Left 07/10/2018   High-grade DCIS SIMPLE MASTECTOMY;  Surgeon: Robert Bellow, MD;  Location: [facility] ORS;  Service: General;  Laterality: Left;    Family History  Problem Relation Age of Onset  . Cancer Mother        Esophageal Cancer  . Early death Mother        Age 20  . Cancer Father        Lung Cancer  . Diabetes Father   . Diabetes Brother        Controlled  with diet  . Heart attack Paternal Grandfather     Social History Social History   Tobacco Use  . Smoking status: Former Smoker    Packs/day: 0.25    Years: 10.00    Pack years: 2.50    Last attempt to quit: 1978    Years since quitting: 41.7  . Smokeless tobacco: Never Used  Substance Use Topics  . Alcohol use: Yes    Alcohol/week: 1.0 standard drinks    Types: 1 Glasses of wine per week    Comment: RARE  . Drug use: No    Allergies  Allergen Reactions  . Peanut-Containing Drug Products Swelling and Other (See Comments)    Only when eating too many peanuts, swelling with lips and tingly face    Current Outpatient Medications  Medication Sig Dispense Refill  . acetaminophen (TYLENOL) 500 MG tablet Take 1,000 mg by mouth every 6 (six) hours as needed for moderate pain or headache.    Marland Kitchen aspirin-acetaminophen-caffeine (EXCEDRIN MIGRAINE) 250-250-65 MG tablet Take 1 tablet by mouth every 6 (six) hours as needed for headache or migraine.    Marland Kitchen b complex vitamins tablet Take 1 tablet by mouth daily.    Marland Kitchen CALCIUM PO Take 1 tablet by mouth daily.    Marland Kitchen gabapentin (NEURONTIN) 100 MG  capsule TAKE 1 CAPSULE (100 MG TOTAL) BY MOUTH AT BEDTIME. 90 capsule 1  . HYDROcodone-acetaminophen (NORCO/VICODIN) 5-325 MG tablet Take 1 tablet by mouth every 4 (four) hours as needed for moderate pain. 20 tablet 0  . lidocaine-prilocaine (EMLA) cream Apply 1 application topically as needed. Apply to both areolas and cover with plastic wrap one hour prior to leaving for surgery. 5 g 0  . Multiple Vitamin (MULTIVITAMIN WITH MINERALS) TABS tablet Take 1 tablet by mouth daily.    . polyethylene glycol (MIRALAX / GLYCOLAX) packet Take 17 g by mouth daily as needed (for constipation.).     Marland Kitchen Probiotic Product (PROBIOTIC PO) Take 1 capsule by mouth daily.    Marland Kitchen tetrahydrozoline 0.05 % ophthalmic solution Place 1 drop into both eyes 3 (three) times daily as needed (for dry/red eyes.).     No current  facility-administered medications for this visit.    Facility-Administered Medications Ordered in Other Visits  Medication Dose Route Frequency Provider Last Rate Last Dose  . heparin lock flush 100 unit/mL  500 Units Intracatheter PRN Lequita Asal, MD        Review of Systems Review of Systems  Constitutional: Negative.   Respiratory: Negative.   Cardiovascular: Negative.     Blood pressure 122/86, pulse 82, temperature (!) 97 F (36.1 C), temperature source Temporal, resp. rate 13, height _0  (1.651 m), weight 147 lb 3.2 oz (66.8 kg), SpO2 97 %.  Physical Exam Physical Exam The left mastectomy site is well-healed without evidence of loculated fluid.  The right mastectomy shows suggestion of about 30-45 cc of fluid.  Aspiration was undertaken after ChloraPrep.  30 cc obtained without incident.     Assessment    Doing well post mastectomy.    Plan  The patient will present to the medical supply house for fitting of breast prostheses.  Prescription provided.  Return in two weeks. The patient is aware to call back for any questions or concerns.  The patient will be a candidate for postmastectomy chest wall radiation based on the finding of 9 of 9 nodes positive post neoadjuvant chemotherapy.  We will make arrangements for radiation oncology assessment after her next visit.  I have completed the exam and reviewed the above documentation for accuracy and completeness.  I agree with the above.  Haematologist has been used and any errors in dictation or transcription are unintentional.  Hervey Ard, M.D., F.A.C.S.   Megan Vega Megan Vega 08/06/2018, 9:27 PM

## 2018-08-19 ENCOUNTER — Ambulatory Visit (INDEPENDENT_AMBULATORY_CARE_PROVIDER_SITE_OTHER): Payer: 59 | Admitting: General Surgery

## 2018-08-19 ENCOUNTER — Encounter: Payer: Self-pay | Admitting: General Surgery

## 2018-08-19 VITALS — BP 120/68 | HR 74 | Resp 12 | Ht 66.0 in | Wt 142.0 lb

## 2018-08-19 DIAGNOSIS — C50411 Malignant neoplasm of upper-outer quadrant of right female breast: Secondary | ICD-10-CM

## 2018-08-19 NOTE — Patient Instructions (Addendum)
Return in two weeks. The patient is aware to call back for any questions or concerns.  

## 2018-08-19 NOTE — Progress Notes (Signed)
Patient ID: Megan Vega, female   DOB: 06-02-1956, 62 y.o.   MRN: [id number]  Chief Complaint  [name]    HPI Megan Vega is a 62 y.o. female here today for her follow up bilateral mastectomy done on 07/10/2018. Patient states she is doing well at this time.  HPI  Past Medical History:  Diagnosis Date  . Ankle fracture   . Cancer [facility])    Basal Cell Carcinoma, about 2011 chest  . Cataract   . Family history of adverse reaction to anesthesia    DAUGHTER-N/V  . Hemorrhoids   . History of methicillin resistant staphylococcus aureus (MRSA) 2015  . Migraines    MIGRAINES    Past Surgical History:  Procedure Laterality Date  . BREAST BIOPSY Bilateral 10/29/2017   L breast DCIS, R breast invasive mammary carcinoma of no special type, ER/PR+, Her2/neu 1+  . CESAREAN SECTION    . COLONOSCOPY  2012  . IRRIGATION AND DEBRIDEMENT ABSCESS Right 03/12/2018   Procedure: IRRIGATION AND DEBRIDEMENT RIGHT AXILLARY ABSCESS;  Surgeon: Megan Bellow, MD;  Location: [facility] OR[facility];  Service: General;  Laterality: Right;  . MANDIBLE FRACTURE SURGERY    . MASTECTOMY MODIFIED RADICAL Right 07/10/2018   ypT2 ypN2a ER/PR positive, HER-2/neu negative  Surgeon: Megan Bellow, MD;  Location: [facility] OR[facility];  Service: General;  Laterality: Right;  . PORTACATH PLACEMENT Left 11/28/2017   Procedure: INSERTION PORT-A-CATH;  Surgeon: Megan Bellow, MD;  Location: [facility] OR[facility];  Service: General;  Laterality: Left;  . SIMPLE MASTECTOMY WITH AXILLARY SENTINEL NODE BIOPSY Left 07/10/2018   High-grade DCIS SIMPLE MASTECTOMY;  Surgeon: Megan Bellow, MD;  Location: [facility] OR[facility];  Service: General;  Laterality: Left;    Family History  Problem Relation Age of Onset  . Cancer Mother        Esophageal Cancer  . Early death Mother        Age 22  . Cancer Father        Lung Cancer  . Diabetes Father   . Diabetes Brother        Controlled with diet  . Heart attack Paternal  Grandfather     Social History Social History   Tobacco Use  . Smoking status: Former Smoker    Packs/day: 0.25    Years: 10.00    Pack years: 2.50    Last attempt to quit: 1978    Years since quitting: 41.7  . Smokeless tobacco: Never Used  Substance Use Topics  . Alcohol use: Yes    Alcohol/week: 1.0 standard drinks    Types: 1 Glasses of wine per week    Comment: RARE  . Drug use: No    Allergies  Allergen Reactions  . Peanut-Containing Drug Products Swelling and Other (See Comments)    Only when eating too many peanuts, swelling with lips and tingly face    Current Outpatient Medications  Medication Sig Dispense Refill  . acetaminophen (TYLENOL) 500 MG tablet Take 1,000 mg by mouth every 6 (six) hours as needed for moderate pain or headache.    Marland Kitchen aspirin-acetaminophen-caffeine (EXCEDRIN MIGRAINE) 250-250-65 MG tablet Take 1 tablet by mouth every 6 (six) hours as needed for headache or migraine.    Marland Kitchen b complex vitamins tablet Take 1 tablet by mouth daily.    Marland Kitchen CALCIUM PO Take 1 tablet by mouth daily.    Marland Kitchen gabapentin (NEURONTIN) 100 MG capsule TAKE 1 CAPSULE (100 MG TOTAL) BY MOUTH AT BEDTIME.  90 capsule 1  . HYDROcodone-acetaminophen (NORCO/VICODIN) 5-325 MG tablet Take 1 tablet by mouth every 4 (four) hours as needed for moderate pain. 20 tablet 0  . lidocaine-prilocaine (EMLA) cream Apply 1 application topically as needed. Apply to both areolas and cover with plastic wrap one hour prior to leaving for surgery. 5 g 0  . Multiple Vitamin (MULTIVITAMIN WITH MINERALS) TABS tablet Take 1 tablet by mouth daily.    . polyethylene glycol (MIRALAX / GLYCOLAX) packet Take 17 g by mouth daily as needed (for constipation.).     Marland Kitchen Probiotic Product (PROBIOTIC PO) Take 1 capsule by mouth daily.    Marland Kitchen tetrahydrozoline 0.05 % ophthalmic solution Place 1 drop into both eyes 3 (three) times daily as needed (for dry/red eyes.).     No current facility-administered medications for this  visit.    Facility-Administered Medications Ordered in Other Visits  Medication Dose Route Frequency Provider Last Rate Last Dose  . heparin lock flush 100 unit/mL  500 Units Intracatheter PRN Lequita Asal, MD        Review of Systems Review of Systems  Constitutional: Negative.   Respiratory: Negative.   Cardiovascular: Negative.     Blood pressure 120/68, pulse 74, resp. rate 12, height 5' 6"  (1.676 m), weight 142 lb (64.4 kg).  Physical Exam Physical Exam  Constitutional: She is oriented to person, place, and time. She appears well-developed and well-nourished.  Pulmonary/Chest:  Right mastectomy site a little patch on redness.     Neurological: She is alert and oriented to person, place, and time.  Skin: Skin is warm and dry.   Upper extremity measurements were obtained at a location 15 cm above as well as 10 and 20 cm below the olecranon process bilaterally.  Left: 31, 24, 18 cm.  (0/4 lymph nodes positive for malignancy status post neoadjuvant chemotherapy.)  Right: 32, 26, 19 cm.  (9/9 lymph nodes positive on axillary dissection status post neoadjuvant treatment.)     Assessment    Doing well post mastectomy with excellent shoulder range of motion.  Faint inferior flap erythema on the right, no indication for antibiotics at this time.  Mild upper extremity asymmetry, will follow at present.  Plan  Patient to return in 2 weeks.  The patient is aware to call back for any questions or concerns.  HPI, Physical Exam, Assessment and Plan have been scribed under the direction and in the presence of Hervey Ard, MD.  Gaspar Cola, CMA  I have completed the exam and reviewed the above documentation for accuracy and completeness.  I agree with the above.  Haematologist has been used and any errors in dictation or transcription are unintentional.  Hervey Ard, M.D., F.A.C.S.   [name] 08/20/2018, 8:08 AM

## 2018-08-24 ENCOUNTER — Other Ambulatory Visit: Payer: Self-pay

## 2018-08-24 ENCOUNTER — Encounter: Payer: Self-pay | Admitting: Hematology and Oncology

## 2018-08-24 ENCOUNTER — Other Ambulatory Visit: Payer: Self-pay | Admitting: Hematology and Oncology

## 2018-08-24 ENCOUNTER — Inpatient Hospital Stay: Payer: 59 | Attending: Hematology and Oncology | Admitting: Hematology and Oncology

## 2018-08-24 VITALS — BP 122/86 | HR 66 | Temp 97.8°F | Resp 18 | Wt 147.4 lb

## 2018-08-24 DIAGNOSIS — Z8614 Personal history of Methicillin resistant Staphylococcus aureus infection: Secondary | ICD-10-CM | POA: Insufficient documentation

## 2018-08-24 DIAGNOSIS — Z79899 Other long term (current) drug therapy: Secondary | ICD-10-CM | POA: Diagnosis not present

## 2018-08-24 DIAGNOSIS — Z9013 Acquired absence of bilateral breasts and nipples: Secondary | ICD-10-CM | POA: Diagnosis not present

## 2018-08-24 DIAGNOSIS — Z801 Family history of malignant neoplasm of trachea, bronchus and lung: Secondary | ICD-10-CM | POA: Diagnosis not present

## 2018-08-24 DIAGNOSIS — C50411 Malignant neoplasm of upper-outer quadrant of right female breast: Secondary | ICD-10-CM

## 2018-08-24 DIAGNOSIS — Z800 Family history of malignant neoplasm of digestive organs: Secondary | ICD-10-CM | POA: Diagnosis not present

## 2018-08-24 DIAGNOSIS — T451X5A Adverse effect of antineoplastic and immunosuppressive drugs, initial encounter: Secondary | ICD-10-CM

## 2018-08-24 DIAGNOSIS — D0512 Intraductal carcinoma in situ of left breast: Secondary | ICD-10-CM | POA: Diagnosis not present

## 2018-08-24 DIAGNOSIS — Z7189 Other specified counseling: Secondary | ICD-10-CM

## 2018-08-24 DIAGNOSIS — G620 Drug-induced polyneuropathy: Secondary | ICD-10-CM | POA: Diagnosis not present

## 2018-08-24 DIAGNOSIS — Z87891 Personal history of nicotine dependence: Secondary | ICD-10-CM | POA: Diagnosis not present

## 2018-08-24 DIAGNOSIS — D72819 Decreased white blood cell count, unspecified: Secondary | ICD-10-CM

## 2018-08-24 DIAGNOSIS — M8580 Other specified disorders of bone density and structure, unspecified site: Secondary | ICD-10-CM | POA: Insufficient documentation

## 2018-08-24 DIAGNOSIS — Z85828 Personal history of other malignant neoplasm of skin: Secondary | ICD-10-CM

## 2018-08-24 MED ORDER — GABAPENTIN 100 MG PO CAPS: 200.0000 mg | ORAL_CAPSULE | Freq: Every day | ORAL | 1 refills | Status: DC

## 2018-08-24 NOTE — Progress Notes (Signed)
[facility] day:  08/24/2018   Chief Complaint: Megan Vega is a 62 y.o. female with stage IIIB right breast cancer and DCIS of the left breast who is seen for assessment following bilateral mastectomy and discussion regarding direction of therapy.  HPI: The patient was last seen in the medical oncology clinic by me on 06/12/2018.  At that time, she had completed neoadjuvant Taxol.  Symptomatically, she had a progressive grade II chemotherapy induced neuropathy.  Exam was stable.  WBC was 2900 (ANC 1700).  Potassium was 4.1.  We discussed plan for surgery.  She underwent left simple mastectomy with sentinel node biopsy and right modified radical mastectomy on 07/10/2018 by Dr. Bary Castilla.  Left breast pathology revealed 2.8 cm residual high grade DCIS, comedo type.  There was atypical lobular hyperplasia.  Four sentinel lymph nodes were negative.  Right breast pathology revealed 3.6 cm residual grade II invasive mammary carcinoma and high grade DCIS.  There was lymphovascular invasion.There was metastatic carcinoma in 2 sentinel lymph nodes.  Right axillary dissection revealed metastatic carcinoma in 7 lymph nodes.  There were a total of 9 lymph nodes with macrometastasis (largest 22 mm).  Pathologic stage was ypT2 ypN2a.  She returned back to work on 07/27/2018.  She was seen by Dr. Bary Castilla on 08/19/2018.  Notes reviewed.  She was doing well post mastectomy with excellent shoulder range of motion.  There was faint inferior flap erythema on the right, no indication for antibiotics at this time.  There was mild upper extremity asymmetry.  She has follow-up in 2 weeks.  During the interim, patient is doing well overall. She notes that she is having persistent neuropathy. She is having more difficulty with fine motor skills. Describes trying to hand someone a napkin from holder. States, "I went to hand it to them and there was nothing there". Patient denies  that she has experienced any B symptoms. She denies any interval infections.   Patient advises that she maintains an adequate appetite. She is eating well. Weight today is 147 lb 6.4 oz (66.9 kg), which compared to her last visit to the clinic, represents a 6 pound decrease.    Patient complains of pain rated 3/10 in the clinic today.   Past Medical History:  Diagnosis Date  . Ankle fracture   . Cancer [facility])    Basal Cell Carcinoma, about 2011 chest  . Cataract   . Family history of adverse reaction to anesthesia    DAUGHTER-N/V  . Hemorrhoids   . History of methicillin resistant staphylococcus aureus (MRSA) 2015  . Migraines    MIGRAINES    Past Surgical History:  Procedure Laterality Date  . BREAST BIOPSY Bilateral 10/29/2017   L breast DCIS, R breast invasive mammary carcinoma of no special type, ER/PR+, Her2/neu 1+  . CESAREAN SECTION    . COLONOSCOPY  2012  . IRRIGATION AND DEBRIDEMENT ABSCESS Right 03/12/2018   Procedure: IRRIGATION AND DEBRIDEMENT RIGHT AXILLARY ABSCESS;  Surgeon: Robert Bellow, MD;  Location: [facility] ORS;  Service: General;  Laterality: Right;  . MANDIBLE FRACTURE SURGERY    . MASTECTOMY MODIFIED RADICAL Right 07/10/2018   ypT2 ypN2a ER/PR positive, HER-2/neu negative  Surgeon: Robert Bellow, MD;  Location: [facility] ORS;  Service: General;  Laterality: Right;  . PORTACATH PLACEMENT Left 11/28/2017   Procedure: INSERTION PORT-A-CATH;  Surgeon: Robert Bellow, MD;  Location: [facility] ORS;  Service: General;  Laterality: Left;  . SIMPLE MASTECTOMY WITH  AXILLARY SENTINEL NODE BIOPSY Left 07/10/2018   High-grade DCIS SIMPLE MASTECTOMY;  Surgeon: Robert Bellow, MD;  Location: [facility] ORS;  Service: General;  Laterality: Left;    Family History  Problem Relation Age of Onset  . Cancer Mother        Esophageal Cancer  . Early death Mother        Age 78  . Cancer Father        Lung Cancer  . Diabetes Father   . Diabetes Brother        Controlled with  diet  . Heart attack Paternal Grandfather     Social History:  reports that she quit smoking about 41 years ago. She has a 2.50 pack-year smoking history. She has never used smokeless tobacco. She reports that she drinks about 1.0 standard drinks of alcohol per week. She reports that she does not use drugs.  Patient is a former smoker for 20 years; only smoked "about 3 cigarettes a day"; stopped 30 years ago. Patient drinks alcohol very infrequently; "about 1 glass a month". Patient currently employed as an Web designer at [employer] She denies any known exposures to radiations or toxins.  She lives in [place].  Her grand daughter is graduating in 03/2018.  She has one daughter Megan Vega).  She is accompanied by her daughter  today.  Allergies:  Allergies  Allergen Reactions  . Peanut-Containing Drug Products Swelling and Other (See Comments)    Only when eating too many peanuts, swelling with lips and tingly face    Current Medications: Current Outpatient Medications  Medication Sig Dispense Refill  . b complex vitamins tablet Take 1 tablet by mouth daily.    Marland Kitchen CALCIUM PO Take 1 tablet by mouth daily.     Marland Kitchen gabapentin (NEURONTIN) 100 MG capsule TAKE 1 CAPSULE (100 MG TOTAL) BY MOUTH AT BEDTIME. 90 capsule 1  . Multiple Vitamin (MULTIVITAMIN WITH MINERALS) TABS tablet Take 1 tablet by mouth daily.    . Probiotic Product (PROBIOTIC PO) Take 1 capsule by mouth daily.    Marland Kitchen tetrahydrozoline 0.05 % ophthalmic solution Place 1 drop into both eyes 3 (three) times daily as needed (for dry/red eyes.).    Marland Kitchen acetaminophen (TYLENOL) 500 MG tablet Take 1,000 mg by mouth every 6 (six) hours as needed for moderate pain or headache.    Marland Kitchen aspirin-acetaminophen-caffeine (EXCEDRIN MIGRAINE) 250-250-65 MG tablet Take 1 tablet by mouth every 6 (six) hours as needed for headache or migraine.    Marland Kitchen HYDROcodone-acetaminophen (NORCO/VICODIN) 5-325 MG tablet Take 1 tablet by mouth every 4  (four) hours as needed for moderate pain. (Patient not taking: Reported on 08/24/2018) 20 tablet 0  . lidocaine-prilocaine (EMLA) cream Apply 1 application topically as needed. Apply to both areolas and cover with plastic wrap one hour prior to leaving for surgery. (Patient not taking: Reported on 08/24/2018) 5 g 0  . polyethylene glycol (MIRALAX / GLYCOLAX) packet Take 17 g by mouth daily as needed (for constipation.).      No current facility-administered medications for this visit.    Facility-Administered Medications Ordered in Other Visits  Medication Dose Route Frequency Provider Last Rate Last Dose  . heparin lock flush 100 unit/mL  500 Units Intracatheter PRN Lequita Asal, MD        Review of Systems  Constitutional: Positive for weight loss (down 6 pounds). Negative for diaphoresis, fever and malaise/fatigue.       "I am feeling good. I am doing  well I think".   HENT: Negative.  Negative for congestion, ear discharge, ear pain, hearing loss, nosebleeds, sinus pain, sore throat and tinnitus.   Eyes: Negative.  Negative for blurred vision, double vision, pain, discharge and redness.  Respiratory: Negative.  Negative for cough, hemoptysis, sputum production and shortness of breath.   Cardiovascular: Negative.  Negative for chest pain, palpitations, orthopnea, leg swelling and PND.  Gastrointestinal: Negative.  Negative for abdominal pain, blood in stool, constipation, diarrhea, heartburn, melena, nausea and vomiting.  Genitourinary: Negative.  Negative for dysuria, frequency, hematuria and urgency.  Musculoskeletal: Negative.  Negative for back pain, falls and joint pain.  Skin: Negative.  Negative for itching and rash.  Neurological: Positive for tingling (increased in hands - dropping things). Negative for dizziness, tremors (essential), speech change and weakness. Headaches: migraines; chronic.  Endo/Heme/Allergies: Negative.   Psychiatric/Behavioral: Negative for memory loss.  The patient is not nervous/anxious and does not have insomnia.   All other systems reviewed and are negative.  Vitals signs: BP 122/86 (BP Location: Left Arm, Patient Position: Sitting)   Pulse 66   Temp 97.8 F (36.6 C) (Tympanic)   Resp 18   Wt 147 lb 6.4 oz (66.9 kg)   BMI 23.79 kg/m   Physical Exam  Constitutional: She is oriented to person, place, and time and well-developed, well-nourished, and in no distress.  HENT:  Head: Normocephalic and atraumatic.  Mouth/Throat: Oropharynx is clear and moist and mucous membranes are normal.  Short strawberry blonde wig.  Eyes: Pupils are equal, round, and reactive to light. EOM are normal. No scleral icterus.  Hazel/green eyes  Neck: Normal range of motion. Neck supple. No tracheal deviation present. No thyromegaly present.  Cardiovascular: Normal rate, regular rhythm, normal heart sounds and intact distal pulses. Exam reveals no gallop and no friction rub.  No murmur heard. Pulmonary/Chest: Effort normal and breath sounds normal. No respiratory distress. She has no wheezes. She has no rales. Right breast exhibits skin change (Breast surgically absent). Left breast exhibits skin change (Breast surgically absent).  Healing well s/p bilateral mastectomy.  Abdominal: Soft. Bowel sounds are normal. She exhibits no distension. There is no tenderness.  Musculoskeletal: Normal range of motion. She exhibits no edema or tenderness.  Lymphadenopathy:    She has no cervical adenopathy.    She has no axillary adenopathy.       Right: No inguinal and no supraclavicular adenopathy present.       Left: No inguinal and no supraclavicular adenopathy present.  Neurological: She is alert and oriented to person, place, and time.  Skin: Skin is warm and dry. No rash noted. No erythema.  Psychiatric: Mood, affect and judgment normal.  Nursing note and vitals reviewed.   Results for orders placed or performed during the hospital encounter of 07/10/18   Surgical pathology  Result Value Ref Range   SURGICAL PATHOLOGY      Surgical Pathology CASE: [id number] PATIENT: Audelia Hives Surgical Pathology Report     SPECIMEN SUBMITTED: A. Breast, left B. Sentinel nodes, right C. Breast, right D. Sentinel nodes, from left axilla E. Axillary contents, right  CLINICAL HISTORY: None provided  PRE-OPERATIVE DIAGNOSIS: Breast cancer  POST-OPERATIVE DIAGNOSIS: Same as pre-op     DIAGNOSIS: A. BREAST, LEFT; SIMPLE MASTECTOMY: - RESIDUAL HIGH-GRADE DUCTAL CARCINOMA IN SITU, COMEDO TYPE. - SEE CANCER SUMMARY BELOW. - ATYPICAL LOBULAR HYPERPLASIA. - CHANGES CONSISTENT WITH NEOADJUVANT THERAPY. - INCIDENTAL SMALL FIBROADENOMA. - PRIOR BIOPSY SITE CHANGE WITH METALLIC CLIP. - UNREMARKABLE NIPPLE  AND SKIN.  B.  SENTINEL LYMPH NODES, RIGHT; EXCISION: - METASTATIC CARCINOMA INVOLVING TWO LYMPH NODES (2/2). - CHANGES CONSISTENT WITH PRIOR BIOPSY.  C.  BREAST, RIGHT; SIMPLE MASTECTOMY: - RESIDUAL INVASIVE MAMMARY CARCINOMA AND HIGH-GRADE DUCTAL CARCINOMA IN SITU, COMEDO TYPE. - S EE CANCER SUMMARY BELOW. - ATYPICAL LOBULAR HYPERPLASIA. - CHANGES CONSISTENT WITH NEOADJUVANT THERAPY. - INCIDENTAL SMALL FIBROADENOMA. - PRIOR BIOPSY SITE CHANGE WITH METALLIC CLIP. - UNREMARKABLE NIPPLE AND SKIN.  D.  SENTINEL LYMPH NODES, LEFT; EXCISION: - FOUR LYMPH NODE NEGATIVE FOR MALIGNANCY (0/4). - INCIDENTAL CAPSULAR NEVUS.  E.  AXILLARY CONTENTS, RIGHT; AXILLARY DISSECTION: - METASTATIC CARCINOMA INVOLVING SEVEN LYMPH NODES (7/7).  CANCER CASE SUMMARY: DUCTAL CARCINOMA IN SITU OF THE BREAST Procedure: Simple mastectomy with sentinel lymph node excision Specimen Laterality: LEFT Size (Extent) of DCIS:  28 mm Histologic Type: Ductal carcinoma in situ Nuclear Grade: Grade 3 (high) Necrosis: Present, central (expansive comedonecrosis) Margins: Uninvolved by DCIS                      Distance from closest margin: 11 mm                       Specify closest margin: Deep Treatment effect: Treatment Effect in the Breast: Probable or definite response to pre surgical therapy in the invasive carcinoma Lymph nodes: Uninvolved by tumor cells Number of Lymph Nodes Examined: 4 Number of Sentinel Nodes Examined: 4 Pathologic Stage Classification (pTNM, AJCC 8th Edition): ypTis(DCIS) ypN0(sn) TNM Descriptors: y - neoadjuvant  Comment LEFT breast: On initial evaluation a 5 cm area of pleomorphic microcalcifications was demonstrated in the left breast. After neoadjuvant therapy residual high-grade DCIS is present within the area of the prior biopsy (2:00) as well as in the adjacent dense fibrous tissue. DCIS is present in 7 blocks of tissue with an estimated size of 28 mm.  CANCER CASE SUMMARY: INVASIVE CARCINOMA OF THE BREAST Procedure: Simple mastectomy with axillary dissection Specimen Laterality: RIGHT Tumor Size: 36 mm Histologic Type: Invasive carcinoma of no special type Histologic Grade (Nottingham Histologic Score)                      Glandular (Acinar)/Tubular Differentiation: 3                      Nuclear Pleomorp hism: 3                      Mitotic Rate: 1                      Overall Grade: 2 Ductal Carcinoma In Situ (DCIS): Present, negative for extensive intraductal component Margins:         Invasive Carcinoma Margins: Uninvolved by invasive carcinoma                      Distance from closest margin: 8 mm                      Specify closest margin: Deep         DCIS Margins: Uninvolved by DCIS                      Distance from closest margin: Greater than 8 mm  Specify closest margin: Deep Regional Lymph Nodes: Involved by tumor cells            Number of Lymph Nodes with Macrometastases: 9            Number of Lymph Nodes with Micrometastases: 0 Number of Lymph Nodes with Isolated Tumor Cells: 0 Size of Largest Metastatic Deposit: 22 mm Extranodal Extension: Present Number  of Lymph Nodes Examined: 9 Number of Sentinel Nodes Examined: 2 Treatment Effect: Treatment Effect in the Breast: Probable or definite response to presurgical    therapy in t he invasive carcinoma Treatment Effect in the Lymph Nodes: Probable or definite response to presurgical therapy and metastatic carcinoma Residual Cancer Burden (RCB) from [facility] calculator (website below):      Primary Tumor Bed           Primary tumor bed: 65 mm x 45 mm           Overall cancer cellularity: 1 %           Percentage of cancer that is in situ: 1 %      Lymph nodes           Number of lymph nodes positive for metastasis: 9           Diameter of largest metastasis: 22 mm       Residual Cancer Burden: 3.371       Residual Cancer Burden Class: RCB-III  [email or web address] Lymphovascular Invasion: Present Pathologic Stage Classification (pTNM, AJCC 8th Edition): ypT2 ypN2a TNM Descriptors: y - neoadjuvant  Breast Biomarker Tests were performed on the pre-neoadjuvant biopsy of the right invasive mammary carcinoma ([id number]) with the following results:      Estrogen Receptor (ER) Status: POSITIVE       Progesterone Receptor (PgR) Status: POSITIVE      HER2 (by immunohistochemistry): NEGATIVE  Comment RIGHT breast: On initial evaluation pleomorphic microcalcifications were identified in the upper-outer quadrant spanning approximately 5 cm with a 3.5 cm mass within the area of calcifications. After neoadjuvant therapy residual invasive carcinoma and DCIS is present. Carcinoma is present in 9 consecutive blocks of the tumor bed corresponding to an estimated size of 36 mm. The largest linear focus of carcinoma measured on a slide is 22 mm.  GROSS DESCRIPTION: A. Labeled: Left breast Received: Fresh and placed into formalin Time in fixative: 11:32 AM on 07/10/2018 Cold ischemic time: Less than 1 hour Total fixation time: 10 hours Type of  mastectomy: Simple mastectomy Laterality: Left Weight of specimen: 509 grams Size of specimen: 19.0 x 15.7 x 3.0 cm Orientation of specimen: The specimen is oriented with a M designating medial and a L designating lateral. Inkin g scheme: Superficial = blue.  Deep/posterior = black. Skin ellipse dimensions  description: 15.7 x 4.7 cm.  The skin surface is tan and grossly unremarkable. Nipple/ areola: The nipple is tan-pink, grossly unremarkable, and 1.2 cm in diameter.  The areola is tan, grossly unremarkable, and 2.5 cm in diameter. Axillary tail: Absent Biopsy site(s): Present with a coil-shaped biopsy clip Number of discrete masses: 1 possible mass lesion is grossly identified Location of possible mass: Approximately 2:00  Size of possible mass: 0.6 x 0.5 x 0.4 cm Description of possible mass: Pale-tan, ill-defined, and spiculated. Embedded within the possible mass is a coil-shaped biopsy clip. Margins: Deep - 2.2 cm, superficial - 3.5 cm, medial - 7.3 cm, lateral -11.5 cm, and overlying skin - 3.3 cm Gross involvement of skin/fascia/muscle by tumor: Grossly  uninvolved Description of remaining breast: Sectioning the remainder displays a 5.8 x 3.5 x 2.5 cm ill-defined area with scattered ar eas of irregular fibrous tissue and possible calcifications.  The possible mass lesion is located within this area.  The irregular fibrous tissue primarily involves the upper inner quadrant with extension into the upper/lower outer quadrants.  This area comes within 1.1 cm of the deep resection margin and 2.0 cm of the superficial resection margin.  The remaining breast parenchyma consists of tan-yellow, lobulated, otherwise grossly unremarkable fibrofatty tissue with a fibrous to adipose ratio of 5:95. No additional abnormalities are grossly identified. Lymph nodes: None grossly identified  Block Summary: 1 - nipple (bisected) 2-3 - entire, trisected possible mass with biopsy clip site in  cassette 2 4 - deep resection margin closest to possible mass (perpendicular) 5-15 - entire area of irregular fibrous tissue and possible calcifications, submitted from medial to lateral (tissue surrounding possible mass lesion in cassettes 8-9) 16 - deep resection margin closest t o irregular fibrous tissue (perpendicular) 17 - grossly normal breast parenchyma, upper outer quadrant 18 - grossly normal breast parenchyma, upper inner quadrant 19 - grossly normal breast parenchyma, lower outer quadrant  20 - grossly normal breast parenchyma, lower inner quadrant  B. Intraoperative Consultation:     Labeled: Right sentinel nodes     Received: Fresh     Specimen: Right sentinel nodes     Pathologic evaluation performed: Frozen section sentinel node     Diagnosis: FSB1: Lymph node #1 (half): positive for metastatic carcinoma. FSB2: Lymph node #2 (half): Positive for metastatic carcinoma.     Communicated to: Dr. Bary Castilla at 12:55 PM on 07/10/2018 by Quay Burow, M.D. Tissue submitted: A representative section of each lymph node is submitted for frozen section as FSB1 and FSB2.  B. Labeled: Right sentinel nodes Received: Fresh for frozen section Tissue fragment(s): 2 Size: Aggregate, 5.0 x 3.2 x 1.3 cm Description: Received fresh are 2 fragments  of tan-yellow adipose tissue.  Palpation displays 2 lymph node candidates measuring 2.3 and 2.0 cm in greatest dimension.  The lymph nodes are arbitrarily designated as #1 and #2 for grossing purposes.  Each lymph node is bisected, and a representative section of each lymph node is submitted for frozen section. The remainder of each lymph node is submitted for routine histology.  Block summary: 1 -lymph node #1, frozen section representative 2 -lymph node #1, remainder for permanents 3 -lymph node #2, frozen section representative 4 -lymph node #2, remainder for permanents  C. Labeled: Right breast Received: Fresh and placed in  formalin Time in fixative: 12:45 PM on 07/10/2018 Cold ischemic time: Less than 1 hour Total fixation time: 70 hours Type of mastectomy: Simple mastectomy Laterality: Right Weight of specimen: 575 grams Size of specimen: 18.5 x 16.5 x 3.3 cm Orientation of specimen: The specimen is oriented with a M designating medial and a L designating l ateral. Inking scheme: Superficial = blue.  Deep/posterior = black. Skin ellipse dimensions  description: 14.1 x 5.5 cm.  The skin is tan and grossly unremarkable. Nipple/ areola: The nipple is tan-pink, grossly unremarkable, and 1.2 cm in diameter.  The areola is tan, grossly unremarkable, and 2.5 cm in diameter. Axillary tail: Absent Biopsy site(s): Present with a ribbon-shaped biopsy clip Number of discrete masses: 1 possible mass lesion is grossly identified Location of possible mass: Approximately 10:00  Size of possible mass: 1.7 x 1.5 x 1.2 cm Description of possible mass: Pale-tan, ill-defined, and spiculated.  Embedded within the possible mass is a ribbon-shaped biopsy clip. Margins: Deep -1.2 cm, superficial -1.5 cm, medial -8.5 cm, lateral -5.7 cm, and overlying skin -2.7 cm Gross involvement of skin/fascia/muscle by tumor: Grossly uninvolved Description of remaining breast: Sectioning the remainder displays a 6.5 x 4.5 x 2.5 cm ill-defined area of irregula r fibrous tissue (suspicious for tumor bed).  The possible mass lesion is located within this area. The irregular fibrous tissue primarily involves the upper outer/inner quadrants.  This area comes within 1.2 cm of the deep resection margin and 1.5 cm of the superficial resection margin.  The remaining breast parenchyma consists of tan-yellow, lobulated, otherwise grossly unremarkable fibrofatty tissue with a fibrous to adipose ratio of 5: 95. No additional abnormalities are grossly identified. Lymph nodes: None grossly identified  Block Summary: 1 -nipple (bisected) 2-7 -  entire, serially sectioned possible mass lesion with biopsy site in cassette 2 8 -superficial resection margin closest to possible mass lesion (perpendicular) 9 -deep resection margin closest possible mass lesion (perpendicular) 10-18 -entire area of irregular fibrous tissue suspicious for tumor bed, submitted from lateral to medial (tissue surrounding possible mass lesion in cassettes 10-11) 19 - deep resection margin closest to irregular fibrous tissue (perpendicular) 20 -grossly normal breast parenchyma, upper outer quadrant 21 -grossly normal breast parenchyma, upper inner quadrant 22 -grossly normal breast parenchyma, lower outer quadrant 23 -grossly normal breast parenchyma, lower inner quadrant  D. Labeled: Sentinel lymph nodes left axilla Received: Formalin Tissue fragment(s): 1 Size: 4.2 x 3.2 x 1.5 cm Description: Irregular fragment of tan-yellow adipose tissue.  Palpation displays 4 lymph node candidates ranging from 0.6 to 2.1 cm in greatest dimension. The lymph nodes are submitted entirely as follows: 1-2 - one, serially sectioned lymph node 3-4 - one, serially sectioned lymph node 5 - two, bisected lymph nodes (1 differentially inked blue... [VALUE WAS TRUNCATED]    Imaging studies: 10/23/2017:  Bilateral diagnostic mammogram and ultrasound revealed a 3.9 x 1.7 x 2.2 cm irregular hypoechoic mass with associated areas of vascularity at the 10 o'clock position 3 cm from the nipple and a 1.1 x 0.5 x 0.4 cm area of nodularity (3 approximately 3 mm nodules at 10 o'clock 6 cm from the nipple in the right breast. The mass was within a 5 cm area of pleomorphic microcalcifications.  Ultrasound revealed at least 5 prominent lymph nodes with diffuse thickened and hypoechoic cortices suggestive of metastatic disease.  In the left breast, there was an irregular 1.6 x 1.3 x 1.2 cm hypoechoic mass with indistinct margins at 2 o'clock 3 cm from the nipple.  Axillary ultrasound revealed  normal nodes. 11/17/2017:  Bilateral breast MRI revealed a 4.3 x 4.8 x 5 cm mass on the right centered in the upper outer quadrant but also extends into the lower outer and upper inner quadrants.  There was a small amount of enhancement posterior to the right nipple which could represent subtle extension. There was a cluster of nodules located posterior to the superior aspect of the known malignancy and another small nodule located posterior to the inferior aspect of the malignancy which could represent satellite lesions. Taking the enhancement posterior to the right nipple and the potential posterior satellite lesions into account, the AP dimension could be as large as 7 cm.  The patient's known DCIS spans 5 x 5 x 4.4 cm mammographically based on calcifications. The associated enhancement spans 3.2 x 3.2 x 3.3 cm. However, there were 2 small linearly enhancing regions at 6  o'clock in the left breast which could represent further disease.  There was extensive adenopathy in the right axilla and right retropectoral region. There was at least 1 significantly enlarged right internal mammary lymph node. 11/24/2017:  PET scan revealed hypermetabolic right axillary (2.6 x 3.1 cm; SUV 8.0), subpectoral/internal mammary (7 mm; SUV 2.4) adenopathy.  There was a 4 mm right lower lobe nodule is too small for PET resolution.  There was cholelithiasis. 02/18/2018:  Breast ultrasound revealed a 1.0 x 1.36 x 2.14 cm dominant mass in the upper outer quadrant of the right breast (>50% volume decrease).  Axillary lymph nodes showed marked improvement with only 2 of  5 previous identified  lymph nodes identifiable (largest 1.3 cm; smaller node with cortical thickness 0.39 compared to 0.99).   Assessment:  Megan Vega is a 62 y.o. female with clinical stage [id number] right breast cancer and DCIS in the left breast s/p neoadjuvant chemotherapy followed by left simple mastectomy and right modified radical mastectomy.  Left breast  pathology revealed 2.8 cm residual high grade DCIS, comedo type.  There was atypical lobular hyperplasia.  Four sentinel lymph nodes were negative.  Right breast pathology revealed 3.6 cm residual grade II invasive mammary carcinoma and high grade DCIS.  There was lymphovascular invasion.There was metastatic carcinoma in 2 sentinel lymph nodes.  Right axillary dissection revealed metastatic carcinoma in 7 lymph nodes.  There were a total of 9 lymph nodes with macrometastasis (largest 22 mm).  Pathologic stage was ypT2 ypN2a.  She underwent ultrasound guided biopsy on 10/29/2017. Pathology from the right breast at the 10 o'clock position 3 cm from the nipple revealed a 1.4 cm grade II invasive mammary carcinoma of no special type.  Tumor was ER + (> 90%), PR + (90%) and Her2/neu 1+.  Right axillary node biopsy revealed metastatic carcinoma.  Left breast biopsy at the 2 o'clock position 3 cm from the nipple revealed high grade DCIS with comedonecrosis.  CA27.29 was 23.5 on 11/14/2017.  She received 4 cycles of neoadjuvant AC (12/05/2017 - 01/30/2018) with OnPro Neulasta support.  She received 12 weeks of neoadjuvant Taxol (02/13/2018 - 02/27/2018; 04/10/2018 - 06/05/2018).  Week #4 was delayed secondary to a right axillary abscess and neutropenia.   CA27.29 has been followed:  23.5 on 11/14/2017.  Bone density on 12/16/2016 revealed osteopenia with a T-score of -1.6 in the left femoral neck on 12/16/2017.  She developed a right axillary abscess and underwent incision and drainage on 03/12/2018.  Culture grew staph aureus.  She completed Bactrim on 03/26/2018.  Symptomatically, patient is recovering well s/p bilateral mastectomy.  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  She has increasing difficulty with fine motor skills.  Exam reveals well-healing post mastectomy sites bilaterally.  Plan: 1. Labs today:  CBC with diff, CMP, CA27.29. 2. Breast cancer  Doing well  overall.    Patient recovering well from bilateral mastectomy.  Discussed plans for adjuvant radiation therapy.  Follow-up with radiation oncology as already scheduled.  Anticipate reconstruction in 6 months to a year with Dr. Marla Roe  Anticipate endocrine therapy following completion of radiation.    Patient still wishes to be considered for any open clinical trials.  Discuss open clinical studies with research department. 3. Chemotherapy-induced neuropathy  Symptoms seem to be progressive.  Patient noting difficulties with fine motor skills.  B12 and folate levels normal when last checked on 06/12/2018.  Patient on gabapentin 200 mg at bedtime.  Wishing to pursue complementary  treatment modality through acupuncture.  Will have nurse navigator follow-up with patient about scheduling these appointments. 4. Chemotherapy-induced leukopenia  Labs reviewed.  WBC 4500 (ANC 2500, ALC 1300).  Stable. Continue to monitor.  5. Osteopenia  Last bone density was done in 11/2016.  Considering endocrine therapy using an aromatase inhibitor.  Patient aware of increased risk of bone thinning associated with AI therapy.  Schedule bone density for 12/16/2018. 6. Continue port flushes every 6 to 8 weeks. 7. RTC after radiation completion.  Patient will need to call for an appointment.    Honor Loh, NP 08/24/2018, 2:13 PM   I saw and evaluated the patient, participating in the key portions of the service and reviewing pertinent diagnostic studies and records.  I reviewed the nurse practitioner's note and agree with the findings and the plan.  The assessment and plan were discussed with the patient.  Numerous questions were asked by the patient and answered.   Nolon Stalls, MD 08/24/2018,2:13 PM

## 2018-08-24 NOTE — Progress Notes (Signed)
Here for follow up stated she is feeling " good ".   gowned.

## 2018-08-25 ENCOUNTER — Encounter: Payer: Self-pay | Admitting: *Deleted

## 2018-08-26 DIAGNOSIS — C50111 Malignant neoplasm of central portion of right female breast: Secondary | ICD-10-CM | POA: Diagnosis not present

## 2018-08-26 DIAGNOSIS — C50112 Malignant neoplasm of central portion of left female breast: Secondary | ICD-10-CM | POA: Diagnosis not present

## 2018-08-26 DIAGNOSIS — Z9013 Acquired absence of bilateral breasts and nipples: Secondary | ICD-10-CM | POA: Diagnosis not present

## 2018-08-27 ENCOUNTER — Inpatient Hospital Stay: Payer: 59

## 2018-08-27 DIAGNOSIS — C50411 Malignant neoplasm of upper-outer quadrant of right female breast: Secondary | ICD-10-CM

## 2018-08-27 LAB — COMPREHENSIVE METABOLIC PANEL
ALT: 20 U/L (ref 0–44)
AST: 36 U/L (ref 15–41)
Albumin: 4.4 g/dL (ref 3.5–5.0)
Alkaline Phosphatase: 70 U/L (ref 38–126)
Anion gap: 10 (ref 5–15)
BUN: 10 mg/dL (ref 8–23)
CO2: 24 mmol/L (ref 22–32)
Calcium: 9.5 mg/dL (ref 8.9–10.3)
Chloride: 104 mmol/L (ref 98–111)
Creatinine, Ser: 0.76 mg/dL (ref 0.44–1.00)
GFR calc Af Amer: >60 mL/min (ref >60)
GFR calc non Af Amer: >60 mL/min (ref >60)
Glucose, Bld: 101 mg/dL — ABNORMAL HIGH (ref 70–99)
Potassium: 3.9 mmol/L (ref 3.5–5.1)
Sodium: 138 mmol/L (ref 135–145)
Total Bilirubin: 0.6 mg/dL (ref 0.3–1.2)
Total Protein: 7.1 g/dL (ref 6.5–8.1)

## 2018-08-27 LAB — CBC WITH DIFFERENTIAL/PLATELET
Abs Immature Granulocytes: 0.01 10*3/uL (ref 0.00–0.07)
Basophils Absolute: 0 10*3/uL (ref 0.0–0.1)
Basophils Relative: 1 %
Eosinophils Absolute: 0.1 10*3/uL (ref 0.0–0.5)
Eosinophils Relative: 3 %
HCT: 40.7 % (ref 36.0–46.0)
Hemoglobin: 13.4 g/dL (ref 12.0–15.0)
Immature Granulocytes: 0 %
Lymphocytes Relative: 29 %
Lymphs Abs: 1.3 10*3/uL (ref 0.7–4.0)
MCH: 27.9 pg (ref 26.0–34.0)
MCHC: 32.9 g/dL (ref 30.0–36.0)
MCV: 84.8 fL (ref 80.0–100.0)
Monocytes Absolute: 0.5 10*3/uL (ref 0.1–1.0)
Monocytes Relative: 12 %
Neutro Abs: 2.5 10*3/uL (ref 1.7–7.7)
Neutrophils Relative %: 55 %
Platelets: 235 10*3/uL (ref 150–400)
RBC: 4.8 MIL/uL (ref 3.87–5.11)
RDW: 12 % (ref 11.5–15.5)
WBC: 4.5 10*3/uL (ref 4.0–10.5)
nRBC: 0 % (ref 0.0–0.2)

## 2018-08-27 MED ORDER — SODIUM CHLORIDE 0.9% FLUSH
10.0000 mL | Freq: Once | INTRAVENOUS | Status: AC
  Administered 2018-08-27: 10 mL via INTRAVENOUS
  Filled 2018-08-27: qty 10

## 2018-08-27 MED ORDER — HEPARIN SOD (PORK) LOCK FLUSH 100 UNIT/ML IV SOLN
500.0000 [IU] | Freq: Once | INTRAVENOUS | Status: AC
  Administered 2018-08-27: 500 [IU] via INTRAVENOUS
  Filled 2018-08-27: qty 5

## 2018-08-28 LAB — CANCER ANTIGEN 27.29: CA 27.29: <3.5 U/mL (ref 0.0–38.6)

## 2018-09-01 ENCOUNTER — Encounter: Payer: Self-pay | Admitting: General Surgery

## 2018-09-01 ENCOUNTER — Ambulatory Visit (INDEPENDENT_AMBULATORY_CARE_PROVIDER_SITE_OTHER): Payer: 59 | Admitting: General Surgery

## 2018-09-01 ENCOUNTER — Telehealth: Payer: Self-pay

## 2018-09-01 VITALS — BP 122/70 | HR 74 | Resp 13 | Ht 65.0 in | Wt 148.0 lb

## 2018-09-01 DIAGNOSIS — C50411 Malignant neoplasm of upper-outer quadrant of right female breast: Secondary | ICD-10-CM

## 2018-09-01 NOTE — Patient Instructions (Addendum)
Patient to see Dr. Oren Bracket . Patient to return in one month. The patient is aware to call back for any questions or concerns.

## 2018-09-01 NOTE — Progress Notes (Signed)
Patient ID: Megan Vega, female   DOB: June 30, 1956, 61 y.o.   MRN: [id number]  Chief Complaint  [name]    HPI Megan Vega is a 62 y.o. female here today for her her follow up bilateral mastectomy done on 07/10/2018. Patient states she is doing well.  HPI  Past Medical History:  Diagnosis Date  . Ankle fracture   . Cancer [facility])    Basal Cell Carcinoma, about 2011 chest  . Cataract   . Family history of adverse reaction to anesthesia    DAUGHTER-N/V  . Hemorrhoids   . History of methicillin resistant staphylococcus aureus (MRSA) 2015  . Migraines    MIGRAINES    Past Surgical History:  Procedure Laterality Date  . BREAST BIOPSY Bilateral 10/29/2017   L breast DCIS, R breast invasive mammary carcinoma of no special type, ER/PR+, Her2/neu 1+  . CESAREAN SECTION    . COLONOSCOPY  2012  . IRRIGATION AND DEBRIDEMENT ABSCESS Right 03/12/2018   Procedure: IRRIGATION AND DEBRIDEMENT RIGHT AXILLARY ABSCESS;  Surgeon: Robert Bellow, MD;  Location: [facility] ORS;  Service: General;  Laterality: Right;  . MANDIBLE FRACTURE SURGERY    . MASTECTOMY MODIFIED RADICAL Right 07/10/2018   ypT2 ypN2a ER/PR positive, HER-2/neu negative  Surgeon: Robert Bellow, MD;  Location: [facility] ORS;  Service: General;  Laterality: Right;  . PORTACATH PLACEMENT Left 11/28/2017   Procedure: INSERTION PORT-A-CATH;  Surgeon: Robert Bellow, MD;  Location: [facility] ORS;  Service: General;  Laterality: Left;  . SIMPLE MASTECTOMY WITH AXILLARY SENTINEL NODE BIOPSY Left 07/10/2018   High-grade DCIS SIMPLE MASTECTOMY;  Surgeon: Robert Bellow, MD;  Location: [facility] ORS;  Service: General;  Laterality: Left;    Family History  Problem Relation Age of Onset  . Cancer Mother        Esophageal Cancer  . Early death Mother        Age 2  . Cancer Father        Lung Cancer  . Diabetes Father   . Diabetes Brother        Controlled with diet  . Heart attack Paternal Grandfather      Social History Social History   Tobacco Use  . Smoking status: Former Smoker    Packs/day: 0.25    Years: 10.00    Pack years: 2.50    Last attempt to quit: 1978    Years since quitting: 41.8  . Smokeless tobacco: Never Used  Substance Use Topics  . Alcohol use: Yes    Alcohol/week: 1.0 standard drinks    Types: 1 Glasses of wine per week    Comment: RARE  . Drug use: No    Allergies  Allergen Reactions  . Peanut-Containing Drug Products Swelling and Other (See Comments)    Only when eating too many peanuts, swelling with lips and tingly face    Current Outpatient Medications  Medication Sig Dispense Refill  . acetaminophen (TYLENOL) 500 MG tablet Take 1,000 mg by mouth every 6 (six) hours as needed for moderate pain or headache.    Marland Kitchen aspirin-acetaminophen-caffeine (EXCEDRIN MIGRAINE) 250-250-65 MG tablet Take 1 tablet by mouth every 6 (six) hours as needed for headache or migraine.    Marland Kitchen b complex vitamins tablet Take 1 tablet by mouth daily.    Marland Kitchen CALCIUM PO Take 1 tablet by mouth daily.     Marland Kitchen gabapentin (NEURONTIN) 100 MG capsule Take 2 capsules (200 mg total) by mouth at bedtime. [place]  capsule 1  . HYDROcodone-acetaminophen (NORCO/VICODIN) 5-325 MG tablet Take 1 tablet by mouth every 4 (four) hours as needed for moderate pain. 20 tablet 0  . lidocaine-prilocaine (EMLA) cream Apply 1 application topically as needed. Apply to both areolas and cover with plastic wrap one hour prior to leaving for surgery. 5 g 0  . Multiple Vitamin (MULTIVITAMIN WITH MINERALS) TABS tablet Take 1 tablet by mouth daily.    . polyethylene glycol (MIRALAX / GLYCOLAX) packet Take 17 g by mouth daily as needed (for constipation.).     Marland Kitchen Probiotic Product (PROBIOTIC PO) Take 1 capsule by mouth daily.    Marland Kitchen tetrahydrozoline 0.05 % ophthalmic solution Place 1 drop into both eyes 3 (three) times daily as needed (for dry/red eyes.).     No current facility-administered medications for this visit.     Facility-Administered Medications Ordered in Other Visits  Medication Dose Route Frequency Provider Last Rate Last Dose  . heparin lock flush 100 unit/mL  500 Units Intracatheter PRN Lequita Asal, MD        Review of Systems Review of Systems  Constitutional: Negative.   Respiratory: Negative.   Cardiovascular: Negative.     Blood pressure 122/70, pulse 74, resp. rate 13, height _0  (1.651 m), weight 148 lb (67.1 kg).  Physical Exam Physical Exam  Constitutional: She is oriented to person, place, and time. She appears well-developed and well-nourished.  Eyes: Conjunctivae are normal. No scleral icterus.  Neck: Normal range of motion.  Cardiovascular: Normal rate, regular rhythm and normal heart sounds.  Pulmonary/Chest: Effort normal and breath sounds normal.  Mastectomy sites are clean and well healed.   Lymphadenopathy:    She has no cervical adenopathy.  Neurological: She is oriented to person, place, and time.  Skin: Skin is warm and dry.    Data Reviewed Upper extremity measurements were obtained to the location 15 cm above as well as 10 and 20 cm below the olecranon process.  Left: 30, 23, 17 cm.  Right: 31.5, 25.5, 19 cm.  Measurements obtained at the time of her August 19, 2018 exam:  Left: 31, 24, 18 cm.  (0/4 lymph nodes positive for malignancy status post neoadjuvant chemotherapy.)  Right: 32, 26, 19 cm.  (9/9 lymph nodes positive on axillary dissection status post neoadjuvant treatment.)  Pretreatment measurements dated November 17, 2017:  Left: 32, 26, 18.  Right: 33, 26, 19.  Some improvement on the right side post dissection.  Assessment    Doing well post bilateral mastectomy.  Candidate for right side postmastectomy radiation based on the positive nodal status.  Plan   Patient to see Dr. Oren Bracket . Patient to return in one month. The patient is aware to call back for any questions or concerns.   HPI, Physical Exam, Assessment  and Plan have been scribed under the direction and in the presence of Hervey Ard, MD.  Gaspar Cola, CMA  I have completed the exam and reviewed the above documentation for accuracy and completeness.  I agree with the above.  Haematologist has been used and any errors in dictation or transcription are unintentional.  Hervey Ard, M.D., F.A.C.S.  Forest Gleason Rayder Sullenger 09/01/2018, 8:02 PM

## 2018-09-01 NOTE — Telephone Encounter (Signed)
Patient needs 6 week f/u with Dr Bary Castilla, post bilateral mastectomy, surgery done 07/10/18.

## 2018-09-02 NOTE — Telephone Encounter (Signed)
Patient called back and follow up appointment was scheduled for 10-13-18 with Dr. Bary Castilla.

## 2018-09-08 DIAGNOSIS — C50112 Malignant neoplasm of central portion of left female breast: Secondary | ICD-10-CM | POA: Diagnosis not present

## 2018-09-08 DIAGNOSIS — Z9013 Acquired absence of bilateral breasts and nipples: Secondary | ICD-10-CM | POA: Diagnosis not present

## 2018-09-08 DIAGNOSIS — C50111 Malignant neoplasm of central portion of right female breast: Secondary | ICD-10-CM | POA: Diagnosis not present

## 2018-09-09 ENCOUNTER — Other Ambulatory Visit: Payer: Self-pay

## 2018-09-09 ENCOUNTER — Ambulatory Visit
Admission: RE | Admit: 2018-09-09 | Discharge: 2018-09-09 | Disposition: A | Discharge status: Home / Self Care | Payer: 59 | Source: Ambulatory Visit | Attending: Radiation Oncology | Admitting: Radiation Oncology

## 2018-09-09 ENCOUNTER — Encounter: Payer: Self-pay | Admitting: Radiation Oncology

## 2018-09-09 VITALS — BP 119/83 | HR 85 | Temp 98.1°F | Resp 16 | Wt 149.0 lb

## 2018-09-09 DIAGNOSIS — Z170 Estrogen receptor positive status [ER+]: Secondary | ICD-10-CM | POA: Insufficient documentation

## 2018-09-09 DIAGNOSIS — C773 Secondary and unspecified malignant neoplasm of axilla and upper limb lymph nodes: Secondary | ICD-10-CM | POA: Insufficient documentation

## 2018-09-09 DIAGNOSIS — Z9013 Acquired absence of bilateral breasts and nipples: Secondary | ICD-10-CM | POA: Insufficient documentation

## 2018-09-09 DIAGNOSIS — C50411 Malignant neoplasm of upper-outer quadrant of right female breast: Secondary | ICD-10-CM

## 2018-09-09 NOTE — Progress Notes (Signed)
Radiation Oncology Follow up Note  Name: Megan Vega   Date:   09/09/2018 MRN:  [id number] DOB: 1956/01/26    This 62 y.o. female presents to the clinic today for reevaluation for radiation therapy to her right chest wall peripheral lymphatics for stage IIIB invasive mammary carcinoma patient status post bilateral mastectomies.  REFERRING PROVIDER: Burnard Hawthorne, FNP  HPI: patient is a21 year old female originally consult back in July 2019 when she presented with.a 4 cm mass in the 10:00 position 3 cm from the nipple and 5 prominent lymph nodes suspicious for malignancy.Left breast had extensive ductal carcinoma in situ. Right breast biopsy was positive for grade 2 invasive mammary carcinoma ER/PR positive HER-2/neu not overexpressed. There is also metastatic disease in right axillary lymph node. PET CT demonstrated hypermetabolic activity in the right axillary subpectoral internal mammary nodes and patient underwent neoadjuvant chemotherapy with Cytoxan and Adriamycin and Taxol. There was a good clinical response. She then underwent bilateral mastectomies. Right breast had a 2.8 cm area of residual carcinoma nuclear grade 3 high with central expansile comedonecrosis. Margins were clear at 1.1 cm.nteresting 4 sentinel lymph nodes were all negative for malignancy although axillar contents showed 7 out of 7 lymph nodes positive for metastatic disease. She is healing well and is now seen for initial oncology opinion. She specifically denies chest tenderness bone pain cough.  COMPLICATIONS OF TREATMENT: none  FOLLOW UP COMPLIANCE: keeps appointments   PHYSICAL EXAM:  BP 119/83 (BP Location: Left Arm, Patient Position: Sitting)   Pulse 85   Temp 98.1 F (36.7 C) (Tympanic)   Resp 16   Wt 149 lb 0.5 oz (67.6 kg)   BMI 24.80 kg/m  Patient status post bilateral mastectomies incision is healed well. No evidence of lymphedema of her upper extremities is noted. No axillary or supraclavicular  adenopathy is identified.Well-developed well-nourished patient in NAD. HEENT reveals PERLA, EOMI, discs not visualized.  Oral cavity is clear. No oral mucosal lesions are identified. Neck is clear without evidence of cervical or supraclavicular adenopathy. Lungs are clear to A&P. Cardiac examination is essentially unremarkable with regular rate and rhythm without murmur rub or thrill. Abdomen is benign with no organomegaly or masses noted. Motor sensory and DTR levels are equal and symmetric in the upper and lower extremities. Cranial nerves II through XII are grossly intact. Proprioception is intact. No peripheral adenopathy or edema is identified. No motor or sensory levels are noted. Crude visual fields are within normal range.  RADIOLOGY RESULTS: PET CT scan is again reviewed and compatible with the above-stated findings  PLAN: at this time like to go ahead with right chest wall and peripheral lymphatics. Would treat both areas to 5040 cGy in 28 fractions. Would also boost her internal mammary node using photon therapy up to another 2000 cGy using 3-D treatment planning and PET/CT fusion study. Risks and benefits of treatment including skin reaction fatigue alteration of blood counts possible inclusion of superficial lung all were discussed in detail with the patient also emphasized the need to do exercises to try to prevent edema of her right upper extremity. I personally set up and ordered CT simulation for early next week.There will be extra effort by both professional staff as well as technical staff to coordinate and manage concurrent chemoradiation and ensuing side effects during er treatments.patient seems to comprehend my treatment plan well.  I would like to take this opportunity to thank you for allowing me to participate in the care of your patient.Marland Kitchen  Noreene Filbert, MD

## 2018-09-10 ENCOUNTER — Ambulatory Visit (INDEPENDENT_AMBULATORY_CARE_PROVIDER_SITE_OTHER): Payer: 59 | Admitting: Internal Medicine

## 2018-09-10 ENCOUNTER — Encounter: Payer: Self-pay | Admitting: Internal Medicine

## 2018-09-10 VITALS — BP 130/90 | HR 87 | Temp 98.3°F | Resp 15 | Ht 65.0 in | Wt 148.5 lb

## 2018-09-10 DIAGNOSIS — T451X5A Adverse effect of antineoplastic and immunosuppressive drugs, initial encounter: Secondary | ICD-10-CM | POA: Insufficient documentation

## 2018-09-10 DIAGNOSIS — G620 Drug-induced polyneuropathy: Secondary | ICD-10-CM | POA: Insufficient documentation

## 2018-09-10 DIAGNOSIS — L03115 Cellulitis of right lower limb: Secondary | ICD-10-CM

## 2018-09-10 MED ORDER — MUPIROCIN 2 % EX OINT: 1.0000 "application " | TOPICAL_OINTMENT | Freq: Two times a day (BID) | CUTANEOUS | 1 refills | Status: DC

## 2018-09-10 MED ORDER — DOXYCYCLINE HYCLATE 100 MG PO TABS: 100.0000 mg | ORAL_TABLET | Freq: Two times a day (BID) | ORAL | 0 refills | Status: DC

## 2018-09-10 NOTE — Progress Notes (Signed)
Chief Complaint  Patient presents with  . Acute Visit    leg red sore   F/u  C/o left leg blister/boil with redness surrounding painful x 2 days h/o staph last tx'ed 05/2018 with doxycycline but did not get bactroban cream 2/2 cost   H/o breast cancer s/p double mastectomy and chemo and pending radiation    Review of Systems  Constitutional: Negative for fever.  Skin: Positive for rash.   Past Medical History:  Diagnosis Date  . Ankle fracture   . Cancer [facility])    Basal Cell Carcinoma, about 2011 chest  . Cataract   . Family history of adverse reaction to anesthesia    DAUGHTER-N/V  . Hemorrhoids   . History of methicillin resistant staphylococcus aureus (MRSA) 2015  . Migraines    MIGRAINES   Past Surgical History:  Procedure Laterality Date  . BREAST BIOPSY Bilateral 10/29/2017   L breast DCIS, R breast invasive mammary carcinoma of no special type, ER/PR+, Her2/neu 1+  . CESAREAN SECTION    . COLONOSCOPY  2012  . IRRIGATION AND DEBRIDEMENT ABSCESS Right 03/12/2018   Procedure: IRRIGATION AND DEBRIDEMENT RIGHT AXILLARY ABSCESS;  Surgeon: Robert Bellow, MD;  Location: [facility] ORS;  Service: General;  Laterality: Right;  . MANDIBLE FRACTURE SURGERY    . MASTECTOMY MODIFIED RADICAL Right 07/10/2018   ypT2 ypN2a ER/PR positive, HER-2/neu negative  Surgeon: Robert Bellow, MD;  Location: [facility] ORS;  Service: General;  Laterality: Right;  . PORTACATH PLACEMENT Left 11/28/2017   Procedure: INSERTION PORT-A-CATH;  Surgeon: Robert Bellow, MD;  Location: [facility] ORS;  Service: General;  Laterality: Left;  . SIMPLE MASTECTOMY WITH AXILLARY SENTINEL NODE BIOPSY Left 07/10/2018   High-grade DCIS SIMPLE MASTECTOMY;  Surgeon: Robert Bellow, MD;  Location: [facility] ORS;  Service: General;  Laterality: Left;   Family History  Problem Relation Age of Onset  . Cancer Mother        Esophageal Cancer  . Early death Mother        Age 23  . Cancer Father        Lung Cancer  .  Diabetes Father   . Diabetes Brother        Controlled with diet  . Heart attack Paternal Grandfather    Social History   Socioeconomic History  . Marital status: Divorced    Spouse name: Not on file  . Number of children: Not on file  . Years of education: Not on file  . Highest education level: Not on file  Occupational History  . Not on file  Social Needs  . Financial resource strain: Not on file  . Food insecurity:    Worry: Not on file    Inability: Not on file  . Transportation needs:    Medical: Not on file    Non-medical: Not on file  Tobacco Use  . Smoking status: Former Smoker    Packs/day: 0.25    Years: 10.00    Pack years: 2.50    Last attempt to quit: 1978    Years since quitting: 41.8  . Smokeless tobacco: Never Used  Substance and Sexual Activity  . Alcohol use: Yes    Alcohol/week: 1.0 standard drinks    Types: 1 Glasses of wine per week    Comment: RARE  . Drug use: No  . Sexual activity: Not Currently  Lifestyle  . Physical activity:    Days per week: Not on file    Minutes per session:  Not on file  . Stress: Not on file  Relationships  . Social connections:    Talks on phone: Not on file    Gets together: Not on file    Attends religious service: Not on file    Active member of club or organization: Not on file    Attends meetings of clubs or organizations: Not on file    Relationship status: Not on file  . Intimate partner violence:    Fear of current or ex partner: Not on file    Emotionally abused: Not on file    Physically abused: Not on file    Forced sexual activity: Not on file  Other Topics Concern  . 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    Lives at home (son 1 yo lives with her)   Children 2   Pets: 3 small dogs and 1 frog    Caffeine- 1 cup of coffee in the morning, rare tea   Current Meds  Medication Sig  . acetaminophen (TYLENOL) 500 MG tablet Take 1,000 mg by mouth every 6  (six) hours as needed for moderate pain or headache.  Marland Kitchen aspirin-acetaminophen-caffeine (EXCEDRIN MIGRAINE) 250-250-65 MG tablet Take 1 tablet by mouth every 6 (six) hours as needed for headache or migraine.  Marland Kitchen b complex vitamins tablet Take 1 tablet by mouth daily.  Marland Kitchen CALCIUM PO Take 1 tablet by mouth daily.   Marland Kitchen gabapentin (NEURONTIN) 100 MG capsule Take 2 capsules (200 mg total) by mouth at bedtime.  . Multiple Vitamin (MULTIVITAMIN WITH MINERALS) TABS tablet Take 1 tablet by mouth daily.  . polyethylene glycol (MIRALAX / GLYCOLAX) packet Take 17 g by mouth daily as needed (for constipation.).   Marland Kitchen Probiotic Product (PROBIOTIC PO) Take 1 capsule by mouth daily.  Marland Kitchen tetrahydrozoline 0.05 % ophthalmic solution Place 1 drop into both eyes 3 (three) times daily as needed (for dry/red eyes.).   Allergies  Allergen Reactions  . Peanut-Containing Drug Products Swelling and Other (See Comments)    Only when eating too many peanuts, swelling with lips and tingly face   Recent Results (from the past 2160 hour(s))  Surgical pathology     Status: None   Collection Time: 07/10/18 10:54 AM  Result Value Ref Range   SURGICAL PATHOLOGY      Surgical Pathology CASE: [id number] PATIENT: Megan Vega Surgical Pathology Report     SPECIMEN SUBMITTED: A. Breast, left B. Sentinel nodes, right C. Breast, right D. Sentinel nodes, from left axilla E. Axillary contents, right  CLINICAL HISTORY: None provided  PRE-OPERATIVE DIAGNOSIS: Breast cancer  POST-OPERATIVE DIAGNOSIS: Same as pre-op     DIAGNOSIS: A. BREAST, LEFT; SIMPLE MASTECTOMY: - RESIDUAL HIGH-GRADE DUCTAL CARCINOMA IN SITU, COMEDO TYPE. - SEE CANCER SUMMARY BELOW. - ATYPICAL LOBULAR HYPERPLASIA. - CHANGES CONSISTENT WITH NEOADJUVANT THERAPY. - INCIDENTAL SMALL FIBROADENOMA. - PRIOR BIOPSY SITE CHANGE WITH METALLIC CLIP. - UNREMARKABLE NIPPLE AND SKIN.  B.  SENTINEL LYMPH NODES, RIGHT; EXCISION: - METASTATIC CARCINOMA  INVOLVING TWO LYMPH NODES (2/2). - CHANGES CONSISTENT WITH PRIOR BIOPSY.  C.  BREAST, RIGHT; SIMPLE MASTECTOMY: - RESIDUAL INVASIVE MAMMARY CARCINOMA AND HIGH-GRADE DUCTAL CARCINOMA IN SITU, COMEDO TYPE. - S EE CANCER SUMMARY BELOW. - ATYPICAL LOBULAR HYPERPLASIA. - CHANGES CONSISTENT WITH NEOADJUVANT THERAPY. - INCIDENTAL SMALL FIBROADENOMA. - PRIOR BIOPSY SITE CHANGE WITH METALLIC CLIP. - UNREMARKABLE NIPPLE AND SKIN.  D.  SENTINEL LYMPH NODES, LEFT; EXCISION: - FOUR LYMPH NODE NEGATIVE FOR  MALIGNANCY (0/4). - INCIDENTAL CAPSULAR NEVUS.  E.  AXILLARY CONTENTS, RIGHT; AXILLARY DISSECTION: - METASTATIC CARCINOMA INVOLVING SEVEN LYMPH NODES (7/7).  CANCER CASE SUMMARY: DUCTAL CARCINOMA IN SITU OF THE BREAST Procedure: Simple mastectomy with sentinel lymph node excision Specimen Laterality: LEFT Size (Extent) of DCIS:  28 mm Histologic Type: Ductal carcinoma in situ Nuclear Grade: Grade 3 (high) Necrosis: Present, central (expansive comedonecrosis) Margins: Uninvolved by DCIS                      Distance from closest margin: 11 mm                      Specify closest margin: Deep Treatment effect: Treatment Effect in the Breast: Probable or definite response to pre surgical therapy in the invasive carcinoma Lymph nodes: Uninvolved by tumor cells Number of Lymph Nodes Examined: 4 Number of Sentinel Nodes Examined: 4 Pathologic Stage Classification (pTNM, AJCC 8th Edition): ypTis(DCIS) ypN0(sn) TNM Descriptors: y - neoadjuvant  Comment LEFT breast: On initial evaluation a 5 cm area of pleomorphic microcalcifications was demonstrated in the left breast. After neoadjuvant therapy residual high-grade DCIS is present within the area of the prior biopsy (2:00) as well as in the adjacent dense fibrous tissue. DCIS is present in 7 blocks of tissue with an estimated size of 28 mm.  CANCER CASE SUMMARY: INVASIVE CARCINOMA OF THE BREAST Procedure: Simple mastectomy with axillary  dissection Specimen Laterality: RIGHT Tumor Size: 36 mm Histologic Type: Invasive carcinoma of no special type Histologic Grade (Nottingham Histologic Score)                      Glandular (Acinar)/Tubular Differentiation: 3                      Nuclear Pleomorp hism: 3                      Mitotic Rate: 1                      Overall Grade: 2 Ductal Carcinoma In Situ (DCIS): Present, negative for extensive intraductal component Margins:         Invasive Carcinoma Margins: Uninvolved by invasive carcinoma                      Distance from closest margin: 8 mm                      Specify closest margin: Deep         DCIS Margins: Uninvolved by DCIS                      Distance from closest margin: Greater than 8 mm                      Specify closest margin: Deep Regional Lymph Nodes: Involved by tumor cells            Number of Lymph Nodes with Macrometastases: 9            Number of Lymph Nodes with Micrometastases: 0 Number of Lymph Nodes with Isolated Tumor Cells: 0 Size of Largest Metastatic Deposit: 22 mm Extranodal Extension: Present Number of Lymph Nodes Examined: 9 Number of Sentinel Nodes Examined: 2 Treatment Effect: Treatment Effect in the Breast: Probable or definite response to  presurgical    therapy in t he invasive carcinoma Treatment Effect in the Lymph Nodes: Probable or definite response to presurgical therapy and metastatic carcinoma Residual Cancer Burden (RCB) from MD [facility] calculator (website below):      Primary Tumor Bed           Primary tumor bed: 65 mm x 45 mm           Overall cancer cellularity: 1 %           Percentage of cancer that is in situ: 1 %      Lymph nodes           Number of lymph nodes positive for metastasis: 9           Diameter of largest metastasis: 22 mm       Residual Cancer Burden: 3.371       Residual Cancer Burden Class: RCB-III  [email or web address] Lymphovascular  Invasion: Present Pathologic Stage Classification (pTNM, AJCC 8th Edition): ypT2 ypN2a TNM Descriptors: y - neoadjuvant  Breast Biomarker Tests were performed on the pre-neoadjuvant biopsy of the right invasive mammary carcinoma ([id number]) with the following results:      Estrogen Receptor (ER) Status: POSITIVE       Progesterone Receptor (PgR) Status: POSITIVE      HER2 (by immunohistochemistry): NEGATIVE  Comment RIGHT breast: On initial evaluation pleomorphic microcalcifications were identified in the upper-outer quadrant spanning approximately 5 cm with a 3.5 cm mass within the area of calcifications. After neoadjuvant therapy residual invasive carcinoma and DCIS is present. Carcinoma is present in 9 consecutive blocks of the tumor bed corresponding to an estimated size of 36 mm. The largest linear focus of carcinoma measured on a slide is 22 mm.  GROSS DESCRIPTION: A. Labeled: Left breast Received: Fresh and placed into formalin Time in fixative: 11:32 AM on 07/10/2018 Cold ischemic time: Less than 1 hour Total fixation time: 10 hours Type of mastectomy: Simple mastectomy Laterality: Left Weight of specimen: 509 grams Size of specimen: 19.0 x 15.7 x 3.0 cm Orientation of specimen: The specimen is oriented with a M designating medial and a L designating lateral. Inkin g scheme: Superficial = blue.  Deep/posterior = black. Skin ellipse dimensions  description: 15.7 x 4.7 cm.  The skin surface is tan and grossly unremarkable. Nipple/ areola: The nipple is tan-pink, grossly unremarkable, and 1.2 cm in diameter.  The areola is tan, grossly unremarkable, and 2.5 cm in diameter. Axillary tail: Absent Biopsy site(s): Present with a coil-shaped biopsy clip Number of discrete masses: 1 possible mass lesion is grossly identified Location of possible mass: Approximately 2:00  Size of possible mass: 0.6 x 0.5 x 0.4 cm Description of possible mass: Pale-tan, ill-defined, and  spiculated. Embedded within the possible mass is a coil-shaped biopsy clip. Margins: Deep - 2.2 cm, superficial - 3.5 cm, medial - 7.3 cm, lateral -11.5 cm, and overlying skin - 3.3 cm Gross involvement of skin/fascia/muscle by tumor: Grossly uninvolved Description of remaining breast: Sectioning the remainder displays a 5.8 x 3.5 x 2.5 cm ill-defined area with scattered ar eas of irregular fibrous tissue and possible calcifications.  The possible mass lesion is located within this area.  The irregular fibrous tissue primarily involves the upper inner quadrant with extension into the upper/lower outer quadrants.  This area comes within 1.1 cm of the deep resection margin and 2.0 cm of the superficial resection margin.  The remaining breast parenchyma consists of tan-yellow, lobulated, otherwise grossly unremarkable fibrofatty  tissue with a fibrous to adipose ratio of 5:95. No additional abnormalities are grossly identified. Lymph nodes: None grossly identified  Block Summary: 1 - nipple (bisected) 2-3 - entire, trisected possible mass with biopsy clip site in cassette 2 4 - deep resection margin closest to possible mass (perpendicular) 5-15 - entire area of irregular fibrous tissue and possible calcifications, submitted from medial to lateral (tissue surrounding possible mass lesion in cassettes 8-9) 16 - deep resection margin closest t o irregular fibrous tissue (perpendicular) 17 - grossly normal breast parenchyma, upper outer quadrant 18 - grossly normal breast parenchyma, upper inner quadrant 19 - grossly normal breast parenchyma, lower outer quadrant  20 - grossly normal breast parenchyma, lower inner quadrant  B. Intraoperative Consultation:     Labeled: Right sentinel nodes     Received: Fresh     Specimen: Right sentinel nodes     Pathologic evaluation performed: Frozen section sentinel node     Diagnosis: FSB1: Lymph node #1 (half): positive for metastatic carcinoma.  FSB2: Lymph node #2 (half): Positive for metastatic carcinoma.     Communicated to: Dr. Bary Castilla at 12:55 PM on 07/10/2018 by Quay Burow, M.D. Tissue submitted: A representative section of each lymph node is submitted for frozen section as FSB1 and FSB2.  B. Labeled: Right sentinel nodes Received: Fresh for frozen section Tissue fragment(s): 2 Size: Aggregate, 5.0 x 3.2 x 1.3 cm Description: Received fresh are 2 fragments  of tan-yellow adipose tissue.  Palpation displays 2 lymph node candidates measuring 2.3 and 2.0 cm in greatest dimension.  The lymph nodes are arbitrarily designated as #1 and #2 for grossing purposes.  Each lymph node is bisected, and a representative section of each lymph node is submitted for frozen section. The remainder of each lymph node is submitted for routine histology.  Block summary: 1 -lymph node #1, frozen section representative 2 -lymph node #1, remainder for permanents 3 -lymph node #2, frozen section representative 4 -lymph node #2, remainder for permanents  C. Labeled: Right breast Received: Fresh and placed in formalin Time in fixative: 12:45 PM on 07/10/2018 Cold ischemic time: Less than 1 hour Total fixation time: 70 hours Type of mastectomy: Simple mastectomy Laterality: Right Weight of specimen: 575 grams Size of specimen: 18.5 x 16.5 x 3.3 cm Orientation of specimen: The specimen is oriented with a M designating medial and a L designating l ateral. Inking scheme: Superficial = blue.  Deep/posterior = black. Skin ellipse dimensions  description: 14.1 x 5.5 cm.  The skin is tan and grossly unremarkable. Nipple/ areola: The nipple is tan-pink, grossly unremarkable, and 1.2 cm in diameter.  The areola is tan, grossly unremarkable, and 2.5 cm in diameter. Axillary tail: Absent Biopsy site(s): Present with a ribbon-shaped biopsy clip Number of discrete masses: 1 possible mass lesion is grossly identified Location of possible mass:  Approximately 10:00  Size of possible mass: 1.7 x 1.5 x 1.2 cm Description of possible mass: Pale-tan, ill-defined, and spiculated. Embedded within the possible mass is a ribbon-shaped biopsy clip. Margins: Deep -1.2 cm, superficial -1.5 cm, medial -8.5 cm, lateral -5.7 cm, and overlying skin -2.7 cm Gross involvement of skin/fascia/muscle by tumor: Grossly uninvolved Description of remaining breast: Sectioning the remainder displays a 6.5 x 4.5 x 2.5 cm ill-defined area of irregula r fibrous tissue (suspicious for tumor bed).  The possible mass lesion is located within this area. The irregular fibrous tissue primarily involves the upper outer/inner quadrants.  This area comes within 1.2 cm of  the deep resection margin and 1.5 cm of the superficial resection margin.  The remaining breast parenchyma consists of tan-yellow, lobulated, otherwise grossly unremarkable fibrofatty tissue with a fibrous to adipose ratio of 5: 95. No additional abnormalities are grossly identified. Lymph nodes: None grossly identified  Block Summary: 1 -nipple (bisected) 2-7 - entire, serially sectioned possible mass lesion with biopsy site in cassette 2 8 -superficial resection margin closest to possible mass lesion (perpendicular) 9 -deep resection margin closest possible mass lesion (perpendicular) 10-18 -entire area of irregular fibrous tissue suspicious for tumor bed, submitted from lateral to medial (tissue surrounding possible mass lesion in cassettes 10-11) 19 - deep resection margin closest to irregular fibrous tissue (perpendicular) 20 -grossly normal breast parenchyma, upper outer quadrant 21 -grossly normal breast parenchyma, upper inner quadrant 22 -grossly normal breast parenchyma, lower outer quadrant 23 -grossly normal breast parenchyma, lower inner quadrant  D. Labeled: Sentinel lymph nodes left axilla Received: Formalin Tissue fragment(s): 1 Size: 4.2 x 3.2 x 1.5 cm Description:  Irregular fragment of tan-yellow adipose tissue.  Palpation displays 4 lymph node candidates ranging from 0.6 to 2.1 cm in greatest dimension. The lymph nodes are submitted entirely as follows: 1-2 - one, serially sectioned lymph node 3-4 - one, serially sectioned lymph node 5 - two, bisected lymph nodes (1 differentially inked blue... [VALUE WAS TRUNCATED]  Cancer antigen 27.29     Status: None   Collection Time: 08/27/18  3:32 PM  Result Value Ref Range   CA 27.29 <3.5 0.0 - 38.6 U/mL    Comment: (NOTE) Siemens Centaur Immunochemiluminometric Methodology (ICMA) Values obtained with different assay methods or kits cannot be used interchangeably. Results cannot be interpreted as absolute evidence of the presence or absence of malignant disease. Performed At: [facility] [place], [place] [id number] Rush Farmer MD QI:[id number]   Comprehensive metabolic panel     Status: Abnormal   Collection Time: 08/27/18  3:32 PM  Result Value Ref Range   Sodium 138 135 - 145 mmol/L   Potassium 3.9 3.5 - 5.1 mmol/L   Chloride 104 98 - 111 mmol/L   CO2 24 22 - 32 mmol/L   Glucose, Bld 101 (H) 70 - 99 mg/dL   BUN 10 8 - 23 mg/dL   Creatinine, Ser 0.76 0.44 - 1.00 mg/dL   Calcium 9.5 8.9 - 10.3 mg/dL   Total Protein 7.1 6.5 - 8.1 g/dL   Albumin 4.4 3.5 - 5.0 g/dL   AST 36 15 - 41 U/L   ALT 20 0 - 44 U/L   Alkaline Phosphatase 70 38 - 126 U/L   Total Bilirubin 0.6 0.3 - 1.2 mg/dL   GFR calc non Af Amer >60 >60 mL/min   GFR calc Af Amer >60 >60 mL/min    Comment: (NOTE) The eGFR has been calculated using the CKD EPI equation. This calculation has not been validated in all clinical situations. eGFR's persistently <60 mL/min signify possible Chronic Kidney Disease.    Anion gap 10 5 - 15    Comment: Performed at [facility], [address] [place], [place] [address]  CBC with Differential/Platelet     Status: None   Collection Time: 08/27/18  3:32 PM    Result Value Ref Range   WBC 4.5 4.0 - 10.5 K/uL   RBC 4.80 3.87 - 5.11 MIL/uL   Hemoglobin 13.4 12.0 - 15.0 g/dL   HCT 40.7 36.0 - 46.0 %   MCV 84.8 80.0 - 100.0 fL  MCH 27.9 26.0 - 34.0 pg   MCHC 32.9 30.0 - 36.0 g/dL   RDW 12.0 11.5 - 15.5 %   Platelets 235 150 - 400 K/uL   nRBC 0.0 0.0 - 0.2 %   Neutrophils Relative % 55 %   Neutro Abs 2.5 1.7 - 7.7 K/uL   Lymphocytes Relative 29 %   Lymphs Abs 1.3 0.7 - 4.0 K/uL   Monocytes Relative 12 %   Monocytes Absolute 0.5 0.1 - 1.0 K/uL   Eosinophils Relative 3 %   Eosinophils Absolute 0.1 0.0 - 0.5 K/uL   Basophils Relative 1 %   Basophils Absolute 0.0 0.0 - 0.1 K/uL   Immature Granulocytes 0 %   Abs Immature Granulocytes 0.01 0.00 - 0.07 K/uL    Comment: Performed at [facility], [address] [place], [place] [address]   Objective  Body mass index is 24.71 kg/m. Wt Readings from Last 3 Encounters:  09/10/18 148 lb 8 oz (67.4 kg)  09/09/18 149 lb 0.5 oz (67.6 kg)  09/01/18 148 lb (67.1 kg)   Temp Readings from Last 3 Encounters:  09/10/18 98.3 F (36.8 C) (Oral)  09/09/18 98.1 F (36.7 C) (Tympanic)  08/24/18 97.8 F (36.6 C) (Tympanic)   BP Readings from Last 3 Encounters:  09/10/18 130/90  09/09/18 119/83  09/01/18 122/70   Pulse Readings from Last 3 Encounters:  09/10/18 87  09/09/18 85  09/01/18 74    Physical Exam  Constitutional: She is oriented to person, place, and time. Vital signs are normal. She appears well-developed and well-nourished. She is cooperative.  HENT:  Head: Normocephalic and atraumatic.  Mouth/Throat: Oropharynx is clear and moist and mucous membranes are normal.  Cardiovascular: Normal rate, regular rhythm and normal heart sounds.  Pulmonary/Chest: Effort normal and breath sounds normal.  Neurological: She is alert and oriented to person, place, and time. Gait normal.  Skin: Skin is warm, dry and intact. Rash noted. There is erythema.     Psychiatric: She has a normal  mood and affect. Her speech is normal and behavior is normal. Judgment and thought content normal. Cognition and memory are normal.  Nursing note and vitals reviewed.   Assessment   1. Right leg cellulitis Plan  1. Hibiclens, bactroban and doxy x 10 days bid tx nose bid x 5 days   Provider: Dr. Olivia Mackie McLean-Scocuzza-Internal Medicine

## 2018-09-10 NOTE — Patient Instructions (Addendum)
Try Hibiclens soap to your leg as well   Cellulitis, Adult Cellulitis is a skin infection. The infected area is usually red and tender. This condition occurs most often in the arms and lower legs. The infection can travel to the muscles, blood, and underlying tissue and become serious. It is very important to get treated for this condition. What are the causes? Cellulitis is caused by bacteria. The bacteria enter through a break in the skin, such as a cut, burn, insect bite, open sore, or crack. What increases the risk? This condition is more likely to occur in people who:  Have a weak defense system (immune system).  Have open wounds on the skin such as cuts, burns, bites, and scrapes. Bacteria can enter the body through these open wounds.  Are older.  Have diabetes.  Have a type of long-lasting (chronic) liver disease (cirrhosis) or kidney disease.  Use IV drugs.  What are the signs or symptoms? Symptoms of this condition include:  Redness, streaking, or spotting on the skin.  Swollen area of the skin.  Tenderness or pain when an area of the skin is touched.  Warm skin.  Fever.  Chills.  Blisters.  How is this diagnosed? This condition is diagnosed based on a medical history and physical exam. You may also have tests, including:  Blood tests.  Lab tests.  Imaging tests.  How is this treated? Treatment for this condition may include:  Medicines, such as antibiotic medicines or antihistamines.  Supportive care, such as rest and application of cold or warm cloths (cold or warm compresses) to the skin.  Hospital care, if the condition is severe.  The infection usually gets better within 1-2 days of treatment. Follow these instructions at home:  Take over-the-counter and prescription medicines only as told by your health care provider.  If you were prescribed an antibiotic medicine, take it as told by your health care provider. Do not stop taking the  antibiotic even if you start to feel better.  Drink enough fluid to keep your urine clear or pale yellow.  Do not touch or rub the infected area.  Raise (elevate) the infected area above the level of your heart while you are sitting or lying down.  Apply warm or cold compresses to the affected area as told by your health care provider.  Keep all follow-up visits as told by your health care provider. This is important. These visits let your health care provider make sure a more serious infection is not developing. Contact a health care provider if:  You have a fever.  Your symptoms do not improve within 1-2 days of starting treatment.  Your bone or joint underneath the infected area becomes painful after the skin has healed.  Your infection returns in the same area or another area.  You notice a swollen bump in the infected area.  You develop new symptoms.  You have a general ill feeling (malaise) with muscle aches and pains. Get help right away if:  Your symptoms get worse.  You feel very sleepy.  You develop vomiting or diarrhea that persists.  You notice red streaks coming from the infected area.  Your red area gets larger or turns dark in color. This information is not intended to replace advice given to you by your health care provider. Make sure you discuss any questions you have with your health care provider. Document Released: 08/14/2005 Document Revised: 03/14/2016 Document Reviewed: 09/13/2015 Elsevier Interactive Patient Education  [employer].

## 2018-09-15 ENCOUNTER — Ambulatory Visit
Admission: RE | Admit: 2018-09-15 | Discharge: 2018-09-15 | Disposition: A | Discharge status: Home / Self Care | Payer: 59 | Source: Ambulatory Visit | Attending: Radiation Oncology | Admitting: Radiation Oncology

## 2018-09-15 DIAGNOSIS — Z170 Estrogen receptor positive status [ER+]: Secondary | ICD-10-CM | POA: Diagnosis not present

## 2018-09-15 DIAGNOSIS — Z510 Encounter for antineoplastic radiation therapy: Secondary | ICD-10-CM | POA: Diagnosis not present

## 2018-09-15 DIAGNOSIS — C773 Secondary and unspecified malignant neoplasm of axilla and upper limb lymph nodes: Secondary | ICD-10-CM | POA: Diagnosis not present

## 2018-09-15 DIAGNOSIS — C50411 Malignant neoplasm of upper-outer quadrant of right female breast: Secondary | ICD-10-CM | POA: Diagnosis present

## 2018-09-16 DIAGNOSIS — Z170 Estrogen receptor positive status [ER+]: Secondary | ICD-10-CM | POA: Diagnosis not present

## 2018-09-16 DIAGNOSIS — Z510 Encounter for antineoplastic radiation therapy: Secondary | ICD-10-CM | POA: Diagnosis not present

## 2018-09-16 DIAGNOSIS — C50411 Malignant neoplasm of upper-outer quadrant of right female breast: Secondary | ICD-10-CM | POA: Diagnosis not present

## 2018-09-18 ENCOUNTER — Other Ambulatory Visit: Payer: Self-pay | Admitting: *Deleted

## 2018-09-18 DIAGNOSIS — C50411 Malignant neoplasm of upper-outer quadrant of right female breast: Secondary | ICD-10-CM

## 2018-09-22 ENCOUNTER — Ambulatory Visit
Admission: RE | Admit: 2018-09-22 | Discharge: 2018-09-22 | Disposition: A | Discharge status: Home / Self Care | Payer: 59 | Source: Ambulatory Visit | Attending: Radiation Oncology | Admitting: Radiation Oncology

## 2018-09-22 DIAGNOSIS — C50411 Malignant neoplasm of upper-outer quadrant of right female breast: Secondary | ICD-10-CM | POA: Diagnosis not present

## 2018-09-22 DIAGNOSIS — Z510 Encounter for antineoplastic radiation therapy: Secondary | ICD-10-CM | POA: Diagnosis not present

## 2018-09-22 DIAGNOSIS — Z170 Estrogen receptor positive status [ER+]: Secondary | ICD-10-CM | POA: Diagnosis not present

## 2018-09-22 DIAGNOSIS — C773 Secondary and unspecified malignant neoplasm of axilla and upper limb lymph nodes: Secondary | ICD-10-CM | POA: Diagnosis not present

## 2018-09-23 ENCOUNTER — Encounter: Payer: Self-pay | Admitting: *Deleted

## 2018-09-23 ENCOUNTER — Ambulatory Visit
Admission: RE | Admit: 2018-09-23 | Discharge: 2018-09-23 | Disposition: A | Discharge status: Home / Self Care | Payer: 59 | Source: Ambulatory Visit | Attending: Radiation Oncology | Admitting: Radiation Oncology

## 2018-09-23 DIAGNOSIS — Z510 Encounter for antineoplastic radiation therapy: Secondary | ICD-10-CM | POA: Diagnosis not present

## 2018-09-23 DIAGNOSIS — Z170 Estrogen receptor positive status [ER+]: Secondary | ICD-10-CM | POA: Diagnosis not present

## 2018-09-23 DIAGNOSIS — C50411 Malignant neoplasm of upper-outer quadrant of right female breast: Secondary | ICD-10-CM | POA: Diagnosis not present

## 2018-09-24 ENCOUNTER — Ambulatory Visit
Admission: RE | Admit: 2018-09-24 | Discharge: 2018-09-24 | Disposition: A | Discharge status: Home / Self Care | Payer: 59 | Source: Ambulatory Visit | Attending: Radiation Oncology | Admitting: Radiation Oncology

## 2018-09-24 DIAGNOSIS — C50411 Malignant neoplasm of upper-outer quadrant of right female breast: Secondary | ICD-10-CM | POA: Diagnosis not present

## 2018-09-24 DIAGNOSIS — Z510 Encounter for antineoplastic radiation therapy: Secondary | ICD-10-CM | POA: Diagnosis not present

## 2018-09-24 DIAGNOSIS — Z170 Estrogen receptor positive status [ER+]: Secondary | ICD-10-CM | POA: Diagnosis not present

## 2018-09-25 ENCOUNTER — Ambulatory Visit
Admission: RE | Admit: 2018-09-25 | Discharge: 2018-09-25 | Disposition: A | Discharge status: Home / Self Care | Payer: 59 | Source: Ambulatory Visit | Attending: Radiation Oncology | Admitting: Radiation Oncology

## 2018-09-25 DIAGNOSIS — C50411 Malignant neoplasm of upper-outer quadrant of right female breast: Secondary | ICD-10-CM | POA: Diagnosis not present

## 2018-09-25 DIAGNOSIS — Z510 Encounter for antineoplastic radiation therapy: Secondary | ICD-10-CM | POA: Diagnosis not present

## 2018-09-25 DIAGNOSIS — Z170 Estrogen receptor positive status [ER+]: Secondary | ICD-10-CM | POA: Diagnosis not present

## 2018-09-28 ENCOUNTER — Ambulatory Visit
Admission: RE | Admit: 2018-09-28 | Discharge: 2018-09-28 | Disposition: A | Discharge status: Home / Self Care | Payer: 59 | Source: Ambulatory Visit | Attending: Radiation Oncology | Admitting: Radiation Oncology

## 2018-09-28 DIAGNOSIS — Z170 Estrogen receptor positive status [ER+]: Secondary | ICD-10-CM | POA: Diagnosis not present

## 2018-09-28 DIAGNOSIS — C50411 Malignant neoplasm of upper-outer quadrant of right female breast: Secondary | ICD-10-CM | POA: Diagnosis not present

## 2018-09-28 DIAGNOSIS — Z510 Encounter for antineoplastic radiation therapy: Secondary | ICD-10-CM | POA: Diagnosis not present

## 2018-09-29 ENCOUNTER — Ambulatory Visit
Admission: RE | Admit: 2018-09-29 | Discharge: 2018-09-29 | Disposition: A | Discharge status: Home / Self Care | Payer: 59 | Source: Ambulatory Visit | Attending: Radiation Oncology | Admitting: Radiation Oncology

## 2018-09-29 DIAGNOSIS — Z510 Encounter for antineoplastic radiation therapy: Secondary | ICD-10-CM | POA: Diagnosis not present

## 2018-09-29 DIAGNOSIS — Z170 Estrogen receptor positive status [ER+]: Secondary | ICD-10-CM | POA: Diagnosis not present

## 2018-09-29 DIAGNOSIS — C50411 Malignant neoplasm of upper-outer quadrant of right female breast: Secondary | ICD-10-CM | POA: Diagnosis not present

## 2018-09-30 ENCOUNTER — Ambulatory Visit
Admission: RE | Admit: 2018-09-30 | Discharge: 2018-09-30 | Disposition: A | Discharge status: Home / Self Care | Payer: 59 | Source: Ambulatory Visit | Attending: Radiation Oncology | Admitting: Radiation Oncology

## 2018-09-30 DIAGNOSIS — Z510 Encounter for antineoplastic radiation therapy: Secondary | ICD-10-CM | POA: Diagnosis not present

## 2018-09-30 DIAGNOSIS — Z170 Estrogen receptor positive status [ER+]: Secondary | ICD-10-CM | POA: Diagnosis not present

## 2018-09-30 DIAGNOSIS — C50411 Malignant neoplasm of upper-outer quadrant of right female breast: Secondary | ICD-10-CM | POA: Diagnosis not present

## 2018-10-01 ENCOUNTER — Ambulatory Visit: Payer: 59

## 2018-10-02 ENCOUNTER — Ambulatory Visit
Admission: RE | Admit: 2018-10-02 | Discharge: 2018-10-02 | Disposition: A | Discharge status: Home / Self Care | Payer: 59 | Source: Ambulatory Visit | Attending: Radiation Oncology | Admitting: Radiation Oncology

## 2018-10-02 DIAGNOSIS — Z510 Encounter for antineoplastic radiation therapy: Secondary | ICD-10-CM | POA: Diagnosis not present

## 2018-10-05 ENCOUNTER — Ambulatory Visit
Admission: RE | Admit: 2018-10-05 | Discharge: 2018-10-05 | Disposition: A | Discharge status: Home / Self Care | Payer: 59 | Source: Ambulatory Visit | Attending: Radiation Oncology | Admitting: Radiation Oncology

## 2018-10-05 DIAGNOSIS — Z510 Encounter for antineoplastic radiation therapy: Secondary | ICD-10-CM | POA: Diagnosis not present

## 2018-10-05 DIAGNOSIS — Z170 Estrogen receptor positive status [ER+]: Secondary | ICD-10-CM | POA: Diagnosis not present

## 2018-10-05 DIAGNOSIS — Z452 Encounter for adjustment and management of vascular access device: Secondary | ICD-10-CM | POA: Diagnosis not present

## 2018-10-05 DIAGNOSIS — C50411 Malignant neoplasm of upper-outer quadrant of right female breast: Secondary | ICD-10-CM | POA: Diagnosis not present

## 2018-10-06 ENCOUNTER — Ambulatory Visit
Admission: RE | Admit: 2018-10-06 | Discharge: 2018-10-06 | Disposition: A | Discharge status: Home / Self Care | Payer: 59 | Source: Ambulatory Visit | Attending: Radiation Oncology | Admitting: Radiation Oncology

## 2018-10-06 DIAGNOSIS — Z170 Estrogen receptor positive status [ER+]: Secondary | ICD-10-CM | POA: Diagnosis not present

## 2018-10-06 DIAGNOSIS — C50411 Malignant neoplasm of upper-outer quadrant of right female breast: Secondary | ICD-10-CM | POA: Diagnosis not present

## 2018-10-06 DIAGNOSIS — Z510 Encounter for antineoplastic radiation therapy: Secondary | ICD-10-CM | POA: Diagnosis not present

## 2018-10-07 ENCOUNTER — Inpatient Hospital Stay: Payer: 59 | Attending: Radiation Oncology

## 2018-10-07 ENCOUNTER — Ambulatory Visit
Admission: RE | Admit: 2018-10-07 | Discharge: 2018-10-07 | Disposition: A | Discharge status: Home / Self Care | Payer: 59 | Source: Ambulatory Visit | Attending: Radiation Oncology | Admitting: Radiation Oncology

## 2018-10-07 DIAGNOSIS — Z170 Estrogen receptor positive status [ER+]: Secondary | ICD-10-CM | POA: Diagnosis not present

## 2018-10-07 DIAGNOSIS — C50411 Malignant neoplasm of upper-outer quadrant of right female breast: Secondary | ICD-10-CM | POA: Diagnosis not present

## 2018-10-07 DIAGNOSIS — Z510 Encounter for antineoplastic radiation therapy: Secondary | ICD-10-CM | POA: Diagnosis not present

## 2018-10-07 DIAGNOSIS — Z452 Encounter for adjustment and management of vascular access device: Secondary | ICD-10-CM | POA: Diagnosis not present

## 2018-10-07 LAB — CBC
HEMATOCRIT: 41.7 % (ref 36.0–46.0)
HEMOGLOBIN: 13.6 g/dL (ref 12.0–15.0)
MCH: 27.5 pg (ref 26.0–34.0)
MCHC: 32.6 g/dL (ref 30.0–36.0)
MCV: 84.2 fL (ref 80.0–100.0)
Platelets: 205 10*3/uL (ref 150–400)
RBC: 4.95 MIL/uL (ref 3.87–5.11)
RDW: 12.9 % (ref 11.5–15.5)
WBC: 4.2 10*3/uL (ref 4.0–10.5)
nRBC: 0 % (ref 0.0–0.2)

## 2018-10-08 ENCOUNTER — Ambulatory Visit
Admission: RE | Admit: 2018-10-08 | Discharge: 2018-10-08 | Disposition: A | Discharge status: Home / Self Care | Payer: 59 | Source: Ambulatory Visit | Attending: Radiation Oncology | Admitting: Radiation Oncology

## 2018-10-08 ENCOUNTER — Inpatient Hospital Stay: Payer: 59

## 2018-10-08 DIAGNOSIS — Z95828 Presence of other vascular implants and grafts: Secondary | ICD-10-CM

## 2018-10-08 DIAGNOSIS — Z510 Encounter for antineoplastic radiation therapy: Secondary | ICD-10-CM | POA: Diagnosis not present

## 2018-10-08 DIAGNOSIS — Z170 Estrogen receptor positive status [ER+]: Secondary | ICD-10-CM | POA: Diagnosis not present

## 2018-10-08 DIAGNOSIS — C50411 Malignant neoplasm of upper-outer quadrant of right female breast: Secondary | ICD-10-CM | POA: Diagnosis not present

## 2018-10-08 MED ORDER — SODIUM CHLORIDE 0.9% FLUSH
10.0000 mL | Freq: Once | INTRAVENOUS | Status: AC
  Administered 2018-10-08: 10 mL via INTRAVENOUS
  Filled 2018-10-08: qty 10

## 2018-10-08 MED ORDER — HEPARIN SOD (PORK) LOCK FLUSH 100 UNIT/ML IV SOLN
500.0000 [IU] | Freq: Once | INTRAVENOUS | Status: AC
  Administered 2018-10-08: 500 [IU] via INTRAVENOUS
  Filled 2018-10-08: qty 5

## 2018-10-09 ENCOUNTER — Ambulatory Visit
Admission: RE | Admit: 2018-10-09 | Discharge: 2018-10-09 | Disposition: A | Discharge status: Home / Self Care | Payer: 59 | Source: Ambulatory Visit | Attending: Radiation Oncology | Admitting: Radiation Oncology

## 2018-10-09 DIAGNOSIS — Z510 Encounter for antineoplastic radiation therapy: Secondary | ICD-10-CM | POA: Diagnosis not present

## 2018-10-09 DIAGNOSIS — C50411 Malignant neoplasm of upper-outer quadrant of right female breast: Secondary | ICD-10-CM | POA: Diagnosis not present

## 2018-10-09 DIAGNOSIS — Z170 Estrogen receptor positive status [ER+]: Secondary | ICD-10-CM | POA: Diagnosis not present

## 2018-10-12 ENCOUNTER — Ambulatory Visit
Admission: RE | Admit: 2018-10-12 | Discharge: 2018-10-12 | Disposition: A | Discharge status: Home / Self Care | Payer: 59 | Source: Ambulatory Visit | Attending: Radiation Oncology | Admitting: Radiation Oncology

## 2018-10-12 DIAGNOSIS — R7303 Prediabetes: Secondary | ICD-10-CM | POA: Diagnosis not present

## 2018-10-12 DIAGNOSIS — Z23 Encounter for immunization: Secondary | ICD-10-CM | POA: Diagnosis not present

## 2018-10-12 DIAGNOSIS — Z170 Estrogen receptor positive status [ER+]: Secondary | ICD-10-CM | POA: Diagnosis not present

## 2018-10-12 DIAGNOSIS — C50411 Malignant neoplasm of upper-outer quadrant of right female breast: Secondary | ICD-10-CM | POA: Diagnosis not present

## 2018-10-12 DIAGNOSIS — Z510 Encounter for antineoplastic radiation therapy: Secondary | ICD-10-CM | POA: Diagnosis not present

## 2018-10-13 ENCOUNTER — Encounter: Payer: Self-pay | Admitting: General Surgery

## 2018-10-13 ENCOUNTER — Ambulatory Visit
Admission: RE | Admit: 2018-10-13 | Discharge: 2018-10-13 | Disposition: A | Discharge status: Home / Self Care | Payer: 59 | Source: Ambulatory Visit | Attending: Radiation Oncology | Admitting: Radiation Oncology

## 2018-10-13 ENCOUNTER — Other Ambulatory Visit: Payer: Self-pay

## 2018-10-13 ENCOUNTER — Ambulatory Visit (INDEPENDENT_AMBULATORY_CARE_PROVIDER_SITE_OTHER): Payer: 59 | Admitting: General Surgery

## 2018-10-13 VITALS — BP 125/79 | HR 78 | Temp 97.5°F | Resp 18 | Ht 65.0 in | Wt 148.6 lb

## 2018-10-13 DIAGNOSIS — Z170 Estrogen receptor positive status [ER+]: Secondary | ICD-10-CM | POA: Diagnosis not present

## 2018-10-13 DIAGNOSIS — C50411 Malignant neoplasm of upper-outer quadrant of right female breast: Secondary | ICD-10-CM

## 2018-10-13 DIAGNOSIS — Z510 Encounter for antineoplastic radiation therapy: Secondary | ICD-10-CM | POA: Diagnosis not present

## 2018-10-13 NOTE — Patient Instructions (Addendum)
Patient needs to return to the office in February.

## 2018-10-13 NOTE — Progress Notes (Signed)
Patient ID: Megan Vega, female   DOB: 26-May-1956, 62 y.o.   MRN: [id number]  Chief Complaint  Patient presents with  . Routine Post Op     f/u s/p bilateral mastectomy, surgery done 07/10/18    HPI Megan Vega is a 62 y.o. female here today to follow up for s/p bilateral mastectomy, surgery done 07/10/18. Patient states she is feeling well.  The patient is now about 3 weeks into her postmastectomy radiation on the right side where she had multiple positive nodes after neoadjuvant chemotherapy.  Energy level remains excellent. HPI  Past Medical History:  Diagnosis Date  . Ankle fracture   . Cancer [facility])    Basal Cell Carcinoma, about 2011 chest  . Cataract   . Family history of adverse reaction to anesthesia    DAUGHTER-N/V  . Hemorrhoids   . History of methicillin resistant staphylococcus aureus (MRSA) 2015  . Migraines    MIGRAINES    Past Surgical History:  Procedure Laterality Date  . BREAST BIOPSY Bilateral 10/29/2017   L breast DCIS, R breast invasive mammary carcinoma of no special type, ER/PR+, Her2/neu 1+  . CESAREAN SECTION    . COLONOSCOPY  2012  . IRRIGATION AND DEBRIDEMENT ABSCESS Right 03/12/2018   Procedure: IRRIGATION AND DEBRIDEMENT RIGHT AXILLARY ABSCESS;  Surgeon: Robert Bellow, MD;  Location: [facility] ORS;  Service: General;  Laterality: Right;  . MANDIBLE FRACTURE SURGERY    . MASTECTOMY MODIFIED RADICAL Right 07/10/2018   ypT2 ypN2a ER/PR positive, HER-2/neu negative  Surgeon: Robert Bellow, MD;  Location: [facility] ORS;  Service: General;  Laterality: Right;  . PORTACATH PLACEMENT Left 11/28/2017   Procedure: INSERTION PORT-A-CATH;  Surgeon: Robert Bellow, MD;  Location: [facility] ORS;  Service: General;  Laterality: Left;  . SIMPLE MASTECTOMY WITH AXILLARY SENTINEL NODE BIOPSY Left 07/10/2018   High-grade DCIS SIMPLE MASTECTOMY;  Surgeon: Robert Bellow, MD;  Location: [facility] ORS;  Service: General;  Laterality: Left;    Family History  Problem  Relation Age of Onset  . Cancer Mother        Esophageal Cancer  . Early death Mother        Age 52  . Cancer Father        Lung Cancer  . Diabetes Father   . Diabetes Brother        Controlled with diet  . Heart attack Paternal Grandfather     Social History Social History   Tobacco Use  . Smoking status: Former Smoker    Packs/day: 0.25    Years: 10.00    Pack years: 2.50    Last attempt to quit: 1978    Years since quitting: 41.9  . Smokeless tobacco: Never Used  Substance Use Topics  . Alcohol use: Yes    Alcohol/week: 1.0 standard drinks    Types: 1 Glasses of wine per week    Comment: RARE  . Drug use: No    Allergies  Allergen Reactions  . Peanut-Containing Drug Products Swelling and Other (See Comments)    Only when eating too many peanuts, swelling with lips and tingly face    Current Outpatient Medications  Medication Sig Dispense Refill  . acetaminophen (TYLENOL) 500 MG tablet Take 1,000 mg by mouth every 6 (six) hours as needed for moderate pain or headache.    Marland Kitchen aspirin-acetaminophen-caffeine (EXCEDRIN MIGRAINE) 250-250-65 MG tablet Take 1 tablet by mouth every 6 (six) hours as needed for headache or migraine.    Marland Kitchen b  complex vitamins tablet Take 1 tablet by mouth daily.    Marland Kitchen CALCIUM PO Take 1 tablet by mouth daily.     Marland Kitchen doxycycline (VIBRA-TABS) 100 MG tablet Take 1 tablet (100 mg total) by mouth 2 (two) times daily. With food x 10 days 20 tablet 0  . gabapentin (NEURONTIN) 100 MG capsule Take 2 capsules (200 mg total) by mouth at bedtime. 60 capsule 1  . Multiple Vitamin (MULTIVITAMIN WITH MINERALS) TABS tablet Take 1 tablet by mouth daily.    . mupirocin ointment (BACTROBAN) 2 % Apply 1 application topically 2 (two) times daily. X 5 days to nose and bid x 7-14 days right leg 30 g 1  . polyethylene glycol (MIRALAX / GLYCOLAX) packet Take 17 g by mouth daily as needed (for constipation.).     Marland Kitchen Probiotic Product (PROBIOTIC PO) Take 1 capsule by mouth  daily.    Marland Kitchen tetrahydrozoline 0.05 % ophthalmic solution Place 1 drop into both eyes 3 (three) times daily as needed (for dry/red eyes.).     No current facility-administered medications for this visit.    Facility-Administered Medications Ordered in Other Visits  Medication Dose Route Frequency Provider Last Rate Last Dose  . heparin lock flush 100 unit/mL  500 Units Intracatheter PRN Lequita Asal, MD        Review of Systems Review of Systems  Constitutional: Negative.   Respiratory: Negative.   Cardiovascular: Negative.     Blood pressure 125/79, pulse 78, temperature (!) 97.5 F (36.4 C), temperature source Temporal, resp. rate 18, height 5' 5"  (1.651 m), weight 148 lb 9.6 oz (67.4 kg), SpO2 93 %.  Physical Exam Physical Exam  Constitutional: She is oriented to person, place, and time. She appears well-developed and well-nourished.  Eyes: Conjunctivae are normal. No scleral icterus.  Pulmonary/Chest: Effort normal and breath sounds normal.    Lymphadenopathy:    She has no cervical adenopathy.  Neurological: She is alert and oriented to person, place, and time.  Skin: Skin is warm and dry.   Data Reviewed Measurement of the upper extremities were obtained to the location 15 cm above as well as 10 and 20 cm below the olecranon process.  Right: 31, 25, 19 cm.  Left: 30, 23.5, 18 cm.  Assessment    Doing well post bilateral mastectomy.  Minimal right more so than left upper extremity swelling post formal axillary dissection.    Plan Patient needs to return to the office in February. Will reassess her arm circumference at that time.  Patient will call if she has any new concerns.  HPI, Physical Exam, Assessment and Plan have been scribed under the direction and in the presence of Hervey Ard, Md.  Eudelia Bunch R. Bobette Mo, CMA  I have completed the exam and reviewed the above documentation for accuracy and completeness.  I agree with the above.  Development worker, community has been used and any errors in dictation or transcription are unintentional.  Hervey Ard, M.D., F.A.C.S.   Forest Gleason Cici Rodriges 10/13/2018, 9:11 PM

## 2018-10-14 ENCOUNTER — Ambulatory Visit (INDEPENDENT_AMBULATORY_CARE_PROVIDER_SITE_OTHER): Payer: 59 | Admitting: Family

## 2018-10-14 ENCOUNTER — Encounter: Payer: Self-pay | Admitting: Family

## 2018-10-14 ENCOUNTER — Ambulatory Visit
Admission: RE | Admit: 2018-10-14 | Discharge: 2018-10-14 | Disposition: A | Discharge status: Home / Self Care | Payer: 59 | Source: Ambulatory Visit | Attending: Radiation Oncology | Admitting: Radiation Oncology

## 2018-10-14 VITALS — BP 116/72 | HR 83 | Temp 98.5°F | Wt 151.0 lb

## 2018-10-14 DIAGNOSIS — Z1211 Encounter for screening for malignant neoplasm of colon: Secondary | ICD-10-CM

## 2018-10-14 DIAGNOSIS — Z510 Encounter for antineoplastic radiation therapy: Secondary | ICD-10-CM | POA: Diagnosis not present

## 2018-10-14 DIAGNOSIS — Z23 Encounter for immunization: Secondary | ICD-10-CM

## 2018-10-14 DIAGNOSIS — C50411 Malignant neoplasm of upper-outer quadrant of right female breast: Secondary | ICD-10-CM | POA: Diagnosis not present

## 2018-10-14 DIAGNOSIS — R7303 Prediabetes: Secondary | ICD-10-CM | POA: Insufficient documentation

## 2018-10-14 DIAGNOSIS — Z170 Estrogen receptor positive status [ER+]: Secondary | ICD-10-CM | POA: Diagnosis not present

## 2018-10-14 IMAGING — CR DG CHEST 2V
1 series · 2 of 2 positions shown · non-contrast
Comparison: 11/28/2017

CLINICAL DATA: Fever.  Breast cancer

EXAM:
CHEST - 2 VIEW

[Series 1: dg chest 2 view · 0.14mm/px · 2 of 2 slices shown]
[im 1/2]
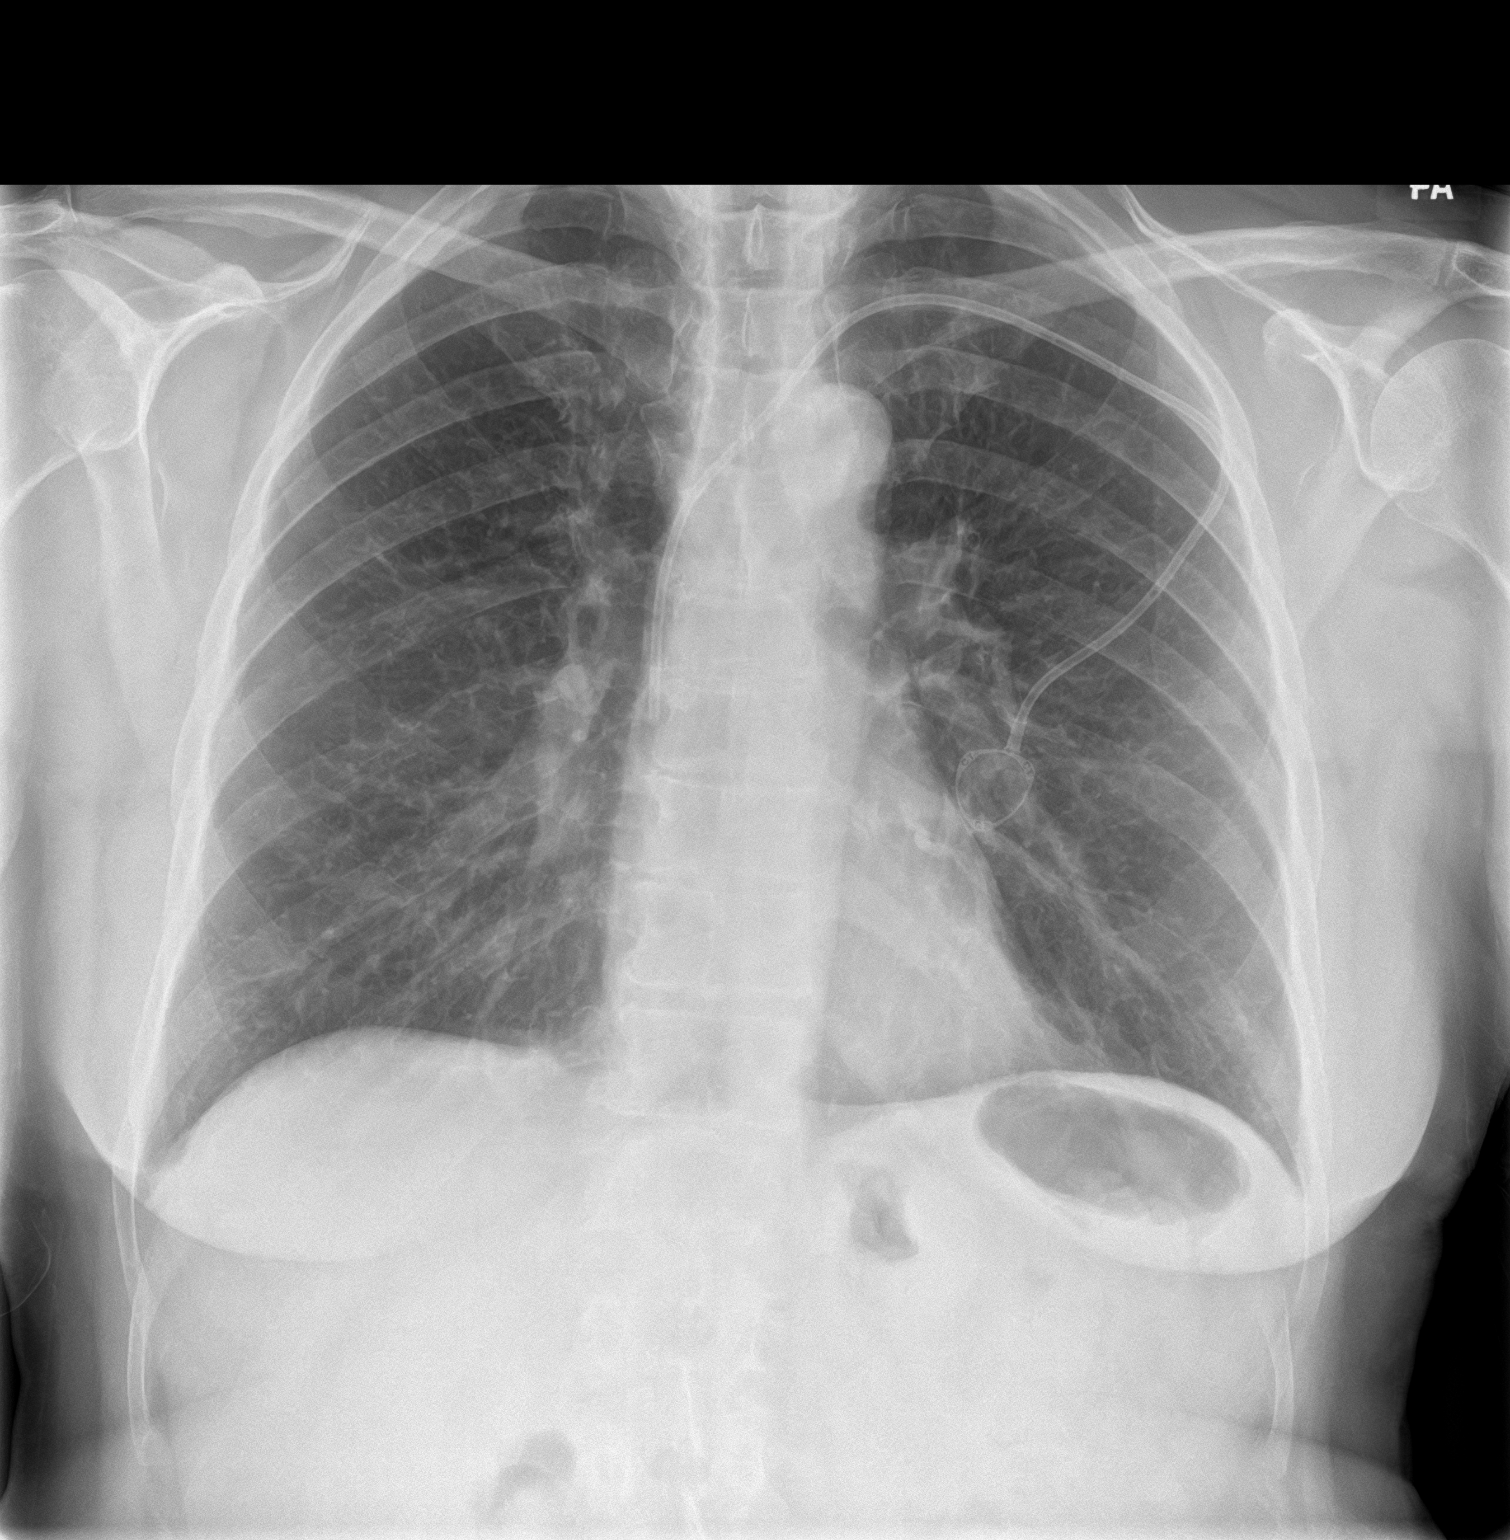
[im 2/2]
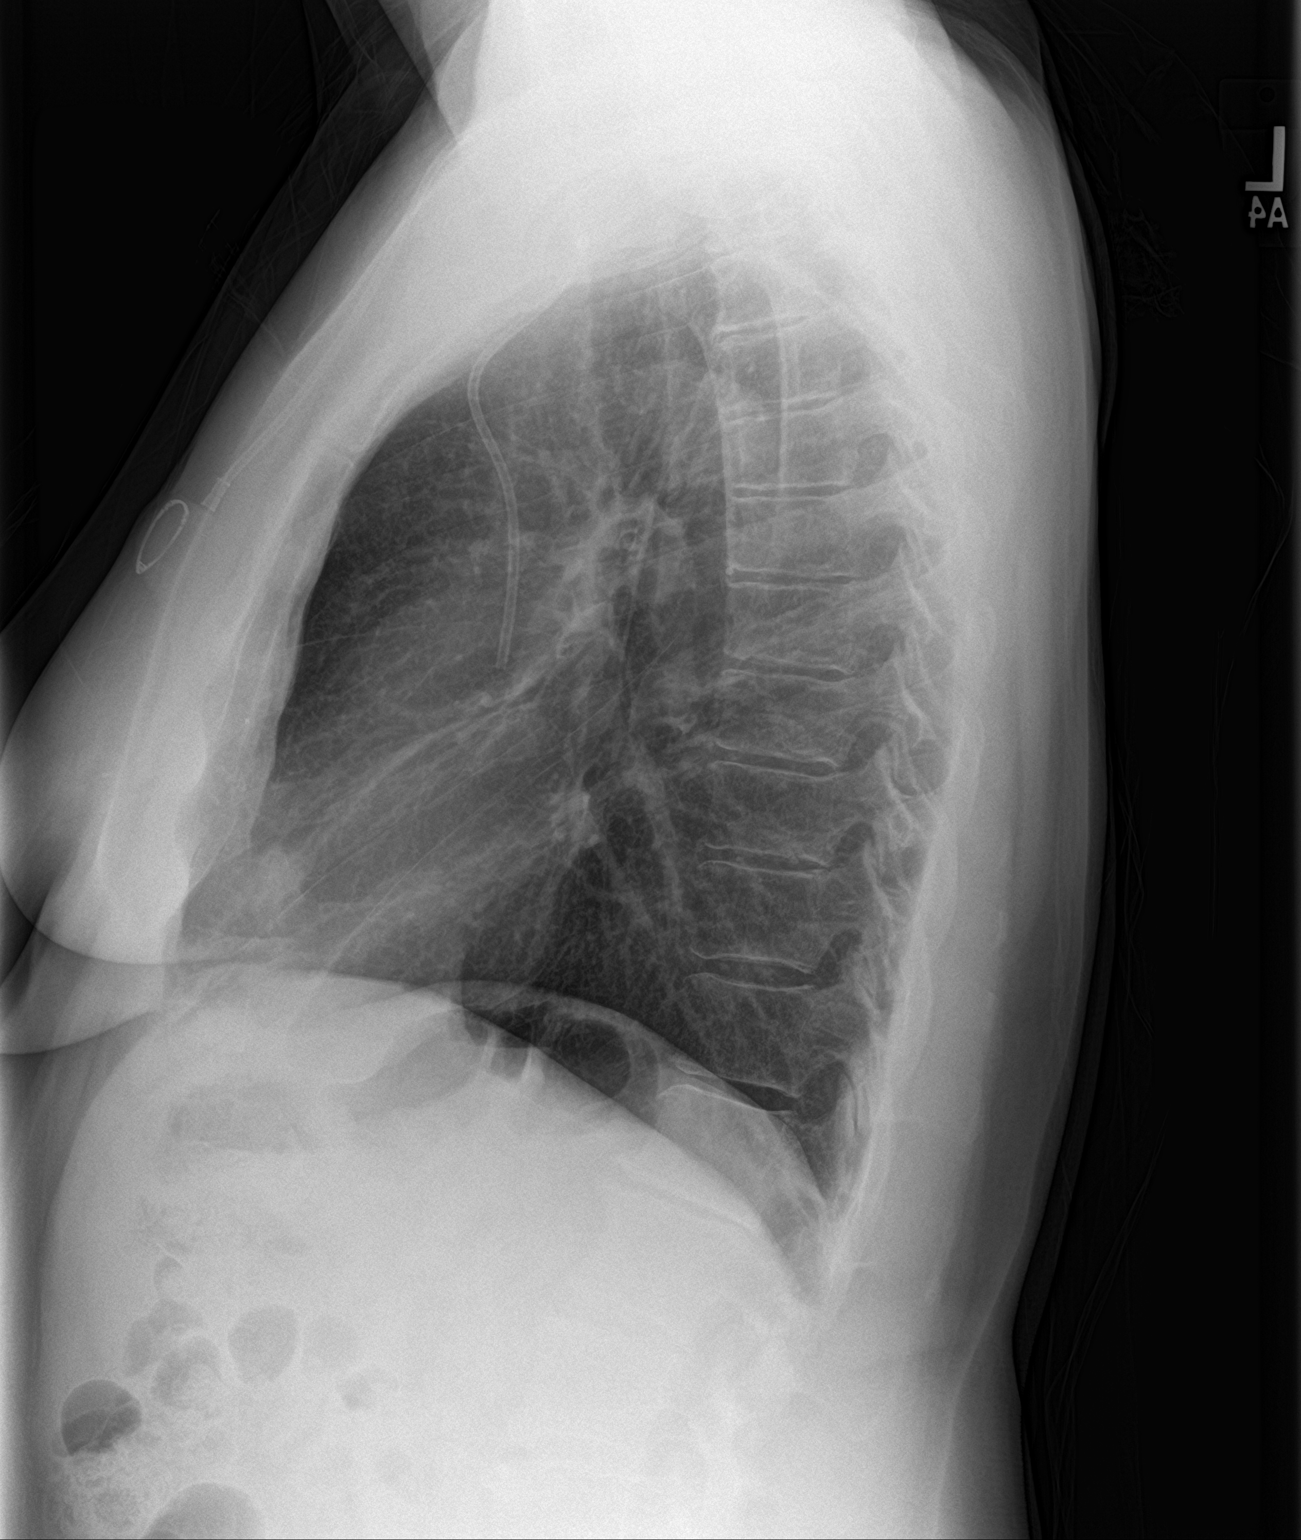

[2 of 2 positions shown; findings below may reference images not displayed]

FINDINGS: Lungs are clear without infiltrate or effusion. Negative for heart
failure. Cardiac and mediastinal contours normal. Port-A-Cath tip in
the SVC.
IMPRESSION: No active cardiopulmonary disease.

## 2018-10-14 NOTE — Patient Instructions (Addendum)
Today we discussed referrals, orders. Colonoscopy   I have placed these orders in the system for you.  Please be sure to give [place] a call if you have not heard from our office regarding this. We should hear from [place] within ONE week with information regarding your appointment. If not, please let me know immediately.    This is  Dr. Lupita Dawn  example of a  "Low GI"  Diet:  It will allow you to lose 4 to 8  lbs  per month if you follow it carefully.  Your goal with exercise is a minimum of 30 minutes of aerobic exercise 5 days per week (Walking does not count once it becomes easy!)    All of the foods can be found at grocery stores and in bulk at [employer].  The Atkins protein bars and shakes are available in more varieties at [employer], [employer] and [place].     7 AM Breakfast:  Choose from the following:  Low carbohydrate Protein  Shakes (I recommend the  Premier Protein chocolate shakes,  EAS AdvantEdge "Carb Control" shakes  Or the Atkins shakes all are under 3 net carbs)     a scrambled egg/bacon/cheese burrito made with [employer]'s "carb balance" whole wheat tortilla  (about 10 net carbs )  Regulatory affairs officer (basically a quiche without the pastry crust) that is eaten cold and very convenient way to get your eggs.  8 carbs)  If you make your own protein shakes, avoid bananas and pineapple,  And use low carb greek yogurt or original /unsweetened almond or soy milk    Avoid cereal and bananas, oatmeal and cream of wheat and grits. They are loaded with carbohydrates!   10 AM: high protein snack:  Protein bar by Atkins (the snack size, under 200 cal, usually < 6 net carbs).    A stick of cheese:  Around 1 carb,  100 cal     Dannon Light n Fit [place] Yogurt  (80 cal, 8 carbs)  Other so called "protein bars" and Greek yogurts tend to be loaded with carbohydrates.  Remember, in food advertising, the word "energy" is synonymous for " carbohydrate."  Lunch:   A Sandwich using the  bread choices listed, Can use any  Eggs,  lunchmeat, grilled meat or canned tuna), avocado, regular mayo/mustard  and cheese.  A Salad using blue cheese, ranch,  Goddess or vinagrette,  Avoid taco shells, croutons or "confetti" and no "candied nuts" but regular nuts OK.   No pretzels, nabs  or chips.  Pickles and miniature sweet peppers are a good low carb alternative that provide a "crunch"  The bread is the only source of carbohydrate in a sandwich and  can be decreased by trying some of the attached alternatives to traditional loaf bread   Avoid "Low fat dressings, as well as [place] dressings They are loaded with sugar!   3 PM/ Mid day  Snack:  Consider  1 ounce of  almonds, walnuts, pistachios, pecans, peanuts,  Macadamia nuts or a nut medley.  Avoid "granola and granola bars "  Mixed nuts are ok in moderation as long as there are no raisins,  cranberries or dried fruit.   KIND bars are OK if you get the low glycemic index variety   Try the prosciutto/mozzarella cheese sticks by [employer]  In deli /backery section   High protein      6 PM  Dinner:  Meat/fowl/fish with a green salad, and either broccoli, cauliflower, green beans, spinach, brussel sprouts or  Lima beans. DO NOT BREAD THE PROTEIN!!      There is a low carb pasta by Dreamfield's that is acceptable and tastes great: only 5 digestible carbs/serving.( All grocery stores but BJs carry it ) Several ready made meals are available low carb:   Try Michel Angelo's chicken piccata or chicken or eggplant parm over low carb pasta.(Lowes and BJs)   Marjory Lies Sanchez's "Carnitas" (pulled pork, no sauce,  0 carbs) or his beef pot roast to make a dinner burrito (at [employer]J's)  Pesto over low carb pasta (bj's sells a good quality pesto in the center refrigerated section of the deli   Try satueeing  Cheral Marker with mushroooms as a good side   [employer] Giant makes a mashed cauliflower that tastes like mashed potatoes  Whole wheat  pasta is still full of digestible carbs and  Not as low in glycemic index as Dreamfield's.   Brown rice is still rice,  So skip the rice and noodles if you eat [place] or [place] (or at least limit to 1/2 cup)  9 PM snack :   Breyer's "low carb" fudgsicle or  ice cream bar (Carb Smart line), or  Weight Watcher's ice cream bar , or another "no sugar added" ice cream;  a serving of fresh berries/cherries with whipped cream   Cheese or DANNON'S LlGHT N FIT GREEK YOGURT  8 ounces of [place] unsweetened almond/cococunut milk    Treat yourself to a parfait made with whipped cream blueberiies, walnuts and vanilla greek yogurt  Avoid bananas, pineapple, grapes  and watermelon on a regular basis because they are high in sugar.  THINK OF THEM AS DESSERT  Remember that snack Substitutions should be less than 10 NET carbs per serving and meals < 20 carbs. Remember to subtract fiber grams to get the "net carbs."  @TULLOBREADPACKAGE @

## 2018-10-14 NOTE — Progress Notes (Signed)
Subjective:    Patient ID: Megan Vega, female    DOB: 1956-01-28, 62 y.o.   MRN: [id number]  CC: Chairty Vega is a 62 y.o. female who presents today for follow up.   HPI: Doing well. No complaints today Back to work and enjoying it.   Had peripheral neuropathy - since improved;has not needed gabapentin.   Currently during radiation through 11/2018.  S/p Chemo  Plans for breast reconstruction after radiation.   Had colonoscopy at 62years old, known polyps   Prediabetes        Follows with Dr.Brynett; double mastectomy 06/2018 HISTORY:  Past Medical History:  Diagnosis Date  . Ankle fracture   . Cancer [facility])    Basal Cell Carcinoma, about 2011 chest  . Cataract   . Family history of adverse reaction to anesthesia    DAUGHTER-N/V  . Hemorrhoids   . History of methicillin resistant staphylococcus aureus (MRSA) 2015  . Migraines    MIGRAINES   Past Surgical History:  Procedure Laterality Date  . BREAST BIOPSY Bilateral 10/29/2017   L breast DCIS, R breast invasive mammary carcinoma of no special type, ER/PR+, Her2/neu 1+  . CESAREAN SECTION    . COLONOSCOPY  2012  . IRRIGATION AND DEBRIDEMENT ABSCESS Right 03/12/2018   Procedure: IRRIGATION AND DEBRIDEMENT RIGHT AXILLARY ABSCESS;  Surgeon: Robert Bellow, MD;  Location: [facility] ORS;  Service: General;  Laterality: Right;  . MANDIBLE FRACTURE SURGERY    . MASTECTOMY MODIFIED RADICAL Right 07/10/2018   ypT2 ypN2a ER/PR positive, HER-2/neu negative  Surgeon: Robert Bellow, MD;  Location: [facility] ORS;  Service: General;  Laterality: Right;  . PORTACATH PLACEMENT Left 11/28/2017   Procedure: INSERTION PORT-A-CATH;  Surgeon: Robert Bellow, MD;  Location: [facility] ORS;  Service: General;  Laterality: Left;  . SIMPLE MASTECTOMY WITH AXILLARY SENTINEL NODE BIOPSY Left 07/10/2018   High-grade DCIS SIMPLE MASTECTOMY;  Surgeon: Robert Bellow, MD;  Location: [facility] ORS;  Service: General;  Laterality: Left;   Family  History  Problem Relation Age of Onset  . Cancer Mother        Esophageal Cancer  . Early death Mother        Age 26  . Cancer Father        Lung Cancer  . Diabetes Father   . Diabetes Brother        Controlled with diet  . Heart attack Paternal Grandfather   . Colon cancer Neg Hx     Allergies: Peanut-containing drug products Current Outpatient Medications on File Prior to Visit  Medication Sig Dispense Refill  . acetaminophen (TYLENOL) 500 MG tablet Take 1,000 mg by mouth every 6 (six) hours as needed for moderate pain or headache.    Marland Kitchen aspirin-acetaminophen-caffeine (EXCEDRIN MIGRAINE) 250-250-65 MG tablet Take 1 tablet by mouth every 6 (six) hours as needed for headache or migraine.    Marland Kitchen b complex vitamins tablet Take 1 tablet by mouth daily.    Marland Kitchen CALCIUM PO Take 1 tablet by mouth daily.     Marland Kitchen gabapentin (NEURONTIN) 100 MG capsule Take 2 capsules (200 mg total) by mouth at bedtime. 60 capsule 1  . Multiple Vitamin (MULTIVITAMIN WITH MINERALS) TABS tablet Take 1 tablet by mouth daily.    . mupirocin ointment (BACTROBAN) 2 % Apply 1 application topically 2 (two) times daily. X 5 days to nose and bid x 7-14 days right leg 30 g 1  . polyethylene glycol (MIRALAX / GLYCOLAX) packet Take 17 g  by mouth daily as needed (for constipation.).     Marland Kitchen Probiotic Product (PROBIOTIC PO) Take 1 capsule by mouth daily.    Marland Kitchen tetrahydrozoline 0.05 % ophthalmic solution Place 1 drop into both eyes 3 (three) times daily as needed (for dry/red eyes.).     Current Facility-Administered Medications on File Prior to Visit  Medication Dose Route Frequency Provider Last Rate Last Dose  . heparin lock flush 100 unit/mL  500 Units Intracatheter PRN Lequita Asal, MD        Social History   Tobacco Use  . Smoking status: Former Smoker    Packs/day: 0.25    Years: 10.00    Pack years: 2.50    Last attempt to quit: 1978    Years since quitting: 41.9  . Smokeless tobacco: Never Used  Substance Use  Topics  . Alcohol use: Yes    Alcohol/week: 1.0 standard drinks    Types: 1 Glasses of wine per week    Comment: RARE  . Drug use: No    Review of Systems  Constitutional: Negative for chills and fever.  Respiratory: Negative for cough.   Cardiovascular: Negative for chest pain and palpitations.  Gastrointestinal: Negative for nausea and vomiting.  Neurological: Negative for numbness (resolved).      Objective:    BP 116/72 (BP Location: Right Arm, Patient Position: Sitting, Cuff Size: Normal)   Pulse 83   Temp 98.5 F (36.9 C)   Wt 151 lb (68.5 kg)   SpO2 97%   BMI 25.13 kg/m  BP Readings from Last 3 Encounters:  10/14/18 116/72  10/13/18 125/79  09/10/18 130/90   Wt Readings from Last 3 Encounters:  10/14/18 151 lb (68.5 kg)  10/13/18 148 lb 9.6 oz (67.4 kg)  09/10/18 148 lb 8 oz (67.4 kg)    Physical Exam  Constitutional: She appears well-developed and well-nourished.  Eyes: Conjunctivae are normal.  Cardiovascular: Normal rate, regular rhythm, normal heart sounds and normal pulses.  Pulmonary/Chest: Effort normal and breath sounds normal. She has no wheezes. She has no rhonchi. She has no rales.  Neurological: She is alert.  Skin: Skin is warm and dry.  Psychiatric: She has a normal mood and affect. Her speech is normal and behavior is normal. Thought content normal.  Vitals reviewed.      Assessment & Plan:   Problem List Items Addressed This Visit      Other   Screen for colon cancer - Primary    Pending referral to GI for colonoscopy.      Relevant Orders   Ambulatory referral to Gastroenterology   Prediabetes    Based on recent lab work in October, patient appears to be in the prediabetic range.  We will continue to follow this.  Encouraged exercise today.       Other Visit Diagnoses    Need for immunization against influenza       Relevant Orders   Flu Vaccine QUAD 36+ mos IM (Completed)       I have discontinued Bresha Villafranca's  doxycycline. I am also having her maintain her b complex vitamins, acetaminophen, polyethylene glycol, multivitamin with minerals, CALCIUM PO, tetrahydrozoline, Probiotic Product (PROBIOTIC PO), aspirin-acetaminophen-caffeine, gabapentin, and mupirocin ointment.   No orders of the defined types were placed in this encounter.   Return precautions given.   Risks, benefits, and alternatives of the medications and treatment plan prescribed today were discussed, and patient expressed understanding.   Education regarding symptom management and diagnosis  given to patient on AVS.  Continue to follow with Burnard Hawthorne, FNP for routine health maintenance.   Megan Vega and I agreed with plan.   Mable Paris, FNP

## 2018-10-14 NOTE — Assessment & Plan Note (Signed)
Pending referral to GI for colonoscopy.

## 2018-10-14 NOTE — Assessment & Plan Note (Signed)
Based on recent lab work in October, patient appears to be in the prediabetic range.  We will continue to follow this.  Encouraged exercise today.

## 2018-10-19 ENCOUNTER — Ambulatory Visit
Admission: RE | Admit: 2018-10-19 | Discharge: 2018-10-19 | Disposition: A | Discharge status: Home / Self Care | Payer: 59 | Source: Ambulatory Visit | Attending: Radiation Oncology | Admitting: Radiation Oncology

## 2018-10-19 DIAGNOSIS — Z510 Encounter for antineoplastic radiation therapy: Secondary | ICD-10-CM

## 2018-10-19 DIAGNOSIS — C773 Secondary and unspecified malignant neoplasm of axilla and upper limb lymph nodes: Secondary | ICD-10-CM

## 2018-10-19 DIAGNOSIS — Z170 Estrogen receptor positive status [ER+]: Secondary | ICD-10-CM

## 2018-10-19 DIAGNOSIS — C50411 Malignant neoplasm of upper-outer quadrant of right female breast: Secondary | ICD-10-CM | POA: Insufficient documentation

## 2018-10-20 ENCOUNTER — Ambulatory Visit
Admission: RE | Admit: 2018-10-20 | Discharge: 2018-10-20 | Disposition: A | Discharge status: Home / Self Care | Payer: 59 | Source: Ambulatory Visit | Attending: Radiation Oncology | Admitting: Radiation Oncology

## 2018-10-20 DIAGNOSIS — Z170 Estrogen receptor positive status [ER+]: Secondary | ICD-10-CM | POA: Diagnosis not present

## 2018-10-20 DIAGNOSIS — C50411 Malignant neoplasm of upper-outer quadrant of right female breast: Secondary | ICD-10-CM | POA: Diagnosis not present

## 2018-10-21 ENCOUNTER — Ambulatory Visit
Admission: RE | Admit: 2018-10-21 | Discharge: 2018-10-21 | Disposition: A | Discharge status: Home / Self Care | Payer: 59 | Source: Ambulatory Visit | Attending: Radiation Oncology | Admitting: Radiation Oncology

## 2018-10-21 ENCOUNTER — Other Ambulatory Visit: Payer: Self-pay

## 2018-10-21 ENCOUNTER — Inpatient Hospital Stay: Payer: 59 | Attending: Radiation Oncology

## 2018-10-21 DIAGNOSIS — C50411 Malignant neoplasm of upper-outer quadrant of right female breast: Secondary | ICD-10-CM

## 2018-10-21 DIAGNOSIS — Z170 Estrogen receptor positive status [ER+]: Secondary | ICD-10-CM | POA: Diagnosis not present

## 2018-10-21 LAB — CBC
HCT: 41.3 % (ref 36.0–46.0)
HEMOGLOBIN: 13.7 g/dL (ref 12.0–15.0)
MCH: 28 pg (ref 26.0–34.0)
MCHC: 33.2 g/dL (ref 30.0–36.0)
MCV: 84.3 fL (ref 80.0–100.0)
NRBC: 0 % (ref 0.0–0.2)
PLATELETS: 199 10*3/uL (ref 150–400)
RBC: 4.9 MIL/uL (ref 3.87–5.11)
RDW: 13.5 % (ref 11.5–15.5)
WBC: 4.2 10*3/uL (ref 4.0–10.5)

## 2018-10-22 ENCOUNTER — Ambulatory Visit
Admission: RE | Admit: 2018-10-22 | Discharge: 2018-10-22 | Disposition: A | Discharge status: Home / Self Care | Payer: 59 | Source: Ambulatory Visit | Attending: Radiation Oncology | Admitting: Radiation Oncology

## 2018-10-22 DIAGNOSIS — C50411 Malignant neoplasm of upper-outer quadrant of right female breast: Secondary | ICD-10-CM | POA: Diagnosis not present

## 2018-10-22 DIAGNOSIS — Z170 Estrogen receptor positive status [ER+]: Secondary | ICD-10-CM | POA: Diagnosis not present

## 2018-10-23 ENCOUNTER — Ambulatory Visit
Admission: RE | Admit: 2018-10-23 | Discharge: 2018-10-23 | Disposition: A | Discharge status: Home / Self Care | Payer: 59 | Source: Ambulatory Visit | Attending: Radiation Oncology | Admitting: Radiation Oncology

## 2018-10-23 DIAGNOSIS — C50411 Malignant neoplasm of upper-outer quadrant of right female breast: Secondary | ICD-10-CM | POA: Diagnosis not present

## 2018-10-23 DIAGNOSIS — Z170 Estrogen receptor positive status [ER+]: Secondary | ICD-10-CM | POA: Diagnosis not present

## 2018-10-26 ENCOUNTER — Ambulatory Visit
Admission: RE | Admit: 2018-10-26 | Discharge: 2018-10-26 | Disposition: A | Discharge status: Home / Self Care | Payer: 59 | Source: Ambulatory Visit | Attending: Radiation Oncology | Admitting: Radiation Oncology

## 2018-10-26 DIAGNOSIS — Z170 Estrogen receptor positive status [ER+]: Secondary | ICD-10-CM | POA: Diagnosis not present

## 2018-10-26 DIAGNOSIS — C50411 Malignant neoplasm of upper-outer quadrant of right female breast: Secondary | ICD-10-CM | POA: Diagnosis not present

## 2018-10-27 ENCOUNTER — Ambulatory Visit
Admission: RE | Admit: 2018-10-27 | Discharge: 2018-10-27 | Disposition: A | Discharge status: Home / Self Care | Payer: 59 | Source: Ambulatory Visit | Attending: Radiation Oncology | Admitting: Radiation Oncology

## 2018-10-27 ENCOUNTER — Ambulatory Visit: Payer: 59

## 2018-10-27 DIAGNOSIS — Z170 Estrogen receptor positive status [ER+]: Secondary | ICD-10-CM | POA: Diagnosis not present

## 2018-10-27 DIAGNOSIS — C50411 Malignant neoplasm of upper-outer quadrant of right female breast: Secondary | ICD-10-CM | POA: Diagnosis not present

## 2018-10-28 ENCOUNTER — Ambulatory Visit
Admission: RE | Admit: 2018-10-28 | Discharge: 2018-10-28 | Disposition: A | Discharge status: Home / Self Care | Payer: 59 | Source: Ambulatory Visit | Attending: Radiation Oncology | Admitting: Radiation Oncology

## 2018-10-28 DIAGNOSIS — C50411 Malignant neoplasm of upper-outer quadrant of right female breast: Secondary | ICD-10-CM | POA: Diagnosis not present

## 2018-10-28 DIAGNOSIS — Z170 Estrogen receptor positive status [ER+]: Secondary | ICD-10-CM | POA: Diagnosis not present

## 2018-10-29 ENCOUNTER — Ambulatory Visit
Admission: RE | Admit: 2018-10-29 | Discharge: 2018-10-29 | Disposition: A | Discharge status: Home / Self Care | Payer: 59 | Source: Ambulatory Visit | Attending: Radiation Oncology | Admitting: Radiation Oncology

## 2018-10-29 DIAGNOSIS — C50411 Malignant neoplasm of upper-outer quadrant of right female breast: Secondary | ICD-10-CM | POA: Diagnosis not present

## 2018-10-30 ENCOUNTER — Ambulatory Visit: Payer: 59

## 2018-10-30 ENCOUNTER — Ambulatory Visit
Admission: RE | Admit: 2018-10-30 | Discharge: 2018-10-30 | Disposition: A | Discharge status: Home / Self Care | Payer: 59 | Source: Ambulatory Visit | Attending: Radiation Oncology | Admitting: Radiation Oncology

## 2018-10-30 ENCOUNTER — Other Ambulatory Visit: Payer: Self-pay

## 2018-10-30 ENCOUNTER — Telehealth: Payer: Self-pay

## 2018-10-30 DIAGNOSIS — Z1211 Encounter for screening for malignant neoplasm of colon: Secondary | ICD-10-CM

## 2018-10-30 DIAGNOSIS — Z170 Estrogen receptor positive status [ER+]: Secondary | ICD-10-CM | POA: Diagnosis not present

## 2018-10-30 DIAGNOSIS — C50411 Malignant neoplasm of upper-outer quadrant of right female breast: Secondary | ICD-10-CM | POA: Diagnosis not present

## 2018-10-30 NOTE — Telephone Encounter (Signed)
Pt left vm she is returning a call for [employer]

## 2018-11-02 ENCOUNTER — Ambulatory Visit
Admission: RE | Admit: 2018-11-02 | Discharge: 2018-11-02 | Disposition: A | Discharge status: Home / Self Care | Payer: 59 | Source: Ambulatory Visit | Attending: Radiation Oncology | Admitting: Radiation Oncology

## 2018-11-02 DIAGNOSIS — Z170 Estrogen receptor positive status [ER+]: Secondary | ICD-10-CM | POA: Diagnosis not present

## 2018-11-02 DIAGNOSIS — C50411 Malignant neoplasm of upper-outer quadrant of right female breast: Secondary | ICD-10-CM | POA: Diagnosis not present

## 2018-11-03 ENCOUNTER — Ambulatory Visit
Admission: RE | Admit: 2018-11-03 | Discharge: 2018-11-03 | Disposition: A | Discharge status: Home / Self Care | Payer: 59 | Source: Ambulatory Visit | Attending: Radiation Oncology | Admitting: Radiation Oncology

## 2018-11-03 DIAGNOSIS — Z170 Estrogen receptor positive status [ER+]: Secondary | ICD-10-CM | POA: Diagnosis not present

## 2018-11-03 DIAGNOSIS — C50411 Malignant neoplasm of upper-outer quadrant of right female breast: Secondary | ICD-10-CM | POA: Diagnosis not present

## 2018-11-04 ENCOUNTER — Ambulatory Visit
Admission: RE | Admit: 2018-11-04 | Discharge: 2018-11-04 | Disposition: A | Discharge status: Home / Self Care | Payer: 59 | Source: Ambulatory Visit | Attending: Radiation Oncology | Admitting: Radiation Oncology

## 2018-11-04 ENCOUNTER — Inpatient Hospital Stay: Payer: 59

## 2018-11-04 DIAGNOSIS — C50411 Malignant neoplasm of upper-outer quadrant of right female breast: Secondary | ICD-10-CM

## 2018-11-04 DIAGNOSIS — Z170 Estrogen receptor positive status [ER+]: Secondary | ICD-10-CM | POA: Diagnosis not present

## 2018-11-04 LAB — CBC
HCT: 41.3 % (ref 36.0–46.0)
Hemoglobin: 13.6 g/dL (ref 12.0–15.0)
MCH: 27.9 pg (ref 26.0–34.0)
MCHC: 32.9 g/dL (ref 30.0–36.0)
MCV: 84.8 fL (ref 80.0–100.0)
Platelets: 196 10*3/uL (ref 150–400)
RBC: 4.87 MIL/uL (ref 3.87–5.11)
RDW: 13.6 % (ref 11.5–15.5)
WBC: 4.1 10*3/uL (ref 4.0–10.5)
nRBC: 0 % (ref 0.0–0.2)

## 2018-11-05 ENCOUNTER — Ambulatory Visit
Admission: RE | Admit: 2018-11-05 | Discharge: 2018-11-05 | Disposition: A | Discharge status: Home / Self Care | Payer: 59 | Source: Ambulatory Visit | Attending: Radiation Oncology | Admitting: Radiation Oncology

## 2018-11-05 DIAGNOSIS — C50411 Malignant neoplasm of upper-outer quadrant of right female breast: Secondary | ICD-10-CM | POA: Diagnosis not present

## 2018-11-05 DIAGNOSIS — Z170 Estrogen receptor positive status [ER+]: Secondary | ICD-10-CM | POA: Diagnosis not present

## 2018-11-06 ENCOUNTER — Ambulatory Visit: Payer: 59

## 2018-11-09 ENCOUNTER — Ambulatory Visit
Admission: RE | Admit: 2018-11-09 | Discharge: 2018-11-09 | Disposition: A | Discharge status: Home / Self Care | Payer: 59 | Source: Ambulatory Visit | Attending: Radiation Oncology | Admitting: Radiation Oncology

## 2018-11-09 DIAGNOSIS — C50411 Malignant neoplasm of upper-outer quadrant of right female breast: Secondary | ICD-10-CM | POA: Diagnosis not present

## 2018-11-09 DIAGNOSIS — Z170 Estrogen receptor positive status [ER+]: Secondary | ICD-10-CM | POA: Diagnosis not present

## 2018-11-10 ENCOUNTER — Ambulatory Visit
Admission: RE | Admit: 2018-11-10 | Discharge: 2018-11-10 | Disposition: A | Discharge status: Home / Self Care | Payer: 59 | Source: Ambulatory Visit | Attending: Radiation Oncology | Admitting: Radiation Oncology

## 2018-11-10 DIAGNOSIS — Z170 Estrogen receptor positive status [ER+]: Secondary | ICD-10-CM | POA: Diagnosis not present

## 2018-11-10 DIAGNOSIS — C50411 Malignant neoplasm of upper-outer quadrant of right female breast: Secondary | ICD-10-CM | POA: Diagnosis not present

## 2018-11-12 ENCOUNTER — Ambulatory Visit
Admission: RE | Admit: 2018-11-12 | Discharge: 2018-11-12 | Disposition: A | Discharge status: Home / Self Care | Payer: 59 | Source: Ambulatory Visit | Attending: Radiation Oncology | Admitting: Radiation Oncology

## 2018-11-12 DIAGNOSIS — Z170 Estrogen receptor positive status [ER+]: Secondary | ICD-10-CM | POA: Diagnosis not present

## 2018-11-12 DIAGNOSIS — C50411 Malignant neoplasm of upper-outer quadrant of right female breast: Secondary | ICD-10-CM | POA: Diagnosis not present

## 2018-11-13 ENCOUNTER — Ambulatory Visit
Admission: RE | Admit: 2018-11-13 | Discharge: 2018-11-13 | Disposition: A | Discharge status: Home / Self Care | Payer: 59 | Source: Ambulatory Visit | Attending: Radiation Oncology | Admitting: Radiation Oncology

## 2018-11-13 DIAGNOSIS — C50411 Malignant neoplasm of upper-outer quadrant of right female breast: Secondary | ICD-10-CM | POA: Diagnosis not present

## 2018-11-13 DIAGNOSIS — Z170 Estrogen receptor positive status [ER+]: Secondary | ICD-10-CM | POA: Diagnosis not present

## 2018-11-16 ENCOUNTER — Ambulatory Visit
Admission: RE | Admit: 2018-11-16 | Discharge: 2018-11-16 | Disposition: A | Discharge status: Home / Self Care | Payer: 59 | Source: Ambulatory Visit | Attending: Radiation Oncology | Admitting: Radiation Oncology

## 2018-11-16 DIAGNOSIS — Z170 Estrogen receptor positive status [ER+]: Secondary | ICD-10-CM | POA: Diagnosis not present

## 2018-11-16 DIAGNOSIS — C50411 Malignant neoplasm of upper-outer quadrant of right female breast: Secondary | ICD-10-CM | POA: Diagnosis not present

## 2018-11-17 ENCOUNTER — Ambulatory Visit
Admission: RE | Admit: 2018-11-17 | Discharge: 2018-11-17 | Disposition: A | Discharge status: Home / Self Care | Payer: 59 | Source: Ambulatory Visit | Attending: Radiation Oncology | Admitting: Radiation Oncology

## 2018-11-17 DIAGNOSIS — C50411 Malignant neoplasm of upper-outer quadrant of right female breast: Secondary | ICD-10-CM | POA: Diagnosis not present

## 2018-11-17 DIAGNOSIS — Z170 Estrogen receptor positive status [ER+]: Secondary | ICD-10-CM | POA: Diagnosis not present

## 2018-11-19 ENCOUNTER — Ambulatory Visit
Admission: RE | Admit: 2018-11-19 | Discharge: 2018-11-19 | Disposition: A | Discharge status: Home / Self Care | Payer: 59 | Source: Ambulatory Visit | Attending: Radiation Oncology | Admitting: Radiation Oncology

## 2018-11-19 ENCOUNTER — Ambulatory Visit: Payer: 59

## 2018-11-19 ENCOUNTER — Inpatient Hospital Stay: Payer: 59

## 2018-11-19 ENCOUNTER — Encounter: Payer: Self-pay | Admitting: *Deleted

## 2018-11-19 DIAGNOSIS — Z170 Estrogen receptor positive status [ER+]: Secondary | ICD-10-CM | POA: Diagnosis not present

## 2018-11-19 DIAGNOSIS — Z95828 Presence of other vascular implants and grafts: Secondary | ICD-10-CM

## 2018-11-19 DIAGNOSIS — Z510 Encounter for antineoplastic radiation therapy: Secondary | ICD-10-CM | POA: Insufficient documentation

## 2018-11-19 DIAGNOSIS — C773 Secondary and unspecified malignant neoplasm of axilla and upper limb lymph nodes: Secondary | ICD-10-CM | POA: Insufficient documentation

## 2018-11-19 DIAGNOSIS — C50411 Malignant neoplasm of upper-outer quadrant of right female breast: Secondary | ICD-10-CM

## 2018-11-19 MED ORDER — HEPARIN SOD (PORK) LOCK FLUSH 100 UNIT/ML IV SOLN
500.0000 [IU] | Freq: Once | INTRAVENOUS | Status: AC
  Administered 2018-11-19: 500 [IU] via INTRAVENOUS
  Filled 2018-11-19: qty 5

## 2018-11-19 MED ORDER — SODIUM CHLORIDE 0.9% FLUSH
10.0000 mL | INTRAVENOUS | Status: DC | PRN
  Administered 2018-11-19: 10 mL via INTRAVENOUS
  Filled 2018-11-19: qty 10

## 2018-11-19 NOTE — Research (Signed)
Met with patient Lan Entsminger this afternoon while she was in clinic for a port-a-cath flush. Patient had previously taken a copy of the Informed Consent Form home for the [place] study. Ms. Kleiber states she read the consent form and is concerned about how involved the study is, that it is a five year commitment and is especially concerned about the potential side effects and specifically discussed the list of more common side effects. Patient states she does not want to participate in this one, but would be open to looking at another study. Talked with Ms. Coats about the [name] study taking low-dose aspirin or placebo daily and she states she would like to learn more about it. Copy of ICF for the [facility] [phone] [name] study given to patient to take home and review along with contact information for Raynelle Dick, RN and myself in the event she has questions. Plan to touch base with patient about her interest in the study either on Monday (her last radiation treatment) or at her follow up appointment with Dr. Mike Gip. [name], BSN, MHA, OCN 11/19/2018 4:04 PM

## 2018-11-20 ENCOUNTER — Ambulatory Visit
Admission: RE | Admit: 2018-11-20 | Discharge: 2018-11-20 | Disposition: A | Discharge status: Home / Self Care | Payer: 59 | Source: Ambulatory Visit | Attending: Radiation Oncology | Admitting: Radiation Oncology

## 2018-11-20 ENCOUNTER — Ambulatory Visit: Payer: 59

## 2018-11-20 DIAGNOSIS — Z510 Encounter for antineoplastic radiation therapy: Secondary | ICD-10-CM | POA: Diagnosis not present

## 2018-11-20 DIAGNOSIS — Z170 Estrogen receptor positive status [ER+]: Secondary | ICD-10-CM | POA: Diagnosis not present

## 2018-11-20 DIAGNOSIS — C50411 Malignant neoplasm of upper-outer quadrant of right female breast: Secondary | ICD-10-CM | POA: Diagnosis not present

## 2018-11-23 ENCOUNTER — Ambulatory Visit
Admission: RE | Admit: 2018-11-23 | Discharge: 2018-11-23 | Disposition: A | Discharge status: Home / Self Care | Payer: 59 | Source: Ambulatory Visit | Attending: Radiation Oncology | Admitting: Radiation Oncology

## 2018-11-23 ENCOUNTER — Ambulatory Visit: Payer: 59

## 2018-11-23 DIAGNOSIS — C50411 Malignant neoplasm of upper-outer quadrant of right female breast: Secondary | ICD-10-CM | POA: Diagnosis not present

## 2018-11-23 DIAGNOSIS — Z510 Encounter for antineoplastic radiation therapy: Secondary | ICD-10-CM | POA: Diagnosis not present

## 2018-11-23 DIAGNOSIS — Z170 Estrogen receptor positive status [ER+]: Secondary | ICD-10-CM | POA: Diagnosis not present

## 2018-11-24 ENCOUNTER — Other Ambulatory Visit: Payer: Self-pay | Admitting: Hematology and Oncology

## 2018-11-24 ENCOUNTER — Ambulatory Visit
Admission: RE | Admit: 2018-11-24 | Discharge: 2018-11-24 | Disposition: A | Discharge status: Home / Self Care | Payer: 59 | Source: Ambulatory Visit | Attending: Urgent Care | Admitting: Urgent Care

## 2018-11-24 DIAGNOSIS — G620 Drug-induced polyneuropathy: Secondary | ICD-10-CM | POA: Insufficient documentation

## 2018-11-24 DIAGNOSIS — Z13820 Encounter for screening for osteoporosis: Secondary | ICD-10-CM | POA: Diagnosis not present

## 2018-11-24 DIAGNOSIS — T451X5A Adverse effect of antineoplastic and immunosuppressive drugs, initial encounter: Secondary | ICD-10-CM | POA: Insufficient documentation

## 2018-11-24 HISTORY — DX: Personal history of irradiation: Z92.3

## 2018-11-24 HISTORY — DX: Personal history of antineoplastic chemotherapy: Z92.21

## 2018-11-25 ENCOUNTER — Other Ambulatory Visit: Payer: Self-pay | Admitting: Urgent Care

## 2018-11-30 ENCOUNTER — Encounter: Payer: Self-pay | Admitting: *Deleted

## 2018-12-01 ENCOUNTER — Encounter: Payer: Self-pay | Admitting: *Deleted

## 2018-12-01 ENCOUNTER — Ambulatory Visit: Payer: 59 | Admitting: Anesthesiology

## 2018-12-01 ENCOUNTER — Ambulatory Visit
Admission: RE | Admit: 2018-12-01 | Discharge: 2018-12-01 | Disposition: A | Discharge status: Home / Self Care | Payer: 59 | Attending: Gastroenterology | Admitting: Gastroenterology

## 2018-12-01 ENCOUNTER — Encounter
Admission: RE | Disposition: A | Discharge status: Home / Self Care | Payer: Self-pay | Source: Home / Self Care | Attending: Gastroenterology

## 2018-12-01 DIAGNOSIS — Z87891 Personal history of nicotine dependence: Secondary | ICD-10-CM | POA: Diagnosis not present

## 2018-12-01 DIAGNOSIS — Z85828 Personal history of other malignant neoplasm of skin: Secondary | ICD-10-CM | POA: Diagnosis not present

## 2018-12-01 DIAGNOSIS — D123 Benign neoplasm of transverse colon: Secondary | ICD-10-CM

## 2018-12-01 DIAGNOSIS — K640 First degree hemorrhoids: Secondary | ICD-10-CM | POA: Diagnosis not present

## 2018-12-01 DIAGNOSIS — K5730 Diverticulosis of large intestine without perforation or abscess without bleeding: Secondary | ICD-10-CM | POA: Diagnosis not present

## 2018-12-01 DIAGNOSIS — D122 Benign neoplasm of ascending colon: Secondary | ICD-10-CM | POA: Diagnosis not present

## 2018-12-01 DIAGNOSIS — Z86000 Personal history of in-situ neoplasm of breast: Secondary | ICD-10-CM | POA: Insufficient documentation

## 2018-12-01 DIAGNOSIS — Z9221 Personal history of antineoplastic chemotherapy: Secondary | ICD-10-CM | POA: Insufficient documentation

## 2018-12-01 DIAGNOSIS — K635 Polyp of colon: Secondary | ICD-10-CM | POA: Insufficient documentation

## 2018-12-01 DIAGNOSIS — Z1211 Encounter for screening for malignant neoplasm of colon: Secondary | ICD-10-CM

## 2018-12-01 DIAGNOSIS — Z923 Personal history of irradiation: Secondary | ICD-10-CM | POA: Insufficient documentation

## 2018-12-01 DIAGNOSIS — Z7689 Persons encountering health services in other specified circumstances: Secondary | ICD-10-CM | POA: Diagnosis not present

## 2018-12-01 DIAGNOSIS — Z79899 Other long term (current) drug therapy: Secondary | ICD-10-CM | POA: Insufficient documentation

## 2018-12-01 DIAGNOSIS — Z853 Personal history of malignant neoplasm of breast: Secondary | ICD-10-CM | POA: Insufficient documentation

## 2018-12-01 HISTORY — PX: COLONOSCOPY WITH PROPOFOL: SHX5780

## 2018-12-01 SURGERY — COLONOSCOPY WITH PROPOFOL
Anesthesia: General

## 2018-12-01 MED ORDER — PROPOFOL 500 MG/50ML IV EMUL
INTRAVENOUS | Status: AC
  Filled 2018-12-01: qty 50

## 2018-12-01 MED ORDER — SODIUM CHLORIDE 0.9 % IV SOLN
INTRAVENOUS | Status: DC
  Administered 2018-12-01: 10:00:00 via INTRAVENOUS
  Administered 2018-12-01: 1000 mL via INTRAVENOUS

## 2018-12-01 MED ORDER — PROPOFOL 10 MG/ML IV BOLUS
INTRAVENOUS | Status: DC | PRN
  Administered 2018-12-01: 40 mg via INTRAVENOUS
  Administered 2018-12-01: 50 mg via INTRAVENOUS

## 2018-12-01 MED ORDER — PROPOFOL 500 MG/50ML IV EMUL
INTRAVENOUS | Status: DC | PRN
  Administered 2018-12-01: 80 ug/kg/min via INTRAVENOUS

## 2018-12-01 NOTE — Anesthesia Post-op Follow-up Note (Signed)
Anesthesia QCDR form completed.        

## 2018-12-01 NOTE — Anesthesia Postprocedure Evaluation (Signed)
Anesthesia Post Note  Patient: Hulda Reddix  Procedure(s) Performed: COLONOSCOPY WITH PROPOFOL (N/A )  Patient location during evaluation: PACU Anesthesia Type: General Level of consciousness: awake and alert Pain management: pain level controlled Vital Signs Assessment: post-procedure vital signs reviewed and stable Respiratory status: spontaneous breathing, nonlabored ventilation, respiratory function stable and patient connected to nasal cannula oxygen Cardiovascular status: blood pressure returned to baseline and stable Postop Assessment: no apparent nausea or vomiting Anesthetic complications: no     Last Vitals:  Vitals:   12/01/18 1052 12/01/18 1057  BP: 118/72 105/65  Pulse: 68   Resp: 18   Temp: (!) 36.2 C   SpO2: 99%     Last Pain:  Vitals:   12/01/18 1057  TempSrc:   PainSc: 0-No pain                 Molli Barrows

## 2018-12-01 NOTE — Op Note (Signed)
[facility] Gastroenterology Patient Name: Megan Vega Procedure Date: 12/01/2018 9:42 AM MRN: [id number] Account #: [id number] Date of Birth: 05-Apr-1956 Admit Type: Outpatient Age: 63 Room: [facility] ENDO ROOM 4 Gender: Female Note Status: Finalized Procedure:            Colonoscopy Indications:          Screening for colorectal malignant neoplasm Providers:            Lucilla Lame MD, MD Medicines:            Propofol per Anesthesia Complications:        No immediate complications. Procedure:            Pre-Anesthesia Assessment:                       - Prior to the procedure, a History and Physical was                        performed, and patient medications and allergies were                        reviewed. The patient's tolerance of previous                        anesthesia was also reviewed. The risks and benefits of                        the procedure and the sedation options and risks were                        discussed with the patient. All questions were                        answered, and informed consent was obtained. Prior                        Anticoagulants: The patient has taken no previous                        anticoagulant or antiplatelet agents. ASA Grade                        Assessment: II - A patient with mild systemic disease.                        After reviewing the risks and benefits, the patient was                        deemed in satisfactory condition to undergo the                        procedure.                       After obtaining informed consent, the colonoscope was                        passed under direct vision. Throughout the procedure,                        the patient's blood pressure,  pulse, and oxygen                        saturations were monitored continuously. The                        Colonoscope was introduced through the anus and                        advanced to the the cecum, identified by  appendiceal                        orifice and ileocecal valve. The colonoscopy was                        performed without difficulty. The patient tolerated the                        procedure well. The quality of the bowel preparation                        was excellent. Findings:      The perianal and digital rectal examinations were normal.      A 2 mm polyp was found in the ascending colon. The polyp was sessile.       The polyp was removed with a cold biopsy forceps. Resection and       retrieval were complete.      A 4 mm polyp was found in the transverse colon. The polyp was sessile.       The polyp was removed with a cold biopsy forceps. Resection and       retrieval were complete.      A few small-mouthed diverticula were found in the entire colon.      Non-bleeding internal hemorrhoids were found during retroflexion. The       hemorrhoids were Grade I (internal hemorrhoids that do not prolapse). Impression:           - One 2 mm polyp in the ascending colon, removed with a                        cold biopsy forceps. Resected and retrieved.                       - One 4 mm polyp in the transverse colon, removed with                        a cold biopsy forceps. Resected and retrieved.                       - Diverticulosis in the entire examined colon.                       - Non-bleeding internal hemorrhoids. Recommendation:       - Discharge patient to home.                       - Resume previous diet.                       - Continue present medications.                       -  Await pathology results.                       - Repeat colonoscopy in 5 years for surveillance. Procedure Code(s):    --- Professional ---                       [phone], Colonoscopy, flexible; with biopsy, single or                        multiple Diagnosis Code(s):    --- Professional ---                       Z12.11, Encounter for screening for malignant neoplasm                        of  colon                       D12.2, Benign neoplasm of ascending colon                       D12.3, Benign neoplasm of transverse colon (hepatic                        flexure or splenic flexure) CPT copyright 2018 American Medical Association. All rights reserved. The codes documented in this report are preliminary and upon coder review may  be revised to meet current compliance requirements. Lucilla Lame MD, MD 12/01/2018 10:48:19 AM This report has been signed electronically. Number of Addenda: 0 Note Initiated On: 12/01/2018 9:42 AM Scope Withdrawal Time: 0 hours 9 minutes 20 seconds  Total Procedure Duration: 0 hours 13 minutes 58 seconds       [facility]

## 2018-12-01 NOTE — Transfer of Care (Signed)
Immediate Anesthesia Transfer of Care Note  Patient: Megan Vega  Procedure(s) Performed: COLONOSCOPY WITH PROPOFOL (N/A )  Patient Location: PACU  Anesthesia Type:General  Level of Consciousness: awake  Airway & Oxygen Therapy: Patient Spontanous Breathing  Post-op Assessment: Report given to RN  Post vital signs: stable  Last Vitals:  Vitals Value Taken Time  BP    Temp    Pulse    Resp    SpO2      Last Pain:  Vitals:   12/01/18 1007  TempSrc: Tympanic  PainSc: 0-No pain         Complications: No apparent anesthesia complications

## 2018-12-01 NOTE — H&P (Signed)
Megan Lame, MD [address] [place], [place] [address] Phone: [phone] Fax : [phone]  Primary Care Physician:  Burnard Hawthorne, FNP Primary Gastroenterologist:  Dr. Allen Norris  Pre-Procedure History & Physical: HPI:  Megan Vega is a 63 y.o. female is here for a screening colonoscopy.   Past Medical History:  Diagnosis Date  . Ankle fracture   . Cancer [facility])    Basal Cell Carcinoma, about 2011 chest  . Cataract   . Family history of adverse reaction to anesthesia    DAUGHTER-N/V  . Hemorrhoids   . History of methicillin resistant staphylococcus aureus (MRSA) 2015  . Migraines    MIGRAINES  . Personal history of chemotherapy   . Personal history of radiation therapy     Past Surgical History:  Procedure Laterality Date  . BREAST BIOPSY Bilateral 10/29/2017   L breast DCIS, R breast invasive mammary carcinoma of no special type, ER/PR+, Her2/neu 1+  . CESAREAN SECTION    . COLONOSCOPY  2012  . FRACTURE SURGERY    . IRRIGATION AND DEBRIDEMENT ABSCESS Right 03/12/2018   Procedure: IRRIGATION AND DEBRIDEMENT RIGHT AXILLARY ABSCESS;  Surgeon: Robert Bellow, MD;  Location: [facility] ORS;  Service: General;  Laterality: Right;  . MANDIBLE FRACTURE SURGERY    . MASTECTOMY    . MASTECTOMY MODIFIED RADICAL Right 07/10/2018   ypT2 ypN2a ER/PR positive, HER-2/neu negative  Surgeon: Robert Bellow, MD;  Location: [facility] ORS;  Service: General;  Laterality: Right;  . PORTACATH PLACEMENT Left 11/28/2017   Procedure: INSERTION PORT-A-CATH;  Surgeon: Robert Bellow, MD;  Location: [facility] ORS;  Service: General;  Laterality: Left;  . SIMPLE MASTECTOMY WITH AXILLARY SENTINEL NODE BIOPSY Left 07/10/2018   High-grade DCIS SIMPLE MASTECTOMY;  Surgeon: Robert Bellow, MD;  Location: [facility] ORS;  Service: General;  Laterality: Left;    Prior to Admission medications   Medication Sig Start Date End Date Taking? Authorizing Provider  acetaminophen (TYLENOL) 500 MG  tablet Take 1,000 mg by mouth every 6 (six) hours as needed for moderate pain or headache.    [provider]  aspirin-acetaminophen-caffeine (EXCEDRIN MIGRAINE) [phone] MG tablet Take 1 tablet by mouth every 6 (six) hours as needed for headache or migraine.    [provider]  b complex vitamins tablet Take 1 tablet by mouth daily.    [provider]  CALCIUM PO Take 1 tablet by mouth daily.     [provider]  gabapentin (NEURONTIN) 100 MG capsule Take 2 capsules (200 mg total) by mouth at bedtime. 08/24/18   Karen Kitchens, NP  Multiple Vitamin (MULTIVITAMIN WITH MINERALS) TABS tablet Take 1 tablet by mouth daily.    [provider]  mupirocin ointment (BACTROBAN) 2 % Apply 1 application topically 2 (two) times daily. X 5 days to nose and bid x 7-14 days right leg 09/10/18   McLean-Scocuzza, Nino Glow, MD  polyethylene glycol (MIRALAX / GLYCOLAX) packet Take 17 g by mouth daily as needed (for constipation.).     [provider]  Probiotic Product (PROBIOTIC PO) Take 1 capsule by mouth daily.    [provider]  tetrahydrozoline 0.05 % ophthalmic solution Place 1 drop into both eyes 3 (three) times daily as needed (for dry/red eyes.).    [provider]    Allergies as of 10/30/2018 - Review Complete 10/14/2018  Allergen Reaction Noted  . Peanut-containing drug products Swelling and Other (See Comments) 11/14/2017    Family History  Problem Relation Age of Onset  . Cancer Mother        Esophageal Cancer  . Early death Mother        Age 70  . Cancer Father        Lung Cancer  . Diabetes Father   . Diabetes Brother        Controlled with diet  . Heart attack Paternal Grandfather   . Colon cancer Neg Hx     Social History   Socioeconomic History  . Marital status: Divorced    Spouse name: Not on file  . Number of children: Not on file  . Years of education: Not on file  . Highest education level: Not on file   Occupational History  . Not on file  Social Needs  . Financial resource strain: Not on file  . Food insecurity:    Worry: Not on file    Inability: Not on file  . Transportation needs:    Medical: Not on file    Non-medical: Not on file  Tobacco Use  . Smoking status: Former Smoker    Packs/day: 0.25    Years: 10.00    Pack years: 2.50    Last attempt to quit: 1978    Years since quitting: 42.0  . Smokeless tobacco: Never Used  Substance and Sexual Activity  . Alcohol use: Yes    Alcohol/week: 1.0 standard drinks    Types: 1 Glasses of wine per week    Comment: RARE  . Drug use: No  . Sexual activity: Not Currently  Lifestyle  . Physical activity:    Days per week: Not on file    Minutes per session: Not on file  . Stress: Not on file  Relationships  . Social connections:    Talks on phone: Not on file    Gets together: Not on file    Attends religious service: Not on file    Active member of club or organization: Not on file    Attends meetings of clubs or organizations: Not on file    Relationship status: Not on file  . Intimate partner violence:    Fear of current or ex partner: Not on file    Emotionally abused: Not on file    Physically abused: Not on file    Forced sexual activity: Not on file  Other Topics Concern  . 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    Lives at home (son 16 yo lives with her)   Children 2   Pets: 3 small dogs and 1 frog    Caffeine- 1 cup of coffee in the morning, rare tea    Review of Systems: See HPI, otherwise negative ROS  Physical Exam: BP 121/82   Pulse 86   Temp (!) 97.1 F (36.2 C) (Tympanic)   Resp 16   Ht 5' 4"  (1.626 m)   Wt 64.9 kg   SpO2 98%   BMI 24.55 kg/m  General:   Alert,  pleasant and cooperative in NAD Head:  Normocephalic and atraumatic. Neck:  Supple; no masses or thyromegaly. Lungs:  Clear throughout to auscultation.    Heart:  Regular rate and  rhythm. Abdomen:  Soft, nontender and nondistended. Normal bowel sounds, without guarding, and without rebound.   Neurologic:  Alert and  oriented x4;  grossly normal neurologically.  Impression/Plan: Megan Vega is now here to undergo a screening colonoscopy.  Risks, benefits,  and alternatives regarding colonoscopy have been reviewed with the patient.  Questions have been answered.  All parties agreeable.

## 2018-12-01 NOTE — Anesthesia Preprocedure Evaluation (Signed)
Anesthesia Evaluation  Patient identified by MRN, date of birth, ID band Patient awake    Reviewed: Allergy & Precautions, H&P , NPO status , Patient's Chart, lab work & pertinent test results, reviewed documented beta blocker date and time   Airway Mallampati: II   Neck ROM: full    Dental  (+) Poor Dentition   Pulmonary neg pulmonary ROS, former smoker,    Pulmonary exam normal        Cardiovascular Exercise Tolerance: Good negative cardio ROS Normal cardiovascular exam Rhythm:regular Rate:Normal     Neuro/Psych  Headaches,  Neuromuscular disease negative neurological ROS  negative psych ROS   GI/Hepatic negative GI ROS, Neg liver ROS,   Endo/Other  negative endocrine ROS  Renal/GU negative Renal ROS  negative genitourinary   Musculoskeletal   Abdominal   Peds  Hematology negative hematology ROS (+)   Anesthesia Other Findings Past Medical History: No date: Ankle fracture No date: Cancer [facility])     Comment:  Basal Cell Carcinoma, about 2011 chest No date: Cataract No date: Family history of adverse reaction to anesthesia     Comment:  DAUGHTER-N/V No date: Hemorrhoids 2015: History of methicillin resistant staphylococcus aureus (MRSA) No date: Migraines     Comment:  MIGRAINES No date: Personal history of chemotherapy No date: Personal history of radiation therapy Past Surgical History: 10/29/2017: BREAST BIOPSY; Bilateral     Comment:  L breast DCIS, R breast invasive mammary carcinoma of no              special type, ER/PR+, Her2/neu 1+ No date: CESAREAN SECTION 2012: COLONOSCOPY No date: FRACTURE SURGERY 03/12/2018: IRRIGATION AND DEBRIDEMENT ABSCESS; Right     Comment:  Procedure: IRRIGATION AND DEBRIDEMENT RIGHT AXILLARY               ABSCESS;  Surgeon: Robert Bellow, MD;  Location:               [facility] ORS;  Service: General;  Laterality: Right; No date: MANDIBLE FRACTURE SURGERY No date:  MASTECTOMY 07/10/2018: MASTECTOMY MODIFIED RADICAL; Right     Comment:  ypT2 ypN2a ER/PR positive, HER-2/neu negative  Surgeon:               Robert Bellow, MD;  Location: [facility] ORS;  Service:               General;  Laterality: Right; 11/28/2017: PORTACATH PLACEMENT; Left     Comment:  Procedure: INSERTION PORT-A-CATH;  Surgeon: Robert Bellow, MD;  Location: [facility] ORS;  Service: General;                Laterality: Left; 07/10/2018: SIMPLE MASTECTOMY WITH AXILLARY SENTINEL NODE BIOPSY; Left     Comment:  High-grade DCIS SIMPLE MASTECTOMY;  Surgeon: Robert Bellow, MD;  Location: [facility] ORS;  Service: General;                Laterality: Left; BMI    Body Mass Index:  24.55 kg/m     Reproductive/Obstetrics negative OB ROS                             Anesthesia Physical Anesthesia Plan  ASA: II  Anesthesia Plan: General   Post-op Pain Management:    Induction:  PONV Risk Score and Plan:   Airway Management Planned:   Additional Equipment:   Intra-op Plan:   Post-operative Plan:   Informed Consent: I have reviewed the patients History and Physical, chart, labs and discussed the procedure including the risks, benefits and alternatives for the proposed anesthesia with the patient or authorized representative who has indicated his/her understanding and acceptance.     Dental Advisory Given  Plan Discussed with: CRNA  Anesthesia Plan Comments:         Anesthesia Quick Evaluation

## 2018-12-02 ENCOUNTER — Encounter: Payer: Self-pay | Admitting: Gastroenterology

## 2018-12-03 ENCOUNTER — Encounter: Payer: Self-pay | Admitting: Gastroenterology

## 2018-12-03 LAB — SURGICAL PATHOLOGY

## 2018-12-04 ENCOUNTER — Telehealth: Payer: Self-pay | Admitting: *Deleted

## 2018-12-04 NOTE — Telephone Encounter (Signed)
Copied from [place] [id number]. Topic: General - Other >> Dec 04, 2018  2:49 PM Megan Vega A wrote: Reason for CRM: Patient called to leave information for [facility]. Patient stated that she is to return in 10 years for her next Colonoscopy.

## 2018-12-07 NOTE — Telephone Encounter (Signed)
Noted in chart.

## 2018-12-09 ENCOUNTER — Other Ambulatory Visit: Payer: Self-pay | Admitting: Hematology and Oncology

## 2018-12-28 ENCOUNTER — Inpatient Hospital Stay: Payer: 59 | Attending: Radiation Oncology

## 2018-12-28 ENCOUNTER — Encounter: Payer: Self-pay | Admitting: Radiation Oncology

## 2018-12-28 ENCOUNTER — Ambulatory Visit
Admission: RE | Admit: 2018-12-28 | Discharge: 2018-12-28 | Disposition: A | Discharge status: Home / Self Care | Payer: 59 | Source: Ambulatory Visit | Attending: Radiation Oncology | Admitting: Radiation Oncology

## 2018-12-28 ENCOUNTER — Other Ambulatory Visit: Payer: Self-pay

## 2018-12-28 ENCOUNTER — Inpatient Hospital Stay: Payer: 59

## 2018-12-28 VITALS — BP 122/75 | HR 78 | Temp 97.8°F | Resp 18 | Wt 148.9 lb

## 2018-12-28 DIAGNOSIS — C50411 Malignant neoplasm of upper-outer quadrant of right female breast: Secondary | ICD-10-CM

## 2018-12-28 DIAGNOSIS — Z170 Estrogen receptor positive status [ER+]: Secondary | ICD-10-CM

## 2018-12-28 NOTE — Progress Notes (Unsigned)
Survivorship Care Plan visit completed.  Treatment summary reviewed and given to patient.  ASCO answers booklet reviewed and given to patient.  CARE program and Cancer Transitions discussed with patient along with other resources cancer center offers to patients and caregivers.  Patient verbalized understanding.    

## 2018-12-28 NOTE — Progress Notes (Signed)
Radiation Oncology Follow up Note  Name: Megan Vega   Date:   12/28/2018 MRN:  [id number] DOB: 04-Jun-1956    This 63 y.o. female presents to the clinic today for one-month follow-upstatus postradiation therapy to her right chest wall peripheral lymphatics for stage IIIB invasive mammary carcinoma ER/PR positive HER-2/neu negative in patient status post bilateral mastectomies.  REFERRING PROVIDER: Burnard Hawthorne, FNP  HPI: patient is a 63 year old female now out 1 month having completed right chest wall and peripheral lymphatic radiation for stage IIIB invasive mammary carcinoma ER/PR positive HER-2/neu not overexpressed status post bilateral mastectomies seen. Seen today in routine follow-up she is doing well specifically denies any chest wall discomfort cough or bone pain. She has not started antiestrogen therapy at this time..  COMPLICATIONS OF TREATMENT: none  FOLLOW UP COMPLIANCE: keeps appointments   PHYSICAL EXAM:  BP 122/75   Pulse 78   Temp 97.8 F (36.6 C)   Resp 18   Wt 148 lb 14.7 oz (67.5 kg)   BMI 25.56 kg/m  Patient is status post bilateral mastectomies no chest wall mass or nodularity is noted bilaterally. No axillary or supraclavicular adenopathy is identified. No evidence of lymphedema in her right right upper extremity is noted.Well-developed well-nourished patient in NAD. HEENT reveals PERLA, EOMI, discs not visualized.  Oral cavity is clear. No oral mucosal lesions are identified. Neck is clear without evidence of cervical or supraclavicular adenopathy. Lungs are clear to A&P. Cardiac examination is essentially unremarkable with regular rate and rhythm without murmur rub or thrill. Abdomen is benign with no organomegaly or masses noted. Motor sensory and DTR levels are equal and symmetric in the upper and lower extremities. Cranial nerves II through XII are grossly intact. Proprioception is intact. No peripheral adenopathy or edema is identified. No motor or  sensory levels are noted. Crude visual fields are within normal range.  RADIOLOGY RESULTS: no current films for review  PLAN: present time patient is doing well 1 month out from radiation. I'm setting her up for follow-up with Dr. Mike Gip to discuss antiestrogen therapy. Otherwise I've asked to see her back in 4-5 months for follow-up. Patient is to call at anytime with any concerns.  I would like to take this opportunity to thank you for allowing me to participate in the care of your patient.Noreene Filbert, MD

## 2018-12-31 ENCOUNTER — Inpatient Hospital Stay: Payer: 59 | Attending: Radiation Oncology

## 2018-12-31 DIAGNOSIS — Z9013 Acquired absence of bilateral breasts and nipples: Secondary | ICD-10-CM | POA: Diagnosis not present

## 2018-12-31 DIAGNOSIS — T451X5A Adverse effect of antineoplastic and immunosuppressive drugs, initial encounter: Secondary | ICD-10-CM | POA: Diagnosis not present

## 2018-12-31 DIAGNOSIS — Z170 Estrogen receptor positive status [ER+]: Secondary | ICD-10-CM | POA: Diagnosis not present

## 2018-12-31 DIAGNOSIS — K648 Other hemorrhoids: Secondary | ICD-10-CM | POA: Insufficient documentation

## 2018-12-31 DIAGNOSIS — M8580 Other specified disorders of bone density and structure, unspecified site: Secondary | ICD-10-CM | POA: Diagnosis not present

## 2018-12-31 DIAGNOSIS — Z79811 Long term (current) use of aromatase inhibitors: Secondary | ICD-10-CM | POA: Insufficient documentation

## 2018-12-31 DIAGNOSIS — Z86010 Personal history of colonic polyps: Secondary | ICD-10-CM | POA: Diagnosis not present

## 2018-12-31 DIAGNOSIS — Z95828 Presence of other vascular implants and grafts: Secondary | ICD-10-CM

## 2018-12-31 DIAGNOSIS — Z79899 Other long term (current) drug therapy: Secondary | ICD-10-CM | POA: Insufficient documentation

## 2018-12-31 DIAGNOSIS — Z85828 Personal history of other malignant neoplasm of skin: Secondary | ICD-10-CM | POA: Insufficient documentation

## 2018-12-31 DIAGNOSIS — C50411 Malignant neoplasm of upper-outer quadrant of right female breast: Secondary | ICD-10-CM | POA: Diagnosis present

## 2018-12-31 DIAGNOSIS — G620 Drug-induced polyneuropathy: Secondary | ICD-10-CM | POA: Diagnosis not present

## 2018-12-31 DIAGNOSIS — Z800 Family history of malignant neoplasm of digestive organs: Secondary | ICD-10-CM | POA: Diagnosis not present

## 2018-12-31 DIAGNOSIS — Z87891 Personal history of nicotine dependence: Secondary | ICD-10-CM | POA: Diagnosis not present

## 2018-12-31 DIAGNOSIS — Z9221 Personal history of antineoplastic chemotherapy: Secondary | ICD-10-CM | POA: Diagnosis not present

## 2018-12-31 DIAGNOSIS — D0512 Intraductal carcinoma in situ of left breast: Secondary | ICD-10-CM | POA: Insufficient documentation

## 2018-12-31 DIAGNOSIS — Z923 Personal history of irradiation: Secondary | ICD-10-CM | POA: Insufficient documentation

## 2018-12-31 DIAGNOSIS — Z801 Family history of malignant neoplasm of trachea, bronchus and lung: Secondary | ICD-10-CM | POA: Insufficient documentation

## 2018-12-31 MED ORDER — HEPARIN SOD (PORK) LOCK FLUSH 100 UNIT/ML IV SOLN
500.0000 [IU] | Freq: Once | INTRAVENOUS | Status: AC
  Administered 2018-12-31: 500 [IU] via INTRAVENOUS
  Filled 2018-12-31: qty 5

## 2018-12-31 MED ORDER — SODIUM CHLORIDE 0.9% FLUSH
10.0000 mL | Freq: Once | INTRAVENOUS | Status: AC
  Administered 2018-12-31: 10 mL via INTRAVENOUS
  Filled 2018-12-31: qty 10

## 2019-01-06 ENCOUNTER — Inpatient Hospital Stay (HOSPITAL_BASED_OUTPATIENT_CLINIC_OR_DEPARTMENT_OTHER): Payer: 59 | Admitting: Hematology and Oncology

## 2019-01-06 ENCOUNTER — Encounter: Payer: Self-pay | Admitting: Hematology and Oncology

## 2019-01-06 ENCOUNTER — Ambulatory Visit: Payer: 59

## 2019-01-06 ENCOUNTER — Inpatient Hospital Stay: Payer: 59

## 2019-01-06 VITALS — BP 123/82 | HR 76 | Temp 97.8°F | Resp 16 | Wt 148.6 lb

## 2019-01-06 DIAGNOSIS — Z923 Personal history of irradiation: Secondary | ICD-10-CM

## 2019-01-06 DIAGNOSIS — C50411 Malignant neoplasm of upper-outer quadrant of right female breast: Secondary | ICD-10-CM

## 2019-01-06 DIAGNOSIS — D0512 Intraductal carcinoma in situ of left breast: Secondary | ICD-10-CM

## 2019-01-06 DIAGNOSIS — Z85828 Personal history of other malignant neoplasm of skin: Secondary | ICD-10-CM

## 2019-01-06 DIAGNOSIS — Z801 Family history of malignant neoplasm of trachea, bronchus and lung: Secondary | ICD-10-CM

## 2019-01-06 DIAGNOSIS — Z9221 Personal history of antineoplastic chemotherapy: Secondary | ICD-10-CM

## 2019-01-06 DIAGNOSIS — Z86010 Personal history of colonic polyps: Secondary | ICD-10-CM

## 2019-01-06 DIAGNOSIS — Z170 Estrogen receptor positive status [ER+]: Secondary | ICD-10-CM

## 2019-01-06 DIAGNOSIS — M85852 Other specified disorders of bone density and structure, left thigh: Secondary | ICD-10-CM

## 2019-01-06 DIAGNOSIS — K648 Other hemorrhoids: Secondary | ICD-10-CM

## 2019-01-06 DIAGNOSIS — Z800 Family history of malignant neoplasm of digestive organs: Secondary | ICD-10-CM

## 2019-01-06 DIAGNOSIS — G620 Drug-induced polyneuropathy: Secondary | ICD-10-CM

## 2019-01-06 DIAGNOSIS — Z9013 Acquired absence of bilateral breasts and nipples: Secondary | ICD-10-CM

## 2019-01-06 DIAGNOSIS — Z87891 Personal history of nicotine dependence: Secondary | ICD-10-CM

## 2019-01-06 DIAGNOSIS — Z79811 Long term (current) use of aromatase inhibitors: Secondary | ICD-10-CM | POA: Diagnosis not present

## 2019-01-06 DIAGNOSIS — M8580 Other specified disorders of bone density and structure, unspecified site: Secondary | ICD-10-CM

## 2019-01-06 DIAGNOSIS — Z79899 Other long term (current) drug therapy: Secondary | ICD-10-CM

## 2019-01-06 DIAGNOSIS — T451X5A Adverse effect of antineoplastic and immunosuppressive drugs, initial encounter: Secondary | ICD-10-CM

## 2019-01-06 DIAGNOSIS — Z7189 Other specified counseling: Secondary | ICD-10-CM

## 2019-01-06 LAB — COMPREHENSIVE METABOLIC PANEL
ALT: 17 U/L (ref 0–44)
AST: 27 U/L (ref 15–41)
Albumin: 4.4 g/dL (ref 3.5–5.0)
Alkaline Phosphatase: 75 U/L (ref 38–126)
Anion gap: 8 (ref 5–15)
BUN: 12 mg/dL (ref 8–23)
CO2: 27 mmol/L (ref 22–32)
Calcium: 9.6 mg/dL (ref 8.9–10.3)
Chloride: 103 mmol/L (ref 98–111)
Creatinine, Ser: 0.83 mg/dL (ref 0.44–1.00)
GFR calc Af Amer: >60 mL/min (ref >60)
GFR calc non Af Amer: >60 mL/min (ref >60)
Glucose, Bld: 96 mg/dL (ref 70–99)
Potassium: 3.7 mmol/L (ref 3.5–5.1)
Sodium: 138 mmol/L (ref 135–145)
TOTAL PROTEIN: 7.2 g/dL (ref 6.5–8.1)
Total Bilirubin: 1 mg/dL (ref 0.3–1.2)

## 2019-01-06 LAB — CBC WITH DIFFERENTIAL/PLATELET
ABS IMMATURE GRANULOCYTES: 0 10*3/uL (ref 0.00–0.07)
Basophils Absolute: 0 10*3/uL (ref 0.0–0.1)
Basophils Relative: 1 %
Eosinophils Absolute: 0.1 10*3/uL (ref 0.0–0.5)
Eosinophils Relative: 2 %
HCT: 42.7 % (ref 36.0–46.0)
Hemoglobin: 14.6 g/dL (ref 12.0–15.0)
Immature Granulocytes: 0 %
Lymphocytes Relative: 24 %
Lymphs Abs: 0.8 10*3/uL (ref 0.7–4.0)
MCH: 29.9 pg (ref 26.0–34.0)
MCHC: 34.2 g/dL (ref 30.0–36.0)
MCV: 87.3 fL (ref 80.0–100.0)
Monocytes Absolute: 0.4 10*3/uL (ref 0.1–1.0)
Monocytes Relative: 12 %
NEUTROS ABS: 2.1 10*3/uL (ref 1.7–7.7)
Neutrophils Relative %: 61 %
Platelets: 194 10*3/uL (ref 150–400)
RBC: 4.89 MIL/uL (ref 3.87–5.11)
RDW: 12.7 % (ref 11.5–15.5)
WBC: 3.5 10*3/uL — AB (ref 4.0–10.5)
nRBC: 0 % (ref 0.0–0.2)

## 2019-01-06 MED ORDER — LETROZOLE 2.5 MG PO TABS: 2.5000 mg | ORAL_TABLET | Freq: Every day | ORAL | 0 refills | Status: DC

## 2019-01-06 NOTE — Patient Instructions (Addendum)
Letrozole tablets What is this medicine? LETROZOLE (LET roe zole) blocks the production of estrogen. It is used to treat breast cancer. This medicine may be used for other purposes; ask your health care provider or pharmacist if you have questions. COMMON BRAND NAME(S): Femara What should I tell my health care provider before I take this medicine? They need to know if you have any of these conditions: -high cholesterol -liver disease -osteoporosis (weak bones) -an unusual or allergic reaction to letrozole, other medicines, foods, dyes, or preservatives -pregnant or trying to get pregnant -breast-feeding How should I use this medicine? Take this medicine by mouth with a glass of water. You may take it with or without food. Follow the directions on the prescription label. Take your medicine at regular intervals. Do not take your medicine more often than directed. Do not stop taking except on your doctor's advice. Talk to your pediatrician regarding the use of this medicine in children. Special care may be needed. Overdosage: If you think you have taken too much of this medicine contact a poison control center or emergency room at once. NOTE: This medicine is only for you. Do not share this medicine with others. What if I miss a dose? If you miss a dose, take it as soon as you can. If it is almost time for your next dose, take only that dose. Do not take double or extra doses. What may interact with this medicine? Do not take this medicine with any of the following medications: -estrogens, like hormone replacement therapy or birth control pills This medicine may also interact with the following medications: -dietary supplements such as androstenedione or DHEA -prasterone -tamoxifen This list may not describe all possible interactions. Give your health care provider a list of all the medicines, herbs, non-prescription drugs, or dietary supplements you use. Also tell them if you smoke, drink  alcohol, or use illegal drugs. Some items may interact with your medicine. What should I watch for while using this medicine? Tell your doctor or healthcare professional if your symptoms do not start to get better or if they get worse. Do not become pregnant while taking this medicine or for 3 weeks after stopping it. Women should inform their doctor if they wish to become pregnant or think they might be pregnant. There is a potential for serious side effects to an unborn child. Talk to your health care professional or pharmacist for more information. Do not breast-feed while taking this medicine or for 3 weeks after stopping it. This medicine may interfere with the ability to have a child. Talk with your doctor or health care professional if you are concerned about your fertility. Using this medicine for a long time may increase your risk of low bone mass. Talk to your doctor about bone health. You may get drowsy or dizzy. Do not drive, use machinery, or do anything that needs mental alertness until you know how this medicine affects you. Do not stand or sit up quickly, especially if you are an older patient. This reduces the risk of dizzy or fainting spells. You may need blood work done while you are taking this medicine. What side effects may I notice from receiving this medicine? Side effects that you should report to your doctor or health care professional as soon as possible: -allergic reactions like skin rash, itching, or hives -bone fracture -chest pain -signs and symptoms of a blood clot such as breathing problems; changes in vision; chest pain; severe, sudden headache; pain, swelling,   warmth in the leg; trouble speaking; sudden numbness or weakness of the face, arm or leg -vaginal bleeding Side effects that usually do not require medical attention (report to your doctor or health care professional if they continue or are bothersome): -bone, back, joint, or muscle  pain -dizziness -fatigue -fluid retention -headache -hot flashes, night sweats -nausea -weight gain This list may not describe all possible side effects. Call your doctor for medical advice about side effects. You may report side effects to FDA at 1-800-FDA-1088. Where should I keep my medicine? Keep out of the reach of children. Store between 15 and 30 degrees C (59 and 86 degrees F). Throw away any unused medicine after the expiration date. NOTE: This sheet is a summary. It may not cover all possible information. If you have questions about this medicine, talk to your doctor, pharmacist, or health care provider.  2019 Elsevier/Gold Standard (2016-06-10 11:10:41) Tamoxifen oral tablet What is this medicine? TAMOXIFEN (ta MOX i fen) blocks the effects of estrogen. It is commonly used to treat breast cancer. It is also used to decrease the chance of breast cancer coming back in women who have received treatment for the disease. It may also help prevent breast cancer in women who have a high risk of developing breast cancer. This medicine may be used for other purposes; ask your health care provider or pharmacist if you have questions. COMMON BRAND NAME(S): Nolvadex What should I tell my health care provider before I take this medicine? They need to know if you have any of these conditions: -blood clots -blood disease -cataracts or impaired eyesight -endometriosis -high calcium levels -high cholesterol -irregular menstrual cycles -liver disease -stroke -uterine fibroids -an unusual reaction to tamoxifen, other medicines, foods, dyes, or preservatives -pregnant or trying to get pregnant -breast-feeding How should I use this medicine? Take this medicine by mouth with a glass of water. Follow the directions on the prescription label. You can take it with or without food. Take your medicine at regular intervals. Do not take your medicine more often than directed. Do not stop taking  except on your doctor's advice. A special MedGuide will be given to you by the pharmacist with each prescription and refill. Be sure to read this information carefully each time. Talk to your pediatrician regarding the use of this medicine in children. While this drug may be prescribed for selected conditions, precautions do apply. Overdosage: If you think you have taken too much of this medicine contact a poison control center or emergency room at once. NOTE: This medicine is only for you. Do not share this medicine with others. What if I miss a dose? If you miss a dose, take it as soon as you can. If it is almost time for your next dose, take only that dose. Do not take double or extra doses. What may interact with this medicine? Do not take this medicine with any of the following medications: -cisapride -certain medicines for irregular heart beat like dofetilide, dronedarone, quinidine -certain medicines for fungal infection like fluconazole, posaconazole -pimozide -saquinavir -thioridazine This medicine may also interact with the following medications: -aminoglutethimide -anastrozole -bromocriptine -chemotherapy drugs -female hormones, like estrogens and birth control pills -letrozole -medroxyprogesterone -phenobarbital -rifampin -warfarin This list may not describe all possible interactions. Give your health care provider a list of all the medicines, herbs, non-prescription drugs, or dietary supplements you use. Also tell them if you smoke, drink alcohol, or use illegal drugs. Some items may interact with your medicine. What  should I watch for while using this medicine? Visit your doctor or health care professional for regular checks on your progress. You will need regular pelvic exams, breast exams, and mammograms. If you are taking this medicine to reduce your risk of getting breast cancer, you should know that this medicine does not prevent all types of breast cancer. If breast  cancer or other problems occur, there is no guarantee that it will be found at an early stage. Do not become pregnant while taking this medicine or for 2 months after stopping it. Women should inform their doctor if they wish to become pregnant or think they might be pregnant. There is a potential for serious side effects to an unborn child. Talk to your health care professional or pharmacist for more information. Do not breast-feed an infant while taking this medicine or for 3 months after stopping it. This medicine may interfere with the ability to have a child. Talk with your doctor or health care professional if you are concerned about your fertility. What side effects may I notice from receiving this medicine? Side effects that you should report to your doctor or health care professional as soon as possible: -allergic reactions like skin rash, itching or hives, swelling of the face, lips, or tongue -changes in vision -changes in your menstrual cycle -difficulty walking or talking -new breast lumps -numbness -pelvic pain or pressure -redness, blistering, peeling or loosening of the skin, including inside the mouth -signs and symptoms of a dangerous change in heartbeat or heart rhythm like chest pain, dizziness, fast or irregular heartbeat, palpitations, feeling faint or lightheaded, falls, breathing problems -sudden chest pain -swelling, pain or tenderness in your calf or leg -unusual bruising or bleeding -vaginal discharge that is bloody, brown, or rust -weakness -yellowing of the whites of the eyes or skin Side effects that usually do not require medical attention (report to your doctor or health care professional if they continue or are bothersome): -fatigue -hair loss, although uncommon and is usually mild -headache -hot flashes -impotence (in men) -nausea, vomiting (mild) -vaginal discharge (white or clear) This list may not describe all possible side effects. Call your doctor  for medical advice about side effects. You may report side effects to FDA at 1-800-FDA-1088. Where should I keep my medicine? Keep out of the reach of children. Store at room temperature between 20 and 25 degrees C (68 and 77 degrees F). Protect from light. Keep container tightly closed. Throw away any unused medicine after the expiration date. NOTE: This sheet is a summary. It may not cover all possible information. If you have questions about this medicine, talk to your doctor, pharmacist, or health care provider.  2019 Elsevier/Gold Standard (2018-02-25 16:49:57)  Zoledronic Acid injection (Hypercalcemia, Oncology) What is this medicine? ZOLEDRONIC ACID (ZOE le dron ik AS id) lowers the amount of calcium loss from bone. It is used to treat too much calcium in your blood from cancer. It is also used to prevent complications of cancer that has spread to the bone. This medicine may be used for other purposes; ask your health care provider or pharmacist if you have questions. COMMON BRAND NAME(S): Zometa What should I tell my health care provider before I take this medicine? They need to know if you have any of these conditions: -aspirin-sensitive asthma -cancer, especially if you are receiving medicines used to treat cancer -dental disease or wear dentures -infection -kidney disease -receiving corticosteroids like dexamethasone or prednisone -an unusual or allergic reaction to  zoledronic acid, other medicines, foods, dyes, or preservatives -pregnant or trying to get pregnant -breast-feeding How should I use this medicine? This medicine is for infusion into a vein. It is given by a health care professional in a hospital or clinic setting. Talk to your pediatrician regarding the use of this medicine in children. Special care may be needed. Overdosage: If you think you have taken too much of this medicine contact a poison control center or emergency room at once. NOTE: This medicine is only  for you. Do not share this medicine with others. What if I miss a dose? It is important not to miss your dose. Call your doctor or health care professional if you are unable to keep an appointment. What may interact with this medicine? -certain antibiotics given by injection -NSAIDs, medicines for pain and inflammation, like ibuprofen or naproxen -some diuretics like bumetanide, furosemide -teriparatide -thalidomide This list may not describe all possible interactions. Give your health care provider a list of all the medicines, herbs, non-prescription drugs, or dietary supplements you use. Also tell them if you smoke, drink alcohol, or use illegal drugs. Some items may interact with your medicine. What should I watch for while using this medicine? Visit your doctor or health care professional for regular checkups. It may be some time before you see the benefit from this medicine. Do not stop taking your medicine unless your doctor tells you to. Your doctor may order blood tests or other tests to see how you are doing. Women should inform their doctor if they wish to become pregnant or think they might be pregnant. There is a potential for serious side effects to an unborn child. Talk to your health care professional or pharmacist for more information. You should make sure that you get enough calcium and vitamin D while you are taking this medicine. Discuss the foods you eat and the vitamins you take with your health care professional. Some people who take this medicine have severe bone, joint, and/or muscle pain. This medicine may also increase your risk for jaw problems or a broken thigh bone. Tell your doctor right away if you have severe pain in your jaw, bones, joints, or muscles. Tell your doctor if you have any pain that does not go away or that gets worse. Tell your dentist and dental surgeon that you are taking this medicine. You should not have major dental surgery while on this medicine. See  your dentist to have a dental exam and fix any dental problems before starting this medicine. Take good care of your teeth while on this medicine. Make sure you see your dentist for regular follow-up appointments. What side effects may I notice from receiving this medicine? Side effects that you should report to your doctor or health care professional as soon as possible: -allergic reactions like skin rash, itching or hives, swelling of the face, lips, or tongue -anxiety, confusion, or depression -breathing problems -changes in vision -eye pain -feeling faint or lightheaded, falls -jaw pain, especially after dental work -mouth sores -muscle cramps, stiffness, or weakness -redness, blistering, peeling or loosening of the skin, including inside the mouth -trouble passing urine or change in the amount of urine Side effects that usually do not require medical attention (report to your doctor or health care professional if they continue or are bothersome): -bone, joint, or muscle pain -constipation -diarrhea -fever -hair loss -irritation at site where injected -loss of appetite -nausea, vomiting -stomach upset -trouble sleeping -trouble swallowing -weak or tired  This list may not describe all possible side effects. Call your doctor for medical advice about side effects. You may report side effects to FDA at 1-800-FDA-1088. Where should I keep my medicine? This drug is given in a hospital or clinic and will not be stored at home. NOTE: This sheet is a summary. It may not cover all possible information. If you have questions about this medicine, talk to your doctor, pharmacist, or health care provider.  2019 Elsevier/Gold Standard (2014-04-02 14:19:39) Denosumab injection What is this medicine? DENOSUMAB (den oh sue mab) slows bone breakdown. Prolia is used to treat osteoporosis in women after menopause and in men, and in people who are taking corticosteroids for 6 months or more. Delton See  is used to treat a high calcium level due to cancer and to prevent bone fractures and other bone problems caused by multiple myeloma or cancer bone metastases. Delton See is also used to treat giant cell tumor of the bone. This medicine may be used for other purposes; ask your health care provider or pharmacist if you have questions. COMMON BRAND NAME(S): Prolia, XGEVA What should I tell my health care provider before I take this medicine? They need to know if you have any of these conditions: -dental disease -having surgery or tooth extraction -infection -kidney disease -low levels of calcium or Vitamin D in the blood -malnutrition -on hemodialysis -skin conditions or sensitivity -thyroid or parathyroid disease -an unusual reaction to denosumab, other medicines, foods, dyes, or preservatives -pregnant or trying to get pregnant -breast-feeding How should I use this medicine? This medicine is for injection under the skin. It is given by a health care professional in a hospital or clinic setting. A special MedGuide will be given to you before each treatment. Be sure to read this information carefully each time. For Prolia, talk to your pediatrician regarding the use of this medicine in children. Special care may be needed. For Delton See, talk to your pediatrician regarding the use of this medicine in children. While this drug may be prescribed for children as young as 13 years for selected conditions, precautions do apply. Overdosage: If you think you have taken too much of this medicine contact a poison control center or emergency room at once. NOTE: This medicine is only for you. Do not share this medicine with others. What if I miss a dose? It is important not to miss your dose. Call your doctor or health care professional if you are unable to keep an appointment. What may interact with this medicine? Do not take this medicine with any of the following medications: -other medicines containing  denosumab This medicine may also interact with the following medications: -medicines that lower your chance of fighting infection -steroid medicines like prednisone or cortisone This list may not describe all possible interactions. Give your health care provider a list of all the medicines, herbs, non-prescription drugs, or dietary supplements you use. Also tell them if you smoke, drink alcohol, or use illegal drugs. Some items may interact with your medicine. What should I watch for while using this medicine? Visit your doctor or health care professional for regular checks on your progress. Your doctor or health care professional may order blood tests and other tests to see how you are doing. Call your doctor or health care professional for advice if you get a fever, chills or sore throat, or other symptoms of a cold or flu. Do not treat yourself. This drug may decrease your body's ability to fight infection. Try  to avoid being around people who are sick. You should make sure you get enough calcium and vitamin D while you are taking this medicine, unless your doctor tells you not to. Discuss the foods you eat and the vitamins you take with your health care professional. See your dentist regularly. Brush and floss your teeth as directed. Before you have any dental work done, tell your dentist you are receiving this medicine. Do not become pregnant while taking this medicine or for 5 months after stopping it. Talk with your doctor or health care professional about your birth control options while taking this medicine. Women should inform their doctor if they wish to become pregnant or think they might be pregnant. There is a potential for serious side effects to an unborn child. Talk to your health care professional or pharmacist for more information. What side effects may I notice from receiving this medicine? Side effects that you should report to your doctor or health care professional as soon as  possible: -allergic reactions like skin rash, itching or hives, swelling of the face, lips, or tongue -bone pain -breathing problems -dizziness -jaw pain, especially after dental work -redness, blistering, peeling of the skin -signs and symptoms of infection like fever or chills; cough; sore throat; pain or trouble passing urine -signs of low calcium like fast heartbeat, muscle cramps or muscle pain; pain, tingling, numbness in the hands or feet; seizures -unusual bleeding or bruising -unusually weak or tired Side effects that usually do not require medical attention (report to your doctor or health care professional if they continue or are bothersome): -constipation -diarrhea -headache -joint pain -loss of appetite -muscle pain -runny nose -tiredness -upset stomach This list may not describe all possible side effects. Call your doctor for medical advice about side effects. You may report side effects to FDA at 1-800-FDA-1088. Where should I keep my medicine? This medicine is only given in a clinic, doctor's office, or other health care setting and will not be stored at home. NOTE: This sheet is a summary. It may not cover all possible information. If you have questions about this medicine, talk to your doctor, pharmacist, or health care provider.  2019 Elsevier/Gold Standard (2018-03-13 16:10:44)

## 2019-01-06 NOTE — Progress Notes (Signed)
Pt here for follow up post radiation treatments. Radiation was completed on 11/23/2018 per patient. Denies any concerns at this time.

## 2019-01-06 NOTE — Progress Notes (Signed)
[facility] day:  01/06/2019   Chief Complaint: Megan Vega is a 63 y.o. female with stage IIIB right breast cancer and DCIS of the left breast who is seen for assessment following bilateral mastectomy and discussion regarding direction of therapy.  HPI: The patient was last seen in the medical oncology clinic by me on 08/24/2018.  At that time, she was recovering well s/p bilateral mastectomy.  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  She had increasing difficulty with fine motor skills.  She was on gabapentin.  She wished to try acupuncture.  Exam revealed well-healing post mastectomy sites BILATERALLY.  Labs were unremarkable.  She saw Dr. Bary Castilla on 09/01/2018 and 10/13/2018.  Notes reviewed. Her post-operative course was proceeding as expected without significant complications. Patient to follow up with surgery in 3 months.   She was seen by Dr. Baruch Gouty on 09/09/2018.  Notes reviewed.  Discussion was held regarding radiation to the right chest wall and peripheral lymphatics. He planned to treat both areas to 5040 cGy in 28 fractions. He would also boost her internal mammary node using photon therapy up to another 2000 cGy using 3-D treatment planning and PET/CT fusion study.  She received radiation from 09/23/2018 - 11/23/2018.  She saw Dr. Baruch Gouty on 12/28/2018 for a 1 month post therapy follow up. Notes reviewed. Patient was doing well; no concerns. She will follow up in 4-5 months for ongoing surveillance assessment.   Bone density on 11/24/2018 revealed osteopenia with a T-score of -2.1 in the LEFT femoral neck. Patient is taking calcium 1200 mg and vitamin D 800 IU daily as recommended. Insurance denied the use of denosumab (Prolia). She was considering IV zoledronic acid vs.oral bisphosphonates.   She underwent colonoscopy on 12/01/2018 by Dr. Allen Norris.  Findings revealed one 2 mm polyp in the ascending colon,  one 4 mm polyp in the transverse colon, diverticulosis in the entire examined colon, and non-bleeding internal hemorrhoids.  Pathology revealed benign colonic mucosa with superficial hyperplastic and serrated changes. Colonoscopy is planned for 5 years.  Earlier in her treatment course at the cancer center, patient was introduced to the clinical research staff regarding possible trial inclusion. Patient's diagnosis of a Her2/neu (-) breast cancer with (+) lymphovascular invasion made her potentially eligible for inclusion in the [name] trial. Written information was provided for patient and her daughter to review. Following review of the provided study resources, she has decided to voluntarily participate in the [name] trial.    During the interim, patient is doing well overall. She feels as if her energy has returned to baseline following recent completion of radiation therapy, which she tolerated well. Patient notes some minor skin discoloration and "rug burn" type areas following her radiation. She adds that these have improved daily since onset.   She notes very minor neuropathy in the tips of her fingers and toes. Fine motor skills have improved. She self discontinued the gabapentin citing that she did not appreciate a great deal of improvement. Patient returned to work 2 weeks after her surgery and has done remarkably well.   Patient denies that she has experienced any B symptoms. She denies any interval infections.  Patient does not verbalize any concerns with regards to her surgical or radiation sites today. Patient is scheduled to follow up with surgery Bary Castilla, MD) on 01/12/2019 for reassessment and to discuss plans for breast reconstruction with Dr. Marla Roe.   Patient advises that she maintains an  adequate appetite. She is eating well. Weight today is 148 lb 9.4 oz (67.4 kg), which compared to her last visit to the clinic, represents a 1 pound increase.    Patient denies pain in the clinic  today.   Past Medical History:  Diagnosis Date  . Ankle fracture   . Cancer [facility])    Basal Cell Carcinoma, about 2011 chest  . Cataract   . Family history of adverse reaction to anesthesia    DAUGHTER-N/V  . Hemorrhoids   . History of methicillin resistant staphylococcus aureus (MRSA) 2015  . Migraines    MIGRAINES  . Personal history of chemotherapy   . Personal history of radiation therapy     Past Surgical History:  Procedure Laterality Date  . BREAST BIOPSY Bilateral 10/29/2017   L breast DCIS, R breast invasive mammary carcinoma of no special type, ER/PR+, Her2/neu 1+  . CESAREAN SECTION    . COLONOSCOPY  2012  . COLONOSCOPY WITH PROPOFOL N/A 12/01/2018   Procedure: COLONOSCOPY WITH PROPOFOL;  Surgeon: Lucilla Lame, MD;  Location: [facility] ENDOSCOPY;  Service: Endoscopy;  Laterality: N/A;  . FRACTURE SURGERY    . IRRIGATION AND DEBRIDEMENT ABSCESS Right 03/12/2018   Procedure: IRRIGATION AND DEBRIDEMENT RIGHT AXILLARY ABSCESS;  Surgeon: Robert Bellow, MD;  Location: [facility] ORS;  Service: General;  Laterality: Right;  . MANDIBLE FRACTURE SURGERY    . MASTECTOMY    . MASTECTOMY MODIFIED RADICAL Right 07/10/2018   ypT2 ypN2a ER/PR positive, HER-2/neu negative  Surgeon: Robert Bellow, MD;  Location: [facility] ORS;  Service: General;  Laterality: Right;  . PORTACATH PLACEMENT Left 11/28/2017   Procedure: INSERTION PORT-A-CATH;  Surgeon: Robert Bellow, MD;  Location: [facility] ORS;  Service: General;  Laterality: Left;  . SIMPLE MASTECTOMY WITH AXILLARY SENTINEL NODE BIOPSY Left 07/10/2018   High-grade DCIS SIMPLE MASTECTOMY;  Surgeon: Robert Bellow, MD;  Location: [facility] ORS;  Service: General;  Laterality: Left;    Family History  Problem Relation Age of Onset  . Cancer Mother        Esophageal Cancer  . Early death Mother        Age 41  . Cancer Father        Lung Cancer  . Diabetes Father   . Diabetes Brother        Controlled with diet  . Heart attack Paternal  Grandfather   . Colon cancer Neg Hx     Social History:  reports that she quit smoking about 42 years ago. She has a 2.50 pack-year smoking history. She has never used smokeless tobacco. She reports current alcohol use of about 1.0 standard drinks of alcohol per week. She reports that she does not use drugs.  Patient is a former smoker for 20 years; only smoked "about 3 cigarettes a day"; stopped 30 years ago. Patient drinks alcohol very infrequently; "about 1 glass a month". Patient currently employed as an Web designer at [employer] She denies any known exposures to radiations or toxins.  She lives in [place].  Her grand daughter is graduating in 03/2018.  She has one daughter [name]).  She is accompanied by her daughter today.  Allergies:  Allergies  Allergen Reactions  . Peanut-Containing Drug Products Swelling and Other (See Comments)    Only when eating too many peanuts, swelling with lips and tingly face    Current Medications: Current Outpatient Medications  Medication Sig Dispense Refill  . acetaminophen (TYLENOL) 500 MG tablet Take 1,000 mg  by mouth every 6 (six) hours as needed for moderate pain or headache.    . b complex vitamins tablet Take 1 tablet by mouth daily.    Marland Kitchen CALCIUM PO Take 2 tablets by mouth daily.     . Multiple Vitamin (MULTIVITAMIN WITH MINERALS) TABS tablet Take 1 tablet by mouth daily.    . mupirocin ointment (BACTROBAN) 2 % Apply 1 application topically 2 (two) times daily. X 5 days to nose and bid x 7-14 days right leg 30 g 1  . polyethylene glycol (MIRALAX / GLYCOLAX) packet Take 17 g by mouth daily as needed (for constipation.).     Marland Kitchen Probiotic Product (PROBIOTIC PO) Take 1 capsule by mouth daily.    Marland Kitchen tetrahydrozoline 0.05 % ophthalmic solution Place 1 drop into both eyes 3 (three) times daily as needed (for dry/red eyes.).    Marland Kitchen aspirin-acetaminophen-caffeine (EXCEDRIN MIGRAINE) 250-250-65 MG tablet Take 1 tablet by mouth every  6 (six) hours as needed for headache or migraine.     No current facility-administered medications for this visit.    Facility-Administered Medications Ordered in Other Visits  Medication Dose Route Frequency Provider Last Rate Last Dose  . heparin lock flush 100 unit/mL  500 Units Intracatheter PRN Lequita Asal, MD        Review of Systems  Constitutional: Negative for diaphoresis, fever, malaise/fatigue and weight loss (up 1 pound).       "I am going really good. I feel well overall".   HENT: Negative.  Negative for congestion, nosebleeds and sore throat.   Eyes: Negative.  Negative for blurred vision, double vision, photophobia and pain.  Respiratory: Negative.  Negative for cough, hemoptysis, sputum production and shortness of breath.   Cardiovascular: Negative.  Negative for chest pain, palpitations, orthopnea, leg swelling and PND.  Gastrointestinal: Negative.  Negative for abdominal pain, blood in stool, constipation, diarrhea, melena, nausea and vomiting.  Genitourinary: Negative.  Negative for dysuria, frequency, hematuria and urgency.  Musculoskeletal: Negative.  Negative for back pain, falls, joint pain and myalgias.  Skin: Negative for itching and rash.  Neurological: Positive for tremors (essential; chronic), sensory change (minimal neuropathy to the tips of fingers and toes) and headaches (occassional migranes). Negative for dizziness and weakness.  Endo/Heme/Allergies: Negative.  Does not bruise/bleed easily.  Psychiatric/Behavioral: Negative for depression, memory loss and suicidal ideas. The patient is not nervous/anxious and does not have insomnia.   All other systems reviewed and are negative.  Performance status (ECOG): 0 - Asymptomatic  Vitals signs: BP 123/82 (BP Location: Left Arm, Patient Position: Sitting)   Pulse 76   Temp 97.8 F (36.6 C) (Oral)   Resp 16   Wt 148 lb 9.4 oz (67.4 kg)   SpO2 100%   BMI 25.51 kg/m   Physical Exam  Constitutional:  She is oriented to person, place, and time and well-developed, well-nourished, and in no distress. No distress.  HENT:  Head: Normocephalic and atraumatic.  Mouth/Throat: Oropharynx is clear and moist and mucous membranes are normal. No oropharyngeal exudate.  Short strawberry blonde wig.  Eyes: Pupils are equal, round, and reactive to light. Conjunctivae and EOM are normal. No scleral icterus.  Brown eyes.  Neck: Normal range of motion. Neck supple. No JVD present.  Cardiovascular: Normal rate, regular rhythm, normal heart sounds and intact distal pulses. Exam reveals no gallop and no friction rub.  No murmur heard. Pulmonary/Chest: Effort normal and breath sounds normal. No respiratory distress. She has no wheezes. She has  no rales.  Breasts are surgically absent; healing well after BILATERAL mastectomies.  Abdominal: Bowel sounds are normal. She exhibits no distension and no mass. There is no abdominal tenderness. There is no rebound and no guarding.  Musculoskeletal: Normal range of motion.        General: No tenderness or edema.  Lymphadenopathy:    She has no cervical adenopathy.    She has no axillary adenopathy.       Right: No inguinal and no supraclavicular adenopathy present.       Left: No inguinal and no supraclavicular adenopathy present.  Neurological: She is alert and oriented to person, place, and time. Gait normal.  Skin: Skin is warm and dry. No rash noted. She is not diaphoretic. No erythema. No pallor.  Psychiatric: Mood, affect and judgment normal.  Nursing note and vitals reviewed.   Results for orders placed or performed during the hospital encounter of 12/01/18  Surgical pathology  Result Value Ref Range   SURGICAL PATHOLOGY      Surgical Pathology CASE: [id number] PATIENT: Audelia Hives Surgical Pathology Report     SPECIMEN SUBMITTED: A. Colon polyp, ascending; cbx B. Colon polyp, transverse; cbx  CLINICAL HISTORY: None  provided  PRE-OPERATIVE DIAGNOSIS: Screening colonoscopy  POST-OPERATIVE DIAGNOSIS: Colon polyps     DIAGNOSIS: A. COLON POLYP, ASCENDING; COLD BIOPSY - BENIGN COLONIC MUCOSA WITH SUPERFICIAL HYPERPLASTIC CHANGES - NEGATIVE FOR DYSPLASIA AND MALIGNANCY.  Note: Multiple additional deeper recut levels were examined.  B. COLON POLYP, TRANSVERSE; COLD BIOPSY: - BENIGN COLONIC MUCOSA SHOWING SUPERFICIAL SERRATED CHANGES. - NEGATIVE FOR DYSPLASIA AND MALIGNANCY.  Note: Multiple additional deeper recut levels were examined. The changes could potentially represent a hyperplastic polyp, or an early sessile serrated polyp. There is no evidence of dysplasia.   GROSS DESCRIPTION: A. Labeled: C BX polyp ascending colon Received: Formalin Tissue fragment(s): 1  Size: 0.2 cm Description: Tan soft tissue fragment Entirely submitted in 1 cassette.  B. Labeled: C BX polyp transverse colon Received: Formalin Tissue fragment(s): 1 Size: 0.3 cm Description: Tan soft tissue fragment Entirely submitted in 1 cassette.   Final Diagnosis performed by Allena Napoleon, MD.   Electronically signed 12/03/2018 9:55:42AM The electronic signature indicates that the named Attending Pathologist has evaluated the specimen  Technical component performed at [facility], [address], [place], [place] [address] Lab: [phone] Dir: Rush Farmer, MD, MMM  Professional component performed at [facility], [facility], [place], [place], [place] [address] Lab: [phone] Dir: Dellia Nims. Reuel Derby, MD     Imaging studies: 10/23/2017:  Bilateral diagnostic mammogram and ultrasound revealed a 3.9 x 1.7 x 2.2 cm irregular hypoechoic mass with associated areas of vascularity at the 10 o'clock position 3 cm from the nipple and a 1.1 x 0.5 x 0.4 cm area of nodularity (3 approximately 3 mm nodules at 10 o'clock 6 cm from the nipple in the right breast. The mass was within a 5 cm area of pleomorphic  microcalcifications.  Ultrasound revealed at least 5 prominent lymph nodes with diffuse thickened and hypoechoic cortices suggestive of metastatic disease.  In the left breast, there was an irregular 1.6 x 1.3 x 1.2 cm hypoechoic mass with indistinct margins at 2 o'clock 3 cm from the nipple.  Axillary ultrasound revealed normal nodes. 11/17/2017:  Bilateral breast MRI revealed a 4.3 x 4.8 x 5 cm mass on the right centered in the upper outer quadrant but also extends into the lower outer and upper inner quadrants.  There was a  small amount of enhancement posterior to the right nipple which could represent subtle extension. There was a cluster of nodules located posterior to the superior aspect of the known malignancy and another small nodule located posterior to the inferior aspect of the malignancy which could represent satellite lesions. Taking the enhancement posterior to the right nipple and the potential posterior satellite lesions into account, the AP dimension could be as large as 7 cm.  The patient's known DCIS spans 5 x 5 x 4.4 cm mammographically based on calcifications. The associated enhancement spans 3.2 x 3.2 x 3.3 cm. However, there were 2 small linearly enhancing regions at 6 o'clock in the left breast which could represent further disease.  There was extensive adenopathy in the right axilla and right retropectoral region. There was at least 1 significantly enlarged right internal mammary lymph node. 11/24/2017:  PET scan revealed hypermetabolic right axillary (2.6 x 3.1 cm; SUV 8.0), subpectoral/internal mammary (7 mm; SUV 2.4) adenopathy.  There was a 4 mm right lower lobe nodule is too small for PET resolution.  There was cholelithiasis. 02/18/2018:  Breast ultrasound revealed a 1.0 x 1.36 x 2.14 cm dominant mass in the upper outer quadrant of the right breast (>50% volume decrease).  Axillary lymph nodes showed marked improvement with only 2 of  5 previous identified  lymph nodes identifiable  (largest 1.3 cm; smaller node with cortical thickness 0.39 compared to 0.99).   Assessment:  Latisia Hilaire is a 63 y.o. female with clinical stage [id number] right breast cancer and DCIS in the left breast s/p neoadjuvant chemotherapy followed by left simple mastectomy and right modified radical mastectomy.  Left breast pathology revealed 2.8 cm residual high grade DCIS, comedo type.  There was atypical lobular hyperplasia.  Four sentinel lymph nodes were negative.  Right breast pathology revealed 3.6 cm residual grade II invasive mammary carcinoma and high grade DCIS.  There was lymphovascular invasion.There was metastatic carcinoma in 2 sentinel lymph nodes.  Right axillary dissection revealed metastatic carcinoma in 7 lymph nodes.  There were a total of 9 lymph nodes with macrometastasis (largest 22 mm).  Pathologic stage was ypT2 ypN2a.  She underwent ultrasound guided biopsy on 10/29/2017. Pathology from the right breast at the 10 o'clock position 3 cm from the nipple revealed a 1.4 cm grade II invasive mammary carcinoma of no special type.  Tumor was ER + (> 90%), PR + (90%) and Her2/neu 1+.  Right axillary node biopsy revealed metastatic carcinoma.  Left breast biopsy at the 2 o'clock position 3 cm from the nipple revealed high grade DCIS with comedonecrosis.   She received 4 cycles of neoadjuvant AC (12/05/2017 - 01/30/2018) with OnPro Neulasta support.  She received 12 weeks of neoadjuvant Taxol (02/13/2018 - 02/27/2018; 04/10/2018 - 06/05/2018).  She received radiation from 09/23/2018 - 11/23/2018.  CA27.29 has been followed:  23.5 on 11/14/2017, < 3.5 on 08/27/2018, and 7.4 on 01/06/2019.  Bone density on 12/16/2016 revealed osteopenia with a T-score of -1.6 in the LEFT femoral neck on 12/16/2017.  Bone density on 11/24/2018 revealed osteopenia with a T-score of -2.1 in the LEFT femoral neck.  She developed a right axillary abscess and underwent incision and drainage on 03/12/2018.  Culture  grew staph aureus.  She completed Bactrim on 03/26/2018.  Symptomatically, she is doing well. She is back to her pre-treatment baseline following surgery and adjuvant radiation therapy. Neuropathy is minimal in the tips of her fingers and toes. Exam is unremarkable. WBC is 3500 ([place] 2100).  Plan: 1. Labs today:  CBC with diff, CMP, CA27.29.  2. Stagle IIIB RIGHT breast cancer  Doing well. No symptoms or concerns.   Continues to heal well from BILATERAL mastectomies.   Follows up with surgery Bary Castilla, MD) on 01/12/2019.  Anticipating breast reconstruction in 6-12 months with Dr. Marla Roe.   Patient completed adjuvant radiation on 11/23/2018.   Discuss continued treatment with adjuvant endocrine therapy  Review use of tamoxifen vs. aromatase inhibitor (AI) therapy.  Side effects of proposed medications reviewed. Written information also provided.   After reviewing risks vs. benefits, patient electing to pursue treatment with an AI    Rx: letrozole 2.5 mg daily.  Review clinical trial interest  Written information previously provided for patient review.   Interested in being consider for inclusion in the [employer] [phone] ([name] trial).  Randomized phase III double blinded placebo controlled study comparing the effects of adjuvant daily aspirin (300 mg) vs. placebo on invasive disease free survival (iDFS) rate in patients with stage II-III Her2/neu (-) breast cancer (RR 0.52 (95% CI 0.35-0.75)).   Will have lead research nurse Danny Lawless, RN) contact patient regarding any questions that she may have, and to discuss the necessary steps required for study inclusion.   Contact [facility] regarding available clinic trials- none.  Endocrine therapy +/- CDK 4/6 inhibitor trial closed. 3. Chemotherapy induced neuropathy  Patient has minimal neuropathy in the tips of her fingers and toes. She notes that her neuropathy does not impose any functional limitations, nor does it impede her ability  to ambulate safely.  Self discontinued gabapentin citing that she could not appreciate any improvement in her symptoms.   Discussed up titrate of her dose, however patient was not interested.  Never pursued CAM approach (accupuncture) as discussed at last visit.   4. Osteopenia  Review bone density from 11/24/2018.  T-score -2.1 in the LEFT femoral neck.  Ten-year fracture probability by FRAX of 2.7% or greater for hip fracture or 17.6% or greater for major osteoporotic fracture.   Discuss risk of further bone thinning associated with AI (letrozole) therapy.  Recommend denosumab (Prolia) q6 months, however insurance has denied coverage for this patient.   Review other options (zoledronic acid vs. oral bisphosphonates).  Considering zoledronic acid (Zometa).  Side effects of this medication reviewed.   Patient provided with printed information on Zometa on today's AVS.   Discuss need for dental clearance prior to beginning Zometa, secondary to increased risk of dental complications such as osteonecrosis of the jaw.   Currently calcium 1200 mg and vitamin D 800 IU daily as recommended.  5. RTC every 6-8 weeks for venous access device (port) flushes.  6. RTC in 1 month for MD assessment and labs (LFTs), and +/- zoledronic acid.   Honor Loh, NP 01/06/2019, 1:35 PM   I saw and evaluated the patient, participating in the key portions of the service and reviewing pertinent diagnostic studies and records.  I reviewed the nurse practitioner's note and agree with the findings and the plan.  The assessment and plan were discussed with the patient.  Numerous questions were asked by the patient and answered.   Nolon Stalls, MD 01/06/2019,1:35 PM

## 2019-01-07 LAB — CANCER ANTIGEN 27.29: CA 27.29: 7.4 U/mL (ref 0.0–38.6)

## 2019-01-12 ENCOUNTER — Encounter: Payer: Self-pay | Admitting: General Surgery

## 2019-01-12 ENCOUNTER — Ambulatory Visit: Payer: 59 | Admitting: General Surgery

## 2019-01-12 ENCOUNTER — Other Ambulatory Visit: Payer: Self-pay

## 2019-01-12 VITALS — BP 118/76 | HR 72 | Temp 97.5°F | Resp 16 | Ht 64.0 in | Wt 148.2 lb

## 2019-01-12 DIAGNOSIS — C50411 Malignant neoplasm of upper-outer quadrant of right female breast: Secondary | ICD-10-CM | POA: Diagnosis not present

## 2019-01-12 DIAGNOSIS — D0512 Intraductal carcinoma in situ of left breast: Secondary | ICD-10-CM | POA: Diagnosis not present

## 2019-01-12 NOTE — Progress Notes (Signed)
Patient ID: Megan Vega, female   DOB: June 20, 1956, 63 y.o.   MRN: [id number]  Chief Complaint  [name]    breast ca, no mammo    HPI Megan Vega is a 63 y.o. female.  Here for follow up bilateral breast cancer. She has completed radiation about 1 month ago, reporting a marked improvement in her energy level about 2 weeks after completion.  She has started Letrozole and has tolerated this well. She has lost a little weight but states she "feels good".  HPI  Past Medical History:  Diagnosis Date  . Ankle fracture   . Cancer [facility])    Basal Cell Carcinoma, about 2011 chest  . Cataract   . Family history of adverse reaction to anesthesia    DAUGHTER-N/V  . Hemorrhoids   . History of methicillin resistant staphylococcus aureus (MRSA) 2015  . Migraines    MIGRAINES  . Personal history of chemotherapy   . Personal history of radiation therapy     Past Surgical History:  Procedure Laterality Date  . BREAST BIOPSY Bilateral 10/29/2017   L breast DCIS, R breast invasive mammary carcinoma of no special type, ER/PR+, Her2/neu 1+  . CESAREAN SECTION    . COLONOSCOPY  2012  . COLONOSCOPY WITH PROPOFOL N/A 12/01/2018   Procedure: COLONOSCOPY WITH PROPOFOL;  Surgeon: Lucilla Lame, MD;  Location: [facility] ENDOSCOPY;  Service: Endoscopy;  Laterality: N/A;  . FRACTURE SURGERY    . IRRIGATION AND DEBRIDEMENT ABSCESS Right 03/12/2018   Procedure: IRRIGATION AND DEBRIDEMENT RIGHT AXILLARY ABSCESS;  Surgeon: Megan Bellow, MD;  Location: [facility] ORS;  Service: General;  Laterality: Right;  . MANDIBLE FRACTURE SURGERY    . MASTECTOMY    . MASTECTOMY MODIFIED RADICAL Right 07/10/2018   ypT2 ypN2a ER/PR positive, HER-2/neu negative  Surgeon: Megan Bellow, MD;  Location: [facility] ORS;  Service: General;  Laterality: Right;  . PORTACATH PLACEMENT Left 11/28/2017   Procedure: INSERTION PORT-A-CATH;  Surgeon: Megan Bellow, MD;  Location: [facility] ORS;  Service: General;   Laterality: Left;  . SIMPLE MASTECTOMY WITH AXILLARY SENTINEL NODE BIOPSY Left 07/10/2018   High-grade DCIS SIMPLE MASTECTOMY;  Surgeon: Megan Bellow, MD;  Location: [facility] ORS;  Service: General;  Laterality: Left;    Family History  Problem Relation Age of Onset  . Cancer Mother        Esophageal Cancer  . Early death Mother        Age 40  . Cancer Father        Lung Cancer  . Diabetes Father   . Diabetes Brother        Controlled with diet  . Heart attack Paternal Grandfather   . Colon cancer Neg Hx     Social History Social History   Tobacco Use  . Smoking status: Former Smoker    Packs/day: 0.25    Years: 10.00    Pack years: 2.50    Last attempt to quit: 1978    Years since quitting: 42.1  . Smokeless tobacco: Never Used  Substance Use Topics  . Alcohol use: Yes    Alcohol/week: 1.0 standard drinks    Types: 1 Glasses of wine per week    Comment: RARE  . Drug use: No    Allergies  Allergen Reactions  . Peanut-Containing Drug Products Swelling and Other (See Comments)    Only when eating too many peanuts, swelling with lips and tingly face    Current Outpatient Medications  Medication Sig Dispense Refill  . acetaminophen (TYLENOL) 500 MG tablet Take 1,000 mg by mouth every 6 (six) hours as needed for moderate pain or headache.    Marland Kitchen aspirin-acetaminophen-caffeine (EXCEDRIN MIGRAINE) 250-250-65 MG tablet Take 1 tablet by mouth every 6 (six) hours as needed for headache or migraine.    Marland Kitchen b complex vitamins tablet Take 1 tablet by mouth daily.    Marland Kitchen CALCIUM PO Take 2 tablets by mouth daily.     Marland Kitchen letrozole (FEMARA) 2.5 MG tablet Take 1 tablet (2.5 mg total) by mouth daily. 30 tablet 0  . Multiple Vitamin (MULTIVITAMIN WITH MINERALS) TABS tablet Take 1 tablet by mouth daily.    . mupirocin ointment (BACTROBAN) 2 % Apply 1 application topically 2 (two) times daily. X 5 days to nose and bid x 7-14 days right leg 30 g 1  . polyethylene glycol (MIRALAX / GLYCOLAX)  packet Take 17 g by mouth daily as needed (for constipation.).     Marland Kitchen Probiotic Product (PROBIOTIC PO) Take 1 capsule by mouth daily.    Marland Kitchen tetrahydrozoline 0.05 % ophthalmic solution Place 1 drop into both eyes 3 (three) times daily as needed (for dry/red eyes.).     No current facility-administered medications for this visit.    Facility-Administered Medications Ordered in Other Visits  Medication Dose Route Frequency Provider Last Rate Last Dose  . heparin lock flush 100 unit/mL  500 Units Intracatheter PRN Megan Asal, MD        Review of Systems Review of Systems  Constitutional: Negative.   Respiratory: Negative.   Cardiovascular: Negative.     Blood pressure 118/76, pulse 72, temperature (!) 97.5 F (36.4 C), temperature source Skin, resp. rate 16, height 5' 4"  (1.626 m), weight 148 lb 3.2 oz (67.2 kg), SpO2 95 %.  Physical Exam Physical Exam Exam conducted with a chaperone present.  Constitutional:      Appearance: She is well-developed.  Eyes:     General: No scleral icterus.    Conjunctiva/sclera: Conjunctivae normal.  Neck:     Musculoskeletal: Neck supple.  Cardiovascular:     Rate and Rhythm: Normal rate and regular rhythm.     Heart sounds: Normal heart sounds.  Pulmonary:     Effort: Pulmonary effort is normal.     Breath sounds: Normal breath sounds.  Chest:       Comments: Excellent upper extremity motion Lymphadenopathy:     Cervical: No cervical adenopathy.     Upper Body:     Right upper body: No supraclavicular or axillary adenopathy.     Left upper body: No supraclavicular or axillary adenopathy.  Skin:    General: Skin is warm and dry.  Neurological:     Mental Status: She is alert and oriented to person, place, and time.  Psychiatric:        Behavior: Behavior normal.   Upper extremity measurements were obtained at a location 15 cm above as well as 10 and 20 cm below the olecranon process.  January 12, 2019 Right: 31.5, 25.5,  18.5. Left: 31, 25, 17.5.  October 13, 2018 Right: 31, 25, 19 cm. Left: 30, 23.5, 18 cm.  Minimal interval change in the right upper extremity.  Observation alone at present.  Data Reviewed Colonoscopy completed December 01, 2018 showed hyperplastic polyps.  Assessment Doing well post neoadjuvant chemotherapy, mastectomy and post mastectomy chest wall radiation for multiple positive axillary nodes.  Plan  We will contact Megan Hives, DO at plastic  surgery to determine when the patient should present for assessment to discuss reconstruction.  Patient to have a follow up exam here in 6 months.  The patient is aware to call back for any questions or new concerns.    HPI, assessment, plan and physical exam has been scribed under the direction and in the presence of Megan Bellow, MD. Megan Fetch, RN  I have completed the exam and reviewed the above documentation for accuracy and completeness.  I agree with the above.  Megan Vega has been used and any errors in dictation or transcription are unintentional.  Hervey Ard, M.D., F.A.C.S. Megan Vega 01/13/2019, 10:15 AM

## 2019-01-12 NOTE — Patient Instructions (Signed)
The patient is aware to call back for any questions or new concerns.  

## 2019-01-13 ENCOUNTER — Encounter: Payer: Self-pay | Admitting: General Surgery

## 2019-01-13 ENCOUNTER — Encounter: Payer: Self-pay | Admitting: Urgent Care

## 2019-01-14 ENCOUNTER — Encounter: Payer: Self-pay | Admitting: *Deleted

## 2019-01-14 DIAGNOSIS — C50411 Malignant neoplasm of upper-outer quadrant of right female breast: Secondary | ICD-10-CM

## 2019-01-14 NOTE — Research (Signed)
Patient Megan Vega presented to clinic this afternoon for the purpose of providing written consent to participate in the [facility] [id number] [facility] research study. Patient was provided information on the study including a copy of the consent form and my contact information on 11/19/2018 while still receiving radiation therapy. She was seen in clinic last week by Dr. Mike Gip and expressed interest in participating in the study. Dr. Mike Gip referred patient and after phone contact, patient confirmed that she has read the consent form and is interested in study participation. Appointment made for patient to come in this afternoon to provide written consent for the study. Reviewed informed consent form with patient page by page to ensure all of her questions were answered while in clinic. Stressed to patient that participation is strictly voluntary and that she can withdraw at any time and for any reason. Patient is aware that she will not be paid to participate in the study, but that she will not be billed for study-specific procedures and that her study drug - Aspirin/placebo, will be provided by [facility] at no cost to her. Discussed randomization process at length and that there is a 50/50 chance she will receive placebo or the actual Aspirin, but that neither she, nor the study team will be aware whether she is actually receiving the Aspirin; however we will monitor her as if she is actually taking aspirin and will assess for specific adverse events with each clinic visit. Risks and benefits were reviewed with patient and after all questions were answered to her satisfaction, patient provided written consent to the [facility] [phone] protocol version date 10/14/2017, with [place] active date of 04/30/18 as well as the accompanying study specific [employer] HIPAA dated 10/26/15. She also signed the [facility] Release of Information form and the [facility] consent to release tumor tissue. Megan Vega was also  asked to participate in the DCP-001 study, to which she agreed. Informed consent for the [facility] DCP-001 protocol version date 01/08/18 and the accompanying [employer] HIPAA form dated 01/09/15 were reviewed with patient and she provided written consent for both forms to participate in this study as well. Megan Vega was given copies of all signed consent forms for her records along with an information letter from the study. While in clinic patient completed the DCP-001 patient worksheet as well as the [facility] [phone] Baseline patient questionnaire.  [name], BSN, MHA, OCN 01/14/2019 4:57 PM   Patient contacted by phone this morning to review baseline solicited AEs and verify that she is not taking any aspirin or other NSAIDs regularly. Megan Vega states she has taken Excedrin Migraine in the past for Migraine headaches, but was told during chemotherapy class that she did not need to take any medications that contain aspirin or any NSAIDs while on chemotherapy. States she has only used Acetaminophen since that time and denies using any Aspirin or other NSAIDS at all. She also denies experiencing any of the solicited AEs at baseline including GI bleeding, intracranial hemorrhage, hematuria, dyspepsia, gastritis or bruising. States she has a history of nosebleeds as a child, but has not experienced this in many years. Patient was found to meet all of the eligibility criteria and none of the ineligibility criteria for the study. 2nd eligibility verified by Doreatha Martin, RN on 01/19/2019, and Dr. Mike Gip is in agreement that the patient is eligible and a good candidate for the study. Megan Vega was informed that we will register her to the study and order the study drug.  We plan to schedule a time next week for her Central study labs to be collected prior to beginning treatment with the study drug. Tumor tissue will also be requested and submitted to [facility] prior to patient beginning study drug. Megan Vega is in  agreement with this plan.  [name], BSN, MHA, OCN 01/21/2019 10:21 AM  T/C made to patient Megan Vega this afternoon to schedule her initial study visit to collect study labs, dispense study drug and instruct on use of medication calendars. Patient's tumor tissue was collected from [facility] and shipped yesterday - 01/26/2019. Patient scheduled to return to [facility] tomorrow after she leaves work at 4:00pm for central study labs and treatment initiation. [name], BSN, MHA, OCN 01/27/2019 4:15 PM

## 2019-01-20 ENCOUNTER — Encounter: Payer: Self-pay | Admitting: Urgent Care

## 2019-01-28 ENCOUNTER — Other Ambulatory Visit: Payer: Self-pay

## 2019-01-28 ENCOUNTER — Inpatient Hospital Stay: Payer: 59 | Attending: Hematology and Oncology | Admitting: *Deleted

## 2019-01-28 ENCOUNTER — Encounter: Payer: Self-pay | Admitting: *Deleted

## 2019-01-28 DIAGNOSIS — Z95828 Presence of other vascular implants and grafts: Secondary | ICD-10-CM

## 2019-01-28 DIAGNOSIS — C50411 Malignant neoplasm of upper-outer quadrant of right female breast: Secondary | ICD-10-CM | POA: Insufficient documentation

## 2019-01-28 DIAGNOSIS — Z170 Estrogen receptor positive status [ER+]: Secondary | ICD-10-CM | POA: Insufficient documentation

## 2019-01-28 NOTE — Research (Signed)
Patient Megan Vega returned to clinic this afternoon as scheduled to have study labs collected and to receive her study drug and med calendars. Study required serum and urine were collected by the lab prior to patient being dispensed her study drug. Reviewed the medication calendar with patient and instructed her to document the time of day and number of tablets - which is one 300mg  tablet daily on the calendar in the column that corresponds to the date she takes the medication. She was dispensed patient specific aspirin/placebo - 300mg  tablets, #200 with study ID [id number] on the label. Patient instructed to take the study drug at the same time daily with food or a full glass of water. She was also instructed to return her medication calendars at her next clinic visit, which will be in about 6 months. Informed Ms. Ernandez that we make every effort to schedule our study visits to occur at a clinic visit that has already been scheduled in order to avoid having extra visits scheduled for her. Patient denies having any questions at this time and she also denies experiencing any of the solicited AEs at baseline including: GI bleeding, intracranial hemorrhage, hematuria, epistaxis, dyspepsia, gastritis or bruising. She also reports other baseline AEs including mild, chronic tremors, some mild sensory neuropathy in tips of fingers and toes and reports occasional migraine headaches.   Adverse Event Log  Study/Protocol: [id number] [name] Cycle: Baseline  Event Grade Onset Date Resolved Date Drug Name Attribution Treatment Comments  GI Bleeding 0        Intracranial hemorrhage 0        Epistaxis 0        Hematuria 0        Dyspepsia 0        Gastritis 0        Bruising 0        Chronic tremors 1 baseline   unrelated    Peripheral sensory neuropathy 1 baseline   unrelated B-complex vitamins Residual from chemotherapy  Occasional migraines 1 baseline   unrelated prn Excedrin migraine    [name], BSN, MHA,  OCN 01/28/2019 4:25 PM

## 2019-02-03 ENCOUNTER — Other Ambulatory Visit: Payer: 59

## 2019-02-03 ENCOUNTER — Ambulatory Visit: Payer: 59

## 2019-02-03 ENCOUNTER — Ambulatory Visit: Payer: 59 | Admitting: Hematology and Oncology

## 2019-02-05 ENCOUNTER — Other Ambulatory Visit: Payer: Self-pay

## 2019-02-05 ENCOUNTER — Inpatient Hospital Stay: Payer: 59

## 2019-02-05 ENCOUNTER — Encounter: Payer: Self-pay | Admitting: Urgent Care

## 2019-02-05 ENCOUNTER — Other Ambulatory Visit: Payer: Self-pay | Admitting: Urgent Care

## 2019-02-05 DIAGNOSIS — C50411 Malignant neoplasm of upper-outer quadrant of right female breast: Secondary | ICD-10-CM | POA: Diagnosis not present

## 2019-02-05 DIAGNOSIS — Z170 Estrogen receptor positive status [ER+]: Secondary | ICD-10-CM | POA: Diagnosis not present

## 2019-02-05 LAB — HEPATIC FUNCTION PANEL
ALT: 15 U/L (ref 0–44)
AST: 23 U/L (ref 15–41)
Albumin: 4.3 g/dL (ref 3.5–5.0)
Alkaline Phosphatase: 84 U/L (ref 38–126)
Bilirubin, Direct: <0.1 mg/dL (ref 0.0–0.2)
Total Bilirubin: 0.5 mg/dL (ref 0.3–1.2)
Total Protein: 7.6 g/dL (ref 6.5–8.1)

## 2019-02-05 MED ORDER — LETROZOLE 2.5 MG PO TABS: 2.5000 mg | ORAL_TABLET | Freq: Every day | ORAL | 0 refills | Status: DC

## 2019-02-25 ENCOUNTER — Inpatient Hospital Stay: Payer: 59

## 2019-03-25 ENCOUNTER — Inpatient Hospital Stay: Payer: 59 | Attending: Hematology and Oncology

## 2019-03-25 ENCOUNTER — Other Ambulatory Visit: Payer: Self-pay

## 2019-03-25 VITALS — Temp 97.4°F | Resp 16

## 2019-03-25 DIAGNOSIS — C50411 Malignant neoplasm of upper-outer quadrant of right female breast: Secondary | ICD-10-CM | POA: Insufficient documentation

## 2019-03-25 DIAGNOSIS — Z170 Estrogen receptor positive status [ER+]: Secondary | ICD-10-CM | POA: Diagnosis not present

## 2019-03-25 DIAGNOSIS — Z452 Encounter for adjustment and management of vascular access device: Secondary | ICD-10-CM | POA: Insufficient documentation

## 2019-03-25 DIAGNOSIS — Z95828 Presence of other vascular implants and grafts: Secondary | ICD-10-CM

## 2019-03-25 MED ORDER — SODIUM CHLORIDE 0.9% FLUSH
10.0000 mL | INTRAVENOUS | Status: DC | PRN
  Administered 2019-03-25: 15:00:00 10 mL via INTRAVENOUS
  Filled 2019-03-25: qty 10

## 2019-03-25 MED ORDER — HEPARIN SOD (PORK) LOCK FLUSH 100 UNIT/ML IV SOLN
500.0000 [IU] | Freq: Once | INTRAVENOUS | Status: AC
  Administered 2019-03-25: 15:00:00 500 [IU] via INTRAVENOUS

## 2019-04-15 ENCOUNTER — Encounter: Payer: Self-pay | Admitting: Hematology and Oncology

## 2019-05-02 ENCOUNTER — Encounter: Payer: Self-pay | Admitting: Hematology and Oncology

## 2019-05-03 ENCOUNTER — Other Ambulatory Visit: Payer: Self-pay

## 2019-05-04 MED ORDER — LETROZOLE 2.5 MG PO TABS: 2.5000 mg | ORAL_TABLET | Freq: Every day | ORAL | 0 refills | Status: DC

## 2019-05-06 ENCOUNTER — Encounter: Payer: Self-pay | Admitting: Hematology and Oncology

## 2019-05-17 ENCOUNTER — Other Ambulatory Visit: Payer: Self-pay

## 2019-05-17 DIAGNOSIS — D0512 Intraductal carcinoma in situ of left breast: Secondary | ICD-10-CM

## 2019-05-17 DIAGNOSIS — C50411 Malignant neoplasm of upper-outer quadrant of right female breast: Secondary | ICD-10-CM

## 2019-05-17 DIAGNOSIS — M85852 Other specified disorders of bone density and structure, left thigh: Secondary | ICD-10-CM

## 2019-05-23 NOTE — Progress Notes (Signed)
[facility]  [address], [address] [place], [place] [address] Phone: [phone]  Fax: [phone]   Clinic Day:  05/24/2019  Referring physician: Burnard Hawthorne, FNP  Chief Complaint: Megan Vega is a 63 y.o. female with stage IIIB right breast cancer and DCIS of the left breast who is seen for 5 month assessment.   HPI: The patient was last seen in the medical oncology clinic on 01/06/2019. At that time, she was doing well. She was back to her pre-treatment baseline following surgery and adjuvant radiation therapy. Neuropathy was minimal in the tips of her fingers and toes. Exam was unremarkable. WBC was 3500 (Benson 2100).   CA27.29 was 7.4.  She was seen by her surgeon, Dr. Bary Castilla, on 01/12/2019. She had minimal right upper extremity edema. She was referred to Audelia Hives, DO for discussion regarding reconstruction. Six-month follow-up was scheduled.   On 01/14/2019, she was enrolled in the Alliance clinical trial.   Hepatic function panel on 02/05/2019 was within normal limits.   During the interim, she is doing "okay." She notes worsening neuropathy in her hands and the bottom of her feet, for which she is requesting to go on gabapentin. She is afraid to walk at times for fear of falling.   Her right arm lymphedema has worsened. She notes joint pain in her wrists and ankles, which she attributes to letrozole and has improved with taking Claritin once daily.   She is interested in being seen in the lymphedema clinic. She has not yet seen Audelia Hives, DO because she needed to wait 6 months after radiation therapy was completed, but she is interested.   She takes a B vitamin and calcium daily. She was seen by her dentist about 6 weeks ago and had a tooth pulled. She was cleared for Zometa.    Past Medical History:  Diagnosis Date  . Ankle fracture   . Cancer [facility])    Basal Cell Carcinoma, about 2011 chest  . Cataract   . Family  history of adverse reaction to anesthesia    DAUGHTER-N/V  . Hemorrhoids   . History of methicillin resistant staphylococcus aureus (MRSA) 2015  . Migraines    MIGRAINES  . Personal history of chemotherapy   . Personal history of radiation therapy     Past Surgical History:  Procedure Laterality Date  . BREAST BIOPSY Bilateral 10/29/2017   L breast DCIS, R breast invasive mammary carcinoma of no special type, ER/PR+, Her2/neu 1+  . CESAREAN SECTION    . COLONOSCOPY  2012  . COLONOSCOPY WITH PROPOFOL N/A 12/01/2018   Procedure: COLONOSCOPY WITH PROPOFOL;  Surgeon: Lucilla Lame, MD;  Location: [facility] ENDOSCOPY;  Service: Endoscopy;  Laterality: N/A;  . FRACTURE SURGERY    . IRRIGATION AND DEBRIDEMENT ABSCESS Right 03/12/2018   Procedure: IRRIGATION AND DEBRIDEMENT RIGHT AXILLARY ABSCESS;  Surgeon: Robert Bellow, MD;  Location: [facility] ORS;  Service: General;  Laterality: Right;  . MANDIBLE FRACTURE SURGERY    . MASTECTOMY    . MASTECTOMY MODIFIED RADICAL Right 07/10/2018   ypT2 ypN2a ER/PR positive, HER-2/neu negative  Surgeon: Robert Bellow, MD;  Location: [facility] ORS;  Service: General;  Laterality: Right;  . PORTACATH PLACEMENT Left 11/28/2017   Procedure: INSERTION PORT-A-CATH;  Surgeon: Robert Bellow, MD;  Location: [facility] ORS;  Service: General;  Laterality: Left;  . SIMPLE MASTECTOMY WITH AXILLARY SENTINEL NODE BIOPSY Left 07/10/2018   High-grade DCIS SIMPLE MASTECTOMY;  Surgeon: Robert Bellow, MD;  Location: [facility] ORS;  Service: General;  Laterality: Left;    Family History  Problem Relation Age of Onset  . Cancer Mother        Esophageal Cancer  . Early death Mother        Age 20  . Cancer Father        Lung Cancer  . Diabetes Father   . Diabetes Brother        Controlled with diet  . Heart attack Paternal Grandfather   . Colon cancer Neg Hx     Social History:  reports that she quit smoking about 42 years ago. She has a 2.50 pack-year smoking history. She has  never used smokeless tobacco. She reports current alcohol use of about 1.0 standard drinks of alcohol per week. She reports that she does not use drugs. Patient is a former smoker for 20 years; only smoked "about 3 cigarettes a day"; stopped 30 years ago. Patient drinks alcohol very infrequently; "about 1 glass a month". Patient currently employed as an Web designer at [employer] She denies any known exposures to radiations or toxins.  She lives in [place].  Her grand daughter is graduating in 03/2018.  She has one daughter [name]).  She is alone today.  Allergies:  Allergies  Allergen Reactions  . Peanut-Containing Drug Products Swelling and Other (See Comments)    Only when eating too many peanuts, swelling with lips and tingly face    Current Medications: Current Outpatient Medications  Medication Sig Dispense Refill  . acetaminophen (TYLENOL) 500 MG tablet Take 1,000 mg by mouth every 6 (six) hours as needed for moderate pain or headache.    Marland Kitchen aspirin-acetaminophen-caffeine (EXCEDRIN MIGRAINE) 250-250-65 MG tablet Take 1 tablet by mouth every 6 (six) hours as needed for headache or migraine.    Marland Kitchen b complex vitamins tablet Take 1 tablet by mouth daily.    Marland Kitchen CALCIUM PO Take 1 tablet by mouth daily. 1200 MG Tablets    . letrozole (FEMARA) 2.5 MG tablet Take 1 tablet (2.5 mg total) by mouth daily. 90 tablet 0  . Melatonin 3 MG CAPS Take 1 capsule by mouth daily.    . Multiple Vitamin (MULTIVITAMIN WITH MINERALS) TABS tablet Take 1 tablet by mouth daily.    . mupirocin ointment (BACTROBAN) 2 % Apply 1 application topically 2 (two) times daily. X 5 days to nose and bid x 7-14 days right leg 30 g 1  . polyethylene glycol (MIRALAX / GLYCOLAX) packet Take 17 g by mouth daily as needed (for constipation.).     Marland Kitchen Probiotic Product (PROBIOTIC PO) Take 1 capsule by mouth daily.    Marland Kitchen tetrahydrozoline 0.05 % ophthalmic solution Place 1 drop into both eyes 3 (three) times daily  as needed (for dry/red eyes.).     No current facility-administered medications for this visit.    Facility-Administered Medications Ordered in Other Visits  Medication Dose Route Frequency Provider Last Rate Last Dose  . heparin lock flush 100 unit/mL  500 Units Intracatheter PRN Corcoran, Melissa C, MD      . heparin lock flush 100 unit/mL  500 Units Intravenous Once Corcoran, Melissa C, MD      . sodium chloride flush (NS) 0.9 % injection 10 mL  10 mL Intravenous PRN Nolon Stalls C, MD   10 mL at 05/24/19 1318    Review of Systems  Constitutional: Negative for chills, diaphoresis, fever, malaise/fatigue and weight loss (up 8 pound).  Doing "okay."  HENT: Negative.  Negative for congestion, hearing loss, nosebleeds and sore throat.   Eyes: Negative.  Negative for blurred vision, double vision, photophobia and pain.  Respiratory: Negative.  Negative for cough, hemoptysis, sputum production and shortness of breath.   Cardiovascular: Negative.  Negative for chest pain, palpitations, orthopnea, leg swelling and PND.       Lymphedema of right arm  Gastrointestinal: Negative.  Negative for abdominal pain, blood in stool, constipation, diarrhea, melena, nausea and vomiting.  Genitourinary: Negative.  Negative for dysuria, frequency, hematuria and urgency.  Musculoskeletal: Positive for joint pain (wrist, ankles). Negative for back pain, falls and myalgias.  Skin: Negative for itching and rash.  Neurological: Positive for tremors (essential; chronic), sensory change (worsening neuropathy in bottom of feet and hands) and headaches (occassional migranes). Negative for dizziness and weakness.  Endo/Heme/Allergies: Negative.  Does not bruise/bleed easily.  Psychiatric/Behavioral: Negative for depression, memory loss and suicidal ideas. The patient is not nervous/anxious and does not have insomnia.   All other systems reviewed and are negative.  Performance status (ECOG): 0  Blood  pressure 119/84, pulse 76, temperature 97.9 F (36.6 C), temperature source Tympanic, resp. rate 16, weight 156 lb 15.5 oz (71.2 kg), SpO2 97 %.   Physical Exam  Constitutional: She is oriented to person, place, and time. She appears well-developed and well-nourished. No distress.  HENT:  Head: Normocephalic and atraumatic.  Right Ear: Hearing normal.  Left Ear: Hearing, tympanic membrane and ear canal normal.  Nose: Right sinus exhibits no maxillary sinus tenderness and no frontal sinus tenderness. Left sinus exhibits no maxillary sinus tenderness and no frontal sinus tenderness.  Mouth/Throat: Uvula is midline, oropharynx is clear and moist and mucous membranes are normal. No oropharyngeal exudate.  Short strawberry blonde hair  Eyes: Pupils are equal, round, and reactive to light. Conjunctivae and EOM are normal. No scleral icterus.  Brown eyes.   Neck: Normal range of motion. Neck supple.  Cardiovascular: Normal rate, regular rhythm and normal heart sounds.  No murmur heard. Pulmonary/Chest: Effort normal and breath sounds normal. No respiratory distress. She has no wheezes. Right breast exhibits no inverted nipple, no mass, no nipple discharge, no skin change and no tenderness. Left breast exhibits tenderness (axilla). Left breast exhibits no inverted nipple, no mass, no nipple discharge and no skin change. Breasts are symmetrical (surgically absent s/p bilateral mastectomies).  Abdominal: Soft. Bowel sounds are normal. She exhibits no distension. There is no abdominal tenderness.  Musculoskeletal: Normal range of motion.        General: No edema.  Lymphadenopathy:    She has no cervical adenopathy.    She has no axillary adenopathy.       Right: No supraclavicular adenopathy present.       Left: No supraclavicular adenopathy present.  Neurological: She is alert and oriented to person, place, and time.  Skin: Skin is warm and dry. No rash noted. She is not diaphoretic.  Psychiatric:  She has a normal mood and affect. Her behavior is normal. Judgment and thought content normal.  Nursing note and vitals reviewed.   Imaging studies: 10/23/2017:  Bilateral diagnostic mammogram and ultrasound revealed a 3.9 x 1.7 x 2.2 cm irregular hypoechoic mass with associated areas of vascularity at the 10 o'clock position 3 cm from the nipple and a 1.1 x 0.5 x 0.4 cm area of nodularity (3 approximately 3 mm nodules at 10 o'clock 6 cm from the nipple in the right breast. The mass was  within a 5 cm area of pleomorphic microcalcifications.  Ultrasound revealed at least 5 prominent lymph nodes with diffuse thickened and hypoechoic cortices suggestive of metastatic disease.  In the left breast, there was an irregular 1.6 x 1.3 x 1.2 cm hypoechoic mass with indistinct margins at 2 o'clock 3 cm from the nipple.  Axillary ultrasound revealed normal nodes. 11/17/2017:  Bilateral breast MRI revealed a 4.3 x 4.8 x 5 cm mass on the right centered in the upper outer quadrant but also extends into the lower outer and upper inner quadrants.  There was a small amount of enhancement posterior to the right nipple which could represent subtle extension. There was a cluster of nodules located posterior to the superior aspect of the known malignancy and another small nodule located posterior to the inferior aspect of the malignancy which could represent satellite lesions. Taking the enhancement posterior to the right nipple and the potential posterior satellite lesions into account, the AP dimension could be as large as 7 cm.  The patient's known DCIS spans 5 x 5 x 4.4 cm mammographically based on calcifications. The associated enhancement spans 3.2 x 3.2 x 3.3 cm. However, there were 2 small linearly enhancing regions at 6 o'clock in the left breast which could represent further disease.  There was extensive adenopathy in the right axilla and right retropectoral region. There was at least 1 significantly enlarged right  internal mammary lymph node. 11/24/2017:  PET scan revealed hypermetabolic right axillary (2.6 x 3.1 cm; SUV 8.0), subpectoral/internal mammary (7 mm; SUV 2.4) adenopathy.  There was a 4 mm right lower lobe nodule is too small for PET resolution.  There was cholelithiasis. 02/18/2018:  Breast ultrasound revealed a 1.0 x 1.36 x 2.14 cm dominant mass in the upper outer quadrant of the right breast (>50% volume decrease). Axillary lymph nodes showed marked improvement with only 2 of 5previous identified lymph nodes identifiable (largest 1.3 cm; smaller node with cortical thickness 0.39 compared to 0.99).   Infusion on 05/24/2019  Component Date Value Ref Range Status  . Sodium 05/24/2019 135  135 - 145 mmol/L Final  . Potassium 05/24/2019 3.8  3.5 - 5.1 mmol/L Final  . Chloride 05/24/2019 102  98 - 111 mmol/L Final  . CO2 05/24/2019 24  22 - 32 mmol/L Final  . Glucose, Bld 05/24/2019 98  70 - 99 mg/dL Final  . BUN 05/24/2019 13  8 - 23 mg/dL Final  . Creatinine, Ser 05/24/2019 0.84  0.44 - 1.00 mg/dL Final  . Calcium 05/24/2019 9.2  8.9 - 10.3 mg/dL Final  . Total Protein 05/24/2019 7.4  6.5 - 8.1 g/dL Final  . Albumin 05/24/2019 4.2  3.5 - 5.0 g/dL Final  . AST 05/24/2019 25  15 - 41 U/L Final  . ALT 05/24/2019 17  0 - 44 U/L Final  . Alkaline Phosphatase 05/24/2019 82  38 - 126 U/L Final  . Total Bilirubin 05/24/2019 0.6  0.3 - 1.2 mg/dL Final  . GFR calc non Af Amer 05/24/2019 >60  >60 mL/min Final  . GFR calc Af Amer 05/24/2019 >60  >60 mL/min Final  . Anion gap 05/24/2019 9  5 - 15 Final   Performed at [facility], [address], [place], [place] [address]  . WBC 05/24/2019 4.7  4.0 - 10.5 K/uL Final  . RBC 05/24/2019 4.93  3.87 - 5.11 MIL/uL Final  . Hemoglobin 05/24/2019 14.6  12.0 - 15.0 g/dL Final  . HCT 05/24/2019 43.0  36.0 - 46.0 % Final  . MCV 05/24/2019 87.2  80.0 - 100.0 fL Final  . MCH 05/24/2019 29.6  26.0 - 34.0 pg Final  . MCHC 05/24/2019 34.0  30.0 -  36.0 g/dL Final  . RDW 05/24/2019 12.7  11.5 - 15.5 % Final  . Platelets 05/24/2019 204  150 - 400 K/uL Final  . nRBC 05/24/2019 0.0  0.0 - 0.2 % Final  . Neutrophils Relative % 05/24/2019 63  % Final  . Neutro Abs 05/24/2019 3.0  1.7 - 7.7 K/uL Final  . Lymphocytes Relative 05/24/2019 25  % Final  . Lymphs Abs 05/24/2019 1.2  0.7 - 4.0 K/uL Final  . Monocytes Relative 05/24/2019 9  % Final  . Monocytes Absolute 05/24/2019 0.4  0.1 - 1.0 K/uL Final  . Eosinophils Relative 05/24/2019 2  % Final  . Eosinophils Absolute 05/24/2019 0.1  0.0 - 0.5 K/uL Final  . Basophils Relative 05/24/2019 1  % Final  . Basophils Absolute 05/24/2019 0.1  0.0 - 0.1 K/uL Final  . Immature Granulocytes 05/24/2019 0  % Final  . Abs Immature Granulocytes 05/24/2019 0.01  0.00 - 0.07 K/uL Final   Performed at [facility], [address], [place], [place] [address]    Assessment:  Humaira Sculley is a 63 y.o. female with clinical stage [id number] right breast cancer and DCIS in the left breast s/p neoadjuvant chemotherapy followed by left simple mastectomy and right modified radical mastectomy on 07/10/2018.  Left breast pathology revealed 2.8 cm residual high grade DCIS, comedo type.  There was atypical lobular hyperplasia.  Four sentinel lymph nodes were negative.  Right breast pathology revealed 3.6 cm residual grade II invasive mammary carcinoma and high grade DCIS.  There was lymphovascular invasion.There was metastatic carcinoma in 2 sentinel lymph nodes.  Right axillary dissection revealed metastatic carcinoma in 7 lymph nodes.  There were a total of 9 lymph nodes with macrometastasis (largest 22 mm).  Pathologic stage was ypT2 ypN2a.  She underwent ultrasound guided biopsy on 10/29/2017. Pathology from the right breast at the 10 o'clock position 3 cm from the nipple revealed a 1.4 cm grade II invasive mammary carcinoma of no special type.  Tumor was ER + (> 90%), PR + (90%) and Her2/neu 1+.  Right  axillary node biopsy revealed metastatic carcinoma.  Left breast biopsy at the 2 o'clock position 3 cm from the nipple revealed high grade DCIS with comedonecrosis.   She received 4 cycles of neoadjuvant AC (12/05/2017 - 01/30/2018) with OnPro Neulasta support.  She received 12 weeks of neoadjuvant Taxol (02/13/2018 - 02/27/2018; 04/10/2018 - 06/05/2018).  She received radiation from 09/23/2018 - 11/23/2018.  She began letrozole on 01/06/2019.  CA27.29 has been followed:  23.5 on 11/14/2017, < 3.5 on 08/27/2018, 7.4 on 01/06/2019, and 9.2 on 05/24/2019.  Bone density on 12/16/2016 revealed osteopenia with a T-score of -1.6 in the LEFT femoral neck on 12/16/2017.  Bone density on 11/24/2018 revealed osteopenia with a T-score of -2.1 in the LEFT femoral neck.  She developed a right axillary abscess and underwent incision and drainage on 03/12/2018.  Culture grew staph aureus.  She completed Bactrim on 03/26/2018.  Symptomatically, she has increased neuropathy and right upper extremity lymphedema.  She denies any breast concerns.  Plan: 1.   Labs today:  CBC with diff, CMP, CA27.29.  2.   Stage IIIB RIGHT breast cancer  Clinically, she is doing well.  She is status post bilateral mastectomy.  Reconstruction being  considered 6 months after completion of radiation.   Referral to Dr. Audelia Hives.  She is taking letrozole.  He enrolled on the Alliance [phone] ([name] trial).   Randomized phase III double blinded placebo controlled study comparing the effects of adjuvant daily aspirin (300 mg) vs.    placebo on invasive disease free survival (iDFS) rate in patients with stage II-III Her2/neu (-) breast cancer. 3.   Chemotherapy induced neuropathy Neuropathy more problematic. Add B12 and folate.  Rx gabapentin. 4.   Osteopenia Bone density on 12/2018 confirmed osteopenia Continue calcium and vitamin D. Consider Zometa after dental clearance. Dentist on phone to ensure Zometa clearance  today-no Zometa. 5.   Lymphedema  Referral to lymphedema clinic. 6.   RTC in 3 months for MD assessment, port flush, labs (CBC with diff, CMP, CA 27.29) and Zometa.  I discussed the assessment and treatment plan with the patient.  The patient was provided an opportunity to ask questions and all were answered.  The patient agreed with the plan and demonstrated an understanding of the instructions.  The patient was advised to call back if the symptoms worsen or if the condition fails to improve as anticipated.   Lequita Asal, MD, PhD    05/24/2019, 2:21 PM  I, Molly Dorshimer, am acting as Education administrator for [employer]. Mike Gip, MD, PhD.  I, Melissa C. Mike Gip, MD, have reviewed the above documentation for accuracy and completeness, and I agree with the above.

## 2019-05-24 ENCOUNTER — Telehealth: Payer: Self-pay

## 2019-05-24 ENCOUNTER — Other Ambulatory Visit: Payer: Self-pay

## 2019-05-24 ENCOUNTER — Inpatient Hospital Stay: Payer: 59

## 2019-05-24 ENCOUNTER — Other Ambulatory Visit: Payer: 59

## 2019-05-24 ENCOUNTER — Other Ambulatory Visit: Payer: Self-pay | Admitting: Hematology and Oncology

## 2019-05-24 ENCOUNTER — Encounter: Payer: Self-pay | Admitting: Hematology and Oncology

## 2019-05-24 ENCOUNTER — Inpatient Hospital Stay: Payer: 59 | Attending: Hematology and Oncology | Admitting: Hematology and Oncology

## 2019-05-24 VITALS — BP 119/84 | HR 76 | Temp 97.9°F | Resp 16 | Wt 157.0 lb

## 2019-05-24 DIAGNOSIS — G620 Drug-induced polyneuropathy: Secondary | ICD-10-CM

## 2019-05-24 DIAGNOSIS — Z923 Personal history of irradiation: Secondary | ICD-10-CM

## 2019-05-24 DIAGNOSIS — Z9221 Personal history of antineoplastic chemotherapy: Secondary | ICD-10-CM

## 2019-05-24 DIAGNOSIS — Z79811 Long term (current) use of aromatase inhibitors: Secondary | ICD-10-CM | POA: Diagnosis not present

## 2019-05-24 DIAGNOSIS — Z85828 Personal history of other malignant neoplasm of skin: Secondary | ICD-10-CM | POA: Diagnosis not present

## 2019-05-24 DIAGNOSIS — Z170 Estrogen receptor positive status [ER+]: Secondary | ICD-10-CM | POA: Diagnosis not present

## 2019-05-24 DIAGNOSIS — T451X5A Adverse effect of antineoplastic and immunosuppressive drugs, initial encounter: Secondary | ICD-10-CM

## 2019-05-24 DIAGNOSIS — Z800 Family history of malignant neoplasm of digestive organs: Secondary | ICD-10-CM | POA: Insufficient documentation

## 2019-05-24 DIAGNOSIS — M8580 Other specified disorders of bone density and structure, unspecified site: Secondary | ICD-10-CM | POA: Diagnosis not present

## 2019-05-24 DIAGNOSIS — Z79899 Other long term (current) drug therapy: Secondary | ICD-10-CM | POA: Diagnosis not present

## 2019-05-24 DIAGNOSIS — Z801 Family history of malignant neoplasm of trachea, bronchus and lung: Secondary | ICD-10-CM

## 2019-05-24 DIAGNOSIS — Z87891 Personal history of nicotine dependence: Secondary | ICD-10-CM

## 2019-05-24 DIAGNOSIS — C50411 Malignant neoplasm of upper-outer quadrant of right female breast: Secondary | ICD-10-CM

## 2019-05-24 DIAGNOSIS — R609 Edema, unspecified: Secondary | ICD-10-CM

## 2019-05-24 DIAGNOSIS — M25539 Pain in unspecified wrist: Secondary | ICD-10-CM | POA: Insufficient documentation

## 2019-05-24 DIAGNOSIS — I890 Lymphedema, not elsewhere classified: Secondary | ICD-10-CM | POA: Diagnosis not present

## 2019-05-24 DIAGNOSIS — M85852 Other specified disorders of bone density and structure, left thigh: Secondary | ICD-10-CM

## 2019-05-24 DIAGNOSIS — D0512 Intraductal carcinoma in situ of left breast: Secondary | ICD-10-CM

## 2019-05-24 LAB — COMPREHENSIVE METABOLIC PANEL
ALT: 17 U/L (ref 0–44)
AST: 25 U/L (ref 15–41)
Albumin: 4.2 g/dL (ref 3.5–5.0)
Alkaline Phosphatase: 82 U/L (ref 38–126)
Anion gap: 9 (ref 5–15)
BUN: 13 mg/dL (ref 8–23)
CO2: 24 mmol/L (ref 22–32)
Calcium: 9.2 mg/dL (ref 8.9–10.3)
Chloride: 102 mmol/L (ref 98–111)
Creatinine, Ser: 0.84 mg/dL (ref 0.44–1.00)
GFR calc Af Amer: >60 mL/min (ref >60)
GFR calc non Af Amer: >60 mL/min (ref >60)
Glucose, Bld: 98 mg/dL (ref 70–99)
Potassium: 3.8 mmol/L (ref 3.5–5.1)
Sodium: 135 mmol/L (ref 135–145)
Total Bilirubin: 0.6 mg/dL (ref 0.3–1.2)
Total Protein: 7.4 g/dL (ref 6.5–8.1)

## 2019-05-24 LAB — CBC WITH DIFFERENTIAL/PLATELET
Abs Immature Granulocytes: 0.01 10*3/uL (ref 0.00–0.07)
Basophils Absolute: 0.1 10*3/uL (ref 0.0–0.1)
Basophils Relative: 1 %
Eosinophils Absolute: 0.1 10*3/uL (ref 0.0–0.5)
Eosinophils Relative: 2 %
HCT: 43 % (ref 36.0–46.0)
Hemoglobin: 14.6 g/dL (ref 12.0–15.0)
Immature Granulocytes: 0 %
Lymphocytes Relative: 25 %
Lymphs Abs: 1.2 10*3/uL (ref 0.7–4.0)
MCH: 29.6 pg (ref 26.0–34.0)
MCHC: 34 g/dL (ref 30.0–36.0)
MCV: 87.2 fL (ref 80.0–100.0)
Monocytes Absolute: 0.4 10*3/uL (ref 0.1–1.0)
Monocytes Relative: 9 %
Neutro Abs: 3 10*3/uL (ref 1.7–7.7)
Neutrophils Relative %: 63 %
Platelets: 204 10*3/uL (ref 150–400)
RBC: 4.93 MIL/uL (ref 3.87–5.11)
RDW: 12.7 % (ref 11.5–15.5)
WBC: 4.7 10*3/uL (ref 4.0–10.5)
nRBC: 0 % (ref 0.0–0.2)

## 2019-05-24 LAB — FOLATE: Folate: 48 ng/mL (ref >5.9)

## 2019-05-24 LAB — VITAMIN B12: Vitamin B-12: 545 pg/mL (ref 180–914)

## 2019-05-24 MED ORDER — GABAPENTIN 100 MG PO CAPS: 100.0000 mg | ORAL_CAPSULE | Freq: Every day | ORAL | 1 refills | Status: DC

## 2019-05-24 MED ORDER — SODIUM CHLORIDE 0.9% FLUSH
10.0000 mL | INTRAVENOUS | Status: DC | PRN
  Administered 2019-05-24: 13:00:00 10 mL via INTRAVENOUS
  Filled 2019-05-24: qty 10

## 2019-05-24 MED ORDER — HEPARIN SOD (PORK) LOCK FLUSH 100 UNIT/ML IV SOLN
500.0000 [IU] | Freq: Once | INTRAVENOUS | Status: AC
  Administered 2019-05-24: 500 [IU] via INTRAVENOUS
  Filled 2019-05-24: qty 5

## 2019-05-24 NOTE — Telephone Encounter (Signed)
New patient referral faxed to [facility], Plastics. Confirmed fax received.

## 2019-05-24 NOTE — Progress Notes (Signed)
Pt here for follow up. Reports neuropathy seems to be getting worse in fingertips and bilateral feet. She would like to discuss Gabapentin again.

## 2019-05-25 LAB — CANCER ANTIGEN 27.29: CA 27.29: 9.2 U/mL (ref 0.0–38.6)

## 2019-05-27 ENCOUNTER — Telehealth: Payer: Self-pay | Admitting: Plastic Surgery

## 2019-05-27 NOTE — Telephone Encounter (Signed)

## 2019-05-28 ENCOUNTER — Other Ambulatory Visit: Payer: Self-pay

## 2019-05-28 ENCOUNTER — Encounter: Payer: Self-pay | Admitting: Plastic Surgery

## 2019-05-28 ENCOUNTER — Ambulatory Visit: Payer: 59 | Admitting: Plastic Surgery

## 2019-05-28 VITALS — BP 125/66 | HR 82 | Temp 97.6°F | Ht 65.0 in | Wt 156.0 lb

## 2019-05-28 DIAGNOSIS — C50411 Malignant neoplasm of upper-outer quadrant of right female breast: Secondary | ICD-10-CM

## 2019-05-30 ENCOUNTER — Encounter: Payer: Self-pay | Admitting: Plastic Surgery

## 2019-05-30 NOTE — Progress Notes (Signed)
Patient ID: Megan Vega, female    DOB: 1956-10-26, 63 y.o.   MRN: [id number]   Chief Complaint  Patient presents with  . Advice Only    for (B) breast reconstruction    The patient is a 63 year old female here for evaluation for breast reconstruction.  She underwent bilateral mastectomies in August 2019.  She then had radiation which ended in January 2020.  Her preoperative bra size was a 34 C.  She is 5 feet 5 inches tall and weighs 156 pounds.  She has been using prosthetics and feels like they are warm and heavy.  She gets aggravated about having to put them on.  She is interested in undergoing reconstruction.  She has some skin laxity.  Her scars are very well-healed.  There is no sign of seroma or hematoma.  She had more radiation on the right compared to the left.  She would like to be around the same size as she was preoperatively.  She does not have a lot of family support.   Review of Systems  Constitutional: Negative for activity change and appetite change.  HENT: Negative for dental problem.   Eyes: Negative.   Respiratory: Negative for chest tightness and shortness of breath.   Cardiovascular: Positive for leg swelling. Negative for chest pain.  Gastrointestinal: Negative for abdominal pain.  Endocrine: Negative.   Genitourinary: Negative.   Musculoskeletal: Positive for myalgias. Negative for neck pain.  Skin: Negative for pallor and wound.  Neurological: Negative.   Psychiatric/Behavioral: Negative for behavioral problems.    Past Medical History:  Diagnosis Date  . Ankle fracture   . Cancer [facility])    Basal Cell Carcinoma, about 2011 chest  . Cataract   . Family history of adverse reaction to anesthesia    DAUGHTER-N/V  . Hemorrhoids   . History of methicillin resistant staphylococcus aureus (MRSA) 2015  . Migraines    MIGRAINES  . Personal history of chemotherapy   . Personal history of radiation therapy     Past Surgical History:  Procedure  Laterality Date  . BREAST BIOPSY Bilateral 10/29/2017   L breast DCIS, R breast invasive mammary carcinoma of no special type, ER/PR+, Her2/neu 1+  . CESAREAN SECTION    . COLONOSCOPY  2012  . COLONOSCOPY WITH PROPOFOL N/A 12/01/2018   Procedure: COLONOSCOPY WITH PROPOFOL;  Surgeon: Lucilla Lame, MD;  Location: [facility] ENDOSCOPY;  Service: Endoscopy;  Laterality: N/A;  . FRACTURE SURGERY    . IRRIGATION AND DEBRIDEMENT ABSCESS Right 03/12/2018   Procedure: IRRIGATION AND DEBRIDEMENT RIGHT AXILLARY ABSCESS;  Surgeon: Robert Bellow, MD;  Location: [facility] ORS;  Service: General;  Laterality: Right;  . MANDIBLE FRACTURE SURGERY    . MASTECTOMY    . MASTECTOMY MODIFIED RADICAL Right 07/10/2018   ypT2 ypN2a ER/PR positive, HER-2/neu negative  Surgeon: Robert Bellow, MD;  Location: [facility] ORS;  Service: General;  Laterality: Right;  . PORTACATH PLACEMENT Left 11/28/2017   Procedure: INSERTION PORT-A-CATH;  Surgeon: Robert Bellow, MD;  Location: [facility] ORS;  Service: General;  Laterality: Left;  . SIMPLE MASTECTOMY WITH AXILLARY SENTINEL NODE BIOPSY Left 07/10/2018   High-grade DCIS SIMPLE MASTECTOMY;  Surgeon: Robert Bellow, MD;  Location: [facility] ORS;  Service: General;  Laterality: Left;      Current Outpatient Medications:  .  acetaminophen (TYLENOL) 500 MG tablet, Take 1,000 mg by mouth every 6 (six) hours as needed for moderate pain or headache., Disp: , Rfl:  .  aspirin-acetaminophen-caffeine (  EXCEDRIN MIGRAINE) 250-250-65 MG tablet, Take 1 tablet by mouth every 6 (six) hours as needed for headache or migraine., Disp: , Rfl:  .  b complex vitamins tablet, Take 1 tablet by mouth daily., Disp: , Rfl:  .  CALCIUM PO, Take 1 tablet by mouth daily. 1200 MG Tablets, Disp: , Rfl:  .  gabapentin (NEURONTIN) 100 MG capsule, Take 1 capsule (100 mg total) by mouth at bedtime., Disp: 30 capsule, Rfl: 1 .  letrozole (FEMARA) 2.5 MG tablet, Take 1 tablet (2.5 mg total) by mouth daily., Disp: 90  tablet, Rfl: 0 .  Melatonin 3 MG CAPS, Take 1 capsule by mouth daily., Disp: , Rfl:  .  Multiple Vitamin (MULTIVITAMIN WITH MINERALS) TABS tablet, Take 1 tablet by mouth daily., Disp: , Rfl:  .  mupirocin ointment (BACTROBAN) 2 %, Apply 1 application topically 2 (two) times daily. X 5 days to nose and bid x 7-14 days right leg, Disp: 30 g, Rfl: 1 .  polyethylene glycol (MIRALAX / GLYCOLAX) packet, Take 17 g by mouth daily as needed (for constipation.). , Disp: , Rfl:  .  Probiotic Product (PROBIOTIC PO), Take 1 capsule by mouth daily., Disp: , Rfl:  .  tetrahydrozoline 0.05 % ophthalmic solution, Place 1 drop into both eyes 3 (three) times daily as needed (for dry/red eyes.)., Disp: , Rfl:  No current facility-administered medications for this visit.   Facility-Administered Medications Ordered in Other Visits:  .  heparin lock flush 100 unit/mL, 500 Units, Intracatheter, PRN, Mike Gip, Melissa C, MD   Objective:   Vitals:   05/28/19 1259  BP: 125/66  Pulse: 82  Temp: 97.6 F (36.4 C)  SpO2: 95%    Physical Exam Vitals signs and nursing note reviewed.  Constitutional:      Appearance: Normal appearance.  HENT:     Head: Normocephalic and atraumatic.  Cardiovascular:     Rate and Rhythm: Normal rate.     Pulses: Normal pulses.  Pulmonary:     Effort: Pulmonary effort is normal. No respiratory distress.     Breath sounds: No wheezing.  Abdominal:     General: Abdomen is flat. There is no distension.  Musculoskeletal:        General: Swelling present. No deformity.  Skin:    General: Skin is warm.  Neurological:     General: No focal deficit present.     Mental Status: She is alert. Mental status is at baseline.  Psychiatric:        Mood and Affect: Mood normal.        Behavior: Behavior normal.        Thought Content: Thought content normal.    Assessment & Plan:  Primary cancer of upper outer quadrant of right breast ([place])  Assessment and Plan:  A long, detailed  conversation was had regarding the patient's options for breast reconstruction. Five main points, which are explained to all breast reconstruction patients, were discussed.  1. Breast reconstruction is an optional process.  2. Breast reconstruction is a multi-stage process which involves multiple surgeries spaced several months apart. The entire process can take over one year.  3. The major goal of breast reconstruction is to have the patient look normal in clothing. When naked, there will always be scars.  4. Asymmetries are often present during the reconstruction process. Several operations may be needed, including surgery to the non-cancerous breast, to achieve satisfactory results.  5. No matter the reconstructive method, there are ways that the  reconstruction can fail and a secondary reconstructive plan would need to be created.   A general discussion regarding all available methods of breast reconstruction were discussed. The types of reconstructions described included.  1. Tissue expander and implant based reconstruction, both single and multi-stage approaches.  2. Autologous only reconstructions, including free abdominal-tissue based reconstructions.  3. Combination procedures, particularly latissismus dorsi flaps combined with either expanders or implants.  For each of the reconstruction methods mentioned above, the risks, benefits, alternatives, scarring, and recovery time were discussed in great detail. Specific risks detailed included bleeding, infection, hematoma, seroma, scarring, pain, wound healing complications, flap loss, fat necrosis, capsular contracture, need for implant removal, donor site complications, bulge, hernia, umbilical necrosis, need for urgent reoperation, and need for dressing changes were discussed.   Assessment  Once all reconstruction options were presented, a focused discussion was had regarding the patient's suitability for each of these procedures.  A total of  50 minutes of face-to-face time was spent in this encounter, of which >50% was spent in counseling.  Due to the radiation this makes reconstruction a little more challenging for the patient.  She could try breast expanders with implants if she is willing to be a C or B.  Otherwise a TRAM may be a good idea for her except that she needs pretty small so she would end up with pretty small breasts from her abdomen.  She is going to think things over and I will give her a call this coming week.  Pictures were obtained of the patient and placed in the chart with the patient's or guardian's permission.  [place], DO

## 2019-05-31 ENCOUNTER — Encounter: Payer: Self-pay | Admitting: Occupational Therapy

## 2019-05-31 ENCOUNTER — Ambulatory Visit: Payer: 59 | Attending: Hematology and Oncology | Admitting: Occupational Therapy

## 2019-05-31 ENCOUNTER — Other Ambulatory Visit: Payer: Self-pay

## 2019-05-31 DIAGNOSIS — M6281 Muscle weakness (generalized): Secondary | ICD-10-CM | POA: Diagnosis present

## 2019-05-31 DIAGNOSIS — I972 Postmastectomy lymphedema syndrome: Secondary | ICD-10-CM | POA: Diagnosis present

## 2019-06-01 ENCOUNTER — Encounter: Payer: Self-pay | Admitting: Plastic Surgery

## 2019-06-04 NOTE — Therapy (Signed)
[facility] [address], [place], [address] Phone: [phone]   Fax:  [phone]  Occupational Therapy Evaluation  Patient Details  Name: Megan Vega MRN: [id number] Date of Birth: Aug 04, 1956 Referring Provider (OT): Katharina Caper   Encounter Date: 05/31/2019  OT End of Session - 06/04/19 1539    Visit Number  1    Number of Visits  8    Date for OT Re-Evaluation  07/26/19    OT Start Time  1300    OT Stop Time  1358    OT Time Calculation (min)  58 min    Activity Tolerance  Patient tolerated treatment well    Behavior During Therapy  [facility] for tasks assessed/performed       Past Medical History:  Diagnosis Date  . Ankle fracture   . Cancer [facility])    Basal Cell Carcinoma, about 2011 chest  . Cataract   . Family history of adverse reaction to anesthesia    DAUGHTER-N/V  . Hemorrhoids   . History of methicillin resistant staphylococcus aureus (MRSA) 2015  . Migraines    MIGRAINES  . Personal history of chemotherapy   . Personal history of radiation therapy     Past Surgical History:  Procedure Laterality Date  . BREAST BIOPSY Bilateral 10/29/2017   L breast DCIS, R breast invasive mammary carcinoma of no special type, ER/PR+, Her2/neu 1+  . CESAREAN SECTION    . COLONOSCOPY  2012  . COLONOSCOPY WITH PROPOFOL N/A 12/01/2018   Procedure: COLONOSCOPY WITH PROPOFOL;  Surgeon: Lucilla Lame, MD;  Location: [facility] ENDOSCOPY;  Service: Endoscopy;  Laterality: N/A;  . FRACTURE SURGERY    . IRRIGATION AND DEBRIDEMENT ABSCESS Right 03/12/2018   Procedure: IRRIGATION AND DEBRIDEMENT RIGHT AXILLARY ABSCESS;  Surgeon: Robert Bellow, MD;  Location: [facility] ORS;  Service: General;  Laterality: Right;  . MANDIBLE FRACTURE SURGERY    . MASTECTOMY    . MASTECTOMY MODIFIED RADICAL Right 07/10/2018   ypT2 ypN2a ER/PR positive, HER-2/neu negative  Surgeon: Robert Bellow, MD;  Location: [facility] ORS;  Service:  General;  Laterality: Right;  . PORTACATH PLACEMENT Left 11/28/2017   Procedure: INSERTION PORT-A-CATH;  Surgeon: Robert Bellow, MD;  Location: [facility] ORS;  Service: General;  Laterality: Left;  . SIMPLE MASTECTOMY WITH AXILLARY SENTINEL NODE BIOPSY Left 07/10/2018   High-grade DCIS SIMPLE MASTECTOMY;  Surgeon: Robert Bellow, MD;  Location: [facility] ORS;  Service: General;  Laterality: Left;    There were no vitals filed for this visit.  Subjective Assessment - 06/04/19 1538    Subjective   Patient reports she has noticed a little more swelling in her right arm compared to the left side.  She went to a class at the cancer center in March about lymphedema.    Pertinent History  Patient reports she was diagnosed in Nov 2018, chemo treatments first, bilateral mastectomy august 2019, last radiation treatment was Nov 19, 2018, unsure of how many treatments she had.  Has talked to plastic surgeon for recontruction surgery.  Unsure of how many lymph nodes she had removed, she remembers 9.  Chart indicates the following:BREAST, LEFT; SIMPLE MASTECTOMY: SENTINEL LYMPH NODES, LEFT; EXCISION: - FOUR LYMPH NODE NEGATIVE FOR MALIGNANCY (0/4).  C.  BREAST, RIGHT; SIMPLE MASTECTOMY: RESIDUAL INVASIVE MAMMARY CARCINOMA AND HIGH-GRADE DUCTAL CARCINOMA IN SITU, COMEDO TYPE. SENTINEL LYMPH NODES, RIGHT; EXCISION: - METASTATIC CARCINOMA INVOLVING TWO LYMPH NODES (2/2).    Patient Stated Goals  Patient reports she would like the right arm to look like the other arm.    Currently in Pain?  No/denies    Pain Score  0-No pain        [facility] OT Assessment - 06/04/19 1542      Assessment   Medical Diagnosis  lymphedema    Referring Provider (OT)  Mike Gip, M    Hand Dominance  Right      Precautions   Precaution Comments  no blood pressure in left arm       Home  Environment   Family/patient expects to be discharged to:  Private residence    Living Arrangements  Children    Available Help at Discharge  Family     Type of [place]  One level    Bathroom Shower/Tub  Tub/Shower unit    [employer]  Standard    Additional Comments  no adaptive equipment.      Lives With  Son      Prior Function   Level of Independence  Independent    Vocation  Full time employment    Research scientist (physical sciences) work, Teaching laboratory technician work, Hydrographic surveyor , Production designer, theatre/television/film.      Leisure  kayak before cancer, reading, likes to garden, likes hiking and walking.        ADL   ADL comments  Patient is independent with her basic self care tasks and IADLs currently      IADL   Prior Level of Function Shopping  independent    Shopping  Shops independently for small purchases    Prior Level of Function Light Housekeeping  independent    Light Housekeeping  Performs light daily tasks such as dishwashing, bed making    Prior Level of Function Meal Prep  independent    Meal Prep  Able to complete simple warm meal prep    Prior Level of Function Metallurgist own vehicle      Written Expression   Dominant Hand  Right      Cognition   Overall Cognitive Status  Within Functional Limits for tasks assessed      Sensation   Light Touch  Appears Intact    Stereognosis  Appears Intact    Hot/Cold  Appears Intact    Additional Comments   Numbness and tingling in both hands and feet      Coordination   Gross Motor Movements are Fluid and Coordinated  Yes    Fine Motor Movements are Fluid and Coordinated  Yes      Hand Function   Right Hand Grip (lbs)  56    Right Hand Lateral Pinch  15 lbs    Right Hand 3 Point Pinch  15 lbs    Left Hand Grip (lbs)  48    Left Hand Lateral Pinch  14 lbs    Left 3 point pinch  11 lbs       LYMPHEDEMA/ONCOLOGY QUESTIONNAIRE - 06/04/19 1604      Treatment   Past Chemotherapy Treatment  Yes    Past Radiation Treatment  Yes      What other symptoms do you have   Are you Having Heaviness or  Tightness  Yes    Are you having Pain  No    Are you having pitting edema  No    Is it  Hard or Difficult finding clothes that fit  No    Do you have infections  No      Lymphedema Assessments   Lymphedema Assessments  Upper extremities      Right Upper Extremity Lymphedema   At Axilla   33.5 cm    15 cm Proximal to Olecranon Process  33.2 cm    10 cm Proximal to Olecranon Process  32.2 cm    Olecranon Process  27 cm    15 cm Proximal to Ulnar Styloid Process  26.6 cm    10 cm Proximal to Ulnar Styloid Process  22.3 cm    Just Proximal to Ulnar Styloid Process  15.5 cm    Across Hand at [employer]  19.6 cm    At [place] of 2nd Digit  6.1 cm    At [place] of Thumb  6.3 cm      Left Upper Extremity Lymphedema   At Axilla   32.8 cm    15 cm Proximal to Olecranon Process  32 cm    10 cm Proximal to Olecranon Process  30.9 cm    Olecranon Process  25.6 cm    15 cm Proximal to Ulnar Styloid Process  23.5 cm    10 cm Proximal to Ulnar Styloid Process  21.4 cm    Just Proximal to Ulnar Styloid Process  15 cm    Across Hand at [employer]  19.5 cm    At [place] of 2nd Digit  5.8 cm    At [facility] of Thumb  5.5 cm      Patient reports neuropathy in bilateral hands and feet since cancer diagnosis and chemotherapy.  She tends to drop items frequently.   She is independent with all ADLs currently.   Has a port on the left side.  Patient reports heaviness in right arm, reports difficulty at times reaching behind her to wash her back.   Circumferential measurements around chest at axilla level 97.5 cm.  Numbness noted on right side and armpit.   Patient instructed this date on scar management techniques. Will need additional focus in this area.            OT Education - 06/04/19 1538    Education Details  role of OT, plan of care, goals    Person(s) Educated  Patient    Methods  Explanation    Comprehension  Verbalized understanding          OT Long Term Goals - 06/04/19  1610      OT LONG TERM GOAL #1   Title  Patient will  demonstrate independence in wear, care and schedule of use of compression sleeve to manage lymphedema of right arm.    Baseline  no current compression garments at this time.    Time  8    Period  Weeks    Status  New    Target Date  07/26/19      OT LONG TERM GOAL #2   Title  Patient will demonstrate understanding of manual lymphatic drainage techniques to manage symptoms of lymphedema.    Baseline  no current program    Time  8    Period  Weeks    Status  New    Target Date  07/26/19      OT LONG TERM GOAL #3   Title  Patient will be independent with home exercise program.    Baseline  no current program  Time  8    Period  Weeks    Status  New    Target Date  07/26/19            Plan - 06/04/19 1540    Clinical Impression Statement  Patient is a 63 yo female diagnosed with breast cancer and underwent bilateral mastectomies in 2019.  Patient had chemo prior to surgery and radiation afterwards with last radiation treatment in Jan 2020.  Patient presents with neuropathy in bilateral hands and feet since chemo treatments, reports heaviness in right UE, numbness on right side near axilla. With circumferential measurements, patient demonstrates increased edema in right hand and arm compared to left.  Patient would benefit from skilled OT services for lymphedema treatment program for compression, skin care, manual lymphatic drainage and exercise to manage the symptoms of lymphedema and maintain her level of independence.    OT Occupational Profile and History  Detailed Assessment- Review of Records and additional review of physical, cognitive, psychosocial history related to current functional performance    Occupational performance deficits (Please refer to evaluation for details):  ADL's;IADL's    Body Structure / Function / Physical Skills  ADL;UE functional use;Decreased knowledge of precautions;Decreased knowledge of use of  DME;IADL;Strength;Edema;ROM    Rehab Potential  Excellent    Clinical Decision Making  Limited treatment options, no task modification necessary    Comorbidities Affecting Occupational Performance:  May have comorbidities impacting occupational performance    Modification or Assistance to Complete Evaluation   No modification of tasks or assist necessary to complete eval    OT Frequency  1x / week    OT Duration  8 weeks    OT Treatment/Interventions  Self-care/ADL training;Therapeutic exercise;DME and/or AE instruction;Compression bandaging;Manual Therapy;Manual lymph drainage;Therapeutic activities;Patient/family education;Coping strategies training    Consulted and Agree with Plan of Care  Patient       Patient will benefit from skilled therapeutic intervention in order to improve the following deficits and impairments:   Body Structure / Function / Physical Skills: ADL, UE functional use, Decreased knowledge of precautions, Decreased knowledge of use of DME, IADL, Strength, Edema, ROM       Visit Diagnosis: 1. Postmastectomy lymphedema syndrome   2. Muscle weakness (generalized)       Problem List Patient Active Problem List   Diagnosis Date Noted  . Encounter for screening colonoscopy   . Benign neoplasm of ascending colon   . Benign neoplasm of transverse colon   . Screen for colon cancer 10/14/2018  . Prediabetes 10/14/2018  . Chemotherapy-induced neuropathy ([place]) 09/10/2018  . Seroma of breast 07/31/2018  . Folliculitis 16/08/9603  . Peripheral neuropathy due to chemotherapy ([place]) 05/16/2018  . Chemotherapy induced neutropenia ([place]) 04/06/2018  . Leucopenia 03/29/2018  . Hypokalemia 03/11/2018  . Axillary mass, right 03/10/2018  . Constipation 02/01/2018  . Chickenpox 01/30/2018  . Skin cancer of anterior chest 01/30/2018  . Hemorrhoids 01/30/2018  . Osteopenia of neck of left femur 12/19/2017  . Encounter for antineoplastic chemotherapy 12/05/2017  . Goals of  care, counseling/discussion 11/27/2017  . Primary cancer of upper outer quadrant of right breast ([place]) 11/14/2017  . Ductal carcinoma in situ (DCIS) of left breast 11/14/2017  . Cellulitis of left lower extremity 11/26/2016  . Hot flashes 08/02/2015  . Screening for thyroid disorder 08/02/2015  . Screening for diabetes mellitus 08/02/2015  . Screening, anemia, deficiency, iron 08/02/2015  . History of basal cell cancer 08/02/2015  . Migraines 08/02/2015  . Routine physical  examination 07/17/2015  . Dystonic tremor 05/23/2014   Jonah Nestle T Anayah Arvanitis, OTR/L, CLT  Rohaan Durnil 06/04/2019, 4:15 PM  [facility] [address], [place], [address] Phone: [phone]   Fax:  [phone]  Name: Gerri Acre MRN: [id number] Date of Birth: September 02, 1956

## 2019-06-08 ENCOUNTER — Encounter: Payer: Self-pay | Admitting: Hematology and Oncology

## 2019-06-08 ENCOUNTER — Encounter: Payer: Self-pay | Admitting: General Surgery

## 2019-06-09 ENCOUNTER — Ambulatory Visit: Payer: 59 | Admitting: Occupational Therapy

## 2019-06-09 ENCOUNTER — Other Ambulatory Visit: Payer: Self-pay

## 2019-06-09 DIAGNOSIS — I972 Postmastectomy lymphedema syndrome: Secondary | ICD-10-CM | POA: Diagnosis not present

## 2019-06-09 DIAGNOSIS — M6281 Muscle weakness (generalized): Secondary | ICD-10-CM

## 2019-06-09 NOTE — Patient Instructions (Addendum)
Pt to pick up over the counter compression sleeve at [facility] with gauntlet - and wear most all the time except sleeping

## 2019-06-09 NOTE — Therapy (Signed)
[place] [facility] [address], [place], [address] Phone: [phone]   Fax:  [phone]  Occupational Therapy Treatment  Patient Details  Name: Megan Vega MRN: [id number] Date of Birth: November 04, 1956 Referring Provider (OT): Katharina Caper   Encounter Date: 06/09/2019  OT End of Session - 06/09/19 1619    Visit Number  2    Number of Visits  8    Date for OT Re-Evaluation  07/26/19    OT Start Time  1414    OT Stop Time  1450    OT Time Calculation (min)  36 min    Activity Tolerance  Patient tolerated treatment well    Behavior During Therapy  [facility] for tasks assessed/performed       Past Medical History:  Diagnosis Date  . Ankle fracture   . Cancer [facility])    Basal Cell Carcinoma, about 2011 chest  . Cataract   . Family history of adverse reaction to anesthesia    DAUGHTER-N/V  . Hemorrhoids   . History of methicillin resistant staphylococcus aureus (MRSA) 2015  . Migraines    MIGRAINES  . Personal history of chemotherapy   . Personal history of radiation therapy     Past Surgical History:  Procedure Laterality Date  . BREAST BIOPSY Bilateral 10/29/2017   L breast DCIS, R breast invasive mammary carcinoma of no special type, ER/PR+, Her2/neu 1+  . CESAREAN SECTION    . COLONOSCOPY  2012  . COLONOSCOPY WITH PROPOFOL N/A 12/01/2018   Procedure: COLONOSCOPY WITH PROPOFOL;  Surgeon: Lucilla Lame, MD;  Location: [facility] ENDOSCOPY;  Service: Endoscopy;  Laterality: N/A;  . FRACTURE SURGERY    . IRRIGATION AND DEBRIDEMENT ABSCESS Right 03/12/2018   Procedure: IRRIGATION AND DEBRIDEMENT RIGHT AXILLARY ABSCESS;  Surgeon: Robert Bellow, MD;  Location: [facility] ORS;  Service: General;  Laterality: Right;  . MANDIBLE FRACTURE SURGERY    . MASTECTOMY    . MASTECTOMY MODIFIED RADICAL Right 07/10/2018   ypT2 ypN2a ER/PR positive, HER-2/neu negative  Surgeon: Robert Bellow, MD;  Location: [facility] ORS;  Service:  General;  Laterality: Right;  . PORTACATH PLACEMENT Left 11/28/2017   Procedure: INSERTION PORT-A-CATH;  Surgeon: Robert Bellow, MD;  Location: [facility] ORS;  Service: General;  Laterality: Left;  . SIMPLE MASTECTOMY WITH AXILLARY SENTINEL NODE BIOPSY Left 07/10/2018   High-grade DCIS SIMPLE MASTECTOMY;  Surgeon: Robert Bellow, MD;  Location: [facility] ORS;  Service: General;  Laterality: Left;    There were no vitals filed for this visit.  Subjective Assessment - 06/09/19 1457    Subjective   I come to that survivorship classes - and you were the last speaker we had before it was stopped with the [place] - my R arm is more swollen , bigger and feels heavy and tight at times - I work on computer at [employer], work in yard, [employer] the lawn and workout on bike with my arms too    Pertinent History  Patient reports she was diagnosed in Nov 2018, chemo treatments first, bilateral mastectomy august 2019, last radiation treatment was Nov 19, 2018, unsure of how many treatments she had.  Has talked to plastic surgeon for recontruction surgery.  Unsure of how many lymph nodes she had removed, she remembers 9.  Chart indicates the following:BREAST, LEFT; SIMPLE MASTECTOMY: SENTINEL LYMPH NODES, LEFT; EXCISION: - FOUR LYMPH NODE NEGATIVE FOR MALIGNANCY (0/4).  C.  BREAST, RIGHT; SIMPLE MASTECTOMY: RESIDUAL INVASIVE MAMMARY CARCINOMA AND  HIGH-GRADE DUCTAL CARCINOMA IN SITU, COMEDO TYPE. SENTINEL LYMPH NODES, RIGHT; EXCISION: - METASTATIC CARCINOMA INVOLVING TWO LYMPH NODES (2/2).    Patient Stated Goals  Patient reports she would like the right arm to look like the other arm.    Currently in Pain?  No/denies          LYMPHEDEMA/ONCOLOGY QUESTIONNAIRE - 06/09/19 1427      Right Upper Extremity Lymphedema   15 cm Proximal to Olecranon Process  33.4 cm    10 cm Proximal to Olecranon Process  31.2 cm    Olecranon Process  26.8 cm    15 cm Proximal to Ulnar Styloid Process  25.4 cm    10 cm Proximal to Ulnar Styloid  Process  22.5 cm    Just Proximal to Ulnar Styloid Process  15.8 cm    Across Hand at [employer]  19 cm    At [place] of 2nd Digit  6 cm    At [facility] of Thumb  6 cm       reassess R UE circumference - see flow sheet  forearm to upper arm increase by 1.1 cm to 1.9 cm compare to L UE   pt is R hand dominant - Pt to get over the counter compression sleeve and gauntlet from [employer] and wear during day - until next week - will reassess  Pt did not had swelling under arm or chest - do report she feels like pocket of swelling under arm - will cont to assess  will assess fit or sleeve next visit and circumference - if decrease  And teach self MLD  Ed pt on lymphedema , stages , precautions again this date                OT Education - 06/09/19 1618    Education Details  findings and POC - sleeve wearing          OT Long Term Goals - 06/04/19 1610      OT LONG TERM GOAL #1   Title  Patient will  demonstrate independence in wear, care and schedule of use of compression sleeve to manage lymphedema of right arm.    Baseline  no current compression garments at this time.    Time  8    Period  Weeks    Status  New    Target Date  07/26/19      OT LONG TERM GOAL #2   Title  Patient will demonstrate understanding of manual lymphatic drainage techniques to manage symptoms of lymphedema.    Baseline  no current program    Time  8    Period  Weeks    Status  New    Target Date  07/26/19      OT LONG TERM GOAL #3   Title  Patient will be independent with home exercise program.    Baseline  no current program    Time  8    Period  Weeks    Status  New    Target Date  07/26/19            Plan - 06/09/19 1619    Clinical Impression Statement  Pt R UE measurements are increase by 1.1 to 1.9 cm from forearm to upper arm compare to L - pt report heavy and full feeling in R UE at times - all signs of stage 1 lymphedema - would recommend for pt to get over the counter  compression  sleeve and gauntlet to wear for about 1-2 wks every day but not night time -and will reassess the next 2 wks if circumference decrease so she can only wear it with high risk activities- pt to follow up next week and teach pt self MLD    OT Occupational Profile and History  Detailed Assessment- Review of Records and additional review of physical, cognitive, psychosocial history related to current functional performance    Occupational performance deficits (Please refer to evaluation for details):  ADL's;IADL's    Body Structure / Function / Physical Skills  ADL;UE functional use;Decreased knowledge of precautions;Decreased knowledge of use of DME;IADL;Strength;Edema;ROM    Rehab Potential  Excellent    Clinical Decision Making  Limited treatment options, no task modification necessary    Comorbidities Affecting Occupational Performance:  May have comorbidities impacting occupational performance    Modification or Assistance to Complete Evaluation   No modification of tasks or assist necessary to complete eval    OT Frequency  1x / week    OT Duration  8 weeks    OT Treatment/Interventions  Self-care/ADL training;Therapeutic exercise;DME and/or AE instruction;Compression bandaging;Manual Therapy;Manual lymph drainage;Therapeutic activities;Patient/family education;Coping strategies training    Plan  assess fit of sleeve and gauntlet - assess circumference - MLD teach    OT Home Exercise Plan  see pt instruction    Consulted and Agree with Plan of Care  Patient       Patient will benefit from skilled therapeutic intervention in order to improve the following deficits and impairments:   Body Structure / Function / Physical Skills: ADL, UE functional use, Decreased knowledge of precautions, Decreased knowledge of use of DME, IADL, Strength, Edema, ROM       Visit Diagnosis: 1. Muscle weakness (generalized)   2. Postmastectomy lymphedema syndrome       Problem List Patient Active  Problem List   Diagnosis Date Noted  . Encounter for screening colonoscopy   . Benign neoplasm of ascending colon   . Benign neoplasm of transverse colon   . Screen for colon cancer 10/14/2018  . Prediabetes 10/14/2018  . Chemotherapy-induced neuropathy ([place]) 09/10/2018  . Seroma of breast 07/31/2018  . Folliculitis 99/35/7017  . Peripheral neuropathy due to chemotherapy ([place]) 05/16/2018  . Chemotherapy induced neutropenia ([place]) 04/06/2018  . Leucopenia 03/29/2018  . Hypokalemia 03/11/2018  . Axillary mass, right 03/10/2018  . Constipation 02/01/2018  . Chickenpox 01/30/2018  . Skin cancer of anterior chest 01/30/2018  . Hemorrhoids 01/30/2018  . Osteopenia of neck of left femur 12/19/2017  . Encounter for antineoplastic chemotherapy 12/05/2017  . Goals of care, counseling/discussion 11/27/2017  . Primary cancer of upper outer quadrant of right breast ([place]) 11/14/2017  . Ductal carcinoma in situ (DCIS) of left breast 11/14/2017  . Cellulitis of left lower extremity 11/26/2016  . Hot flashes 08/02/2015  . Screening for thyroid disorder 08/02/2015  . Screening for diabetes mellitus 08/02/2015  . Screening, anemia, deficiency, iron 08/02/2015  . History of basal cell cancer 08/02/2015  . Migraines 08/02/2015  . Routine physical examination 07/17/2015  . Dystonic tremor 05/23/2014    Rosalyn Gess OTR/L,CLT 06/09/2019, 4:24 PM  [place] [facility] [address] [place], [address] Phone: [phone]   Fax:  [phone]  Name: Shaune Malacara MRN: [id number] Date of Birth: [birth year]

## 2019-06-11 ENCOUNTER — Encounter: Payer: Self-pay | Admitting: Radiation Oncology

## 2019-06-11 ENCOUNTER — Telehealth: Payer: Self-pay | Admitting: Plastic Surgery

## 2019-06-11 ENCOUNTER — Ambulatory Visit
Admission: RE | Admit: 2019-06-11 | Discharge: 2019-06-11 | Disposition: A | Discharge status: Home / Self Care | Payer: 59 | Source: Ambulatory Visit | Attending: Radiation Oncology | Admitting: Radiation Oncology

## 2019-06-11 ENCOUNTER — Encounter: Payer: Self-pay | Admitting: Hematology and Oncology

## 2019-06-11 ENCOUNTER — Telehealth: Payer: Self-pay

## 2019-06-11 ENCOUNTER — Other Ambulatory Visit: Payer: Self-pay

## 2019-06-11 VITALS — BP 128/83 | HR 75 | Temp 97.0°F | Resp 18 | Wt 156.3 lb

## 2019-06-11 DIAGNOSIS — Z170 Estrogen receptor positive status [ER+]: Secondary | ICD-10-CM | POA: Diagnosis not present

## 2019-06-11 DIAGNOSIS — Z79811 Long term (current) use of aromatase inhibitors: Secondary | ICD-10-CM | POA: Insufficient documentation

## 2019-06-11 DIAGNOSIS — Z923 Personal history of irradiation: Secondary | ICD-10-CM | POA: Insufficient documentation

## 2019-06-11 DIAGNOSIS — Z9012 Acquired absence of left breast and nipple: Secondary | ICD-10-CM | POA: Insufficient documentation

## 2019-06-11 DIAGNOSIS — C50411 Malignant neoplasm of upper-outer quadrant of right female breast: Secondary | ICD-10-CM

## 2019-06-11 NOTE — Telephone Encounter (Signed)
Received call from patient following up from patient message that she sent on 06/01/2019.  She is requesting a call back to discuss options for reconstruction surgery.

## 2019-06-11 NOTE — Progress Notes (Signed)
Radiation Oncology Follow up Note  Name: Megan Vega   Date:   06/11/2019 MRN:  [id number] DOB: 1956/10/10    This 63 y.o. female presents to the clinic today for 38-monthfollow-up status post radiation therapy to her right chest wall peripheral emphatic's for stage IIIb invasive mammary carcinoma ER PR positive and patient status post bilateral mastectomies..Marland Kitchen REFERRING PROVIDER: ABurnard Hawthorne FNP  HPI: Patient is a 63year old female now about 6 months having completed radiation therapy to her right chest wall peripheral emphatic's for stage IIIb ER PR positive HER-2 negative invasive mammary carcinoma.  She has had bilateral mastectomies.  She is seen today in routine follow-up and is doing well she specifically denies any new chest wall masses or nodularity any swelling in her right upper extremity cough or bone pain..  She is currently on Femara tolerating that well without side effect.  COMPLICATIONS OF TREATMENT: none  FOLLOW UP COMPLIANCE: keeps appointments   PHYSICAL EXAM:  BP 128/83   Pulse 75   Temp (!) 97 F (36.1 C)   Resp 18   Wt 156 lb 4.9 oz (70.9 kg)   BMI 26.01 kg/m  Patient is status post bilateral mastectomies.  She has a Port-A-Cath in the left anterior chest wall.  No dominant mass or nodularity is noted in her on exam and her right mastectomy scar.  No axillary or supraclavicular adenopathy is appreciated.  Well-developed well-nourished patient in NAD. HEENT reveals PERLA, EOMI, discs not visualized.  Oral cavity is clear. No oral mucosal lesions are identified. Neck is clear without evidence of cervical or supraclavicular adenopathy. Lungs are clear to A&P. Cardiac examination is essentially unremarkable with regular rate and rhythm without murmur rub or thrill. Abdomen is benign with no organomegaly or masses noted. Motor sensory and DTR levels are equal and symmetric in the upper and lower extremities. Cranial nerves II through XII are grossly intact.  Proprioception is intact. No peripheral adenopathy or edema is identified. No motor or sensory levels are noted. Crude visual fields are within normal range.  RADIOLOGY RESULTS: No current films for review  PLAN: Present time patient is doing well with no evidence of disease.  Of asked to see her back in 6 months for follow-up.  She continues on Femara without side effect.  Patient knows to call with any concerns.  I would like to take this opportunity to thank you for allowing me to participate in the care of your patient..Noreene Filbert MD

## 2019-06-11 NOTE — Telephone Encounter (Signed)
Faxed over script for compression sleeve to [facility]. They faxed back a cover sheet stating that they don't do compression contents any more. I have called and informed the patient and she will look into another place due to insurance reasons. And get back in touch with the office to inform us where to send the script. The patient was understanding and agreeable.

## 2019-06-15 DIAGNOSIS — I890 Lymphedema, not elsewhere classified: Secondary | ICD-10-CM | POA: Insufficient documentation

## 2019-06-17 ENCOUNTER — Other Ambulatory Visit: Payer: Self-pay

## 2019-06-17 ENCOUNTER — Ambulatory Visit: Payer: 59 | Admitting: Occupational Therapy

## 2019-06-17 DIAGNOSIS — I972 Postmastectomy lymphedema syndrome: Secondary | ICD-10-CM | POA: Diagnosis not present

## 2019-06-17 DIAGNOSIS — M6281 Muscle weakness (generalized): Secondary | ICD-10-CM

## 2019-06-17 NOTE — Therapy (Signed)
[place] [facility] [address], [place], [address] Phone: [phone]   Fax:  [phone]  Occupational Therapy Treatment  Patient Details  Name: Megan Vega MRN: [id number] Date of Birth: Feb 01, 1956 Referring Provider (OT): Katharina Caper   Encounter Date: 06/17/2019  OT End of Session - 06/17/19 1456    Visit Number  3    Number of Visits  8    Date for OT Re-Evaluation  07/26/19    OT Start Time  6811    OT Stop Time  1440    OT Time Calculation (min)  52 min    Activity Tolerance  Patient tolerated treatment well    Behavior During Therapy  [facility] for tasks assessed/performed       Past Medical History:  Diagnosis Date  . Ankle fracture   . Cancer [facility])    Basal Cell Carcinoma, about 2011 chest  . Cataract   . Family history of adverse reaction to anesthesia    DAUGHTER-N/V  . Hemorrhoids   . History of methicillin resistant staphylococcus aureus (MRSA) 2015  . Migraines    MIGRAINES  . Personal history of chemotherapy   . Personal history of radiation therapy     Past Surgical History:  Procedure Laterality Date  . BREAST BIOPSY Bilateral 10/29/2017   L breast DCIS, R breast invasive mammary carcinoma of no special type, ER/PR+, Her2/neu 1+  . CESAREAN SECTION    . COLONOSCOPY  2012  . COLONOSCOPY WITH PROPOFOL N/A 12/01/2018   Procedure: COLONOSCOPY WITH PROPOFOL;  Surgeon: Lucilla Lame, MD;  [facility]: [facility] ENDOSCOPY;  Service: Endoscopy;  Laterality: N/A;  . FRACTURE SURGERY    . IRRIGATION AND DEBRIDEMENT ABSCESS Right 03/12/2018   Procedure: IRRIGATION AND DEBRIDEMENT RIGHT AXILLARY ABSCESS;  Surgeon: Robert Bellow, MD;  Location: [facility] ORS;  Service: General;  Laterality: Right;  . MANDIBLE FRACTURE SURGERY    . MASTECTOMY    . MASTECTOMY MODIFIED RADICAL Right 07/10/2018   ypT2 ypN2a ER/PR positive, HER-2/neu negative  Surgeon: Robert Bellow, MD;  Location: [facility] ORS;  Service:  General;  Laterality: Right;  . PORTACATH PLACEMENT Left 11/28/2017   Procedure: INSERTION PORT-A-CATH;  Surgeon: Robert Bellow, MD;  Location: [facility] ORS;  Service: General;  Laterality: Left;  . SIMPLE MASTECTOMY WITH AXILLARY SENTINEL NODE BIOPSY Left 07/10/2018   High-grade DCIS SIMPLE MASTECTOMY;  Surgeon: Robert Bellow, MD;  Location: [facility] ORS;  Service: General;  Laterality: Left;    There were no vitals filed for this visit.  Subjective Assessment - 06/17/19 1454    Subjective   I am having hard time getting a place that is in network with my insurance to get my compression sleeve and gauntlet - and the sleeve is about $90    Pertinent History  Patient reports she was diagnosed in Nov 2018, chemo treatments first, bilateral mastectomy august 2019, last radiation treatment was Nov 19, 2018, unsure of how many treatments she had.  Has talked to plastic surgeon for recontruction surgery.  Unsure of how many lymph nodes she had removed, she remembers 9.  Chart indicates the following:BREAST, LEFT; SIMPLE MASTECTOMY: SENTINEL LYMPH NODES, LEFT; EXCISION: - FOUR LYMPH NODE NEGATIVE FOR MALIGNANCY (0/4).  C.  BREAST, RIGHT; SIMPLE MASTECTOMY: RESIDUAL INVASIVE MAMMARY CARCINOMA AND HIGH-GRADE DUCTAL CARCINOMA IN SITU, COMEDO TYPE. SENTINEL LYMPH NODES, RIGHT; EXCISION: - METASTATIC CARCINOMA INVOLVING TWO LYMPH NODES (2/2).    Patient Stated Goals  Patient reports she  would like the right arm to look like the other arm.    Currently in Pain?  No/denies          LYMPHEDEMA/ONCOLOGY QUESTIONNAIRE - 06/17/19 1415      Right Upper Extremity Lymphedema   15 cm Proximal to Olecranon Process  33 cm    10 cm Proximal to Olecranon Process  30.4 cm    Olecranon Process  27 cm    15 cm Proximal to Ulnar Styloid Process  25.5 cm    10 cm Proximal to Ulnar Styloid Process  22.5 cm    Just Proximal to Ulnar Styloid Process  16 cm    Across Hand at [employer]  19 cm    At [place] of 2nd Digit   6 cm    At [facility] of Thumb  6 cm       measurement taken - decrease in upper arm - forearm and elbow still increase - but pt is R hand dominant  Pt ed on self MLD this date to do one time a day - hand out provided and review:         Do manual lymph drainage once each day to help decrease swelling.  This should take you about 20 minutes depending on the size of your limb. And skin on skin  For R  Arm:  . Hug yourself at the base of your neck and do 8 small circles, and 2 fingers behind clavicle 8 x  . Do 8 semicircles at L armpit and R groin . Pump across chest from R  to the L 8 times . Pump down the R side of trunk from armpit to groin 8 times . Pump up the outside of R upper arm 8 times, inside of upper arm to outside 8x, outside of upper arm again 8x  . Pump across chest from R  to L 8 times . Pump down the R side of trunk from armpit to groin 8 times . Pump top of forearm from wrist to elbow 8 times . Pump up the outside of R upper arm 8 times . Pump across chest from R to L  8 times . Pump down the R side of trunk from armpit to groin 8 times . Pump up the back of the forearm from wrist to elbow 8 times . Pump up the outside of R upper arm 8 times . Pump across chest from R to L 8 times . Pump down the R side of trunk from armpit to groin 8 times . Do 8 semicircles at L armpit and R`  groin 8 times . Repeat nr.1  SLOW and LIGHT with only your palm NOT FINGERTIPS  Had to call [facility] for price of compression sleeve and gauntlet - then contact [place] approved they can cover cost of $130  Pt provided with note - and to stop at [facility] to get sleeve - they need to order gauntlet - but provided her isotoner glove to wear in the meantime   pt to follow up again in week         OT Education - 06/17/19 1455    Education Details  self MLD taught and wearing of compression    Person(s) Educated  Patient    Methods  Explanation    Comprehension   Verbalized understanding          OT Long Term Goals - 06/04/19 1610  OT LONG TERM GOAL #1   Title  Patient will  demonstrate independence in wear, care and schedule of use of compression sleeve to manage lymphedema of right arm.    Baseline  no current compression garments at this time.    Time  8    Period  Weeks    Status  New    Target Date  07/26/19      OT LONG TERM GOAL #2   Title  Patient will demonstrate understanding of manual lymphatic drainage techniques to manage symptoms of lymphedema.    Baseline  no current program    Time  8    Period  Weeks    Status  New    Target Date  07/26/19      OT LONG TERM GOAL #3   Title  Patient will be independent with home exercise program.    Baseline  no current program    Time  8    Period  Weeks    Status  New    Target Date  07/26/19            Plan - 06/17/19 1457    Clinical Impression Statement  Pt's circumference decrease in R upper arm compare to last time - pt having hard time finding a DME company that can be in network for her compression garments and gauntlet - contacted [employer] and they will cover it - pt to stop by [employer] [facility] to pick up sleeve - they need to order gauntlet - pt ed on doing self MLD to decrease circumference - foreram still increase    OT Occupational Profile and History  Detailed Assessment- Review of Records and additional review of physical, cognitive, psychosocial history related to current functional performance    Occupational performance deficits (Please refer to evaluation for details):  ADL's;IADL's    Body Structure / Function / Physical Skills  ADL;UE functional use;Decreased knowledge of precautions;Decreased knowledge of use of DME;IADL;Strength;Edema;ROM    Rehab Potential  Excellent    Clinical Decision Making  Limited treatment options, no task modification necessary    Comorbidities Affecting Occupational Performance:  May have comorbidities impacting  occupational performance    Modification or Assistance to Complete Evaluation   No modification of tasks or assist necessary to complete eval    OT Frequency  1x / week    OT Duration  8 weeks    OT Treatment/Interventions  Self-care/ADL training;Therapeutic exercise;DME and/or AE instruction;Compression bandaging;Manual Therapy;Manual lymph drainage;Therapeutic activities;Patient/family education;Coping strategies training    Plan  assess fit of sleeve and gauntlet - assess circumference -how she is doing with self  MLD    OT Home Exercise Plan  see pt instruction    Consulted and Agree with Plan of Care  Patient       Patient will benefit from skilled therapeutic intervention in order to improve the following deficits and impairments:   Body Structure / Function / Physical Skills: ADL, UE functional use, Decreased knowledge of precautions, Decreased knowledge of use of DME, IADL, Strength, Edema, ROM       Visit Diagnosis: 1. Postmastectomy lymphedema syndrome   2. Muscle weakness (generalized)       Problem List Patient Active Problem List   Diagnosis Date Noted  . Lymphedema 06/15/2019  . Encounter for screening colonoscopy   . Benign neoplasm of ascending colon   . Benign neoplasm of transverse colon   . Screen for colon cancer 10/14/2018  . Prediabetes  10/14/2018  . Chemotherapy-induced neuropathy ([place]) 09/10/2018  . Seroma of breast 07/31/2018  . Folliculitis 74/71/8550  . Peripheral neuropathy due to chemotherapy ([place]) 05/16/2018  . Chemotherapy induced neutropenia ([place]) 04/06/2018  . Leucopenia 03/29/2018  . Hypokalemia 03/11/2018  . Axillary mass, right 03/10/2018  . Constipation 02/01/2018  . Chickenpox 01/30/2018  . Skin cancer of anterior chest 01/30/2018  . Hemorrhoids 01/30/2018  . Osteopenia of neck of left femur 12/19/2017  . Encounter for antineoplastic chemotherapy 12/05/2017  . Goals of care, counseling/discussion 11/27/2017  . Primary cancer of  upper outer quadrant of right breast ([place]) 11/14/2017  . Ductal carcinoma in situ (DCIS) of left breast 11/14/2017  . Cellulitis of left lower extremity 11/26/2016  . Hot flashes 08/02/2015  . Screening for thyroid disorder 08/02/2015  . Screening for diabetes mellitus 08/02/2015  . Screening, anemia, deficiency, iron 08/02/2015  . History of basal cell cancer 08/02/2015  . Migraines 08/02/2015  . Routine physical examination 07/17/2015  . Dystonic tremor 05/23/2014    Rosalyn Gess OTR/L,CLT 06/17/2019, 2:59 PM  [place] [facility] [address] [place], [address] Phone: [phone]   Fax:  [phone]  Name: Dasie Chancellor MRN: [id number] Date of Birth: 1956-09-22

## 2019-06-24 ENCOUNTER — Ambulatory Visit: Payer: 59 | Attending: Hematology and Oncology | Admitting: Occupational Therapy

## 2019-06-24 ENCOUNTER — Other Ambulatory Visit: Payer: Self-pay

## 2019-06-24 DIAGNOSIS — M6281 Muscle weakness (generalized): Secondary | ICD-10-CM | POA: Diagnosis present

## 2019-06-24 DIAGNOSIS — I972 Postmastectomy lymphedema syndrome: Secondary | ICD-10-CM | POA: Insufficient documentation

## 2019-06-24 NOTE — Therapy (Signed)
[facility] [address] [place], [address] Phone: [phone]   Fax:  [phone]  Occupational Therapy Treatment  Patient Details  Name: Megan Vega MRN: [id number] Date of Birth: 03-Jun-1956 Referring Provider (OT): Katharina Caper   Encounter Date: 06/24/2019  OT End of Session - 06/24/19 1729    Visit Number  4    Number of Visits  8    Date for OT Re-Evaluation  07/26/19    OT Start Time  1543    OT Stop Time  1605    OT Time Calculation (min)  22 min    Activity Tolerance  Patient tolerated treatment well    Behavior During Therapy  [facility] for tasks assessed/performed       Past Medical History:  Diagnosis Date  . Ankle fracture   . Cancer [facility])    Basal Cell Carcinoma, about 2011 chest  . Cataract   . Family history of adverse reaction to anesthesia    DAUGHTER-N/V  . Hemorrhoids   . History of methicillin resistant staphylococcus aureus (MRSA) 2015  . Migraines    MIGRAINES  . Personal history of chemotherapy   . Personal history of radiation therapy     Past Surgical History:  Procedure Laterality Date  . BREAST BIOPSY Bilateral 10/29/2017   L breast DCIS, R breast invasive mammary carcinoma of no special type, ER/PR+, Her2/neu 1+  . CESAREAN SECTION    . COLONOSCOPY  2012  . COLONOSCOPY WITH PROPOFOL N/A 12/01/2018   Procedure: COLONOSCOPY WITH PROPOFOL;  Surgeon: Lucilla Lame, MD;  Location: [facility] ENDOSCOPY;  Service: Endoscopy;  Laterality: N/A;  . FRACTURE SURGERY    . IRRIGATION AND DEBRIDEMENT ABSCESS Right 03/12/2018   Procedure: IRRIGATION AND DEBRIDEMENT RIGHT AXILLARY ABSCESS;  Surgeon: Robert Bellow, MD;  Location: [facility] ORS;  Service: General;  Laterality: Right;  . MANDIBLE FRACTURE SURGERY    . MASTECTOMY    . MASTECTOMY MODIFIED RADICAL Right 07/10/2018   ypT2 ypN2a ER/PR positive, HER-2/neu negative  Surgeon: Robert Bellow, MD;  Location: [facility] ORS;  Service:  General;  Laterality: Right;  . PORTACATH PLACEMENT Left 11/28/2017   Procedure: INSERTION PORT-A-CATH;  Surgeon: Robert Bellow, MD;  Location: [facility] ORS;  Service: General;  Laterality: Left;  . SIMPLE MASTECTOMY WITH AXILLARY SENTINEL NODE BIOPSY Left 07/10/2018   High-grade DCIS SIMPLE MASTECTOMY;  Surgeon: Robert Bellow, MD;  Location: [facility] ORS;  Service: General;  Laterality: Left;    There were no vitals filed for this visit.  Subjective Assessment - 06/24/19 1639    Subjective   I did get my sleeve and it feels okay - and did get the guantlet - feels okay- wearing it the whole day    Pertinent History  Patient reports she was diagnosed in Nov 2018, chemo treatments first, bilateral mastectomy august 2019, last radiation treatment was Nov 19, 2018, unsure of how many treatments she had.  Has talked to plastic surgeon for recontruction surgery.  Unsure of how many lymph nodes she had removed, she remembers 9.  Chart indicates the following:BREAST, LEFT; SIMPLE MASTECTOMY: SENTINEL LYMPH NODES, LEFT; EXCISION: - FOUR LYMPH NODE NEGATIVE FOR MALIGNANCY (0/4).  C.  BREAST, RIGHT; SIMPLE MASTECTOMY: RESIDUAL INVASIVE MAMMARY CARCINOMA AND HIGH-GRADE DUCTAL CARCINOMA IN SITU, COMEDO TYPE. SENTINEL LYMPH NODES, RIGHT; EXCISION: - METASTATIC CARCINOMA INVOLVING TWO LYMPH NODES (2/2).    Patient Stated Goals  Patient reports she would like the right arm to  look like the other arm.    Currently in Pain?  No/denies          LYMPHEDEMA/ONCOLOGY QUESTIONNAIRE - 06/24/19 1549      Right Upper Extremity Lymphedema   15 cm Proximal to Olecranon Process  32.5 cm    10 cm Proximal to Olecranon Process  30 cm    Olecranon Process  26.5 cm    15 cm Proximal to Ulnar Styloid Process  25 cm    10 cm Proximal to Ulnar Styloid Process  22.6 cm    Just Proximal to Ulnar Styloid Process  16 cm    Across Hand at [employer]  19 cm    At [place] of 2nd Digit  6 cm    At [facility] of Thumb  6.1 cm       Left Upper Extremity Lymphedema   15 cm Proximal to Olecranon Process  32.5 cm    10 cm Proximal to Olecranon Process  29.5 cm    Olecranon Process  25.5 cm    15 cm Proximal to Ulnar Styloid Process  24 cm    10 cm Proximal to Ulnar Styloid Process  20.7 cm    Just Proximal to Ulnar Styloid Process  15 cm    Across Hand at [employer]  18.3 cm         measurement taken - decrease in upper arm - forearm and wrist still increase - but pt is R hand dominant  Pt re ed on self MLD one time a day - hand out provided and review: last time         Do manual lymph drainage once each day to help decrease swelling.  This should take you about 20 minutes depending on the size of your limb. And skin on skin  For R  Arm:   Hug yourself at the base of your neck and do 8 small circles, and 2 fingers behind clavicle 8 x   Do 8 semicircles at L armpit and R groin  Pump across chest from R  to the L 8 times  Pump down the R side of trunk from armpit to groin 8 times  Pump up the outside of R upper arm 8 times, inside of upper arm to outside 8x, outside of upper arm again 8x   Pump across chest from R  to L 8 times  Pump down the R side of trunk from armpit to groin 8 times  Pump top of forearm from wrist to elbow 8 times  Pump up the outside of R upper arm 8 times  Pump across chest from R to L  8 times  Pump down the R side of trunk from armpit to groin 8 times  Pump up the back of the forearm from wrist to elbow 8 times  Pump up the outside of R upper arm 8 times  Pump across chest from R to L 8 times  Pump down the R side of trunk from armpit to groin 8 times  Do 8 semicircles at L armpit and R`          groin 8 times  Repeat nr.1  SLOW and LIGHT with only your palm NOT FINGERTIPS  Pt sleeve fits well - education done for pt to donn and doff - and wearing -  Also guantlet- but want pt to wear Isotoner glove until in 2 wks  And night time tubi     measurement  taken - decrease in upper arm - forearm and to wrist still increase - but pt is R hand dominant  Pt ed on self MLD this date to do one time a day - hand out provided and review:         Do manual lymph drainage once each day to help decrease swelling.  This should take you about 20 minutes depending on the size of your limb. And skin on skin  For R  Arm:   Hug yourself at the base of your neck and do 8 small circles, and 2 fingers behind clavicle 8 x   Do 8 semicircles at L armpit and R groin  Pump across chest from R  to the L 8 times  Pump down the R side of trunk from armpit to groin 8 times  Pump up the outside of R upper arm 8 times, inside of upper arm to outside 8x, outside of upper arm again 8x   Pump across chest from R  to L 8 times  Pump down the R side of trunk from armpit to groin 8 times  Pump top of forearm from wrist to elbow 8 times  Pump up the outside of R upper arm 8 times  Pump across chest from R to L  8 times  Pump down the R side of trunk from armpit to groin 8 times  Pump up the back of the forearm from wrist to elbow 8 times  Pump up the outside of R upper arm 8 times  Pump across chest from R to L 8 times  Pump down the R side of trunk from armpit to groin 8 times  Do 8 semicircles at L armpit and R`          groin 8 times  Repeat nr.1  SLOW and LIGHT with only your palm NOT FINGERTIPS  Ha Assess fit of compression sleeve - fitting well  ed pt on donning and doffing and wearing of sleeve -  Pt to wear gauntlet some - but stretch it little when washing  And wear isotoner glove and tubigrip soft night time for hand to elbow          OT Education - 06/24/19 1642    Education Details  cont with MLD and wearing of compression    Person(s) Educated  Patient    Methods  Explanation    Comprehension  Verbalized understanding          OT Long Term Goals - 06/04/19 1610      OT LONG TERM GOAL #1   Title   Patient will  demonstrate independence in wear, care and schedule of use of compression sleeve to manage lymphedema of right arm.    Baseline  no current compression garments at this time.    Time  8    Period  Weeks    Status  New    Target Date  07/26/19      OT LONG TERM GOAL #2   Title  Patient will demonstrate understanding of manual lymphatic drainage techniques to manage symptoms of lymphedema.    Baseline  no current program    Time  8    Period  Weeks    Status  New    Target Date  07/26/19      OT LONG TERM GOAL #3   Title  Patient will be independent with home exercise program.    Baseline  no current program  Time  8    Period  Weeks    Status  New    Target Date  07/26/19            Plan - 06/24/19 1730    Clinical Impression Statement  Pt circumference decrease again in R UE - and was wearing her compression sleeve and gauntlet - tolerating well - forearm and wrist still increase compare to L - pt to wear night time isotoner glove and isotoner glove - will reassess in 2 wks    OT Occupational Profile and History  Detailed Assessment- Review of Records and additional review of physical, cognitive, psychosocial history related to current functional performance    Occupational performance deficits (Please refer to evaluation for details):  ADL's;IADL's    Body Structure / Function / Physical Skills  ADL;UE functional use;Decreased knowledge of precautions;Decreased knowledge of use of DME;IADL;Strength;Edema;ROM    Rehab Potential  Excellent    Clinical Decision Making  Limited treatment options, no task modification necessary    Comorbidities Affecting Occupational Performance:  May have comorbidities impacting occupational performance    Modification or Assistance to Complete Evaluation   No modification of tasks or assist necessary to complete eval    OT Frequency  Biweekly    OT Duration  6 weeks    OT Treatment/Interventions  Self-care/ADL  training;Therapeutic exercise;DME and/or AE instruction;Compression bandaging;Manual Therapy;Manual lymph drainage;Therapeutic activities;Patient/family education;Coping strategies training    Plan  assess tolerance for compression - assess circumference -how she is doing with self  MLD    OT Home Exercise Plan  see pt instruction    Consulted and Agree with Plan of Care  Patient       Patient will benefit from skilled therapeutic intervention in order to improve the following deficits and impairments:   Body Structure / Function / Physical Skills: ADL, UE functional use, Decreased knowledge of precautions, Decreased knowledge of use of DME, IADL, Strength, Edema, ROM       Visit Diagnosis: 1. Postmastectomy lymphedema syndrome   2. Muscle weakness (generalized)       Problem List Patient Active Problem List   Diagnosis Date Noted  . Lymphedema 06/15/2019  . Encounter for screening colonoscopy   . Benign neoplasm of ascending colon   . Benign neoplasm of transverse colon   . Screen for colon cancer 10/14/2018  . Prediabetes 10/14/2018  . Chemotherapy-induced neuropathy ([place]) 09/10/2018  . Seroma of breast 07/31/2018  . Folliculitis 40/98/1191  . Peripheral neuropathy due to chemotherapy ([place]) 05/16/2018  . Chemotherapy induced neutropenia ([place]) 04/06/2018  . Leucopenia 03/29/2018  . Hypokalemia 03/11/2018  . Axillary mass, right 03/10/2018  . Constipation 02/01/2018  . Chickenpox 01/30/2018  . Skin cancer of anterior chest 01/30/2018  . Hemorrhoids 01/30/2018  . Osteopenia of neck of left femur 12/19/2017  . Encounter for antineoplastic chemotherapy 12/05/2017  . Goals of care, counseling/discussion 11/27/2017  . Primary cancer of upper outer quadrant of right breast ([place]) 11/14/2017  . Ductal carcinoma in situ (DCIS) of left breast 11/14/2017  . Cellulitis of left lower extremity 11/26/2016  . Hot flashes 08/02/2015  . Screening for thyroid disorder 08/02/2015  .  Screening for diabetes mellitus 08/02/2015  . Screening, anemia, deficiency, iron 08/02/2015  . History of basal cell cancer 08/02/2015  . Migraines 08/02/2015  . Routine physical examination 07/17/2015  . Dystonic tremor 05/23/2014    Rosalyn Gess OTR/L,CLT 06/24/2019, 5:33 PM  [facility]  [address], [place], [address] Phone: [phone]   Fax:  [phone]  Name: Megan Vega MRN: [id number] Date of Birth: 17-Dec-1955

## 2019-06-24 NOTE — Patient Instructions (Signed)
Wearing sleeve and isotoner glove  And night time isotoner glove and glove with tubigrip soft to hand to elbow

## 2019-07-08 ENCOUNTER — Ambulatory Visit: Payer: 59 | Admitting: Occupational Therapy

## 2019-07-08 ENCOUNTER — Other Ambulatory Visit: Payer: Self-pay

## 2019-07-08 DIAGNOSIS — I972 Postmastectomy lymphedema syndrome: Secondary | ICD-10-CM

## 2019-07-08 DIAGNOSIS — M6281 Muscle weakness (generalized): Secondary | ICD-10-CM

## 2019-07-08 NOTE — Patient Instructions (Signed)
Wear daytime compression and see if need gauntlet - maybe just wear that or isotoner glove as needed Night time still isotoner glove and soft tubigrip to elbow

## 2019-07-08 NOTE — Therapy (Signed)
[facility] [address], [place], [address] Phone: [phone]   Fax:  [phone]  Occupational Therapy Treatment  Patient Details  Name: Megan Vega MRN: [id number] Date of Birth: 01/04/1956 Referring Provider (OT): Katharina Caper   Encounter Date: 07/08/2019  OT End of Session - 07/08/19 1544    Visit Number  5    Number of Visits  8    Date for OT Re-Evaluation  07/26/19    OT Start Time  1501    OT Stop Time  1525    OT Time Calculation (min)  24 min    Activity Tolerance  Patient tolerated treatment well    Behavior During Therapy  [facility] for tasks assessed/performed       Past Medical History:  Diagnosis Date  . Ankle fracture   . Cancer [facility])    Basal Cell Carcinoma, about 2011 chest  . Cataract   . Family history of adverse reaction to anesthesia    DAUGHTER-N/V  . Hemorrhoids   . History of methicillin resistant staphylococcus aureus (MRSA) 2015  . Migraines    MIGRAINES  . Personal history of chemotherapy   . Personal history of radiation therapy     Past Surgical History:  Procedure Laterality Date  . BREAST BIOPSY Bilateral 10/29/2017   L breast DCIS, R breast invasive mammary carcinoma of no special type, ER/PR+, Her2/neu 1+  . CESAREAN SECTION    . COLONOSCOPY  2012  . COLONOSCOPY WITH PROPOFOL N/A 12/01/2018   Procedure: COLONOSCOPY WITH PROPOFOL;  Surgeon: Lucilla Lame, MD;  Location: [facility] ENDOSCOPY;  Service: Endoscopy;  Laterality: N/A;  . FRACTURE SURGERY    . IRRIGATION AND DEBRIDEMENT ABSCESS Right 03/12/2018   Procedure: IRRIGATION AND DEBRIDEMENT RIGHT AXILLARY ABSCESS;  Surgeon: Robert Bellow, MD;  Location: [facility] ORS;  Service: General;  Laterality: Right;  . MANDIBLE FRACTURE SURGERY    . MASTECTOMY    . MASTECTOMY MODIFIED RADICAL Right 07/10/2018   ypT2 ypN2a ER/PR positive, HER-2/neu negative  Surgeon: Robert Bellow, MD;  Location: [facility] ORS;  Service:  General;  Laterality: Right;  . PORTACATH PLACEMENT Left 11/28/2017   Procedure: INSERTION PORT-A-CATH;  Surgeon: Robert Bellow, MD;  Location: [facility] ORS;  Service: General;  Laterality: Left;  . SIMPLE MASTECTOMY WITH AXILLARY SENTINEL NODE BIOPSY Left 07/10/2018   High-grade DCIS SIMPLE MASTECTOMY;  Surgeon: Robert Bellow, MD;  Location: [facility] ORS;  Service: General;  Laterality: Left;    There were no vitals filed for this visit.  Subjective Assessment - 07/08/19 1533    Subjective   My sleeve is doing well - but the gauntlet I do not like - feels so tight around my knuckles - but I did not wash and stretch it yet    Patient Stated Goals  Patient reports she would like the right arm to look like the other arm.    Currently in Pain?  No/denies          LYMPHEDEMA/ONCOLOGY QUESTIONNAIRE - 07/08/19 1511      Right Upper Extremity Lymphedema   15 cm Proximal to Olecranon Process  32.8 cm    10 cm Proximal to Olecranon Process  30 cm    Olecranon Process  26 cm    15 cm Proximal to Ulnar Styloid Process  25.5 cm    10 cm Proximal to Ulnar Styloid Process  22.8 cm    Just Proximal to Ulnar Styloid Process  15.5 cm    Across Hand at [employer]  18.4 cm    At [place] of 2nd Digit  6 cm    At [place] of Thumb  6.1 cm         measurement taken - decrease in  Hand, wrist and elbow - forearm and wrist still increase - but pt is R hand dominant      Pt to cont with   manual lymph drainage once each day to help decrease swelling. This should take you about 20 minutes depending on the size of your limb.And skin on skin  For R Arm:   Hug yourself at the base of your neck and do 8 small circles, and 2 fingers behind clavicle 8 x   Do 8 semicircles at L armpit and R groin  Pump across chest from R to the L 8 times  Pump down the R side of trunk from armpit to groin 8 times  Pump up the outside of R upper arm 8 times, inside of upper arm to outside 8x, outside  of upper arm again 8x   Pump across chest from R to L 8 times  Pump down the R side of trunk from armpit to groin 8 times  Pump top of forearm from wrist to elbow 8 times  Pump up the outside of R upper arm 8 times  Pump across chest from R to L 8 times  Pump down the R side of trunk from armpit to groin 8 times  Pump up the back of the forearm from wrist to elbow 8 times  Pump up the outside of R upper arm 8 times  Pump across chest from R to L 8 times  Pump down the R side of trunk from armpit to groin 8 times  Do 8 semicircles at L armpit and R`groin 8 times  Repeat nr.1  SLOW and LIGHT with only your palm NOT FINGERTIPS  Pt sleeve fits well - education done for pt  About wearing correctly - top band was double fold  - only for daytime   gauntlet little tight around MC's pt to wash it and stretch it while it dries - but she can assess lymphedema in hand during day - if no issues do not have to wear - if see swelling - can wear Isotoner glove or gauntlet  And night time tubigrip soft and isotoner glove                  OT Education - 07/08/19 1544    Education Details  progress and wearing of compression    Person(s) Educated  Patient    Methods  Explanation    Comprehension  Verbalized understanding          OT Long Term Goals - 06/04/19 1610      OT LONG TERM GOAL #1   Title  Patient will  demonstrate independence in wear, care and schedule of use of compression sleeve to manage lymphedema of right arm.    Baseline  no current compression garments at this time.    Time  8    Period  Weeks    Status  New    Target Date  07/26/19      OT LONG TERM GOAL #2   Title  Patient will demonstrate understanding of manual lymphatic drainage techniques to manage symptoms of lymphedema.    Baseline  no current program    Time  8  Period  Weeks    Status  New    Target Date  07/26/19      OT LONG TERM GOAL #3   Title  Patient will be  independent with home exercise program.    Baseline  no current program    Time  8    Period  Weeks    Status  New    Target Date  07/26/19            Plan - 07/08/19 1545    Clinical Impression Statement  Pt circumference still increase in forearm and wrist more than 1 cm to 2 cm compare to L UE - pt to wear daytime compression every day and gauntlet as needed - and then at night time still isotoner glove and soft tubigrip hand to elbow - will reassess in 3 wks    OT Occupational Profile and History  Detailed Assessment- Review of Records and additional review of physical, cognitive, psychosocial history related to current functional performance    Occupational performance deficits (Please refer to evaluation for details):  ADL's;IADL's    Body Structure / Function / Physical Skills  ADL;UE functional use;Decreased knowledge of precautions;Decreased knowledge of use of DME;IADL;Strength;Edema;ROM    Rehab Potential  Excellent    Clinical Decision Making  Limited treatment options, no task modification necessary    Comorbidities Affecting Occupational Performance:  May have comorbidities impacting occupational performance    Modification or Assistance to Complete Evaluation   No modification of tasks or assist necessary to complete eval    OT Frequency  --   3 wks   OT Duration  4 weeks    OT Treatment/Interventions  Self-care/ADL training;Therapeutic exercise;DME and/or AE instruction;Compression bandaging;Manual Therapy;Manual lymph drainage;Therapeutic activities;Patient/family education;Coping strategies training    Plan  assess circumference with wearing compresson --how she is doing with self  MLD- and    OT Home Exercise Plan  see pt instruction    Consulted and Agree with Plan of Care  Patient       Patient will benefit from skilled therapeutic intervention in order to improve the following deficits and impairments:   Body Structure / Function / Physical Skills: ADL, UE  functional use, Decreased knowledge of precautions, Decreased knowledge of use of DME, IADL, Strength, Edema, ROM       Visit Diagnosis: Postmastectomy lymphedema syndrome  Muscle weakness (generalized)    Problem List Patient Active Problem List   Diagnosis Date Noted  . Lymphedema 06/15/2019  . Encounter for screening colonoscopy   . Benign neoplasm of ascending colon   . Benign neoplasm of transverse colon   . Screen for colon cancer 10/14/2018  . Prediabetes 10/14/2018  . Chemotherapy-induced neuropathy ([place]) 09/10/2018  . Seroma of breast 07/31/2018  . Folliculitis 07/01/4817  . Peripheral neuropathy due to chemotherapy ([place]) 05/16/2018  . Chemotherapy induced neutropenia ([place]) 04/06/2018  . Leucopenia 03/29/2018  . Hypokalemia 03/11/2018  . Axillary mass, right 03/10/2018  . Constipation 02/01/2018  . Chickenpox 01/30/2018  . Skin cancer of anterior chest 01/30/2018  . Hemorrhoids 01/30/2018  . Osteopenia of neck of left femur 12/19/2017  . Encounter for antineoplastic chemotherapy 12/05/2017  . Goals of care, counseling/discussion 11/27/2017  . Primary cancer of upper outer quadrant of right breast ([place]) 11/14/2017  . Ductal carcinoma in situ (DCIS) of left breast 11/14/2017  . Cellulitis of left lower extremity 11/26/2016  . Hot flashes 08/02/2015  . Screening for thyroid disorder 08/02/2015  . Screening for diabetes  mellitus 08/02/2015  . Screening, anemia, deficiency, iron 08/02/2015  . History of basal cell cancer 08/02/2015  . Migraines 08/02/2015  . Routine physical examination 07/17/2015  . Dystonic tremor 05/23/2014    Rosalyn Gess  OTR/l,CLT 07/08/2019, 3:49 PM  [facility] [address] [place], [address] Phone: [phone]   Fax:  [phone]  Name: Megan Vega MRN: [id number] Date of Birth: 21-Jul-1956

## 2019-07-09 ENCOUNTER — Ambulatory Visit: Payer: 59 | Admitting: Surgery

## 2019-07-13 ENCOUNTER — Encounter: Payer: Self-pay | Admitting: Plastic Surgery

## 2019-07-15 ENCOUNTER — Ambulatory Visit: Payer: 59 | Admitting: General Surgery

## 2019-07-16 ENCOUNTER — Ambulatory Visit (INDEPENDENT_AMBULATORY_CARE_PROVIDER_SITE_OTHER): Payer: 59 | Admitting: Plastic Surgery

## 2019-07-16 ENCOUNTER — Other Ambulatory Visit: Payer: Self-pay

## 2019-07-16 ENCOUNTER — Encounter: Payer: Self-pay | Admitting: Plastic Surgery

## 2019-07-16 DIAGNOSIS — C50411 Malignant neoplasm of upper-outer quadrant of right female breast: Secondary | ICD-10-CM | POA: Diagnosis not present

## 2019-07-16 NOTE — Telephone Encounter (Signed)
Called and spoke with the patient regarding her MyChart message.  Informed the patient that per Dr. Perfecto Kingdom she apologize for the delay in getting back with her.  She said she can do a televisit with her.  Patient stated that she do not know how to do a televisit.  Informed the patient that I will speak with the ladies up front and they can help her.  Patient verbalized understanding and agreed.  Spoke back with the patient and informed her that I spoke with the ladies up front and they will do the televisit today, so they will call her today at 11:30am and they will explain what to do.  Patient verbalized understanding and agreed.//AB/CMA

## 2019-07-16 NOTE — Progress Notes (Addendum)
Subjective:    Patient ID: Megan Vega, female    DOB: [birth year], 63 y.o.   MRN: [id number]  The patient is a 63 yrs old wf joining me for a televisit.  The patient was seen previously for consultation for breast reconstruction.  She was treated by Dr. Bary Castilla at [facility] floor right breast invasive mammary carcinoma.  It was estrogen and progesterone positive and HER-2/neu positive.  She had positive axillary lymph node.  She also had left breast DCIS.  She underwent chemotherapy and right sided radiation.  She is 5 feet 5 inches tall and still weighs around 152 pounds.  Her preoperative bra was a 34C.  She does not have her nipple areola complex.  Her radiation ended January 2020.  She is joining me by a telemetry visit for discussion of breast reconstruction.  Her goal is to keep it simple.   Review of Systems  Constitutional: Negative for activity change.  HENT: Negative for dental problem.   Eyes: Negative.   Respiratory: Negative for chest tightness.   Gastrointestinal: Negative for abdominal pain.  Endocrine: Negative.   Genitourinary: Negative.   Musculoskeletal: Negative.   Neurological: Negative.   Hematological: Negative.   Psychiatric/Behavioral: Negative.        Objective:   Physical Exam Neurological:     Mental Status: She is alert.  Psychiatric:        Mood and Affect: Mood normal.        Behavior: Behavior normal.        Assessment & Plan:     ICD-10-CM   1. Primary cancer of upper outer quadrant of right breast ([place])  C50.411     Assessment and Plan:  A long, detailed conversation was had regarding the patient's options for breast reconstruction. Five main points, which are explained to all breast reconstruction patients, were discussed.  1. Breast reconstruction is an optional process.  2. Breast reconstruction is a multi-stage process which involves multiple surgeries spaced several months apart. The entire process can take over one year.  3. The  major goal of breast reconstruction is to have the patient look normal in clothing. When naked, there will always be scars.  4. Asymmetries are often present during the reconstruction process. Several operations may be needed, including surgery to the non-cancerous breast, to achieve satisfactory results.  5. No matter the reconstructive method, there are ways that the reconstruction can fail and a secondary reconstructive plan would need to be created.   A general discussion regarding all available methods of breast reconstruction were discussed. The types of reconstructions described included.  1. Tissue expander and implant based reconstruction, both single and multi-stage approaches.  2. Autologous only reconstructions, including free abdominal-tissue based reconstructions.  3. Combination procedures, particularly latissismus dorsi flaps combined with either expanders or implants.  For each of the reconstruction methods mentioned above, the risks, benefits, alternatives, scarring, and recovery time were discussed in great detail. Specific risks detailed included bleeding, infection, hematoma, seroma, scarring, pain, wound healing complications, flap loss, fat necrosis, capsular contracture, need for implant removal, donor site complications, bulge, hernia, umbilical necrosis, need for urgent reoperation, and need for dressing changes were discussed.   Assessment  Once all reconstruction options were presented, a focused discussion was had regarding the patient's suitability for each of these procedures.  The patient is interested in and bilateral expander placement with Flex HD for her reconstruction.  She understands that she will only be able to go as large as  the right side lesser due to the radiation.  We discussed autologous reconstruction as well as latissimus muscle flap.  She does not want to go down that road.  I think that is very reasonable.  The patient gave consent to have this visit  done by telemedicine / virtual visit.  This is also consent for access the chart and treat the patient via this visit. The patient is located at home.  I, the provider, am at the office.  We spent ~30 minutes together for the visit.

## 2019-07-20 ENCOUNTER — Other Ambulatory Visit: Payer: Self-pay | Admitting: Hematology and Oncology

## 2019-07-20 DIAGNOSIS — T451X5A Adverse effect of antineoplastic and immunosuppressive drugs, initial encounter: Secondary | ICD-10-CM

## 2019-07-20 DIAGNOSIS — G620 Drug-induced polyneuropathy: Secondary | ICD-10-CM

## 2019-07-26 ENCOUNTER — Encounter: Payer: Self-pay | Admitting: Hematology and Oncology

## 2019-07-27 ENCOUNTER — Encounter: Payer: Self-pay | Admitting: Surgery

## 2019-07-27 ENCOUNTER — Ambulatory Visit: Payer: 59 | Admitting: Surgery

## 2019-07-27 ENCOUNTER — Telehealth: Payer: Self-pay

## 2019-07-27 ENCOUNTER — Other Ambulatory Visit: Payer: Self-pay

## 2019-07-27 VITALS — BP 129/82 | HR 73 | Temp 97.5°F | Resp 16 | Ht 65.5 in | Wt 157.2 lb

## 2019-07-27 DIAGNOSIS — C50411 Malignant neoplasm of upper-outer quadrant of right female breast: Secondary | ICD-10-CM | POA: Diagnosis not present

## 2019-07-27 DIAGNOSIS — D0512 Intraductal carcinoma in situ of left breast: Secondary | ICD-10-CM | POA: Diagnosis not present

## 2019-07-27 NOTE — Patient Instructions (Addendum)
Our office will contact you in February 2021 to schedule a follow up appointment for March. Once you speak with Dr.Dillenham and she gives the ok to remove your port you can call our office to schedule this appointment.

## 2019-07-27 NOTE — Progress Notes (Signed)
07/27/2019  History of Present Illness: Megan Vega is a 63 y.o. female with a history of right breast cancer and left breast DCIS status post right modified radical mastectomy and left mastectomy with sentinel lymph node biopsy by Dr. Bary Castilla on 07/10/2018.  She is status post neoadjuvant chemotherapy status post adjuvant radiation as well.  She has been followed by Dr. Elisabeth Cara with plastic surgery and has plans to undergo breast reconstruction surgery bilaterally with tissue expanders on 08/30/2019.  She reports that she has been otherwise doing very well without any new palpable masses or lumps or skin changes.  Denies any worsening pain or other issues.  Past Medical History: Past Medical History:  Diagnosis Date  . Ankle fracture   . Cancer [facility])    Basal Cell Carcinoma, about 2011 chest  . Cataract   . Family history of adverse reaction to anesthesia    DAUGHTER-N/V  . Hemorrhoids   . History of methicillin resistant staphylococcus aureus (MRSA) 2015  . Migraines    MIGRAINES  . Personal history of chemotherapy   . Personal history of radiation therapy      Past Surgical History: Past Surgical History:  Procedure Laterality Date  . BREAST BIOPSY Bilateral 10/29/2017   L breast DCIS, R breast invasive mammary carcinoma of no special type, ER/PR+, Her2/neu 1+  . CESAREAN SECTION    . COLONOSCOPY  2012  . COLONOSCOPY WITH PROPOFOL N/A 12/01/2018   Procedure: COLONOSCOPY WITH PROPOFOL;  Surgeon: Lucilla Lame, MD;  Location[facility] ENDOSCOPY;  Service: Endoscopy;  Laterality: N/A;  . FRACTURE SURGERY    . IRRIGATION AND DEBRIDEMENT ABSCESS Right 03/12/2018   Procedure: IRRIGATION AND DEBRIDEMENT RIGHT AXILLARY ABSCESS;  Surgeon: Robert Bellow, MD;  Location: [facility] ORS;  Service: General;  Laterality: Right;  . MANDIBLE FRACTURE SURGERY    . MASTECTOMY    . MASTECTOMY MODIFIED RADICAL Right 07/10/2018   ypT2 ypN2a ER/PR positive, HER-2/neu negative  Surgeon: Robert Bellow, MD;  Location: [facility] ORS;  Service: General;  Laterality: Right;  . PORTACATH PLACEMENT Left 11/28/2017   Procedure: INSERTION PORT-A-CATH;  Surgeon: Robert Bellow, MD;  Location: [facility] ORS;  Service: General;  Laterality: Left;  . SIMPLE MASTECTOMY WITH AXILLARY SENTINEL NODE BIOPSY Left 07/10/2018   High-grade DCIS SIMPLE MASTECTOMY;  Surgeon: Robert Bellow, MD;  Location: [facility] ORS;  Service: General;  Laterality: Left;    Home Medications: Prior to Admission medications   Medication Sig Start Date End Date Taking? Authorizing Provider  acetaminophen (TYLENOL) 500 MG tablet Take 1,000 mg by mouth every 6 (six) hours as needed for moderate pain or headache.   Yes [provider]  aspirin-acetaminophen-caffeine (EXCEDRIN MIGRAINE) [id number] MG tablet Take 1 tablet by mouth every 6 (six) hours as needed for headache or migraine.   Yes [provider]  b complex vitamins tablet Take 1 tablet by mouth daily.   Yes [provider]  CALCIUM PO Take 1 tablet by mouth daily. 1200 MG Tablets   Yes [provider]  gabapentin (NEURONTIN) 100 MG capsule TAKE 1 CAPSULE (100 MG TOTAL) BY MOUTH AT BEDTIME. 07/27/19  Yes Corcoran, Melissa C, MD  letrozole (FEMARA) 2.5 MG tablet Take 1 tablet (2.5 mg total) by mouth daily. 05/04/19  Yes Lequita Asal, MD  Melatonin 3 MG CAPS Take 1 capsule by mouth daily.   Yes [provider]  Multiple Vitamin (MULTIVITAMIN WITH MINERALS) TABS tablet Take 1 tablet by mouth daily.   Yes  [provider]  mupirocin ointment (BACTROBAN) 2 % Apply 1 application topically 2 (two) times daily. X 5 days to nose and bid x 7-14 days right leg 09/10/18  Yes McLean-Scocuzza, Nino Glow, MD  polyethylene glycol (MIRALAX / GLYCOLAX) packet Take 17 g by mouth daily as needed (for constipation.).    Yes [provider]  Probiotic Product (PROBIOTIC PO) Take 1 capsule by mouth daily.   Yes [provider]   tetrahydrozoline 0.05 % ophthalmic solution Place 1 drop into both eyes 3 (three) times daily as needed (for dry/red eyes.).   Yes [provider]    Allergies: Allergies  Allergen Reactions  . Peanut-Containing Drug Products Swelling and Other (See Comments)    Only when eating too many peanuts, swelling with lips and tingly face    Review of Systems: Review of Systems  Constitutional: Negative for chills and fever.  Respiratory: Negative for shortness of breath.   Cardiovascular: Negative for chest pain.  Gastrointestinal: Negative for nausea and vomiting.  Skin: Negative for rash.    Physical Exam BP 129/82   Pulse 73   Temp (!) 97.5 F (36.4 C) (Temporal)   Resp 16   Ht 5' 5.5" (1.664 m)   Wt 157 lb 3.2 oz (71.3 kg)   SpO2 96%   BMI 25.76 kg/m  CONSTITUTIONAL: No acute distress HEENT:  Normocephalic, atraumatic, extraocular motion intact. RESPIRATORY:  Lungs are clear, and breath sounds are equal bilaterally. Normal respiratory effort without pathologic use of accessory muscles. CARDIOVASCULAR: Heart is regular without murmurs, gallops, or rubs. BREAST: Right breast status post modified radical mastectomy with well-healed scar.  No palpable masses, skin changes, or axillary or supraclavicular lymphadenopathy.  On the left patient status post mastectomy with sentinel lymph node biopsy.  No palpable masses, skin changes, left axillary or supraclavicular lymphadenopathy.  Port-A-Cath is in place in the left chest. NEUROLOGIC:  Motor and sensation is grossly normal.  Cranial nerves are grossly intact. PSYCH:  Alert and oriented to person, place and time. Affect is normal.  Labs/Imaging: None recently  Assessment and Plan: This is a 63 y.o. female with a history of right breast cancer and left breast DCIS status post modified radical mastectomy on the right and left mastectomy with sentinel lymph node biopsy on the left on 07/10/2018.  Discussed with the patient  that currently her exam is reassuring with no new findings.  No contraindications at this point for reconstructive surgery.  Patient has a follow-up appointment with Dr. Mike Gip next month.  I asked the patient to ask Dr. Mike Gip if the Port-A-Cath was still needed.  If not needed anymore, then I would be happy to remove it and discussed with the patient that this would be an office procedure with no general anesthesia needed.  Prior to any procedures, would check with Dr. Elisabeth Cara to make sure that removing her Port-A-Cath will not interfere with any of her surgeries.  Otherwise we can wait until her surgeries are done to remove it in the future.  Otherwise patient will follow-up in 6 months for another physical exam.  Face-to-face time spent with the patient and care providers was 15 minutes, with more than 50% of the time spent counseling, educating, and coordinating care of the patient.     Melvyn Neth, [facility]

## 2019-07-27 NOTE — Telephone Encounter (Signed)
Refill Gabapentin 100 mg 1 tab po daily # 30 with 1 refill. The has been made aware.

## 2019-07-29 ENCOUNTER — Other Ambulatory Visit: Payer: Self-pay

## 2019-07-29 ENCOUNTER — Ambulatory Visit: Payer: 59 | Attending: Hematology and Oncology | Admitting: Occupational Therapy

## 2019-07-29 DIAGNOSIS — I972 Postmastectomy lymphedema syndrome: Secondary | ICD-10-CM | POA: Diagnosis not present

## 2019-07-29 NOTE — Therapy (Signed)
[place] [facility] [address] [place], [address] Phone: [phone]   Fax:  [phone]  Occupational Therapy Treatment  Patient Details  Name: Megan Vega MRN: [id number] Date of Birth: [birth year] Referring Provider (OT): Katharina Caper   Encounter Date: 07/29/2019  OT End of Session - 07/29/19 1542    Visit Number  6    Number of Visits  8    Date for OT Re-Evaluation  08/26/19    OT Start Time  1502    OT Stop Time  1532    OT Time Calculation (min)  30 min    Activity Tolerance  Patient tolerated treatment well    Behavior During Therapy  [facility] for tasks assessed/performed       Past Medical History:  Diagnosis Date  . Ankle fracture   . Cancer [facility])    Basal Cell Carcinoma, about 2011 chest  . Cataract   . Family history of adverse reaction to anesthesia    DAUGHTER-N/V  . Hemorrhoids   . History of methicillin resistant staphylococcus aureus (MRSA) 2015  . Migraines    MIGRAINES  . Personal history of chemotherapy   . Personal history of radiation therapy     Past Surgical History:  Procedure Laterality Date  . BREAST BIOPSY Bilateral 10/29/2017   L breast DCIS, R breast invasive mammary carcinoma of no special type, ER/PR+, Her2/neu 1+  . CESAREAN SECTION    . COLONOSCOPY  2012  . COLONOSCOPY WITH PROPOFOL N/A 12/01/2018   Procedure: COLONOSCOPY WITH PROPOFOL;  Surgeon: Lucilla Lame, MD;  Location: [facility] ENDOSCOPY;  Service: Endoscopy;  Laterality: N/A;  . FRACTURE SURGERY    . IRRIGATION AND DEBRIDEMENT ABSCESS Right 03/12/2018   Procedure: IRRIGATION AND DEBRIDEMENT RIGHT AXILLARY ABSCESS;  Surgeon: Robert Bellow, MD;  Location: [facility] ORS;  Service: General;  Laterality: Right;  . MANDIBLE FRACTURE SURGERY    . MASTECTOMY    . MASTECTOMY MODIFIED RADICAL Right 07/10/2018   ypT2 ypN2a ER/PR positive, HER-2/neu negative  Surgeon: Robert Bellow, MD;  Location: [facility] ORS;  Service:  General;  Laterality: Right;  . PORTACATH PLACEMENT Left 11/28/2017   Procedure: INSERTION PORT-A-CATH;  Surgeon: Robert Bellow, MD;  Location: [facility] ORS;  Service: General;  Laterality: Left;  . SIMPLE MASTECTOMY WITH AXILLARY SENTINEL NODE BIOPSY Left 07/10/2018   High-grade DCIS SIMPLE MASTECTOMY;  Surgeon: Robert Bellow, MD;  Location: [facility] ORS;  Service: General;  Laterality: Left;    There were no vitals filed for this visit.  Subjective Assessment - 07/29/19 1540    Subjective   Doing okay- wearing my sleeve during day and isotoner glove daytime - night time still little white compression to elbow and glove - gauntlet to tight around my knuckles - I am scheduled for expanders 12th Oct    Pertinent History  Patient reports she was diagnosed in Nov 2018, chemo treatments first, bilateral mastectomy august 2019, last radiation treatment was Nov 19, 2018, unsure of how many treatments she had.  Has talked to plastic surgeon for recontruction surgery.  Unsure of how many lymph nodes she had removed, she remembers 9.  Chart indicates the following:BREAST, LEFT; SIMPLE MASTECTOMY: SENTINEL LYMPH NODES, LEFT; EXCISION: - FOUR LYMPH NODE NEGATIVE FOR MALIGNANCY (0/4).  C.  BREAST, RIGHT; SIMPLE MASTECTOMY: RESIDUAL INVASIVE MAMMARY CARCINOMA AND HIGH-GRADE DUCTAL CARCINOMA IN SITU, COMEDO TYPE. SENTINEL LYMPH NODES, RIGHT; EXCISION: - METASTATIC CARCINOMA INVOLVING TWO LYMPH NODES (2/2).  Patient Stated Goals  Patient reports she would like the right arm to look like the other arm.    Currently in Pain?  No/denies          LYMPHEDEMA/ONCOLOGY QUESTIONNAIRE - 07/29/19 1512      Right Upper Extremity Lymphedema   15 cm Proximal to Olecranon Process  31.3 cm    10 cm Proximal to Olecranon Process  29 cm    Olecranon Process  26.2 cm    15 cm Proximal to Ulnar Styloid Process  25 cm    10 cm Proximal to Ulnar Styloid Process  22.4 cm    Just Proximal to Ulnar Styloid Process  15.6 cm     Across Hand at [employer]  18.8 cm    At [place] of 2nd Digit  6 cm    At [facility] of Thumb  6 cm     measurement taken - decrease since last time - compare to other one - WNL - except one of the forearm measurements     Pt to cont with until her visit prior to reconstruction surgery   manual lymph drainage once each day to help decrease swelling. This should take you about 20 minutes depending on the size of your limb.And skin on skin  For R Arm:   Hug yourself at the base of your neck and do 8 small circles, and 2 fingers behind clavicle 8 x   Do 8 semicircles at L armpit and R groin  Pump across chest from R to the L 8 times  Pump down the R side of trunk from armpit to groin 8 times  Pump up the outside of R upper arm 8 times, inside of upper arm to outside 8x, outside of upper arm again 8x   Pump across chest from R to L 8 times  Pump down the R side of trunk from armpit to groin 8 times  Pump top of forearm from wrist to elbow 8 times  Pump up the outside of R upper arm 8 times  Pump across chest from R to L 8 times  Pump down the R side of trunk from armpit to groin 8 times  Pump up the back of the forearm from wrist to elbow 8 times  Pump up the outside of R upper arm 8 times  Pump across chest from R to L 8 times  Pump down the R side of trunk from armpit to groin 8 times  Do 8 semicircles at L armpit and R`groin 8 times  Repeat nr.1  SLOW and LIGHT with only your palm NOT FINGERTIPS  Sleept fits well - fitted with new isotoner glove - to wear as needed but otherwise stop wearing glove and compression at night     assess lymphedema in hand during day - if no issues do not have to wear - if see swelling - can wear Isotoner glove or gauntlet  And night time tubigrip soft and isotoner glove if see issues                      OT Education - 07/29/19 1542    Education Details  cont with daytime sleeve alone  for 3-4 wks - check up prior to surgery again    Person(s) Educated  Patient    Methods  Explanation    Comprehension  Verbalized understanding          OT Long Term Goals - 07/29/19  [place] #1   Title  Patient will  demonstrate independence in wear, care and schedule of use of compression sleeve to manage lymphedema of right arm.    Status  Achieved      OT LONG TERM GOAL #2   Title  Patient will demonstrate understanding of manual lymphatic drainage techniques to manage symptoms of lymphedema.    Status  Achieved      OT LONG TERM GOAL #3   Status  Achieved            Plan - 07/29/19 1544    Clinical Impression Statement  Pt circumference looks great in R UE - same or less than L except at 10cm proximal to ulnar styloid - pt to wear only daytime compression sleeve for 3-4 wks and will check on her week prior to her reconstruction surgery    OT Occupational Profile and History  Detailed Assessment- Review of Records and additional review of physical, cognitive, psychosocial history related to current functional performance    Occupational performance deficits (Please refer to evaluation for details):  ADL's;IADL's    Body Structure / Function / Physical Skills  ADL;UE functional use;Decreased knowledge of precautions;Decreased knowledge of use of DME;IADL;Strength;Edema;ROM    Rehab Potential  Excellent    Clinical Decision Making  Limited treatment options, no task modification necessary    Comorbidities Affecting Occupational Performance:  May have comorbidities impacting occupational performance    Modification or Assistance to Complete Evaluation   No modification of tasks or assist necessary to complete eval    OT Frequency  Monthly    OT Duration  4 weeks    OT Treatment/Interventions  Self-care/ADL training;Therapeutic exercise;DME and/or AE instruction;Compression bandaging;Manual Therapy;Manual lymph drainage;Therapeutic activities;Patient/family  education;Coping strategies training    Plan  circumference and wearing of sleeve- what to do for 1st 2 wks after surgery    OT Home Exercise Plan  see pt instruction    Consulted and Agree with Plan of Care  Patient       Patient will benefit from skilled therapeutic intervention in order to improve the following deficits and impairments:   Body Structure / Function / Physical Skills: ADL, UE functional use, Decreased knowledge of precautions, Decreased knowledge of use of DME, IADL, Strength, Edema, ROM       Visit Diagnosis: Postmastectomy lymphedema syndrome - Plan: Ot plan of care cert/re-cert    Problem List Patient Active Problem List   Diagnosis Date Noted  . Lymphedema 06/15/2019  . Encounter for screening colonoscopy   . Benign neoplasm of ascending colon   . Benign neoplasm of transverse colon   . Screen for colon cancer 10/14/2018  . Prediabetes 10/14/2018  . Chemotherapy-induced neuropathy ([place]) 09/10/2018  . Seroma of breast 07/31/2018  . Folliculitis 71/21/9758  . Peripheral neuropathy due to chemotherapy ([place]) 05/16/2018  . Chemotherapy induced neutropenia ([place]) 04/06/2018  . Leucopenia 03/29/2018  . Hypokalemia 03/11/2018  . Axillary mass, right 03/10/2018  . Constipation 02/01/2018  . Chickenpox 01/30/2018  . Skin cancer of anterior chest 01/30/2018  . Hemorrhoids 01/30/2018  . Osteopenia of neck of left femur 12/19/2017  . Encounter for antineoplastic chemotherapy 12/05/2017  . Goals of care, counseling/discussion 11/27/2017  . Primary cancer of upper outer quadrant of right breast ([place]) 11/14/2017  . Ductal carcinoma in situ (DCIS) of left breast 11/14/2017  . Cellulitis of left lower extremity 11/26/2016  . Hot flashes 08/02/2015  . Screening  for thyroid disorder 08/02/2015  . Screening for diabetes mellitus 08/02/2015  . Screening, anemia, deficiency, iron 08/02/2015  . History of basal cell cancer 08/02/2015  . Migraines 08/02/2015  . Routine  physical examination 07/17/2015  . Dystonic tremor 05/23/2014    Rosalyn Gess OTR/L,CLT 07/29/2019, 3:58 PM  [place] [facility] [address] [place], [address] Phone: [phone]   Fax:  [phone]  Name: Megan Vega MRN: [id number] Date of Birth: 28-Oct-1956

## 2019-07-29 NOTE — Patient Instructions (Signed)
Decrease to only daytime compression and see if can go with out isotoner glove  And night time stop wearing tubigrip and isotoner glove

## 2019-08-09 ENCOUNTER — Encounter: Payer: Self-pay | Admitting: Hematology and Oncology

## 2019-08-12 NOTE — Progress Notes (Signed)
Patient ID: Megan Vega, female    DOB: [birth year], 63 y.o.   MRN: [id number]  Chief Complaint  Patient presents with  . Pre-op Exam    for (B) breast reconstruction w/expander and Flex HD      ICD-10-CM   1. Primary cancer of upper outer quadrant of right breast ([place])  C50.411     History of Present Illness: Megan Vega is a 63 y.o.  female  with a history of right breast invasive mammary carcinoma that was ER/PR positive and HER-2/nu positive she also has a history of left breast DCIS and underwent chemotherapy and right-sided radiation which ended January 2020.  She presents for preoperative evaluation for upcoming procedure, bilateral expander placement and Flex HD, scheduled for 08/30/2019 with Dr. Marla Roe.  She is 5 feet 5 inches tall and still weighs around 152 pounds.   Preoperative bra was a 34C.   The patient has not had problems with anesthesia.  Mrs. Harbin is a former smoker. Quit over 40 years ago. No recent illnesses. No FMHx or personal history of DVT/PE. She is generally healthy other than neuropathy from chemotherapy.  She does not have any history of high blood pressure, diabetes, heart failure, MI, TIA.  Past Medical History: Allergies: Allergies  Allergen Reactions  . Peanut-Containing Drug Products Swelling and Other (See Comments)    Only when eating too many peanuts, swelling with lips and tingly face    Current Medications:  Current Outpatient Medications:  .  acetaminophen (TYLENOL) 500 MG tablet, Take 1,000 mg by mouth every 6 (six) hours as needed for moderate pain or headache., Disp: , Rfl:  .  aspirin-acetaminophen-caffeine (EXCEDRIN MIGRAINE) 250-250-65 MG tablet, Take 1 tablet by mouth every 6 (six) hours as needed for headache or migraine., Disp: , Rfl:  .  b complex vitamins tablet, Take 1 tablet by mouth daily., Disp: , Rfl:  .  CALCIUM PO, Take 1 tablet by mouth daily. 1200 MG Tablets, Disp: , Rfl:  .  gabapentin (NEURONTIN)  100 MG capsule, TAKE 1 CAPSULE (100 MG TOTAL) BY MOUTH AT BEDTIME., Disp: 30 capsule, Rfl: 1 .  letrozole (FEMARA) 2.5 MG tablet, Take 1 tablet (2.5 mg total) by mouth daily., Disp: 90 tablet, Rfl: 0 .  Melatonin 3 MG CAPS, Take 1 capsule by mouth daily., Disp: , Rfl:  .  Multiple Vitamin (MULTIVITAMIN WITH MINERALS) TABS tablet, Take 1 tablet by mouth daily., Disp: , Rfl:  .  mupirocin ointment (BACTROBAN) 2 %, Apply 1 application topically 2 (two) times daily. X 5 days to nose and bid x 7-14 days right leg, Disp: 30 g, Rfl: 1 .  polyethylene glycol (MIRALAX / GLYCOLAX) packet, Take 17 g by mouth daily as needed (for constipation.). , Disp: , Rfl:  .  Probiotic Product (PROBIOTIC PO), Take 1 capsule by mouth daily., Disp: , Rfl:  .  tetrahydrozoline 0.05 % ophthalmic solution, Place 1 drop into both eyes 3 (three) times daily as needed (for dry/red eyes.)., Disp: , Rfl:  No current facility-administered medications for this visit.   Facility-Administered Medications Ordered in Other Visits:  .  heparin lock flush 100 unit/mL, 500 Units, Intracatheter, PRN, Lequita Asal, MD  Past Medical Problems: Past Medical History:  Diagnosis Date  . Ankle fracture   . Cancer [facility])    Basal Cell Carcinoma, about 2011 chest  . Cataract   . Family history of adverse reaction to anesthesia    DAUGHTER-N/V  . Hemorrhoids   .  History of methicillin resistant staphylococcus aureus (MRSA) 2015  . Migraines    MIGRAINES  . Personal history of chemotherapy   . Personal history of radiation therapy     Past Surgical History: Past Surgical History:  Procedure Laterality Date  . BREAST BIOPSY Bilateral 10/29/2017   L breast DCIS, R breast invasive mammary carcinoma of no special type, ER/PR+, Her2/neu 1+  . CESAREAN SECTION    . COLONOSCOPY  2012  . COLONOSCOPY WITH PROPOFOL N/A 12/01/2018   Procedure: COLONOSCOPY WITH PROPOFOL;  Surgeon: Lucilla Lame, MD;  Location: [facility] ENDOSCOPY;  Service:  Endoscopy;  Laterality: N/A;  . FRACTURE SURGERY    . IRRIGATION AND DEBRIDEMENT ABSCESS Right 03/12/2018   Procedure: IRRIGATION AND DEBRIDEMENT RIGHT AXILLARY ABSCESS;  Surgeon: Robert Bellow, MD;  Location: [facility] ORS;  Service: General;  Laterality: Right;  . MANDIBLE FRACTURE SURGERY    . MASTECTOMY    . MASTECTOMY MODIFIED RADICAL Right 07/10/2018   ypT2 ypN2a ER/PR positive, HER-2/neu negative  Surgeon: Robert Bellow, MD;  Location: [facility] ORS;  Service: General;  Laterality: Right;  . PORTACATH PLACEMENT Left 11/28/2017   Procedure: INSERTION PORT-A-CATH;  Surgeon: Robert Bellow, MD;  Location: [facility] ORS;  Service: General;  Laterality: Left;  . SIMPLE MASTECTOMY WITH AXILLARY SENTINEL NODE BIOPSY Left 07/10/2018   High-grade DCIS SIMPLE MASTECTOMY;  Surgeon: Robert Bellow, MD;  Location: [facility] ORS;  Service: General;  Laterality: Left;    Social History: Social History   Socioeconomic History  . Marital status: Divorced    Spouse name: Not on file  . Number of children: Not on file  . Years of education: Not on file  . Highest education level: Not on file  Occupational History  . Not on file  Social Needs  . Financial resource strain: Not on file  . Food insecurity    Worry: Not on file    Inability: Not on file  . Transportation needs    Medical: Not on file    Non-medical: Not on file  Tobacco Use  . Smoking status: Former Smoker    Packs/day: 0.25    Years: 10.00    Pack years: 2.50    Quit date: 1978    Years since quitting: 42.7  . Smokeless tobacco: Never Used  Substance and Sexual Activity  . Alcohol use: Yes    Alcohol/week: 1.0 standard drinks    Types: 1 Glasses of wine per week    Comment: RARE  . Drug use: No  . Sexual activity: Not Currently  Lifestyle  . Physical activity    Days per week: Not on file    Minutes per session: Not on file  . Stress: Not on file  Relationships  . Social Herbalist on phone: Not on file     Gets together: Not on file    Attends religious service: Not on file    Active member of club or organization: Not on file    Attends meetings of clubs or organizations: Not on file    Relationship status: Not on file  . Intimate partner violence    Fear of current or ex partner: Not on file    Emotionally abused: Not on file    Physically abused: Not on file    Forced sexual activity: Not on file  Other Topics Concern  . 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  Lives at home (son 30 yo lives with her)   Children 2   Pets: 3 small dogs and 1 frog    Caffeine- 1 cup of coffee in the morning, rare tea    Family History: Family History  Problem Relation Age of Onset  . Cancer Mother        Esophageal Cancer  . Early death Mother        Age 57  . Cancer Father        Lung Cancer  . Diabetes Father   . Diabetes Brother        Controlled with diet  . Heart attack Paternal Grandfather   . Colon cancer Neg Hx     Review of Systems: Review of Systems  Constitutional: Negative.   HENT: Negative.   Respiratory: Negative.   Cardiovascular: Negative.   Gastrointestinal: Negative.   Genitourinary: Negative.   Musculoskeletal: Negative.   Skin: Negative.   Neurological: Negative.     Physical Exam: Vital Signs BP (!) 145/81 (BP Location: Left Arm, Patient Position: Sitting, Cuff Size: Normal)   Pulse 88   Temp (!) 96.8 F (36 C) (Temporal)   Ht 5' 6" (1.676 m)   Wt 155 lb 9.6 oz (70.6 kg)   SpO2 96%   BMI 25.11 kg/m  Physical Exam Exam conducted with a chaperone present.  Constitutional:      General: She is not in acute distress.    Appearance: Normal appearance. She is not ill-appearing.  HENT:     Head: Normocephalic and atraumatic.  Eyes:     Pupils: Pupils are equal, round Neck:     Musculoskeletal: Normal range of motion.  Cardiovascular:     Rate and Rhythm: Normal rate and regular rhythm.     Pulses: Normal  pulses.     Heart sounds: Normal heart sounds. No murmur.  Pulmonary:     Effort: Pulmonary effort is normal. No respiratory distress.     Breath sounds: Normal breath sounds. No wheezing.  Abdominal:     General: Abdomen is flat. There is no distension.     Palpations: Abdomen is soft.     Tenderness: There is no abdominal tenderness.  Musculoskeletal: Normal range of motion.  Skin:    General: Skin is warm and dry.     Findings: No erythema or rash.  Neurological:     General: No focal deficit present.     Mental Status: She is alert and oriented to person, place, and time. Mental status is at baseline.     Motor: No weakness.  Psychiatric:        Mood and Affect: Mood normal.        Behavior: Behavior normal.    Assessment/Plan: Mrs. Skipper is scheduled for bilateral expander placement with bilateral placement of Flex HD with Dr. Dillingham.  Risks, benefits, and alternatives of procedure discussed, questions answered and consent obtained.    The risks that can be encountered with and after placement of a breast expander placement were discussed and include the following but not limited to these: bleeding, infection, delayed healing, anesthesia risks, skin sensation changes, injury to structures including nerves, blood vessels, and muscles which may be temporary or permanent, allergies to tape, suture materials and glues, blood products, topical preparations or injected agents, skin contour irregularities, skin discoloration and swelling, deep vein thrombosis, cardiac and pulmonary complications, pain, which may persist, fluid accumulation, wrinkling of the skin over the expander, changes in nipple or breast   sensation, expander leakage or rupture, faulty position of the expander, persistent pain, formation of tight scar tissue around the expander (capsular contracture), possible need for revisional surgery or staged procedures.   Prescription sent to pharmacy.  Patient has COVID test  scheduled. She understands the size of her right expander will be limited due to the previous radiation.  She has a Caprini score of 6, recommend prophylaxis with SCDs.    Electronically signed by: Carola Rhine Sadiya Durand, PA-C 08/13/2019 1:49 PM

## 2019-08-12 NOTE — H&P (View-Only) (Signed)
Patient ID: Megan Vega, female    DOB: [birth year], 63 y.o.   MRN: [id number]  Chief Complaint  Patient presents with  . Pre-op Exam    for (B) breast reconstruction w/expander and Flex HD      ICD-10-CM   1. Primary cancer of upper outer quadrant of right breast ([place])  C50.411     History of Present Illness: Megan Vega is a 63 y.o.  female  with a history of right breast invasive mammary carcinoma that was ER/PR positive and HER-2/nu positive she also has a history of left breast DCIS and underwent chemotherapy and right-sided radiation which ended January 2020.  She presents for preoperative evaluation for upcoming procedure, bilateral expander placement and Flex HD, scheduled for 08/30/2019 with Dr. Marla Roe.  She is 5 feet 5 inches tall and still weighs around 152 pounds.   Preoperative bra was a 34C.   The patient has not had problems with anesthesia.  Mrs. Harbin is a former smoker. Quit over 40 years ago. No recent illnesses. No FMHx or personal history of DVT/PE. She is generally healthy other than neuropathy from chemotherapy.  She does not have any history of high blood pressure, diabetes, heart failure, MI, TIA.  Past Medical History: Allergies: Allergies  Allergen Reactions  . Peanut-Containing Drug Products Swelling and Other (See Comments)    Only when eating too many peanuts, swelling with lips and tingly face    Current Medications:  Current Outpatient Medications:  .  acetaminophen (TYLENOL) 500 MG tablet, Take 1,000 mg by mouth every 6 (six) hours as needed for moderate pain or headache., Disp: , Rfl:  .  aspirin-acetaminophen-caffeine (EXCEDRIN MIGRAINE) 250-250-65 MG tablet, Take 1 tablet by mouth every 6 (six) hours as needed for headache or migraine., Disp: , Rfl:  .  b complex vitamins tablet, Take 1 tablet by mouth daily., Disp: , Rfl:  .  CALCIUM PO, Take 1 tablet by mouth daily. 1200 MG Tablets, Disp: , Rfl:  .  gabapentin (NEURONTIN)  100 MG capsule, TAKE 1 CAPSULE (100 MG TOTAL) BY MOUTH AT BEDTIME., Disp: 30 capsule, Rfl: 1 .  letrozole (FEMARA) 2.5 MG tablet, Take 1 tablet (2.5 mg total) by mouth daily., Disp: 90 tablet, Rfl: 0 .  Melatonin 3 MG CAPS, Take 1 capsule by mouth daily., Disp: , Rfl:  .  Multiple Vitamin (MULTIVITAMIN WITH MINERALS) TABS tablet, Take 1 tablet by mouth daily., Disp: , Rfl:  .  mupirocin ointment (BACTROBAN) 2 %, Apply 1 application topically 2 (two) times daily. X 5 days to nose and bid x 7-14 days right leg, Disp: 30 g, Rfl: 1 .  polyethylene glycol (MIRALAX / GLYCOLAX) packet, Take 17 g by mouth daily as needed (for constipation.). , Disp: , Rfl:  .  Probiotic Product (PROBIOTIC PO), Take 1 capsule by mouth daily., Disp: , Rfl:  .  tetrahydrozoline 0.05 % ophthalmic solution, Place 1 drop into both eyes 3 (three) times daily as needed (for dry/red eyes.)., Disp: , Rfl:  No current facility-administered medications for this visit.   Facility-Administered Medications Ordered in Other Visits:  .  heparin lock flush 100 unit/mL, 500 Units, Intracatheter, PRN, Lequita Asal, MD  Past Medical Problems: Past Medical History:  Diagnosis Date  . Ankle fracture   . Cancer [facility])    Basal Cell Carcinoma, about 2011 chest  . Cataract   . Family history of adverse reaction to anesthesia    DAUGHTER-N/V  . Hemorrhoids   .  History of methicillin resistant staphylococcus aureus (MRSA) 2015  . Migraines    MIGRAINES  . Personal history of chemotherapy   . Personal history of radiation therapy     Past Surgical History: Past Surgical History:  Procedure Laterality Date  . BREAST BIOPSY Bilateral 10/29/2017   L breast DCIS, R breast invasive mammary carcinoma of no special type, ER/PR+, Her2/neu 1+  . CESAREAN SECTION    . COLONOSCOPY  2012  . COLONOSCOPY WITH PROPOFOL N/A 12/01/2018   Procedure: COLONOSCOPY WITH PROPOFOL;  Surgeon: Lucilla Lame, MD;  Location: [facility] ENDOSCOPY;  Service:  Endoscopy;  Laterality: N/A;  . FRACTURE SURGERY    . IRRIGATION AND DEBRIDEMENT ABSCESS Right 03/12/2018   Procedure: IRRIGATION AND DEBRIDEMENT RIGHT AXILLARY ABSCESS;  Surgeon: Robert Bellow, MD;  Location: [facility] ORS;  Service: General;  Laterality: Right;  . MANDIBLE FRACTURE SURGERY    . MASTECTOMY    . MASTECTOMY MODIFIED RADICAL Right 07/10/2018   ypT2 ypN2a ER/PR positive, HER-2/neu negative  Surgeon: Robert Bellow, MD;  Location: [facility] ORS;  Service: General;  Laterality: Right;  . PORTACATH PLACEMENT Left 11/28/2017   Procedure: INSERTION PORT-A-CATH;  Surgeon: Robert Bellow, MD;  Location: [facility] ORS;  Service: General;  Laterality: Left;  . SIMPLE MASTECTOMY WITH AXILLARY SENTINEL NODE BIOPSY Left 07/10/2018   High-grade DCIS SIMPLE MASTECTOMY;  Surgeon: Robert Bellow, MD;  Location: [facility] ORS;  Service: General;  Laterality: Left;    Social History: Social History   Socioeconomic History  . Marital status: Divorced    Spouse name: Not on file  . Number of children: Not on file  . Years of education: Not on file  . Highest education level: Not on file  Occupational History  . Not on file  Social Needs  . Financial resource strain: Not on file  . Food insecurity    Worry: Not on file    Inability: Not on file  . Transportation needs    Medical: Not on file    Non-medical: Not on file  Tobacco Use  . Smoking status: Former Smoker    Packs/day: 0.25    Years: 10.00    Pack years: 2.50    Quit date: 1978    Years since quitting: 42.7  . Smokeless tobacco: Never Used  Substance and Sexual Activity  . Alcohol use: Yes    Alcohol/week: 1.0 standard drinks    Types: 1 Glasses of wine per week    Comment: RARE  . Drug use: No  . Sexual activity: Not Currently  Lifestyle  . Physical activity    Days per week: Not on file    Minutes per session: Not on file  . Stress: Not on file  Relationships  . Social Herbalist on phone: Not on file     Gets together: Not on file    Attends religious service: Not on file    Active member of club or organization: Not on file    Attends meetings of clubs or organizations: Not on file    Relationship status: Not on file  . Intimate partner violence    Fear of current or ex partner: Not on file    Emotionally abused: Not on file    Physically abused: Not on file    Forced sexual activity: Not on file  Other Topics Concern  . 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  Lives at home (son 30 yo lives with her)   Children 2   Pets: 3 small dogs and 1 frog    Caffeine- 1 cup of coffee in the morning, rare tea    Family History: Family History  Problem Relation Age of Onset  . Cancer Mother        Esophageal Cancer  . Early death Mother        Age 54  . Cancer Father        Lung Cancer  . Diabetes Father   . Diabetes Brother        Controlled with diet  . Heart attack Paternal Grandfather   . Colon cancer Neg Hx     Review of Systems: Review of Systems  Constitutional: Negative.   HENT: Negative.   Respiratory: Negative.   Cardiovascular: Negative.   Gastrointestinal: Negative.   Genitourinary: Negative.   Musculoskeletal: Negative.   Skin: Negative.   Neurological: Negative.     Physical Exam: Vital Signs BP (!) 145/81 (BP Location: Left Arm, Patient Position: Sitting, Cuff Size: Normal)   Pulse 88   Temp (!) 96.8 F (36 C) (Temporal)   Ht _0  (1.676 m)   Wt 155 lb 9.6 oz (70.6 kg)   SpO2 96%   BMI 25.11 kg/m  Physical Exam Exam conducted with a chaperone present.  Constitutional:      General: She is not in acute distress.    Appearance: Normal appearance. She is not ill-appearing.  HENT:     Head: Normocephalic and atraumatic.  Eyes:     Pupils: Pupils are equal, round Neck:     Musculoskeletal: Normal range of motion.  Cardiovascular:     Rate and Rhythm: Normal rate and regular rhythm.     Pulses: Normal  pulses.     Heart sounds: Normal heart sounds. No murmur.  Pulmonary:     Effort: Pulmonary effort is normal. No respiratory distress.     Breath sounds: Normal breath sounds. No wheezing.  Abdominal:     General: Abdomen is flat. There is no distension.     Palpations: Abdomen is soft.     Tenderness: There is no abdominal tenderness.  Musculoskeletal: Normal range of motion.  Skin:    General: Skin is warm and dry.     Findings: No erythema or rash.  Neurological:     General: No focal deficit present.     Mental Status: She is alert and oriented to person, place, and time. Mental status is at baseline.     Motor: No weakness.  Psychiatric:        Mood and Affect: Mood normal.        Behavior: Behavior normal.    Assessment/Plan: Mrs. Parlett is scheduled for bilateral expander placement with bilateral placement of Flex HD with Dr. Marla Roe.  Risks, benefits, and alternatives of procedure discussed, questions answered and consent obtained.    The risks that can be encountered with and after placement of a breast expander placement were discussed and include the following but not limited to these: bleeding, infection, delayed healing, anesthesia risks, skin sensation changes, injury to structures including nerves, blood vessels, and muscles which may be temporary or permanent, allergies to tape, suture materials and glues, blood products, topical preparations or injected agents, skin contour irregularities, skin discoloration and swelling, deep vein thrombosis, cardiac and pulmonary complications, pain, which may persist, fluid accumulation, wrinkling of the skin over the expander, changes in nipple or breast  sensation, expander leakage or rupture, faulty position of the expander, persistent pain, formation of tight scar tissue around the expander (capsular contracture), possible need for revisional surgery or staged procedures.   Prescription sent to pharmacy.  Patient has COVID test  scheduled. She understands the size of her right expander will be limited due to the previous radiation.  She has a Caprini score of 6, recommend prophylaxis with SCDs.    Electronically signed by: Carola Rhine Jozsef Wescoat, PA-C 08/13/2019 1:49 PM

## 2019-08-13 ENCOUNTER — Encounter: Payer: Self-pay | Admitting: Surgical

## 2019-08-13 ENCOUNTER — Ambulatory Visit (INDEPENDENT_AMBULATORY_CARE_PROVIDER_SITE_OTHER): Payer: 59 | Admitting: Surgical

## 2019-08-13 ENCOUNTER — Other Ambulatory Visit: Payer: Self-pay

## 2019-08-13 VITALS — BP 145/81 | HR 88 | Temp 96.8°F | Ht 66.0 in | Wt 155.6 lb

## 2019-08-13 DIAGNOSIS — C50411 Malignant neoplasm of upper-outer quadrant of right female breast: Secondary | ICD-10-CM

## 2019-08-13 MED ORDER — CEPHALEXIN 500 MG PO CAPS: 500.0000 mg | ORAL_CAPSULE | Freq: Two times a day (BID) | ORAL | 0 refills | Status: AC

## 2019-08-13 MED ORDER — DIAZEPAM 2 MG PO TABS: 2.0000 mg | ORAL_TABLET | Freq: Two times a day (BID) | ORAL | 0 refills | Status: DC | PRN

## 2019-08-13 MED ORDER — HYDROCODONE-ACETAMINOPHEN 5-325 MG PO TABS: 1.0000 | ORAL_TABLET | Freq: Four times a day (QID) | ORAL | 0 refills | Status: AC | PRN

## 2019-08-13 MED ORDER — ONDANSETRON HCL 4 MG PO TABS: 4.0000 mg | ORAL_TABLET | Freq: Three times a day (TID) | ORAL | 0 refills | Status: DC | PRN

## 2019-08-22 NOTE — Progress Notes (Signed)
[facility]  [address], [address] [place], [place] [address] Phone: [phone]  Fax: [phone]   Clinic Day:  08/23/2019  Referring physician: Burnard Hawthorne, FNP  Chief Complaint: Megan Vega is a 63 y.o. female with IIIB right breast cancer and DCIS of the left breast who is seen for 3 month assessment  HPI: The patient was last seen in the medical oncology clinic on 05/24/2019. At that time, she had increased neuropathy and right upper extremity lymphedema.  She denied any breast concerns.  The patient was seen for initial evaluation of postmastectomy lymphedema syndrome and generalized muscle weakness by Rosalyn Gess, OT on 05/31/2019 at [place].  She reported neuropathy in bilateral hands and feet since her diagnosis of cancer diagnosis and treatment with chemotherapy.  She tended to drop items frequently.   She was independent with all ADLs currently.  She reported heaviness in right arm and difficulty at times reaching to wash her back.  The patient was seen for breast reconstrution by Audelia Hives, DO via telemedicine on 07/16/2019. A long, detailed conversation was held regarding the patients options for breast reconstruction.   During the interim, she has done well. She met Dr. Dahlia Byes. She is doing well with physical therapy. She received a sleeve to keep swelling in her arms down; she does exercises.  She declined the [name] trial.  She still has neuropathy in toes and fingers.  Pain is not as bad.  She is still taking gabapentin. She has seen the dentist (Dr. Beatrix Shipper with [facility] in [place]).  She had an extraction.  She went back last week and everything "looked good".  She would like to try Zometa.  She is still taking her letrozole and calcium.   Past Medical History:  Diagnosis Date   Ankle fracture    Cancer ([place])    Basal Cell Carcinoma, about 2011 chest   Cataract    Family  history of adverse reaction to anesthesia    DAUGHTER-N/V   Hemorrhoids    History of methicillin resistant staphylococcus aureus (MRSA) 2015   Migraines    MIGRAINES   Personal history of chemotherapy    Personal history of radiation therapy     Past Surgical History:  Procedure Laterality Date   BREAST BIOPSY Bilateral 10/29/2017   L breast DCIS, R breast invasive mammary carcinoma of no special type, ER/PR+, Her2/neu 1+   CESAREAN SECTION     COLONOSCOPY  2012   COLONOSCOPY WITH PROPOFOL N/A 12/01/2018   Procedure: COLONOSCOPY WITH PROPOFOL;  Surgeon: Lucilla Lame, MD;  Location: [facility];  Service: Endoscopy;  Laterality: N/A;   FRACTURE SURGERY     IRRIGATION AND DEBRIDEMENT ABSCESS Right 03/12/2018   Procedure: IRRIGATION AND DEBRIDEMENT RIGHT AXILLARY ABSCESS;  Surgeon: Robert Bellow, MD;  Location: [facility];  Service: General;  Laterality: Right;   MANDIBLE FRACTURE SURGERY     MASTECTOMY     MASTECTOMY MODIFIED RADICAL Right 07/10/2018   ypT2 ypN2a ER/PR positive, HER-2/neu negative  Surgeon: Robert Bellow, MD;  Location: [facility];  Service: General;  Laterality: Right;   PORTACATH PLACEMENT Left 11/28/2017   Procedure: INSERTION PORT-A-CATH;  Surgeon: Robert Bellow, MD;  Location: [facility];  Service: General;  Laterality: Left;   SIMPLE MASTECTOMY WITH AXILLARY SENTINEL NODE BIOPSY Left 07/10/2018   High-grade DCIS SIMPLE MASTECTOMY;  Surgeon: Robert Bellow, MD;  Location: [facility];  Service: General;  Laterality:  Left;    Family History  Problem Relation Age of Onset   Cancer Mother        Esophageal Cancer   Early death Mother        Age 10   Cancer Father        Lung Cancer   Diabetes Father    Diabetes Brother        Controlled with diet   Heart attack Paternal Grandfather    Colon cancer Neg Hx     Social History:  reports that she quit smoking about 42 years ago. She has a 2.50 pack-year smoking history. She has  never used smokeless tobacco. She reports current alcohol use of about 1.0 standard drinks of alcohol per week. She reports that she does not use drugs.  She lives in [place].  The patient is alone today.  Allergies:  Allergies  Allergen Reactions   Peanut-Containing Drug Products Swelling and Other (See Comments)    Only when eating too many peanuts, swelling with lips and tingly face    Current Medications: Current Outpatient Medications  Medication Sig Dispense Refill   b complex vitamins tablet Take 1 tablet by mouth daily.     CALCIUM PO Take 1,200 mg by mouth daily. 1200 MG Tablets     gabapentin (NEURONTIN) 100 MG capsule TAKE 1 CAPSULE (100 MG TOTAL) BY MOUTH AT BEDTIME. 30 capsule 1   letrozole (FEMARA) 2.5 MG tablet Take 1 tablet (2.5 mg total) by mouth daily. (Patient taking differently: Take 2.5 mg by mouth every evening. ) 90 tablet 0   loratadine (CLARITIN) 10 MG tablet Take 10 mg by mouth daily.     Melatonin 3 MG CAPS Take 3 mg by mouth at bedtime as needed (sleep.).      Multiple Vitamin (MULTIVITAMIN WITH MINERALS) TABS tablet Take 1 tablet by mouth daily. Centrum Silver     Probiotic Product (PROBIOTIC PO) Take 1 capsule by mouth daily.     tetrahydrozoline 0.05 % ophthalmic solution Place 1 drop into both eyes 3 (three) times daily as needed (for dry/red eyes.).     acetaminophen (TYLENOL) 500 MG tablet Take 1,000 mg by mouth every 6 (six) hours as needed for moderate pain or headache.     B Complex-C (B-COMPLEX WITH VITAMIN C) tablet Take 1 tablet by mouth daily.     diazepam (VALIUM) 2 MG tablet Take 1 tablet (2 mg total) by mouth every 12 (twelve) hours as needed for anxiety. (Patient not taking: Reported on 08/23/2019) 20 tablet 0   mupirocin ointment (BACTROBAN) 2 % Apply 1 application topically 2 (two) times daily. X 5 days to nose and bid x 7-14 days right leg (Patient not taking: Reported on 08/23/2019) 30 g 1   ondansetron (ZOFRAN) 4 MG tablet  Take 1 tablet (4 mg total) by mouth every 8 (eight) hours as needed for nausea or vomiting. (Patient not taking: Reported on 08/23/2019) 20 tablet 0   polyethylene glycol (MIRALAX / GLYCOLAX) packet Take 17 g by mouth daily as needed (for constipation.).      No current facility-administered medications for this visit.    Facility-Administered Medications Ordered in Other Visits  Medication Dose Route Frequency Provider Last Rate Last Dose   heparin lock flush 100 unit/mL  500 Units Intracatheter PRN Ekaterini Capitano C, MD       heparin lock flush 100 unit/mL  500 Units Intravenous Once Mike Gip, Drue Second, MD       sodium chloride  flush (NS) 0.9 % injection 10 mL  10 mL Intravenous PRN Lequita Asal, MD   10 mL at 08/23/19 2563    Review of Systems  Constitutional: Negative for chills, diaphoresis, fever, malaise/fatigue and weight loss (up 13 lbs since 05/2019).       Feels "good".  HENT: Negative.  Negative for congestion, ear pain, nosebleeds, sinus pain and sore throat.   Eyes: Negative.  Negative for blurred vision and double vision.  Respiratory: Negative.  Negative for cough, hemoptysis, sputum production and shortness of breath.   Cardiovascular: Negative.  Negative for chest pain, palpitations, orthopnea and leg swelling.  Gastrointestinal: Negative.  Negative for abdominal pain, blood in stool, constipation, diarrhea, heartburn, melena, nausea and vomiting.  Genitourinary: Negative.  Negative for dysuria, frequency, hematuria and urgency.  Musculoskeletal: Negative for back pain, falls, joint pain, myalgias and neck pain.       Lymphedema.  Skin: Negative.  Negative for rash.  Neurological: Positive for sensory change (not as bad still in tips of finger and feet). Negative for dizziness, speech change, focal weakness, weakness and headaches.  Endo/Heme/Allergies: Negative.  Does not bruise/bleed easily.  Psychiatric/Behavioral: Negative.  Negative for depression. The  patient is not nervous/anxious.    Performance status (ECOG): 0 - Asymptomatic  Vitals Blood pressure 119/71, pulse 76, temperature (!) 96 F (35.6 C), temperature source Tympanic, resp. rate 18, height 5' 6" (1.676 m), weight 169 lb 10.3 oz (76.9 kg), SpO2 97 %.   Physical Exam  Constitutional: She is oriented to person, place, and time. She appears well-developed and well-nourished.  HENT:  Head: Normocephalic.  Mouth/Throat: Oropharynx is clear and moist. No oropharyngeal exudate.  Gray hair pulled up.  Mask.  Eyes: Pupils are equal, round, and reactive to light. Conjunctivae and EOM are normal. No scleral icterus.  Neck: Normal range of motion. Neck supple. No JVD present.  Cardiovascular: Normal rate, regular rhythm and normal heart sounds.  Pulmonary/Chest: Effort normal and breath sounds normal. No respiratory distress. She has no wheezes. She has no rales. She exhibits no tenderness.  Bilateral mastectomy.  No erythema or nodularity.    Abdominal: Soft. Bowel sounds are normal. She exhibits no mass. There is no abdominal tenderness. There is no rebound and no guarding.  Musculoskeletal: Normal range of motion.     Comments: Minimal right upper extremity swelling.  Lymphadenopathy:    She has no cervical adenopathy.  Neurological: She is alert and oriented to person, place, and time.  Skin: Skin is warm and dry.  Psychiatric: She has a normal mood and affect. Her behavior is normal. Judgment and thought content normal.  Nursing note and vitals reviewed.   Infusion on 08/23/2019  Component Date Value Ref Range Status   Sodium 08/23/2019 138  135 - 145 mmol/L Final   Potassium 08/23/2019 4.0  3.5 - 5.1 mmol/L Final   Chloride 08/23/2019 104  98 - 111 mmol/L Final   CO2 08/23/2019 25  22 - 32 mmol/L Final   Glucose, Bld 08/23/2019 81  70 - 99 mg/dL Final   BUN 08/23/2019 13  8 - 23 mg/dL Final   Creatinine, Ser 08/23/2019 0.96  0.44 - 1.00 mg/dL Final   Calcium  08/23/2019 9.4  8.9 - 10.3 mg/dL Final   Total Protein 08/23/2019 7.5  6.5 - 8.1 g/dL Final   Albumin 08/23/2019 4.3  3.5 - 5.0 g/dL Final   AST 08/23/2019 26  15 - 41 U/L Final   ALT 08/23/2019  19  0 - 44 U/L Final   Alkaline Phosphatase 08/23/2019 76  38 - 126 U/L Final   Total Bilirubin 08/23/2019 0.7  0.3 - 1.2 mg/dL Final   GFR calc non Af Amer 08/23/2019 >60  >60 mL/min Final   GFR calc Af Amer 08/23/2019 >60  >60 mL/min Final   Anion gap 08/23/2019 9  5 - 15 Final   Performed at [facility], [address], [place], [place] [address]   WBC 08/23/2019 3.6* 4.0 - 10.5 K/uL Final   RBC 08/23/2019 5.03  3.87 - 5.11 MIL/uL Final   Hemoglobin 08/23/2019 15.1* 12.0 - 15.0 g/dL Final   HCT 08/23/2019 43.4  36.0 - 46.0 % Final   MCV 08/23/2019 86.3  80.0 - 100.0 fL Final   MCH 08/23/2019 30.0  26.0 - 34.0 pg Final   MCHC 08/23/2019 34.8  30.0 - 36.0 g/dL Final   RDW 08/23/2019 12.6  11.5 - 15.5 % Final   Platelets 08/23/2019 207  150 - 400 K/uL Final   nRBC 08/23/2019 0.0  0.0 - 0.2 % Final   Neutrophils Relative % 08/23/2019 55  % Final   Neutro Abs 08/23/2019 2.0  1.7 - 7.7 K/uL Final   Lymphocytes Relative 08/23/2019 30  % Final   Lymphs Abs 08/23/2019 1.1  0.7 - 4.0 K/uL Final   Monocytes Relative 08/23/2019 11  % Final   Monocytes Absolute 08/23/2019 0.4  0.1 - 1.0 K/uL Final   Eosinophils Relative 08/23/2019 3  % Final   Eosinophils Absolute 08/23/2019 0.1  0.0 - 0.5 K/uL Final   Basophils Relative 08/23/2019 1  % Final   Basophils Absolute 08/23/2019 0.1  0.0 - 0.1 K/uL Final   Immature Granulocytes 08/23/2019 0  % Final   Abs Immature Granulocytes 08/23/2019 0.01  0.00 - 0.07 K/uL Final   Performed at [facility], [address] [place], [place] [address]    Assessment:  Megan Vega is a 63 y.o. female with clinical stage T2N3bM0 right breast cancerand DCIS in the left breasts/p neoadjuvant  chemotherapyfollowed by left simple mastectomy and right modified radical mastectomy on 07/10/2018. Left breast pathologyrevealed 2.8 cm residual high grade DCIS, comedo type. There was atypical lobular hyperplasia. Four sentinel lymph nodes were negative. Right breast pathologyrevealed 3.6 cm residual grade II invasive mammary carcinoma and high grade DCIS. There was lymphovascular invasion.There was metastatic carcinoma in 2 sentinel lymph nodes. Right axillary dissection revealed metastatic carcinoma in 7 lymph nodes. There were a total of 9 lymph nodes with macrometastasis (largest 22 mm). Pathologic stagewas ypT2 ypN2a.  She underwent ultrasound guided biopsyon 10/29/2017. Pathology from the right breastat the 10 o'clock position 3 cm from the nipple revealed a 1.4 cm grade II invasive mammary carcinoma of no special type. Tumor was ER + (>90%), PR + (90%) and Her2/neu 1+. Right axillary node biopsyrevealed metastatic carcinoma. Left breast biopsyat the 2 o'clock position 3 cm from the nipple revealed high grade DCIS with comedonecrosis.   She received 4 cycles of neoadjuvantAC (12/05/2017 - 01/30/2018) with OnPro Neulasta support. She received 12 weeks ofneoadjuvant Taxol(02/13/2018 - 02/27/2018; 04/10/2018 - 06/05/2018). She received radiationfrom 09/23/2018 - 11/23/2018.  She began letrozole on 01/06/2019.  CA27.29has been followed: 23.5 on 11/14/2017,< 3.5 on 08/27/2018, 7.4 on 01/06/2019, 9.2 on 05/24/2019, and 11.9 on 08/23/2019.  Bone densityon 12/16/2016 revealed osteopeniawith a T-score of -1.6 in the LEFTfemoral neck on 12/16/2017. Bone densityon 11/24/2018 revealed osteopenia with a T-score of -2.1  in the LEFTfemoral neck.  She developed a right axillary abscessand underwent incision and drainage on 03/12/2018. Culture grew staph aureus. She completed Bactrim on 03/26/2018.  Symptomatically, she is doing well.  She is planning reconstructive  surgery.  Exam is unremarkable.  Plan: 1.   Labs today:  CBC with diff, CMP, CA27.29. 2.   Stage IIIBRIGHTbreast cancer             Clinically, she is doing well.    She is s/p bilateral mastectomy.    Discuss upcoming plans for reconstruction.             Continue letrozole. 3.   Chemotherapy induced neuropathy She has a mild neuropathy. B12 was normal on 05/24/2019. Increase gabapentin to 200 mg in the evening. 4.   Osteopenia Bone density on 11/2018 revealed a T-score of -2.1. Discuss continuation of calcium and vitamin D. Re-review plan for Zometa twice yearly. Await clearance from dentist (Dr. Jeannie Fend Matsko with [facility]). 5.   Lymphedema             Patient has been seen in the lymphedema clinic. 6.   Contact Dr.Matsko dentist [phone]) re: Zometa. 7.    Possible Zometa today. 8.    Patient considering port-a-cath removal. 9.    RTC in 3 months for MD assessment, port flush, and labs (CBC with diff, CMP, CA 27.29).  Addendum:  Patient cleared for Zometa today.  I discussed the assessment and treatment plan with the patient.  The patient was provided an opportunity to ask questions and all were answered.  The patient agreed with the plan and demonstrated an understanding of the instructions.  The patient was advised to call back if the symptoms worsen or if the condition fails to improve as anticipated.  I provided 15 minutes (10:11 AM - 10:25 AM) of face-to-face time during this this encounter and > 50% was spent counseling as documented under my assessment and plan.    Lequita Asal, MD, PhD    08/23/2019, 10:25 AM  I, Samul Dada, am acting as a scribe for Lequita Asal, MD.  I, Gooding Mike Gip, MD, have reviewed the above documentation for accuracy and completeness, and I agree with the above.

## 2019-08-23 ENCOUNTER — Encounter: Payer: Self-pay | Admitting: Hematology and Oncology

## 2019-08-23 ENCOUNTER — Other Ambulatory Visit: Payer: Self-pay

## 2019-08-23 ENCOUNTER — Inpatient Hospital Stay: Payer: 59 | Attending: Hematology and Oncology | Admitting: Hematology and Oncology

## 2019-08-23 ENCOUNTER — Inpatient Hospital Stay: Payer: 59

## 2019-08-23 ENCOUNTER — Telehealth: Payer: Self-pay

## 2019-08-23 VITALS — BP 119/71 | HR 76 | Temp 96.0°F | Resp 18 | Ht 66.0 in | Wt 169.6 lb

## 2019-08-23 DIAGNOSIS — C50411 Malignant neoplasm of upper-outer quadrant of right female breast: Secondary | ICD-10-CM

## 2019-08-23 DIAGNOSIS — I890 Lymphedema, not elsewhere classified: Secondary | ICD-10-CM

## 2019-08-23 DIAGNOSIS — D701 Agranulocytosis secondary to cancer chemotherapy: Secondary | ICD-10-CM

## 2019-08-23 DIAGNOSIS — C773 Secondary and unspecified malignant neoplasm of axilla and upper limb lymph nodes: Secondary | ICD-10-CM | POA: Insufficient documentation

## 2019-08-23 DIAGNOSIS — Z85828 Personal history of other malignant neoplasm of skin: Secondary | ICD-10-CM | POA: Insufficient documentation

## 2019-08-23 DIAGNOSIS — Z923 Personal history of irradiation: Secondary | ICD-10-CM | POA: Diagnosis not present

## 2019-08-23 DIAGNOSIS — G620 Drug-induced polyneuropathy: Secondary | ICD-10-CM | POA: Insufficient documentation

## 2019-08-23 DIAGNOSIS — Z801 Family history of malignant neoplasm of trachea, bronchus and lung: Secondary | ICD-10-CM | POA: Insufficient documentation

## 2019-08-23 DIAGNOSIS — T451X5A Adverse effect of antineoplastic and immunosuppressive drugs, initial encounter: Secondary | ICD-10-CM | POA: Diagnosis not present

## 2019-08-23 DIAGNOSIS — Z79899 Other long term (current) drug therapy: Secondary | ICD-10-CM | POA: Diagnosis not present

## 2019-08-23 DIAGNOSIS — C50911 Malignant neoplasm of unspecified site of right female breast: Secondary | ICD-10-CM | POA: Insufficient documentation

## 2019-08-23 DIAGNOSIS — Z170 Estrogen receptor positive status [ER+]: Secondary | ICD-10-CM | POA: Diagnosis not present

## 2019-08-23 DIAGNOSIS — Z79811 Long term (current) use of aromatase inhibitors: Secondary | ICD-10-CM | POA: Insufficient documentation

## 2019-08-23 DIAGNOSIS — Z9013 Acquired absence of bilateral breasts and nipples: Secondary | ICD-10-CM | POA: Insufficient documentation

## 2019-08-23 DIAGNOSIS — Z87891 Personal history of nicotine dependence: Secondary | ICD-10-CM | POA: Insufficient documentation

## 2019-08-23 DIAGNOSIS — M8580 Other specified disorders of bone density and structure, unspecified site: Secondary | ICD-10-CM | POA: Diagnosis not present

## 2019-08-23 DIAGNOSIS — Z9221 Personal history of antineoplastic chemotherapy: Secondary | ICD-10-CM | POA: Diagnosis not present

## 2019-08-23 DIAGNOSIS — M85852 Other specified disorders of bone density and structure, left thigh: Secondary | ICD-10-CM | POA: Diagnosis not present

## 2019-08-23 DIAGNOSIS — D0512 Intraductal carcinoma in situ of left breast: Secondary | ICD-10-CM

## 2019-08-23 LAB — CBC WITH DIFFERENTIAL/PLATELET
Abs Immature Granulocytes: 0.01 10*3/uL (ref 0.00–0.07)
Basophils Absolute: 0.1 10*3/uL (ref 0.0–0.1)
Basophils Relative: 1 %
Eosinophils Absolute: 0.1 10*3/uL (ref 0.0–0.5)
Eosinophils Relative: 3 %
HCT: 43.4 % (ref 36.0–46.0)
Hemoglobin: 15.1 g/dL — ABNORMAL HIGH (ref 12.0–15.0)
Immature Granulocytes: 0 %
Lymphocytes Relative: 30 %
Lymphs Abs: 1.1 10*3/uL (ref 0.7–4.0)
MCH: 30 pg (ref 26.0–34.0)
MCHC: 34.8 g/dL (ref 30.0–36.0)
MCV: 86.3 fL (ref 80.0–100.0)
Monocytes Absolute: 0.4 10*3/uL (ref 0.1–1.0)
Monocytes Relative: 11 %
Neutro Abs: 2 10*3/uL (ref 1.7–7.7)
Neutrophils Relative %: 55 %
Platelets: 207 10*3/uL (ref 150–400)
RBC: 5.03 MIL/uL (ref 3.87–5.11)
RDW: 12.6 % (ref 11.5–15.5)
WBC: 3.6 10*3/uL — ABNORMAL LOW (ref 4.0–10.5)
nRBC: 0 % (ref 0.0–0.2)

## 2019-08-23 LAB — COMPREHENSIVE METABOLIC PANEL
ALT: 19 U/L (ref 0–44)
AST: 26 U/L (ref 15–41)
Albumin: 4.3 g/dL (ref 3.5–5.0)
Alkaline Phosphatase: 76 U/L (ref 38–126)
Anion gap: 9 (ref 5–15)
BUN: 13 mg/dL (ref 8–23)
CO2: 25 mmol/L (ref 22–32)
Calcium: 9.4 mg/dL (ref 8.9–10.3)
Chloride: 104 mmol/L (ref 98–111)
Creatinine, Ser: 0.96 mg/dL (ref 0.44–1.00)
GFR calc Af Amer: >60 mL/min (ref >60)
GFR calc non Af Amer: >60 mL/min (ref >60)
Glucose, Bld: 81 mg/dL (ref 70–99)
Potassium: 4 mmol/L (ref 3.5–5.1)
Sodium: 138 mmol/L (ref 135–145)
Total Bilirubin: 0.7 mg/dL (ref 0.3–1.2)
Total Protein: 7.5 g/dL (ref 6.5–8.1)

## 2019-08-23 MED ORDER — HEPARIN SOD (PORK) LOCK FLUSH 100 UNIT/ML IV SOLN
500.0000 [IU] | Freq: Once | INTRAVENOUS | Status: AC
  Administered 2019-08-23: 500 [IU] via INTRAVENOUS

## 2019-08-23 MED ORDER — SODIUM CHLORIDE 0.9 % IV SOLN
Freq: Once | INTRAVENOUS | Status: AC
  Administered 2019-08-23: 11:00:00 via INTRAVENOUS
  Filled 2019-08-23: qty 250

## 2019-08-23 MED ORDER — SODIUM CHLORIDE 0.9% FLUSH
10.0000 mL | INTRAVENOUS | Status: DC | PRN
  Administered 2019-08-23: 10:00:00 10 mL via INTRAVENOUS
  Filled 2019-08-23: qty 10

## 2019-08-23 MED ORDER — ZOLEDRONIC ACID 4 MG/100ML IV SOLN
4.0000 mg | Freq: Once | INTRAVENOUS | Status: AC
  Administered 2019-08-23: 11:00:00 4 mg via INTRAVENOUS

## 2019-08-23 NOTE — Patient Instructions (Signed)
Zoledronic Acid injection (Hypercalcemia, Oncology) What is this medicine? ZOLEDRONIC ACID (ZOE le dron ik AS id) lowers the amount of calcium loss from bone. It is used to treat too much calcium in your blood from cancer. It is also used to prevent complications of cancer that has spread to the bone. This medicine may be used for other purposes; ask your health care provider or pharmacist if you have questions. COMMON BRAND NAME(S): Zometa What should I tell my health care provider before I take this medicine? They need to know if you have any of these conditions:  aspirin-sensitive asthma  cancer, especially if you are receiving medicines used to treat cancer  dental disease or wear dentures  infection  kidney disease  receiving corticosteroids like dexamethasone or prednisone  an unusual or allergic reaction to zoledronic acid, other medicines, foods, dyes, or preservatives  pregnant or trying to get pregnant  breast-feeding How should I use this medicine? This medicine is for infusion into a vein. It is given by a health care professional in a hospital or clinic setting. Talk to your pediatrician regarding the use of this medicine in children. Special care may be needed. Overdosage: If you think you have taken too much of this medicine contact a poison control center or emergency room at once. NOTE: This medicine is only for you. Do not share this medicine with others. What if I miss a dose? It is important not to miss your dose. Call your doctor or health care professional if you are unable to keep an appointment. What may interact with this medicine?  certain antibiotics given by injection  NSAIDs, medicines for pain and inflammation, like ibuprofen or naproxen  some diuretics like bumetanide, furosemide  teriparatide  thalidomide This list may not describe all possible interactions. Give your health care provider a list of all the medicines, herbs, non-prescription  drugs, or dietary supplements you use. Also tell them if you smoke, drink alcohol, or use illegal drugs. Some items may interact with your medicine. What should I watch for while using this medicine? Visit your doctor or health care professional for regular checkups. It may be some time before you see the benefit from this medicine. Do not stop taking your medicine unless your doctor tells you to. Your doctor may order blood tests or other tests to see how you are doing. Women should inform their doctor if they wish to become pregnant or think they might be pregnant. There is a potential for serious side effects to an unborn child. Talk to your health care professional or pharmacist for more information. You should make sure that you get enough calcium and vitamin D while you are taking this medicine. Discuss the foods you eat and the vitamins you take with your health care professional. Some people who take this medicine have severe bone, joint, and/or muscle pain. This medicine may also increase your risk for jaw problems or a broken thigh bone. Tell your doctor right away if you have severe pain in your jaw, bones, joints, or muscles. Tell your doctor if you have any pain that does not go away or that gets worse. Tell your dentist and dental surgeon that you are taking this medicine. You should not have major dental surgery while on this medicine. See your dentist to have a dental exam and fix any dental problems before starting this medicine. Take good care of your teeth while on this medicine. Make sure you see your dentist for regular follow-up   appointments. What side effects may I notice from receiving this medicine? Side effects that you should report to your doctor or health care professional as soon as possible:  allergic reactions like skin rash, itching or hives, swelling of the face, lips, or tongue  anxiety, confusion, or depression  breathing problems  changes in vision  eye  pain  feeling faint or lightheaded, falls  jaw pain, especially after dental work  mouth sores  muscle cramps, stiffness, or weakness  redness, blistering, peeling or loosening of the skin, including inside the mouth  trouble passing urine or change in the amount of urine Side effects that usually do not require medical attention (report to your doctor or health care professional if they continue or are bothersome):  bone, joint, or muscle pain  constipation  diarrhea  fever  hair loss  irritation at site where injected  loss of appetite  nausea, vomiting  stomach upset  trouble sleeping  trouble swallowing  weak or tired This list may not describe all possible side effects. Call your doctor for medical advice about side effects. You may report side effects to FDA at 1-800-FDA-1088. Where should I keep my medicine? This drug is given in a hospital or clinic and will not be stored at home. NOTE: This sheet is a summary. It may not cover all possible information. If you have questions about this medicine, talk to your doctor, pharmacist, or health care provider.  2020 Elsevier/Gold Standard (2014-04-02 14:19:39)  

## 2019-08-23 NOTE — Telephone Encounter (Signed)
Spoke with receptionist with Dr. Jason Coop office to request dental clearance for Zometa. Dr. Dallas Schimke gave verbal permission for patient to receive Zometa. Requested fax to be sent over stating this as well. Receptionist verbalizes understanding and denies any further questions or concerns.

## 2019-08-23 NOTE — Progress Notes (Signed)
No new changes noted today 

## 2019-08-24 ENCOUNTER — Ambulatory Visit: Payer: 59 | Attending: Hematology and Oncology | Admitting: Occupational Therapy

## 2019-08-24 ENCOUNTER — Encounter
Admission: RE | Admit: 2019-08-24 | Discharge: 2019-08-24 | Disposition: A | Discharge status: Home / Self Care | Payer: 59 | Source: Ambulatory Visit | Attending: Plastic Surgery | Admitting: Plastic Surgery

## 2019-08-24 DIAGNOSIS — M6281 Muscle weakness (generalized): Secondary | ICD-10-CM | POA: Insufficient documentation

## 2019-08-24 DIAGNOSIS — I972 Postmastectomy lymphedema syndrome: Secondary | ICD-10-CM

## 2019-08-24 LAB — CANCER ANTIGEN 27.29: CA 27.29: 11.9 U/mL (ref 0.0–38.6)

## 2019-08-24 NOTE — Therapy (Signed)
[facility] [address], [place], [address] Phone: [phone]   Fax:  [phone]  Occupational Therapy Treatment  Patient Details  Name: Megan Vega MRN: [id number] Date of Birth: 1955/11/28 Referring Provider (OT): Katharina Caper   Encounter Date: 08/24/2019  OT End of Session - 08/24/19 1635    Visit Number  7    Number of Visits  8    Date for OT Re-Evaluation  08/26/19    OT Start Time  1605    OT Stop Time  1624    OT Time Calculation (min)  19 min    Activity Tolerance  Patient tolerated treatment well    Behavior During Therapy  [facility] for tasks assessed/performed       Past Medical History:  Diagnosis Date  . Ankle fracture   . Cancer [facility])    Basal Cell Carcinoma, about 2011 chest  . Cataract   . Family history of adverse reaction to anesthesia    DAUGHTER-N/V  . Hemorrhoids   . History of methicillin resistant staphylococcus aureus (MRSA) 2015  . Migraines    MIGRAINES occ  . Personal history of chemotherapy   . Personal history of radiation therapy     Past Surgical History:  Procedure Laterality Date  . BREAST BIOPSY Bilateral 10/29/2017   L breast DCIS, R breast invasive mammary carcinoma of no special type, ER/PR+, Her2/neu 1+  . CESAREAN SECTION    . COLONOSCOPY  2012  . COLONOSCOPY WITH PROPOFOL N/A 12/01/2018   Procedure: COLONOSCOPY WITH PROPOFOL;  Surgeon: Lucilla Lame, MD;  Location: [facility] ENDOSCOPY;  Service: Endoscopy;  Laterality: N/A;  . FRACTURE SURGERY     ankle  . IRRIGATION AND DEBRIDEMENT ABSCESS Right 03/12/2018   Procedure: IRRIGATION AND DEBRIDEMENT RIGHT AXILLARY ABSCESS;  Surgeon: Robert Bellow, MD;  Location: [facility] ORS;  Service: General;  Laterality: Right;  . MANDIBLE FRACTURE SURGERY    . MASTECTOMY    . MASTECTOMY MODIFIED RADICAL Right 07/10/2018   ypT2 ypN2a ER/PR positive, HER-2/neu negative  Surgeon: Robert Bellow, MD;  Location: [facility] ORS;   Service: General;  Laterality: Right;  . PORTACATH PLACEMENT Left 11/28/2017   Procedure: INSERTION PORT-A-CATH;  Surgeon: Robert Bellow, MD;  Location: [facility] ORS;  Service: General;  Laterality: Left;  . SIMPLE MASTECTOMY WITH AXILLARY SENTINEL NODE BIOPSY Left 07/10/2018   High-grade DCIS SIMPLE MASTECTOMY;  Surgeon: Robert Bellow, MD;  Location: [facility] ORS;  Service: General;  Laterality: Left;    There were no vitals filed for this visit.  Subjective Assessment - 08/24/19 1632    Subjective   Only wearing my daysleeve and nothing at night time - I have COVID test Thursday and then 12th my surgery    Patient Stated Goals  Patient reports she would like the right arm to look like the other arm.    Currently in Pain?  No/denies          LYMPHEDEMA/ONCOLOGY QUESTIONNAIRE - 08/24/19 1614      Right Upper Extremity Lymphedema   15 cm Proximal to Olecranon Process  31.5 cm    10 cm Proximal to Olecranon Process  29.5 cm    Olecranon Process  26 cm    15 cm Proximal to Ulnar Styloid Process  24.5 cm    10 cm Proximal to Ulnar Styloid Process  22.4 cm    Just Proximal to Ulnar Styloid Process  15.5 cm    Across  Hand at [employer]  19 cm    At [place] of 2nd Digit  6 cm    At [facility] of Thumb  6 cm      Pt's measurements great -able to maintain her UE circumference with only over the counter compression sleeve-  Pt to cont with MLD until surgery and ed on donning after surgery without straining pect  Pt family to assist with donning when she can  And wear during day  If need she can wear isotoner glove at night time and tubigrip on forearm                  OT Education - 08/24/19 1634    Education Details  cont daysleeve and contact after surgery if need to come back to therapy for ROM    Person(s) Educated  Patient    Methods  Explanation    Comprehension  Verbalized understanding          OT Long Term Goals - 07/29/19 1548      OT LONG TERM GOAL #1    Title  Patient will  demonstrate independence in wear, care and schedule of use of compression sleeve to manage lymphedema of right arm.    Status  Achieved      OT LONG TERM GOAL #2   Title  Patient will demonstrate understanding of manual lymphatic drainage techniques to manage symptoms of lymphedema.    Status  Achieved      OT LONG TERM GOAL #3   Status  Achieved            Plan - 08/24/19 1714    Clinical Impression Statement  Pt's circumference looks great - being maintain with only over the counter day time compression sleeve - pt is having surgery on the 12th Oct -expanders - pt to try and wear compression on her arm after surgery and contact me if need to return for  UE ROM    OT Occupational Profile and History  Detailed Assessment- Review of Records and additional review of physical, cognitive, psychosocial history related to current functional performance    Occupational performance deficits (Please refer to evaluation for details):  ADL's;IADL's    Body Structure / Function / Physical Skills  ADL;UE functional use;Decreased knowledge of precautions;Decreased knowledge of use of DME;IADL;Strength;Edema;ROM    Rehab Potential  Excellent    Clinical Decision Making  Limited treatment options, no task modification necessary    Comorbidities Affecting Occupational Performance:  May have comorbidities impacting occupational performance    Modification or Assistance to Complete Evaluation   No modification of tasks or assist necessary to complete eval    OT Frequency  Monthly    OT Duration  4 weeks    OT Treatment/Interventions  Self-care/ADL training;Therapeutic exercise;DME and/or AE instruction;Compression bandaging;Manual Therapy;Manual lymph drainage;Therapeutic activities;Patient/family education;Coping strategies training    Plan  contact me when she needs to return    OT Home Exercise Plan  see pt instruction    Consulted and Agree with Plan of Care  Patient        Patient will benefit from skilled therapeutic intervention in order to improve the following deficits and impairments:   Body Structure / Function / Physical Skills: ADL, UE functional use, Decreased knowledge of precautions, Decreased knowledge of use of DME, IADL, Strength, Edema, ROM       Visit Diagnosis: Postmastectomy lymphedema syndrome  Muscle weakness (generalized)    Problem List Patient Active Problem  List   Diagnosis Date Noted  . Lymphedema 06/15/2019  . Encounter for screening colonoscopy   . Benign neoplasm of ascending colon   . Benign neoplasm of transverse colon   . Screen for colon cancer 10/14/2018  . Prediabetes 10/14/2018  . Chemotherapy-induced neuropathy ([place]) 09/10/2018  . Seroma of breast 07/31/2018  . Folliculitis 50/27/7412  . Peripheral neuropathy due to chemotherapy ([place]) 05/16/2018  . Chemotherapy induced neutropenia ([place]) 04/06/2018  . Leucopenia 03/29/2018  . Hypokalemia 03/11/2018  . Axillary mass, right 03/10/2018  . Constipation 02/01/2018  . Chickenpox 01/30/2018  . Skin cancer of anterior chest 01/30/2018  . Hemorrhoids 01/30/2018  . Osteopenia of neck of left femur 12/19/2017  . Encounter for antineoplastic chemotherapy 12/05/2017  . Goals of care, counseling/discussion 11/27/2017  . Primary cancer of upper outer quadrant of right breast ([place]) 11/14/2017  . Ductal carcinoma in situ (DCIS) of left breast 11/14/2017  . Cellulitis of left lower extremity 11/26/2016  . Hot flashes 08/02/2015  . Screening for thyroid disorder 08/02/2015  . Screening for diabetes mellitus 08/02/2015  . Screening, anemia, deficiency, iron 08/02/2015  . History of basal cell cancer 08/02/2015  . Migraines 08/02/2015  . Routine physical examination 07/17/2015  . Dystonic tremor 05/23/2014    Rosalyn Gess OTR/L,CLT 08/24/2019, 5:18 PM  [facility] [address],  [place], [address] Phone: [phone]   Fax:  [phone]  Name: Megan Vega MRN: [id number] Date of Birth: 01-08-1956

## 2019-08-24 NOTE — Patient Instructions (Signed)
Your procedure is scheduled on: 08-30-19 MONDAY Report to Same Day Surgery 2nd floor medical mall [facility] Entrance-take elevator on left to 2nd floor.  Check in with surgery information desk.) To find out your arrival time please call [phone] between 1PM - 3PM on 08-27-19 FRIDAY  Remember: Instructions that are not followed completely may result in serious medical risk, up to and including death, or upon the discretion of your surgeon and anesthesiologist your surgery may need to be rescheduled.    _x___ 1. Do not eat food after midnight the night before your procedure. NO GUM OR CANDY AFTER MIDNIGHT. You may drink clear liquids up to 2 hours before you are scheduled to arrive at the hospital for your procedure.  Do not drink clear liquids within 2 hours of your scheduled arrival to the hospital.  Clear liquids include  --Water or Apple juice without pulp  --Gatorade  --Black Coffee or Clear Tea (No milk, no creamers, do not add anything to the coffee or Tea)   ____Ensure clear carbohydrate drink on the way to the hospital for bariatric patients  ____Ensure clear carbohydrate drink 3 hours before surgery.    __x__ 2. No Alcohol for 24 hours before or after surgery.   __x__3. No Smoking or e-cigarettes for 24 prior to surgery.  Do not use any chewable tobacco products for at least 6 hour prior to surgery   ____  4. Bring all medications with you on the day of surgery if instructed.    __x__ 5. Notify your doctor if there is any change in your medical condition     (cold, fever, infections).    x___6. On the morning of surgery brush your teeth with toothpaste and water.  You may rinse your mouth with mouth wash if you wish.  Do not swallow any toothpaste or mouthwash.   Do not wear jewelry, make-up, hairpins, clips or nail polish.  Do not wear lotions, powders, or perfumes.  Do not shave 48 hours prior to surgery. Men may shave face and neck.  Do not bring valuables to the  hospital.    [facility] is not responsible for any belongings or valuables.               Contacts, dentures or bridgework may not be worn into surgery.  Leave your suitcase in the car. After surgery it may be brought to your room.  For patients admitted to the hospital, discharge time is determined by your treatment team.  _  Patients discharged the day of surgery will not be allowed to drive home.  You will need someone to drive you home and stay with you the night of your procedure.    Please read over the following fact sheets that you were given:   [facility] Preparing for Surgery and or MRSA Information   _x___ TAKE THE FOLLOWING MEDICATION THE MORNING OF SURGERY WITH A SMALL SIP OF WATER. These include:  1. CLARITIN (LORATADINE)  2.  3.  4.  5.  6.  ____Fleets enema or Magnesium Citrate as directed.   _x___ Use CHG Soap or sage wipes as directed on instruction sheet   ____ Use inhalers on the day of surgery and bring to hospital day of surgery  ____ Stop Metformin and Janumet 2 days prior to surgery.    ____ Take 1/2 of usual insulin dose the night before surgery and none on the morning surgery.   ____ Follow recommendations from Cardiologist, Pulmonologist  or PCP regarding stopping Aspirin, Coumadin, Plavix ,Eliquis, Effient, or Pradaxa, and Pletal.  X____Stop Anti-inflammatories such as Advil, Aleve, Ibuprofen, Motrin, Naproxen, Naprosyn, Goodies powders or aspirin products NOW- OK to take Tylenol    _x___ Stop supplements until after surgery-STOP MELATONIN NOW-MAY RESUME AFTER SURGERY   ____ Bring C-Pap to the hospital.    [place] ON 08-26-19 FOR DRIVE UP COVID SWAB BETWEEN 8 AM-10:30 AM

## 2019-08-24 NOTE — Patient Instructions (Signed)
Wearing sleeve during day -and after surgery - but have family help her donn and doff And cont MLD until surgery

## 2019-08-26 ENCOUNTER — Other Ambulatory Visit
Admission: RE | Admit: 2019-08-26 | Discharge: 2019-08-26 | Disposition: A | Discharge status: Home / Self Care | Payer: 59 | Source: Ambulatory Visit | Attending: Plastic Surgery | Admitting: Plastic Surgery

## 2019-08-26 ENCOUNTER — Other Ambulatory Visit: Payer: Self-pay

## 2019-08-26 DIAGNOSIS — Z01812 Encounter for preprocedural laboratory examination: Secondary | ICD-10-CM | POA: Diagnosis present

## 2019-08-26 DIAGNOSIS — Z20828 Contact with and (suspected) exposure to other viral communicable diseases: Secondary | ICD-10-CM | POA: Diagnosis not present

## 2019-08-26 LAB — SARS CORONAVIRUS 2 (TAT 6-24 HRS): SARS Coronavirus 2: NEGATIVE

## 2019-08-30 ENCOUNTER — Encounter
Admission: AD | Disposition: A | Discharge status: Home / Self Care | Payer: Self-pay | Source: Ambulatory Visit | Attending: Plastic Surgery

## 2019-08-30 ENCOUNTER — Encounter: Payer: Self-pay | Admitting: *Deleted

## 2019-08-30 ENCOUNTER — Ambulatory Visit: Payer: 59 | Admitting: Certified Registered"

## 2019-08-30 ENCOUNTER — Other Ambulatory Visit: Payer: Self-pay

## 2019-08-30 ENCOUNTER — Ambulatory Visit
Admission: AD | Admit: 2019-08-30 | Discharge: 2019-08-30 | Disposition: A | Discharge status: Home / Self Care | Payer: 59 | Source: Ambulatory Visit | Attending: Plastic Surgery | Admitting: Plastic Surgery

## 2019-08-30 DIAGNOSIS — Z85828 Personal history of other malignant neoplasm of skin: Secondary | ICD-10-CM | POA: Insufficient documentation

## 2019-08-30 DIAGNOSIS — Z8614 Personal history of Methicillin resistant Staphylococcus aureus infection: Secondary | ICD-10-CM | POA: Insufficient documentation

## 2019-08-30 DIAGNOSIS — C50411 Malignant neoplasm of upper-outer quadrant of right female breast: Secondary | ICD-10-CM

## 2019-08-30 DIAGNOSIS — Z170 Estrogen receptor positive status [ER+]: Secondary | ICD-10-CM | POA: Insufficient documentation

## 2019-08-30 DIAGNOSIS — Z9013 Acquired absence of bilateral breasts and nipples: Secondary | ICD-10-CM | POA: Insufficient documentation

## 2019-08-30 DIAGNOSIS — Z79811 Long term (current) use of aromatase inhibitors: Secondary | ICD-10-CM | POA: Diagnosis not present

## 2019-08-30 DIAGNOSIS — Z9221 Personal history of antineoplastic chemotherapy: Secondary | ICD-10-CM | POA: Insufficient documentation

## 2019-08-30 DIAGNOSIS — Z87891 Personal history of nicotine dependence: Secondary | ICD-10-CM | POA: Insufficient documentation

## 2019-08-30 DIAGNOSIS — Z923 Personal history of irradiation: Secondary | ICD-10-CM | POA: Insufficient documentation

## 2019-08-30 DIAGNOSIS — C50911 Malignant neoplasm of unspecified site of right female breast: Secondary | ICD-10-CM | POA: Diagnosis not present

## 2019-08-30 DIAGNOSIS — Z421 Encounter for breast reconstruction following mastectomy: Secondary | ICD-10-CM | POA: Diagnosis not present

## 2019-08-30 DIAGNOSIS — I739 Peripheral vascular disease, unspecified: Secondary | ICD-10-CM | POA: Diagnosis not present

## 2019-08-30 HISTORY — PX: BREAST RECONSTRUCTION WITH PLACEMENT OF TISSUE EXPANDER AND FLEX HD (ACELLULAR HYDRATED DERMIS): SHX6295

## 2019-08-30 SURGERY — BREAST RECONSTRUCTION WITH PLACEMENT OF TISSUE EXPANDER AND FLEX HD (ACELLULAR HYDRATED DERMIS)
Anesthesia: General | Site: Breast | Laterality: Bilateral

## 2019-08-30 MED ORDER — OXYCODONE HCL 5 MG PO TABS
5.0000 mg | ORAL_TABLET | ORAL | Status: DC | PRN
  Administered 2019-08-30: 5 mg via ORAL

## 2019-08-30 MED ORDER — ONDANSETRON HCL 4 MG/2ML IJ SOLN
INTRAMUSCULAR | Status: DC | PRN
  Administered 2019-08-30 (×2): 4 mg via INTRAVENOUS

## 2019-08-30 MED ORDER — OXYCODONE HCL 5 MG PO TABS
ORAL_TABLET | ORAL | Status: AC
  Filled 2019-08-30: qty 1

## 2019-08-30 MED ORDER — CEFAZOLIN SODIUM-DEXTROSE 2-4 GM/100ML-% IV SOLN
INTRAVENOUS | Status: AC
  Filled 2019-08-30: qty 100

## 2019-08-30 MED ORDER — PROMETHAZINE HCL 25 MG/ML IJ SOLN: 6.2500 mg | INTRAMUSCULAR | Status: DC | PRN

## 2019-08-30 MED ORDER — FENTANYL CITRATE (PF) 100 MCG/2ML IJ SOLN: 25.0000 ug | INTRAMUSCULAR | Status: DC | PRN

## 2019-08-30 MED ORDER — FAMOTIDINE 20 MG PO TABS
ORAL_TABLET | ORAL | Status: AC
  Filled 2019-08-30: qty 1

## 2019-08-30 MED ORDER — SODIUM CHLORIDE 0.9 % IV SOLN: 250.0000 mL | INTRAVENOUS | Status: DC | PRN

## 2019-08-30 MED ORDER — SODIUM CHLORIDE FLUSH 0.9 % IV SOLN
INTRAVENOUS | Status: AC
  Filled 2019-08-30: qty 10

## 2019-08-30 MED ORDER — CEFAZOLIN SODIUM-DEXTROSE 2-4 GM/100ML-% IV SOLN
2.0000 g | INTRAVENOUS | Status: AC
  Administered 2019-08-30: 2 g via INTRAVENOUS

## 2019-08-30 MED ORDER — ROCURONIUM BROMIDE 100 MG/10ML IV SOLN: INTRAVENOUS | Status: DC | PRN

## 2019-08-30 MED ORDER — PROPOFOL 10 MG/ML IV BOLUS
INTRAVENOUS | Status: DC | PRN
  Administered 2019-08-30: 150 mg via INTRAVENOUS

## 2019-08-30 MED ORDER — LIDOCAINE HCL (CARDIAC) PF 100 MG/5ML IV SOSY
PREFILLED_SYRINGE | INTRAVENOUS | Status: DC | PRN
  Administered 2019-08-30: 100 mg via INTRAVENOUS

## 2019-08-30 MED ORDER — EPHEDRINE SULFATE 50 MG/ML IJ SOLN
INTRAMUSCULAR | Status: DC | PRN
  Administered 2019-08-30: 10 mg via INTRAVENOUS

## 2019-08-30 MED ORDER — CHLORHEXIDINE GLUCONATE CLOTH 2 % EX PADS
6.0000 | MEDICATED_PAD | Freq: Once | CUTANEOUS | Status: AC
  Administered 2019-08-30: 07:00:00 6 via TOPICAL

## 2019-08-30 MED ORDER — SODIUM CHLORIDE 0.9 % IV SOLN
INTRAVENOUS | Status: DC | PRN
  Administered 2019-08-30: 500 mL

## 2019-08-30 MED ORDER — ACETAMINOPHEN 10 MG/ML IV SOLN
INTRAVENOUS | Status: AC
  Filled 2019-08-30: qty 100

## 2019-08-30 MED ORDER — NEOMYCIN-POLYMYXIN B GU 40-200000 IR SOLN
Status: AC
  Filled 2019-08-30: qty 2

## 2019-08-30 MED ORDER — BUPIVACAINE-EPINEPHRINE 0.25% -1:200000 IJ SOLN
INTRAMUSCULAR | Status: DC | PRN
  Administered 2019-08-30: 10 mL

## 2019-08-30 MED ORDER — SODIUM CHLORIDE 0.9% FLUSH: 3.0000 mL | INTRAVENOUS | Status: DC | PRN

## 2019-08-30 MED ORDER — BACITRACIN 50000 UNITS IM SOLR
INTRAMUSCULAR | Status: AC
  Filled 2019-08-30: qty 1

## 2019-08-30 MED ORDER — MIDAZOLAM HCL 2 MG/2ML IJ SOLN
INTRAMUSCULAR | Status: AC
  Filled 2019-08-30: qty 2

## 2019-08-30 MED ORDER — MIDAZOLAM HCL 2 MG/2ML IJ SOLN
INTRAMUSCULAR | Status: DC | PRN
  Administered 2019-08-30: 2 mg via INTRAVENOUS

## 2019-08-30 MED ORDER — PHENYLEPHRINE HCL (PRESSORS) 10 MG/ML IV SOLN
INTRAVENOUS | Status: DC | PRN
  Administered 2019-08-30 (×2): 100 ug via INTRAVENOUS

## 2019-08-30 MED ORDER — DEXAMETHASONE SODIUM PHOSPHATE 10 MG/ML IJ SOLN
INTRAMUSCULAR | Status: DC | PRN
  Administered 2019-08-30: 10 mg via INTRAVENOUS

## 2019-08-30 MED ORDER — EPINEPHRINE PF 1 MG/ML IJ SOLN
INTRAMUSCULAR | Status: AC
  Filled 2019-08-30: qty 1

## 2019-08-30 MED ORDER — LACTATED RINGERS IV SOLN
INTRAVENOUS | Status: DC | PRN
  Administered 2019-08-30: 07:00:00 via INTRAVENOUS

## 2019-08-30 MED ORDER — FENTANYL CITRATE (PF) 100 MCG/2ML IJ SOLN
INTRAMUSCULAR | Status: AC
  Filled 2019-08-30: qty 2

## 2019-08-30 MED ORDER — PROPOFOL 10 MG/ML IV BOLUS
INTRAVENOUS | Status: AC
  Filled 2019-08-30: qty 40

## 2019-08-30 MED ORDER — FENTANYL CITRATE (PF) 100 MCG/2ML IJ SOLN
INTRAMUSCULAR | Status: DC | PRN
  Administered 2019-08-30 (×4): 25 ug via INTRAVENOUS

## 2019-08-30 MED ORDER — ACETAMINOPHEN 325 MG PO TABS: 650.0000 mg | ORAL_TABLET | ORAL | Status: DC | PRN

## 2019-08-30 MED ORDER — BUPIVACAINE HCL (PF) 0.25 % IJ SOLN
INTRAMUSCULAR | Status: AC
  Filled 2019-08-30: qty 30

## 2019-08-30 MED ORDER — FAMOTIDINE 20 MG PO TABS
20.0000 mg | ORAL_TABLET | Freq: Once | ORAL | Status: AC
  Administered 2019-08-30: 20 mg via ORAL

## 2019-08-30 MED ORDER — BUPIVACAINE HCL (PF) 0.5 % IJ SOLN
INTRAMUSCULAR | Status: AC
  Filled 2019-08-30: qty 30

## 2019-08-30 MED ORDER — LACTATED RINGERS IV SOLN
INTRAVENOUS | Status: DC
  Administered 2019-08-30: 07:00:00 via INTRAVENOUS

## 2019-08-30 MED ORDER — ACETAMINOPHEN 10 MG/ML IV SOLN
INTRAVENOUS | Status: DC | PRN
  Administered 2019-08-30: 1000 mg via INTRAVENOUS

## 2019-08-30 MED ORDER — SODIUM CHLORIDE 0.9% FLUSH: 3.0000 mL | Freq: Two times a day (BID) | INTRAVENOUS | Status: DC

## 2019-08-30 MED ORDER — ACETAMINOPHEN 650 MG RE SUPP: 650.0000 mg | RECTAL | Status: DC | PRN

## 2019-08-30 SURGICAL SUPPLY — 60 items
ADH SKN CLS APL DERMABOND .7 (GAUZE/BANDAGES/DRESSINGS) ×2
APL PRP STRL LF DISP 70% ISPRP (MISCELLANEOUS) ×2
BAG DECANTER FOR FLEXI CONT (MISCELLANEOUS) ×1 IMPLANT
BINDER BREAST LRG (GAUZE/BANDAGES/DRESSINGS) ×1 IMPLANT
BINDER BREAST MEDIUM (GAUZE/BANDAGES/DRESSINGS) IMPLANT
BINDER BREAST XLRG (GAUZE/BANDAGES/DRESSINGS) IMPLANT
BINDER BREAST XXLRG (GAUZE/BANDAGES/DRESSINGS) IMPLANT
BIOPATCH WHT 1IN DISK W/4.0 H (GAUZE/BANDAGES/DRESSINGS) ×4 IMPLANT
BLADE BOVIE TIP EXT 4 (BLADE) ×2 IMPLANT
BLADE SUPER 15 ALCON (BLADE) ×2 IMPLANT
BLADE SURG 15 STRL LF DISP TIS (BLADE) ×1 IMPLANT
BLADE SURG 15 STRL SS (BLADE) ×2
BNDG GAUZE 4.5X4.1 6PLY STRL (MISCELLANEOUS) ×4 IMPLANT
BULB RESERV EVAC DRAIN JP 100C (MISCELLANEOUS) ×4 IMPLANT
CANISTER SUCT 1200ML W/VALVE (MISCELLANEOUS) ×2 IMPLANT
CHLORAPREP W/TINT 26 (MISCELLANEOUS) ×4 IMPLANT
DECANTER SPIKE VIAL GLASS SM (MISCELLANEOUS) IMPLANT
DERMABOND ADVANCED (GAUZE/BANDAGES/DRESSINGS) ×2
DERMABOND ADVANCED .7 DNX12 (GAUZE/BANDAGES/DRESSINGS) ×2 IMPLANT
DRAIN CHANNEL 19F RND (DRAIN) ×4 IMPLANT
DRAPE LAPAROTOMY TRNSV 106X77 (MISCELLANEOUS) IMPLANT
ELECT CAUTERY BLADE 6.4 (BLADE) ×2 IMPLANT
ELECT CAUTERY BLADE TIP 2.5 (TIP) ×2
ELECTRODE CAUTERY BLDE TIP 2.5 (TIP) ×1 IMPLANT
GLOVE BIO SURGEON STRL SZ 6.5 (GLOVE) ×8 IMPLANT
GOWN STRL REUS W/ TWL LRG LVL3 (GOWN DISPOSABLE) ×4 IMPLANT
GOWN STRL REUS W/TWL LRG LVL3 (GOWN DISPOSABLE) ×8
GRAFT FLEX HD 6X16 PLIABLE (Tissue) ×2 IMPLANT
IMPL EXPANDER BREAST 375CC (Breast) IMPLANT
IMPLANT BREAST 375CC (Breast) ×2 IMPLANT
IMPLANT EXPANDER BREAST 375CC (Breast) ×2 IMPLANT
IV NS 1000ML (IV SOLUTION) ×2
IV NS 1000ML BAXH (IV SOLUTION) IMPLANT
IV NS 500ML (IV SOLUTION)
IV NS 500ML BAXH (IV SOLUTION) IMPLANT
NDL FILTER BLUNT 18X1 1/2 (NEEDLE) ×2 IMPLANT
NEEDLE FILTER BLUNT 18X 1/2SAF (NEEDLE) ×2
NEEDLE FILTER BLUNT 18X1 1/2 (NEEDLE) ×2 IMPLANT
NEEDLE HYPO 22GX1.5 SAFETY (NEEDLE) IMPLANT
PACK BASIN MAJOR ARMC (MISCELLANEOUS) ×2 IMPLANT
PACK UNIVERSAL (MISCELLANEOUS) IMPLANT
PAD ABD DERMACEA PRESS 5X9 (GAUZE/BANDAGES/DRESSINGS) ×8 IMPLANT
PIN SAFETY STRL (MISCELLANEOUS) ×2 IMPLANT
SET ASEPTIC TRANSFER (MISCELLANEOUS) ×6 IMPLANT
SPONGE LAP 18X18 RF (DISPOSABLE) ×6 IMPLANT
SUT MNCRL 4-0 (SUTURE) ×4
SUT MNCRL 4-0 27XMFL (SUTURE) ×2
SUT MNCRL+ 5-0 UNDYED PC-3 (SUTURE) ×3 IMPLANT
SUT MONOCRYL 5-0 (SUTURE) ×3
SUT PDS AB 1 TP1 96 (SUTURE) ×1 IMPLANT
SUT PDS PLUS 2 (SUTURE) ×8
SUT PDS PLUS AB 2-0 CT-1 (SUTURE) ×6 IMPLANT
SUT SILK 3-0 (SUTURE) ×1 IMPLANT
SUT SILK 4 0 SH (SUTURE) ×4 IMPLANT
SUT VIC AB 3-0 SH 27 (SUTURE)
SUT VIC AB 3-0 SH 27X BRD (SUTURE) IMPLANT
SUTURE MNCRL 4-0 27XMF (SUTURE) ×2 IMPLANT
SYR 10ML LL (SYRINGE) ×4 IMPLANT
SYR BULB IRRIG 60ML STRL (SYRINGE) ×2 IMPLANT
TOWEL OR 17X26 4PK STRL BLUE (TOWEL DISPOSABLE) ×2 IMPLANT

## 2019-08-30 NOTE — Op Note (Addendum)
Op report    DATE OF OPERATION:  08/30/2019  LOCATION: [facility]  SURGICAL DIVISION: Plastic Surgery  PREOPERATIVE DIAGNOSES:  1. Right Breast cancer.    POSTOPERATIVE DIAGNOSES:  1. Right Breast cancer.   PROCEDURE:  1. Bilateral breast reconstruction with placement of Acellular Dermal Matrix and tissue expanders.  SURGEON: Seraj Dunnam Sanger Janelys Glassner, DO  ASSISTANT: Roetta Sessions, PA  ANESTHESIA:  General.   COMPLICATIONS: None.   IMPLANTS: Left - Mentor 375 cc. Ref [id number].  Serial Number [id number], 100 cc of injectable saline placed in the expander. Right - Mentor 375 cc. Ref [id number].  Serial Number [id number], 100 cc of injectable saline placed in the expander. Acellular Dermal Matrix - Flex HD 6 x 16 cm two  INDICATIONS FOR PROCEDURE:  The patient, Megan Vega, is a 63 y.o. female born on 1956-01-29, is here for  immediate first stage breast reconstruction with placement of bilateral tissue expander and Acellular dermal matrix. MRN: [id number]  CONSENT:  Informed consent was obtained directly from the patient. Risks, benefits and alternatives were fully discussed. Specific risks including but not limited to bleeding, infection, hematoma, seroma, scarring, pain, implant infection, implant extrusion, capsular contracture, asymmetry, wound healing problems, and need for further surgery were all discussed. The patient did have an ample opportunity to have her questions answered to her satisfaction.   DESCRIPTION OF PROCEDURE:  The patient was taken to the operating room. SCDs were placed and IV antibiotics were given. The patient's chest was prepped and draped in a sterile fashion. A time out was performed and the implants to be used were identified.     Right:  The #10 blade was used to make the same incision.  The bovie was used to dissect to the pectoralis muscle.  The superior and inferior flaps were release. The pectoralis major muscle was  lifted from the chest wall with release of the lateral edge and lateral inframammary fold.  The pocket was irrigated with antibiotic solution and hemostasis was achieved with electrocautery.  There was significant scar tissue and a sample was sent to path. The ADM was then prepared according to the manufacture guidelines and slits placed to help with postoperative fluid management.  The ADM was then sutured to the inferior and lateral edge of the inframammary fold with 2-0 PDS starting with an interrupted stitch and then a running stitch.  The lateral portion was sutured to with interrupted sutures after the expander was placed.  The expander was prepared according to the manufacture guidelines, the air evacuated and then it was placed under the ADM and pectoralis major muscle.  The inferior and lateral tabs were used to secure the expander to the chest wall with 2-0 PDS.  The drain was placed at the inframammary fold over the ADM and secured to the skin with 3-0 Silk.  The deep layers were closed with 3-0 Monocryl followed by 4-0 Monocryl.  The skin was closed with 5-0 Monocryl and then dermabond was applied.    Left:  The local was injected into the previous mastectomy scars on both sides.  The #10 blade was used to make the same incision.  The bovie was used to dissect to the pectoralis muscle.  The superior and inferior flaps were release.  The pectoralis major muscle was lifted from the chest wall with release of the lateral edge and lateral inframammary fold.  The pocket was irrigated with antibiotic solution and hemostasis was achieved with electrocautery.  The ADM was then prepared  according to the manufacture guidelines and slits placed to help with postoperative fluid management.  The ADM was then sutured to the inferior and lateral edge of the inframammary fold with 2-0 PDS starting with an interrupted stitch and then a running stitch.  The lateral portion was sutured to with interrupted sutures after  the expander was placed.  The expander was prepared according to the manufacture guidelines, the air evacuated and then it was placed under the ADM and pectoralis major muscle.  The inferior and lateral tabs were used to secure the expander to the chest wall with 2-0 PDS.  The drain was placed at the inframammary fold over the ADM and secured to the skin with 3-0 Silk.  The deep layers were closed with 3-0 Monocryl followed by 4-0 Monocryl.  The skin was closed with 5-0 Monocryl and then dermabond was applied.    The ABDs and breast binder were placed.  The patient tolerated the procedure well and there were no complications.  The patient was allowed to wake from anesthesia and taken to the recovery room in satisfactory condition.   The advanced practice practitioner (APP) assisted throughout the case.  The APP was essential in retraction and counter traction when needed to make the case progress smoothly.  This retraction and assistance made it possible to see the tissue plans for the procedure.  The assistance was needed for blood control, tissue re-approximation and assisted with closure of the incision site.

## 2019-08-30 NOTE — Anesthesia Post-op Follow-up Note (Signed)
Anesthesia QCDR form completed.        

## 2019-08-30 NOTE — Research (Signed)
T/C made to patient Megan Vega to assess solicited AEs and complete her 6 month study visit. Patient was seen in clinic on 10/05 2020 by Dr. Mike Gip for her physical assessment, PS, weight, B/P and pulse. Patient has not had any imaging since her last study visit; however Dr. Mike Gip did perform a breast exam while she was in clinic last week. Patient states she has finished her Aspirin/placebo study drug and has been out of it for several days. States she did not call because she knew she would have to be off of it for her surgery anyway. She had breast reconstruction surgery with tissue expanders early this morning. Reviewed list of solicited AEs for the [name] study with patient and she denies any of these symptoms other than occasional indigestion after eating spicy foods.She also reports other AEs continuing from baseline including: mild, chronic tremors, some mild sensory neuropathy in tips of fingers and toes and reports occasional migraine headaches. Megan Vega denies any use of Aspirin or NSAIDs other than her study drug. Patient states she will come to clinic next week and pick up a new bottle of Aspirin/placebo and bring her completed medication calendars to exchange. All AEs with grade and attribution are listed below:  Study/Protocol: [id number] [name] Cycle: 6 month Event Grade Onset Date Resolved Date Drug Name Attribution Treatment Comments  GI Bleeding 0        Intracranial hemorrhage 0        Epistaxis 0        Hematuria 0        Dyspepsia 1      Only occasionally after eating spicy foods  Gastritis 0        Bruising 0        Chronic tremors 1 baseline   unrelated    Peripheral sensory neuropathy 1 baseline   unrelated B-complex vitamins Residual from chemotherapy  Occasional migraines 1 baseline   unrelated prn Excedrin migraine   Lymphedema in Rt upper arm 1 05/24/2019   mastectomy Physical therapy "mild" per MD  [name], BSN, MHA, OCN 08/30/2019 4:56 PM

## 2019-08-30 NOTE — Interval H&P Note (Signed)
History and Physical Interval Note:  08/30/2019 7:25 AM  Megan Vega  has presented today for surgery, with the diagnosis of Primary Cancer Of Upper Outer Quadrant Of Right Breast.  The various methods of treatment have been discussed with the patient and family. After consideration of risks, benefits and other options for treatment, the patient has consented to  Procedure(s) with comments: BREAST RECONSTRUCTION WITH PLACEMENT OF TISSUE EXPANDER AND FLEX HD (ACELLULAR HYDRATED DERMIS) (Bilateral) - 2.5 hours, please as a surgical intervention.  The patient's history has been reviewed, patient examined, no change in status, stable for surgery.  I have reviewed the patient's chart and labs.  Questions were answered to the patient's satisfaction.     Loel Lofty Vani Gunner

## 2019-08-30 NOTE — Anesthesia Preprocedure Evaluation (Signed)
Anesthesia Evaluation  Patient identified by MRN, date of birth, ID band Patient awake    Reviewed: Allergy & Precautions, H&P , NPO status , Patient's Chart, lab work & pertinent test results  History of Anesthesia Complications Negative for: history of anesthetic complications  Airway Mallampati: III  TM Distance: >3 FB Neck ROM: full    Dental no notable dental hx. (+) Teeth Intact   Pulmonary neg pulmonary ROS, neg shortness of breath, neg COPD, neg recent URI, former smoker,           Cardiovascular (-) angina+ Peripheral Vascular Disease  (-) Past MI, (-) Cardiac Stents and (-) CABG negative cardio ROS  (-) dysrhythmias      Neuro/Psych  Headaches, neg Seizures  Neuromuscular disease (peripheral neuropathy) negative psych ROS   GI/Hepatic negative GI ROS, Neg liver ROS,   Endo/Other  negative endocrine ROS  Renal/GU negative Renal ROS  negative genitourinary   Musculoskeletal   Abdominal   Peds  Hematology negative hematology ROS (+)   Anesthesia Other Findings Past Medical History: No date: Ankle fracture No date: Cancer [facility])     Comment:  Basal Cell Carcinoma, about 2011 chest No date: Cataract No date: Family history of adverse reaction to anesthesia     Comment:  DAUGHTER-N/V No date: Hemorrhoids 2015: History of methicillin resistant staphylococcus aureus (MRSA) No date: Migraines     Comment:  MIGRAINES  Past Surgical History: 10/29/2017: BREAST BIOPSY; Bilateral     Comment:  L breast DCIS, R breast invasive mammary carcinoma of no              special type, ER/PR+, Her2/neu 1+ No date: CESAREAN SECTION 2012: COLONOSCOPY 03/12/2018: IRRIGATION AND DEBRIDEMENT ABSCESS; Right     Comment:  Procedure: IRRIGATION AND DEBRIDEMENT RIGHT AXILLARY               ABSCESS;  Surgeon: Robert Bellow, MD;  Location:               [facility] ORS;  Service: General;  Laterality: Right; No date: MANDIBLE  FRACTURE SURGERY 11/28/2017: PORTACATH PLACEMENT; Left     Comment:  Procedure: INSERTION PORT-A-CATH;  Surgeon: Robert Bellow, MD;  Location: [facility] ORS;  Service: General;                Laterality: Left;     Reproductive/Obstetrics negative OB ROS                             Anesthesia Physical  Anesthesia Plan  ASA: II  Anesthesia Plan: General   Post-op Pain Management:    Induction: Intravenous  PONV Risk Score and Plan: 3 and Ondansetron, Dexamethasone, Midazolam and Treatment may vary due to age or medical condition  Airway Management Planned: LMA  Additional Equipment:   Intra-op Plan:   Post-operative Plan: Extubation in OR  Informed Consent: I have reviewed the patients History and Physical, chart, labs and discussed the procedure including the risks, benefits and alternatives for the proposed anesthesia with the patient or authorized representative who has indicated his/her understanding and acceptance.     Dental Advisory Given  Plan Discussed with: Anesthesiologist, CRNA and Surgeon  Anesthesia Plan Comments:         Anesthesia Quick Evaluation

## 2019-08-30 NOTE — Transfer of Care (Signed)
Immediate Anesthesia Transfer of Care Note  Patient: Audelia Hives  Procedure(s) Performed: BREAST RECONSTRUCTION WITH PLACEMENT OF TISSUE EXPANDER AND FLEX HD (ACELLULAR HYDRATED DERMIS) (Bilateral Breast)  Patient Location: PACU  Anesthesia Type:general  Level of Consciousness: awake  Airway & Oxygen Therapy: Patient Spontanous Breathing  Post-op Assessment: Report given to RN  Post vital signs: Reviewed  Last Vitals:  Vitals Value Taken Time  BP 137/78 08/30/19 1014  Temp    Pulse 92 08/30/19 1017  Resp 16 08/30/19 1017  SpO2 100 % 08/30/19 1017  Vitals shown include unvalidated device data.  Last Pain:  Vitals:   08/30/19 0613  TempSrc: Tympanic  PainSc: 0-No pain         Complications: No apparent anesthesia complications

## 2019-08-30 NOTE — OR Nursing (Signed)
Dr. Marla Roe in to see pt in postop 1215 pm.

## 2019-08-30 NOTE — Discharge Instructions (Addendum)
INSTRUCTIONS FOR AFTER BREAST SURGERY   You are getting ready to undergo breast surgery.  You will likely have some questions about what to expect following your operation.  The following information will help you and your family understand what to expect when you are discharged from the hospital.  Following these guidelines will help ensure a smooth recovery and reduce risks of complications.   Postoperative instructions include information on: diet, wound care, medications and physical activity.  AFTER SURGERY Expect to go home after the procedure.  In some cases, you may need to spend one night in the hospital for observation.  DIET Breast surgery does not require a specific diet.  However, I have to mention that the healthier you eat the better your body can start healing. It is important to increasing your protein intake.  This means limiting the foods with sugar and carbohydrates.  Focus on vegetables and some meat.  If you have any liposuction during your procedure be sure to drink water.  If your urine is bright yellow, then it is concentrated, and you need to drink more water.  As a general rule after surgery, you should have 8 ounces of water every hour while awake.  If you find you are persistently nauseated or unable to take in liquids let us know.  NO TOBACCO USE or EXPOSURE.  This will slow your healing process and increase the risk of a wound.  WOUND CARE If you don't have a drain:  You can shower the day after surgery. Use fragrance free soap.  Dial, [place] and [place] are usually mild on the skin. If you have a drain: You can shower five days after surgery.  Clean with baby wipes until the drain is removed.    If you have steri-strips / tape directly attached to your skin leave them in place. It is OK to get these wet.  No baths, pools or hot tubs for two weeks. We close your incision to leave the smallest and best-looking scar. No ointment or creams on your incisions until given the go  ahead.  Especially not Neosporin (Too many skin reactions with this one).  A few weeks after surgery you can use Mederma and start massaging the scar. We ask you to wear your binder or sports bra for the first 6 weeks around the clock, including while sleeping. This provides added comfort and helps reduce the fluid accumulation at the surgery site.  ACTIVITY No heavy lifting until cleared by the doctor.  This usually means no more than a half-gallon of milk.  It is OK to walk and climb stairs. In fact, moving your legs is very important to decrease your risk of a blood clot.  It will also help keep you from getting deconditioned.  Every 1 to 2 hours get up and walk for 5 minutes. This will help with a quicker recovery back to normal.  Let pain be your guide so you don't do too much.  NO, you cannot do the spring cleaning and don't plan on taking care of anyone else.  This is your time for TLC.  You will be more comfortable if you sleep and rest with your head elevated either with a few pillows under you or in a recliner.  No stomach sleeping for a few months.  WORK Everyone returns to work at different times. As a rough guide, most people take at least 1 - 2 weeks off prior to returning to work. If you need documentation  for your job, bring the forms to your postoperative follow up visit.  DRIVING Arrange for someone to bring you home from the hospital.  You may be able to drive a few days after surgery but not while taking any narcotics or valium.  BOWEL MOVEMENTS Constipation can occur after anesthesia and while taking pain medication.  It is important to stay ahead for your comfort.  We recommend taking Milk of Magnesia (2 tablespoons; twice a day) while taking the pain pills.  SEROMA This is fluid your body tried to put in the surgical site.  This is normal but if it creates tight skinny skin let us know.  It usually decreases in a few weeks.  WHEN TO CALL Call your surgeon's office if any of  the following occur:  Fever 101 degrees F or greater  Excessive bleeding or fluid from the incision site.  Pain that increases over time without aid from the medications  Redness, warmth, or pus draining from incision sites  Persistent nausea or inability to take in liquids  Severe misshapen area that underwent the operation.  Here are some resources:  1. Plastic surgery website: https://www.plasticsurgery.org/for-medical-professionals/education-and-resources/publications/breast-reconstruction-magazine 2. Breast Reconstruction Awareness Campaign:  HotelLives.co.nz 3. Plastic surgery Implant information:  https://www.plasticsurgery.org/patient-safety/breast-implant-safety  [facility] Plastic Surgery Specialist  What is the benefit of having a drain?  During surgery your tissue layers are separated.  This raw surface stimulates your body to fill the space with serous fluid.  This is normal but you don't want that fluid to collect and prevent healing.  A fluid collection can also become infected.  The Jackson-Pratt (JP) drain is used to eliminate this collection of fluid and allow the tissue to heal together.    Jackson-Pratt (JP) bulb    How to care for your drainage and suction unit at home Your drainage catheter will be connected to a collection device. The vacuum caused when the device is compressed allows drainage to collect in the device.     Wash your hands with soap and water before and after touching the system.  Empty the JP drain every 12 hours once you get home from your procedure.  Record the fluid amount on the record sheet included.  Start with stripping the drain tube to push the clots or excess fluid to the bulb.  Do this by pinching the tube with one hand near your skin.  Then with the other hand squeeze the tubing and work it toward the bulb.  This should be done several times a day.  This may collapse the tube which will correct on its own.    Use a  safety pin to attach your collection device to your clothing so there is no tension on the insertion site.    If you have drainage at the skin insertion site, you can apply a gauze dressing and secure it with tape.  If the drain falls out, apply a gauze dressing over the drain insertion site and secure with tape.   To empty the collection device:    Release the stopper on the top of the collection unit (bulb).   Pour contents into a measuring container such as a plastic medicine cup.   Record the day and amount of drainage on the attached sheet.  This should be done at least twice a day.    To compress the Jackson-Pratt Bulb:   Release the stopper at the top of the bulb.  Squeeze the bulb tightly in your fist, squeezing air out  of the bulb.   Replace the stopper while the bulb is compressed.   Be careful not to spill the contents when squeezing the bulb.  The drainage will start bright red and turn to pink and then yellow with time.  IMPORTANT: If the bulb is not squeezed before adding the stopper it will not draw out the fluid.  Care for the JP drain site and your skin daily:   You may shower three days after surgery.  Secure the drain to a ribbon or cloth around your waist while showering so it does not pull out while showering.  Be sure your hands are cleaned with soap and water.  Use a clean wet cotton swab to clean the skin around the drain site.   Use another cotton swab to place Vaseline or antibiotic ointment on the skin around the drain.     Contact your physician if any of the following occur:   The fluid in the bulb becomes cloudy.  Your temperature is greater than 101.4.   The incision opens.  If you have drainage at the skin insertion site, you can apply a gauze dressing and secure it with tape.  If the drain falls out, apply a gauze dressing over the drain insertion site and secure with tape.   You will usually have more drainage when you are active  than while you rest or are asleep. If the drainage increases significantly or is bloody call the physician                             Bring this record with you to each office visit Date  Drainage Volume  Date   Drainage volume                                                                                                                                                                                           AMBULATORY SURGERY  DISCHARGE INSTRUCTIONS   1) The drugs that you were given will stay in your system until tomorrow so for the next 24 hours you should not:  A) Drive an automobile B) Make any legal decisions C) Drink any alcoholic beverage   2) You may resume regular meals tomorrow.  Today it is better to start with liquids and gradually work up to solid foods.  You may eat anything you prefer, but it is better to start with liquids, then soup and crackers, and gradually work up to solid foods.   3) Please notify your doctor immediately if you have any unusual bleeding, trouble breathing, redness and pain  at the surgery site, drainage, fever, or pain not relieved by medication.    4) Additional Instructions:        Please contact your physician with any problems or Same Day Surgery at [phone], Monday through Friday 6 am to 4 pm, or [place] [facility] number at [phone].

## 2019-08-30 NOTE — Anesthesia Procedure Notes (Signed)
Procedure Name: LMA Insertion Date/Time: 08/30/2019 10:18 AM Performed by: Timoteo Expose, CRNA Pre-anesthesia Checklist: Patient identified, Suction available, Timeout performed, Patient being monitored and Emergency Drugs available Patient Re-evaluated:Patient Re-evaluated prior to induction Oxygen Delivery Method: Circle system utilized Induction Type: IV induction LMA: LMA inserted LMA Size: 4.0

## 2019-08-31 LAB — SURGICAL PATHOLOGY

## 2019-08-31 NOTE — Anesthesia Postprocedure Evaluation (Signed)
Anesthesia Post Note  Patient: Megan Vega  Procedure(s) Performed: BREAST RECONSTRUCTION WITH PLACEMENT OF TISSUE EXPANDER AND FLEX HD (ACELLULAR HYDRATED DERMIS) (Bilateral Breast)  Patient location during evaluation: PACU Anesthesia Type: General Level of consciousness: awake and alert Pain management: pain level controlled Vital Signs Assessment: post-procedure vital signs reviewed and stable Respiratory status: spontaneous breathing, nonlabored ventilation, respiratory function stable and patient connected to nasal cannula oxygen Cardiovascular status: blood pressure returned to baseline and stable Postop Assessment: no apparent nausea or vomiting Anesthetic complications: no     Last Vitals:  Vitals:   08/30/19 1105 08/30/19 1207  BP: 127/69 126/74  Pulse: 73 81  Resp: 16 16  Temp: (!) 36.2 C   SpO2: 99% 100%    Last Pain:  Vitals:   08/31/19 0807  TempSrc:   PainSc: 3                  Martha Clan

## 2019-09-07 ENCOUNTER — Encounter: Payer: Self-pay | Admitting: Surgical

## 2019-09-07 ENCOUNTER — Other Ambulatory Visit: Payer: Self-pay

## 2019-09-07 ENCOUNTER — Encounter: Payer: Self-pay | Admitting: *Deleted

## 2019-09-07 ENCOUNTER — Encounter: Payer: 59 | Admitting: Surgical

## 2019-09-07 ENCOUNTER — Ambulatory Visit (INDEPENDENT_AMBULATORY_CARE_PROVIDER_SITE_OTHER): Payer: 59 | Admitting: Surgical

## 2019-09-07 VITALS — BP 133/82 | HR 92 | Temp 97.3°F | Ht 65.0 in | Wt 151.0 lb

## 2019-09-07 DIAGNOSIS — C50411 Malignant neoplasm of upper-outer quadrant of right female breast: Secondary | ICD-10-CM

## 2019-09-07 NOTE — Research (Signed)
Patient Megan Vega returned to clinic this afternoon for purpose of obtaining a new bottle of Aspirin/placebo on the A011502 [name] study. Ms. Megan Vega ran out of IP after her dose on 08/18/2019; however she did not call because she knew she was going to have to be off the study drug anyway due to an upcoming surgical procedure on 08/30/19. Patient is now OK to restart the study drug, but she did not return her empty IP bottle. She was dispensed patient specific aspirin/placebo - 300mg  tablets, #200 with study ID [id number] on the label, which was verified by myself and Dicie Beam, RPh. Patient instructed to take the study drug at the same time daily with food or a full glass of water. States she usually takes her study drug in the evening with supper so that she does not have an empty stomach. Megan Vega returned her completed medication calendars and was provided with new calendars to last her through April 2021. Patient was seen by Dr. Mike Gip on 08/23/2019 for H&P and her solicited AEs were collected by phone after that visit. She will return to see Dr. Mike Gip in January, 2021, and for her 12 month study visit in early April of 2021. [name], BSN, MHA, OCN 09/07/2019 2:34 PM

## 2019-09-07 NOTE — Progress Notes (Signed)
   Subjective:     Patient ID: Megan Vega, female    DOB: 1955/11/27, 63 y.o.   MRN: [id number]  Chief Complaint  [name]    HPI: The patient is a 63 y.o. female here for follow-up after bilateral breast reconstruction with placement of tissue expanders and ADM on 08/30/19.  Less than 20 cc output of serosanguinous fluid over last 3 days. Incisions c/d/i. No drainage noted. Dermabond in place. No dehiscence. No erythema, swelling, hematoma, seroma.  She has a hx of right sided radiation.   Review of Systems  Constitutional: Positive for activity change. Negative for appetite change, chills, diaphoresis, fatigue and fever.  Respiratory: Negative for cough, shortness of breath and wheezing.   Gastrointestinal: Negative for abdominal pain, constipation, diarrhea, nausea and vomiting.  Skin: Negative for color change, pallor, rash and wound.  Neurological: Negative for dizziness and weakness.     Objective:   Vital Signs BP 133/82 (BP Location: Left Arm, Patient Position: Sitting, Cuff Size: Normal)   Pulse 92   Temp (!) 97.3 F (36.3 C) (Temporal)   Ht 5\' 5"  (1.651 m)   Wt 151 lb (68.5 kg)   SpO2 96%   BMI 25.13 kg/m  Vital Signs and Nursing Note Reviewed Chaperone present Physical Exam  Constitutional: She is oriented to person, place, and time and well-developed, well-nourished, and in no distress.  HENT:  Head: Normocephalic and atraumatic.  Cardiovascular: Normal rate.  Pulmonary/Chest: Effort normal.    Musculoskeletal: Normal range of motion.  Neurological: She is alert and oriented to person, place, and time. Gait normal.  Skin: Skin is warm and dry. No rash noted. She is not diaphoretic. No erythema. No pallor.  Psychiatric: Mood and affect normal.      Assessment/Plan:     ICD-10-CM   1. Primary cancer of upper outer quadrant of right breast [facility])  C50.411     Mrs. haire is doing well. Her incisions are healing well.  She is very tight and there is very little laxity in her breast skin over the expanders. Due to radiation on the right, it would be best to slowly expand to prevent dehiscence and wound development. It is also possible, she still may develop a wound due to radiation damage to skin/vessels. Patient is aware and understands.  Per patient, oncology is okay with port removal at exchange surgery  We placed injectable saline in the Expander using a sterile technique: Right: 25 cc for a total of 125 / 375 cc Left: 25 cc for a total of 125 / 375 cc  Follow up in 2 weeks.  Carola Rhine Artie Mcintyre, PA-C 09/07/2019, 1:24 PM

## 2019-09-18 ENCOUNTER — Other Ambulatory Visit: Payer: Self-pay | Admitting: Hematology and Oncology

## 2019-09-18 DIAGNOSIS — G620 Drug-induced polyneuropathy: Secondary | ICD-10-CM

## 2019-09-18 DIAGNOSIS — T451X5A Adverse effect of antineoplastic and immunosuppressive drugs, initial encounter: Secondary | ICD-10-CM

## 2019-09-20 ENCOUNTER — Telehealth: Payer: Self-pay

## 2019-09-20 NOTE — Telephone Encounter (Signed)
-----   Message from Lequita Asal, MD sent at 09/19/2019 10:29 AM EST ----- Regarding: Please call patient  Electronic request for refill of gabapentin came in.  Med list indicated she was not taking on 08/30/2019.  Do we need to refill?  M

## 2019-09-20 NOTE — Telephone Encounter (Signed)
Attempted to contact patient to inquire if Gabapentin refill is needed. Left VM requesting callback.

## 2019-09-20 NOTE — Telephone Encounter (Signed)

## 2019-09-21 ENCOUNTER — Ambulatory Visit (INDEPENDENT_AMBULATORY_CARE_PROVIDER_SITE_OTHER): Payer: 59 | Admitting: Surgical

## 2019-09-21 ENCOUNTER — Other Ambulatory Visit: Payer: Self-pay

## 2019-09-21 ENCOUNTER — Encounter: Payer: Self-pay | Admitting: Surgical

## 2019-09-21 VITALS — BP 119/77 | HR 74 | Temp 97.5°F | Ht 65.0 in | Wt 156.2 lb

## 2019-09-21 DIAGNOSIS — C50411 Malignant neoplasm of upper-outer quadrant of right female breast: Secondary | ICD-10-CM

## 2019-09-21 NOTE — Progress Notes (Signed)
   Subjective:     Patient ID: Megan Vega, female    DOB: 12/17/1955, 63 y.o.   MRN: [id number]  Chief Complaint  [name]    2 weeks   HPI: The patient is a 63 y.o. female here for follow-up after bilateral breast reconstruction with placement of tissue expanders on 08/30/19. She has a hx of right breast radiation.  She reports she is doing well. Tolerated last fill without any issue and hardly noticed any tightness. Her incisions are healing well, dermabond slowly coming off. No dehiscence noted. Incision c/d/i. No sign of seroma, hematoma, infection noted.  Denies fever, chills, n/v.   Review of Systems  Constitutional: Positive for activity change. Negative for appetite change, chills, diaphoresis, fatigue and fever.  Respiratory: Negative.   Cardiovascular: Negative.   Musculoskeletal: Positive for myalgias.  Skin: Negative.   Neurological: Negative.     Objective:   Vital Signs BP 119/77 (BP Location: Left Arm, Patient Position: Sitting, Cuff Size: Normal)   Pulse 74   Temp (!) 97.5 F (36.4 C) (Temporal)   Ht 5\' 5"  (1.651 m)   Wt 156 lb 3.2 oz (70.9 kg)   SpO2 97%   BMI 25.99 kg/m  Vital Signs and Nursing Note Reviewed Chaperone present Physical Exam  Constitutional: She is oriented to person, place, and time and well-developed, well-nourished, and in no distress.  HENT:  Head: Normocephalic and atraumatic.  Cardiovascular: Normal rate.  Pulmonary/Chest: Effort normal.    Musculoskeletal: Normal range of motion.  Neurological: She is alert and oriented to person, place, and time. Gait normal.  Skin: Skin is warm and dry. No rash noted. She is not diaphoretic. No erythema. No pallor.  Psychiatric: Mood and affect normal.    Assessment/Plan:     ICD-10-CM   1. Primary cancer of upper outer quadrant of right breast [facility])  C50.411    Mrs. Ghali is doing well. Incisions healing well. Her overlying skin is very tight, but she  tolerated last fill well. No sign of seroma, hematoma, infection.  We placed injectable saline in the Expander using a sterile technique: Right: 30 cc for a total of 155 / 375 cc Left: 30 cc for a total of 155 / 375 cc  Follow up in 2 weeks for additional fill. Patient would like to be a large A prior to exchange. We talked about the process of exchange and minimum time to allow ADM to incorporate being ~ 3 months.  Carola Rhine Nayzeth Altman, PA-C 09/21/2019, 2:18 PM

## 2019-10-05 ENCOUNTER — Encounter: Payer: Self-pay | Admitting: Surgical

## 2019-10-05 ENCOUNTER — Ambulatory Visit (INDEPENDENT_AMBULATORY_CARE_PROVIDER_SITE_OTHER): Payer: 59 | Admitting: Surgical

## 2019-10-05 ENCOUNTER — Other Ambulatory Visit: Payer: Self-pay

## 2019-10-05 VITALS — BP 123/82 | HR 75 | Temp 97.3°F | Ht 65.0 in | Wt 156.4 lb

## 2019-10-05 DIAGNOSIS — C50411 Malignant neoplasm of upper-outer quadrant of right female breast: Secondary | ICD-10-CM

## 2019-10-05 NOTE — Progress Notes (Signed)
   Subjective:     Patient ID: Audelia Hives, female    DOB: [birth year], 63 y.o.   MRN: [id number]  Chief Complaint  [name]    2 weeks    HPI: The patient is a 63 y.o. female here for follow-up after bilateral breast reconstruction. She has had right breast radiation.  She reports that overall she tolerated last fill well.  Her incisions are well-healed.  There is no sign of infection, seroma, hematoma.  She has a left chest wall port in place.  Her skin is very tight over the expander, but she can tolerate a little bit more of an expansion.  Her skin flaps are well perfused and there is no sign of necrosis.  She denies any recent fever, chills, nausea, vomiting.  Review of Systems  Constitutional: Positive for activity change.  Respiratory: Negative.   Cardiovascular: Negative.   Gastrointestinal: Negative.   Genitourinary: Negative.   Musculoskeletal: Positive for myalgias.  Neurological: Negative.      Objective:   Vital Signs BP 123/82 (BP Location: Left Arm, Patient Position: Sitting, Cuff Size: Normal)   Pulse 75   Temp (!) 97.3 F (36.3 C) (Temporal)   Ht 5\' 5"  (1.651 m)   Wt 156 lb 6.4 oz (70.9 kg)   SpO2 96%   BMI 26.03 kg/m  Vital Signs and Nursing Note Reviewed Chaperone present Physical Exam  Constitutional: She is oriented to person, place, and time and well-developed, well-nourished, and in no distress.  HENT:  Head: Normocephalic and atraumatic.  Cardiovascular: Normal rate.  Pulmonary/Chest: Effort normal.    Musculoskeletal: Normal range of motion.  Neurological: She is alert and oriented to person, place, and time. Gait normal.  Skin: Skin is warm and dry. No rash noted. She is not diaphoretic. No erythema. No pallor.  Psychiatric: Mood and affect normal.    Assessment/Plan:     ICD-10-CM   1. Primary cancer of upper outer quadrant of right breast [facility])  C50.411    Mrs. Fones is doing well.  Her breast flaps  are very tight, after today's fill, she may be limited to any more fills depending on her response to today's.  She has a history of radiation on the right which is a limiting factor.  We placed injectable saline in the Expander using a sterile technique: Right: 25 cc for a total of 180 / 375 cc Left: 25 cc for a total of 180 / 375 cc  Plan for follow up in 2 weeks.  Carola Rhine Tesha Archambeau, PA-C 10/05/2019, 2:49 PM

## 2019-10-06 ENCOUNTER — Encounter: Payer: Self-pay | Admitting: Hematology and Oncology

## 2019-10-06 ENCOUNTER — Other Ambulatory Visit: Payer: Self-pay | Admitting: *Deleted

## 2019-10-06 DIAGNOSIS — T451X5A Adverse effect of antineoplastic and immunosuppressive drugs, initial encounter: Secondary | ICD-10-CM

## 2019-10-06 DIAGNOSIS — G620 Drug-induced polyneuropathy: Secondary | ICD-10-CM

## 2019-10-07 MED ORDER — GABAPENTIN 100 MG PO CAPS: 200.0000 mg | ORAL_CAPSULE | Freq: Every day | ORAL | 1 refills | Status: DC

## 2019-10-21 ENCOUNTER — Other Ambulatory Visit: Payer: Self-pay

## 2019-10-21 ENCOUNTER — Ambulatory Visit (INDEPENDENT_AMBULATORY_CARE_PROVIDER_SITE_OTHER): Payer: 59 | Admitting: Surgical

## 2019-10-21 ENCOUNTER — Encounter: Payer: Self-pay | Admitting: Surgical

## 2019-10-21 VITALS — BP 119/79 | HR 89 | Temp 98.8°F | Ht 65.0 in | Wt 156.0 lb

## 2019-10-21 DIAGNOSIS — C50411 Malignant neoplasm of upper-outer quadrant of right female breast: Secondary | ICD-10-CM

## 2019-10-21 NOTE — Progress Notes (Signed)
   Subjective:     Patient ID: Megan Vega, female    DOB: 13-Jun-1956, 63 y.o.   MRN: [id number]  Chief Complaint  [name]    HPI: The patient is a 63 y.o. female here for follow-up for expander fill after bilateral breast reconstruction.  She has a history of right breast radiation.  Last fill was very tight, today we will evaluate ability of her flaps to safely have any additional expansion.  Her right breast is very tight, unable to fill today due to tightness.  She reports she had a great Thanksgiving, she has been feeling really well.  She is very pleased with her healing progress.  She is concerned that her breasts are smaller than she would like, but she reports that she was aware that this was possible because her flaps were tight and she did not have a delayed reconstruction.  She has not had any fevers, chills, nausea, vomiting.   Review of Systems  Constitutional: Negative for activity change, appetite change, chills, diaphoresis, fatigue and fever.  Respiratory: Negative for shortness of breath.   Cardiovascular: Negative for chest pain, palpitations and leg swelling.  Gastrointestinal: Negative for abdominal pain, nausea and vomiting.  Musculoskeletal: Negative for arthralgias and myalgias.  Skin: Negative for color change, pallor, rash and wound.     Objective:   Vital Signs BP 119/79 (BP Location: Left Arm, Patient Position: Sitting, Cuff Size: Normal)   Pulse 89   Temp 98.8 F (37.1 C) (Temporal)   Ht 5\' 5"  (1.651 m)   Wt 156 lb (70.8 kg)   SpO2 97%   BMI 25.96 kg/m  Vital Signs and Nursing Note Reviewed Chaperone present Physical Exam  Constitutional: She is oriented to person, place, and time and well-developed, well-nourished, and in no distress.  HENT:  Head: Normocephalic and atraumatic.  Cardiovascular: Normal rate.  Pulmonary/Chest: Effort normal.  Musculoskeletal: Normal range of motion.  Neurological: She is alert  and oriented to person, place, and time. Gait normal.  Skin: Skin is warm and dry. No rash noted. She is not diaphoretic. No erythema. No pallor.  Psychiatric: Mood and affect normal.      Assessment/Plan:     ICD-10-CM   1. Primary cancer of upper outer quadrant of right breast [facility])  C50.411     Mrs. Broadwell is healing really well, but her mastectomy flaps are tight  We DID NOT place injectable saline in the Expander using a sterile technique: Right: 0 cc for a total of 180 / 375 cc Left: 0 cc for a total of 180 / 375 cc I did not think it was safe to fill her right breast expander with more saline due to the overlying flap being very tight.  Patient to follow-up in 2 weeks for reevaluation with Dr. Marla Roe for surgical planning.   Pictures were obtained of the patient and placed in the chart with the patient's or guardian's permission.  Carola Rhine Laronica Bhagat, PA-C 10/21/2019, 3:32 PM

## 2019-11-02 ENCOUNTER — Encounter: Payer: Self-pay | Admitting: Hematology and Oncology

## 2019-11-02 ENCOUNTER — Other Ambulatory Visit: Payer: Self-pay

## 2019-11-02 DIAGNOSIS — T451X5A Adverse effect of antineoplastic and immunosuppressive drugs, initial encounter: Secondary | ICD-10-CM

## 2019-11-02 DIAGNOSIS — G620 Drug-induced polyneuropathy: Secondary | ICD-10-CM

## 2019-11-02 DIAGNOSIS — C50411 Malignant neoplasm of upper-outer quadrant of right female breast: Secondary | ICD-10-CM

## 2019-11-03 ENCOUNTER — Other Ambulatory Visit: Payer: Self-pay

## 2019-11-03 DIAGNOSIS — C50411 Malignant neoplasm of upper-outer quadrant of right female breast: Secondary | ICD-10-CM

## 2019-11-03 DIAGNOSIS — T451X5A Adverse effect of antineoplastic and immunosuppressive drugs, initial encounter: Secondary | ICD-10-CM

## 2019-11-03 DIAGNOSIS — G620 Drug-induced polyneuropathy: Secondary | ICD-10-CM

## 2019-11-03 MED ORDER — GABAPENTIN 100 MG PO CAPS: 200.0000 mg | ORAL_CAPSULE | Freq: Every day | ORAL | 1 refills | Status: DC

## 2019-11-03 MED ORDER — LETROZOLE 2.5 MG PO TABS: 2.5000 mg | ORAL_TABLET | Freq: Every day | ORAL | 0 refills | Status: DC

## 2019-11-09 ENCOUNTER — Ambulatory Visit: Payer: 59 | Admitting: Plastic Surgery

## 2019-11-16 ENCOUNTER — Encounter: Payer: Self-pay | Admitting: Plastic Surgery

## 2019-11-16 ENCOUNTER — Other Ambulatory Visit: Payer: Self-pay

## 2019-11-16 ENCOUNTER — Ambulatory Visit (INDEPENDENT_AMBULATORY_CARE_PROVIDER_SITE_OTHER): Payer: 59 | Admitting: Plastic Surgery

## 2019-11-16 VITALS — BP 140/84 | HR 94 | Temp 97.3°F | Ht 65.0 in | Wt 158.6 lb

## 2019-11-16 DIAGNOSIS — Z1211 Encounter for screening for malignant neoplasm of colon: Secondary | ICD-10-CM

## 2019-11-16 DIAGNOSIS — D0512 Intraductal carcinoma in situ of left breast: Secondary | ICD-10-CM

## 2019-11-16 NOTE — Progress Notes (Signed)
   Subjective:    Patient ID: Megan Vega, female    DOB: 03-09-1956, 63 y.o.   MRN: [id number]  The patient is a 63 year old female here for follow-up on her breast expanders and breast reconstruction.  She underwent bilateral mastectomies for right-sided breast cancer.  She then had right breast radiation.  She has been expanding very slowly due to small flaps that are also thin and because of the right sided radiation.  Her reconstruction was not immediate.  She is very pleased with her progress in regards to having some shape instead of being concaved.  She would like to be larger if it is possible.     Review of Systems  Constitutional: Negative.   HENT: Negative.   Eyes: Negative.   Respiratory: Negative.   Cardiovascular: Negative.   Gastrointestinal: Negative.   Endocrine: Negative.   Genitourinary: Negative.   Musculoskeletal: Negative.   Hematological: Negative.   Psychiatric/Behavioral: Negative.        Objective:   Physical Exam Vitals and nursing note reviewed.  Constitutional:      Appearance: Normal appearance.  HENT:     Head: Normocephalic and atraumatic.  Cardiovascular:     Rate and Rhythm: Normal rate.     Pulses: Normal pulses.  Pulmonary:     Effort: Pulmonary effort is normal. No respiratory distress.  Abdominal:     General: Abdomen is flat.  Neurological:     General: No focal deficit present.     Mental Status: She is alert and oriented to person, place, and time.  Psychiatric:        Mood and Affect: Mood normal.        Behavior: Behavior normal.        Assessment & Plan:     ICD-10-CM   1. Ductal carcinoma in situ (DCIS) of left breast  D05.12   2. Encounter for screening colonoscopy  Z12.11     We had a detailed conversation about the possible limitations of the right breast due to the radiation.  The skin today is still very tight.  I do not feel comfortable expanding it.  She completely understands.  We talked about expanding the  left side to see how big we can get it.  That will help [place] to determine if it is worth doing a latissimus on the right side to match.  I had like to see her back in 2 weeks.  Right: 0 cc for a total of 180 / 375 cc Left: 50 cc for a total of 230 / 375 cc  The 21st Century Cures Act was signed into law in 2016 which includes the topic of electronic health records.  This provides immediate access to information in MyChart.  This includes consultation notes, operative notes, office notes, lab results and pathology reports.  If you have any questions about what you read please let us know at your next visit or call us at the office.  We are right here with you.

## 2019-11-20 ENCOUNTER — Other Ambulatory Visit: Payer: Self-pay | Admitting: Hematology and Oncology

## 2019-11-20 DIAGNOSIS — C50411 Malignant neoplasm of upper-outer quadrant of right female breast: Secondary | ICD-10-CM

## 2019-11-23 NOTE — Progress Notes (Signed)
[facility]  [address], [address] [place], [place] [address] Phone: [phone]  Fax: [phone]   Telemedicine Office Visit:  11/25/2019  Referring physician: Burnard Hawthorne, FNP  I connected with Audelia Hives on 11/25/2019 at 11:37 AM by videoconferencing and verified that I was speaking with the correct person using 2 identifiers.  The patient was in her car at work.  I discussed the limitations, risk, security and privacy concerns of performing an evaluation and management service by videoconferencing and the availability of in person appointments.  I also discussed with the patient that there may be a patient responsible charge related to this service.  The patient expressed understanding and agreed to proceed.   Chief Complaint: Megan Vega is a 64 y.o. female with IIIB right breast cancer and DCIS of the left breast who is seen for 3 month assessment.   HPI: The patient was last seen in the medical oncology clinic on 08/23/2019. At that time, she was doing well.  She was planning reconstructive surgery. Exam was unremarkable. Hematocrit 43.4, hemoglobin 15.1, platelets 207,000, WBC 3,600. CMP was normal. CA 27.29 was 11.9 (normal). Patient received Zometa. She continued letrozole.   Patient underwent bilateral breast reconstruction with placement of Acellular Dermal Matrix and tissue expanders on 08/30/2019 by Dr. Marla Roe.   During the interim, she has done well.   She has been getting her expanders filled every 2 weeks. She will have surgery in the future for the implant placement.  She may only get an implant in the left breast. Her lymphedema is better. She continues to have neuropathy in her fingers and feet. She reports tingling in the bottom of her feet and hands which she attributes to being off gabapentin. She is waiting for her new pharmacy to contact her to pick up the gabapentin. She remains on Femara, calcium and vitamin D.   Her  sodium is 133 and I advised patient to drink more fluids with electrolytes. She has lost hearing in her right ear; she reports hearing a "swooshing" sound. The patient is using ear drops. The patient denies any congestion. She notes in the past she had problems with swimmers ear.  I encouraged her to follow up with ENT.   She had her port flushes on 11/24/2019. She is considering port removal.    Past Medical History:  Diagnosis Date   Ankle fracture    Cancer ([place])    Basal Cell Carcinoma, about 2011 chest   Cataract    Family history of adverse reaction to anesthesia    DAUGHTER-N/V   Hemorrhoids    History of methicillin resistant staphylococcus aureus (MRSA) 2015   Migraines    MIGRAINES occ   Personal history of chemotherapy    Personal history of radiation therapy     Past Surgical History:  Procedure Laterality Date   BREAST BIOPSY Bilateral 10/29/2017   L breast DCIS, R breast invasive mammary carcinoma of no special type, ER/PR+, Her2/neu 1+   BREAST RECONSTRUCTION WITH PLACEMENT OF TISSUE EXPANDER AND FLEX HD (ACELLULAR HYDRATED DERMIS) Bilateral 08/30/2019   Procedure: BREAST RECONSTRUCTION WITH PLACEMENT OF TISSUE EXPANDER AND FLEX HD (ACELLULAR HYDRATED DERMIS);  Surgeon: Wallace Going, DO;  Location: [facility];  Service: Plastics;  Laterality: Bilateral;  2.5 hours, please   CESAREAN SECTION     COLONOSCOPY  2012   COLONOSCOPY WITH PROPOFOL N/A 12/01/2018   Procedure: COLONOSCOPY WITH PROPOFOL;  Surgeon: Lucilla Lame, MD;  Location: [facility];  Service: Endoscopy;  Laterality: N/A;   FRACTURE SURGERY     ankle   IRRIGATION AND DEBRIDEMENT ABSCESS Right 03/12/2018   Procedure: IRRIGATION AND DEBRIDEMENT RIGHT AXILLARY ABSCESS;  Surgeon: Robert Bellow, MD;  Location: [facility];  Service: General;  Laterality: Right;   MANDIBLE FRACTURE SURGERY     MASTECTOMY     MASTECTOMY MODIFIED RADICAL Right 07/10/2018   ypT2 ypN2a ER/PR positive,  HER-2/neu negative  Surgeon: Robert Bellow, MD;  Location: [facility];  Service: General;  Laterality: Right;   PORTACATH PLACEMENT Left 11/28/2017   Procedure: INSERTION PORT-A-CATH;  Surgeon: Robert Bellow, MD;  Location: [facility];  Service: General;  Laterality: Left;   SIMPLE MASTECTOMY WITH AXILLARY SENTINEL NODE BIOPSY Left 07/10/2018   High-grade DCIS SIMPLE MASTECTOMY;  Surgeon: Robert Bellow, MD;  Location: [facility];  Service: General;  Laterality: Left;    Family History  Problem Relation Age of Onset   Cancer Mother        Esophageal Cancer   Early death Mother        Age 40   Cancer Father        Lung Cancer   Diabetes Father    Diabetes Brother        Controlled with diet   Heart attack Paternal Grandfather    Colon cancer Neg Hx     Social History:  reports that she quit smoking about 43 years ago. She has a 2.50 pack-year smoking history. She has never used smokeless tobacco. She reports current alcohol use of about 1.0 standard drinks of alcohol per week. She reports that she does not use drugs. She lives in [place]. The patient is alone today.  Participants in the patient's visit and their role in the encounter included the patient Waymon Budge, RN today.  The intake visit was provided by Waymon Budge, RN.  Allergies:  Allergies  Allergen Reactions   Peanut-Containing Drug Products Swelling and Other (See Comments)    Only when eating too many peanuts, swelling with lips and tingly face    Current Medications: Current Outpatient Medications  Medication Sig Dispense Refill   acetaminophen (TYLENOL) 500 MG tablet Take 1,000 mg by mouth every 6 (six) hours as needed for moderate pain or headache.     b complex vitamins tablet Take 1 tablet by mouth daily.     CALCIUM PO Take 1,200 mg by mouth daily. 1200 MG Tablets     gabapentin (NEURONTIN) 100 MG capsule Take 2 capsules (200 mg total) by mouth at bedtime. 60 capsule 1    letrozole (FEMARA) 2.5 MG tablet TAKE 1 TABLET BY MOUTH EVERY DAY 90 tablet 0   loratadine (CLARITIN) 10 MG tablet Take 10 mg by mouth every morning.      Melatonin 3 MG CAPS Take 3 mg by mouth at bedtime as needed (sleep.).      Multiple Vitamin (MULTIVITAMIN WITH MINERALS) TABS tablet Take 1 tablet by mouth daily. Centrum Silver     Probiotic Product (PROBIOTIC PO) Take 1 capsule by mouth daily.     tetrahydrozoline 0.05 % ophthalmic solution Place 1 drop into both eyes 3 (three) times daily as needed (for dry/red eyes.).     mupirocin ointment (BACTROBAN) 2 % Apply 1 application topically 2 (two) times daily. X 5 days to nose and bid x 7-14 days right leg (Patient not taking: Reported on 11/24/2019) 30 g 1   polyethylene glycol (MIRALAX / GLYCOLAX) packet Take 17 g  by mouth daily as needed (for constipation.).      No current facility-administered medications for this visit.   Facility-Administered Medications Ordered in Other Visits  Medication Dose Route Frequency Provider Last Rate Last Admin   heparin lock flush 100 unit/mL  500 Units Intracatheter PRN Lequita Asal, MD        Review of Systems  Constitutional: Negative for chills, diaphoresis, fever, malaise/fatigue and weight loss.       Feels "good".  HENT: Positive for hearing loss (right ear; using ear drops). Negative for congestion, ear pain, nosebleeds, sinus pain and sore throat.        "Swooshing" sound in right ear.  Eyes: Negative.  Negative for blurred vision and double vision.  Respiratory: Negative.  Negative for cough, hemoptysis, sputum production and shortness of breath.   Cardiovascular: Negative.  Negative for chest pain, palpitations, orthopnea and leg swelling.  Gastrointestinal: Negative.  Negative for abdominal pain, blood in stool, constipation, diarrhea, heartburn, melena, nausea and vomiting.  Genitourinary: Negative.  Negative for dysuria, frequency, hematuria and urgency.  Musculoskeletal:  Negative for back pain, falls, joint pain, myalgias and neck pain.       Lymphedema- better.  Skin: Negative.  Negative for rash.  Neurological: Positive for tingling (hands and feet) and sensory change (bottom of feet; hands). Negative for dizziness, tremors, speech change, focal weakness, weakness and headaches.  Endo/Heme/Allergies: Negative.  Does not bruise/bleed easily.  Psychiatric/Behavioral: Negative.  Negative for depression. The patient is not nervous/anxious.   All other systems reviewed and are negative.  Performance status (ECOG):  0  Physical Exam  Constitutional: She is oriented to person, place, and time. She appears well-developed and well-nourished.  HENT:  Head: Normocephalic and atraumatic.  Short gray hair.  Eyes: Conjunctivae and EOM are normal. No scleral icterus.  Neurological: She is alert and oriented to person, place, and time.  Psychiatric: She has a normal mood and affect. Her behavior is normal. Judgment and thought content normal.  Nursing note reviewed.   Infusion on 11/24/2019  Component Date Value Ref Range Status   Sodium 11/24/2019 133* 135 - 145 mmol/L Final   Potassium 11/24/2019 4.0  3.5 - 5.1 mmol/L Final   Chloride 11/24/2019 102  98 - 111 mmol/L Final   CO2 11/24/2019 24  22 - 32 mmol/L Final   Glucose, Bld 11/24/2019 116* 70 - 99 mg/dL Final   BUN 11/24/2019 15  8 - 23 mg/dL Final   Creatinine, Ser 11/24/2019 0.88  0.44 - 1.00 mg/dL Final   Calcium 11/24/2019 9.1  8.9 - 10.3 mg/dL Final   Total Protein 11/24/2019 7.2  6.5 - 8.1 g/dL Final   Albumin 11/24/2019 4.1  3.5 - 5.0 g/dL Final   AST 11/24/2019 26  15 - 41 U/L Final   ALT 11/24/2019 20  0 - 44 U/L Final   Alkaline Phosphatase 11/24/2019 60  38 - 126 U/L Final   Total Bilirubin 11/24/2019 1.2  0.3 - 1.2 mg/dL Final   GFR calc non Af Amer 11/24/2019 >60  >60 mL/min Final   GFR calc Af Amer 11/24/2019 >60  >60 mL/min Final   Anion gap 11/24/2019 7  5 - 15 Final    Performed at [facility] [place], [place] [address]   WBC 11/24/2019 5.0  4.0 - 10.5 K/uL Final   RBC 11/24/2019 4.74  3.87 - 5.11 MIL/uL Final   Hemoglobin 11/24/2019 13.8  12.0 - 15.0 g/dL  Final   HCT 11/24/2019 40.4  36.0 - 46.0 % Final   MCV 11/24/2019 85.2  80.0 - 100.0 fL Final   MCH 11/24/2019 29.1  26.0 - 34.0 pg Final   MCHC 11/24/2019 34.2  30.0 - 36.0 g/dL Final   RDW 11/24/2019 12.4  11.5 - 15.5 % Final   Platelets 11/24/2019 237  150 - 400 K/uL Final   nRBC 11/24/2019 0.0  0.0 - 0.2 % Final   Neutrophils Relative % 11/24/2019 56  % Final   Neutro Abs 11/24/2019 2.8  1.7 - 7.7 K/uL Final   Lymphocytes Relative 11/24/2019 28  % Final   Lymphs Abs 11/24/2019 1.4  0.7 - 4.0 K/uL Final   Monocytes Relative 11/24/2019 11  % Final   Monocytes Absolute 11/24/2019 0.5  0.1 - 1.0 K/uL Final   Eosinophils Relative 11/24/2019 4  % Final   Eosinophils Absolute 11/24/2019 0.2  0.0 - 0.5 K/uL Final   Basophils Relative 11/24/2019 1  % Final   Basophils Absolute 11/24/2019 0.1  0.0 - 0.1 K/uL Final   Immature Granulocytes 11/24/2019 0  % Final   Abs Immature Granulocytes 11/24/2019 0.01  0.00 - 0.07 K/uL Final   Performed at [facility], [address] [place], [place] [address]    Assessment:  Tylasia Fletchall is a 64 y.o. female with clinical stage T2N3bM0 right breast cancerand DCIS in the left breasts/p neoadjuvant chemotherapyfollowed by left simple mastectomy and right modified radical mastectomyon 07/10/2018. Left breast pathologyrevealed 2.8 cm residual high grade DCIS, comedo type. There was atypical lobular hyperplasia. Four sentinel lymph nodes were negative. Right breast pathologyrevealed 3.6 cm residual grade II invasive mammary carcinoma and high grade DCIS. There was lymphovascular invasion.There was metastatic carcinoma in 2 sentinel lymph nodes. Right axillary dissection revealed metastatic  carcinoma in 7 lymph nodes. There were a total of 9 lymph nodes with macrometastasis (largest 22 mm). Pathologic stagewas ypT2 ypN2a.  She underwent ultrasound guided biopsyon 10/29/2017. Pathology from the right breastat the 10 o'clock position 3 cm from the nipple revealed a 1.4 cm grade II invasive mammary carcinoma of no special type. Tumor was ER + (>90%), PR + (90%) and Her2/neu 1+. Right axillary node biopsyrevealed metastatic carcinoma. Left breast biopsyat the 2 o'clock position 3 cm from the nipple revealed high grade DCIS with comedonecrosis.   She received 4 cycles of neoadjuvantAC (12/05/2017 - 01/30/2018) with OnPro Neulasta support. She received 12 weeks ofneoadjuvant Taxol(02/13/2018 - 02/27/2018; 04/10/2018 - 06/05/2018). She received radiationfrom 09/23/2018 - 11/23/2018.She began letrozoleon02/19/2020.  Patient underwent bilateral breast reconstruction with placement of Acellular Dermal Matrix and tissue expanders on 08/30/2019.  She is having her expanders filled every 2 weeks.  CA27.29has been followed: 23.5 on 11/14/2017,<3.5 on 08/27/2018, 7.4 on 01/06/2019, 9.2 on 05/24/2019, and 11.9 on 08/23/2019.  Bone densityon 12/16/2016 revealed osteopeniawith a T-score of -1.6 in the LEFTfemoral neck on 12/16/2017. Bone densityon 11/24/2018 revealed osteopenia with a T-score of -2.1 in the LEFTfemoral neck.  She developed a right axillary abscessand underwent incision and drainage on 03/12/2018. Culture grew staph aureus. She completed Bactrim on 03/26/2018.  Symptomatically, she feels "good".  She denies any breast concerns.  She is undergoing reconstruction.  Plan: 1.   Review labs from 11/24/2019. 2.Stage IIIBRIGHTbreast cancer Clinically, she is doing well.  She is s/p bilateral mastectomy.    She has undergone bilateral breast reconstruction.   Expanders are filled every 2 weeks.  Continue  Femara. 3.Chemotherapy induced neuropathy She has  a mild neuropathy in her hands and feet. She has noticed a difference off Neurontin. B12 was normal on 05/24/2019. 4.Osteopenia Bone density on01/2020 revealed a T-score of -2.1. Continue calcium and vitamin D. Patient receives Zometa twice yearly (last 08/23/2019). Dental clearance obtained from Dr. Beatrix Shipper at [facility] [phone]). 5. Lymphedema Lymphedema has improved.    Continue to monitor.  6.     Port-A-Cath   Patient considering Port-A-Cath removal with upcoming surgery.  Port flush every 3 months (last 11/24/2019) until removal. 7.   RTC ion 02/21/2020 for MD assessment, labs (CBC with diff, CMP, CA27.29), and Zometa.  I discussed the assessment and treatment plan with the patient.  The patient was provided an opportunity to ask questions and all were answered.  The patient agreed with the plan and demonstrated an understanding of the instructions.  The patient was advised to call back if the symptoms worsen or if the condition fails to improve as anticipated.  I provided 15 minutes (11:37 AM - 11:51 AM) of face-to-face video visit time during this encounter and > 50% was spent counseling as documented under my assessment and plan.  I provided these services from the [facility] office.    Lequita Asal, MD, PhD    11/25/2019, 11:37 AM  I, Selena Batten, am acting as scribe for [employer]. Mike Gip, MD, PhD.  I, Alishea Beaudin C. Mike Gip, MD, have reviewed the above documentation for accuracy and completeness, and I agree with the above.

## 2019-11-24 ENCOUNTER — Inpatient Hospital Stay: Payer: 59 | Attending: Hematology and Oncology

## 2019-11-24 ENCOUNTER — Encounter: Payer: Self-pay | Admitting: Hematology and Oncology

## 2019-11-24 ENCOUNTER — Other Ambulatory Visit: Payer: Self-pay

## 2019-11-24 DIAGNOSIS — C50411 Malignant neoplasm of upper-outer quadrant of right female breast: Secondary | ICD-10-CM

## 2019-11-24 DIAGNOSIS — Z170 Estrogen receptor positive status [ER+]: Secondary | ICD-10-CM | POA: Diagnosis not present

## 2019-11-24 LAB — CBC WITH DIFFERENTIAL/PLATELET
Abs Immature Granulocytes: 0.01 10*3/uL (ref 0.00–0.07)
Basophils Absolute: 0.1 10*3/uL (ref 0.0–0.1)
Basophils Relative: 1 %
Eosinophils Absolute: 0.2 10*3/uL (ref 0.0–0.5)
Eosinophils Relative: 4 %
HCT: 40.4 % (ref 36.0–46.0)
Hemoglobin: 13.8 g/dL (ref 12.0–15.0)
Immature Granulocytes: 0 %
Lymphocytes Relative: 28 %
Lymphs Abs: 1.4 10*3/uL (ref 0.7–4.0)
MCH: 29.1 pg (ref 26.0–34.0)
MCHC: 34.2 g/dL (ref 30.0–36.0)
MCV: 85.2 fL (ref 80.0–100.0)
Monocytes Absolute: 0.5 10*3/uL (ref 0.1–1.0)
Monocytes Relative: 11 %
Neutro Abs: 2.8 10*3/uL (ref 1.7–7.7)
Neutrophils Relative %: 56 %
Platelets: 237 10*3/uL (ref 150–400)
RBC: 4.74 MIL/uL (ref 3.87–5.11)
RDW: 12.4 % (ref 11.5–15.5)
WBC: 5 10*3/uL (ref 4.0–10.5)
nRBC: 0 % (ref 0.0–0.2)

## 2019-11-24 LAB — COMPREHENSIVE METABOLIC PANEL
ALT: 20 U/L (ref 0–44)
AST: 26 U/L (ref 15–41)
Albumin: 4.1 g/dL (ref 3.5–5.0)
Alkaline Phosphatase: 60 U/L (ref 38–126)
Anion gap: 7 (ref 5–15)
BUN: 15 mg/dL (ref 8–23)
CO2: 24 mmol/L (ref 22–32)
Calcium: 9.1 mg/dL (ref 8.9–10.3)
Chloride: 102 mmol/L (ref 98–111)
Creatinine, Ser: 0.88 mg/dL (ref 0.44–1.00)
GFR calc Af Amer: >60 mL/min (ref >60)
GFR calc non Af Amer: >60 mL/min (ref >60)
Glucose, Bld: 116 mg/dL — ABNORMAL HIGH (ref 70–99)
Potassium: 4 mmol/L (ref 3.5–5.1)
Sodium: 133 mmol/L — ABNORMAL LOW (ref 135–145)
Total Bilirubin: 1.2 mg/dL (ref 0.3–1.2)
Total Protein: 7.2 g/dL (ref 6.5–8.1)

## 2019-11-24 MED ORDER — HEPARIN SOD (PORK) LOCK FLUSH 100 UNIT/ML IV SOLN
500.0000 [IU] | Freq: Once | INTRAVENOUS | Status: AC
  Administered 2019-11-24: 14:00:00 500 [IU] via INTRAVENOUS
  Filled 2019-11-24: qty 5

## 2019-11-24 MED ORDER — SODIUM CHLORIDE 0.9% FLUSH
10.0000 mL | INTRAVENOUS | Status: DC | PRN
  Administered 2019-11-24: 14:00:00 10 mL via INTRAVENOUS
  Filled 2019-11-24: qty 10

## 2019-11-24 NOTE — Progress Notes (Signed)
Cant hear out of right ear. Wants to know if this is a symptom of cancer treatment.  She is going to be getting breast implants. Patient states that she okay being called at any time today for her virtual visit.

## 2019-11-25 ENCOUNTER — Encounter: Payer: Self-pay | Admitting: Hematology and Oncology

## 2019-11-25 ENCOUNTER — Inpatient Hospital Stay (HOSPITAL_BASED_OUTPATIENT_CLINIC_OR_DEPARTMENT_OTHER): Payer: 59 | Admitting: Hematology and Oncology

## 2019-11-25 ENCOUNTER — Other Ambulatory Visit: Payer: 59

## 2019-11-25 DIAGNOSIS — T451X5A Adverse effect of antineoplastic and immunosuppressive drugs, initial encounter: Secondary | ICD-10-CM

## 2019-11-25 DIAGNOSIS — D0512 Intraductal carcinoma in situ of left breast: Secondary | ICD-10-CM | POA: Diagnosis not present

## 2019-11-25 DIAGNOSIS — C50411 Malignant neoplasm of upper-outer quadrant of right female breast: Secondary | ICD-10-CM | POA: Diagnosis not present

## 2019-11-25 DIAGNOSIS — M85852 Other specified disorders of bone density and structure, left thigh: Secondary | ICD-10-CM | POA: Diagnosis not present

## 2019-11-25 DIAGNOSIS — G620 Drug-induced polyneuropathy: Secondary | ICD-10-CM | POA: Diagnosis not present

## 2019-11-25 DIAGNOSIS — I890 Lymphedema, not elsewhere classified: Secondary | ICD-10-CM

## 2019-11-29 LAB — CANCER ANTIGEN 27.29: CA 27.29: 8.4 U/mL (ref 0.0–38.6)

## 2019-11-30 ENCOUNTER — Other Ambulatory Visit: Payer: Self-pay

## 2019-11-30 ENCOUNTER — Encounter: Payer: Self-pay | Admitting: Plastic Surgery

## 2019-11-30 ENCOUNTER — Ambulatory Visit (INDEPENDENT_AMBULATORY_CARE_PROVIDER_SITE_OTHER): Payer: 59 | Admitting: Plastic Surgery

## 2019-11-30 VITALS — BP 120/75 | HR 78 | Temp 98.2°F | Ht 65.0 in | Wt 155.0 lb

## 2019-11-30 DIAGNOSIS — C50411 Malignant neoplasm of upper-outer quadrant of right female breast: Secondary | ICD-10-CM

## 2019-11-30 DIAGNOSIS — D0512 Intraductal carcinoma in situ of left breast: Secondary | ICD-10-CM

## 2019-11-30 NOTE — Progress Notes (Signed)
   Subjective:    Patient ID: Megan Vega, female    DOB: 1956/03/13, 64 y.o.   MRN: [id number]  The patient is a 64 year old white female here for follow-up after undergoing breast reconstruction.  She had delayed reconstruction with breast expanders and Flex HD placed.  The bilateral mastectomies was for treatment of right breast cancer.  She also had right sided radiation.  Her expansion has been slow but she has been tolerating it well.  Her incisions are healing nicely and so far no sign of infection.  The right breast is still very tight.  The incision is healed well and there is no sign of infection just has very tight skin.  The left side is expanding nicely.     Review of Systems  Constitutional: Negative.   HENT: Negative.   Eyes: Negative.   Respiratory: Negative.   Cardiovascular: Negative.   Endocrine: Negative.   Genitourinary: Negative.   Musculoskeletal: Negative.   Hematological: Negative.        Objective:   Physical Exam Vitals and nursing note reviewed.  Constitutional:      Appearance: Normal appearance.  Cardiovascular:     Rate and Rhythm: Normal rate.     Pulses: Normal pulses.  Pulmonary:     Effort: Pulmonary effort is normal.  Abdominal:     General: Abdomen is flat.  Neurological:     General: No focal deficit present.     Mental Status: She is alert and oriented to person, place, and time.  Psychiatric:        Mood and Affect: Mood normal.        Behavior: Behavior normal.        Thought Content: Thought content normal.        Assessment & Plan:     ICD-10-CM   1. Primary cancer of upper outer quadrant of right breast ([place])  C50.411   2. Ductal carcinoma in situ (DCIS) of left breast  D05.12      We placed injectable saline in the Expander using a sterile technique: Right: none cc for a total of 180 / 375 cc Left: 50 cc for a total of 310 / 375 cc  We will most likely need to do a latissimus muscle flap on the right side.  We  will continue with trying to expand the left. Follow-up in 2 weeks.  The [place] was signed into law in 2016 which includes the topic of electronic health records.  This provides immediate access to information in MyChart.  This includes consultation notes, operative notes, office notes, lab results and pathology reports.  If you have any questions about what you read please let us know at your next visit or call us at the office.  We are right here with you.

## 2019-12-17 ENCOUNTER — Ambulatory Visit
Admission: RE | Admit: 2019-12-17 | Discharge: 2019-12-17 | Disposition: A | Discharge status: Home / Self Care | Payer: 59 | Source: Ambulatory Visit | Attending: Radiation Oncology | Admitting: Radiation Oncology

## 2019-12-17 DIAGNOSIS — Z170 Estrogen receptor positive status [ER+]: Secondary | ICD-10-CM

## 2019-12-17 DIAGNOSIS — C50411 Malignant neoplasm of upper-outer quadrant of right female breast: Secondary | ICD-10-CM

## 2019-12-17 NOTE — Progress Notes (Signed)
Radiation Oncology Follow up Note  Name: Megan Vega   Date:   12/17/2019 MRN:  [id number] DOB: Jun 18, 1956   Radiation Oncology TeleHEALTH VISIT PROGRESS NOTE  I connected with            Megan Vega      by telephone-[place] and verified that I am speaking with the correct person using two identifiers.  I discussed the limitations, risks, security and privacy concerns of performing an evaluation and management service by telemedicine and the availability of in-person appointments. I also discussed with the patient that there may be a patient responsible charge related to this service. The patient expressed understanding and agreed to proceed.    Other persons participating in the visit and their role in the encounter:    Patient's location: Home   Provider's location: work   This 64 y.o. female presents by telephone visit for 1 year follow-up status post radiation therapy to her right chest wall and peripheral lymphatics for stage IIIb invasive mammary carcinoma ER/PR positive status post bilateral mastectomies  REFERRING PROVIDER: Burnard Hawthorne, FNP  HPI: Patient is a 64 year old female now out 1 year having completed right chest wall peripheral emphatic radiation for stage IIIb invasive mammary carcinoma ER/PR positive.  She is contacted by telephone she is doing well she is in the process of being undergoing reconstructive surgery bilaterally she has tissue expanders in both sides.  Right chest wall is somewhat more difficult and she might wind up with a latissimus flap.  She specifically denies any chest wall tenderness cough or bone pain..  She is currently on Femara tolerating that well without side effect.  COMPLICATIONS OF TREATMENT: none  FOLLOW UP COMPLIANCE: keeps appointments   PHYSICAL EXAM:  There were no vitals taken for this visit. No exam was performed secondary to this being a telephone visit  RADIOLOGY RESULTS: No current films to review  PLAN: Present time  patient is doing well in the midst of ongoing bilateral breast reconstruction.  I am pleased with her overall progress.  I am going to turn follow-up care over to Dr. Mike Gip.  Patient knows to call with anytime with any concerns.  She continues on Femara without side effect.  I would like to take this opportunity to thank you for allowing me to participate in the care of your patient.Noreene Filbert, MD

## 2019-12-21 ENCOUNTER — Ambulatory Visit (INDEPENDENT_AMBULATORY_CARE_PROVIDER_SITE_OTHER): Payer: 59 | Admitting: Plastic Surgery

## 2019-12-21 ENCOUNTER — Encounter: Payer: Self-pay | Admitting: Plastic Surgery

## 2019-12-21 ENCOUNTER — Other Ambulatory Visit: Payer: Self-pay

## 2019-12-21 DIAGNOSIS — Z9013 Acquired absence of bilateral breasts and nipples: Secondary | ICD-10-CM

## 2019-12-21 NOTE — Progress Notes (Signed)
   Subjective:     Patient ID: Megan Vega, female    DOB: 06/14/1956, 64 y.o.   MRN: [id number]  Chief Complaint  [name]    follow up from BL breast reconstruction    HPI: The patient is a 64 y.o. female here for follow-up After undergoing breast reconstruction on 08/30/2019.  She had delayed reconstruction with breast expanders and Flex HD placed.  She had right sided breast cancer and radiation.  Bilateral mastectomies were for the treatment of this.  Bilateral incisions are healing very well, no signs of infection.  Right breast is still very tight.  Patient reports no issues with last fill on the left side.  No fill was done previously on the right side due to tightness.   Review of Systems  Constitutional: Negative for chills and fever.  Respiratory: Negative for shortness of breath.   Cardiovascular: Negative for chest pain and leg swelling.  Gastrointestinal: Negative for constipation, diarrhea, nausea and vomiting.  Skin: Negative for color change, pallor and rash.     Objective:   Vital Signs BP 138/90 (BP Location: Left Arm, Patient Position: Sitting, Cuff Size: Normal)   Pulse 75   Temp (!) 96.4 F (35.8 C) (Temporal)   Ht 5\' 5"  (1.651 m)   Wt 161 lb 3.2 oz (73.1 kg)   SpO2 100%   BMI 26.83 kg/m  Vital Signs and Nursing Note Reviewed  Physical Exam  Constitutional: She is oriented to person, place, and time and well-developed, well-nourished, and in no distress.  HENT:  Head: Normocephalic and atraumatic.  Eyes: EOM are normal.  Cardiovascular: Normal rate.  Pulmonary/Chest: Effort normal.    Incisions healing very well, c/d/i. Right breast is tight. No signs of infection, seroma/hematoma, drainage, or redness.  Musculoskeletal:        General: Normal range of motion.     Cervical back: Normal range of motion.  Neurological: She is alert and oriented to person, place, and time. Gait normal.  Skin: Skin is warm and dry. No  rash noted. No erythema. No pallor.  Psychiatric: Mood, memory, affect and judgment normal.      Assessment/Plan:     ICD-10-CM   1. S/P mastectomy, bilateral  Z90.13     Overall Megan Vega is doing very well today.  Incisions are healing very well, c/d/i. Right side is very tight. Left side is doing well. Patient reports no issues with the last fill on the left.  She and Dr. Marla Roe had discussed possible latissimus flap on the right side due to radiation.  Patient expresses desire to be a little larger if she can, so would like a fill today.  We placed injectable saline in the expander using a sterile technique: Right: None CC for a total of 180 / 375 cc. Left: 50 cc for a total of 360 / 375 cc.  Follow up in 2 weeks with Dr. Marla Roe. Call office with any questions/concerns.  The [place] was signed into law in 2016 which includes the topic of electronic health records.  This provides immediate access to information in MyChart.  This includes consultation notes, operative notes, office notes, lab results and pathology reports.  If you have any questions about what you read please let us know at your next visit or call us at the office.  We are right here with you.     Threasa Heads, PA-C 12/21/2019, 4:19 PM

## 2020-01-06 ENCOUNTER — Telehealth: Payer: Self-pay | Admitting: Plastic Surgery

## 2020-01-06 NOTE — Telephone Encounter (Signed)

## 2020-01-07 ENCOUNTER — Other Ambulatory Visit: Payer: Self-pay | Admitting: Oncology

## 2020-01-07 ENCOUNTER — Encounter: Payer: Self-pay | Admitting: Plastic Surgery

## 2020-01-07 ENCOUNTER — Ambulatory Visit (INDEPENDENT_AMBULATORY_CARE_PROVIDER_SITE_OTHER): Payer: 59 | Admitting: Plastic Surgery

## 2020-01-07 ENCOUNTER — Other Ambulatory Visit: Payer: Self-pay

## 2020-01-07 ENCOUNTER — Telehealth: Payer: Self-pay | Admitting: *Deleted

## 2020-01-07 VITALS — BP 116/79 | HR 71 | Temp 97.9°F | Ht 65.0 in | Wt 160.0 lb

## 2020-01-07 DIAGNOSIS — Z9013 Acquired absence of bilateral breasts and nipples: Secondary | ICD-10-CM

## 2020-01-07 DIAGNOSIS — G620 Drug-induced polyneuropathy: Secondary | ICD-10-CM

## 2020-01-07 DIAGNOSIS — Z9010 Acquired absence of unspecified breast and nipple: Secondary | ICD-10-CM | POA: Insufficient documentation

## 2020-01-07 DIAGNOSIS — T451X5A Adverse effect of antineoplastic and immunosuppressive drugs, initial encounter: Secondary | ICD-10-CM

## 2020-01-07 NOTE — Progress Notes (Signed)
   Subjective:    Patient ID: Megan Vega, female    DOB: September 21, 1956, 64 y.o.   MRN: [id number]  The patient is a 63 year old female here for follow-up on her bilateral breast reconstruction.  She had bilateral mastectomies in August 2019 and radiation which ended in January 2020 on the right breast.  Preoperatively her bra size was a 34C.  She is 5 feet 5 inches tall and weighs 156 pounds.  She has expanders in place.  There is 180 cc in the right expander and 360 cc in the left expander.  The right skin and soft tissue is extremely tight.  We knew that this was a very strong possibility as an outcome due to the previous radiation.  All incisions are healing well and there is no sign of infection.  The left side is a little bit tight but I think has room for expansion.   Review of Systems  Constitutional: Negative.   HENT: Negative.   Eyes: Negative.   Respiratory: Negative.   Cardiovascular: Negative.   Musculoskeletal: Negative.        Objective:   Physical Exam Vitals and nursing note reviewed.  Constitutional:      Appearance: Normal appearance.  HENT:     Head: Normocephalic and atraumatic.  Cardiovascular:     Rate and Rhythm: Normal rate.     Pulses: Normal pulses.  Neurological:     General: No focal deficit present.     Mental Status: She is alert and oriented to person, place, and time.  Psychiatric:        Mood and Affect: Mood normal.        Behavior: Behavior normal.       Assessment & Plan:     ICD-10-CM   1. S/P mastectomy, bilateral  Z90.13   2. Acquired absence of both breasts  Z90.13     The patient is not good to be able to get more expansion in the right breast.  In order to make her more symmetric we will need to do the latissimus flap.  We have talked about this in the past and the patient would like to proceed with this option.  I think this is reasonable and will request the following:  Right latissimus myocutaneous flap reconstruction for right  breast.  This will still be a staged procedure with continued expansion and then switching to the implant.  Pictures were obtained of the patient and placed in the chart with the patient's or guardian's permission.

## 2020-01-07 NOTE — Telephone Encounter (Signed)
T/C made to patient Megan Vega for purpose of scheduling her 22 month study visit for the [facility] [phone] "[name]" study. Patient already has appointment with her surgeon on 01/26/2020 at 3:30 and will plan to come over to the [place] following that appointment . Reminded patient to being any leftover study drug she has as well as her med diaries for the past 6 months and we will exchange all of this at that time. Ms. Kohlman repeated back these instructions and agreed to the date/time of the study visit. [name], BSN, MHA, OCN 01/07/2020 11:08 AM

## 2020-01-25 NOTE — H&P (View-Only) (Signed)
Patient ID: Megan Vega, female    DOB: [birth year], 64 y.o.   MRN: [id number]  Chief Complaint  Patient presents with  . Pre-op Exam    H&P SX: 02/07/20  breast recontruction with latissimus myocutaneos flap      ICD-10-CM   1. Ductal carcinoma in situ (DCIS) of left breast  D05.12   2. Primary cancer of upper outer quadrant of right breast ([place])  C50.411   3. S/P mastectomy, bilateral  Z90.13     History of Present Illness: Megan Vega is a 64 y.o.  female  with a history of right breast invasive mammary carcinoma that was ER/PR positive and HER-2/neu positive.  She also has a history of left breast DCIS.  She underwent chemotherapy as well as right-sided radiation.  Radiation ended January 2020.  She currently has expanders in place, expansion on the right have been delayed due to skin tightness after radiation. She has opted to undergo latissimus flap reconstruction to the right due to the inability to expand further due to radiation. She presents for preoperative evaluation for upcoming procedure, right breast reconstruction with latissimus myocutaneous flap, scheduled for 02/07/2020 with Dr. Marla Roe.  The patient has not had problems with anesthesia. No hx or fmhx of dvt/pe. No hx of mi/cva. No recent illness. Not taking any blood thinners. No hx or fmhx of bleeding/clotting disorders.  Former smoker, quit greater than 35 years ago.  She had some questions about removing her left chest port-a-cath. She reports oncology, Dr. Mike Gip is okay with removal of port-a-cath at this time. Per EMR review, "Patient considering port-a-cath removal with upcoming surgery"  Past Medical History: Allergies: Allergies  Allergen Reactions  . Peanut-Containing Drug Products Swelling and Other (See Comments)    Only when eating too many peanuts, swelling with lips and tingly face    Current Medications:  Current Outpatient Medications:  .  acetaminophen (TYLENOL) 500 MG tablet,  Take 1,000 mg by mouth every 6 (six) hours as needed for moderate pain or headache., Disp: , Rfl:  .  b complex vitamins tablet, Take 1 tablet by mouth daily., Disp: , Rfl:  .  CALCIUM PO, Take 1,200 mg by mouth daily. 1200 MG Tablets, Disp: , Rfl:  .  gabapentin (NEURONTIN) 100 MG capsule, TAKE 2 CAPSULES AT BEDTIME, Disp: 120 capsule, Rfl: 2 .  letrozole (FEMARA) 2.5 MG tablet, TAKE 1 TABLET BY MOUTH EVERY DAY, Disp: 90 tablet, Rfl: 0 .  loratadine (CLARITIN) 10 MG tablet, Take 10 mg by mouth every morning. , Disp: , Rfl:  .  Melatonin 3 MG CAPS, Take 3 mg by mouth at bedtime as needed (sleep.). , Disp: , Rfl:  .  Multiple Vitamin (MULTIVITAMIN WITH MINERALS) TABS tablet, Take 1 tablet by mouth daily. Centrum Silver, Disp: , Rfl:  .  mupirocin ointment (BACTROBAN) 2 %, Apply 1 application topically 2 (two) times daily. X 5 days to nose and bid x 7-14 days right leg, Disp: 30 g, Rfl: 1 .  polyethylene glycol (MIRALAX / GLYCOLAX) packet, Take 17 g by mouth daily as needed (for constipation.). , Disp: , Rfl:  .  Probiotic Product (PROBIOTIC PO), Take 1 capsule by mouth daily., Disp: , Rfl:  .  tetrahydrozoline 0.05 % ophthalmic solution, Place 1 drop into both eyes 3 (three) times daily as needed (for dry/red eyes.)., Disp: , Rfl:  No current facility-administered medications for this visit.  Facility-Administered Medications Ordered in Other Visits:  .  heparin lock  flush 100 unit/mL, 500 Units, Intracatheter, PRN, Lequita Asal, MD  Past Medical Problems: Past Medical History:  Diagnosis Date  . Ankle fracture   . Cancer [facility])    Basal Cell Carcinoma, about 2011 chest  . Cataract   . Family history of adverse reaction to anesthesia    DAUGHTER-N/V  . Hemorrhoids   . History of methicillin resistant staphylococcus aureus (MRSA) 2015  . Migraines    MIGRAINES occ  . Personal history of chemotherapy   . Personal history of radiation therapy     Past Surgical History: Past  Surgical History:  Procedure Laterality Date  . BREAST BIOPSY Bilateral 10/29/2017   L breast DCIS, R breast invasive mammary carcinoma of no special type, ER/PR+, Her2/neu 1+  . BREAST RECONSTRUCTION WITH PLACEMENT OF TISSUE EXPANDER AND FLEX HD (ACELLULAR HYDRATED DERMIS) Bilateral 08/30/2019   Procedure: BREAST RECONSTRUCTION WITH PLACEMENT OF TISSUE EXPANDER AND FLEX HD (ACELLULAR HYDRATED DERMIS);  Surgeon: Wallace Going, DO;  Location: [facility] ORS;  Service: Plastics;  Laterality: Bilateral;  2.5 hours, please  . CESAREAN SECTION    . COLONOSCOPY  2012  . COLONOSCOPY WITH PROPOFOL N/A 12/01/2018   Procedure: COLONOSCOPY WITH PROPOFOL;  Surgeon: Lucilla Lame, MD;  Location: [facility] ENDOSCOPY;  Service: Endoscopy;  Laterality: N/A;  . FRACTURE SURGERY     ankle  . IRRIGATION AND DEBRIDEMENT ABSCESS Right 03/12/2018   Procedure: IRRIGATION AND DEBRIDEMENT RIGHT AXILLARY ABSCESS;  Surgeon: Robert Bellow, MD;  Location: [facility] ORS;  Service: General;  Laterality: Right;  . MANDIBLE FRACTURE SURGERY    . MASTECTOMY    . MASTECTOMY MODIFIED RADICAL Right 07/10/2018   ypT2 ypN2a ER/PR positive, HER-2/neu negative  Surgeon: Robert Bellow, MD;  Location: [facility] ORS;  Service: General;  Laterality: Right;  . PORTACATH PLACEMENT Left 11/28/2017   Procedure: INSERTION PORT-A-CATH;  Surgeon: Robert Bellow, MD;  Location: [facility] ORS;  Service: General;  Laterality: Left;  . SIMPLE MASTECTOMY WITH AXILLARY SENTINEL NODE BIOPSY Left 07/10/2018   High-grade DCIS SIMPLE MASTECTOMY;  Surgeon: Robert Bellow, MD;  Location: [facility] ORS;  Service: General;  Laterality: Left;    Social History: Social History   Socioeconomic History  . Marital status: Divorced    Spouse name: Not on file  . Number of children: Not on file  . Years of education: Not on file  . Highest education level: Not on file  Occupational History  . Not on file  Tobacco Use  . Smoking status: Former Smoker    Packs/day:  0.25    Years: 10.00    Pack years: 2.50    Quit date: 1978    Years since quitting: 43.2  . Smokeless tobacco: Never Used  Substance and Sexual Activity  . Alcohol use: Yes    Alcohol/week: 1.0 standard drinks    Types: 1 Glasses of wine per week    Comment: RARE  . Drug use: No  . Sexual activity: Not Currently  Other Topics Concern  . 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    Lives at home (son 38 yo lives with her)   Children 2   Pets: 3 small dogs and 1 frog    Caffeine- 1 cup of coffee in the morning, rare tea   Social Determinants of Health   Financial Resource Strain:   . Difficulty of Paying Living Expenses: Not on file  Food Insecurity:   . Worried  About Running Out of Food in the Last Year: Not on file  . Ran Out of Food in the Last Year: Not on file  Transportation Needs:   . Lack of Transportation (Medical): Not on file  . Lack of Transportation (Non-Medical): Not on file  Physical Activity:   . Days of Exercise per Week: Not on file  . Minutes of Exercise per Session: Not on file  Stress:   . Feeling of Stress : Not on file  Social Connections:   . Frequency of Communication with Friends and Family: Not on file  . Frequency of Social Gatherings with Friends and Family: Not on file  . Attends Religious Services: Not on file  . Active Member of Clubs or Organizations: Not on file  . Attends Archivist Meetings: Not on file  . Marital Status: Not on file  Intimate Partner Violence:   . Fear of Current or Ex-Partner: Not on file  . Emotionally Abused: Not on file  . Physically Abused: Not on file  . Sexually Abused: Not on file    Family History: Family History  Problem Relation Age of Onset  . Cancer Mother        Esophageal Cancer  . Early death Mother        Age 67  . Cancer Father        Lung Cancer  . Diabetes Father   . Diabetes Brother        Controlled with diet  . Heart attack Paternal  Grandfather   . Colon cancer Neg Hx     Review of Systems: Review of Systems  Constitutional: Negative for chills and fever.  Respiratory: Negative for shortness of breath.   Cardiovascular: Negative for chest pain.  Gastrointestinal: Negative for nausea and vomiting.    Physical Exam: Vital Signs BP 122/84 (BP Location: Left Arm, Patient Position: Sitting, Cuff Size: Normal)   Pulse 67   Temp (!) 97.5 F (36.4 C) (Temporal)   Ht _0  (1.651 m)   Wt 157 lb 3.2 oz (71.3 kg)   SpO2 98%   BMI 26.16 kg/m   Physical Exam Constitutional:      General: She is not in acute distress.    Appearance: Normal appearance. She is not ill-appearing.  Cardiovascular:     Rate and Rhythm: Normal rate and regular rhythm.     Pulses: Normal pulses.  Pulmonary:     Effort: Pulmonary effort is normal.     Breath sounds: Normal breath sounds.  Chest:    Abdominal:     General: Abdomen is flat. Bowel sounds are normal.  Musculoskeletal:        General: No swelling. Normal range of motion.     Cervical back: No tenderness.     Right lower leg: No edema.     Left lower leg: No edema.  Lymphadenopathy:     Cervical: No cervical adenopathy.  Skin:    General: Skin is warm and dry.  Neurological:     General: No focal deficit present.     Mental Status: She is alert and oriented to person, place, and time.  Psychiatric:        Mood and Affect: Mood normal.        Behavior: Behavior normal.      Assessment/Plan: The patient is scheduled for right breast reconstruction with latissimus myocutaneous flap with Dr. Marla Roe on 02/07/2020.    Patient had questions about removal of port-a-cath during this  operation on 3/22. I will consult with team to determine if that is something we can coordinate this surgery or wait until expander to implant exchange.  Patient provided full consent form for breast reconstruction, general surgical risks.  Patient was provided time prior to appointment  to read through entire consent form and initial & that she understands all the risks associated with upcoming surgery.  We also discussed the risks during her preop appointment.  She had plenty of time to ask any questions, recommend calling if she has any further questions prior to surgery.   Prescription sent to pharmacy. Covid test scheduled.  Mammogram: bilateral mastectomy Smoking status: quit > 40 years ago Caprini score: 6, high, recommend SCDs, early ambulation, possible chemoppx. BMI>25, currently on hormone therapy, surgery > 45 min, age >61-74.   The risks that can be encountered with and after placement of a breast expander placement were discussed and include the following but not limited to these: bleeding, infection, delayed healing, anesthesia risks, skin sensation changes, injury to structures including nerves, blood vessels, and muscles which may be temporary or permanent, allergies to tape, suture materials and glues, blood products, topical preparations or injected agents, skin contour irregularities, skin discoloration and swelling, deep vein thrombosis, cardiac and pulmonary complications, pain, which may persist, fluid accumulation, wrinkling of the skin over the expander, changes in nipple or breast sensation, expander leakage or rupture, faulty position of the expander, persistent pain, formation of tight scar tissue around the expander (capsular contracture), possible need for revisional surgery or staged procedures.   Electronically signed by: Carola Rhine Harryette Shuart, PA-C 01/26/2020 9:32 AM

## 2020-01-25 NOTE — Progress Notes (Signed)
Patient ID: Megan Vega, female    DOB: [birth year], 64 y.o.   MRN: [id number]  Chief Complaint  Patient presents with  . Pre-op Exam    H&P SX: 02/07/20  breast recontruction with latissimus myocutaneos flap      ICD-10-CM   1. Ductal carcinoma in situ (DCIS) of left breast  D05.12   2. Primary cancer of upper outer quadrant of right breast ([place])  C50.411   3. S/P mastectomy, bilateral  Z90.13     History of Present Illness: Megan Vega is a 64 y.o.  female  with a history of right breast invasive mammary carcinoma that was ER/PR positive and HER-2/neu positive.  She also has a history of left breast DCIS.  She underwent chemotherapy as well as right-sided radiation.  Radiation ended January 2020.  She currently has expanders in place, expansion on the right have been delayed due to skin tightness after radiation. She has opted to undergo latissimus flap reconstruction to the right due to the inability to expand further due to radiation. She presents for preoperative evaluation for upcoming procedure, right breast reconstruction with latissimus myocutaneous flap, scheduled for 02/07/2020 with Dr. Marla Roe.  The patient has not had problems with anesthesia. No hx or fmhx of dvt/pe. No hx of mi/cva. No recent illness. Not taking any blood thinners. No hx or fmhx of bleeding/clotting disorders.  Former smoker, quit greater than 35 years ago.  She had some questions about removing her left chest port-a-cath. She reports oncology, Dr. Mike Gip is okay with removal of port-a-cath at this time. Per EMR review, "Patient considering port-a-cath removal with upcoming surgery"  Past Medical History: Allergies: Allergies  Allergen Reactions  . Peanut-Containing Drug Products Swelling and Other (See Comments)    Only when eating too many peanuts, swelling with lips and tingly face    Current Medications:  Current Outpatient Medications:  .  acetaminophen (TYLENOL) 500 MG tablet,  Take 1,000 mg by mouth every 6 (six) hours as needed for moderate pain or headache., Disp: , Rfl:  .  b complex vitamins tablet, Take 1 tablet by mouth daily., Disp: , Rfl:  .  CALCIUM PO, Take 1,200 mg by mouth daily. 1200 MG Tablets, Disp: , Rfl:  .  gabapentin (NEURONTIN) 100 MG capsule, TAKE 2 CAPSULES AT BEDTIME, Disp: 120 capsule, Rfl: 2 .  letrozole (FEMARA) 2.5 MG tablet, TAKE 1 TABLET BY MOUTH EVERY DAY, Disp: 90 tablet, Rfl: 0 .  loratadine (CLARITIN) 10 MG tablet, Take 10 mg by mouth every morning. , Disp: , Rfl:  .  Melatonin 3 MG CAPS, Take 3 mg by mouth at bedtime as needed (sleep.). , Disp: , Rfl:  .  Multiple Vitamin (MULTIVITAMIN WITH MINERALS) TABS tablet, Take 1 tablet by mouth daily. Centrum Silver, Disp: , Rfl:  .  mupirocin ointment (BACTROBAN) 2 %, Apply 1 application topically 2 (two) times daily. X 5 days to nose and bid x 7-14 days right leg, Disp: 30 g, Rfl: 1 .  polyethylene glycol (MIRALAX / GLYCOLAX) packet, Take 17 g by mouth daily as needed (for constipation.). , Disp: , Rfl:  .  Probiotic Product (PROBIOTIC PO), Take 1 capsule by mouth daily., Disp: , Rfl:  .  tetrahydrozoline 0.05 % ophthalmic solution, Place 1 drop into both eyes 3 (three) times daily as needed (for dry/red eyes.)., Disp: , Rfl:  No current facility-administered medications for this visit.  Facility-Administered Medications Ordered in Other Visits:  .  heparin lock  flush 100 unit/mL, 500 Units, Intracatheter, PRN, Lequita Asal, MD  Past Medical Problems: Past Medical History:  Diagnosis Date  . Ankle fracture   . Cancer [facility])    Basal Cell Carcinoma, about 2011 chest  . Cataract   . Family history of adverse reaction to anesthesia    DAUGHTER-N/V  . Hemorrhoids   . History of methicillin resistant staphylococcus aureus (MRSA) 2015  . Migraines    MIGRAINES occ  . Personal history of chemotherapy   . Personal history of radiation therapy     Past Surgical History: Past  Surgical History:  Procedure Laterality Date  . BREAST BIOPSY Bilateral 10/29/2017   L breast DCIS, R breast invasive mammary carcinoma of no special type, ER/PR+, Her2/neu 1+  . BREAST RECONSTRUCTION WITH PLACEMENT OF TISSUE EXPANDER AND FLEX HD (ACELLULAR HYDRATED DERMIS) Bilateral 08/30/2019   Procedure: BREAST RECONSTRUCTION WITH PLACEMENT OF TISSUE EXPANDER AND FLEX HD (ACELLULAR HYDRATED DERMIS);  Surgeon: Wallace Going, DO;  Location: [facility] ORS;  Service: Plastics;  Laterality: Bilateral;  2.5 hours, please  . CESAREAN SECTION    . COLONOSCOPY  2012  . COLONOSCOPY WITH PROPOFOL N/A 12/01/2018   Procedure: COLONOSCOPY WITH PROPOFOL;  Surgeon: Lucilla Lame, MD;  Location: [facility] ENDOSCOPY;  Service: Endoscopy;  Laterality: N/A;  . FRACTURE SURGERY     ankle  . IRRIGATION AND DEBRIDEMENT ABSCESS Right 03/12/2018   Procedure: IRRIGATION AND DEBRIDEMENT RIGHT AXILLARY ABSCESS;  Surgeon: Robert Bellow, MD;  Location: [facility] ORS;  Service: General;  Laterality: Right;  . MANDIBLE FRACTURE SURGERY    . MASTECTOMY    . MASTECTOMY MODIFIED RADICAL Right 07/10/2018   ypT2 ypN2a ER/PR positive, HER-2/neu negative  Surgeon: Robert Bellow, MD;  Location: [facility] ORS;  Service: General;  Laterality: Right;  . PORTACATH PLACEMENT Left 11/28/2017   Procedure: INSERTION PORT-A-CATH;  Surgeon: Robert Bellow, MD;  Location: [facility] ORS;  Service: General;  Laterality: Left;  . SIMPLE MASTECTOMY WITH AXILLARY SENTINEL NODE BIOPSY Left 07/10/2018   High-grade DCIS SIMPLE MASTECTOMY;  Surgeon: Robert Bellow, MD;  Location: [facility] ORS;  Service: General;  Laterality: Left;    Social History: Social History   Socioeconomic History  . Marital status: Divorced    Spouse name: Not on file  . Number of children: Not on file  . Years of education: Not on file  . Highest education level: Not on file  Occupational History  . Not on file  Tobacco Use  . Smoking status: Former Smoker    Packs/day:  0.25    Years: 10.00    Pack years: 2.50    Quit date: 1978    Years since quitting: 43.2  . Smokeless tobacco: Never Used  Substance and Sexual Activity  . Alcohol use: Yes    Alcohol/week: 1.0 standard drinks    Types: 1 Glasses of wine per week    Comment: RARE  . Drug use: No  . Sexual activity: Not Currently  Other Topics Concern  . 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    Lives at home (son 19 yo lives with her)   Children 2   Pets: 3 small dogs and 1 frog    Caffeine- 1 cup of coffee in the morning, rare tea   Social Determinants of Health   Financial Resource Strain:   . Difficulty of Paying Living Expenses: Not on file  Food Insecurity:   . Worried  About Running Out of Food in the Last Year: Not on file  . Ran Out of Food in the Last Year: Not on file  Transportation Needs:   . Lack of Transportation (Medical): Not on file  . Lack of Transportation (Non-Medical): Not on file  Physical Activity:   . Days of Exercise per Week: Not on file  . Minutes of Exercise per Session: Not on file  Stress:   . Feeling of Stress : Not on file  Social Connections:   . Frequency of Communication with Friends and Family: Not on file  . Frequency of Social Gatherings with Friends and Family: Not on file  . Attends Religious Services: Not on file  . Active Member of Clubs or Organizations: Not on file  . Attends Archivist Meetings: Not on file  . Marital Status: Not on file  Intimate Partner Violence:   . Fear of Current or Ex-Partner: Not on file  . Emotionally Abused: Not on file  . Physically Abused: Not on file  . Sexually Abused: Not on file    Family History: Family History  Problem Relation Age of Onset  . Cancer Mother        Esophageal Cancer  . Early death Mother        Age 67  . Cancer Father        Lung Cancer  . Diabetes Father   . Diabetes Brother        Controlled with diet  . Heart attack Paternal  Grandfather   . Colon cancer Neg Hx     Review of Systems: Review of Systems  Constitutional: Negative for chills and fever.  Respiratory: Negative for shortness of breath.   Cardiovascular: Negative for chest pain.  Gastrointestinal: Negative for nausea and vomiting.    Physical Exam: Vital Signs BP 122/84 (BP Location: Left Arm, Patient Position: Sitting, Cuff Size: Normal)   Pulse 67   Temp (!) 97.5 F (36.4 C) (Temporal)   Ht _0  (1.651 m)   Wt 157 lb 3.2 oz (71.3 kg)   SpO2 98%   BMI 26.16 kg/m   Physical Exam Constitutional:      General: She is not in acute distress.    Appearance: Normal appearance. She is not ill-appearing.  Cardiovascular:     Rate and Rhythm: Normal rate and regular rhythm.     Pulses: Normal pulses.  Pulmonary:     Effort: Pulmonary effort is normal.     Breath sounds: Normal breath sounds.  Chest:    Abdominal:     General: Abdomen is flat. Bowel sounds are normal.  Musculoskeletal:        General: No swelling. Normal range of motion.     Cervical back: No tenderness.     Right lower leg: No edema.     Left lower leg: No edema.  Lymphadenopathy:     Cervical: No cervical adenopathy.  Skin:    General: Skin is warm and dry.  Neurological:     General: No focal deficit present.     Mental Status: She is alert and oriented to person, place, and time.  Psychiatric:        Mood and Affect: Mood normal.        Behavior: Behavior normal.      Assessment/Plan: The patient is scheduled for right breast reconstruction with latissimus myocutaneous flap with Dr. Marla Roe on 02/07/2020.    Patient had questions about removal of port-a-cath during this  operation on 3/22. I will consult with team to determine if that is something we can coordinate this surgery or wait until expander to implant exchange.  Patient provided full consent form for breast reconstruction, general surgical risks.  Patient was provided time prior to appointment  to read through entire consent form and initial & that she understands all the risks associated with upcoming surgery.  We also discussed the risks during her preop appointment.  She had plenty of time to ask any questions, recommend calling if she has any further questions prior to surgery.   Prescription sent to pharmacy. Covid test scheduled.  Mammogram: bilateral mastectomy Smoking status: quit > 40 years ago Caprini score: 6, high, recommend SCDs, early ambulation, possible chemoppx. BMI>25, currently on hormone therapy, surgery > 45 min, age >61-74.   The risks that can be encountered with and after placement of a breast expander placement were discussed and include the following but not limited to these: bleeding, infection, delayed healing, anesthesia risks, skin sensation changes, injury to structures including nerves, blood vessels, and muscles which may be temporary or permanent, allergies to tape, suture materials and glues, blood products, topical preparations or injected agents, skin contour irregularities, skin discoloration and swelling, deep vein thrombosis, cardiac and pulmonary complications, pain, which may persist, fluid accumulation, wrinkling of the skin over the expander, changes in nipple or breast sensation, expander leakage or rupture, faulty position of the expander, persistent pain, formation of tight scar tissue around the expander (capsular contracture), possible need for revisional surgery or staged procedures.   Electronically signed by: Carola Rhine Myelle Poteat, PA-C 01/26/2020 9:32 AM

## 2020-01-26 ENCOUNTER — Encounter: Payer: Self-pay | Admitting: *Deleted

## 2020-01-26 ENCOUNTER — Ambulatory Visit (INDEPENDENT_AMBULATORY_CARE_PROVIDER_SITE_OTHER): Payer: 59 | Admitting: Surgery

## 2020-01-26 ENCOUNTER — Encounter: Payer: Self-pay | Admitting: Surgical

## 2020-01-26 ENCOUNTER — Other Ambulatory Visit: Payer: Self-pay

## 2020-01-26 ENCOUNTER — Ambulatory Visit (INDEPENDENT_AMBULATORY_CARE_PROVIDER_SITE_OTHER): Payer: 59 | Admitting: Surgical

## 2020-01-26 ENCOUNTER — Encounter: Payer: Self-pay | Admitting: Surgery

## 2020-01-26 VITALS — BP 122/84 | HR 67 | Temp 97.5°F | Ht 65.0 in | Wt 157.2 lb

## 2020-01-26 DIAGNOSIS — D0512 Intraductal carcinoma in situ of left breast: Secondary | ICD-10-CM

## 2020-01-26 DIAGNOSIS — Z9013 Acquired absence of bilateral breasts and nipples: Secondary | ICD-10-CM

## 2020-01-26 DIAGNOSIS — C50411 Malignant neoplasm of upper-outer quadrant of right female breast: Secondary | ICD-10-CM

## 2020-01-26 MED ORDER — DIAZEPAM 2 MG PO TABS: 2.0000 mg | ORAL_TABLET | Freq: Two times a day (BID) | ORAL | 0 refills | Status: DC | PRN

## 2020-01-26 MED ORDER — HYDROCODONE-ACETAMINOPHEN 5-325 MG PO TABS: 1.0000 | ORAL_TABLET | Freq: Four times a day (QID) | ORAL | 0 refills | Status: AC | PRN

## 2020-01-26 MED ORDER — ONDANSETRON HCL 4 MG PO TABS: 4.0000 mg | ORAL_TABLET | Freq: Three times a day (TID) | ORAL | 0 refills | Status: DC | PRN

## 2020-01-26 MED ORDER — CEPHALEXIN 500 MG PO CAPS: 500.0000 mg | ORAL_CAPSULE | Freq: Four times a day (QID) | ORAL | 0 refills | Status: AC

## 2020-01-26 NOTE — Progress Notes (Signed)
01/26/2020  History of Present Illness: Megan Vega is a 64 y.o. female status post right modified radical mastectomy as well as left mastectomy and sentinel lymph node biopsy on 07/10/2018 for right breast cancer and left breast DCIS.  She is also status post neoadjuvant chemotherapy as well as status post radiation.  She presents today for follow-up.  She underwent initial staging of breast reconstruction with Dr. Marla Roe on 08/30/2019 with bilateral tissue expanders.  She is due for surgery with Dr. Marla Roe on 02/07/2020 for right breast reconstruction with latissimus flap.  She is very much looking forward to her surgery.  From the cancer standpoint, patient has been doing very well and has not noticed any issues with the incisions, any masses, or any other concerns.  Past Medical History: Past Medical History:  Diagnosis Date  . Ankle fracture   . Cancer [facility])    Basal Cell Carcinoma, about 2011 chest  . Cataract   . Family history of adverse reaction to anesthesia    DAUGHTER-N/V  . Hemorrhoids   . History of methicillin resistant staphylococcus aureus (MRSA) 2015  . Migraines    MIGRAINES occ  . Personal history of chemotherapy   . Personal history of radiation therapy      Past Surgical History: Past Surgical History:  Procedure Laterality Date  . BREAST BIOPSY Bilateral 10/29/2017   L breast DCIS, R breast invasive mammary carcinoma of no special type, ER/PR+, Her2/neu 1+  . BREAST RECONSTRUCTION WITH PLACEMENT OF TISSUE EXPANDER AND FLEX HD (ACELLULAR HYDRATED DERMIS) Bilateral 08/30/2019   Procedure: BREAST RECONSTRUCTION WITH PLACEMENT OF TISSUE EXPANDER AND FLEX HD (ACELLULAR HYDRATED DERMIS);  Surgeon: Wallace Going, DO;  Location: [facility] ORS;  Service: Plastics;  Laterality: Bilateral;  2.5 hours, please  . CESAREAN SECTION    . COLONOSCOPY  2012  . COLONOSCOPY WITH PROPOFOL N/A 12/01/2018   Procedure: COLONOSCOPY WITH PROPOFOL;  Surgeon: Lucilla Lame,  MD;  Location: [facility] ENDOSCOPY;  Service: Endoscopy;  Laterality: N/A;  . FRACTURE SURGERY     ankle  . IRRIGATION AND DEBRIDEMENT ABSCESS Right 03/12/2018   Procedure: IRRIGATION AND DEBRIDEMENT RIGHT AXILLARY ABSCESS;  Surgeon: Robert Bellow, MD;  Location: [facility] ORS;  Service: General;  Laterality: Right;  . MANDIBLE FRACTURE SURGERY    . MASTECTOMY    . MASTECTOMY MODIFIED RADICAL Right 07/10/2018   ypT2 ypN2a ER/PR positive, HER-2/neu negative  Surgeon: Robert Bellow, MD;  Location: [facility] ORS;  Service: General;  Laterality: Right;  . PORTACATH PLACEMENT Left 11/28/2017   Procedure: INSERTION PORT-A-CATH;  Surgeon: Robert Bellow, MD;  Location: [facility] ORS;  Service: General;  Laterality: Left;  . SIMPLE MASTECTOMY WITH AXILLARY SENTINEL NODE BIOPSY Left 07/10/2018   High-grade DCIS SIMPLE MASTECTOMY;  Surgeon: Robert Bellow, MD;  Location: [facility] ORS;  Service: General;  Laterality: Left;    Home Medications: Prior to Admission medications   Medication Sig Start Date End Date Taking? Authorizing Provider  acetaminophen (TYLENOL) 500 MG tablet Take 1,000 mg by mouth every 6 (six) hours as needed for moderate pain or headache.   Yes [provider]  b complex vitamins tablet Take 1 tablet by mouth daily.   Yes [provider]  CALCIUM PO Take 1,200 mg by mouth daily. 1200 MG Tablets   Yes [provider]  cephALEXin (KEFLEX) 500 MG capsule Take 1 capsule (500 mg total) by mouth 4 (four) times daily for 5 days. 01/26/20 01/31/20 Yes Scheeler, Carola Rhine, PA-C  gabapentin (  NEURONTIN) 100 MG capsule TAKE 2 CAPSULES AT BEDTIME 01/10/20  Yes Corcoran, Drue Second, MD  HYDROcodone-acetaminophen (NORCO) 5-325 MG tablet Take 1 tablet by mouth every 6 (six) hours as needed for up to 5 days for severe pain. 01/26/20 01/31/20 Yes Scheeler, Carola Rhine, PA-C  letrozole (FEMARA) 2.5 MG tablet TAKE 1 TABLET BY MOUTH EVERY DAY 11/21/19  Yes Corcoran, Melissa C, MD  loratadine  (CLARITIN) 10 MG tablet Take 10 mg by mouth every morning.    Yes [provider]  Melatonin 3 MG CAPS Take 3 mg by mouth at bedtime as needed (sleep.).    Yes [provider]  Multiple Vitamin (MULTIVITAMIN WITH MINERALS) TABS tablet Take 1 tablet by mouth daily. Centrum Silver   Yes [provider]  ondansetron (ZOFRAN) 4 MG tablet Take 1 tablet (4 mg total) by mouth every 8 (eight) hours as needed for nausea or vomiting. 01/26/20  Yes Scheeler, Carola Rhine, PA-C  Probiotic Product (PROBIOTIC PO) Take 1 capsule by mouth daily.   Yes [provider]  tetrahydrozoline 0.05 % ophthalmic solution Place 1 drop into both eyes 3 (three) times daily as needed (for dry/red eyes.).   Yes [provider]  diazepam (VALIUM) 2 MG tablet Take 1 tablet (2 mg total) by mouth every 12 (twelve) hours as needed for anxiety or muscle spasms. Patient not taking: Reported on 01/26/2020 01/26/20   Scheeler, Carola Rhine, PA-C  mupirocin ointment (BACTROBAN) 2 % Apply 1 application topically 2 (two) times daily. X 5 days to nose and bid x 7-14 days right leg Patient not taking: Reported on 01/26/2020 09/10/18   McLean-Scocuzza, Nino Glow, MD  polyethylene glycol (MIRALAX / GLYCOLAX) packet Take 17 g by mouth daily as needed (for constipation.).     [provider]    Allergies: Allergies  Allergen Reactions  . Peanut-Containing Drug Products Swelling and Other (See Comments)    Only when eating too many peanuts, swelling with lips and tingly face    Review of Systems: Review of Systems  Constitutional: Negative for chills and fever.  Respiratory: Negative for shortness of breath.   Cardiovascular: Negative for chest pain.  Gastrointestinal: Negative for abdominal pain, nausea and vomiting.    Physical Exam There were no vitals taken for this visit. CONSTITUTIONAL: No acute distress HEENT:  Normocephalic, atraumatic, extraocular motion intact. RESPIRATORY:  Lungs  are clear, and breath sounds are equal bilaterally. Normal respiratory effort without pathologic use of accessory muscles. CARDIOVASCULAR: Heart is regular without murmurs, gallops, or rubs. BREAST: Right breast with mastectomy site well-healed with no evidence of wound breakdown or any palpable masses at the scar itself.  Patient has a tissue expander but otherwise there is no palpable masses Over the right breast area.  There is no right axillary or supraclavicular lymphadenopathy.  The left breast is also status post mastectomy with well-healed scar but also no palpable masses.  Patient has a left chest port with well-healed scar.  No palpable masses, skin changes, and no left axillary or supraclavicular lymphadenopathy. NEUROLOGIC:  Motor and sensation is grossly normal.  Cranial nerves are grossly intact. PSYCH:  Alert and oriented to person, place and time. Affect is normal.  Labs/Imaging: None recently  Assessment and Plan: This is a 64 y.o. female status post right modified radical mastectomy and left mastectomy with sentinel lymph node biopsy on 07/10/2018.  -Patient is doing very well from our standpoint with no evidence of recurrence at this point.  She is  due for surgery with Dr. Marla Roe later this month for right breast reconstruction.  She is excited about her surgery although realizes this is only the right side and will still have the left side to go for surgery in the future. -No concerns at this point from our standpoint. -The patient reports that there is been plans for Port-A-Cath removal in the past but have not come to fruition.  I asked her to check with Dr. Marla Roe if it would be okay for [place] to excise the port as an office procedure either before or after her right breast surgery.  Just want to make sure that we do not cause any troubles given that she has tissue expanders on the left side.  If she is okay with [place] proceeding, then the patient will call the office to set up  a procedure appointment for Port-A-Cath removal. -Otherwise patient will follow-up in 6 months for another exam.  Face-to-face time spent with the patient and care providers was 15 minutes, with more than 50% of the time spent counseling, educating, and coordinating care of the patient.     Melvyn Neth, [facility]

## 2020-01-26 NOTE — Research (Signed)
Patient Megan Vega returned to clinic this afternoon for purpose of completing her 12 month assessment for the [id number] [name] study. Patient was seen by her surgeon Dr. Hampton Abbot earlier this afternoon and reports seeing her plastic surgeon, Dr. Aris Everts this morning. She is preparing for a reconstruction procedure in about 2 weeks. Her weight was 156.6 lbs. Temp - 97.6, P-78, R-16, B/P-118/78 on left as measured here at the [facility].  Patient saw Dr. Mike Gip for oncology follow up via video visit on 11/25/19. She will return to see Dr. Mike Gip again on 02/21/20.  [name] returned her medication diaries along with her old bottle of aspirin/placebo containing #58 tablets. Questioned patient about having to stop the study drug for her upcoming surgery and she states they did not tell her to this time. Discussed that this was likely an oversight and patient states she will call them tomorrow to clarify. She was dispensed a new bottle of patient specific aspirin/placebo - 300mg  tablets, #200 with study ID [id number] on the label, which was verified by myself and Andria Rhein, PharmD. She was also given 6 months of medication diaries to last until she returns to the clinic in September, 2021. Patient was reminded to take the study drug at the same time daily with food or a full glass of water. States she usually takes her study drug in the evening with supper so that she does not have an empty stomach. Solicited AEs were collected prior to medication exchange, and they remain basically unchanged since last assessed. Patient reports she took Ibuprofen for a headache x 1 since she was last evaluated, and denies any further NSAID use. [name] is aware that she can call at any time if she has questions or develops new side effects. All AEs with grade and attribution are listed below:   [id number] [name] Cycle: 12 month Event Grade Onset Date Resolved Date Drug Name Attribution Treatment Comments  GI Bleeding  0        Intracranial hemorrhage 0        Epistaxis 0        Hematuria 0        Dyspepsia 1      Only occasionally after eating spicy foods  Gastritis 0        Bruising 0        Chronic tremors 1 baseline   unrelated    Peripheral sensory neuropathy 1 baseline   unrelated B-complex vitamins Residual from chemotherapy  Occasional migraines 1 baseline   unrelated prn Excedrin migraine   Lymphedema in Rt upper arm 1 05/24/2019   mastectomy Physical therapy "mild" per MD  [name], BSN, MHA, OCN 01/26/2020 4:15 PM

## 2020-01-26 NOTE — Patient Instructions (Addendum)
Dr.Piscoya discussed with patient he recommends a six month office visit follow up appointment. Dr.Piscoya advised patient to discuss Port Removal Procedure with Dr.Dillingham to see if patient can have it removed by Dr.Piscoya.   Breast Self-Awareness Breast self-awareness means being familiar with how your breasts look and feel. It involves checking your breasts regularly and reporting any changes to your health care provider. Practicing breast self-awareness is important. Sometimes changes may not be harmful (are benign), but sometimes a change in your breasts can be a sign of a serious medical problem. It is important to learn how to do this procedure correctly so that you can catch problems early, when treatment is more likely to be successful. All women should practice breast self-awareness, including women who have had breast implants. What you need:  A mirror.  A well-lit room. How to do a breast self-exam A breast self-exam is one way to learn what is normal for your breasts and whether your breasts are changing. To do a breast self-exam: Look for changes  1. Remove all the clothing above your waist. 2. Stand in front of a mirror in a room with good lighting. 3. Put your hands on your hips. 4. Push your hands firmly downward. 5. Compare your breasts in the mirror. Look for differences between them (asymmetry), such as: ? Differences in shape. ? Differences in size. ? Puckers, dips, and bumps in one breast and not the other. 6. Look at each breast for changes in the skin, such as: ? Redness. ? Scaly areas. 7. Look for changes in your nipples, such as: ? Discharge. ? Bleeding. ? Dimpling. ? Redness. ? A change in position. Feel for changes Carefully feel your breasts for lumps and changes. It is best to do this while lying on your back on the floor, and again while sitting or standing in the tub or shower with soapy water on your skin. Feel each breast in the following  way: 1. Place the arm on the side of the breast you are examining above your head. 2. Feel your breast with the other hand. 3. Start in the nipple area and make -inch (2 cm) overlapping circles to feel your breast. Use the pads of your three middle fingers to do this. Apply light pressure, then medium pressure, then firm pressure. The light pressure will allow you to feel the tissue closest to the skin. The medium pressure will allow you to feel the tissue that is a little deeper. The firm pressure will allow you to feel the tissue close to the ribs. 4. Continue the overlapping circles, moving downward over the breast until you feel your ribs below your breast. 5. Move one finger-width toward the center of the body. Continue to use the -inch (2 cm) overlapping circles to feel your breast as you move slowly up toward your collarbone. 6. Continue the up-and-down exam using all three pressures until you reach your armpit.  Write down what you find Writing down what you find can help you remember what to discuss with your health care provider. Write down:  What is normal for each breast.  Any changes that you find in each breast, including: ? The kind of changes you find. ? Any pain or tenderness. ? Size and location of any lumps.  Where you are in your menstrual cycle, if you are still menstruating. General tips and recommendations  Examine your breasts every month.  If you are breastfeeding, the best time to examine your breasts is  after a feeding or after using a breast pump.  If you menstruate, the best time to examine your breasts is 5-7 days after your period. Breasts are generally lumpier during menstrual periods, and it may be more difficult to notice changes.  With time and practice, you will become more familiar with the variations in your breasts and more comfortable with the exam. Contact a health care provider if you:  See a change in the shape or size of your breasts or  nipples.  See a change in the skin of your breast or nipples, such as a reddened or scaly area.  Have unusual discharge from your nipples.  Find a lump or thick area that was not there before.  Have pain in your breasts.  Have any concerns related to your breast health. Summary  Breast self-awareness includes looking for physical changes in your breasts, as well as feeling for any changes within your breasts.  Breast self-awareness should be performed in front of a mirror in a well-lit room.  You should examine your breasts every month. If you menstruate, the best time to examine your breasts is 5-7 days after your menstrual period.  Let your health care provider know of any changes you notice in your breasts, including changes in size, changes on the skin, pain or tenderness, or unusual fluid from your nipples. This information is not intended to replace advice given to you by your health care provider. Make sure you discuss any questions you have with your health care provider. Document Revised: 06/23/2018 Document Reviewed: 06/23/2018 Elsevier Patient Education  [place].

## 2020-02-03 ENCOUNTER — Other Ambulatory Visit: Payer: Self-pay

## 2020-02-03 ENCOUNTER — Other Ambulatory Visit
Admission: RE | Admit: 2020-02-03 | Discharge: 2020-02-03 | Disposition: A | Discharge status: Home / Self Care | Payer: 59 | Source: Ambulatory Visit | Attending: Plastic Surgery | Admitting: Plastic Surgery

## 2020-02-03 DIAGNOSIS — Z20822 Contact with and (suspected) exposure to covid-19: Secondary | ICD-10-CM | POA: Diagnosis not present

## 2020-02-03 DIAGNOSIS — Z01812 Encounter for preprocedural laboratory examination: Secondary | ICD-10-CM | POA: Insufficient documentation

## 2020-02-04 ENCOUNTER — Other Ambulatory Visit: Payer: Self-pay

## 2020-02-04 ENCOUNTER — Encounter (HOSPITAL_COMMUNITY): Payer: Self-pay | Admitting: Plastic Surgery

## 2020-02-04 LAB — SARS CORONAVIRUS 2 (TAT 6-24 HRS): SARS Coronavirus 2: NEGATIVE

## 2020-02-04 NOTE — Progress Notes (Signed)
Pt denies SOB, chest pain, and being under the care of a cardiologist. Pt stated that PCP is Mable [place], [place]. Pt denies having a stress test, echo and cardiac cath. Pt denies having an EKG and chest x ray in the last year. Pt denies recent labs. Pt made aware to stop taking  Aspirin (unless otherwise advised by surgeon), vitamins, fish oil, Melatonin and herbal medications. Do not take any NSAIDs ie: Ibuprofen, Advil, Naproxen (Aleve), Motrin, BC and Goody Powder. Pt reminded to quarantine. Pt verbalized understanding of all pre-op instructions.

## 2020-02-06 NOTE — Anesthesia Preprocedure Evaluation (Addendum)
Anesthesia Evaluation  Patient identified by MRN, date of birth, ID band Patient awake    Reviewed: Allergy & Precautions, NPO status , Patient's Chart, lab work & pertinent test results  Airway Mallampati: II  TM Distance: >3 FB Neck ROM: Full    Dental no notable dental hx.    Pulmonary former smoker,    Pulmonary exam normal breath sounds clear to auscultation       Cardiovascular negative cardio ROS Normal cardiovascular exam Rhythm:Regular Rate:Normal     Neuro/Psych  Headaches, negative psych ROS   GI/Hepatic negative GI ROS, Neg liver ROS,   Endo/Other  Breast cancer s/p chemo and radiation  Renal/GU negative Renal ROS     Musculoskeletal negative musculoskeletal ROS (+)   Abdominal   Peds  Hematology negative hematology ROS (+)   Anesthesia Other Findings S/p Mastectomy, Bilateral; Acquired absence of both breasts  Reproductive/Obstetrics                            Anesthesia Physical Anesthesia Plan  ASA: II  Anesthesia Plan: General   Post-op Pain Management:    Induction: Intravenous  PONV Risk Score and Plan: 3 and Ondansetron, Dexamethasone, Midazolam, Scopolamine patch - Pre-op and Treatment may vary due to age or medical condition  Airway Management Planned: Oral ETT  Additional Equipment:   Intra-op Plan:   Post-operative Plan: Extubation in OR  Informed Consent: I have reviewed the patients History and Physical, chart, labs and discussed the procedure including the risks, benefits and alternatives for the proposed anesthesia with the patient or authorized representative who has indicated his/her understanding and acceptance.     Dental advisory given  Plan Discussed with: CRNA  Anesthesia Plan Comments:        Anesthesia Quick Evaluation

## 2020-02-07 ENCOUNTER — Inpatient Hospital Stay (HOSPITAL_COMMUNITY): Payer: 59 | Admitting: Certified Registered Nurse Anesthetist

## 2020-02-07 ENCOUNTER — Inpatient Hospital Stay (HOSPITAL_COMMUNITY)
Admission: RE | Admit: 2020-02-07 | Discharge: 2020-02-08 | DRG: 941 | Disposition: A | Discharge status: Home / Self Care | Payer: 59 | Attending: Plastic Surgery | Admitting: Plastic Surgery

## 2020-02-07 ENCOUNTER — Encounter (HOSPITAL_COMMUNITY)
Admission: RE | Disposition: A | Discharge status: Home / Self Care | Payer: Self-pay | Source: Home / Self Care | Attending: Plastic Surgery

## 2020-02-07 ENCOUNTER — Encounter (HOSPITAL_COMMUNITY): Payer: Self-pay | Admitting: Plastic Surgery

## 2020-02-07 ENCOUNTER — Other Ambulatory Visit: Payer: Self-pay

## 2020-02-07 DIAGNOSIS — Z923 Personal history of irradiation: Secondary | ICD-10-CM

## 2020-02-07 DIAGNOSIS — Z9011 Acquired absence of right breast and nipple: Principal | ICD-10-CM

## 2020-02-07 DIAGNOSIS — Z85828 Personal history of other malignant neoplasm of skin: Secondary | ICD-10-CM | POA: Diagnosis not present

## 2020-02-07 DIAGNOSIS — Z91010 Allergy to peanuts: Secondary | ICD-10-CM | POA: Diagnosis not present

## 2020-02-07 DIAGNOSIS — Z833 Family history of diabetes mellitus: Secondary | ICD-10-CM

## 2020-02-07 DIAGNOSIS — Z170 Estrogen receptor positive status [ER+]: Secondary | ICD-10-CM

## 2020-02-07 DIAGNOSIS — Z853 Personal history of malignant neoplasm of breast: Secondary | ICD-10-CM

## 2020-02-07 DIAGNOSIS — Z9221 Personal history of antineoplastic chemotherapy: Secondary | ICD-10-CM

## 2020-02-07 DIAGNOSIS — H269 Unspecified cataract: Secondary | ICD-10-CM | POA: Diagnosis present

## 2020-02-07 DIAGNOSIS — Z8249 Family history of ischemic heart disease and other diseases of the circulatory system: Secondary | ICD-10-CM | POA: Diagnosis not present

## 2020-02-07 DIAGNOSIS — Z801 Family history of malignant neoplasm of trachea, bronchus and lung: Secondary | ICD-10-CM | POA: Diagnosis not present

## 2020-02-07 DIAGNOSIS — C50411 Malignant neoplasm of upper-outer quadrant of right female breast: Secondary | ICD-10-CM | POA: Diagnosis not present

## 2020-02-07 DIAGNOSIS — Z20822 Contact with and (suspected) exposure to covid-19: Secondary | ICD-10-CM | POA: Diagnosis present

## 2020-02-07 DIAGNOSIS — G43909 Migraine, unspecified, not intractable, without status migrainosus: Secondary | ICD-10-CM | POA: Diagnosis present

## 2020-02-07 DIAGNOSIS — Z800 Family history of malignant neoplasm of digestive organs: Secondary | ICD-10-CM

## 2020-02-07 DIAGNOSIS — Z9013 Acquired absence of bilateral breasts and nipples: Secondary | ICD-10-CM | POA: Diagnosis present

## 2020-02-07 DIAGNOSIS — Z79890 Hormone replacement therapy (postmenopausal): Secondary | ICD-10-CM | POA: Diagnosis not present

## 2020-02-07 DIAGNOSIS — Z87891 Personal history of nicotine dependence: Secondary | ICD-10-CM | POA: Diagnosis not present

## 2020-02-07 HISTORY — PX: BREAST RECONSTRUCTION: SHX9

## 2020-02-07 HISTORY — DX: Presence of spectacles and contact lenses: Z97.3

## 2020-02-07 HISTORY — PX: LATISSIMUS FLAP TO BREAST: SHX5357

## 2020-02-07 HISTORY — PX: PORT-A-CATH REMOVAL: SHX5289

## 2020-02-07 LAB — SURGICAL PCR SCREEN
MRSA, PCR: NEGATIVE
Staphylococcus aureus: NEGATIVE

## 2020-02-07 LAB — CBC
HCT: 42.2 % (ref 36.0–46.0)
Hemoglobin: 13.9 g/dL (ref 12.0–15.0)
MCH: 28.8 pg (ref 26.0–34.0)
MCHC: 32.9 g/dL (ref 30.0–36.0)
MCV: 87.6 fL (ref 80.0–100.0)
Platelets: 205 10*3/uL (ref 150–400)
RBC: 4.82 MIL/uL (ref 3.87–5.11)
RDW: 12.3 % (ref 11.5–15.5)
WBC: 3.3 10*3/uL — ABNORMAL LOW (ref 4.0–10.5)
nRBC: 0 % (ref 0.0–0.2)

## 2020-02-07 LAB — BASIC METABOLIC PANEL
Anion gap: 13 (ref 5–15)
BUN: 14 mg/dL (ref 8–23)
CO2: 20 mmol/L — ABNORMAL LOW (ref 22–32)
Calcium: 8.9 mg/dL (ref 8.9–10.3)
Chloride: 106 mmol/L (ref 98–111)
Creatinine, Ser: 0.99 mg/dL (ref 0.44–1.00)
GFR calc Af Amer: >60 mL/min (ref >60)
GFR calc non Af Amer: >60 mL/min (ref >60)
Glucose, Bld: 105 mg/dL — ABNORMAL HIGH (ref 70–99)
Potassium: 3.7 mmol/L (ref 3.5–5.1)
Sodium: 139 mmol/L (ref 135–145)

## 2020-02-07 SURGERY — RECONSTRUCTION, BREAST, USING LATISSIMUS DORSI MYOCUTANEOUS FLAP
Anesthesia: General | Site: Chest | Laterality: Right

## 2020-02-07 MED ORDER — HYDROMORPHONE HCL 1 MG/ML IJ SOLN: 0.2500 mg | INTRAMUSCULAR | Status: DC | PRN

## 2020-02-07 MED ORDER — MIDAZOLAM HCL 5 MG/5ML IJ SOLN
INTRAMUSCULAR | Status: DC | PRN
  Administered 2020-02-07: 2 mg via INTRAVENOUS

## 2020-02-07 MED ORDER — MIDAZOLAM HCL 2 MG/2ML IJ SOLN
INTRAMUSCULAR | Status: AC
  Filled 2020-02-07: qty 2

## 2020-02-07 MED ORDER — DEXAMETHASONE SODIUM PHOSPHATE 10 MG/ML IJ SOLN
INTRAMUSCULAR | Status: DC | PRN
  Administered 2020-02-07: 4 mg via INTRAVENOUS

## 2020-02-07 MED ORDER — ACETAMINOPHEN 500 MG PO TABS
1000.0000 mg | ORAL_TABLET | Freq: Once | ORAL | Status: AC
  Administered 2020-02-07: 1000 mg via ORAL
  Filled 2020-02-07: qty 2

## 2020-02-07 MED ORDER — SUGAMMADEX SODIUM 200 MG/2ML IV SOLN
INTRAVENOUS | Status: DC | PRN
  Administered 2020-02-07: 200 mg via INTRAVENOUS

## 2020-02-07 MED ORDER — HYDROMORPHONE HCL 1 MG/ML IJ SOLN: 1.0000 mg | INTRAMUSCULAR | Status: DC | PRN

## 2020-02-07 MED ORDER — SCOPOLAMINE 1 MG/3DAYS TD PT72
1.0000 | MEDICATED_PATCH | TRANSDERMAL | Status: DC
  Administered 2020-02-07: 07:00:00 1.5 mg via TRANSDERMAL
  Filled 2020-02-07: qty 1

## 2020-02-07 MED ORDER — PHENYLEPHRINE 40 MCG/ML (10ML) SYRINGE FOR IV PUSH (FOR BLOOD PRESSURE SUPPORT)
PREFILLED_SYRINGE | INTRAVENOUS | Status: DC | PRN
  Administered 2020-02-07 (×2): 120 ug via INTRAVENOUS
  Administered 2020-02-07: 40 ug via INTRAVENOUS

## 2020-02-07 MED ORDER — KCL IN DEXTROSE-NACL 20-5-0.45 MEQ/L-%-% IV SOLN
INTRAVENOUS | Status: DC
  Filled 2020-02-07 (×3): qty 1000

## 2020-02-07 MED ORDER — PROPOFOL 10 MG/ML IV BOLUS
INTRAVENOUS | Status: AC
  Filled 2020-02-07: qty 20

## 2020-02-07 MED ORDER — VISTASEAL 10 ML SINGLE DOSE KIT
10.0000 mL | PACK | CUTANEOUS | Status: DC
  Filled 2020-02-07: qty 10

## 2020-02-07 MED ORDER — ONDANSETRON 4 MG PO TBDP: 4.0000 mg | ORAL_TABLET | Freq: Four times a day (QID) | ORAL | Status: DC | PRN

## 2020-02-07 MED ORDER — KETOROLAC TROMETHAMINE 30 MG/ML IJ SOLN: 30.0000 mg | Freq: Once | INTRAMUSCULAR | Status: DC | PRN

## 2020-02-07 MED ORDER — ROCURONIUM BROMIDE 10 MG/ML (PF) SYRINGE
PREFILLED_SYRINGE | INTRAVENOUS | Status: DC | PRN
  Administered 2020-02-07: 80 mg via INTRAVENOUS
  Administered 2020-02-07: 20 mg via INTRAVENOUS

## 2020-02-07 MED ORDER — LACTATED RINGERS IV SOLN: INTRAVENOUS | Status: DC | PRN

## 2020-02-07 MED ORDER — BUPIVACAINE-EPINEPHRINE 0.25% -1:200000 IJ SOLN
INTRAMUSCULAR | Status: DC | PRN
  Administered 2020-02-07: 5 mL

## 2020-02-07 MED ORDER — LIDOCAINE 2% (20 MG/ML) 5 ML SYRINGE
INTRAMUSCULAR | Status: AC
  Filled 2020-02-07: qty 5

## 2020-02-07 MED ORDER — ROCURONIUM BROMIDE 10 MG/ML (PF) SYRINGE
PREFILLED_SYRINGE | INTRAVENOUS | Status: AC
  Filled 2020-02-07: qty 10

## 2020-02-07 MED ORDER — EPINEPHRINE PF 1 MG/ML IJ SOLN
INTRAMUSCULAR | Status: AC
  Filled 2020-02-07: qty 1

## 2020-02-07 MED ORDER — POLYETHYLENE GLYCOL 3350 17 G PO PACK: 17.0000 g | PACK | Freq: Every day | ORAL | Status: DC | PRN

## 2020-02-07 MED ORDER — CEFAZOLIN SODIUM-DEXTROSE 2-4 GM/100ML-% IV SOLN
2.0000 g | INTRAVENOUS | Status: AC
  Administered 2020-02-07: 2 g via INTRAVENOUS
  Filled 2020-02-07: qty 100

## 2020-02-07 MED ORDER — DIAZEPAM 2 MG PO TABS
2.0000 mg | ORAL_TABLET | Freq: Two times a day (BID) | ORAL | Status: DC
  Administered 2020-02-07 – 2020-02-08 (×3): 2 mg via ORAL
  Filled 2020-02-07 (×3): qty 1

## 2020-02-07 MED ORDER — PROMETHAZINE HCL 25 MG/ML IJ SOLN: 6.2500 mg | INTRAMUSCULAR | Status: DC | PRN

## 2020-02-07 MED ORDER — BUPIVACAINE HCL (PF) 0.25 % IJ SOLN
INTRAMUSCULAR | Status: AC
  Filled 2020-02-07: qty 60

## 2020-02-07 MED ORDER — ONDANSETRON HCL 4 MG/2ML IJ SOLN: 4.0000 mg | Freq: Four times a day (QID) | INTRAMUSCULAR | Status: DC | PRN

## 2020-02-07 MED ORDER — DEXMEDETOMIDINE HCL 200 MCG/2ML IV SOLN
INTRAVENOUS | Status: DC | PRN
  Administered 2020-02-07: 12 ug via INTRAVENOUS
  Administered 2020-02-07: 8 ug via INTRAVENOUS

## 2020-02-07 MED ORDER — BUPIVACAINE-EPINEPHRINE (PF) 0.5% -1:200000 IJ SOLN
INTRAMUSCULAR | Status: AC
  Filled 2020-02-07: qty 18

## 2020-02-07 MED ORDER — OXYCODONE HCL 5 MG PO TABS: 5.0000 mg | ORAL_TABLET | Freq: Once | ORAL | Status: DC | PRN

## 2020-02-07 MED ORDER — 0.9 % SODIUM CHLORIDE (POUR BTL) OPTIME
TOPICAL | Status: DC | PRN
  Administered 2020-02-07 (×3): 1000 mL

## 2020-02-07 MED ORDER — ACETAMINOPHEN 325 MG PO TABS
325.0000 mg | ORAL_TABLET | Freq: Four times a day (QID) | ORAL | Status: DC
  Administered 2020-02-07 – 2020-02-08 (×4): 325 mg via ORAL
  Filled 2020-02-07 (×4): qty 1

## 2020-02-07 MED ORDER — IBUPROFEN 400 MG PO TABS
400.0000 mg | ORAL_TABLET | Freq: Four times a day (QID) | ORAL | Status: DC
  Administered 2020-02-07 – 2020-02-08 (×4): 400 mg via ORAL
  Filled 2020-02-07 (×4): qty 1

## 2020-02-07 MED ORDER — DIPHENHYDRAMINE HCL 12.5 MG/5ML PO ELIX: 12.5000 mg | ORAL_SOLUTION | Freq: Four times a day (QID) | ORAL | Status: DC | PRN

## 2020-02-07 MED ORDER — MUPIROCIN 2 % EX OINT
1.0000 "application " | TOPICAL_OINTMENT | Freq: Once | CUTANEOUS | Status: AC
  Administered 2020-02-07: 1 via TOPICAL
  Filled 2020-02-07: qty 22

## 2020-02-07 MED ORDER — DIPHENHYDRAMINE HCL 50 MG/ML IJ SOLN: 12.5000 mg | Freq: Four times a day (QID) | INTRAMUSCULAR | Status: DC | PRN

## 2020-02-07 MED ORDER — OXYCODONE HCL 5 MG/5ML PO SOLN: 5.0000 mg | Freq: Once | ORAL | Status: DC | PRN

## 2020-02-07 MED ORDER — CEFAZOLIN SODIUM-DEXTROSE 2-4 GM/100ML-% IV SOLN
2.0000 g | Freq: Three times a day (TID) | INTRAVENOUS | Status: DC
  Administered 2020-02-07 – 2020-02-08 (×3): 2 g via INTRAVENOUS
  Filled 2020-02-07 (×3): qty 100

## 2020-02-07 MED ORDER — VISTASEAL 10 ML SINGLE DOSE KIT
PACK | CUTANEOUS | Status: DC | PRN
  Administered 2020-02-07: 10 mL via TOPICAL

## 2020-02-07 MED ORDER — ONDANSETRON HCL 4 MG/2ML IJ SOLN
INTRAMUSCULAR | Status: AC
  Filled 2020-02-07: qty 2

## 2020-02-07 MED ORDER — ONDANSETRON HCL 4 MG/2ML IJ SOLN
INTRAMUSCULAR | Status: DC | PRN
  Administered 2020-02-07: 4 mg via INTRAVENOUS

## 2020-02-07 MED ORDER — LIDOCAINE 2% (20 MG/ML) 5 ML SYRINGE
INTRAMUSCULAR | Status: DC | PRN
  Administered 2020-02-07: 100 mg via INTRAVENOUS

## 2020-02-07 MED ORDER — HYDROCODONE-ACETAMINOPHEN 5-325 MG PO TABS: 1.0000 | ORAL_TABLET | ORAL | Status: DC | PRN

## 2020-02-07 MED ORDER — PROPOFOL 10 MG/ML IV BOLUS
INTRAVENOUS | Status: DC | PRN
  Administered 2020-02-07: 30 mg via INTRAVENOUS
  Administered 2020-02-07: 130 mg via INTRAVENOUS

## 2020-02-07 MED ORDER — PHENYLEPHRINE HCL-NACL 10-0.9 MG/250ML-% IV SOLN
INTRAVENOUS | Status: DC | PRN
  Administered 2020-02-07: 30 ug/min via INTRAVENOUS

## 2020-02-07 MED ORDER — SENNA 8.6 MG PO TABS
1.0000 | ORAL_TABLET | Freq: Two times a day (BID) | ORAL | Status: DC
  Administered 2020-02-07 – 2020-02-08 (×2): 8.6 mg via ORAL
  Filled 2020-02-07 (×2): qty 1

## 2020-02-07 MED ORDER — FENTANYL CITRATE (PF) 250 MCG/5ML IJ SOLN
INTRAMUSCULAR | Status: AC
  Filled 2020-02-07: qty 5

## 2020-02-07 MED ORDER — FENTANYL CITRATE (PF) 250 MCG/5ML IJ SOLN
INTRAMUSCULAR | Status: DC | PRN
  Administered 2020-02-07: 50 ug via INTRAVENOUS
  Administered 2020-02-07: 25 ug via INTRAVENOUS
  Administered 2020-02-07: 50 ug via INTRAVENOUS
  Administered 2020-02-07: 25 ug via INTRAVENOUS
  Administered 2020-02-07: 100 ug via INTRAVENOUS

## 2020-02-07 MED ORDER — SODIUM CHLORIDE 0.9 % IV SOLN
INTRAVENOUS | Status: DC | PRN
  Administered 2020-02-07: 500 mL

## 2020-02-07 MED ORDER — CHLORHEXIDINE GLUCONATE CLOTH 2 % EX PADS: 6.0000 | MEDICATED_PAD | Freq: Once | CUTANEOUS | Status: DC

## 2020-02-07 MED ORDER — DEXAMETHASONE SODIUM PHOSPHATE 10 MG/ML IJ SOLN
INTRAMUSCULAR | Status: AC
  Filled 2020-02-07: qty 1

## 2020-02-07 MED ORDER — SODIUM CHLORIDE 0.9 % IV SOLN
INTRAVENOUS | Status: AC
  Filled 2020-02-07: qty 500000

## 2020-02-07 SURGICAL SUPPLY — 68 items
ADH SKN CLS APL DERMABOND .7 (GAUZE/BANDAGES/DRESSINGS) ×2
APPLIER CLIP 9.375 MED OPEN (MISCELLANEOUS) ×3
APR CLP MED 9.3 20 MLT OPN (MISCELLANEOUS) ×2
BAG DECANTER FOR FLEXI CONT (MISCELLANEOUS) ×3 IMPLANT
BINDER BREAST XLRG (GAUZE/BANDAGES/DRESSINGS) ×1 IMPLANT
BIOPATCH RED 1 DISK 7.0 (GAUZE/BANDAGES/DRESSINGS) ×6 IMPLANT
BLADE SURG 10 STRL SS (BLADE) ×3 IMPLANT
BLADE SURG 15 STRL LF DISP TIS (BLADE) ×2 IMPLANT
BLADE SURG 15 STRL SS (BLADE) ×3
CANISTER SUCT 3000ML PPV (MISCELLANEOUS) ×3 IMPLANT
CLIP APPLIE 9.375 MED OPEN (MISCELLANEOUS) ×2 IMPLANT
CONNECTOR 5 IN 1 STRAIGHT STRL (MISCELLANEOUS) ×3 IMPLANT
COVER SURGICAL LIGHT HANDLE (MISCELLANEOUS) ×3 IMPLANT
DECANTER SPIKE VIAL GLASS SM (MISCELLANEOUS) ×3 IMPLANT
DERMABOND ADVANCED (GAUZE/BANDAGES/DRESSINGS) ×1
DERMABOND ADVANCED .7 DNX12 (GAUZE/BANDAGES/DRESSINGS) ×4 IMPLANT
DRAIN CHANNEL 19F RND (DRAIN) ×7 IMPLANT
DRAPE HALF SHEET 40X57 (DRAPES) ×8 IMPLANT
DRAPE INCISE 23X17 IOBAN STRL (DRAPES)
DRAPE INCISE 23X17 STRL (DRAPES) ×2 IMPLANT
DRAPE INCISE IOBAN 23X17 STRL (DRAPES) IMPLANT
DRAPE INCISE IOBAN 85X60 (DRAPES) ×1 IMPLANT
DRAPE ORTHO SPLIT 77X108 STRL (DRAPES) ×12
DRAPE SURG ORHT 6 SPLT 77X108 (DRAPES) ×4 IMPLANT
DRAPE WARM FLUID 44X44 (DRAPES) ×3 IMPLANT
DRSG MEPILEX BORDER 4X4 (GAUZE/BANDAGES/DRESSINGS) ×1 IMPLANT
DRSG MEPILEX BORDER 4X8 (GAUZE/BANDAGES/DRESSINGS) ×3 IMPLANT
DRSG PAD ABDOMINAL 8X10 ST (GAUZE/BANDAGES/DRESSINGS) ×6 IMPLANT
DRSG TEGADERM 4X4.75 (GAUZE/BANDAGES/DRESSINGS) ×1 IMPLANT
ELECT BLADE 4.0 EZ CLEAN MEGAD (MISCELLANEOUS) ×3
ELECT BLADE 6.5 EXT (BLADE) IMPLANT
ELECT CAUTERY BLADE 6.4 (BLADE) ×3 IMPLANT
ELECT REM PT RETURN 9FT ADLT (ELECTROSURGICAL) ×3
ELECTRODE BLDE 4.0 EZ CLN MEGD (MISCELLANEOUS) ×2 IMPLANT
ELECTRODE REM PT RTRN 9FT ADLT (ELECTROSURGICAL) ×2 IMPLANT
EVACUATOR SILICONE 100CC (DRAIN) ×7 IMPLANT
GAUZE SPONGE 2X2 8PLY STRL LF (GAUZE/BANDAGES/DRESSINGS) IMPLANT
GAUZE SPONGE 4X4 12PLY STRL (GAUZE/BANDAGES/DRESSINGS) ×6 IMPLANT
GLOVE BIO SURGEON STRL SZ 6.5 (GLOVE) ×6 IMPLANT
GOWN STRL REUS W/ TWL LRG LVL3 (GOWN DISPOSABLE) ×4 IMPLANT
GOWN STRL REUS W/TWL LRG LVL3 (GOWN DISPOSABLE) ×18
KIT BASIN OR (CUSTOM PROCEDURE TRAY) ×3 IMPLANT
NDL HYPO 25GX1X1/2 BEV (NEEDLE) ×2 IMPLANT
NEEDLE HYPO 25GX1X1/2 BEV (NEEDLE) ×3 IMPLANT
NS IRRIG 1000ML POUR BTL (IV SOLUTION) ×7 IMPLANT
PACK GENERAL/GYN (CUSTOM PROCEDURE TRAY) ×3 IMPLANT
PAD ARMBOARD 7.5X6 YLW CONV (MISCELLANEOUS) ×5 IMPLANT
PIN SAFETY STERILE (MISCELLANEOUS) ×6 IMPLANT
SPONGE GAUZE 2X2 STER 10/PKG (GAUZE/BANDAGES/DRESSINGS) ×1
SPONGE LAP 18X18 RF (DISPOSABLE) ×1 IMPLANT
STAPLER VISISTAT 35W (STAPLE) ×3 IMPLANT
STRIP CLOSURE SKIN 1/2X4 (GAUZE/BANDAGES/DRESSINGS) IMPLANT
SUT MNCRL AB 3-0 PS2 18 (SUTURE) ×8 IMPLANT
SUT MNCRL AB 4-0 PS2 18 (SUTURE) ×9 IMPLANT
SUT MON AB 5-0 PS2 18 (SUTURE) ×9 IMPLANT
SUT SILK 3 0 (SUTURE) ×9
SUT SILK 3-0 FS1 18XBRD (SUTURE) ×4 IMPLANT
SUT VIC AB 3-0 SH 27 (SUTURE) ×3
SUT VIC AB 3-0 SH 27X BRD (SUTURE) IMPLANT
SUT VIC AB 3-0 SH 8-18 (SUTURE) ×2 IMPLANT
SUT VICRYL AB 3 0 TIES (SUTURE) ×1 IMPLANT
SYR BULB IRRIGATION 50ML (SYRINGE) ×3 IMPLANT
SYR CONTROL 10ML LL (SYRINGE) ×3 IMPLANT
TOWEL GREEN STERILE (TOWEL DISPOSABLE) ×3 IMPLANT
TOWEL GREEN STERILE FF (TOWEL DISPOSABLE) ×3 IMPLANT
TRAY FOLEY MTR SLVR 14FR STAT (SET/KITS/TRAYS/PACK) IMPLANT
TUBE CONNECTING 12X1/4 (SUCTIONS) ×3 IMPLANT
YANKAUER SUCT BULB TIP NO VENT (SUCTIONS) ×3 IMPLANT

## 2020-02-07 NOTE — Transfer of Care (Signed)
Immediate Anesthesia Transfer of Care Note  Patient: Megan Vega  Procedure(s) Performed: LATISSIMUS MYOCUTANEOUS FLAP TO BREAST (Right Breast) REMOVAL PORT-A-CATH (Left Chest) BREAST RECONSTRUCTION (Right Breast)  Patient Location: PACU  Anesthesia Type:General  Level of Consciousness: awake  Airway & Oxygen Therapy: Patient Spontanous Breathing  Post-op Assessment: Report given to RN and Post -op Vital signs reviewed and stable  Post vital signs: Reviewed and stable  Last Vitals:  Vitals Value Taken Time  BP 134/62 02/07/20 1108  Temp    Pulse 79 02/07/20 1110  Resp 16 02/07/20 1110  SpO2 97 % 02/07/20 1110  Vitals shown include unvalidated device data.  Last Pain:  Vitals:   02/07/20 0634  TempSrc:   PainSc: 0-No pain         Complications: No apparent anesthesia complications

## 2020-02-07 NOTE — Interval H&P Note (Signed)
History and Physical Interval Note:  02/07/2020 7:09 AM  Megan Vega  has presented today for surgery, with the diagnosis of S/p Mastectomy, Bilateral; Acquired absence of both breasts.  The various methods of treatment have been discussed with the patient and family. After consideration of risks, benefits and other options for treatment, the patient has consented to  Procedure(s) with comments: Right breast reconstruction with latissimus myocutaneous flap (Right) - 3 hours REMOVAL PORT-A-CATH (N/A) as a surgical intervention.  The patient's history has been reviewed, patient examined, no change in status, stable for surgery.  I have reviewed the patient's chart and labs.  Questions were answered to the patient's satisfaction.     Loel Lofty Zykee Avakian

## 2020-02-07 NOTE — Op Note (Addendum)
DATE OF OPERATION: 02/07/2020  LOCATION: [facility]  PREOPERATIVE DIAGNOSIS:  1. Breast Cancer Status post right mastectomy. 2.   Acquired absence of right Breast.  POSTOPERATIVE DIAGNOSIS: Same  PROCEDURE: 1. Latissimus myocutaneous flap  to reconstruct the right breast CPT 19361 2. Removal of left port-a-cath.  SURGEON: Shrey Boike Sanger Jereld Presti, DO  ASSISTANT: Oley Balm  EBL: 100 cc  SPECIMEN: None  DRAINS: three total 75 blake round drains  CONDITION: Stable  COMPLICATIONS: None  INDICATION: The patient, Megan Vega, is a 64 y.o. female born on [birth year], is here for treatment after a right mastectomy with resulting absence of breast and asymmetry. We tried to expand with an expander in place.  Due to the radiation it was very tight and limited. The decision was made to place a latissimus for better expansion and symmetry.  PROCEDURE DETAILS:  The patient was seen on the morning of her surgery and marked for the location of the flap.  An IV was placed and IV antibiotics were given. The patient was taken to the operating room and placed on the operating room table.  A general anesthetic was administered.  A standard time out was performed and all information was confirmed to be correct by those in the room. Leg compression devices were placed on her legs.  The patient was placed in the left lateral decubitus position on a beanbag.  All key prominent points were padded and an axillary roll was placed in the dependent axilla. The ipsilateral arm was placed on a padded rest anteriosuperiorly.  She was then prepped and draped in the standard sterile fashion. The paddle design and position were confirmed.   Local with epinepherine was injected at the marked site on the back.  The procedure began by incising the skin at the marked skin paddle.  The #10 blade and bovie were used to dissect down to the latissimus muscle.  Anterior and posterior flaps were  raised to expose the latissimus muscle edges.  The skin and fat flaps were elevated to the extent necessary for release of the muscle.  The muscle was released at the superior edge of the inferior angle of the scapula.  This aids in identification of the serratus muscle to prevent lifting it with the latissimus muscle.  The larger caliber perforator were clipped and the smaller vessels controlled with electrocautery.  The dissection continued toward the midline and inferior toward the iliac crest.  The subscapular artery to the thoracodorsal artery was palpated in the axilla.  The branch to the serratus was ligated.  Care was taken to protect the vascular pedicle throughout this portion of the procedure. The paddle and muscle looked healthy throughout the case.  The posterior and anterior pockets were then connected in the plane above the muscle. The muscle from the back and the skin paddle were placed into the breast pocket. Hemostasis was achieved in the back. Two #19 blake round drains were placed and secured with 3-0 Silk. One drain was positioned inferiorly and one superiorly.  The back incision was closed in layers with buried 3-0 Vicryl, followed by 4-0 Monocryl and 5-0 Monocryl.  Dermabond and a protective dressing was applied.   The patient was repositioned onto her back and the chest was prepped and draped. The old mastectomy scar was excised and the flaps on top of the pectoralis muscle were raised. The breast pocket was inspected and hemostases was achieved with electrocautery. The expander was in place with good integration  of the ADM.  The muscle was then secured superiorly to the pectoralis muscle with 3-0 Monocryl taking care to clear the expander.  The inferior portion of the muscle was tacked to the inframammary fold with 3-0 Vicryl.  One drain was placed on this side and secured with 3-0 Silk. The flap was then closed with 3-0 Monocryl deep, followed by 4-0 Monocryl and the skin closed with 5-0  Monocryl.    Port-a-cath:  Local with epinepherine was injected at the previous incision site.  The #10 blade was used to make an incision.  The Port-a-cath was identified.  The prolene sutures were removed.  The 4-0 Vicryl was placed as a ligature suture around the tube.  The patient was placed in the trendelenburg position.  The catheter was removed while pulling the suture snug.  The suture was secured.  The capsule was excised in part.  The deep layer was closed with the 4-0 Monocryl followed by the 5-0 Monocryl.     Dermabond was applied to all incisions.  ABDs and a breast binder were applied.  The patient was allowed to wake up and taken to recovery room in stable condition at the end of the case. The family was notified at the end of the case.   The advanced practice practitioner (APP) assisted throughout the case.  The APP was essential in retraction and counter traction when needed to make the case progress smoothly.  This retraction and assistance made it possible to see the tissue plans for the procedure.  The assistance was needed for blood control, tissue re-approximation and assisted with closure of the incision site.  The [place] was signed into law in 2016 which includes the topic of electronic health records.  This provides immediate access to information in MyChart.  This includes consultation notes, operative notes, office notes, lab results and pathology reports.  If you have any questions about what you read please let us know at your next visit or call us at the office.  We are right here with you.

## 2020-02-07 NOTE — Discharge Instructions (Signed)
INSTRUCTIONS FOR AFTER BREAST SURGERY   You are getting ready to undergo breast surgery.  You will likely have some questions about what to expect following your operation.  The following information will help you and your family understand what to expect when you are discharged from the hospital.  Following these guidelines will help ensure a smooth recovery and reduce risks of complications.   Postoperative instructions include information on: diet, wound care, medications and physical activity.  AFTER SURGERY Expect to go home after the procedure.  In some cases, you may need to spend one night in the hospital for observation.  DIET Breast surgery does not require a specific diet.  However, the healthier you eat the better your body can start healing. It is important to increasing your protein intake.  This means limiting the foods with sugar and carbohydrates.  Focus on vegetables and some meat.  If you have any liposuction during your procedure be sure to drink water.  If your urine is bright yellow, then it is concentrated, and you need to drink more water.  As a general rule after surgery, you should have 8 ounces of water every hour while awake.  If you find you are persistently nauseated or unable to take in liquids let us know.  NO TOBACCO USE or EXPOSURE.  This will slow your healing process and increase the risk of a wound.  WOUND CARE If you have a drain: You can shower five days after surgery.  Clean with baby wipes until the drain is removed.    If you have steri-strips / tape directly attached to your skin leave them in place. It is OK to get these wet.  No baths, pools or hot tubs for two weeks. We close your incision to leave the smallest and best-looking scar. No ointment or creams on your incisions until given the go ahead.  Especially not Neosporin (Too many skin reactions with this one).  A few weeks after surgery you can use Mederma and start massaging the scar. We ask you to  wear your binder or sports bra for the first 6 weeks around the clock, including while sleeping. This provides added comfort and helps reduce the fluid accumulation at the surgery site.  ACTIVITY No heavy lifting until cleared by the doctor.  This usually means no more than a half-gallon of milk.  It is OK to walk and climb stairs. In fact, moving your legs is very important to decrease your risk of a blood clot.  It will also help keep you from getting deconditioned.  Every 1 to 2 hours get up and walk for 5 minutes. This will help with a quicker recovery back to normal.  Let pain be your guide so you don't do too much.  This is not the time for spring cleaning and don't plan on taking care of anyone else.  This time is for you to recover,  You will be more comfortable if you sleep and rest with your head elevated either with a few pillows under you or in a recliner.  No stomach sleeping for a three months.  WORK Everyone returns to work at different times. As a rough guide, most people take at least 1 - 2 weeks off prior to returning to work. If you need documentation for your job, bring the forms to your postoperative follow up visit.  DRIVING Arrange for someone to bring you home from the hospital.  You may be able to drive a  few days after surgery but not while taking any narcotics or valium.  BOWEL MOVEMENTS Constipation can occur after anesthesia and while taking pain medication.  It is important to stay ahead for your comfort.  We recommend taking Milk of Magnesia (2 tablespoons; twice a day) while taking the pain pills.  SEROMA This is fluid your body tried to put in the surgical site.  This is normal but if it creates tight skinny skin let us know.  It usually decreases in a few weeks.  MEDICATIONS and PAIN CONTROL At your preoperative visit for you history and physical you were given the following medications: 1. An antibiotic: Start this medication when you get home and take according  to the instructions on the bottle. 2. Zofran 4 mg:  This is to treat nausea and vomiting.  You can take this every 6 hours as needed and only if needed. 3. Valium 2 mg: This is for muscle tightness if you have an implant or expander. This will help relax your muscle which also helps with pain control.  This can be taken every 12 hours as needed.  Don't drive after taking this medication. 4. Norco (hydrocodone/acetaminophen) 5/325 mg:  This is only to be used after you have taken the motrin or the tylenol. Every 8 hours as needed. Over the counter Medication to take: 5. Ibuprofen (Motrin) 600 mg:  Take this every 6 hours.  If you have additional pain then take 500 mg of the tylenol every 8 hours.  Only take the Norco after you have tried these two. 6. Miralax or stool softener of choice: Take this according to the bottle if you take the [place] Call your surgeon's office if any of the following occur:  Fever 101 degrees F or greater  Excessive bleeding or fluid from the incision site.  Pain that increases over time without aid from the medications  Redness, warmth, or pus draining from incision sites  Persistent nausea or inability to take in liquids  Severe misshapen area that underwent the operation.  [facility] Plastic Surgery Specialist  What is the benefit of having a drain?  During surgery your tissue layers are separated.  This raw surface stimulates your body to fill the space with serous fluid.  This is normal but you don't want that fluid to collect and prevent healing.  A fluid collection can also become infected.  The Jackson-Pratt (JP) drain is used to eliminate this collection of fluid and allow the tissue to heal together.    Jackson-Pratt (JP) bulb    How to care for your drainage and suction unit at home Your drainage catheter will be connected to a collection device. The vacuum caused when the device is compressed allows drainage to collect in the device.       Wash your hands with soap and water before and after touching the system.  Empty the JP drain every 12 hours once you get home from your procedure.  Record the fluid amount on the record sheet included.  Start with stripping the drain tube to push the clots or excess fluid to the bulb.  Do this by pinching the tube with one hand near your skin.  Then with the other hand squeeze the tubing and work it toward the bulb.  This should be done several times a day.  This may collapse the tube which will correct on its own.    Use a safety pin to attach your collection device  to your clothing so there is no tension on the insertion site.    If you have drainage at the skin insertion site, you can apply a gauze dressing and secure it with tape.  If the drain falls out, apply a gauze dressing over the drain insertion site and secure with tape.   To empty the collection device:    Release the stopper on the top of the collection unit (bulb).   Pour contents into a measuring container such as a plastic medicine cup.   Record the day and amount of drainage on the attached sheet.  This should be done at least twice a day.    To compress the Jackson-Pratt Bulb:   Release the stopper at the top of the bulb.  Squeeze the bulb tightly in your fist, squeezing air out of the bulb.   Replace the stopper while the bulb is compressed.   Be careful not to spill the contents when squeezing the bulb.  The drainage will start bright red and turn to pink and then yellow with time.  IMPORTANT: If the bulb is not squeezed before adding the stopper it will not draw out the fluid.  Care for the JP drain site and your skin daily:   You may shower three days after surgery.  Secure the drain to a ribbon or cloth around your waist while showering so it does not pull out while showering.  Be sure your hands are cleaned with soap and water.  Use a clean wet cotton swab to clean the skin around the drain  site.   Use another cotton swab to place Vaseline or antibiotic ointment on the skin around the drain.     Contact your physician if any of the following occur:   The fluid in the bulb becomes cloudy.  Your temperature is greater than 101.4.   The incision opens.  If you have drainage at the skin insertion site, you can apply a gauze dressing and secure it with tape.  If the drain falls out, apply a gauze dressing over the drain insertion site and secure with tape.   You will usually have more drainage when you are active than while you rest or are asleep. If the drainage increases significantly or is bloody call the physician                             Bring this record with you to each office visit Date  Drainage Volume  Date   Drainage volume

## 2020-02-07 NOTE — Anesthesia Procedure Notes (Addendum)
Procedure Name: Intubation Performed by: Justo Hengel H, CRNA Pre-anesthesia Checklist: Patient identified, Emergency Drugs available, Suction available and Patient being monitored Patient Re-evaluated:Patient Re-evaluated prior to induction Oxygen Delivery Method: Circle System Utilized Preoxygenation: Pre-oxygenation with 100% oxygen Induction Type: IV induction Ventilation: Mask ventilation without difficulty Laryngoscope Size: Mac and 3 Grade View: Grade I Tube type: Oral Tube size: 7.0 mm Number of attempts: 1 Airway Equipment and Method: Stylet Placement Confirmation: ETT inserted through vocal cords under direct vision,  positive ETCO2 and breath sounds checked- equal and bilateral Secured at: 22 cm Tube secured with: Tape Dental Injury: Teeth and Oropharynx as per pre-operative assessment        

## 2020-02-07 NOTE — Anesthesia Postprocedure Evaluation (Signed)
Anesthesia Post Note  Patient: Megan Vega  Procedure(s) Performed: LATISSIMUS MYOCUTANEOUS FLAP TO BREAST (Right Breast) REMOVAL PORT-A-CATH (Left Chest) BREAST RECONSTRUCTION (Right Breast)     Patient location during evaluation: PACU Anesthesia Type: General Level of consciousness: awake and alert Pain management: pain level controlled Vital Signs Assessment: post-procedure vital signs reviewed and stable Respiratory status: spontaneous breathing, nonlabored ventilation, respiratory function stable and patient connected to nasal cannula oxygen Cardiovascular status: blood pressure returned to baseline and stable Postop Assessment: no apparent nausea or vomiting Anesthetic complications: no    Last Vitals:  Vitals:   02/07/20 1207 02/07/20 1242  BP: (!) 141/93 132/88  Pulse: 69 68  Resp: 19 17  Temp: 36.4 C 36.6 C  SpO2: 97% 98%    Last Pain:  Vitals:   02/07/20 1242  TempSrc: Oral  PainSc: 0-No pain                 Vasily Fedewa P Destyn Schuyler

## 2020-02-08 ENCOUNTER — Encounter: Payer: Self-pay | Admitting: *Deleted

## 2020-02-08 LAB — SURGICAL PATHOLOGY

## 2020-02-08 LAB — HIV ANTIBODY (ROUTINE TESTING W REFLEX): HIV Screen 4th Generation wRfx: NONREACTIVE

## 2020-02-08 NOTE — Plan of Care (Signed)
  Problem: Education: Goal: Knowledge of General Education information will improve Description Including pain rating scale, medication(s)/side effects and non-pharmacologic comfort measures Outcome: Progressing   

## 2020-02-08 NOTE — Progress Notes (Signed)
Discharge instructions reviewed with patient. RN answered questions around drain care. Patient able to answer teach back questions on when to call the doctor, pain management, and f/u appointment. Printed AVS, specimen containers, and gauze given to patient. Patient discharged to home via wheelchair. Accompanied by daughter.

## 2020-02-08 NOTE — Discharge Summary (Signed)
Physician Discharge Summary  Patient ID: Megan Vega MRN: [id number] DOB/AGE: 1956-05-03 65 y.o.  Admit date: 02/07/2020 Discharge date: 02/08/2020  Admission Diagnoses: Acquired absence of right breast  Discharge Diagnoses:  Active Problems:   Acquired absence of right breast   Discharged Condition: good  Hospital Course: Megan Vega is a 63 year old female who underwent right breast reconstruction with latissimus myocutaneous flap to breast and removal of her Port-A-Cath yesterday with Dr. Marla Roe.  She had no overnight events.  This morning she is sitting up in bed.  Reports she feels great.  Reports she has been able to eat and drink as well as get up and use the bathroom on her own.  Reports pain is well controlled with just Tylenol and ibuprofen.  Visible incision sites are all C/D/I.  Mepilex border dressing covering incision on her back, no signs of drainage.  No signs of infection, seroma/hematoma, drainage from incisions.  Dark serosanguineous fluid present in all 3 drain bulbs.  Cap refill of latissimus flap ~3s.  Denies headache, chest pain, shortness of breath, N/V.  Consults: None  Significant Diagnostic Studies: none   Treatments: surgery: Latissimus myocutaneous flap to breast.  Discharge Exam: Blood pressure 108/62, pulse 71, temperature 98.3 F (36.8 C), temperature source Oral, resp. rate 17, height 5\' 5"  (1.651 m), weight 71.2 kg, SpO2 98 %. General appearance: alert, cooperative and no distress Head: Normocephalic, without obvious abnormality, atraumatic Eyes: EOMs intact. Pupils equal and round. Back: Incision (right) covered with mepilex boarder, clean and dry. No sings of redness, infection, drainage, seroma/hematoma. Both JP drains in place with dark red serosanguinous fluid in bulb. Resp: normal effort Chest wall: symmetric rise and fall Breasts: Right breast: incisions c/d/i. dermabond in place. Cap refill of latissimus flap ~ 3s. No signs of  infection, drainage, seroma/hematoma. ABDs and breast binder in place. ABD clean and dry. JP drain bulb has dark serosanguinous fluid present. Left side: Port incision c/d/u. no signs of infection, redness, drainage. ABD in place with very small amount of dried drainage visible.  Disposition: Discharge disposition: 01-Home or Self Care       Discharge Instructions    Call MD for:  difficulty breathing, headache or visual disturbances   Complete by: As directed    Call MD for:  extreme fatigue   Complete by: As directed    Call MD for:  hives   Complete by: As directed    Call MD for:  persistant dizziness or light-headedness   Complete by: As directed    Call MD for:  persistant nausea and vomiting   Complete by: As directed    Call MD for:  redness, tenderness, or signs of infection (pain, swelling, redness, odor or green/yellow discharge around incision site)   Complete by: As directed    Call MD for:  severe uncontrolled pain   Complete by: As directed    Call MD for:  temperature >100.4   Complete by: As directed    Diet - low sodium heart healthy   Complete by: As directed    Increase activity slowly   Complete by: As directed      Allergies as of 02/08/2020      Reactions   Peanut-containing Drug Products Swelling, Other (See Comments)   Only when eating too many peanuts, swelling with lips and tingly face      Medication List    TAKE these medications   acetaminophen 500 MG tablet Commonly known as: TYLENOL Take 1,000 mg  by mouth every 6 (six) hours as needed for moderate pain or headache.   b complex vitamins tablet Take 1 tablet by mouth daily.   CALCIUM 1200+D3 PO Take 1 tablet by mouth daily.   CLEAR EYES OP Place 1 drop into both eyes daily.   diazepam 2 MG tablet Commonly known as: Valium Take 1 tablet (2 mg total) by mouth every 12 (twelve) hours as needed for anxiety or muscle spasms.   gabapentin 100 MG capsule Commonly known as:  NEURONTIN TAKE 2 CAPSULES AT BEDTIME   letrozole 2.5 MG tablet Commonly known as: FEMARA TAKE 1 TABLET BY MOUTH EVERY DAY What changed: when to take this   loratadine 10 MG tablet Commonly known as: CLARITIN Take 10 mg by mouth every morning.   Melatonin 5 MG Caps Take 5 mg by mouth at bedtime.   multivitamin with minerals Tabs tablet Take 1 tablet by mouth daily. Centrum Silver   ondansetron 4 MG tablet Commonly known as: Zofran Take 1 tablet (4 mg total) by mouth every 8 (eight) hours as needed for nausea or vomiting.   polyethylene glycol 17 g packet Commonly known as: MIRALAX / GLYCOLAX Take 17 g by mouth daily as needed (for constipation.).   PROBIOTIC PO Take 1 capsule by mouth daily.      Follow-up Information    Dillingham, Loel Lofty, DO In 1 week.   Specialty: Plastic Surgery Contact information: [place] [id number] [phone]           Signed: Threasa Heads 02/08/2020, 8:08 AM

## 2020-02-10 ENCOUNTER — Telehealth: Payer: Self-pay | Admitting: *Deleted

## 2020-02-10 NOTE — Telephone Encounter (Signed)
T/C made to patient Megan Vega to follow up on her recent surgical procedure and determine how long she is to be off the IP. Patient states her plastic surgeon told her to stop the study drug on 01/31/2020 and she will remain off the Aspirin/placebo until she returns for her post-op follow up visit next week on 02/15/2020. [name] states she will receive further instructions from her surgeon at that time. Patient verified that she is noting this on her Study drug medication calendar. [name], BSN, MHA, OCN 02/10/2020 10:34 AM

## 2020-02-14 NOTE — Progress Notes (Signed)
Subjective:     Patient ID: Megan Vega, female    DOB: Jun 11, 1956, 64 y.o.   MRN: [id number]  Chief Complaint  Patient presents with  . Post-op Follow-up    for (R) breast reconstruction w/latissimus myocutaneous flap    HPI: The patient is a 64 y.o. female here for follow-up after right breast reconstruction with latissimus myocutaneous flap to breast and removal of her Port-A-Cath on 02/07/2020 with Dr. Marla Roe.   Patient reports she is doing very well. Pain well controlled. Denies F, CP, SOB, N/V. Incisions are healing well, c/d/i. No signs of infection or drainage. Mild swelling superior to incision on her back. Healing (yellow) bruising around the latissimus flap. Latissimus flap taking well. Cap refill ~3s. Three drains in place with serous fluid in the drain bulbs. Removed superior drain near back incision.   Review of Systems  Constitutional: Negative for chills and fever.  HENT: Negative for congestion, rhinorrhea and sore throat.   Respiratory: Negative for shortness of breath.   Cardiovascular: Negative for chest pain.  Gastrointestinal: Negative for constipation, diarrhea, nausea and vomiting.  Skin: Negative for pallor and rash.     Objective:   Vital Signs BP 115/72 (BP Location: Left Arm, Patient Position: Sitting, Cuff Size: Normal)   Pulse 88   Temp (!) 97.1 F (36.2 C) (Temporal)   Ht 5\' 5"  (1.651 m)   Wt 157 lb (71.2 kg)   SpO2 97%   BMI 26.13 kg/m  Vital Signs and Nursing Note Reviewed  Physical Exam  Constitutional: She is oriented to person, place, and time and well-developed, well-nourished, and in no distress.  HENT:  Head: Normocephalic and atraumatic.  Eyes: EOM are normal.  Cardiovascular: Normal rate.  Pulmonary/Chest: Effort normal.  Incisions healing well, c/d/i. No signs of infection or drainage. Dermabond present.  Mild swelling superior to back incision. Healing yellow bruising around the latissimus flap. Latissimus flap healing  well. Cap refill ~3s. Three drains in place with serous fluid in the bulbs.   Musculoskeletal:        General: Normal range of motion.     Cervical back: Normal range of motion.  Neurological: She is alert and oriented to person, place, and time. Gait normal.  Skin: Skin is warm and dry. No rash noted. No erythema. No pallor.  Psychiatric: Mood, memory, affect and judgment normal.      Assessment/Plan:     ICD-10-CM   1. S/P mastectomy, bilateral  Z90.13     Ms. Darrol Angel is doing very well today.  Incisions healing very nicely, C/D/I.  With Dermabond present.  Latissimus flap is taking well.  Healing bruising surrounding latissimus flap.  Cap refill approximately 3 seconds.  Back incision has mild swelling superior.  Removed superior back drain today.  Anticipate removal of the breast drain next week and possibly the remaining drain.  Continue wearing binder 24/7.  May shower, avoid direct water spray or washing on incisions, pat dry.  Continue to avoid vigorous activity or heavy lifting.  Continue tracking remaining drain output.  Follow-up next week.  Call office with any questions/concerns.  Pictures were obtained of the patient and placed in the chart with the patient's or guardian's permission.  The [place] was signed into law in 2016 which includes the topic of electronic health records.  This provides immediate access to information in MyChart.  This includes consultation notes, operative notes, office notes, lab results and pathology reports.  If you have any  questions about what you read please let us know at your next visit or call us at the office.  We are right here with you.   Threasa Heads, PA-C 02/15/2020, 12:44 PM

## 2020-02-15 ENCOUNTER — Encounter: Payer: Self-pay | Admitting: Plastic Surgery

## 2020-02-15 ENCOUNTER — Ambulatory Visit (INDEPENDENT_AMBULATORY_CARE_PROVIDER_SITE_OTHER): Payer: 59 | Admitting: Plastic Surgery

## 2020-02-15 ENCOUNTER — Other Ambulatory Visit: Payer: Self-pay

## 2020-02-15 VITALS — BP 115/72 | HR 88 | Temp 97.1°F | Ht 65.0 in | Wt 157.0 lb

## 2020-02-15 DIAGNOSIS — Z9013 Acquired absence of bilateral breasts and nipples: Secondary | ICD-10-CM

## 2020-02-17 NOTE — Progress Notes (Signed)
Megan Vega  [address] [place], [place] [address] Phone: [phone]  Fax: [phone]   Clinic Day:  02/21/2020  Referring physician: Burnard Hawthorne, FNP  Chief Complaint: Megan [facility]ega is a 64 y.o. female with IIIB right breast cancer and DCIS of the left breast who is seen for a 3 month assessment.   HPI: The patient was last seen in the medical oncology clinic on 11/25/2019 via telemedicine. At that time, she felt "good". She denied any breast concerns. She was undergoing reconstruction. Hematocrit was 40.4, hemoglobin 13.8, platelets 237,000, WBC 5,000.  CA 27.29 was 8.4. Patient continued on Femara. She was taking calcium and vitamin D.   She had a follow up with Dr. Baruch Gouty on 12/17/2019. She was doing well. Patient was in the midst of ongoing bilateral breast reconstruction. He turned patient's follow-up care over to the oncology.    She was seen by Dr. Hampton Abbot on 01/26/2020. She was doing well. She was due for surgery with Dr. Marla Roe on 02/07/2020 for right breast reconstruction with latissimus flap. She was very much looking forward to her surgery.  Patient wanted to undergo port-a-cath removal. Follow up was planned in 6 months.   Patient was admitted to [facility] from 02/07/2020 - 02/08/2020. Patient underwent right breast reconstruction with latissimus myocutaneous flap and removal of her port-a-cath by Dr. Marla Roe on 02/07/2020.  Estimated blood loss was 100 cc. She felt great s/p surgery. Pain was well controlled with just Tylenol and ibuprofen.  She had follow up care with Phoebe Sharps, PA in Dr Eusebio Friendly office on 02/15/2020. She denied any chest pain, shortness of breath, nausea or vomiting. She had no signs of infection or drainage. She had mild swelling superior to incision on her back.  There was healing (yellow) bruising around the latissimus flap. Latissimus flap was taking well. Capillary refill was approximately 3  seconds. Phoebe Sharps, PA-C instructed patient on care after surgery and planned for a follow up the following week.   Symptomatically, she has been feeling good. Patient is happy with her surgery and reports no issues. Her drains are still in place and she reports minimal drainage. Her lymphedema is better. She is wearing a compression sleeve and exercises. The neuropathy in her hands a feet are unchanged. She feels like the gabapentin helped at first but after a while it did not help as much even after the increase in dosage. She agreed to another increase in dosage of gabapentin. Patient remains on letrozole. She notes that on some mornings she feels achy and stiff.    Past Medical History:  Diagnosis Date  . Ankle fracture   . Cancer [facility])    Basal Cell Carcinoma, about 2011 chest  . Cataract   . Family history of adverse reaction to anesthesia    DAUGHTER-N/V  . Hemorrhoids   . History of methicillin resistant staphylococcus aureus (MRSA) 2015  . Migraines    MIGRAINES occ  . Personal history of chemotherapy   . Personal history of radiation therapy   . Wears glasses     Past Surgical History:  Procedure Laterality Date  . BREAST BIOPSY Bilateral 10/29/2017   L breast DCIS, R breast invasive mammary carcinoma of no special type, ER/PR+, Her2/neu 1+  . BREAST RECONSTRUCTION Right 02/07/2020   Procedure: BREAST RECONSTRUCTION;  Surgeon: Wallace Going, DO;  Location: [place];  Service: Plastics;  Laterality: Right;  . BREAST RECONSTRUCTION WITH PLACEMENT OF TISSUE EXPANDER AND FLEX HD (  ACELLULAR HYDRATED DERMIS) Bilateral 08/30/2019   Procedure: BREAST RECONSTRUCTION WITH PLACEMENT OF TISSUE EXPANDER AND FLEX HD (ACELLULAR HYDRATED DERMIS);  Surgeon: Wallace Going, DO;  Location: [facility];  Service: Plastics;  Laterality: Bilateral;  2.5 hours, please  . CESAREAN SECTION    . COLONOSCOPY  2012  . COLONOSCOPY WITH PROPOFOL N/A 12/01/2018   Procedure: COLONOSCOPY WITH  PROPOFOL;  Surgeon: Lucilla Lame, MD;  Location: [facility];  Service: Endoscopy;  Laterality: N/A;  . FRACTURE SURGERY     ankle  . IRRIGATION AND DEBRIDEMENT ABSCESS Right 03/12/2018   Procedure: IRRIGATION AND DEBRIDEMENT RIGHT AXILLARY ABSCESS;  Surgeon: Robert Bellow, MD;  Location: [facility];  Service: General;  Laterality: Right;  . LATISSIMUS FLAP TO BREAST Right 02/07/2020   Procedure: LATISSIMUS MYOCUTANEOUS FLAP TO BREAST;  Surgeon: Wallace Going, DO;  Location: [place];  Service: Plastics;  Laterality: Right;  3 hours  . MANDIBLE FRACTURE SURGERY    . MASTECTOMY    . MASTECTOMY MODIFIED RADICAL Right 07/10/2018   ypT2 ypN2a ER/PR positive, HER-2/neu negative  Surgeon: Robert Bellow, MD;  Location: [facility];  Service: General;  Laterality: Right;  . PORT-A-CATH REMOVAL Left 02/07/2020   Procedure: REMOVAL PORT-A-CATH;  Surgeon: Wallace Going, DO;  Location: [place];  Service: Plastics;  Laterality: Left;  . PORTACATH PLACEMENT Left 11/28/2017   Procedure: INSERTION PORT-A-CATH;  Surgeon: Robert Bellow, MD;  Location: [facility];  Service: General;  Laterality: Left;  . SIMPLE MASTECTOMY WITH AXILLARY SENTINEL NODE BIOPSY Left 07/10/2018   High-grade DCIS SIMPLE MASTECTOMY;  Surgeon: Robert Bellow, MD;  Location: [facility];  Service: General;  Laterality: Left;    Family History  Problem Relation Age of Onset  . Cancer Mother        Esophageal Cancer  . Early death Mother        Age 42  . Cancer Father        Lung Cancer  . Diabetes Father   . Diabetes Brother        Controlled with diet  . Heart attack Paternal Grandfather   . Colon cancer Neg Hx     Social History:  reports that she quit smoking about 43 years ago. She has a 2.50 pack-year smoking history. She has never used smokeless tobacco. She reports current alcohol use of about 1.0 standard drinks of alcohol per week. She reports that she does not use drugs. She lives in [place]. The  patient is alone today.  Allergies:  Allergies  Allergen Reactions  . Peanut-Containing Drug Products Swelling and Other (See Comments)    Only when eating too many peanuts, swelling with lips and tingly face    Current Medications: Current Outpatient Medications  Medication Sig Dispense Refill  . acetaminophen (TYLENOL) 500 MG tablet Take 1,000 mg by mouth every 6 (six) hours as needed for moderate pain or headache.    . b complex vitamins tablet Take 1 tablet by mouth daily.    . Calcium-Magnesium-Vitamin D (CALCIUM 1200+D3 PO) Take 1 tablet by mouth daily.    Marland Kitchen gabapentin (NEURONTIN) 100 MG capsule TAKE 2 CAPSULES AT BEDTIME (Patient taking differently: Take 200 mg by mouth at bedtime. ) 120 capsule 2  . letrozole (FEMARA) 2.5 MG tablet TAKE 1 TABLET BY MOUTH EVERY DAY (Patient taking differently: Take 2.5 mg by mouth at bedtime. ) 90 tablet 0  . loratadine (CLARITIN) 10 MG tablet Take 10 mg by mouth every morning.     Marland Kitchen  Melatonin 5 MG CAPS Take 5 mg by mouth at bedtime.    . Multiple Vitamin (MULTIVITAMIN WITH MINERALS) TABS tablet Take 1 tablet by mouth daily. Centrum Silver    . Naphazoline HCl (CLEAR EYES OP) Place 1 drop into both eyes daily.    . polyethylene glycol (MIRALAX / GLYCOLAX) packet Take 17 g by mouth daily as needed (for constipation.).     Marland Kitchen Probiotic Product (PROBIOTIC PO) Take 1 capsule by mouth daily.    . diazepam (VALIUM) 2 MG tablet Take 1 tablet (2 mg total) by mouth every 12 (twelve) hours as needed for anxiety or muscle spasms. (Patient not taking: Reported on 02/18/2020) 20 tablet 0  . ondansetron (ZOFRAN) 4 MG tablet Take 1 tablet (4 mg total) by mouth every 8 (eight) hours as needed for nausea or vomiting. (Patient not taking: Reported on 02/18/2020) 20 tablet 0   No current facility-administered medications for this visit.   Facility-Administered Medications Ordered in Other Visits  Medication Dose Route Frequency Provider Last Rate Last Admin  . heparin  lock flush 100 unit/mL  500 Units Intracatheter PRN Lequita Asal, MD        Review of Systems  Constitutional: Positive for weight loss (13 lbs since 08/23/2019). Negative for chills, diaphoresis, fever and malaise/fatigue.       Doing good.  HENT: Positive for hearing loss (right ear; using ear drops). Negative for congestion, ear pain, nosebleeds, sinus pain and sore throat.        "Swooshing" sound in right ear.  Eyes: Negative.  Negative for blurred vision and double vision.  Respiratory: Negative.  Negative for cough, hemoptysis, sputum production and shortness of breath.   Cardiovascular: Negative.  Negative for chest pain, palpitations, orthopnea and leg swelling.  Gastrointestinal: Negative.  Negative for abdominal pain, blood in stool, constipation, diarrhea, heartburn, melena, nausea and vomiting.  Genitourinary: Negative.  Negative for dysuria, frequency, hematuria and urgency.  Musculoskeletal: Negative for back pain, falls, joint pain, myalgias (muscle aches; intermittent) and neck pain.       Lymphedema- better. Stiff at times.  Skin: Negative.  Negative for rash.       Minimal drainage s/p reconstruction surgery.  Neurological: Positive for tingling (hands and feet) and sensory change (bottom of feet; hands). Negative for dizziness, tremors, speech change, focal weakness, weakness and headaches.  Endo/Heme/Allergies: Negative.  Does not bruise/bleed easily.  Psychiatric/Behavioral: Negative.  Negative for depression. The patient is not nervous/anxious.   All other systems reviewed and are negative.  Performance status (ECOG): 1  Vitals Blood pressure 115/73, pulse 81, temperature (!) 97 F (36.1 C), temperature source Tympanic, resp. rate 18, weight 156 lb 8.4 oz (71 kg), SpO2 99 %.   Physical Exam Vitals and nursing note reviewed.  Constitutional:      Appearance: She is well-developed and well-nourished.  HENT:     Head: Normocephalic and atraumatic.      Mouth/Throat:     Mouth: Oropharynx is clear and moist.     Pharynx: No oropharyngeal exudate.      Comments: Short gray hair. Mask. Eyes:     General: No scleral icterus.       Right eye: No discharge.        Left eye: No discharge.     Extraocular Movements: EOM normal.     Conjunctiva/sclera: Conjunctivae normal.     Pupils: Pupils are equal, round, and reactive to light.  Neck:     Vascular: No JVD.  Cardiovascular:     Rate and Rhythm: Normal rate and regular rhythm.     Heart sounds: Normal heart sounds. No murmur heard.   Pulmonary:     Effort: Pulmonary effort is normal. No respiratory distress.     Breath sounds: Normal breath sounds. No wheezing or rales.     Comments: Two drains in place on right.  Subtle right sided edema. Chest:     Chest wall: No tenderness.     Breasts:        Right: No inverted nipple, mass, nipple discharge, skin change or tenderness.        Left: No inverted nipple, mass, nipple discharge, skin change or tenderness.  Abdominal:     General: Bowel sounds are normal. There is no distension.     Palpations: Abdomen is soft. There is no mass.     Tenderness: There is no abdominal tenderness. There is no guarding or rebound.  Musculoskeletal:        General: No tenderness or edema. Normal range of motion.     Cervical back: Normal range of motion and neck supple.  Lymphadenopathy:     Head:     Right side of head: No preauricular, posterior auricular or occipital adenopathy.     Left side of head: No preauricular, posterior auricular or occipital adenopathy.     Cervical: No cervical adenopathy.     Upper Body:  No axillary adenopathy present.    Right upper body: No supraclavicular adenopathy.     Left upper body: No supraclavicular adenopathy.     Lower Body: No right inguinal adenopathy. No left inguinal adenopathy.  Skin:    General: Skin is warm and dry.  Neurological:     Mental Status: She is alert and oriented to person, place, and  time.  Psychiatric:        Mood and Affect: Mood and affect normal.        Behavior: Behavior normal.        Thought Content: Thought content normal.        Judgment: Judgment normal.    Appointment on 02/21/2020  Component Date Value Ref Range Status  . Sodium 02/21/2020 136  135 - 145 mmol/L Final  . Potassium 02/21/2020 4.4  3.5 - 5.1 mmol/L Final  . Chloride 02/21/2020 100  98 - 111 mmol/L Final  . CO2 02/21/2020 29  22 - 32 mmol/L Final  . Glucose, Bld 02/21/2020 102* 70 - 99 mg/dL Final   Glucose reference range applies only to samples taken after fasting for at least 8 hours.  . BUN 02/21/2020 17  8 - 23 mg/dL Final  . Creatinine, Ser 02/21/2020 0.99  0.44 - 1.00 mg/dL Final  . Calcium 02/21/2020 9.2  8.9 - 10.3 mg/dL Final  . Total Protein 02/21/2020 7.1  6.5 - 8.1 g/dL Final  . Albumin 02/21/2020 4.0  3.5 - 5.0 g/dL Final  . AST 02/21/2020 25  15 - 41 U/L Final  . ALT 02/21/2020 19  0 - 44 U/L Final  . Alkaline Phosphatase 02/21/2020 66  38 - 126 U/L Final  . Total Bilirubin 02/21/2020 0.5  0.3 - 1.2 mg/dL Final  . GFR calc non Af Amer 02/21/2020 >60  >60 mL/min Final  . GFR calc Af Amer 02/21/2020 >60  >60 mL/min Final  . Anion gap 02/21/2020 7  5 - 15 Final   Performed at [facility], [address], [place], [place] [address]  .  WBC 02/21/2020 6.5  4.0 - 10.5 K/uL Final  . RBC 02/21/2020 4.74  3.87 - 5.11 MIL/uL Final  . Hemoglobin 02/21/2020 13.6  12.0 - 15.0 g/dL Final  . HCT 02/21/2020 41.3  36.0 - 46.0 % Final  . MCV 02/21/2020 87.1  80.0 - 100.0 fL Final  . MCH 02/21/2020 28.7  26.0 - 34.0 pg Final  . MCHC 02/21/2020 32.9  30.0 - 36.0 g/dL Final  . RDW 02/21/2020 12.8  11.5 - 15.5 % Final  . Platelets 02/21/2020 277  150 - 400 K/uL Final  . nRBC 02/21/2020 0.0  0.0 - 0.2 % Final  . Neutrophils Relative % 02/21/2020 67  % Final  . Neutro Abs 02/21/2020 4.4  1.7 - 7.7 K/uL Final  . Lymphocytes Relative 02/21/2020 23  % Final  . Lymphs Abs  02/21/2020 1.5  0.7 - 4.0 K/uL Final  . Monocytes Relative 02/21/2020 7  % Final  . Monocytes Absolute 02/21/2020 0.5  0.1 - 1.0 K/uL Final  . Eosinophils Relative 02/21/2020 1  % Final  . Eosinophils Absolute 02/21/2020 0.1  0.0 - 0.5 K/uL Final  . Basophils Relative 02/21/2020 1  % Final  . Basophils Absolute 02/21/2020 0.0  0.0 - 0.1 K/uL Final  . Immature Granulocytes 02/21/2020 1  % Final  . Abs Immature Granulocytes 02/21/2020 0.04  0.00 - 0.07 K/uL Final   Performed at [facility], [address], [place], [place] [address]    Assessment:  Shlonda Dolloff is a 64 y.o. female with clinical stage T2N3bM0 right breast cancerand DCIS in the left breasts/p neoadjuvant chemotherapyfollowed by left simple mastectomy and right modified radical mastectomyon 07/10/2018. Left breast pathologyrevealed 2.8 cm residual high grade DCIS, comedo type. There was atypical lobular hyperplasia. Four sentinel lymph nodes were negative. Right breast pathologyrevealed 3.6 cm residual grade II invasive mammary carcinoma and high grade DCIS. There was lymphovascular invasion.There was metastatic carcinoma in 2 sentinel lymph nodes. Right axillary dissection revealed metastatic carcinoma in 7 lymph nodes. There were a total of 9 lymph nodes with macrometastasis (largest 22 mm). Pathologic stagewas ypT2 ypN2a.  She underwent ultrasound guided biopsyon 10/29/2017. Pathology from the right breastat the 10 o'clock position 3 cm from the nipple revealed a 1.4 cm grade II invasive mammary carcinoma of no special type. Tumor was ER + (>90%), PR + (90%) and Her2/neu 1+. Right axillary node biopsyrevealed metastatic carcinoma. Left breast biopsyat the 2 o'clock position 3 cm from the nipple revealed high grade DCIS with comedonecrosis.   She received 4 cycles of neoadjuvantAC (12/05/2017 - 01/30/2018) with OnPro Neulasta support. She received 12 weeks ofneoadjuvant Taxol(02/13/2018 -  02/27/2018; 04/10/2018 - 06/05/2018). She received radiationfrom 09/23/2018 - 11/23/2018.She began letrozoleon02/19/2020.  Patient underwent bilateral breast reconstruction with placement of Acellular Dermal Matrix and tissue expanders on 08/30/2019.  She underwent right breast reconstruction with latissimus myocutaneous flap and removal of her port-a-cath on 02/07/2020.   CA27.29has been followed: 23.5 on 11/14/2017,<3.5 on 08/27/2018, 7.4 on 01/06/2019, 9.2 on 05/24/2019, and 11.9 on 08/23/2019.  Bone densityon 12/16/2016 revealed osteopeniawith a T-score of -1.6 in the LEFTfemoral neck on 12/16/2017. Bone densityon 11/24/2018 revealed osteopenia with a T-score of -2.1 in the LEFTfemoral neck.  She developed a right axillary abscessand underwent incision and drainage on 03/12/2018. Culture grew staph aureus. She completed Bactrim on 03/26/2018.  She underwent right breast reconstruction with latissimus myocutaneous flap and removal of her port-a-cath on 02/07/2020.   Symptomatically, she is recovering well from surgery.  Exam reveals post-operative changes.  Drains remain in place.  Plan: 1.   Labs today: CBC with diff, CMP, CA 27.29. 2.Stage IIIBRIGHTbreast cancer Clinically, she is doing well.             She is s/pbilateral mastectomy.              She underwent right breast reconstruction with latissimus myocutaneous flap on 02/07/2020   She is healing well from surgery. Continue Femara. 3.Chemotherapy induced neuropathy Neuropathy in her hands and feet are unchanged. Is currently taking Neurontin 200 mg a day. Increase Neurontin to 200 mg twice a day with plans to further increase dose if needed. B12was normal on 05/24/2019. Continue to monitor 4.Osteopenia Bone density on01/2020revealed a T-score of -2.1. Patient takes calcium and vitamin D. Patient receives Zometa twice yearly (last 08/23/2019). Labs reviewed.   Zometa today. 5. Lymphedema Continue management.  6.   Port-A-Cath              Port removed on 02/07/2020. 7.   RTC in 4 months for MD assess and labs (CBC with diff, CMP, CA27.29). 8.   RTC in 6 months for labs (BMP) and Zometa.  I discussed the assessment and treatment plan with the patient.  The patient was provided an opportunity to ask questions and all were answered.  The patient agreed with the plan and demonstrated an understanding of the instructions.  The patient was advised to call back if the symptoms worsen or if the condition fails to improve as anticipated.  I provided 15 minutes of face-to-face time during this encounter and > 50% was spent counseling as documented under my assessment and plan. An additional 8-10 minutes were spent reviewing her chart (Epic and Care Everywhere) including notes, labs, and imaging studies.    Lequita Asal, MD, PhD    02/21/2020, 2:08 PM  I, Selena Batten, am acting as scribe for [employer]. Mike Gip, MD, PhD.  I, Melissa C. Mike Gip, MD, have reviewed the above documentation for accuracy and completeness, and I agree with the above.

## 2020-02-18 ENCOUNTER — Other Ambulatory Visit: Payer: Self-pay

## 2020-02-18 ENCOUNTER — Encounter: Payer: Self-pay | Admitting: Hematology and Oncology

## 2020-02-18 NOTE — Progress Notes (Signed)
No new changes noted today. The patient name and DOB has been verified by phone today. 

## 2020-02-21 ENCOUNTER — Inpatient Hospital Stay: Payer: 59

## 2020-02-21 ENCOUNTER — Encounter: Payer: Self-pay | Admitting: Hematology and Oncology

## 2020-02-21 ENCOUNTER — Other Ambulatory Visit: Payer: Self-pay

## 2020-02-21 ENCOUNTER — Inpatient Hospital Stay: Payer: 59 | Attending: Hematology and Oncology | Admitting: Hematology and Oncology

## 2020-02-21 VITALS — BP 119/80 | HR 70 | Temp 98.1°F | Resp 17

## 2020-02-21 VITALS — BP 115/73 | HR 81 | Temp 97.0°F | Resp 18 | Wt 156.5 lb

## 2020-02-21 DIAGNOSIS — Z170 Estrogen receptor positive status [ER+]: Secondary | ICD-10-CM | POA: Insufficient documentation

## 2020-02-21 DIAGNOSIS — C50411 Malignant neoplasm of upper-outer quadrant of right female breast: Secondary | ICD-10-CM | POA: Insufficient documentation

## 2020-02-21 DIAGNOSIS — D0512 Intraductal carcinoma in situ of left breast: Secondary | ICD-10-CM

## 2020-02-21 DIAGNOSIS — G620 Drug-induced polyneuropathy: Secondary | ICD-10-CM | POA: Diagnosis not present

## 2020-02-21 DIAGNOSIS — T451X5A Adverse effect of antineoplastic and immunosuppressive drugs, initial encounter: Secondary | ICD-10-CM

## 2020-02-21 DIAGNOSIS — D701 Agranulocytosis secondary to cancer chemotherapy: Secondary | ICD-10-CM

## 2020-02-21 DIAGNOSIS — M85852 Other specified disorders of bone density and structure, left thigh: Secondary | ICD-10-CM | POA: Insufficient documentation

## 2020-02-21 DIAGNOSIS — Z79899 Other long term (current) drug therapy: Secondary | ICD-10-CM | POA: Diagnosis not present

## 2020-02-21 DIAGNOSIS — I890 Lymphedema, not elsewhere classified: Secondary | ICD-10-CM

## 2020-02-21 LAB — COMPREHENSIVE METABOLIC PANEL
ALT: 19 U/L (ref 0–44)
AST: 25 U/L (ref 15–41)
Albumin: 4 g/dL (ref 3.5–5.0)
Alkaline Phosphatase: 66 U/L (ref 38–126)
Anion gap: 7 (ref 5–15)
BUN: 17 mg/dL (ref 8–23)
CO2: 29 mmol/L (ref 22–32)
Calcium: 9.2 mg/dL (ref 8.9–10.3)
Chloride: 100 mmol/L (ref 98–111)
Creatinine, Ser: 0.99 mg/dL (ref 0.44–1.00)
GFR calc Af Amer: >60 mL/min (ref >60)
GFR calc non Af Amer: >60 mL/min (ref >60)
Glucose, Bld: 102 mg/dL — ABNORMAL HIGH (ref 70–99)
Potassium: 4.4 mmol/L (ref 3.5–5.1)
Sodium: 136 mmol/L (ref 135–145)
Total Bilirubin: 0.5 mg/dL (ref 0.3–1.2)
Total Protein: 7.1 g/dL (ref 6.5–8.1)

## 2020-02-21 LAB — CBC WITH DIFFERENTIAL/PLATELET
Abs Immature Granulocytes: 0.04 10*3/uL (ref 0.00–0.07)
Basophils Absolute: 0 10*3/uL (ref 0.0–0.1)
Basophils Relative: 1 %
Eosinophils Absolute: 0.1 10*3/uL (ref 0.0–0.5)
Eosinophils Relative: 1 %
HCT: 41.3 % (ref 36.0–46.0)
Hemoglobin: 13.6 g/dL (ref 12.0–15.0)
Immature Granulocytes: 1 %
Lymphocytes Relative: 23 %
Lymphs Abs: 1.5 10*3/uL (ref 0.7–4.0)
MCH: 28.7 pg (ref 26.0–34.0)
MCHC: 32.9 g/dL (ref 30.0–36.0)
MCV: 87.1 fL (ref 80.0–100.0)
Monocytes Absolute: 0.5 10*3/uL (ref 0.1–1.0)
Monocytes Relative: 7 %
Neutro Abs: 4.4 10*3/uL (ref 1.7–7.7)
Neutrophils Relative %: 67 %
Platelets: 277 10*3/uL (ref 150–400)
RBC: 4.74 MIL/uL (ref 3.87–5.11)
RDW: 12.8 % (ref 11.5–15.5)
WBC: 6.5 10*3/uL (ref 4.0–10.5)
nRBC: 0 % (ref 0.0–0.2)

## 2020-02-21 MED ORDER — SODIUM CHLORIDE 0.9 % IV SOLN
Freq: Once | INTRAVENOUS | Status: AC
  Filled 2020-02-21: qty 250

## 2020-02-21 MED ORDER — ZOLEDRONIC ACID 4 MG/100ML IV SOLN
4.0000 mg | Freq: Once | INTRAVENOUS | Status: AC
  Administered 2020-02-21: 4 mg via INTRAVENOUS
  Filled 2020-02-21: qty 100

## 2020-02-21 NOTE — Patient Instructions (Signed)
  Increase gabapentin to 200 mg twice a day.

## 2020-02-21 NOTE — Progress Notes (Signed)
Patient had breast reconstruction surgery and port removal on 02/07/20 and is doing well. Drains in place.

## 2020-02-21 NOTE — Patient Instructions (Signed)
Zoledronic Acid injection (Hypercalcemia, Oncology) What is this medicine? ZOLEDRONIC ACID (ZOE le dron ik AS id) lowers the amount of calcium loss from bone. It is used to treat too much calcium in your blood from cancer. It is also used to prevent complications of cancer that has spread to the bone. This medicine may be used for other purposes; ask your health care provider or pharmacist if you have questions. COMMON BRAND NAME(S): Zometa What should I tell my health care provider before I take this medicine? They need to know if you have any of these conditions:  aspirin-sensitive asthma  cancer, especially if you are receiving medicines used to treat cancer  dental disease or wear dentures  infection  kidney disease  receiving corticosteroids like dexamethasone or prednisone  an unusual or allergic reaction to zoledronic acid, other medicines, foods, dyes, or preservatives  pregnant or trying to get pregnant  breast-feeding How should I use this medicine? This medicine is for infusion into a vein. It is given by a health care professional in a hospital or clinic setting. Talk to your pediatrician regarding the use of this medicine in children. Special care may be needed. Overdosage: If you think you have taken too much of this medicine contact a poison control center or emergency room at once. NOTE: This medicine is only for you. Do not share this medicine with others. What if I miss a dose? It is important not to miss your dose. Call your doctor or health care professional if you are unable to keep an appointment. What may interact with this medicine?  certain antibiotics given by injection  NSAIDs, medicines for pain and inflammation, like ibuprofen or naproxen  some diuretics like bumetanide, furosemide  teriparatide  thalidomide This list may not describe all possible interactions. Give your health care provider a list of all the medicines, herbs, non-prescription  drugs, or dietary supplements you use. Also tell them if you smoke, drink alcohol, or use illegal drugs. Some items may interact with your medicine. What should I watch for while using this medicine? Visit your doctor or health care professional for regular checkups. It may be some time before you see the benefit from this medicine. Do not stop taking your medicine unless your doctor tells you to. Your doctor may order blood tests or other tests to see how you are doing. Women should inform their doctor if they wish to become pregnant or think they might be pregnant. There is a potential for serious side effects to an unborn child. Talk to your health care professional or pharmacist for more information. You should make sure that you get enough calcium and vitamin D while you are taking this medicine. Discuss the foods you eat and the vitamins you take with your health care professional. Some people who take this medicine have severe bone, joint, and/or muscle pain. This medicine may also increase your risk for jaw problems or a broken thigh bone. Tell your doctor right away if you have severe pain in your jaw, bones, joints, or muscles. Tell your doctor if you have any pain that does not go away or that gets worse. Tell your dentist and dental surgeon that you are taking this medicine. You should not have major dental surgery while on this medicine. See your dentist to have a dental exam and fix any dental problems before starting this medicine. Take good care of your teeth while on this medicine. Make sure you see your dentist for regular follow-up   appointments. What side effects may I notice from receiving this medicine? Side effects that you should report to your doctor or health care professional as soon as possible:  allergic reactions like skin rash, itching or hives, swelling of the face, lips, or tongue  anxiety, confusion, or depression  breathing problems  changes in vision  eye  pain  feeling faint or lightheaded, falls  jaw pain, especially after dental work  mouth sores  muscle cramps, stiffness, or weakness  redness, blistering, peeling or loosening of the skin, including inside the mouth  trouble passing urine or change in the amount of urine Side effects that usually do not require medical attention (report to your doctor or health care professional if they continue or are bothersome):  bone, joint, or muscle pain  constipation  diarrhea  fever  hair loss  irritation at site where injected  loss of appetite  nausea, vomiting  stomach upset  trouble sleeping  trouble swallowing  weak or tired This list may not describe all possible side effects. Call your doctor for medical advice about side effects. You may report side effects to FDA at 1-800-FDA-1088. Where should I keep my medicine? This drug is given in a hospital or clinic and will not be stored at home. NOTE: This sheet is a summary. It may not cover all possible information. If you have questions about this medicine, talk to your doctor, pharmacist, or health care provider.  2020 Elsevier/Gold Standard (2014-04-02 14:19:39)  

## 2020-02-22 ENCOUNTER — Encounter: Payer: Self-pay | Admitting: Surgical

## 2020-02-22 ENCOUNTER — Ambulatory Visit (INDEPENDENT_AMBULATORY_CARE_PROVIDER_SITE_OTHER): Payer: 59 | Admitting: Surgical

## 2020-02-22 VITALS — BP 131/84 | HR 67 | Temp 98.4°F | Ht 65.0 in | Wt 157.0 lb

## 2020-02-22 DIAGNOSIS — D0512 Intraductal carcinoma in situ of left breast: Secondary | ICD-10-CM

## 2020-02-22 DIAGNOSIS — Z9013 Acquired absence of bilateral breasts and nipples: Secondary | ICD-10-CM

## 2020-02-22 LAB — CANCER ANTIGEN 27.29: CA 27.29: 11.2 U/mL (ref 0.0–38.6)

## 2020-02-22 NOTE — Progress Notes (Signed)
   Subjective:     Patient ID: Megan [facility], female    DOB: 1956-11-06, 64 y.o.   MRN: [id number]  Chief Complaint  [name]    HPI: The patient is a 64 y.o. female here for follow-up after right breast reconstruction with latissimus myocutaneous flap and removal of left Port-A-Cath on 02/07/2020 with Dr. Marla Roe.  She is doing really well today.  She reports that pain has been easily controlled with ibuprofen and Tylenol.  She has no complaints other than some itching.  Bruising has resolved.  She does have 2 drains in place, right breast JP drain with ~1 cc of output over the last 24 hours.  Inferior latissimus JP drain with approximately 30 cc of serous output over the last 24 hours.  Latissimus flap with good color, good cap refill, incision is C/D/I, healing nicely.  She does not have any nausea vomiting fevers chills.  Review of Systems  Constitutional: Positive for activity change. Negative for chills, fatigue and fever.  Respiratory: Negative.   Cardiovascular: Negative.   Gastrointestinal: Negative.   Skin: Negative.      Objective:   Vital Signs BP 131/84 (BP Location: Left Arm, Patient Position: Sitting, Cuff Size: Normal)   Pulse 67   Temp 98.4 F (36.9 C) (Temporal)   Ht 5\' 5"  (1.651 m)   Wt 157 lb (71.2 kg)   SpO2 93%   BMI 26.13 kg/m  Vital Signs and Nursing Note Reviewed Chaperone present Physical Exam  Constitutional: She is oriented to person, place, and time and well-developed, well-nourished, and in no distress. No distress.  HENT:  Head: Normocephalic and atraumatic.  Cardiovascular: Intact distal pulses.  Pulmonary/Chest: Effort normal.        Musculoskeletal:        General: No edema. Normal range of motion.  Neurological: She is alert and oriented to person, place, and time. Gait normal.  Skin: Skin is warm and dry. She is not diaphoretic. No erythema.      Assessment/Plan:     ICD-10-CM   1. S/P  mastectomy, bilateral  Z90.13   2. Ductal carcinoma in situ (DCIS) of left breast  D05.12    Removed right breast JP drain.  Inferior latissimus JP drain in place due to output of approximately 30 cc per 24 hours.  Did not want to remove both drains today due to possibility of inferior JP drain increasing with removal of right breast JP drain.  Incisions are healing nicely.  She has good cap refill of latissimus flap.  We did not fill today to allow latissimus flap to heal for 1 more week, plan to fill in 1 week.  No sign of any infection, seroma, hematoma.  We DID NOT place injectable saline in the Expander today: Right: 0 cc for a total of 180 / 375 cc Left: 0 cc for a total of 360 / 375 cc   Recommend calling with any questions or concerns.   Carola Rhine Ehtan Delfavero, PA-C 02/22/2020, 10:31 AM

## 2020-02-28 ENCOUNTER — Other Ambulatory Visit: Payer: Self-pay

## 2020-02-28 ENCOUNTER — Encounter: Payer: Self-pay | Admitting: Plastic Surgery

## 2020-02-28 ENCOUNTER — Ambulatory Visit (INDEPENDENT_AMBULATORY_CARE_PROVIDER_SITE_OTHER): Payer: 59 | Admitting: Plastic Surgery

## 2020-02-28 VITALS — BP 110/72 | HR 76 | Temp 97.5°F | Ht 65.0 in | Wt 157.0 lb

## 2020-02-28 DIAGNOSIS — Z9013 Acquired absence of bilateral breasts and nipples: Secondary | ICD-10-CM

## 2020-02-28 DIAGNOSIS — Z9011 Acquired absence of right breast and nipple: Secondary | ICD-10-CM

## 2020-02-28 NOTE — Progress Notes (Signed)
   Subjective:    Patient ID: [facility], female    DOB: 1956/04/07, 64 y.o.   MRN: [id number]  The patient is a 64 year old female here for follow-up on her bilateral breast reconstruction.  The port site where the Port-A-Cath was removed is doing well.  I put a Steri-Strip on that.  The latissimus area on the right is looking good.  There is a little bit of tightness laterally.  This is normal there is no sign of seroma or hematoma.  The drain output is getting much better.  The capillary refill looks really good on the flap.  Patient does not have any complaints.     Review of Systems  Constitutional: Negative.   HENT: Negative.   Eyes: Negative.   Respiratory: Negative.   Cardiovascular: Negative.   Gastrointestinal: Negative.   Genitourinary: Negative.   Musculoskeletal: Negative.        Objective:   Physical Exam Vitals and nursing note reviewed.  Constitutional:      Appearance: Normal appearance.  HENT:     Head: Normocephalic and atraumatic.  Cardiovascular:     Rate and Rhythm: Normal rate.     Pulses: Normal pulses.  Neurological:     General: No focal deficit present.     Mental Status: She is alert and oriented to person, place, and time.  Psychiatric:        Mood and Affect: Mood normal.        Behavior: Behavior normal.        Thought Content: Thought content normal.           Assessment & Plan:     ICD-10-CM   1. Acquired absence of right breast  Z90.11   2. S/P mastectomy, bilateral  Z90.13     We placed injectable saline in the Expander using a sterile technique: Right: 50 cc for a total of 230 / 375 cc Left: 0 cc for a total of 360 / 375 cc  Plan to remove drain later this week.

## 2020-02-29 ENCOUNTER — Encounter: Payer: 59 | Admitting: Plastic Surgery

## 2020-02-29 ENCOUNTER — Ambulatory Visit: Payer: 59 | Admitting: Surgical

## 2020-03-01 NOTE — Progress Notes (Signed)
   Subjective:     Patient ID: Megan Vega, female    DOB: 1956-09-19, 64 y.o.   MRN: [id number]  Chief Complaint  Patient presents with  . Breast Cancer    HPI: The patient is a 64 y.o. female here for follow-up right breast reconstruction with latissimus myocutaneous flap to breast and removal of her Port-A-Cath on 02/07/2020 with Dr. Marla Roe.  Today she reports she is doing very well.  No issues with her fell from Monday.  Denies fever, chest pain, shortness of breath, N/V.  Reports drain output has been approximately 10 cc for the last several days.  Latissimus flap incisions healing nicely, C/D/I.  Good cap refill of flap.  Back incision is healing very nicely, C/D/I.  No signs of redness, drainage, seroma/hematoma, infection.  Mild irritation erythema around the drain site, no signs of infection.  Review of Systems All pertinent positives and negatives as stated in HPI.  Objective:   Vital Signs BP 119/77 (BP Location: Left Arm, Patient Position: Sitting, Cuff Size: Normal)   Pulse 73   Temp 98.4 F (36.9 C) (Temporal)   Ht 5\' 5"  (1.651 m)   Wt 157 lb (71.2 kg)   SpO2 94%   BMI 26.13 kg/m  Vital Signs and Nursing Note Reviewed  Physical Exam  Constitutional: She is oriented to person, place, and time and well-developed, well-nourished, and in no distress.  HENT:  Head: Normocephalic and atraumatic.  Eyes: EOM are normal.  Pulmonary/Chest: Effort normal.  Musculoskeletal:        General: Normal range of motion.     Cervical back: Normal range of motion.  Neurological: She is alert and oriented to person, place, and time. Gait normal.  Skin: Skin is warm and dry. No rash noted. No erythema (mild irritation around drain site, no sign of infection). No pallor.  Psychiatric: Mood, memory, affect and judgment normal.      Assessment/Plan:     ICD-10-CM   1. S/P breast reconstruction  Z98.890     Remove final drain today.  Do daily dressing changes Vaseline and  gauze for the next few days until drain site is closed.  We did NOT place injectable saline in the Expander using a sterile technique today since last fill was 02/28/20. Right: 0 cc for a total of 230 / 375 cc Left: 0 cc for a total of 360 / 375 cc  Follow-up in 2 weeks for additional fill.  Call office with any questions/concerns or if condition worsens.  The [place] was signed into law in 2016 which includes the topic of electronic health records.  This provides immediate access to information in MyChart.  This includes consultation notes, operative notes, office notes, lab results and pathology reports.  If you have any questions about what you read please let us know at your next visit or call us at the office.  We are right here with you.   Threasa Heads, PA-C 03/02/2020, 1:58 PM

## 2020-03-02 ENCOUNTER — Ambulatory Visit (INDEPENDENT_AMBULATORY_CARE_PROVIDER_SITE_OTHER): Payer: 59 | Admitting: Plastic Surgery

## 2020-03-02 ENCOUNTER — Encounter: Payer: Self-pay | Admitting: Plastic Surgery

## 2020-03-02 ENCOUNTER — Other Ambulatory Visit: Payer: Self-pay

## 2020-03-02 DIAGNOSIS — Z9889 Other specified postprocedural states: Secondary | ICD-10-CM

## 2020-03-08 ENCOUNTER — Encounter: Payer: Self-pay | Admitting: Hematology and Oncology

## 2020-03-10 ENCOUNTER — Telehealth: Payer: Self-pay

## 2020-03-10 MED ORDER — INV-ASPIRIN/PLACEBO 300 MG TABS ALLIANCE A011502: 1.0000 | ORAL_TABLET | Freq: Every day | ORAL | 0 refills | Status: DC

## 2020-03-10 NOTE — Telephone Encounter (Signed)
Returned patients call. LMVM that unlikely the fever is coming from her surgery a month ago. Suggested for her to call her PC. Reminded her that follow up is next Friday, April the 29th. If she feels she needs to come in sooner, call back to the office and we will scheduler her earlier next week.

## 2020-03-10 NOTE — Telephone Encounter (Signed)
Received voicemail from patient stating she has had a fever everyday since Tuesday. She recently got tested for Covid and it was negative. She is wondering if the fever could be anything to do with the surgery she had on 02/07/2020.

## 2020-03-10 NOTE — Telephone Encounter (Signed)
Spoke with patient.  Reports fever & chills have resolved. Denies redness, pain, warmth, or swelling around her incisions or healed drain sites. Denies N/V, SOB, CP. Recommend she keep her appointment next week.  If she starts feeling bad again or she starts developing signs of infection around incisions/drain sites to call us. She may also want to consider following up with her PCP to evaluate other sources of her fevers this week as she reports some sinus issues due to the pollen.

## 2020-03-14 NOTE — Progress Notes (Signed)
Patient is a 64 year old female here for follow-up after undergoing right breast reconstruction with latissimus myocutaneous flap and removal of her Port-A-Cath on 02/07/2020 with Dr. Marla Roe.  5 weeks PO. Patient reports she has been running a fever for the last 2 weeks (99.8 - 102.6 F). Had a covid test - negative. Called for appointment with her PCP, but they won't see a patient with a fever. Denies congestion, sneezing, sore throat, cough, SOB, N/V, sinus pressure.   Lungs are clear bilaterally, no wheezing or consolidation. All incisions are healing well, c/d/i. No drainage. Patient has fluid collection near her back incision and right axilla region.  Mild redness on superior right breast.  65 cc of serosanguinous fluid aspirated from right lateral back. There is still visible fluid present, but unable to aspirate any additional.  We did NOT place injectable saline in the Expander today. Right: 0 cc for a total of 230 / 375 cc Left: 0 cc for a total of 360 / 375 cc   Sent 5 days RX of doxy to pharmacy. Follow up in 1 week for recheck.  Call office with any questions/concerns or if condition worsens.  The [place] was signed into law in 2016 which includes the topic of electronic health records.  This provides immediate access to information in MyChart.  This includes consultation notes, operative notes, office notes, lab results and pathology reports.  If you have any questions about what you read please let us know at your next visit or call us at the office.  We are right here with you.

## 2020-03-16 ENCOUNTER — Other Ambulatory Visit: Payer: Self-pay

## 2020-03-16 ENCOUNTER — Encounter: Payer: Self-pay | Admitting: Plastic Surgery

## 2020-03-16 ENCOUNTER — Ambulatory Visit (INDEPENDENT_AMBULATORY_CARE_PROVIDER_SITE_OTHER): Payer: 59 | Admitting: Plastic Surgery

## 2020-03-16 VITALS — BP 117/75 | HR 104 | Temp 97.4°F | Ht 65.0 in | Wt 156.0 lb

## 2020-03-16 DIAGNOSIS — Z9889 Other specified postprocedural states: Secondary | ICD-10-CM

## 2020-03-16 MED ORDER — DOXYCYCLINE HYCLATE 100 MG PO TABS: 100.0000 mg | ORAL_TABLET | Freq: Two times a day (BID) | ORAL | 0 refills | Status: AC

## 2020-03-20 ENCOUNTER — Other Ambulatory Visit: Payer: Self-pay | Admitting: Oncology

## 2020-03-20 DIAGNOSIS — T451X5A Adverse effect of antineoplastic and immunosuppressive drugs, initial encounter: Secondary | ICD-10-CM

## 2020-03-20 DIAGNOSIS — G620 Drug-induced polyneuropathy: Secondary | ICD-10-CM

## 2020-03-21 NOTE — Progress Notes (Signed)
Patient is a 64 year old female here for follow-up after undergoing right breast reconstruction with latissimus myocutaneous flap and removal of her Port-A-Cath on 02/07/2020 with Dr. Marla Roe.  At her visit on 4/29 she had been running a fever for the last 2 weeks and had some fluid collection near her back incision and right axillary region with mild redness on her superior right breast and 65 cc of serosanguineous fluid was aspirated from her right lateral back.  There was still visible fluid present, but unable to aspirate any additional fluid.  Was placed on 5 days of doxycycline.  ~6 weeks PO. Today she reports her fevers have resolved.  Some swelling present near her back incision, but no palpable fluid collection. Redness of right breast has improved; mild redness on medial aspect of right breast remains. No warmth or drainage. Reports mild pain along ribs of inferior aspect of breast when sitting up from a reclined position.    We did not place injectable saline in the Expander today: Right: 0 cc for a total of 230 / 375 cc Left: 0 cc for a total of 360 / 375 cc Patient would still like to be a little larger if possible.   Follow up in 2 weeks for additional fill. Call office with any questions/concerns or condition worsens (Swelling or redness increases, fevers return, pain increases, breast becomes warm to the touch).  The [place] was signed into law in 2016 which includes the topic of electronic health records.  This provides immediate access to information in MyChart.  This includes consultation notes, operative notes, office notes, lab results and pathology reports.  If you have any questions about what you read please let us know at your next visit or call us at the office.  We are right here with you.

## 2020-03-23 ENCOUNTER — Other Ambulatory Visit: Payer: Self-pay

## 2020-03-23 ENCOUNTER — Other Ambulatory Visit: Payer: Self-pay | Admitting: Hematology and Oncology

## 2020-03-23 ENCOUNTER — Encounter: Payer: Self-pay | Admitting: Plastic Surgery

## 2020-03-23 ENCOUNTER — Ambulatory Visit (INDEPENDENT_AMBULATORY_CARE_PROVIDER_SITE_OTHER): Payer: 59 | Admitting: Plastic Surgery

## 2020-03-23 VITALS — BP 125/81 | HR 103 | Temp 98.2°F

## 2020-03-23 DIAGNOSIS — Z9013 Acquired absence of bilateral breasts and nipples: Secondary | ICD-10-CM

## 2020-03-23 DIAGNOSIS — C50411 Malignant neoplasm of upper-outer quadrant of right female breast: Secondary | ICD-10-CM

## 2020-04-03 NOTE — Progress Notes (Signed)
Patient is a 64 year old female here for follow-up after undergoing right breast reconstruction with latissimus myocutaneous flap on 02/07/2020 with Dr. Marla Roe.   At her 4/29 visit she been running fevers for the last 2 weeks and had some fluid collection near her back incision and right axillary region with mild redness on her superior right breast.  65 cc of serosanguineous fluid was aspirated from her right lateral back; there was still visible fluid present but unable to aspirate any additional fluid.  Placed on 5 days of doxycycline.  On 5/6 she reported the fevers had resolved and there was no palpable fluid collection. No injectable saline was placed.  ~ 8 weeks PO. Today she reports overall she is doing well. Some pain lingers in her lateral right breast/axilla region.  Latissimus flap is taking well with good cap refill.  All incisions are healing very well, c/d/i. Erythema covering center of chest onto medial aspect of both breasts. No warmth or tenderness present. Patient denies F, N/V.  Patient would like to be a little larger and there is sufficient skin laxity to do a fill.   We placed injectable saline in the Expander using a sterile technique: Right: 50 cc for a total of 280 / 375 cc Left: 50 cc for a total of 410 / 375 cc  Follow up in 2 weeks for additional fill.  Sent 5 days Doxy to pharmacy. Call office if redness is not resolved in 5 days. Call office with questions/concerns or if condition worsens.   The [place] was signed into law in 2016 which includes the topic of electronic health records.  This provides immediate access to information in MyChart.  This includes consultation notes, operative notes, office notes, lab results and pathology reports.  If you have any questions about what you read please let us know at your next visit or call us at the office.  We are right here with you.

## 2020-04-06 ENCOUNTER — Other Ambulatory Visit: Payer: Self-pay

## 2020-04-06 ENCOUNTER — Ambulatory Visit (INDEPENDENT_AMBULATORY_CARE_PROVIDER_SITE_OTHER): Payer: 59 | Admitting: Plastic Surgery

## 2020-04-06 ENCOUNTER — Encounter: Payer: Self-pay | Admitting: Plastic Surgery

## 2020-04-06 VITALS — BP 116/77 | HR 88 | Temp 98.2°F | Ht 65.0 in | Wt 156.0 lb

## 2020-04-06 DIAGNOSIS — Z9013 Acquired absence of bilateral breasts and nipples: Secondary | ICD-10-CM

## 2020-04-06 DIAGNOSIS — C50411 Malignant neoplasm of upper-outer quadrant of right female breast: Secondary | ICD-10-CM

## 2020-04-06 MED ORDER — DOXYCYCLINE HYCLATE 100 MG PO TABS: 100.0000 mg | ORAL_TABLET | Freq: Two times a day (BID) | ORAL | 0 refills | Status: AC

## 2020-04-07 ENCOUNTER — Other Ambulatory Visit: Payer: Self-pay | Admitting: Hematology and Oncology

## 2020-04-07 DIAGNOSIS — C50411 Malignant neoplasm of upper-outer quadrant of right female breast: Secondary | ICD-10-CM

## 2020-04-10 ENCOUNTER — Encounter: Payer: Self-pay | Admitting: Plastic Surgery

## 2020-04-10 ENCOUNTER — Other Ambulatory Visit: Payer: Self-pay

## 2020-04-10 ENCOUNTER — Ambulatory Visit (INDEPENDENT_AMBULATORY_CARE_PROVIDER_SITE_OTHER): Payer: 59 | Admitting: Plastic Surgery

## 2020-04-10 ENCOUNTER — Telehealth: Payer: Self-pay | Admitting: Plastic Surgery

## 2020-04-10 VITALS — BP 111/67 | HR 87 | Temp 97.7°F | Ht 65.0 in | Wt 156.0 lb

## 2020-04-10 DIAGNOSIS — Z9013 Acquired absence of bilateral breasts and nipples: Secondary | ICD-10-CM

## 2020-04-10 DIAGNOSIS — C50411 Malignant neoplasm of upper-outer quadrant of right female breast: Secondary | ICD-10-CM

## 2020-04-10 DIAGNOSIS — Z9889 Other specified postprocedural states: Secondary | ICD-10-CM

## 2020-04-10 NOTE — Progress Notes (Signed)
Patient is a 64 year old female here for follow-up after undergoing right breast reconstruction with latissimus myocutaneous flap on 02/07/2020 with Dr. Marla Roe.  At her 4/29 visit she had been running fevers for the last 2 weeks and had some fluid collection near her back incision and right axillary region with mild redness on her superior right breast.  65 cc of serosanguineous fluid was aspirated from her right lateral back; there was still visible fluid present but unable to aspirate any additional fluid.  She was placed on 5 days of doxycycline.  At her 5/6 visit she reported the fevers had resolved and there was no palpable fluid collection.  At her 5/20 visit she was doing very well.  Latissimus flap was taking well, incisions were C/D/I.  She had mild erythema covering the center of her chest onto the medial aspect of both breasts.  No warmth or tenderness present.  Patient denied fevers and nausea/vomiting.  There was good skin laxity so 50 cc of injectable saline was replaced in both the left and right expanders using sterile technique.  Patient was given 5 days of doxycycline.  Today she was here for concerns of a blister that formed Saturday and popped on her right breast inferior to the latissimus incision.  Erythema is present on the central chest and right breast.  There is dry yellow crusted spots on the central chest and inferior to the medial aspect of the right breast.  Serous fluid is weeping from the inferior incision of the latissimus flap closest to the medial aspect.  Patient denies fever, pain, nausea/vomiting.  She reports she has been taking the doxycycline and has approximately 1 day left.   Apply Vaseline over blistered area and areas of redness, cover with ABD and secure.  Continue taking the doxycycline.  Follow-up with Dr. Marla Roe tomorrow in the office.  Discussed with patient the possibility of being admitted for IV antibiotics and/or other intervention.  Patient verbalized  understanding and is in agreement with plan.

## 2020-04-10 NOTE — Telephone Encounter (Signed)
Patient was in last week for a fill and she said on Saturday she noticed a spot that appears to be like a blister on the right side and there is a burning sensation as if you popped a blister. It's kind of red around the area. Please call her to advise what she should do or if there is a reason to be concerned.

## 2020-04-10 NOTE — Telephone Encounter (Signed)
Spoke with patient.  She will plan to come in today to be seen. Added to schedule.

## 2020-04-11 ENCOUNTER — Encounter: Payer: Self-pay | Admitting: Plastic Surgery

## 2020-04-11 ENCOUNTER — Ambulatory Visit (INDEPENDENT_AMBULATORY_CARE_PROVIDER_SITE_OTHER): Payer: 59 | Admitting: Plastic Surgery

## 2020-04-11 VITALS — BP 95/6 | HR 80 | Temp 97.8°F | Ht 65.0 in | Wt 156.0 lb

## 2020-04-11 DIAGNOSIS — Z9889 Other specified postprocedural states: Secondary | ICD-10-CM

## 2020-04-11 MED ORDER — DOXYCYCLINE HYCLATE 100 MG PO TABS: 100.0000 mg | ORAL_TABLET | Freq: Two times a day (BID) | ORAL | 0 refills | Status: AC

## 2020-04-11 MED ORDER — TRIAMCINOLONE ACETONIDE 0.5 % EX OINT: 1.0000 "application " | TOPICAL_OINTMENT | Freq: Two times a day (BID) | CUTANEOUS | 0 refills | Status: DC

## 2020-04-11 NOTE — Progress Notes (Signed)
The patient is a 64 year old female here for follow-up and reevaluation of her right breast.  She was seen yesterday and had some concerns about some redness and swelling.  She does look a little bit better today according to [place] who saw her yesterday and in comparing the pictures.  She has a little bit of a rash due to tape irritation.  I tried to aspirate any fluid from the pocket.  I was not able to.  I removed 30 cc from the right expander.  We will refill the doxycycline and I would like to see her back on Friday.  I will also send in some steroid cream for her rash.  Expander volume: Right: -30 cc for a total of 250 / 375 cc Left:  410 / 375 cc

## 2020-04-14 ENCOUNTER — Ambulatory Visit (INDEPENDENT_AMBULATORY_CARE_PROVIDER_SITE_OTHER): Payer: 59 | Admitting: Plastic Surgery

## 2020-04-14 ENCOUNTER — Other Ambulatory Visit: Payer: Self-pay

## 2020-04-14 ENCOUNTER — Encounter: Payer: Self-pay | Admitting: Plastic Surgery

## 2020-04-14 VITALS — BP 109/72 | HR 90 | Temp 97.3°F | Ht 65.0 in | Wt 156.0 lb

## 2020-04-14 DIAGNOSIS — Z9013 Acquired absence of bilateral breasts and nipples: Secondary | ICD-10-CM

## 2020-04-14 DIAGNOSIS — Z9889 Other specified postprocedural states: Secondary | ICD-10-CM

## 2020-04-14 NOTE — Progress Notes (Signed)
   Subjective:    Patient ID: Megan Vega, female    DOB: 11-17-1956, 64 y.o.   MRN: [id number]  The patient is a 64 year old female here for follow-up after undergoing right breast reconstruction.  She had a latissimus muscle flap placed with expander.  She had quite a bit of redness and swelling.  We have been treating her with antibiotics and watching her closely.  She states she feels a lot better.  The rash she had has resolved that was from the tape.  Her back appears to have a seroma.  We were able to drain 100 cc of serous fluid.  The overall area does appear to be slightly better.    Review of Systems  Constitutional: Negative for activity change.  Respiratory: Negative.  Negative for chest tightness.   Cardiovascular: Negative.   Musculoskeletal: Negative.        Objective:   Physical Exam Vitals and nursing note reviewed.  Constitutional:      Appearance: Normal appearance.  Cardiovascular:     Rate and Rhythm: Normal rate.  Pulmonary:     Effort: Pulmonary effort is normal.  Abdominal:     General: Abdomen is flat.  Neurological:     General: No focal deficit present.     Mental Status: She is alert. Mental status is at baseline.  Psychiatric:        Mood and Affect: Mood normal.        Behavior: Behavior normal.           Assessment & Plan:     ICD-10-CM   1. S/P mastectomy, bilateral  Z90.13   2. S/P breast reconstruction  Z98.890     definitely continue with the antibiotics.  We drained 100 cc of serous fluid on the back.  I will plan on seeing her back in 1 week.  Call if there is any progression of the redness.

## 2020-04-19 ENCOUNTER — Ambulatory Visit: Payer: 59 | Admitting: Plastic Surgery

## 2020-04-21 ENCOUNTER — Ambulatory Visit (INDEPENDENT_AMBULATORY_CARE_PROVIDER_SITE_OTHER): Payer: 59 | Admitting: Plastic Surgery

## 2020-04-21 ENCOUNTER — Encounter: Payer: Self-pay | Admitting: Plastic Surgery

## 2020-04-21 ENCOUNTER — Other Ambulatory Visit: Payer: Self-pay

## 2020-04-21 VITALS — BP 110/73 | HR 83 | Temp 97.3°F | Ht 65.0 in | Wt 156.0 lb

## 2020-04-21 DIAGNOSIS — N6489 Other specified disorders of breast: Secondary | ICD-10-CM

## 2020-04-21 MED ORDER — SULFAMETHOXAZOLE-TRIMETHOPRIM 800-160 MG PO TABS: 1.0000 | ORAL_TABLET | Freq: Two times a day (BID) | ORAL | 0 refills | Status: AC

## 2020-04-21 NOTE — Progress Notes (Signed)
   Subjective:    Patient ID: Megan Vega, female    DOB: Feb 29, 1956, 64 y.o.   MRN: [id number]  The patient is a 64 year old female here for follow-up on her right breast reconstruction.  She has been on doxycycline is still present.  There is a little bit of drainage coming from the medial aspect incision.  She states she feels okay.  She does not have any a little uncomfortable but she does not have any pain.  Her filling up with fluid there is a small area of redness on the inferior most portion of the latissimus incision of the back.     Review of Systems  Constitutional: Negative for activity change.  Respiratory: Negative for chest tightness and shortness of breath.   Cardiovascular: Negative.   Genitourinary: Negative.   Skin: Positive for color change and wound.  Hematological: Negative.   Psychiatric/Behavioral: Negative.        Objective:   Physical Exam Vitals and nursing note reviewed.  Constitutional:      Appearance: Normal appearance.  HENT:     Head: Atraumatic.  Cardiovascular:     Rate and Rhythm: Normal rate.     Pulses: Normal pulses.  Pulmonary:     Effort: Pulmonary effort is normal.  Chest:    Neurological:     General: No focal deficit present.     Mental Status: She is alert and oriented to person, place, and time.  Psychiatric:        Mood and Affect: Mood normal.        Behavior: Behavior normal.        Thought Content: Thought content normal.       Assessment & Plan:     ICD-10-CM   1. Seroma of breast  N64.89      I am going to switch her to Bactrim DS.  I will also send gram stain, culture and sensitivity of the back fluid (60 cc serous).  I am concerned about the redness.  Call with any worsening of the area.

## 2020-04-25 LAB — GRAM STAIN/BODY FLUID CULTURE: Organism ID, Bacteria: NONE SEEN

## 2020-04-28 ENCOUNTER — Ambulatory Visit (INDEPENDENT_AMBULATORY_CARE_PROVIDER_SITE_OTHER): Payer: 59 | Admitting: Plastic Surgery

## 2020-04-28 ENCOUNTER — Encounter: Payer: Self-pay | Admitting: Plastic Surgery

## 2020-04-28 ENCOUNTER — Other Ambulatory Visit: Payer: Self-pay

## 2020-04-28 VITALS — BP 116/74 | HR 82 | Temp 97.7°F

## 2020-04-28 DIAGNOSIS — Z9011 Acquired absence of right breast and nipple: Secondary | ICD-10-CM

## 2020-04-28 DIAGNOSIS — Z9013 Acquired absence of bilateral breasts and nipples: Secondary | ICD-10-CM

## 2020-04-28 DIAGNOSIS — C50411 Malignant neoplasm of upper-outer quadrant of right female breast: Secondary | ICD-10-CM

## 2020-04-28 NOTE — Progress Notes (Signed)
   Subjective:    Patient ID: Megan Vega, female    DOB: 01/21/1956, 64 y.o.   MRN: [id number]  The patient is a 64 year old female here for follow-up on her right breast wound.  She is undergoing reconstruction and has expanders on both sides.  The redness and cellulitic is markedly improved she is doing much better with the change in antibiotic.  There is still some overall she is feeling much better.  She denies any fevers.   Review of Systems  Constitutional: Negative.   Eyes: Negative.   Respiratory: Negative.   Cardiovascular: Negative.   Gastrointestinal: Negative.   Genitourinary: Negative.   Skin: Positive for color change.       Objective:   Physical Exam Vitals and nursing note reviewed.  Constitutional:      Appearance: Normal appearance.  HENT:     Head: Normocephalic and atraumatic.  Cardiovascular:     Rate and Rhythm: Normal rate.     Pulses: Normal pulses.  Pulmonary:     Effort: Pulmonary effort is normal.  Neurological:     General: No focal deficit present.     Mental Status: She is alert. Mental status is at baseline.        Assessment & Plan:     ICD-10-CM   1. Acquired absence of right breast  Z90.11   2. Acquired absence of both breasts  Z90.13   3. Primary cancer of upper outer quadrant of right breast ([place])  C50.411     We will see if I can get some calcium alginate sent to her house.  I would like to see her back next week.  We did not feel the right breast today.  She is happy with the size of the left breast.

## 2020-04-30 LAB — ANAEROBIC/AEROBIC/GRAM STAIN

## 2020-05-01 ENCOUNTER — Telehealth: Payer: Self-pay | Admitting: *Deleted

## 2020-05-01 NOTE — Telephone Encounter (Signed)
Faxed order to [facility] on (04/28/20) for supplies for the patient.  Confirmation received.  Copy scanned into the chart.//AB/CMA

## 2020-05-04 ENCOUNTER — Ambulatory Visit: Payer: 59 | Admitting: Plastic Surgery

## 2020-05-04 ENCOUNTER — Encounter: Payer: Self-pay | Admitting: Plastic Surgery

## 2020-05-04 ENCOUNTER — Other Ambulatory Visit: Payer: Self-pay

## 2020-05-04 ENCOUNTER — Ambulatory Visit (INDEPENDENT_AMBULATORY_CARE_PROVIDER_SITE_OTHER): Payer: 59 | Admitting: Plastic Surgery

## 2020-05-04 VITALS — BP 116/76 | HR 80 | Temp 97.3°F

## 2020-05-04 DIAGNOSIS — Z9889 Other specified postprocedural states: Secondary | ICD-10-CM

## 2020-05-04 MED ORDER — SULFAMETHOXAZOLE-TRIMETHOPRIM 800-160 MG PO TABS: 1.0000 | ORAL_TABLET | Freq: Two times a day (BID) | ORAL | 0 refills | Status: AC

## 2020-05-04 NOTE — Progress Notes (Signed)
° °  Subjective:    Patient ID: Megan Vega, female    DOB: 10/29/1956, 64 y.o.   MRN: [id number]  The patient is a 64 year old female here for follow-up on her right breast.  She has an expander in place on both sides.  The right side started getting red 2 weeks ago.  She has not had any fevers.  The pain is minimal to none.  We are planning on exchange.  The concern has been some swelling and redness.  It has been getting better since she started the antibiotic.     Review of Systems  Constitutional: Negative.   HENT: Negative.   Eyes: Negative.   Respiratory: Negative.   Cardiovascular: Negative.   Genitourinary: Negative.        Objective:   Physical Exam Vitals and nursing note reviewed.  Constitutional:      Appearance: Normal appearance.  HENT:     Head: Normocephalic and atraumatic.  Cardiovascular:     Rate and Rhythm: Normal rate.     Pulses: Normal pulses.  Pulmonary:     Effort: Pulmonary effort is normal.  Neurological:     General: No focal deficit present.     Mental Status: She is alert. Mental status is at baseline.  Psychiatric:        Mood and Affect: Mood normal.        Behavior: Behavior normal.        Thought Content: Thought content normal.        Assessment & Plan:     ICD-10-CM   1. S/P breast reconstruction  Z98.890     Donated Acell was applied to the 1 cm opening.  She will continue with the antibiotics.  I would like to see her in one week.  She knows to call with any change.

## 2020-05-06 ENCOUNTER — Encounter: Payer: Self-pay | Admitting: Hematology and Oncology

## 2020-05-08 ENCOUNTER — Other Ambulatory Visit: Payer: Self-pay

## 2020-05-08 ENCOUNTER — Other Ambulatory Visit: Payer: Self-pay | Admitting: Hematology and Oncology

## 2020-05-08 DIAGNOSIS — G620 Drug-induced polyneuropathy: Secondary | ICD-10-CM

## 2020-05-08 DIAGNOSIS — T451X5A Adverse effect of antineoplastic and immunosuppressive drugs, initial encounter: Secondary | ICD-10-CM

## 2020-05-08 MED ORDER — GABAPENTIN 100 MG PO CAPS: 200.0000 mg | ORAL_CAPSULE | Freq: Two times a day (BID) | ORAL | 1 refills | Status: DC

## 2020-05-16 ENCOUNTER — Other Ambulatory Visit: Payer: Self-pay

## 2020-05-16 ENCOUNTER — Ambulatory Visit (INDEPENDENT_AMBULATORY_CARE_PROVIDER_SITE_OTHER): Payer: 59 | Admitting: Plastic Surgery

## 2020-05-16 ENCOUNTER — Encounter: Payer: Self-pay | Admitting: Plastic Surgery

## 2020-05-16 VITALS — BP 116/62 | HR 92 | Temp 98.2°F

## 2020-05-16 DIAGNOSIS — Z9889 Other specified postprocedural states: Secondary | ICD-10-CM

## 2020-05-16 DIAGNOSIS — N6489 Other specified disorders of breast: Secondary | ICD-10-CM

## 2020-05-16 MED ORDER — SULFAMETHOXAZOLE-TRIMETHOPRIM 800-160 MG PO TABS: 1.0000 | ORAL_TABLET | Freq: Two times a day (BID) | ORAL | 0 refills | Status: AC

## 2020-05-16 NOTE — Progress Notes (Signed)
   Subjective:    Patient ID: Megan Vega, female    DOB: [birth year], 64 y.o.   MRN: [id number]  The patient is a 64 year old female here with her daughter for follow-up on her right breast reconstruction. She has had redness slowly responded to the antibiotic treatment. She does have a wound now that seems to be tracking superficially for 6 cm laterally. I am also concerned that there is some thinning of the skin at the end here portion of the breast where the expander may be pushing against the skin. I went ahead and removed 50 cc of fluid from the expander. The patient has not had any fevers or chills. Overall she feels good.     Review of Systems  Constitutional: Negative.   HENT: Negative.   Eyes: Negative.   Respiratory: Negative.   Cardiovascular: Negative.   Gastrointestinal: Negative.   Endocrine: Negative.   Genitourinary: Negative.   Skin: Positive for color change and wound.  Neurological: Negative.   Hematological: Negative.        Objective:   Physical Exam Vitals and nursing note reviewed.  Constitutional:      Appearance: Normal appearance.  HENT:     Head: Normocephalic and atraumatic.  Cardiovascular:     Rate and Rhythm: Normal rate.     Pulses: Normal pulses.  Pulmonary:     Effort: Pulmonary effort is normal.  Chest:    Neurological:     General: No focal deficit present.     Mental Status: She is alert and oriented to person, place, and time.  Psychiatric:        Mood and Affect: Mood normal.        Behavior: Behavior normal.       Assessment & Plan:     ICD-10-CM   1. S/P breast reconstruction  Z98.890   2. Seroma of breast  N64.89     I think at this time surgical intervention is needed. I would like to take the patient to the OR and remove the expander washed the pocket out and place a new expander. If we get in there and there appears to be frank infection we would not be able to place the expander and would have to come back in  approximately 6 weeks to put an expander in. It would not be safe to put an implant in at this time period.

## 2020-05-16 NOTE — H&P (View-Only) (Signed)
   Subjective:    Patient ID: Megan Vega, female    DOB: [birth year], 64 y.o.   MRN: [id number]  The patient is a 64 year old female here with her daughter for follow-up on her right breast reconstruction. She has had redness slowly responded to the antibiotic treatment. She does have a wound now that seems to be tracking superficially for 6 cm laterally. I am also concerned that there is some thinning of the skin at the end here portion of the breast where the expander may be pushing against the skin. I went ahead and removed 50 cc of fluid from the expander. The patient has not had any fevers or chills. Overall she feels good.     Review of Systems  Constitutional: Negative.   HENT: Negative.   Eyes: Negative.   Respiratory: Negative.   Cardiovascular: Negative.   Gastrointestinal: Negative.   Endocrine: Negative.   Genitourinary: Negative.   Skin: Positive for color change and wound.  Neurological: Negative.   Hematological: Negative.        Objective:   Physical Exam Vitals and nursing note reviewed.  Constitutional:      Appearance: Normal appearance.  HENT:     Head: Normocephalic and atraumatic.  Cardiovascular:     Rate and Rhythm: Normal rate.     Pulses: Normal pulses.  Pulmonary:     Effort: Pulmonary effort is normal.  Chest:    Neurological:     General: No focal deficit present.     Mental Status: She is alert and oriented to person, place, and time.  Psychiatric:        Mood and Affect: Mood normal.        Behavior: Behavior normal.       Assessment & Plan:     ICD-10-CM   1. S/P breast reconstruction  Z98.890   2. Seroma of breast  N64.89     I think at this time surgical intervention is needed. I would like to take the patient to the OR and remove the expander washed the pocket out and place a new expander. If we get in there and there appears to be frank infection we would not be able to place the expander and would have to come back in  approximately 6 weeks to put an expander in. It would not be safe to put an implant in at this time period.

## 2020-05-29 ENCOUNTER — Telehealth (INDEPENDENT_AMBULATORY_CARE_PROVIDER_SITE_OTHER): Payer: 59 | Admitting: Plastic Surgery

## 2020-05-29 DIAGNOSIS — Z9889 Other specified postprocedural states: Secondary | ICD-10-CM

## 2020-05-29 MED ORDER — DOXYCYCLINE HYCLATE 100 MG PO TABS: 100.0000 mg | ORAL_TABLET | Freq: Two times a day (BID) | ORAL | 0 refills | Status: AC

## 2020-05-29 NOTE — Telephone Encounter (Cosign Needed)
Patient reports wound has gotten a little bigger and now there is some blood mixed in with the drainage. Overall drainage has not increases. Denies F, N/V, Purulent drainage.  Per Dr Marla Roe, placing on Abx for 7 days until surgery. Advised patient to call office and come in to see [place] if anything changes.

## 2020-05-29 NOTE — Telephone Encounter (Signed)
Patient called to advise Dr. Marla Roe that she is now having bleeding along with clear drainage from before, but she is scheduled to have surgery on Monday of next week. Please call to patient to advise if she needs to be seen again before surgery next week.

## 2020-06-01 ENCOUNTER — Other Ambulatory Visit
Admission: RE | Admit: 2020-06-01 | Discharge: 2020-06-01 | Disposition: A | Discharge status: Home / Self Care | Payer: 59 | Source: Ambulatory Visit | Attending: Plastic Surgery | Admitting: Plastic Surgery

## 2020-06-01 ENCOUNTER — Other Ambulatory Visit: Payer: Self-pay

## 2020-06-01 DIAGNOSIS — Z20822 Contact with and (suspected) exposure to covid-19: Secondary | ICD-10-CM | POA: Diagnosis not present

## 2020-06-01 LAB — SARS CORONAVIRUS 2 (TAT 6-24 HRS): SARS Coronavirus 2: NEGATIVE

## 2020-06-05 ENCOUNTER — Ambulatory Visit: Payer: 59 | Admitting: Anesthesiology

## 2020-06-05 ENCOUNTER — Encounter: Payer: Self-pay | Admitting: Plastic Surgery

## 2020-06-05 ENCOUNTER — Other Ambulatory Visit: Payer: Self-pay

## 2020-06-05 ENCOUNTER — Ambulatory Visit
Admission: RE | Admit: 2020-06-05 | Discharge: 2020-06-05 | Disposition: A | Discharge status: Home / Self Care | Payer: 59 | Attending: Plastic Surgery | Admitting: Plastic Surgery

## 2020-06-05 ENCOUNTER — Encounter
Admission: RE | Disposition: A | Discharge status: Home / Self Care | Payer: Self-pay | Source: Home / Self Care | Attending: Plastic Surgery

## 2020-06-05 DIAGNOSIS — Z9221 Personal history of antineoplastic chemotherapy: Secondary | ICD-10-CM | POA: Diagnosis not present

## 2020-06-05 DIAGNOSIS — Y838 Other surgical procedures as the cause of abnormal reaction of the patient, or of later complication, without mention of misadventure at the time of the procedure: Secondary | ICD-10-CM | POA: Diagnosis not present

## 2020-06-05 DIAGNOSIS — Z87891 Personal history of nicotine dependence: Secondary | ICD-10-CM | POA: Insufficient documentation

## 2020-06-05 DIAGNOSIS — Z853 Personal history of malignant neoplasm of breast: Secondary | ICD-10-CM | POA: Insufficient documentation

## 2020-06-05 DIAGNOSIS — Z9889 Other specified postprocedural states: Secondary | ICD-10-CM

## 2020-06-05 DIAGNOSIS — G43909 Migraine, unspecified, not intractable, without status migrainosus: Secondary | ICD-10-CM | POA: Insufficient documentation

## 2020-06-05 DIAGNOSIS — Z8614 Personal history of Methicillin resistant Staphylococcus aureus infection: Secondary | ICD-10-CM | POA: Diagnosis not present

## 2020-06-05 DIAGNOSIS — N6489 Other specified disorders of breast: Secondary | ICD-10-CM

## 2020-06-05 DIAGNOSIS — Z923 Personal history of irradiation: Secondary | ICD-10-CM | POA: Insufficient documentation

## 2020-06-05 DIAGNOSIS — L7634 Postprocedural seroma of skin and subcutaneous tissue following other procedure: Secondary | ICD-10-CM | POA: Insufficient documentation

## 2020-06-05 HISTORY — PX: REMOVAL OF TISSUE EXPANDER: SHX6324

## 2020-06-05 HISTORY — PX: TISSUE EXPANDER PLACEMENT: SHX2530

## 2020-06-05 SURGERY — REMOVAL, TISSUE EXPANDER
Anesthesia: General | Site: Breast | Laterality: Right

## 2020-06-05 MED ORDER — SODIUM CHLORIDE FLUSH 0.9 % IV SOLN
INTRAVENOUS | Status: AC
  Filled 2020-06-05: qty 10

## 2020-06-05 MED ORDER — FENTANYL CITRATE (PF) 100 MCG/2ML IJ SOLN
INTRAMUSCULAR | Status: AC
  Filled 2020-06-05: qty 2

## 2020-06-05 MED ORDER — BUPIVACAINE-EPINEPHRINE (PF) 0.25% -1:200000 IJ SOLN
INTRAMUSCULAR | Status: AC
  Filled 2020-06-05: qty 30

## 2020-06-05 MED ORDER — LIDOCAINE HCL (PF) 2 % IJ SOLN
INTRAMUSCULAR | Status: AC
  Filled 2020-06-05: qty 5

## 2020-06-05 MED ORDER — OXYCODONE HCL 5 MG PO TABS: 5.0000 mg | ORAL_TABLET | Freq: Once | ORAL | Status: DC | PRN

## 2020-06-05 MED ORDER — BACITRACIN 50000 UNITS IM SOLR
INTRAMUSCULAR | Status: AC
  Filled 2020-06-05: qty 1

## 2020-06-05 MED ORDER — ACETAMINOPHEN 10 MG/ML IV SOLN
INTRAVENOUS | Status: DC | PRN
  Administered 2020-06-05: 1000 mg via INTRAVENOUS

## 2020-06-05 MED ORDER — CHLORHEXIDINE GLUCONATE 0.12 % MT SOLN: 15.0000 mL | Freq: Once | OROMUCOSAL | Status: AC

## 2020-06-05 MED ORDER — MIDAZOLAM HCL 2 MG/2ML IJ SOLN
INTRAMUSCULAR | Status: AC
  Filled 2020-06-05: qty 2

## 2020-06-05 MED ORDER — CEFAZOLIN SODIUM-DEXTROSE 2-4 GM/100ML-% IV SOLN
INTRAVENOUS | Status: AC
  Filled 2020-06-05: qty 100

## 2020-06-05 MED ORDER — ONDANSETRON HCL 4 MG/2ML IJ SOLN
INTRAMUSCULAR | Status: DC | PRN
  Administered 2020-06-05: 4 mg via INTRAVENOUS

## 2020-06-05 MED ORDER — OXYCODONE HCL 5 MG/5ML PO SOLN: 5.0000 mg | Freq: Once | ORAL | Status: DC | PRN

## 2020-06-05 MED ORDER — PROMETHAZINE HCL 25 MG/ML IJ SOLN: 6.2500 mg | INTRAMUSCULAR | Status: DC | PRN

## 2020-06-05 MED ORDER — CEFAZOLIN SODIUM-DEXTROSE 2-4 GM/100ML-% IV SOLN
2.0000 g | INTRAVENOUS | Status: AC
  Administered 2020-06-05: 2 g via INTRAVENOUS

## 2020-06-05 MED ORDER — SEVOFLURANE IN SOLN
RESPIRATORY_TRACT | Status: AC
  Filled 2020-06-05: qty 250

## 2020-06-05 MED ORDER — FENTANYL CITRATE (PF) 100 MCG/2ML IJ SOLN
INTRAMUSCULAR | Status: DC | PRN
  Administered 2020-06-05 (×4): 25 ug via INTRAVENOUS

## 2020-06-05 MED ORDER — ORAL CARE MOUTH RINSE: 15.0000 mL | Freq: Once | OROMUCOSAL | Status: AC

## 2020-06-05 MED ORDER — PHENYLEPHRINE HCL (PRESSORS) 10 MG/ML IV SOLN
INTRAVENOUS | Status: AC
  Filled 2020-06-05: qty 1

## 2020-06-05 MED ORDER — CHLORHEXIDINE GLUCONATE CLOTH 2 % EX PADS: 6.0000 | MEDICATED_PAD | Freq: Once | CUTANEOUS | Status: DC

## 2020-06-05 MED ORDER — PROPOFOL 10 MG/ML IV BOLUS
INTRAVENOUS | Status: AC
  Filled 2020-06-05: qty 20

## 2020-06-05 MED ORDER — NEOMYCIN-POLYMYXIN B GU 40-200000 IR SOLN
Status: AC
  Filled 2020-06-05: qty 2

## 2020-06-05 MED ORDER — DEXAMETHASONE SODIUM PHOSPHATE 10 MG/ML IJ SOLN
INTRAMUSCULAR | Status: AC
  Filled 2020-06-05: qty 1

## 2020-06-05 MED ORDER — PHENYLEPHRINE HCL (PRESSORS) 10 MG/ML IV SOLN
INTRAVENOUS | Status: DC | PRN
  Administered 2020-06-05: 50 ug via INTRAVENOUS
  Administered 2020-06-05 (×3): 100 ug via INTRAVENOUS
  Administered 2020-06-05: 50 ug via INTRAVENOUS

## 2020-06-05 MED ORDER — LACTATED RINGERS IV SOLN: INTRAVENOUS | Status: DC

## 2020-06-05 MED ORDER — HYDROCODONE-ACETAMINOPHEN 5-325 MG PO TABS: 1.0000 | ORAL_TABLET | Freq: Two times a day (BID) | ORAL | 0 refills | Status: AC | PRN

## 2020-06-05 MED ORDER — NEOMYCIN-POLYMYXIN B GU 40-200000 IR SOLN
Status: AC
  Filled 2020-06-05: qty 40

## 2020-06-05 MED ORDER — SODIUM CHLORIDE 0.9 % IV SOLN
INTRAVENOUS | Status: DC | PRN
  Administered 2020-06-05: 1000 mL

## 2020-06-05 MED ORDER — PROPOFOL 10 MG/ML IV BOLUS
INTRAVENOUS | Status: DC | PRN
  Administered 2020-06-05: 130 mg via INTRAVENOUS

## 2020-06-05 MED ORDER — FENTANYL CITRATE (PF) 100 MCG/2ML IJ SOLN: 25.0000 ug | INTRAMUSCULAR | Status: DC | PRN

## 2020-06-05 MED ORDER — MEPERIDINE HCL 50 MG/ML IJ SOLN: 6.2500 mg | INTRAMUSCULAR | Status: DC | PRN

## 2020-06-05 MED ORDER — CHLORHEXIDINE GLUCONATE 0.12 % MT SOLN
OROMUCOSAL | Status: AC
  Administered 2020-06-05: 15 mL via OROMUCOSAL
  Filled 2020-06-05: qty 15

## 2020-06-05 MED ORDER — LIDOCAINE HCL (CARDIAC) PF 100 MG/5ML IV SOSY
PREFILLED_SYRINGE | INTRAVENOUS | Status: DC | PRN
  Administered 2020-06-05: 80 mg via INTRAVENOUS

## 2020-06-05 MED ORDER — ONDANSETRON HCL 4 MG/2ML IJ SOLN
INTRAMUSCULAR | Status: AC
  Filled 2020-06-05: qty 2

## 2020-06-05 MED ORDER — ACETAMINOPHEN 10 MG/ML IV SOLN
INTRAVENOUS | Status: AC
  Filled 2020-06-05: qty 100

## 2020-06-05 MED ORDER — MIDAZOLAM HCL 2 MG/2ML IJ SOLN
INTRAMUSCULAR | Status: DC | PRN
  Administered 2020-06-05: 2 mg via INTRAVENOUS

## 2020-06-05 MED ORDER — SULFAMETHOXAZOLE-TRIMETHOPRIM 800-160 MG PO TABS
1.0000 | ORAL_TABLET | Freq: Two times a day (BID) | ORAL | 0 refills | Status: AC
Start: 2020-06-05 — End: 2020-06-15

## 2020-06-05 SURGICAL SUPPLY — 56 items
ADH SKN CLS APL DERMABOND .7 (GAUZE/BANDAGES/DRESSINGS) ×4
BAG DECANTER FOR FLEXI CONT (MISCELLANEOUS) ×3 IMPLANT
BINDER BREAST LRG (GAUZE/BANDAGES/DRESSINGS) ×1 IMPLANT
BINDER BREAST MEDIUM (GAUZE/BANDAGES/DRESSINGS) IMPLANT
BINDER BREAST XLRG (GAUZE/BANDAGES/DRESSINGS) IMPLANT
BINDER BREAST XXLRG (GAUZE/BANDAGES/DRESSINGS) IMPLANT
BIOPATCH WHT 1IN DISK W/4.0 H (GAUZE/BANDAGES/DRESSINGS) ×7 IMPLANT
BLADE BOVIE TIP EXT 4 (BLADE) ×3 IMPLANT
BLADE SURG 15 STRL LF DISP TIS (BLADE) ×2 IMPLANT
BLADE SURG 15 STRL SS (BLADE) ×3
BNDG GAUZE 4.5X4.1 6PLY STRL (MISCELLANEOUS) ×6 IMPLANT
BULB RESERV EVAC DRAIN JP 100C (MISCELLANEOUS) ×6 IMPLANT
CANISTER SUCT 1200ML W/VALVE (MISCELLANEOUS) ×3 IMPLANT
DECANTER SPIKE VIAL GLASS SM (MISCELLANEOUS) IMPLANT
DERMABOND ADVANCED (GAUZE/BANDAGES/DRESSINGS) ×2
DERMABOND ADVANCED .7 DNX12 (GAUZE/BANDAGES/DRESSINGS) ×4 IMPLANT
DRAIN CHANNEL JP 19F (MISCELLANEOUS) ×7 IMPLANT
DRAPE LAPAROTOMY TRNSV 106X77 (MISCELLANEOUS) IMPLANT
ELECT CAUTERY BLADE 6.4 (BLADE) ×3 IMPLANT
ELECT CAUTERY BLADE TIP 2.5 (TIP) ×3
ELECTRODE CAUTERY BLDE TIP 2.5 (TIP) ×2 IMPLANT
GAUZE XEROFORM 1X8 LF (GAUZE/BANDAGES/DRESSINGS) ×1 IMPLANT
GLOVE BIO SURGEON STRL SZ 6.5 (GLOVE) ×12 IMPLANT
GOWN STRL REUS W/ TWL LRG LVL3 (GOWN DISPOSABLE) ×8 IMPLANT
GOWN STRL REUS W/TWL LRG LVL3 (GOWN DISPOSABLE) ×12
IV NS 1000ML (IV SOLUTION)
IV NS 1000ML BAXH (IV SOLUTION) IMPLANT
IV NS 500ML (IV SOLUTION)
IV NS 500ML BAXH (IV SOLUTION) IMPLANT
NDL FILTER BLUNT 18X1 1/2 (NEEDLE) ×4 IMPLANT
NEEDLE FILTER BLUNT 18X 1/2SAF (NEEDLE) ×2
NEEDLE FILTER BLUNT 18X1 1/2 (NEEDLE) ×4 IMPLANT
NEEDLE HYPO 22GX1.5 SAFETY (NEEDLE) IMPLANT
PACK BASIN MAJOR (MISCELLANEOUS) ×3 IMPLANT
PACK UNIVERSAL (MISCELLANEOUS) IMPLANT
PAD ABD DERMACEA PRESS 5X9 (GAUZE/BANDAGES/DRESSINGS) ×12 IMPLANT
PIN SAFETY STRL (MISCELLANEOUS) ×3 IMPLANT
SET ASEPTIC TRANSFER (MISCELLANEOUS) ×6 IMPLANT
SOL PREP PVP 2OZ (MISCELLANEOUS) ×6
SOLUTION PREP PVP 2OZ (MISCELLANEOUS) ×4 IMPLANT
SPONGE LAP 18X18 RF (DISPOSABLE) ×9 IMPLANT
SUT MNCRL 3-0 UNDYED SH (SUTURE) ×4 IMPLANT
SUT MNCRL 4-0 (SUTURE) ×6
SUT MNCRL 4-0 27XMFL (SUTURE) ×4
SUT MNCRL+ 5-0 UNDYED PC-3 (SUTURE) ×4 IMPLANT
SUT MONOCRYL 3-0 UNDYED (SUTURE) ×6
SUT MONOCRYL 5-0 (SUTURE) ×6
SUT PDS PLUS 2 (SUTURE)
SUT PDS PLUS AB 2-0 CT-1 (SUTURE) ×12 IMPLANT
SUT SILK 4 0 SH (SUTURE) ×6 IMPLANT
SUT VIC AB 3-0 SH 27 (SUTURE)
SUT VIC AB 3-0 SH 27X BRD (SUTURE) IMPLANT
SUTURE MNCRL 4-0 27XMF (SUTURE) ×4 IMPLANT
SYR 10ML LL (SYRINGE) ×6 IMPLANT
SYR BULB IRRIG 60ML STRL (SYRINGE) ×3 IMPLANT
TOWEL OR 17X26 4PK STRL BLUE (TOWEL DISPOSABLE) ×3 IMPLANT

## 2020-06-05 NOTE — Anesthesia Postprocedure Evaluation (Signed)
Anesthesia Post Note  Patient: Nickisha Hum  Procedure(s) Performed: TISSUE EXPANDER REMOVAL (Right Breast) TISSUE EXPANDER FLUID REMOVAL-100ML (Left )  Patient location during evaluation: PACU Anesthesia Type: General Level of consciousness: awake and alert and oriented Pain management: pain level controlled Vital Signs Assessment: post-procedure vital signs reviewed and stable Respiratory status: spontaneous breathing, nonlabored ventilation and respiratory function stable Cardiovascular status: blood pressure returned to baseline and stable Postop Assessment: no signs of nausea or vomiting Anesthetic complications: no   No complications documented.   Last Vitals:  Vitals:   06/05/20 1143 06/05/20 1159  BP: (!) 96/49 (!) 94/51  Pulse: 71 70  Resp: 11 14  Temp: (!) 36.3 C (!) 36 C  SpO2: 96% 97%    Last Pain:  Vitals:   06/05/20 1159  TempSrc: Temporal  PainSc: 0-No pain                 Laelani Vasko

## 2020-06-05 NOTE — Anesthesia Preprocedure Evaluation (Signed)
Anesthesia Evaluation  Patient identified by MRN, date of birth, ID band Patient awake    Reviewed: Allergy & Precautions, NPO status , Patient's Chart, lab work & pertinent test results  History of Anesthesia Complications Negative for: history of anesthetic complications  Airway Mallampati: II  TM Distance: >3 FB Neck ROM: Full    Dental  (+) Implants   Pulmonary neg sleep apnea, neg COPD, former smoker,    breath sounds clear to auscultation- rhonchi (-) wheezing      Cardiovascular (-) hypertension(-) CAD, (-) Past MI, (-) Cardiac Stents and (-) CABG  Rhythm:Regular Rate:Normal - Systolic murmurs and - Diastolic murmurs    Neuro/Psych  Headaches, neg Seizures negative psych ROS   GI/Hepatic negative GI ROS, Neg liver ROS,   Endo/Other  negative endocrine ROSneg diabetes  Renal/GU negative Renal ROS     Musculoskeletal negative musculoskeletal ROS (+)   Abdominal (+) - obese,   Peds  Hematology negative hematology ROS (+)   Anesthesia Other Findings Past Medical History: No date: Ankle fracture No date: Cancer [facility])     Comment:  Basal Cell Carcinoma, about 2011 chest No date: Cataract No date: Family history of adverse reaction to anesthesia     Comment:  DAUGHTER-N/V No date: Hemorrhoids 2015: History of methicillin resistant staphylococcus aureus (MRSA) No date: Migraines     Comment:  MIGRAINES occ No date: Personal history of chemotherapy No date: Personal history of radiation therapy No date: Wears glasses   Reproductive/Obstetrics                             Anesthesia Physical Anesthesia Plan  ASA: II  Anesthesia Plan: General   Post-op Pain Management:    Induction: Intravenous  PONV Risk Score and Plan: 2 and Ondansetron, Dexamethasone and Midazolam  Airway Management Planned: LMA  Additional Equipment:   Intra-op Plan:   Post-operative Plan:    Informed Consent: I have reviewed the patients History and Physical, chart, labs and discussed the procedure including the risks, benefits and alternatives for the proposed anesthesia with the patient or authorized representative who has indicated his/her understanding and acceptance.     Dental advisory given  Plan Discussed with: CRNA and Anesthesiologist  Anesthesia Plan Comments:         Anesthesia Quick Evaluation

## 2020-06-05 NOTE — Anesthesia Procedure Notes (Signed)
Procedure Name: LMA Insertion Date/Time: 06/05/2020 10:06 AM Performed by: Jerrye Noble, CRNA Pre-anesthesia Checklist: Emergency Drugs available, Patient identified, Suction available and Patient being monitored Patient Re-evaluated:Patient Re-evaluated prior to induction Oxygen Delivery Method: Circle system utilized Preoxygenation: Pre-oxygenation with 100% oxygen Induction Type: IV induction Ventilation: Mask ventilation without difficulty LMA: LMA inserted LMA Size: 3.5 Number of attempts: 1 Placement Confirmation: positive ETCO2 and breath sounds checked- equal and bilateral Tube secured with: Tape Dental Injury: Teeth and Oropharynx as per pre-operative assessment

## 2020-06-05 NOTE — Discharge Instructions (Addendum)
INSTRUCTIONS FOR AFTER SURGERY   You will likely have some questions about what to expect following your operation.  The following information will help you and your family understand what to expect when you are discharged from the hospital.  Following these guidelines will help ensure a smooth recovery and reduce risks of complications.  Postoperative instructions include information on: diet, wound care, medications and physical activity.  AFTER SURGERY Expect to go home after the procedure.  In some cases, you may need to spend one night in the hospital for observation.  DIET This surgery does not require a specific diet.  However, I have to mention that the healthier you eat the better your body can start healing. It is important to increasing your protein intake.  This means limiting the foods with added sugar.  Focus on fruits and vegetables and some meat.  If you have any liposuction during your procedure be sure to drink water.  If your urine is bright yellow, then it is concentrated, and you need to drink more water.  As a general rule after surgery, you should have 8 ounces of water every hour while awake.  If you find you are persistently nauseated or unable to take in liquids let us know.  NO TOBACCO USE or EXPOSURE.  This will slow your healing process and increase the risk of a wound.  WOUND CARE If you have a drain: Clean with baby wipes until the drain is removed or at least 3 days.   If you have steri-strips / tape directly attached to your skin leave them in place. It is OK to get these wet.  No baths, pools or hot tubs for two weeks. We close your incision to leave the smallest and best-looking scar. No ointment or creams on your incisions until given the go ahead.  Especially not Neosporin (Too many skin reactions with this one).  A few weeks after surgery you can use Mederma and start massaging the scar. We ask you to wear your binder or sports bra for the first 6 weeks around the  clock, including while sleeping. This provides added comfort and helps reduce the fluid accumulation at the surgery site.  ACTIVITY No heavy lifting until cleared by the doctor.  It is OK to walk and climb stairs. In fact, moving your legs is very important to decrease your risk of a blood clot.  It will also help keep you from getting deconditioned.  Every 1 to 2 hours get up and walk for 5 minutes. This will help with a quicker recovery back to normal.  Let pain be your guide so you don't do too much.  NO, you cannot do the spring cleaning and don't plan on taking care of anyone else.  This is your time for TLC.   WORK Everyone returns to work at different times. As a rough guide, most people take at least 1 - 2 weeks off prior to returning to work. If you need documentation for your job, bring the forms to your postoperative follow up visit.  DRIVING Arrange for someone to bring you home from the hospital.  You may be able to drive a few days after surgery but not while taking any narcotics or valium.  BOWEL MOVEMENTS Constipation can occur after anesthesia and while taking pain medication.  It is important to stay ahead for your comfort.  We recommend taking Milk of Magnesia (2 tablespoons; twice a day) while taking the pain pills.  SEROMA This  is fluid your body tried to put in the surgical site.  This is normal but if it creates excessive pain and swelling let us know.  It usually decreases in a few weeks.  MEDICATIONS and PAIN CONTROL At your preoperative visit for you history and physical you were given the following medications: 1. An antibiotic: Start this medication when you get home and take according to the instructions on the bottle. 2. Zofran 4 mg:  This is to treat nausea and vomiting.  You can take this every 6 hours as needed and only if needed. 3. Norco (hydrocodone/acetaminophen) 5/325 mg:  This is only to be used after you have taken the motrin or the tylenol. Every 8 hours  as needed. Over the counter Medication to take: 4. Ibuprofen (Motrin) 600 mg:  Take this every 6 hours.  If you have additional pain then take 500 mg of the tylenol.  Only take the Norco after you have tried these two. 5. Miralax or stool softener of choice: Take this according to the bottle if you take the [place] Call your surgeon's office if any of the following occur: . Fever 101 degrees F or greater . Excessive bleeding or fluid from the incision site. . Pain that increases over time without aid from the medications . Redness, warmth, or pus draining from incision sites . Persistent nausea or inability to take in liquids . Severe misshapen area that underwent the operation.  [facility]ist  What is the benefit of having a drain?  During surgery your tissue layers are separated.  This raw surface stimulates your body to fill the space with serous fluid.  This is normal but you don't want that fluid to collect and prevent healing.  A fluid collection can also become infected.  The Jackson-Pratt (JP) drain is used to eliminate this collection of fluid and allow the tissue to heal together.    Jackson-Pratt (JP) bulb    How to care for your drainage and suction unit at home Your drainage catheter will be connected to a collection device. The vacuum caused when the device is compressed allows drainage to collect in the device.    Wendee Copp your hands with soap and water before and after touching the system. . Empty the JP drain every 12 hours once you get home from your procedure. . Record the fluid amount on the record sheet included. . Start with stripping the drain tube to push the clots or excess fluid to the bulb.  Do this by pinching the tube with one hand near your skin.  Then with the other hand squeeze the tubing and work it toward the bulb.  This should be done several times a day.  This may collapse the tube which will correct on its own.   . Use a  safety pin to attach your collection device to your clothing so there is no tension on the insertion site.   . If you have drainage at the skin insertion site, you can apply a gauze dressing and secure it with tape. . If the drain falls out, apply a gauze dressing over the drain insertion site and secure with tape.   To empty the collection device:   . Release the stopper on the top of the collection unit (bulb).  Signa Kell contents into a measuring container such as a plastic medicine cup.  . Record the day and amount of drainage on the attached sheet. Marland Kitchen  This should be done at least twice a day.    To compress the Jackson-Pratt Bulb:  . Release the stopper at the top of the bulb. Marland Kitchen Squeeze the bulb tightly in your fist, squeezing air out of the bulb.  . Replace the stopper while the bulb is compressed.  . Be careful not to spill the contents when squeezing the bulb. . The drainage will start bright red and turn to pink and then yellow with time. . IMPORTANT: If the bulb is not squeezed before adding the stopper it will not draw out the fluid.  Care for the JP drain site and your skin daily:  . You may shower three days after surgery. . Secure the drain to a ribbon or cloth around your waist while showering so it does not pull out while showering. . Be sure your hands are cleaned with soap and water. . Use a clean wet cotton swab to clean the skin around the drain site.  . Use another cotton swab to place Vaseline or antibiotic ointment on the skin around the drain.     Contact your physician if any of the following occur:  Marland Kitchen The fluid in the bulb becomes cloudy. . Your temperature is greater than 101.4.  Marland Kitchen The incision opens. . If you have drainage at the skin insertion site, you can apply a gauze dressing and secure it with tape. . If the drain falls out, apply a gauze dressing over the drain insertion site and secure with tape.  . You will usually have more drainage when you are active  than while you rest or are asleep. If the drainage increases significantly or is bloody call the physician                             Bring this record with you to each office visit Date  Drainage Volume  Date   Drainage volume                                                                                                                                                                                           AMBULATORY SURGERY  DISCHARGE INSTRUCTIONS   1) The drugs that you were given will stay in your system until tomorrow so for the next 24 hours you should not:  A) Drive an automobile B) Make any legal decisions C) Drink any alcoholic beverage   2) You may resume regular meals tomorrow.  Today it is better to start with liquids and gradually work up to solid foods.  You may eat anything you  but it is better to start with liquids, then soup and crackers, and gradually work up to solid foods.   3) Please notify your doctor immediately if you have any unusual bleeding, trouble breathing, redness and pain at the surgery site, drainage, fever, or pain not relieved by medication.    4) Additional Instructions:        Please contact your physician with any problems or Same Day Surgery at [phone], Monday through Friday 6 am to 4 pm, or [place] [facility] number at [phone]. 

## 2020-06-05 NOTE — Interval H&P Note (Signed)
History and Physical Interval Note:  06/05/2020 9:56 AM  Megan Vega  has presented today for surgery, with the diagnosis of Status post Breast Reconstruction, Seroma of breast.  The various methods of treatment have been discussed with the patient and family. After consideration of risks, benefits and other options for treatment, the patient has consented to  Procedure(s): TISSUE EXPANDER EXCHANGE (Right) as a surgical intervention.  The patient's history has been reviewed, patient examined, no change in status, stable for surgery.  I have reviewed the patient's chart and labs.  Questions were answered to the patient's satisfaction.     Loel Lofty Paullette Mckain

## 2020-06-05 NOTE — Op Note (Signed)
DATE OF OPERATION: 06/05/2020  LOCATION:  [facility]  PREOPERATIVE DIAGNOSIS: Right breast seroma after reconstruction  POSTOPERATIVE DIAGNOSIS: Same  PROCEDURE: Removal of right breast expander and excision of 1 x 2 cm wound  SURGEON: Kris No Sanger Latrail Pounders, DO  ASSISTANT: Roetta Sessions, PA  EBL: 5 cc  CONDITION: Stable  COMPLICATIONS: None  INDICATION: The patient, Megan Vega, is a 64 y.o. female born on [birth year], is here for treatment right breast seroma after reconstruction.  The patient has been draining fluid from the area and the decision was made to remove the expander and allow the pocket to heal.   PROCEDURE DETAILS:  The patient was seen prior to surgery and marked.  The IV antibiotics were given. The patient was taken to the operating room and given a general anesthetic. A standard time out was performed and all information was confirmed by those in the room. SCDs were placed.   The patient's chest was prepped with Betadine and draped in the usual sterile fashion.  A 10 blade was used to make an incision on the right medial breast.  This included the open area that was medial.  This 1 x 2 cm wound was excised.  There was noted to have separation of the latissimus muscle from the inframammary fold.  This may have contributed to the opening of the skin.  The expander was deflated and removed.  The copious amount of antibiotic solution and saline was used to to clean the pocket.  There was no purulence noted.  There was some soft fibrous tissue as expected.  This was all removed with a pair of pickups and with the ABD.  A drain was then placed and secured to the chest wall with a 4-0 silk.  Cultures were obtained prior to irrigating the pocket.  The deep layers were closed with 3-0 Monocryl followed by a 4 oh to 5-0 Monocryl. A combination of interrupted sutures and running sutures were used.  100 cc was removed from the left breast expander.  I was also able to  aspirate 10 cc of yellow drainage.  This was also sent for Gram stain culture and sensitivity.  Dermabond was applied with a sterile dressing.  The patient was allowed to wake up and taken to recovery room in stable condition at the end of the case. The family was notified at the end of the case.   The advanced practice practitioner (APP) assisted throughout the case.  The APP was essential in retraction and counter traction when needed to make the case progress smoothly.  This retraction and assistance made it possible to see the tissue plans for the procedure.  The assistance was needed for blood control, tissue re-approximation and assisted with closure of the incision site.

## 2020-06-05 NOTE — Transfer of Care (Signed)
Immediate Anesthesia Transfer of Care Note  Patient: Megan Vega  Procedure(s) Performed: TISSUE EXPANDER EXCHANGE (Right Breast)  Patient Location: PACU  Anesthesia Type:General  Level of Consciousness: drowsy  Airway & Oxygen Therapy: Patient Spontanous Breathing and Patient connected to face mask oxygen  Post-op Assessment: Report given to RN and Post -op Vital signs reviewed and stable  Post vital signs: Reviewed and stable  Last Vitals:  Vitals Value Taken Time  BP 92/55 06/05/20 1113  Temp    Pulse 60 06/05/20 1115  Resp 10 06/05/20 1115  SpO2 100 % 06/05/20 1115  Vitals shown include unvalidated device data.  Last Pain:  Vitals:   06/05/20 0805  TempSrc: Temporal  PainSc: 0-No pain         Complications: No complications documented.

## 2020-06-06 ENCOUNTER — Encounter: Payer: Self-pay | Admitting: Plastic Surgery

## 2020-06-10 LAB — AEROBIC/ANAEROBIC CULTURE W GRAM STAIN (SURGICAL/DEEP WOUND)

## 2020-06-12 NOTE — Progress Notes (Addendum)
Subjective:     Patient ID: Megan Vega, female    DOB: Jun 28, 1956, 64 y.o.   MRN: [id number]  Chief Complaint  [name]    HPI: The patient is a 64 y.o. female here for follow-up after undergoing removal of right breast tissue expander and excision of 1 x 2 cm wound on 06/05/2020 with Dr. Marla Roe. Cultures were taken during surgery showing moderate Diphtheroids (Corynebacterium species).   Right breast incision is healing nicely, C/D/I.  No signs of infection, redness, drainage, seroma/hematoma.  JP drain is in place with serosanguineous fluid in the bulb.  Patient reports less than 10 to 15 cc per 24-hour period for the last several days.  Left breast incision has opened up exposing the tissue expander.  Skin around the opening is very red.  Patient denies fever, chest pain, nausea/vomiting, shortness of breath.  Review of Systems  Constitutional: Negative for chills and fever.  Respiratory: Negative for shortness of breath.   Cardiovascular: Negative for chest pain and leg swelling.  Gastrointestinal: Negative for constipation, diarrhea, nausea and vomiting.  Skin: Positive for color change (left breast) and wound (left breast skin open and draining fluid). Negative for pallor and rash.     Objective:   Vital Signs BP 110/76 (BP Location: Left Arm, Patient Position: Sitting, Cuff Size: Normal)   Pulse 87   Temp 98.5 F (36.9 C) (Oral)   SpO2 98%  Vital Signs and Nursing Note Reviewed  Physical Exam Constitutional:      General: She is not in acute distress.    Appearance: Normal appearance. She is normal weight.  HENT:     Head: Normocephalic and atraumatic.  Eyes:     Extraocular Movements: Extraocular movements intact.  Cardiovascular:     Rate and Rhythm: Normal rate.  Pulmonary:     Effort: Pulmonary effort is normal.  Chest:     Comments: Right breast incision healing well, c/d/i. No signs of infection, redness, drainage,  seroma/hematoma.  JP drain in place with serosanguineous fluid in bulb.  Patient reports less than 10 to 15 cc per 24-hour.  Over the last several days.  Left breast incision has opened up exposing tissue expander.  Drainage present.  Skin around opening is red.  Musculoskeletal:        General: Normal range of motion.     Cervical back: Normal range of motion.  Skin:    General: Skin is warm and dry.     Coloration: Skin is not pale.     Findings: No erythema or rash.  Neurological:     Mental Status: She is alert and oriented to person, place, and time.     Gait: Gait is intact.  Psychiatric:        Mood and Affect: Mood and affect normal.        Behavior: Behavior normal.        Thought Content: Thought content normal.        Cognition and Memory: Memory normal.        Judgment: Judgment normal.       Assessment/Plan:     ICD-10-CM   1. S/P mastectomy, bilateral  Z90.13     Left breast incision has opened exposing the tissue expander.  Plan for surgery tomorrow to remove less tissue expander and allow time for her body to heal.  Continue current antibiotic regimen.  Use gauze to absorb drainage until surgery tomorrow.  Call office with any  questions/concerns.  Sent RX for pain medication. Extended Bactrim Rx.  The [place] was signed into law in 2016 which includes the topic of electronic health records.  This provides immediate access to information in MyChart.  This includes consultation notes, operative notes, office notes, lab results and pathology reports.  If you have any questions about what you read please let us know at your next visit or call us at the office.  We are right here with you.   Threasa Heads, PA-C 06/13/2020, 2:11 PM

## 2020-06-13 ENCOUNTER — Other Ambulatory Visit: Payer: Self-pay

## 2020-06-13 ENCOUNTER — Encounter: Payer: Self-pay | Admitting: Plastic Surgery

## 2020-06-13 ENCOUNTER — Ambulatory Visit (INDEPENDENT_AMBULATORY_CARE_PROVIDER_SITE_OTHER): Payer: 59 | Admitting: Plastic Surgery

## 2020-06-13 ENCOUNTER — Encounter (HOSPITAL_COMMUNITY): Payer: Self-pay | Admitting: Plastic Surgery

## 2020-06-13 ENCOUNTER — Other Ambulatory Visit (HOSPITAL_COMMUNITY)
Admission: RE | Admit: 2020-06-13 | Discharge: 2020-06-13 | Disposition: A | Discharge status: Home / Self Care | Payer: 59 | Source: Ambulatory Visit | Attending: Plastic Surgery | Admitting: Plastic Surgery

## 2020-06-13 VITALS — BP 110/76 | HR 87 | Temp 98.5°F

## 2020-06-13 DIAGNOSIS — Z9013 Acquired absence of bilateral breasts and nipples: Secondary | ICD-10-CM

## 2020-06-13 DIAGNOSIS — Z20822 Contact with and (suspected) exposure to covid-19: Secondary | ICD-10-CM | POA: Insufficient documentation

## 2020-06-13 DIAGNOSIS — Z01812 Encounter for preprocedural laboratory examination: Secondary | ICD-10-CM | POA: Diagnosis present

## 2020-06-13 LAB — SARS CORONAVIRUS 2 (TAT 6-24 HRS): SARS Coronavirus 2: NEGATIVE

## 2020-06-13 NOTE — Progress Notes (Signed)
Denies chest pain, shortness of breath, cardiac test, or cardiology visit. Educated on Environmental manager. Patient reports being in quarantine since COVID test.

## 2020-06-14 ENCOUNTER — Ambulatory Visit (HOSPITAL_COMMUNITY): Payer: 59 | Admitting: Anesthesiology

## 2020-06-14 ENCOUNTER — Other Ambulatory Visit (INDEPENDENT_AMBULATORY_CARE_PROVIDER_SITE_OTHER): Payer: 59 | Admitting: Plastic Surgery

## 2020-06-14 ENCOUNTER — Ambulatory Visit (HOSPITAL_COMMUNITY)
Admission: RE | Admit: 2020-06-14 | Discharge: 2020-06-14 | Disposition: A | Discharge status: Home / Self Care | Payer: 59 | Attending: Plastic Surgery | Admitting: Plastic Surgery

## 2020-06-14 ENCOUNTER — Encounter (HOSPITAL_COMMUNITY)
Admission: RE | Disposition: A | Discharge status: Home / Self Care | Payer: Self-pay | Source: Home / Self Care | Attending: Plastic Surgery

## 2020-06-14 ENCOUNTER — Other Ambulatory Visit: Payer: Self-pay

## 2020-06-14 ENCOUNTER — Encounter (HOSPITAL_COMMUNITY): Payer: Self-pay | Admitting: Plastic Surgery

## 2020-06-14 DIAGNOSIS — S21002A Unspecified open wound of left breast, initial encounter: Secondary | ICD-10-CM

## 2020-06-14 DIAGNOSIS — Z87891 Personal history of nicotine dependence: Secondary | ICD-10-CM | POA: Insufficient documentation

## 2020-06-14 DIAGNOSIS — Z9013 Acquired absence of bilateral breasts and nipples: Secondary | ICD-10-CM | POA: Diagnosis not present

## 2020-06-14 DIAGNOSIS — Z9221 Personal history of antineoplastic chemotherapy: Secondary | ICD-10-CM | POA: Diagnosis not present

## 2020-06-14 DIAGNOSIS — Y838 Other surgical procedures as the cause of abnormal reaction of the patient, or of later complication, without mention of misadventure at the time of the procedure: Secondary | ICD-10-CM | POA: Diagnosis not present

## 2020-06-14 DIAGNOSIS — T8549XA Other mechanical complication of breast prosthesis and implant, initial encounter: Secondary | ICD-10-CM

## 2020-06-14 DIAGNOSIS — Z853 Personal history of malignant neoplasm of breast: Secondary | ICD-10-CM | POA: Diagnosis not present

## 2020-06-14 DIAGNOSIS — T8131XA Disruption of external operation (surgical) wound, not elsewhere classified, initial encounter: Secondary | ICD-10-CM | POA: Insufficient documentation

## 2020-06-14 HISTORY — PX: TISSUE EXPANDER PLACEMENT: SHX2530

## 2020-06-14 HISTORY — DX: Malignant neoplasm of unspecified site of unspecified female breast: C50.919

## 2020-06-14 LAB — CBC
HCT: 32.7 % — ABNORMAL LOW (ref 36.0–46.0)
Hemoglobin: 10.3 g/dL — ABNORMAL LOW (ref 12.0–15.0)
MCH: 26.7 pg (ref 26.0–34.0)
MCHC: 31.5 g/dL (ref 30.0–36.0)
MCV: 84.7 fL (ref 80.0–100.0)
Platelets: 340 10*3/uL (ref 150–400)
RBC: 3.86 MIL/uL — ABNORMAL LOW (ref 3.87–5.11)
RDW: 13.8 % (ref 11.5–15.5)
WBC: 9 10*3/uL (ref 4.0–10.5)
nRBC: 0 % (ref 0.0–0.2)

## 2020-06-14 LAB — BASIC METABOLIC PANEL
Anion gap: 9 (ref 5–15)
BUN: 7 mg/dL — ABNORMAL LOW (ref 8–23)
CO2: 24 mmol/L (ref 22–32)
Calcium: 8.6 mg/dL — ABNORMAL LOW (ref 8.9–10.3)
Chloride: 100 mmol/L (ref 98–111)
Creatinine, Ser: 0.9 mg/dL (ref 0.44–1.00)
GFR calc Af Amer: >60 mL/min (ref >60)
GFR calc non Af Amer: >60 mL/min (ref >60)
Glucose, Bld: 108 mg/dL — ABNORMAL HIGH (ref 70–99)
Potassium: 4.7 mmol/L (ref 3.5–5.1)
Sodium: 133 mmol/L — ABNORMAL LOW (ref 135–145)

## 2020-06-14 SURGERY — INSERTION, TISSUE EXPANDER
Anesthesia: General | Site: Breast | Laterality: Left

## 2020-06-14 MED ORDER — MIDAZOLAM HCL 5 MG/5ML IJ SOLN
INTRAMUSCULAR | Status: DC | PRN
  Administered 2020-06-14: 2 mg via INTRAVENOUS

## 2020-06-14 MED ORDER — 0.9 % SODIUM CHLORIDE (POUR BTL) OPTIME
TOPICAL | Status: DC | PRN
  Administered 2020-06-14: 1000 mL

## 2020-06-14 MED ORDER — MIDAZOLAM HCL 2 MG/2ML IJ SOLN
INTRAMUSCULAR | Status: AC
  Filled 2020-06-14: qty 2

## 2020-06-14 MED ORDER — LACTATED RINGERS IV SOLN: INTRAVENOUS | Status: DC | PRN

## 2020-06-14 MED ORDER — PHENYLEPHRINE 40 MCG/ML (10ML) SYRINGE FOR IV PUSH (FOR BLOOD PRESSURE SUPPORT)
PREFILLED_SYRINGE | INTRAVENOUS | Status: DC | PRN
  Administered 2020-06-14 (×4): 80 ug via INTRAVENOUS

## 2020-06-14 MED ORDER — OXYCODONE HCL 5 MG/5ML PO SOLN: 5.0000 mg | Freq: Once | ORAL | Status: DC | PRN

## 2020-06-14 MED ORDER — PROPOFOL 10 MG/ML IV BOLUS
INTRAVENOUS | Status: DC | PRN
  Administered 2020-06-14: 125 mg via INTRAVENOUS

## 2020-06-14 MED ORDER — LIDOCAINE 2% (20 MG/ML) 5 ML SYRINGE
INTRAMUSCULAR | Status: DC | PRN
  Administered 2020-06-14: 50 mg via INTRAVENOUS

## 2020-06-14 MED ORDER — FENTANYL CITRATE (PF) 100 MCG/2ML IJ SOLN: 25.0000 ug | INTRAMUSCULAR | Status: DC | PRN

## 2020-06-14 MED ORDER — ORAL CARE MOUTH RINSE: 15.0000 mL | Freq: Once | OROMUCOSAL | Status: AC

## 2020-06-14 MED ORDER — FENTANYL CITRATE (PF) 250 MCG/5ML IJ SOLN
INTRAMUSCULAR | Status: DC | PRN
  Administered 2020-06-14 (×5): 50 ug via INTRAVENOUS

## 2020-06-14 MED ORDER — HYDROCODONE-ACETAMINOPHEN 5-325 MG PO TABS: 1.0000 | ORAL_TABLET | Freq: Three times a day (TID) | ORAL | 0 refills | Status: AC | PRN

## 2020-06-14 MED ORDER — ACETAMINOPHEN 160 MG/5ML PO SOLN: 325.0000 mg | ORAL | Status: DC | PRN

## 2020-06-14 MED ORDER — FENTANYL CITRATE (PF) 250 MCG/5ML IJ SOLN
INTRAMUSCULAR | Status: AC
  Filled 2020-06-14: qty 5

## 2020-06-14 MED ORDER — ONDANSETRON HCL 4 MG/2ML IJ SOLN
INTRAMUSCULAR | Status: DC | PRN
  Administered 2020-06-14: 4 mg via INTRAVENOUS

## 2020-06-14 MED ORDER — CEFAZOLIN SODIUM-DEXTROSE 2-3 GM-%(50ML) IV SOLR
INTRAVENOUS | Status: DC | PRN
  Administered 2020-06-14: 2 g via INTRAVENOUS

## 2020-06-14 MED ORDER — OXYCODONE HCL 5 MG PO TABS: 5.0000 mg | ORAL_TABLET | Freq: Once | ORAL | Status: DC | PRN

## 2020-06-14 MED ORDER — ACETAMINOPHEN 325 MG PO TABS: 325.0000 mg | ORAL_TABLET | ORAL | Status: DC | PRN

## 2020-06-14 MED ORDER — ONDANSETRON HCL 4 MG/2ML IJ SOLN: 4.0000 mg | Freq: Once | INTRAMUSCULAR | Status: DC | PRN

## 2020-06-14 MED ORDER — SULFAMETHOXAZOLE-TRIMETHOPRIM 800-160 MG PO TABS: 1.0000 | ORAL_TABLET | Freq: Two times a day (BID) | ORAL | 0 refills | Status: AC

## 2020-06-14 MED ORDER — HEMOSTATIC AGENTS (NO CHARGE) OPTIME
TOPICAL | Status: DC | PRN
  Administered 2020-06-14: 1 via TOPICAL

## 2020-06-14 MED ORDER — CHLORHEXIDINE GLUCONATE 0.12 % MT SOLN
15.0000 mL | Freq: Once | OROMUCOSAL | Status: AC
  Administered 2020-06-14: 15 mL via OROMUCOSAL
  Filled 2020-06-14: qty 15

## 2020-06-14 MED ORDER — LACTATED RINGERS IV SOLN: INTRAVENOUS | Status: DC

## 2020-06-14 MED ORDER — MEPERIDINE HCL 25 MG/ML IJ SOLN: 6.2500 mg | INTRAMUSCULAR | Status: DC | PRN

## 2020-06-14 MED ORDER — SODIUM CHLORIDE 0.9 % IV SOLN
INTRAVENOUS | Status: DC | PRN
  Administered 2020-06-14: 500 mL

## 2020-06-14 MED ORDER — DIPHENHYDRAMINE HCL 50 MG/ML IJ SOLN
INTRAMUSCULAR | Status: DC | PRN
  Administered 2020-06-14: 6.25 mg via INTRAVENOUS

## 2020-06-14 MED ORDER — PROPOFOL 10 MG/ML IV BOLUS
INTRAVENOUS | Status: AC
  Filled 2020-06-14: qty 20

## 2020-06-14 MED ORDER — THROMBIN 5000 UNITS EX SOLR
CUTANEOUS | Status: AC
  Filled 2020-06-14: qty 5000

## 2020-06-14 MED ORDER — LIDOCAINE-EPINEPHRINE 1 %-1:100000 IJ SOLN
INTRAMUSCULAR | Status: AC
  Filled 2020-06-14: qty 1

## 2020-06-14 MED ORDER — ALBUMIN HUMAN 5 % IV SOLN: INTRAVENOUS | Status: DC | PRN

## 2020-06-14 MED ORDER — CEFAZOLIN SODIUM-DEXTROSE 2-4 GM/100ML-% IV SOLN: 2.0000 g | INTRAVENOUS | Status: DC

## 2020-06-14 MED ORDER — CEFAZOLIN SODIUM-DEXTROSE 2-4 GM/100ML-% IV SOLN
2.0000 g | INTRAVENOUS | Status: DC
  Filled 2020-06-14: qty 100

## 2020-06-14 MED ORDER — DEXAMETHASONE SODIUM PHOSPHATE 10 MG/ML IJ SOLN
INTRAMUSCULAR | Status: DC | PRN
  Administered 2020-06-14: 10 mg via INTRAVENOUS

## 2020-06-14 MED ORDER — CHLORHEXIDINE GLUCONATE CLOTH 2 % EX PADS: 6.0000 | MEDICATED_PAD | Freq: Once | CUTANEOUS | Status: DC

## 2020-06-14 SURGICAL SUPPLY — 50 items
ADH SKN CLS APL DERMABOND .7 (GAUZE/BANDAGES/DRESSINGS) ×1
APL PRP STRL LF DISP 70% ISPRP (MISCELLANEOUS) ×1
BAG DECANTER FOR FLEXI CONT (MISCELLANEOUS) ×2 IMPLANT
BINDER BREAST LRG (GAUZE/BANDAGES/DRESSINGS) IMPLANT
BINDER BREAST XLRG (GAUZE/BANDAGES/DRESSINGS) IMPLANT
BIOPATCH RED 1 DISK 7.0 (GAUZE/BANDAGES/DRESSINGS) ×2 IMPLANT
CANISTER SUCT 3000ML PPV (MISCELLANEOUS) ×2 IMPLANT
CHLORAPREP W/TINT 26 (MISCELLANEOUS) ×2 IMPLANT
COVER SURGICAL LIGHT HANDLE (MISCELLANEOUS) ×2 IMPLANT
COVER WAND RF STERILE (DRAPES) ×2 IMPLANT
DERMABOND ADVANCED (GAUZE/BANDAGES/DRESSINGS) ×1
DERMABOND ADVANCED .7 DNX12 (GAUZE/BANDAGES/DRESSINGS) ×1 IMPLANT
DRAIN CHANNEL 19F RND (DRAIN) ×3 IMPLANT
DRAPE HALF SHEET 40X57 (DRAPES) ×4 IMPLANT
DRAPE ORTHO SPLIT 77X108 STRL (DRAPES) ×4
DRAPE SURG 17X23 STRL (DRAPES) ×8 IMPLANT
DRAPE SURG ORHT 6 SPLT 77X108 (DRAPES) ×2 IMPLANT
DRAPE WARM FLUID 44X44 (DRAPES) ×2 IMPLANT
DRSG MEPILEX BORDER 4X8 (GAUZE/BANDAGES/DRESSINGS) ×1 IMPLANT
DRSG PAD ABDOMINAL 8X10 ST (GAUZE/BANDAGES/DRESSINGS) ×4 IMPLANT
ELECT BLADE 4.0 EZ CLEAN MEGAD (MISCELLANEOUS) ×2
ELECT REM PT RETURN 9FT ADLT (ELECTROSURGICAL) ×2
ELECTRODE BLDE 4.0 EZ CLN MEGD (MISCELLANEOUS) ×1 IMPLANT
ELECTRODE REM PT RTRN 9FT ADLT (ELECTROSURGICAL) ×1 IMPLANT
EVACUATOR SILICONE 100CC (DRAIN) ×3 IMPLANT
GAUZE SPONGE 4X4 12PLY STRL (GAUZE/BANDAGES/DRESSINGS) ×2 IMPLANT
GEL WOUND SURGX STRL 7.5 (MISCELLANEOUS) ×1 IMPLANT
GLOVE BIO SURGEON STRL SZ 6.5 (GLOVE) ×2 IMPLANT
GOWN STRL REUS W/ TWL LRG LVL3 (GOWN DISPOSABLE) ×2 IMPLANT
GOWN STRL REUS W/TWL LRG LVL3 (GOWN DISPOSABLE) ×4
KIT BASIN OR (CUSTOM PROCEDURE TRAY) ×2 IMPLANT
KIT TURNOVER KIT B (KITS) ×2 IMPLANT
NS IRRIG 1000ML POUR BTL (IV SOLUTION) ×4 IMPLANT
PACK GENERAL/GYN (CUSTOM PROCEDURE TRAY) ×2 IMPLANT
PAD ARMBOARD 7.5X6 YLW CONV (MISCELLANEOUS) ×2 IMPLANT
PIN SAFETY STERILE (MISCELLANEOUS) ×2 IMPLANT
SET ASEPTIC TRANSFER (MISCELLANEOUS) IMPLANT
SPONGE SURGIFOAM ABS GEL 12-7 (HEMOSTASIS) ×3 IMPLANT
STAPLER VISISTAT 35W (STAPLE) IMPLANT
SUT MNCRL AB 3-0 PS2 18 (SUTURE) ×2 IMPLANT
SUT MON AB 5-0 PS2 18 (SUTURE) ×3 IMPLANT
SUT PDS AB 2-0 CT1 27 (SUTURE) ×8 IMPLANT
SUT PDS AB 3-0 SH 27 (SUTURE) IMPLANT
SUT SILK 3 0 SH 30 (SUTURE) ×2 IMPLANT
SUT VIC AB 3-0 SH 27 (SUTURE) ×6
SUT VIC AB 3-0 SH 27X BRD (SUTURE) ×3 IMPLANT
SUT VICRYL 4-0 PS2 18IN ABS (SUTURE) ×4 IMPLANT
TOWEL GREEN STERILE (TOWEL DISPOSABLE) ×2 IMPLANT
TOWEL GREEN STERILE FF (TOWEL DISPOSABLE) ×2 IMPLANT
TRAY FOLEY MTR SLVR 14FR STAT (SET/KITS/TRAYS/PACK) IMPLANT

## 2020-06-14 NOTE — Discharge Instructions (Signed)
INSTRUCTIONS FOR AFTER SURGERY   You will likely have some questions about what to expect following your operation.  The following information will help you and your family understand what to expect when you are discharged from the hospital.  Following these guidelines will help ensure a smooth recovery and reduce risks of complications.  Postoperative instructions include information on: diet, wound care, medications and physical activity.  AFTER SURGERY Expect to go home after the procedure.  In some cases, you may need to spend one night in the hospital for observation.  DIET This surgery does not require a specific diet.  However, I have to mention that the healthier you eat the better your body can start healing. It is important to increasing your protein intake.  This means limiting the foods with added sugar.  Focus on fruits and vegetables and some meat.  If you have any liposuction during your procedure be sure to drink water.  If your urine is bright yellow, then it is concentrated, and you need to drink more water.  As a general rule after surgery, you should have 8 ounces of water every hour while awake.  If you find you are persistently nauseated or unable to take in liquids let us know.  NO TOBACCO USE or EXPOSURE.  This will slow your healing process and increase the risk of a wound.  WOUND CARE If you have a drain: Clean with baby wipes until the drain is removed.   We close your incision to leave the smallest and best-looking scar.  SurgX cream can be applied daily. We ask you to wear your binder or sports bra for the first 6 weeks around the clock, including while sleeping. This provides added comfort and helps reduce the fluid accumulation at the surgery site.  ACTIVITY No heavy lifting until cleared by the doctor.  It is OK to walk and climb stairs. In fact, moving your legs is very important to decrease your risk of a blood clot.  It will also help keep you from getting  deconditioned.  Every 1 to 2 hours get up and walk for 5 minutes. This will help with a quicker recovery back to normal.  Let pain be your guide so you don't do too much.  NO, you cannot do the spring cleaning and don't plan on taking care of anyone else.  This is your time for TLC.   WORK Everyone returns to work at different times. As a rough guide, most people take at least 1 - 2 weeks off prior to returning to work. If you need documentation for your job, bring the forms to your postoperative follow up visit.  DRIVING Arrange for someone to bring you home from the hospital.  You may be able to drive a few days after surgery but not while taking any narcotics or valium.  BOWEL MOVEMENTS Constipation can occur after anesthesia and while taking pain medication.  It is important to stay ahead for your comfort.  We recommend taking Milk of Magnesia (2 tablespoons; twice a day) while taking the pain pills.  SEROMA This is fluid your body tried to put in the surgical site.  This is normal but if it creates excessive pain and swelling let us know.  It usually decreases in a few weeks.  MEDICATIONS and PAIN CONTROL At your preoperative visit for you history and physical you were given the following medications: 1. An antibiotic: Start this medication when you get home and take according to  the instructions on the bottle. 2. Zofran 4 mg:  This is to treat nausea and vomiting.  You can take this every 6 hours as needed and only if needed. 3. Norco (hydrocodone/acetaminophen) 5/325 mg:  This is only to be used after you have taken the motrin or the tylenol. Every 8 hours as needed. Over the counter Medication to take: 4. Ibuprofen (Motrin) 600 mg:  Take this every 6 hours.  If you have additional pain then take 500 mg of the tylenol.  Only take the Norco after you have tried these two. 5. Miralax or stool softener of choice: Take this according to the bottle if you take the [place] Call  your surgeon's office if any of the following occur: . Fever 101 degrees F or greater . Excessive bleeding or fluid from the incision site. . Pain that increases over time without aid from the medications . Redness, warmth, or pus draining from incision sites . Persistent nausea or inability to take in liquids . Severe misshapen area that underwent the operation.

## 2020-06-14 NOTE — Anesthesia Postprocedure Evaluation (Signed)
Anesthesia Post Note  Patient: Megan Vega  Procedure(s) Performed: TISSUE EXPANDER REMOVAL (Left Breast)     Patient location during evaluation: PACU Anesthesia Type: General Level of consciousness: awake and alert, patient cooperative and oriented Pain management: pain level controlled Vital Signs Assessment: post-procedure vital signs reviewed and stable Respiratory status: spontaneous breathing, nonlabored ventilation and respiratory function stable Cardiovascular status: blood pressure returned to baseline and stable Postop Assessment: no apparent nausea or vomiting Anesthetic complications: no   No complications documented.  Last Vitals:  Vitals:   06/14/20 2015 06/14/20 2030  BP: 109/69 (!) 110/51  Pulse: 102 97  Resp: 15 17  Temp:    SpO2: 93% 93%    Last Pain:  Vitals:   06/14/20 2004  TempSrc:   PainSc: 0-No pain                 Kedron Uno,E. Ruger Saxer

## 2020-06-14 NOTE — Progress Notes (Signed)
Sent additional 7 days of Bactrim to pharmacy.  (Patient's current Rx completes today)

## 2020-06-14 NOTE — Interval H&P Note (Signed)
History and Physical Interval Note:  06/14/2020 6:44 PM  Megan Vega  has presented today for surgery, with the diagnosis of post breast reconstruction.  The various methods of treatment have been discussed with the patient and family. After consideration of risks, benefits and other options for treatment, the patient has consented to  Procedure(s) with comments: TISSUE EXPANDER REMOVAL (Left) - 45 min, please as a surgical intervention.  The patient's history has been reviewed, patient examined, no change in status, stable for surgery.  I have reviewed the patient's chart and labs.  Questions were answered to the patient's satisfaction.     Loel Lofty Megan Vega

## 2020-06-14 NOTE — Transfer of Care (Signed)
Immediate Anesthesia Transfer of Care Note  Patient: Megan Vega  Procedure(s) Performed: TISSUE EXPANDER REMOVAL (Left Breast)  Patient Location: PACU  Anesthesia Type:General  Level of Consciousness: awake, alert  and oriented  Airway & Oxygen Therapy: Patient Spontanous Breathing  Post-op Assessment: Report given to RN and Post -op Vital signs reviewed and stable  Post vital signs: Reviewed and stable  Last Vitals:  Vitals Value Taken Time  BP 109/69 06/14/20 2006  Temp 37.1 C 06/14/20 2004  Pulse 102 06/14/20 2011  Resp 16 06/14/20 2011  SpO2 94 % 06/14/20 2011  Vitals shown include unvalidated device data.  Last Pain:  Vitals:   06/14/20 2004  TempSrc:   PainSc: 0-No pain      Patients Stated Pain Goal: 3 (09/32/67 1245)  Complications: No complications documented.

## 2020-06-14 NOTE — Anesthesia Procedure Notes (Signed)
Procedure Name: LMA Insertion Date/Time: 06/14/2020 7:09 PM Performed by: Jearld Pies, CRNA Pre-anesthesia Checklist: Patient identified, Emergency Drugs available, Suction available and Patient being monitored Patient Re-evaluated:Patient Re-evaluated prior to induction Oxygen Delivery Method: Circle System Utilized Preoxygenation: Pre-oxygenation with 100% oxygen Induction Type: IV induction Ventilation: Mask ventilation without difficulty LMA: LMA inserted LMA Size: 4.0 Number of attempts: 1 Airway Equipment and Method: Bite block Placement Confirmation: positive ETCO2 Tube secured with: Tape Dental Injury: Teeth and Oropharynx as per pre-operative assessment

## 2020-06-14 NOTE — Op Note (Signed)
DATE OF OPERATION: 06/14/2020  LOCATION: [facility]  PREOPERATIVE DIAGNOSIS: Left breast wound  POSTOPERATIVE DIAGNOSIS: Same  PROCEDURE: Removal of left breast expander  SURGEON: Lyndee Leo Sanger Marigold Mom, DO  ASSISTANT: Phoebe Sharps, PA  EBL: 5 cc  CONDITION: Stable  COMPLICATIONS: None  INDICATION: The patient, Megan Vega, is a 64 y.o. female born on 1955-12-29, is here for treatment of a left breast wound with exposure of the expander.   PROCEDURE DETAILS:  The patient was seen prior to surgery and marked.  The IV antibiotics were given. The patient was taken to the operating room and given a general anesthetic. A standard time out was performed and all information was confirmed by those in the room. SCDs were placed.   The left breast was prepped and draped.  The expander was removed.  The skin was excised in an ellipse for more harmonious and healthy closure.  This was an area of 8 cm long.  The tissue was very friable.  There was moderate amount of bleeding with the fibrous tissue.  For that reason thrombin was applied.  This did control the bleeding nicely.  The ADM was essentially gone.  The pocket was irrigated with saline and antibiotic solution.  A drain was placed and secured to the chest with the 4-0 Silk.  The deep layers were closed with the 4-0 Monocryl followed by the 5-0 Monocry with vertical mattress sutures for the skin closure.  The surgical screen was then applied.  A sterile dressing was placed.   The right breast drain was removed.  The patient was allowed to wake up and taken to recovery room in stable condition at the end of the case. The family was notified at the end of the case.   The advanced practice practitioner (APP) assisted throughout the case.  The APP was essential in retraction and counter traction when needed to make the case progress smoothly.  This retraction and assistance made it possible to see the tissue plans for the  procedure.  The assistance was needed for blood control, tissue re-approximation and assisted with closure of the incision site.

## 2020-06-14 NOTE — H&P (View-Only) (Signed)
Sent additional 7 days of Bactrim to pharmacy.  (Patient's current Rx completes today)

## 2020-06-14 NOTE — Addendum Note (Signed)
Addended by: Phoebe Sharps on: 06/14/2020 06:49 PM   Modules accepted: Orders

## 2020-06-14 NOTE — Anesthesia Preprocedure Evaluation (Signed)
Anesthesia Evaluation  Patient identified by MRN, date of birth, ID band Patient awake    Reviewed: Allergy & Precautions, NPO status , Patient's Chart, lab work & pertinent test results  History of Anesthesia Complications Negative for: history of anesthetic complications  Airway Mallampati: I  TM Distance: >3 FB Neck ROM: Full    Dental  (+) Dental Advisory Given, Teeth Intact   Pulmonary former smoker,  06/13/2020 SARS coronavirus NEG   breath sounds clear to auscultation       Cardiovascular (-) anginanegative cardio ROS   Rhythm:Regular Rate:Normal  '19' MUGA: Normal L ventricular ejection fraction of 63%, with normal LV wall motion   Neuro/Psych  Headaches,    GI/Hepatic negative GI ROS, Neg liver ROS,   Endo/Other  negative endocrine ROS  Renal/GU negative Renal ROS     Musculoskeletal   Abdominal   Peds  Hematology negative hematology ROS (+)   Anesthesia Other Findings Breast cancer: chemo  Reproductive/Obstetrics                             Anesthesia Physical Anesthesia Plan  ASA: II  Anesthesia Plan: General   Post-op Pain Management:    Induction: Intravenous  PONV Risk Score and Plan: 3 and Ondansetron and Dexamethasone  Airway Management Planned: LMA  Additional Equipment: None  Intra-op Plan:   Post-operative Plan:   Informed Consent: I have reviewed the patients History and Physical, chart, labs and discussed the procedure including the risks, benefits and alternatives for the proposed anesthesia with the patient or authorized representative who has indicated his/her understanding and acceptance.     Dental advisory given  Plan Discussed with: CRNA and Surgeon  Anesthesia Plan Comments: (Pt declines scopolamine patch)        Anesthesia Quick Evaluation

## 2020-06-15 ENCOUNTER — Encounter (HOSPITAL_COMMUNITY): Payer: Self-pay | Admitting: Plastic Surgery

## 2020-06-15 MED FILL — Thrombin For Soln 5000 Unit: CUTANEOUS | Qty: 5000 | Status: AC

## 2020-06-18 NOTE — H&P (View-Only) (Signed)
Subjective:     Patient ID: Megan Vega, female    DOB: 11-Feb-1956, 64 y.o.   MRN: [id number]  Chief Complaint  Patient presents with  . Post-op Follow-up    HPI: The patient is a 64 y.o. female here for follow-up after removal of left breast tissue expander. Patient presents today with her daughter. Reports she's feeling well overall today. Left breast incision is intact, clean and dry, dermabond present.  Drain in place on left side with serosanguinous fluid in bulb. Output is decreasing, but > 20 cc per day. Right breast incision has opened on inferior portion of latissimus flap ~ 1 mm opening draining clear reddish yellow fluid. No purulent discharge, no fevers, no tenderness or warmth to the touch.    SurgX cream was applied to right breast incision at time of surgery and patient applied it daily for 5 days as directed. She still has a good amount left in the tube and will hold onto it incase she needs it.  Review of Systems  Constitutional: Negative for chills and fever.  Respiratory: Negative for shortness of breath.   Cardiovascular: Negative for chest pain and leg swelling.  Gastrointestinal: Negative for constipation, diarrhea, nausea and vomiting.  Skin:       Right breast incision draining     Objective:   Vital Signs BP (!) 104/55 (BP Location: Left Arm, Patient Position: Sitting, Cuff Size: Large)   Pulse 83   Temp 98.4 F (36.9 C) (Oral)   LMP  (LMP Unknown)   SpO2 98%  Vital Signs and Nursing Note Reviewed  Physical Exam Constitutional:      General: She is not in acute distress.    Appearance: Normal appearance. She is normal weight.  HENT:     Head: Normocephalic and atraumatic.  Eyes:     Extraocular Movements: Extraocular movements intact.  Cardiovascular:     Rate and Rhythm: Normal rate.  Pulmonary:     Effort: Pulmonary effort is normal.  Chest:       Comments: Left incision intact, clean and dry. Dermabond present. Mild erythema bilateral  breasts. Right breast incision ~31mm opening on lateral medial incision of latissimus flap with clear serosanguinous drainage.  Mild bilateral swelling present.  Musculoskeletal:        General: Normal range of motion.     Cervical back: Normal range of motion.  Skin:    General: Skin is warm and dry.     Coloration: Skin is not pale.     Findings: No rash.  Neurological:     Mental Status: She is alert and oriented to person, place, and time.     Gait: Gait is intact.  Psychiatric:        Mood and Affect: Mood and affect normal.        Cognition and Memory: Memory normal.        Judgment: Judgment normal.    Pictures were obtained of the patient and placed in the chart with the patient's or guardian's permission.    Assessment/Plan:     ICD-10-CM   1. S/P mastectomy, bilateral  Z90.13    Continue Bactrim antibiotic until finished.  Apply Vaseline to opening along incision on right breast and cover with gauze. May shower avoiding direct water spray.  Follow up next week for drain removal and recheck. Call office with any questions/concerns.  The [place] was signed into law in 2016 which includes the topic of electronic health records.  This provides immediate access to information in MyChart.  This includes consultation notes, operative notes, office notes, lab results and pathology reports.  If you have any questions about what you read please let us know at your next visit or call us at the office.  We are right here with you.    Threasa Heads, PA-C 06/20/2020, 3:33 PM

## 2020-06-18 NOTE — Progress Notes (Signed)
Subjective:     Patient ID: Megan Vega, female    DOB: [birth year], 64 y.o.   MRN: [id number]  Chief Complaint  Patient presents with  . Post-op Follow-up    HPI: The patient is a 64 y.o. female here for follow-up after removal of left breast tissue expander. Patient presents today with her daughter. Reports she's feeling well overall today. Left breast incision is intact, clean and dry, dermabond present.  Drain in place on left side with serosanguinous fluid in bulb. Output is decreasing, but > 20 cc per day. Right breast incision has opened on inferior portion of latissimus flap ~ 1 mm opening draining clear reddish yellow fluid. No purulent discharge, no fevers, no tenderness or warmth to the touch.    SurgX cream was applied to right breast incision at time of surgery and patient applied it daily for 5 days as directed. She still has a good amount left in the tube and will hold onto it incase she needs it.  Review of Systems  Constitutional: Negative for chills and fever.  Respiratory: Negative for shortness of breath.   Cardiovascular: Negative for chest pain and leg swelling.  Gastrointestinal: Negative for constipation, diarrhea, nausea and vomiting.  Skin:       Right breast incision draining     Objective:   Vital Signs BP (!) 104/55 (BP Location: Left Arm, Patient Position: Sitting, Cuff Size: Large)   Pulse 83   Temp 98.4 F (36.9 C) (Oral)   LMP  (LMP Unknown)   SpO2 98%  Vital Signs and Nursing Note Reviewed  Physical Exam Constitutional:      General: She is not in acute distress.    Appearance: Normal appearance. She is normal weight.  HENT:     Head: Normocephalic and atraumatic.  Eyes:     Extraocular Movements: Extraocular movements intact.  Cardiovascular:     Rate and Rhythm: Normal rate.  Pulmonary:     Effort: Pulmonary effort is normal.  Chest:       Comments: Left incision intact, clean and dry. Dermabond present. Mild erythema bilateral  breasts. Right breast incision ~102mm opening on lateral medial incision of latissimus flap with clear serosanguinous drainage.  Mild bilateral swelling present.  Musculoskeletal:        General: Normal range of motion.     Cervical back: Normal range of motion.  Skin:    General: Skin is warm and dry.     Coloration: Skin is not pale.     Findings: No rash.  Neurological:     Mental Status: She is alert and oriented to person, place, and time.     Gait: Gait is intact.  Psychiatric:        Mood and Affect: Mood and affect normal.        Cognition and Memory: Memory normal.        Judgment: Judgment normal.    Pictures were obtained of the patient and placed in the chart with the patient's or guardian's permission.    Assessment/Plan:     ICD-10-CM   1. S/P mastectomy, bilateral  Z90.13    Continue Bactrim antibiotic until finished.  Apply Vaseline to opening along incision on right breast and cover with gauze. May shower avoiding direct water spray.  Follow up next week for drain removal and recheck. Call office with any questions/concerns.  The [employer] was signed into law in 2016 which includes the topic of electronic health records.  This provides immediate access to information in MyChart.  This includes consultation notes, operative notes, office notes, lab results and pathology reports.  If you have any questions about what you read please let us know at your next visit or call us at the office.  We are right here with you.    Threasa Heads, PA-C 06/20/2020, 3:33 PM

## 2020-06-20 ENCOUNTER — Other Ambulatory Visit: Payer: Self-pay

## 2020-06-20 ENCOUNTER — Ambulatory Visit (INDEPENDENT_AMBULATORY_CARE_PROVIDER_SITE_OTHER): Payer: 59 | Admitting: Plastic Surgery

## 2020-06-20 ENCOUNTER — Encounter: Payer: Self-pay | Admitting: Plastic Surgery

## 2020-06-20 VITALS — BP 104/55 | HR 83 | Temp 98.4°F

## 2020-06-20 DIAGNOSIS — Z9013 Acquired absence of bilateral breasts and nipples: Secondary | ICD-10-CM

## 2020-06-22 NOTE — Interval H&P Note (Signed)
History and Physical Interval Note:  06/22/2020 7:18 AM  Megan Vega  has presented today for surgery, with the diagnosis of post breast reconstruction.  The various methods of treatment have been discussed with the patient and family. After consideration of risks, benefits and other options for treatment, the patient has consented to  Procedure(s) with comments: TISSUE EXPANDER REMOVAL (Left) - 45 min, please as a surgical intervention.  The patient's history has been reviewed, patient examined, no change in status, stable for surgery.  I have reviewed the patient's chart and labs.  Questions were answered to the patient's satisfaction.     Loel Lofty Braison Snoke

## 2020-06-25 NOTE — Progress Notes (Signed)
[facility]  [address] [place], [place] [address] Phone: [phone]  Fax: [phone]   Clinic Day:  06/26/2020  Referring physician: Burnard Hawthorne, FNP  Chief Complaint: Megan Vega is a 64 y.o. female with IIIB right breast cancer and DCIS of the left breast who is seen for 4 month assessment.   HPI: The patient was last seen in the medical oncology clinic on 02/21/2020. At that time, she was recovering well from surgery.  Exam revealed post-operative changes.  Drains remained in place. Hematocrit was 41.3, hemoglobin 13.6, platelets 277,000, WBC 6,500. CMP was normal. CA 27.29 was 11.2. She received Zometa.  The patient has been followed by Phoebe Sharps, PA and Dr. Marla Roe. On 06/20/2020, she was feeling well. There was some drainage from her right breast incision. She has a follow up this Friday, 06/30/2020.  During the interim, she has been okay. She had both of her expanders taken out last month due to infections. She is disappointed because she has been having these problems for a long time. She is still having drainage from her right breast incision. The left side seems to be healing well.  She denies fever.  The patient states that she has lost weight. When she had the infections, she had no appetite. She is happy with her current weight and recently bought a treadmill.   Her right ear is still "plugged up." Her right arm lymphedema has been fine and she has not needed to wear her sleeve. Her neuropathy is stable and her entire hands and feet are numb. She takes Gabapentin 200 mg BID.  She will increase the dose to 300 mg BID.  She will be receiving Zometa in 08/2020. She denies any dental concerns.   Past Medical History:  Diagnosis Date  . Ankle fracture   . Breast cancer ([place])    bilateral  . Cancer ([place])    Basal Cell Carcinoma, about 2011 chest  . Cataract   . Hemorrhoids   . History of methicillin resistant  staphylococcus aureus (MRSA) 2015  . Migraines    MIGRAINES occ  . Personal history of chemotherapy   . Personal history of radiation therapy   . Wears glasses     Past Surgical History:  Procedure Laterality Date  . BREAST BIOPSY Bilateral 10/29/2017   L breast DCIS, R breast invasive mammary carcinoma of no special type, ER/PR+, Her2/neu 1+  . BREAST RECONSTRUCTION Right 02/07/2020   Procedure: BREAST RECONSTRUCTION;  Surgeon: Wallace Going, DO;  Location: [facility];  Service: Plastics;  Laterality: Right;  . BREAST RECONSTRUCTION WITH PLACEMENT OF TISSUE EXPANDER AND FLEX HD (ACELLULAR HYDRATED DERMIS) Bilateral 08/30/2019   Procedure: BREAST RECONSTRUCTION WITH PLACEMENT OF TISSUE EXPANDER AND FLEX HD (ACELLULAR HYDRATED DERMIS);  Surgeon: Wallace Going, DO;  Location: [facility];  Service: Plastics;  Laterality: Bilateral;  2.5 hours, please  . CESAREAN SECTION    . COLONOSCOPY  2012  . COLONOSCOPY WITH PROPOFOL N/A 12/01/2018   Procedure: COLONOSCOPY WITH PROPOFOL;  Surgeon: Lucilla Lame, MD;  Location: [facility];  Service: Endoscopy;  Laterality: N/A;  . FRACTURE SURGERY     ankle  . IRRIGATION AND DEBRIDEMENT ABSCESS Right 03/12/2018   Procedure: IRRIGATION AND DEBRIDEMENT RIGHT AXILLARY ABSCESS;  Surgeon: Robert Bellow, MD;  Location: [facility];  Service: General;  Laterality: Right;  . LATISSIMUS FLAP TO BREAST Right 02/07/2020   Procedure: LATISSIMUS MYOCUTANEOUS FLAP TO BREAST;  Surgeon: Wallace Going, DO;  Location: [facility];  Service: Plastics;  Laterality: Right;  3 hours  . MANDIBLE FRACTURE SURGERY    . MASTECTOMY    . MASTECTOMY MODIFIED RADICAL Right 07/10/2018   ypT2 ypN2a ER/PR positive, HER-2/neu negative  Surgeon: Robert Bellow, MD;  Location: [facility];  Service: General;  Laterality: Right;  . PORT-A-CATH REMOVAL Left 02/07/2020   Procedure: REMOVAL PORT-A-CATH;  Surgeon: Wallace Going, DO;  Location: [facility];  Service: Plastics;   Laterality: Left;  . PORTACATH PLACEMENT Left 11/28/2017   Procedure: INSERTION PORT-A-CATH;  Surgeon: Robert Bellow, MD;  Location: [facility];  Service: General;  Laterality: Left;  . REMOVAL OF TISSUE EXPANDER Right 06/05/2020   Procedure: TISSUE EXPANDER REMOVAL;  Surgeon: Wallace Going, DO;  Location: [facility];  Service: Plastics;  Laterality: Right;  . SIMPLE MASTECTOMY WITH AXILLARY SENTINEL NODE BIOPSY Left 07/10/2018   High-grade DCIS SIMPLE MASTECTOMY;  Surgeon: Robert Bellow, MD;  Location: [facility];  Service: General;  Laterality: Left;  . TISSUE EXPANDER PLACEMENT Left 06/05/2020   Procedure: TISSUE EXPANDER FLUID REMOVAL-100ML;  Surgeon: Wallace Going, DO;  Location: [facility];  Service: Plastics;  Laterality: Left;  . TISSUE EXPANDER PLACEMENT Left 06/14/2020   Procedure: TISSUE EXPANDER REMOVAL;  Surgeon: Wallace Going, DO;  Location: [facility];  Service: Plastics;  Laterality: Left;  45 min, please    Family History  Problem Relation Age of Onset  . Cancer Mother        Esophageal Cancer  . Early death Mother        Age 32  . Cancer Father        Lung Cancer  . Diabetes Father   . Diabetes Brother        Controlled with diet  . Heart attack Paternal Grandfather   . Colon cancer Neg Hx     Social History:  reports that she quit smoking about 43 years ago. She has a 2.50 pack-year smoking history. She has never used smokeless tobacco. She reports current alcohol use of about 1.0 standard drink of alcohol per week. She reports that she does not use drugs. She lives in [place]. The patient is accompanied by her daughter via iPad today.  Allergies:  Allergies  Allergen Reactions  . Peanut-Containing Drug Products Swelling and Other (See Comments)    Only when eating too many peanuts, swelling with lips and tingly face    Current Medications: Current Outpatient Medications  Medication Sig Dispense Refill  . acetaminophen (TYLENOL) 500 MG  tablet Take 1,000 mg by mouth every 6 (six) hours as needed for moderate pain or headache.     . b complex vitamins tablet Take 1 tablet by mouth daily.    . Calcium-Magnesium-Vitamin D (CALCIUM 1200+D3 PO) Take 1,200 mg by mouth daily.     Marland Kitchen gabapentin (NEURONTIN) 100 MG capsule Take 2 capsules (200 mg total) by mouth 2 (two) times daily. 120 capsule 1  . ibuprofen (ADVIL) 200 MG tablet Take 400 mg by mouth every 6 (six) hours as needed for mild pain or moderate pain.    . Investigational aspirin/placebo 300 MG tablet Alliance [id number] Take 1 tablet by mouth daily. Take with food or a full glass of water.  Do not crush enteric coated tablets. (Patient taking differently: Take 300 tablets by mouth daily. Take with food or a full glass of water.  Do not crush enteric coated tablets.) 180 tablet 0  . letrozole (FEMARA) 2.5 MG tablet  TAKE 1 TABLET BY MOUTH EVERY DAY (Patient taking differently: Take 2.5 mg by mouth daily. ) 90 tablet 0  . loratadine (CLARITIN) 10 MG tablet Take 10 mg by mouth every morning.     . Multiple Vitamin (MULTIVITAMIN WITH MINERALS) TABS tablet Take 1 tablet by mouth daily. Centrum Silver    . Naphazoline HCl (CLEAR EYES OP) Place 1 drop into both eyes daily.    . polyethylene glycol (MIRALAX / GLYCOLAX) packet Take 17 g by mouth daily as needed for mild constipation or moderate constipation (for constipation.).     Marland Kitchen Probiotic Product (PROBIOTIC PO) Take 1 capsule by mouth daily.    . Melatonin 5 MG CAPS Take 5 mg by mouth daily as needed (sleep).  (Patient not taking: Reported on 06/26/2020)    . triamcinolone ointment (KENALOG) 0.5 % Apply 1 application topically 2 (two) times daily. Apply to rash twice a day (Patient not taking: Reported on 06/26/2020) 30 g 0   No current facility-administered medications for this visit.   Facility-Administered Medications Ordered in Other Visits  Medication Dose Route Frequency Provider Last Rate Last Admin  . heparin lock flush 100 unit/mL   500 Units Intracatheter PRN Lequita Asal, MD        Review of Systems  Constitutional: Positive for weight loss (17 lbs). Negative for chills, diaphoresis, fever and malaise/fatigue.  HENT: Positive for hearing loss (right ear; using ear drops). Negative for congestion, ear pain, nosebleeds, sinus pain and sore throat.        "Swooshing" sound in right ear, feels "plugged up."  Eyes: Negative.  Negative for blurred vision and double vision.  Respiratory: Negative.  Negative for cough, hemoptysis, sputum production and shortness of breath.   Cardiovascular: Negative.  Negative for chest pain, palpitations, orthopnea and leg swelling.  Gastrointestinal: Negative.  Negative for abdominal pain, blood in stool, constipation, diarrhea, heartburn, melena, nausea and vomiting.  Genitourinary: Negative.  Negative for dysuria, frequency, hematuria and urgency.  Musculoskeletal: Negative for back pain, falls, joint pain, myalgias and neck pain.       Lymphedema much better.  Skin: Negative.  Negative for rash.       Right breast incision drainage.  Neurological: Positive for tingling (whole hands and feet). Negative for dizziness, tremors, speech change, focal weakness, weakness and headaches.  Endo/Heme/Allergies: Negative.  Does not bruise/bleed easily.  Psychiatric/Behavioral: Negative.  Negative for depression. The patient is not nervous/anxious.   All other systems reviewed and are negative.  Performance status (ECOG): 1  Vitals Blood pressure (!) 123/58, pulse 77, temperature (!) 97.4 F (36.3 C), temperature source Tympanic, resp. rate 16, weight 139 lb 15.9 oz (63.5 kg), SpO2 98 %.   Physical Exam Vitals and nursing note reviewed.  Constitutional:      General: She is not in acute distress.    Appearance: She is well-developed. She is not diaphoretic.  HENT:     Head: Normocephalic and atraumatic.     Comments: Gray-brown hair.    Mouth/Throat:     Mouth: Mucous membranes are  moist.     Pharynx: Oropharynx is clear. No oropharyngeal exudate.  Eyes:     General: No scleral icterus.    Extraocular Movements: Extraocular movements intact.     Conjunctiva/sclera: Conjunctivae normal.     Pupils: Pupils are equal, round, and reactive to light.  Neck:     Vascular: No JVD.  Cardiovascular:     Rate and Rhythm: Normal rate and regular  rhythm.     Heart sounds: Normal heart sounds. No murmur heard.   Pulmonary:     Effort: Pulmonary effort is normal. No respiratory distress.     Breath sounds: Normal breath sounds. No wheezing or rales.  Chest:     Chest wall: No tenderness.     Breasts:        Right: No inverted nipple, mass, nipple discharge, skin change or tenderness.        Left: No inverted nipple, mass, nipple discharge, skin change or tenderness.  Abdominal:     General: Bowel sounds are normal. There is no distension.     Palpations: Abdomen is soft. There is no hepatomegaly, splenomegaly or mass.     Tenderness: There is no abdominal tenderness. There is no guarding or rebound.  Musculoskeletal:        General: No swelling or tenderness. Normal range of motion.     Cervical back: Normal range of motion and neck supple.  Lymphadenopathy:     Head:     Right side of head: No preauricular, posterior auricular or occipital adenopathy.     Left side of head: No preauricular, posterior auricular or occipital adenopathy.     Cervical: No cervical adenopathy.     Upper Body:     Right upper body: No supraclavicular or axillary adenopathy.     Left upper body: No supraclavicular or axillary adenopathy.     Lower Body: No right inguinal adenopathy. No left inguinal adenopathy.  Skin:    General: Skin is warm and dry.     Comments: Dressing on right breast.  Neurological:     Mental Status: She is alert and oriented to person, place, and time.  Psychiatric:        Behavior: Behavior normal.        Thought Content: Thought content normal.         Judgment: Judgment normal.    Appointment on 06/26/2020  Component Date Value Ref Range Status  . Sodium 06/26/2020 137  135 - 145 mmol/L Final  . Potassium 06/26/2020 3.9  3.5 - 5.1 mmol/L Final  . Chloride 06/26/2020 103  98 - 111 mmol/L Final  . CO2 06/26/2020 28  22 - 32 mmol/L Final  . Glucose, Bld 06/26/2020 121* 70 - 99 mg/dL Final   Glucose reference range applies only to samples taken after fasting for at least 8 hours.  . BUN 06/26/2020 14  8 - 23 mg/dL Final  . Creatinine, Ser 06/26/2020 0.88  0.44 - 1.00 mg/dL Final  . Calcium 06/26/2020 9.3  8.9 - 10.3 mg/dL Final  . Total Protein 06/26/2020 7.0  6.5 - 8.1 g/dL Final  . Albumin 06/26/2020 3.7  3.5 - 5.0 g/dL Final  . AST 06/26/2020 20  15 - 41 U/L Final  . ALT 06/26/2020 15  0 - 44 U/L Final  . Alkaline Phosphatase 06/26/2020 60  38 - 126 U/L Final  . Total Bilirubin 06/26/2020 0.5  0.3 - 1.2 mg/dL Final  . GFR calc non Af Amer 06/26/2020 >60  >60 mL/min Final  . GFR calc Af Amer 06/26/2020 >60  >60 mL/min Final  . Anion gap 06/26/2020 6  5 - 15 Final   Performed at [facility], [address] [place], [place] [address]  . WBC 06/26/2020 4.7  4.0 - 10.5 K/uL Final  . RBC 06/26/2020 3.89  3.87 - 5.11 MIL/uL Final  . Hemoglobin 06/26/2020 10.6* 12.0 - 15.0  g/dL Final  . HCT 06/26/2020 33.5* 36 - 46 % Final  . MCV 06/26/2020 86.1  80.0 - 100.0 fL Final  . MCH 06/26/2020 27.2  26.0 - 34.0 pg Final  . MCHC 06/26/2020 31.6  30.0 - 36.0 g/dL Final  . RDW 06/26/2020 15.8* 11.5 - 15.5 % Final  . Platelets 06/26/2020 283  150 - 400 K/uL Final  . nRBC 06/26/2020 0.0  0.0 - 0.2 % Final  . Neutrophils Relative % 06/26/2020 60  % Final  . Neutro Abs 06/26/2020 2.9  1.7 - 7.7 K/uL Final  . Lymphocytes Relative 06/26/2020 25  % Final  . Lymphs Abs 06/26/2020 1.2  0.7 - 4.0 K/uL Final  . Monocytes Relative 06/26/2020 9  % Final  . Monocytes Absolute 06/26/2020 0.4  0 - 1 K/uL Final  . Eosinophils Relative 06/26/2020  4  % Final  . Eosinophils Absolute 06/26/2020 0.2  0 - 0 K/uL Final  . Basophils Relative 06/26/2020 1  % Final  . Basophils Absolute 06/26/2020 0.1  0 - 0 K/uL Final  . Immature Granulocytes 06/26/2020 1  % Final  . Abs Immature Granulocytes 06/26/2020 0.03  0.00 - 0.07 K/uL Final   Performed at [facility], [address] [place], [place] [address]    Assessment:  Megan Vega is a 64 y.o. female with clinical stage T2N3bM0 right breast cancerand DCIS in the left breasts/p neoadjuvant chemotherapyfollowed by left simple mastectomy and right modified radical mastectomyon 07/10/2018. Left breast pathologyrevealed 2.8 cm residual high grade DCIS, comedo type. There was atypical lobular hyperplasia. Four sentinel lymph nodes were negative. Right breast pathologyrevealed 3.6 cm residual grade II invasive mammary carcinoma and high grade DCIS. There was lymphovascular invasion.There was metastatic carcinoma in 2 sentinel lymph nodes. Right axillary dissection revealed metastatic carcinoma in 7 lymph nodes. There were a total of 9 lymph nodes with macrometastasis (largest 22 mm). Pathologic stagewas ypT2 ypN2a.  She underwent ultrasound guided biopsyon 10/29/2017. Pathology from the right breastat the 10 o'clock position 3 cm from the nipple revealed a 1.4 cm grade II invasive mammary carcinoma of no special type. Tumor was ER + (>90%), PR + (90%) and Her2/neu 1+. Right axillary node biopsyrevealed metastatic carcinoma. Left breast biopsyat the 2 o'clock position 3 cm from the nipple revealed high grade DCIS with comedonecrosis.   She received 4 cycles of neoadjuvantAC (12/05/2017 - 01/30/2018) with OnPro Neulasta support. She received 12 weeks ofneoadjuvant Taxol(02/13/2018 - 02/27/2018; 04/10/2018 - 06/05/2018). She received radiationfrom 09/23/2018 - 11/23/2018.She began letrozoleon02/19/2020.  Patient underwent bilateral breast reconstruction with  placement of Acellular Dermal Matrix and tissue expanders on 08/30/2019.  She underwent right breast reconstruction with latissimus myocutaneous flap and removal of her port-a-cath on 02/07/2020.   CA27.29has been followed: 23.5 on 11/14/2017,<3.5 on 08/27/2018, 7.4 on 01/06/2019, 9.2 on 05/24/2019, 11.9 on 08/23/2019, and 5.8 on 06/26/2020.  Bone densityon 12/16/2016 revealed osteopeniawith a T-score of -1.6 in the LEFTfemoral neck on 12/16/2017. Bone densityon 11/24/2018 revealed osteopenia with a T-score of -2.1 in the LEFTfemoral neck.  She receives Zometa every 6 months (08/23/2019 - 02/21/2020).  She developed a right axillary abscessand underwent incision and drainage on 03/12/2018. Culture grew staph aureus. She completed Bactrim on 03/26/2018.  She underwent right breast reconstruction with latissimus myocutaneous flap and removal of her port-a-cath on 02/07/2020.  Expanders were taken out in 05/2020 secondary to infection.  She received the COVID-19 vaccine in 04/2020 and 05/2020.  Symptomatically, she notes mild lymphedema.  Neuropathy is persistent.  Exam reveals no evidence of recurrent disease.  Plan: 1.   Labs today: CBC with diff, CMP, CA27.29. 2.Stage IIIBRIGHTbreast cancer Clinically,  she appears to be doing well.             She is s/pbilateral mastectomy.              She underwent right breast reconstruction with latissimus myocutaneous flap on 02/07/2020   Splinters were removed secondary to infection.  CA 27.29 is 5.8 today.    Continue Femara 3.Chemotherapy induced neuropathy Neuropathy in her hands and feet are unchanged. She is currently taking Neurontin 200 mg p.o. twice daily. Increase Neurontin to 200 mg p.o. twice daily B12 was normal on 05/24/2019. Check B12 annually. 4.Osteopenia Bone density on01/2020revealed a T-score of -2.1. Patient remains on calcium and vitamin D. Patient receives Zometa twice yearly  (last 02/21/2020). Next Zometa due in 08/2020. 5. Lymphedema Lymphedema is well controlled.  To new current management. 6.   RTC as scheduled for Zometa. 7.   RTC in 6 months for MD assessment and labs (CBC with diff, CMP, Mg, CA27.29).  I discussed the assessment and treatment plan with the patient.  The patient was provided an opportunity to ask questions and all were answered.  The patient agreed with the plan and demonstrated an understanding of the instructions.  The patient was advised to call back if the symptoms worsen or if the condition fails to improve as anticipated.   Lequita Asal, MD, PhD    06/26/2020, 2:42 PM  I, Mirian Mo Tufford, am acting as Education administrator for [employer]. Mike Gip, MD, PhD.  I, Tayon Parekh C. Mike Gip, MD, have reviewed the above documentation for accuracy and completeness, and I agree with the above.

## 2020-06-26 ENCOUNTER — Encounter: Payer: Self-pay | Admitting: *Deleted

## 2020-06-26 ENCOUNTER — Inpatient Hospital Stay: Payer: 59 | Attending: Hematology and Oncology | Admitting: Hematology and Oncology

## 2020-06-26 ENCOUNTER — Inpatient Hospital Stay: Payer: 59

## 2020-06-26 ENCOUNTER — Other Ambulatory Visit: Payer: Self-pay

## 2020-06-26 ENCOUNTER — Encounter: Payer: Self-pay | Admitting: Hematology and Oncology

## 2020-06-26 VITALS — BP 123/58 | HR 77 | Temp 97.4°F | Resp 16 | Wt 140.0 lb

## 2020-06-26 DIAGNOSIS — C50411 Malignant neoplasm of upper-outer quadrant of right female breast: Secondary | ICD-10-CM

## 2020-06-26 DIAGNOSIS — C773 Secondary and unspecified malignant neoplasm of axilla and upper limb lymph nodes: Secondary | ICD-10-CM | POA: Insufficient documentation

## 2020-06-26 DIAGNOSIS — Z9221 Personal history of antineoplastic chemotherapy: Secondary | ICD-10-CM | POA: Diagnosis not present

## 2020-06-26 DIAGNOSIS — Z9013 Acquired absence of bilateral breasts and nipples: Secondary | ICD-10-CM | POA: Insufficient documentation

## 2020-06-26 DIAGNOSIS — T451X5A Adverse effect of antineoplastic and immunosuppressive drugs, initial encounter: Secondary | ICD-10-CM

## 2020-06-26 DIAGNOSIS — M85852 Other specified disorders of bone density and structure, left thigh: Secondary | ICD-10-CM

## 2020-06-26 DIAGNOSIS — C50911 Malignant neoplasm of unspecified site of right female breast: Secondary | ICD-10-CM | POA: Diagnosis present

## 2020-06-26 DIAGNOSIS — Z79811 Long term (current) use of aromatase inhibitors: Secondary | ICD-10-CM | POA: Insufficient documentation

## 2020-06-26 DIAGNOSIS — Z79899 Other long term (current) drug therapy: Secondary | ICD-10-CM | POA: Insufficient documentation

## 2020-06-26 DIAGNOSIS — Z170 Estrogen receptor positive status [ER+]: Secondary | ICD-10-CM | POA: Insufficient documentation

## 2020-06-26 DIAGNOSIS — I890 Lymphedema, not elsewhere classified: Secondary | ICD-10-CM | POA: Diagnosis not present

## 2020-06-26 DIAGNOSIS — Z006 Encounter for examination for normal comparison and control in clinical research program: Secondary | ICD-10-CM | POA: Diagnosis present

## 2020-06-26 DIAGNOSIS — M8580 Other specified disorders of bone density and structure, unspecified site: Secondary | ICD-10-CM | POA: Insufficient documentation

## 2020-06-26 DIAGNOSIS — Z86000 Personal history of in-situ neoplasm of breast: Secondary | ICD-10-CM | POA: Diagnosis not present

## 2020-06-26 DIAGNOSIS — Z923 Personal history of irradiation: Secondary | ICD-10-CM | POA: Insufficient documentation

## 2020-06-26 DIAGNOSIS — G620 Drug-induced polyneuropathy: Secondary | ICD-10-CM | POA: Diagnosis not present

## 2020-06-26 LAB — COMPREHENSIVE METABOLIC PANEL
ALT: 15 U/L (ref 0–44)
AST: 20 U/L (ref 15–41)
Albumin: 3.7 g/dL (ref 3.5–5.0)
Alkaline Phosphatase: 60 U/L (ref 38–126)
Anion gap: 6 (ref 5–15)
BUN: 14 mg/dL (ref 8–23)
CO2: 28 mmol/L (ref 22–32)
Calcium: 9.3 mg/dL (ref 8.9–10.3)
Chloride: 103 mmol/L (ref 98–111)
Creatinine, Ser: 0.88 mg/dL (ref 0.44–1.00)
GFR calc Af Amer: >60 mL/min (ref >60)
GFR calc non Af Amer: >60 mL/min (ref >60)
Glucose, Bld: 121 mg/dL — ABNORMAL HIGH (ref 70–99)
Potassium: 3.9 mmol/L (ref 3.5–5.1)
Sodium: 137 mmol/L (ref 135–145)
Total Bilirubin: 0.5 mg/dL (ref 0.3–1.2)
Total Protein: 7 g/dL (ref 6.5–8.1)

## 2020-06-26 LAB — CBC WITH DIFFERENTIAL/PLATELET
Abs Immature Granulocytes: 0.03 10*3/uL (ref 0.00–0.07)
Basophils Absolute: 0.1 10*3/uL (ref 0.0–0.1)
Basophils Relative: 1 %
Eosinophils Absolute: 0.2 10*3/uL (ref 0.0–0.5)
Eosinophils Relative: 4 %
HCT: 33.5 % — ABNORMAL LOW (ref 36.0–46.0)
Hemoglobin: 10.6 g/dL — ABNORMAL LOW (ref 12.0–15.0)
Immature Granulocytes: 1 %
Lymphocytes Relative: 25 %
Lymphs Abs: 1.2 10*3/uL (ref 0.7–4.0)
MCH: 27.2 pg (ref 26.0–34.0)
MCHC: 31.6 g/dL (ref 30.0–36.0)
MCV: 86.1 fL (ref 80.0–100.0)
Monocytes Absolute: 0.4 10*3/uL (ref 0.1–1.0)
Monocytes Relative: 9 %
Neutro Abs: 2.9 10*3/uL (ref 1.7–7.7)
Neutrophils Relative %: 60 %
Platelets: 283 10*3/uL (ref 150–400)
RBC: 3.89 MIL/uL (ref 3.87–5.11)
RDW: 15.8 % — ABNORMAL HIGH (ref 11.5–15.5)
WBC: 4.7 10*3/uL (ref 4.0–10.5)
nRBC: 0 % (ref 0.0–0.2)

## 2020-06-26 NOTE — Progress Notes (Signed)
Patient here for oncology follow-up appointment, expresses no complaints or concerns at this time. Patient states she's been doing well since last plastic reconstruction surgery.

## 2020-06-26 NOTE — Research (Addendum)
Patient Megan Vega presented to the [facility] this afternoon for purpose of completing her 18 month assessment for the [id number] [name] study. Patient was seen by her Oncologist Dr. Mike Gip earlier this afternoon at the [facility] for H&P and breast assessment. She report having had some problems recently with her breast tissue expanders and states she had some infection and had to have them both removed. States she still has the drains in from this recent surgery, and due to all of the problems, she has lost a significant amount of weight recently citing no appetite and increased pain. Her weight was 139 lbs, 15.9 oz. Temp - 97.4, P-77, R-16, B/P-123/58 on left as measured at the [facility].  Ms. Hurd returned her medication diaries but did not bring her old bottle of aspirin/placebo. Instructed her to please count the remaining pills and call me to let me know how many there are left. Also instructed patient that the old bottle will expire in November, so she should finish taking those Aspirin before she starts the new bottle. Per her calendar, there were several missed doses that coincide with her surgeries totaling 9 missed doses. Ms. Oberman also admits that she has used OTC Ibuprofen for pain at times, but states it was less than once weekly as she mostly uses Tylenol or hydrocodone for her pain. Patient denies any further NSAID use. Solicited Adverse Events were assessed and patient denies experiencing any of the study solicited AEs. She was dispensed a new bottle of patient specific aspirin/placebo - 300mg  tablets, #200 with study ID [id number] on the label, which was verified by myself and Debbora Lacrosse, PharmD. She was also given 6 months of medication diaries to last until she returns to the clinic in March, 2022. Patient was reminded to take the study drug at the same time daily with food or a full glass of water. States she usually takes her study drug in the evening with supper so  that she does not have an empty stomach. Solicited AEs were collected prior to medication exchange.  Ms. Sculley is aware that she can call at any time if she has questions or develops new side effects. All AEs with grade and attribution are listed below:   Study: [id number] [name] Cycle: 18 month Event Grade Onset Date Resolved Date Drug Name Attribution Treatment Comments  GI Bleeding 0        Intracranial hemorrhage 0        Epistaxis 0        Hematuria 0        Dyspepsia 0      Only occasionally after eating spicy foods  Gastritis 0        Bruising 0        Chronic tremors 1 baseline   unrelated    Peripheral sensory neuropathy 1 baseline   unrelated B-complex vitamins Residual from chemotherapy  Occasional migraines 1 baseline   unrelated prn Excedrin migraine   Lymphedema in Rt upper arm 1 05/24/2019   mastectomy Physical therapy "mild" per MD  Weight loss  1 06/26/2020   unrelated  No appetite due to complications from tissue expanders  [name], BSN, MHA, OCN 06/26/2020 3:58 PM

## 2020-06-27 LAB — CANCER ANTIGEN 27.29: CA 27.29: 5.8 U/mL (ref 0.0–38.6)

## 2020-06-29 NOTE — Progress Notes (Signed)
Patient is a 64 year old female here for follow-up after undergoing removal of left breast tissue expander on 06/14/2020 with Dr. Samule Ohm was applied to left breast incision at time of surgery.At last visit on 06/20/2020 left breast incision was intact, clean and dry.  Drain was in place.  Right breast incision had opened on the inferior portion of the latissimus flap ~1 mm opening draining clear reddish-yellow fluid. Was on bactrim.  ~ 2 weeks PO Patient reports she is feeling well today.  Drain output has been less than 5 cc/day for the last several days.  Drain removed today.  Slight irritation redness around the drain site.  No signs of infection.  Left breast incision is closed except for approximately 1 mm opening towards the lateral side of the incision.  Some clear drainage present and scattered areas of redness.  No overt signs of infection.  Right breast incision is intact except for the small opening on the lower medial portion of the latissimus flap.  Opening has increased in size slightly since last visit, now approximately 2 mm.  Some clear drainage present and scattered areas of redness that appear mostly from the adhesive.  Lower portion of incision on her back has a fluid-filled pocket.  No fluid palpated on surrounding tissue.  No overt signs of infection.  Patient denies fevers, chills, nausea/vomiting.  Reports she finished the Bactrim earlier this week.  Apply Vaseline daily to openings of bilateral breast incisions and cover with bandage.  Apply Vaseline daily to drain site opening and cover with gauze or bandage.  Follow-up in 1 week.  Call office with any questions/concerns or if condition worsens.      Pictures were obtained of the patient and placed in the chart with the patient's or guardian's permission.  The [place] was signed into law in 2016 which includes the topic of electronic health records.  This provides immediate access to information in MyChart.   This includes consultation notes, operative notes, office notes, lab results and pathology reports.  If you have any questions about what you read please let us know at your next visit or call us at the office.  We are right here with you.

## 2020-06-30 ENCOUNTER — Encounter: Payer: Self-pay | Admitting: Plastic Surgery

## 2020-06-30 ENCOUNTER — Other Ambulatory Visit: Payer: Self-pay

## 2020-06-30 ENCOUNTER — Ambulatory Visit (INDEPENDENT_AMBULATORY_CARE_PROVIDER_SITE_OTHER): Payer: 59 | Admitting: Plastic Surgery

## 2020-06-30 VITALS — BP 114/72 | HR 75 | Temp 98.3°F

## 2020-06-30 DIAGNOSIS — Z9013 Acquired absence of bilateral breasts and nipples: Secondary | ICD-10-CM

## 2020-07-04 ENCOUNTER — Encounter: Payer: Self-pay | Admitting: Hematology and Oncology

## 2020-07-04 MED ORDER — INV-ASPIRIN/PLACEBO 300 MG TABS ALLIANCE A011502: 1.0000 | ORAL_TABLET | Freq: Every day | ORAL | 0 refills | Status: DC

## 2020-07-06 NOTE — Progress Notes (Signed)
Patient is a 64 year old female here for follow-up after undergoing removal of left breast tissue expander on 06/14/2020 with Dr. Samule Ohm was applied to the left breast incision at time of surgery.  At last visit on 8/13 left breast incision was closed except for small opening towards the lateral side of the incision.  Some clear drainage was present and some scattered areas of erythema.  Right breast incision was intact except for a small opening on the lower medial portion of the latissimus flap with clear drainage and scattered areas of erythema.  The lower portion of the incision on her back has a fluid-filled pocket with no fluid palpated on surrounding tissue.  No overt signs of infection and patient had just completed several weeks of treatment with Bactrim.  ~ 3 weeks PO Patient reports she is doing well and starting to get her energy back. Denies F/C, N/V. Small opening present on both the right and left incision. ~ 0.5 cm long.  Some serosanguinous drainage present from both openings. Some redness present from adhesive irritation. No signs of infection. On back latissimus flap incision, small fluid filled pocket at the inferior end of the incision. Small amount of clear serous fluid can be expressed from pocket. No signs of infection.   Place Cellerate on bilateral incision opening daily. Cover with mepilex boarder dressing. Apply vaseline and bandage over back fluid pocket daily. Follow up in 1 week. Call office with any questions/concerns.  The [place] was signed into law in 2016 which includes the topic of electronic health records.  This provides immediate access to information in MyChart.  This includes consultation notes, operative notes, office notes, lab results and pathology reports.  If you have any questions about what you read please let us know at your next visit or call us at the office.  We are right here with you.

## 2020-07-07 ENCOUNTER — Telehealth: Payer: Self-pay

## 2020-07-07 ENCOUNTER — Encounter: Payer: Self-pay | Admitting: Plastic Surgery

## 2020-07-07 ENCOUNTER — Ambulatory Visit (INDEPENDENT_AMBULATORY_CARE_PROVIDER_SITE_OTHER): Payer: 59 | Admitting: Plastic Surgery

## 2020-07-07 ENCOUNTER — Other Ambulatory Visit: Payer: Self-pay

## 2020-07-07 VITALS — BP 118/75 | HR 77 | Temp 97.3°F

## 2020-07-07 DIAGNOSIS — Z9013 Acquired absence of bilateral breasts and nipples: Secondary | ICD-10-CM

## 2020-07-07 NOTE — Telephone Encounter (Signed)
Faxed [facility] : mepilex, cellerate gel, cellerate powder apply daily

## 2020-07-16 NOTE — Progress Notes (Signed)
Patient is a 64 year old female here for follow-up after undergoing removal of left breast tissue expander on 06/14/2020 with Dr. Samule Ohm was applied to the left breast incision at time of surgery.  For the last few visits she has had a small opening on both the right and left incision ~ 0.5 cm long with serosanguineous drainage.  She is also had a small fluid-filled pocket on the inferior end of the back latissimus flap incision.  At last visit a small amount of clear serous fluid could be expressed from the pocket.  She began placing cellerate on bilateral incisions and covering with Mepilex border.  Applying Vaseline and bandage over the back incision fluid pocket  ~ 5 weeks PO Today Ms. Croak is feeling well. Openings on bilateral incisions remain unchanged; ~0.5 cm long with serosanguinous drainage. Blister like fluid pocket at inferior end of back latissimus flap incision also remains unchanged from previous visit. Patient reports it will occasionally spontaneously drain.  Patient has been using Cellerate (collagen) gel on bilateral breast incision openings since approximately Wednesday. All other portions of the incisions are healing well. No signs of infection, redness, seroma/hematoma. Patient denies F/C, N/V, CP, SOB.   After showering place Cellerate powder and gel on bilateral breast incision openings and cover with mepilex boarder dressing. Cover Blister like pocket on back incision with vaseline and gauze. Follow up in 1-2 weeks. Call office with any questions/concerns.

## 2020-07-17 ENCOUNTER — Encounter: Payer: Self-pay | Admitting: Plastic Surgery

## 2020-07-17 ENCOUNTER — Ambulatory Visit (INDEPENDENT_AMBULATORY_CARE_PROVIDER_SITE_OTHER): Payer: 59 | Admitting: Plastic Surgery

## 2020-07-17 ENCOUNTER — Other Ambulatory Visit: Payer: Self-pay

## 2020-07-17 VITALS — BP 108/74 | HR 95 | Temp 98.3°F

## 2020-07-17 DIAGNOSIS — Z9013 Acquired absence of bilateral breasts and nipples: Secondary | ICD-10-CM

## 2020-07-22 ENCOUNTER — Encounter: Payer: Self-pay | Admitting: Hematology and Oncology

## 2020-07-25 ENCOUNTER — Other Ambulatory Visit: Payer: Self-pay

## 2020-07-25 DIAGNOSIS — C50411 Malignant neoplasm of upper-outer quadrant of right female breast: Secondary | ICD-10-CM

## 2020-07-25 MED ORDER — LETROZOLE 2.5 MG PO TABS: 2.5000 mg | ORAL_TABLET | Freq: Every day | ORAL | 3 refills | Status: DC

## 2020-07-26 ENCOUNTER — Encounter: Payer: Self-pay | Admitting: Surgery

## 2020-07-26 ENCOUNTER — Other Ambulatory Visit: Payer: Self-pay

## 2020-07-26 ENCOUNTER — Ambulatory Visit (INDEPENDENT_AMBULATORY_CARE_PROVIDER_SITE_OTHER): Payer: 59 | Admitting: Surgery

## 2020-07-26 VITALS — BP 113/78 | HR 82 | Temp 98.7°F | Ht 64.0 in | Wt 143.8 lb

## 2020-07-26 DIAGNOSIS — C50411 Malignant neoplasm of upper-outer quadrant of right female breast: Secondary | ICD-10-CM

## 2020-07-26 DIAGNOSIS — D0512 Intraductal carcinoma in situ of left breast: Secondary | ICD-10-CM | POA: Diagnosis not present

## 2020-07-26 NOTE — Patient Instructions (Addendum)
Dr.Piscoya recommends patient to follow up with our office in one year. No mammogram required.   Patient advised that if she notices any concerns, to give our office a call.

## 2020-07-26 NOTE — Progress Notes (Signed)
07/26/2020  History of Present Illness: Megan Vega is a 64 y.o. female with history of right breast cancer s/p right MRM as well as left breast DCIS s/p left mastectomy and SLNBx, both done on 07/10/18.  She had neoadjuvant chemotherapy, adjuvant radiation, and is currently on Letrozole.  She had a right breast latissimus flap reconstruction and bilateral tissue expander with Dr. Marla Roe in 08/2019, but unfortunately both expanders were eventually removed in 05/2020 due to ongoing infection issues.  She still has two small wounds that are draining and slowly healing.    Patient denies any palpable masses, skin changes other than redness from adhesive from her bandages, or bony pain.    Past Medical History: Past Medical History:  Diagnosis Date  . Ankle fracture   . Breast cancer ([place])    bilateral  . Cancer ([place])    Basal Cell Carcinoma, about 2011 chest  . Cataract   . Hemorrhoids   . History of methicillin resistant staphylococcus aureus (MRSA) 2015  . Migraines    MIGRAINES occ  . Personal history of chemotherapy   . Personal history of radiation therapy   . Wears glasses      Past Surgical History: Past Surgical History:  Procedure Laterality Date  . BREAST BIOPSY Bilateral 10/29/2017   L breast DCIS, R breast invasive mammary carcinoma of no special type, ER/PR+, Her2/neu 1+  . BREAST RECONSTRUCTION Right 02/07/2020   Procedure: BREAST RECONSTRUCTION;  Surgeon: Wallace Going, DO;  Location: [place];  Service: Plastics;  Laterality: Right;  . BREAST RECONSTRUCTION WITH PLACEMENT OF TISSUE EXPANDER AND FLEX HD (ACELLULAR HYDRATED DERMIS) Bilateral 08/30/2019   Procedure: BREAST RECONSTRUCTION WITH PLACEMENT OF TISSUE EXPANDER AND FLEX HD (ACELLULAR HYDRATED DERMIS);  Surgeon: Wallace Going, DO;  Location: [facility];  Service: Plastics;  Laterality: Bilateral;  2.5 hours, please  . CESAREAN SECTION    . COLONOSCOPY  2012  . COLONOSCOPY WITH PROPOFOL N/A  12/01/2018   Procedure: COLONOSCOPY WITH PROPOFOL;  Surgeon: Lucilla Lame, MD;  Location[facility];  Service: Endoscopy;  Laterality: N/A;  . FRACTURE SURGERY     ankle  . IRRIGATION AND DEBRIDEMENT ABSCESS Right 03/12/2018   Procedure: IRRIGATION AND DEBRIDEMENT RIGHT AXILLARY ABSCESS;  Surgeon: Robert Bellow, MD;  Location: [facility];  Service: General;  Laterality: Right;  . LATISSIMUS FLAP TO BREAST Right 02/07/2020   Procedure: LATISSIMUS MYOCUTANEOUS FLAP TO BREAST;  Surgeon: Wallace Going, DO;  Location: [place];  Service: Plastics;  Laterality: Right;  3 hours  . MANDIBLE FRACTURE SURGERY    . MASTECTOMY    . MASTECTOMY MODIFIED RADICAL Right 07/10/2018   ypT2 ypN2a ER/PR positive, HER-2/neu negative  Surgeon: Robert Bellow, MD;  Location: [facility];  Service: General;  Laterality: Right;  . PORT-A-CATH REMOVAL Left 02/07/2020   Procedure: REMOVAL PORT-A-CATH;  Surgeon: Wallace Going, DO;  Location: [place];  Service: Plastics;  Laterality: Left;  . PORTACATH PLACEMENT Left 11/28/2017   Procedure: INSERTION PORT-A-CATH;  Surgeon: Robert Bellow, MD;  Location: [facility];  Service: General;  Laterality: Left;  . REMOVAL OF TISSUE EXPANDER Right 06/05/2020   Procedure: TISSUE EXPANDER REMOVAL;  Surgeon: Wallace Going, DO;  Location: [facility];  Service: Plastics;  Laterality: Right;  . SIMPLE MASTECTOMY WITH AXILLARY SENTINEL NODE BIOPSY Left 07/10/2018   High-grade DCIS SIMPLE MASTECTOMY;  Surgeon: Robert Bellow, MD;  Location: [facility];  Service: General;  Laterality: Left;  . TISSUE EXPANDER PLACEMENT Left  06/05/2020   Procedure: TISSUE EXPANDER FLUID REMOVAL-100ML;  Surgeon: Wallace Going, DO;  Location: [facility];  Service: Plastics;  Laterality: Left;  . TISSUE EXPANDER PLACEMENT Left 06/14/2020   Procedure: TISSUE EXPANDER REMOVAL;  Surgeon: Wallace Going, DO;  Location: [place];  Service: Plastics;  Laterality: Left;  45 min, please     Home Medications: Prior to Admission medications   Medication Sig Start Date End Date Taking? Authorizing Provider  acetaminophen (TYLENOL) 500 MG tablet Take 1,000 mg by mouth every 6 (six) hours as needed for moderate pain or headache.    Yes [provider]  b complex vitamins tablet Take 1 tablet by mouth daily.   Yes [provider]  Calcium-Magnesium-Vitamin D (CALCIUM 1200+D3 PO) Take 1,200 mg by mouth daily.    Yes [provider]  gabapentin (NEURONTIN) 100 MG capsule Take 2 capsules (200 mg total) by mouth 2 (two) times daily. 05/08/20  Yes Corcoran, Drue Second, MD  ibuprofen (ADVIL) 200 MG tablet Take 400 mg by mouth every 6 (six) hours as needed for mild pain or moderate pain.   Yes [provider]  Investigational aspirin/placebo 300 MG tablet Alliance [phone] Take 1 tablet by mouth daily. Take with food or a full glass of water.  Do not crush enteric coated tablets. Patient taking differently: Take 300 tablets by mouth daily. Take with food or a full glass of water.  Do not crush enteric coated tablets. 03/10/20  Yes Lequita Asal, MD  letrozole (FEMARA) 2.5 MG tablet Take 1 tablet (2.5 mg total) by mouth daily. 07/25/20  Yes Corcoran, Drue Second, MD  loratadine (CLARITIN) 10 MG tablet Take 10 mg by mouth every morning.    Yes [provider]  Melatonin 5 MG CAPS Take 5 mg by mouth daily as needed (sleep).    Yes [provider]  Multiple Vitamin (MULTIVITAMIN WITH MINERALS) TABS tablet Take 1 tablet by mouth daily. Centrum Silver   Yes [provider]  Naphazoline HCl (CLEAR EYES OP) Place 1 drop into both eyes daily.   Yes [provider]  polyethylene glycol (MIRALAX / GLYCOLAX) packet Take 17 g by mouth daily as needed for mild constipation or moderate constipation (for constipation.).    Yes [provider]  Probiotic Product (PROBIOTIC PO) Take 1 capsule by mouth daily.   Yes [provider]  triamcinolone ointment (KENALOG) 0.5 % Apply 1 application topically 2 (two) times daily. Apply to rash twice a day 04/11/20  Yes Dillingham, Loel Lofty, DO  Investigational aspirin/placebo 300 MG tablet Alliance [phone] Take 1 tablet by mouth daily. Take with food or a full glass of water.  Do not crush enteric coated tablets. 07/04/20   Lequita Asal, MD    Allergies: Allergies  Allergen Reactions  . Peanut-Containing Drug Products Swelling and Other (See Comments)    Only when eating too many peanuts, swelling with lips and tingly face    Review of Systems: Review of Systems  Constitutional: Negative for chills and fever.  Respiratory: Negative for shortness of breath.   Cardiovascular: Negative for chest pain.  Gastrointestinal: Negative for nausea and vomiting.  Skin:       One wound on each breast that is draining and slowly healing.    Physical Exam BP 113/78   Pulse 82   Temp 98.7 F (37.1 C) (Oral)   Ht 5' 4"  (1.626 m)   Wt 143 lb 12.8 oz (65.2 kg)  LMP  (LMP Unknown)   BMI 24.68 kg/m  CONSTITUTIONAL: No acute distress HEENT:  Normocephalic, atraumatic, extraocular motion intact. RESPIRATORY:  Lungs are clear, and breath sounds are equal bilaterally. Normal respiratory effort without pathologic use of accessory muscles. CARDIOVASCULAR: Heart is regular without murmurs, gallops, or rubs. BREAST:  Right breast s/p latissimus flap reconstruction.  She has a wound in the medial edge which has a special dressing on it which I decided to leave in place as we do not have the same dressing to reapply here.  What I can evaluate and palpate, there are no palpable masses, seromas, skin changes, or other concerns.  No right axillary or supraclavicular lymphadenopathy.  Left breast s/p mastectomy.  Also has a small dressing on the lateral aspect, but her exam also does not reveal any palpable masses, skin changes, or fluctuance.  No left axillary or supraclavicular  lymphadenopathy. NEUROLOGIC:  Motor and sensation is grossly normal.  Cranial nerves are grossly intact. PSYCH:  Alert and oriented to person, place and time. Affect is normal.  Labs/Imaging: No images.  Assessment and Plan: This is a 64 y.o. female s/p right MRM and left mastectomy with SLNBx.  --Exam today is reassuring.  Unfortunately the reconstruction with tissue expanders did not work and both were removed due to infection.  The wounds are slowly healing but at least reassured her that her exam is benign and there are no suspicious findings.  She continues to follow up with Ms. Young at Dr. Eusebio Friendly office. --Follow up in one year for repeat exam.  No imaging needed.  Face-to-face time spent with the patient and care providers was 15 minutes, with more than 50% of the time spent counseling, educating, and coordinating care of the patient.     Melvyn Neth, [facility]

## 2020-07-31 NOTE — Progress Notes (Signed)
Patient is a 64 yr-old female here for follow-up after undergoing removal of left breast tissue expander on 06/14/20 with Dr. Samule Ohm was applied to the left breast incsion at time of surgery. For the last several visits she's had small opening on both the right and left incisions ~ 0.5 cm long with serosanguineous drainage, She's also had a small fluid filled pocket on the inferior end of the back latissimus flap incision. Patient has been using cellerate on the breast wounds since ~ Aug 25.  ~ 7 weeks PO Patient reports she is feeling well. Left breast wound has increased in size (3 x 1.5 x 1 cm). Right breast wound has remained the same (~ 0.5 x 0.5 x 0.5 cm). Both have moderate drainage present. No signs of infection.  Skin is red from adhesive irritation. Patient denies pain or tenderness of the breast incisions. Small fluid filled pocket on inferior portion of back incision remains present, slightly smaller in size. No signs of infection.  Patient denies F/C, N/V.   Endoform placed over bilateral breast wounds today.  Every other day: place additional [place] Jelly on Adaptic (clear mesh) and cover with bandage.  Every 3-4 days: Remove entire dressing and shower. Place a fresh piece of endoform over each wound, lightly moisten with saline and cover with Adaptic (clear mesh). Then apply KY jelly and cover with bandage.   For back incision fluid pocket. Continue to apply vaseline daily and cover with bandage.  Follow up in 3 weeks.  Call office with any questions/concerns.      Pictures were obtained of the patient and placed in the chart with the patient's or guardian's permission.  The [place] was signed into law in 2016 which includes the topic of electronic health records.  This provides immediate access to information in MyChart.  This includes consultation notes, operative notes, office notes, lab results and pathology reports.  If you have any questions about what you  read please let us know at your next visit or call us at the office.  We are right here with you.

## 2020-08-01 ENCOUNTER — Ambulatory Visit (INDEPENDENT_AMBULATORY_CARE_PROVIDER_SITE_OTHER): Payer: 59 | Admitting: Plastic Surgery

## 2020-08-01 ENCOUNTER — Encounter: Payer: Self-pay | Admitting: Plastic Surgery

## 2020-08-01 ENCOUNTER — Telehealth: Payer: Self-pay

## 2020-08-01 ENCOUNTER — Other Ambulatory Visit: Payer: Self-pay

## 2020-08-01 VITALS — BP 117/71 | HR 83 | Temp 98.4°F

## 2020-08-01 DIAGNOSIS — S21001D Unspecified open wound of right breast, subsequent encounter: Secondary | ICD-10-CM

## 2020-08-01 DIAGNOSIS — Z9013 Acquired absence of bilateral breasts and nipples: Secondary | ICD-10-CM

## 2020-08-01 DIAGNOSIS — S21002D Unspecified open wound of left breast, subsequent encounter: Secondary | ICD-10-CM

## 2020-08-01 NOTE — Telephone Encounter (Signed)
Faxed Prism order with: mepilex every 2 days, adaptic-every 3 days, surgilube-every 2 days, endoform-every 3 days

## 2020-08-01 NOTE — Telephone Encounter (Signed)
error 

## 2020-08-02 ENCOUNTER — Other Ambulatory Visit: Payer: Self-pay | Admitting: Hematology and Oncology

## 2020-08-02 DIAGNOSIS — G620 Drug-induced polyneuropathy: Secondary | ICD-10-CM

## 2020-08-02 DIAGNOSIS — T451X5A Adverse effect of antineoplastic and immunosuppressive drugs, initial encounter: Secondary | ICD-10-CM

## 2020-08-20 NOTE — Progress Notes (Signed)
Patient is a 64 year old female here for follow-up after undergoing removal of left breast tissue expander on 06/14/2020 with Dr. Samule Ohm was applied to the left breast incision at time of surgery.  For the last several visit she has had small opening on both the right and left incisions approximately 0.5 cm long with serosanguineous drainage.  She is also had a small fluid pocket on the inferior end of the back latissimus flap incision.  Patient has been using cellerate on the breast wounds since approximately August 25.  At last visit on 9/14 the left breast wound had increased in size (3 x 1.5 x 1 cm).  Patient started on endoform covered with Adaptic over bilateral breast wounds to be changed every 3 to 4 days.  Every other day application of K-Y jelly to the Adaptic and cover with Mepilex border dressing.  ~ 10 weeks PO Patient reports she is feeling well.  Denies fever/chills, nausea/vomiting.  The small fluid pocket on the inferior end of the back latissimus flap incision has improved and is flat today.  The small opening on the right incision has not changed much in size superficially but appears to have tissue filling in and closing the opening. No signs of infection. Serous drainage present. The left breast incision wound has increased slightly in size (3.5 x 2 x 0.5 cm) but has good healthy tissue filling in. Serous drainage present. No signs of infection.  Continue with Endoform dressing changes every 3-4 days applying KY jelly every other day. Follow up in 2-3 weeks. Call office with any questions/concerns.  The [place] was signed into law in 2016 which includes the topic of electronic health records.  This provides immediate access to information in MyChart.  This includes consultation notes, operative notes, office notes, lab results and pathology reports.  If you have any questions about what you read please let us know at your next visit or call us at the office.  We  are right here with you.

## 2020-08-21 ENCOUNTER — Inpatient Hospital Stay: Payer: 59 | Attending: Hematology and Oncology

## 2020-08-21 ENCOUNTER — Other Ambulatory Visit: Payer: Self-pay

## 2020-08-21 ENCOUNTER — Ambulatory Visit: Payer: 59

## 2020-08-21 VITALS — BP 121/77 | HR 78 | Temp 97.8°F

## 2020-08-21 DIAGNOSIS — M8580 Other specified disorders of bone density and structure, unspecified site: Secondary | ICD-10-CM | POA: Diagnosis present

## 2020-08-21 DIAGNOSIS — T451X5A Adverse effect of antineoplastic and immunosuppressive drugs, initial encounter: Secondary | ICD-10-CM

## 2020-08-21 DIAGNOSIS — D701 Agranulocytosis secondary to cancer chemotherapy: Secondary | ICD-10-CM

## 2020-08-21 DIAGNOSIS — C50411 Malignant neoplasm of upper-outer quadrant of right female breast: Secondary | ICD-10-CM

## 2020-08-21 LAB — BASIC METABOLIC PANEL
Anion gap: 7 (ref 5–15)
BUN: 11 mg/dL (ref 8–23)
CO2: 27 mmol/L (ref 22–32)
Calcium: 9 mg/dL (ref 8.9–10.3)
Chloride: 104 mmol/L (ref 98–111)
Creatinine, Ser: 0.92 mg/dL (ref 0.44–1.00)
GFR calc Af Amer: >60 mL/min (ref >60)
GFR calc non Af Amer: >60 mL/min (ref >60)
Glucose, Bld: 91 mg/dL (ref 70–99)
Potassium: 3.7 mmol/L (ref 3.5–5.1)
Sodium: 138 mmol/L (ref 135–145)

## 2020-08-21 MED ORDER — SODIUM CHLORIDE 0.9 % IV SOLN
Freq: Once | INTRAVENOUS | Status: AC
  Filled 2020-08-21: qty 250

## 2020-08-21 MED ORDER — ZOLEDRONIC ACID 4 MG/100ML IV SOLN
4.0000 mg | Freq: Once | INTRAVENOUS | Status: AC
  Administered 2020-08-21: 4 mg via INTRAVENOUS
  Filled 2020-08-21: qty 100

## 2020-08-23 ENCOUNTER — Other Ambulatory Visit: Payer: Self-pay

## 2020-08-23 ENCOUNTER — Encounter: Payer: Self-pay | Admitting: Plastic Surgery

## 2020-08-23 ENCOUNTER — Ambulatory Visit (INDEPENDENT_AMBULATORY_CARE_PROVIDER_SITE_OTHER): Payer: 59 | Admitting: Plastic Surgery

## 2020-08-23 VITALS — BP 116/80 | HR 67 | Temp 98.1°F

## 2020-08-23 DIAGNOSIS — Z9013 Acquired absence of bilateral breasts and nipples: Secondary | ICD-10-CM

## 2020-08-23 DIAGNOSIS — S21001D Unspecified open wound of right breast, subsequent encounter: Secondary | ICD-10-CM

## 2020-08-23 DIAGNOSIS — S21002D Unspecified open wound of left breast, subsequent encounter: Secondary | ICD-10-CM

## 2020-08-24 ENCOUNTER — Other Ambulatory Visit: Payer: Self-pay

## 2020-08-24 ENCOUNTER — Encounter: Payer: Self-pay | Admitting: Hematology and Oncology

## 2020-08-24 DIAGNOSIS — G620 Drug-induced polyneuropathy: Secondary | ICD-10-CM

## 2020-08-24 DIAGNOSIS — T451X5A Adverse effect of antineoplastic and immunosuppressive drugs, initial encounter: Secondary | ICD-10-CM

## 2020-08-24 MED ORDER — GABAPENTIN 300 MG PO CAPS: 300.0000 mg | ORAL_CAPSULE | Freq: Two times a day (BID) | ORAL | 1 refills | Status: DC

## 2020-09-01 ENCOUNTER — Encounter: Payer: 59 | Admitting: Family

## 2020-09-01 ENCOUNTER — Telehealth: Payer: Self-pay

## 2020-09-01 NOTE — Telephone Encounter (Signed)
Faxed signed prescription to [facility] by Dr. Marla Roe for mastectomy supplies.

## 2020-09-08 ENCOUNTER — Ambulatory Visit (INDEPENDENT_AMBULATORY_CARE_PROVIDER_SITE_OTHER): Payer: 59 | Admitting: Family

## 2020-09-08 ENCOUNTER — Other Ambulatory Visit: Payer: Self-pay

## 2020-09-08 ENCOUNTER — Other Ambulatory Visit (HOSPITAL_COMMUNITY)
Admission: RE | Admit: 2020-09-08 | Discharge: 2020-09-08 | Disposition: A | Discharge status: Home / Self Care | Payer: 59 | Source: Ambulatory Visit | Attending: Family | Admitting: Family

## 2020-09-08 ENCOUNTER — Encounter: Payer: Self-pay | Admitting: Family

## 2020-09-08 VITALS — BP 118/76 | HR 73 | Temp 98.7°F | Ht 65.0 in | Wt 147.8 lb

## 2020-09-08 DIAGNOSIS — Z23 Encounter for immunization: Secondary | ICD-10-CM

## 2020-09-08 DIAGNOSIS — Z0000 Encounter for general adult medical examination without abnormal findings: Secondary | ICD-10-CM

## 2020-09-08 DIAGNOSIS — T451X5A Adverse effect of antineoplastic and immunosuppressive drugs, initial encounter: Secondary | ICD-10-CM

## 2020-09-08 DIAGNOSIS — G620 Drug-induced polyneuropathy: Secondary | ICD-10-CM

## 2020-09-08 NOTE — Progress Notes (Signed)
Subjective:    Patient ID: Megan Vega, female    DOB: Jan 17, 1956, 64 y.o.   MRN: [id number]  CC: Megan Vega is a 64 y.o. female who presents today for physical exam.    HPI: Feels well today No complaints.   No depression.   Right and left breast cancer  Double mastectomy, s/p chemo ( last 2019)and radiation ( last 2020) . Follows with Plastics ( Dr Baltazar Apo),  Oncology ( Dr Mike Gip)   Right arm lymphedema - chronic  Peripheral neuropathy- relieved with gabapentin 326m BID  prescribed by Dr CMike Gip Colorectal Cancer Screening: UTD in 2020 with Dr WAllen Norris repeat in 5 years mother had esophageal cancer.  Breast Cancer Screening: no longer due to mastectomy Cervical Cancer Screening: due Bone Health screening/DEXA for 65+: Done 2020, showed osteopenia; on zoledronic acid q6 months  Lung Cancer Screening: Doesn't have 30 year pack year history and age > 558years yo 89years  No have family history of AAA.        Tetanus - due        Labs: Screening labs today. Exercise: Gets regular exercise, treadmill everyone.   Alcohol use:  occassional Smoking/tobacco use: former smoker, quit 1978   H/o BCC. No longer following with dermatology.     HISTORY:  Past Medical History:  Diagnosis Date   Ankle fracture    [place]   [place]    Basal Cell Carcinoma, about 2011 chest   Cataract    Hemorrhoids    History of methicillin resistant staphylococcus aureus (MRSA) 2015   Migraines    MIGRAINES occ   Personal history of chemotherapy    Personal history of radiation therapy    Wears glasses     Past Surgical History:  Procedure Laterality Date   BREAST BIOPSY Bilateral 10/29/2017   L breast DCIS, R breast invasive mammary carcinoma of no special type, ER/PR+, Her2/neu 1+   BREAST RECONSTRUCTION Right 02/07/2020   Procedure: BREAST RECONSTRUCTION;  Surgeon: DWallace Going DO;  [facility] [facility]  Service: Plastics;   Laterality: Right;   BREAST RECONSTRUCTION WITH PLACEMENT OF TISSUE EXPANDER AND FLEX HD (ACELLULAR HYDRATED DERMIS) Bilateral 08/30/2019   Procedure: BREAST RECONSTRUCTION WITH PLACEMENT OF TISSUE EXPANDER AND FLEX HD (ACELLULAR HYDRATED DERMIS);  Surgeon: DWallace Going DO;  Location: [facility];  Service: Plastics;  Laterality: Bilateral;  2.5 hours, please   CESAREAN SECTION     COLONOSCOPY  2012   COLONOSCOPY WITH PROPOFOL N/A 12/01/2018   Procedure: COLONOSCOPY WITH PROPOFOL;  Surgeon: WLucilla Lame MD;  [facility]  Service: Endoscopy;  Laterality: N/A;   FRACTURE SURGERY     ankle   IRRIGATION AND DEBRIDEMENT ABSCESS Right 03/12/2018   Procedure: IRRIGATION AND DEBRIDEMENT RIGHT AXILLARY ABSCESS;  Surgeon: BRobert Bellow MD;  Location: [facility];  Service: General;  Laterality: Right;   LATISSIMUS FLAP TO BREAST Right 02/07/2020   Procedure: LATISSIMUS MYOCUTANEOUS FLAP TO BREAST;  Surgeon: DWallace Going DO;  [place] [facility]  Service: Plastics;  Laterality: Right;  3 hours   MANDIBLE FRACTURE SURGERY     MASTECTOMY     MASTECTOMY MODIFIED RADICAL Right 07/10/2018   ypT2 ypN2a ER/PR positive, HER-2/neu negative  Surgeon: BRobert Bellow MD;  Location: [facility];  Service: General;  Laterality: Right;   PORT-A-CATH REMOVAL Left 02/07/2020   Procedure: REMOVAL PORT-A-CATH;  Surgeon: DWallace Going DO;  [place] [facility]  Service:  Plastics;  Laterality: Left;   PORTACATH PLACEMENT Left 11/28/2017   Procedure: INSERTION PORT-A-CATH;  Surgeon: Robert Bellow, MD;  Location: [facility];  Service: General;  Laterality: Left;   REMOVAL OF TISSUE EXPANDER Right 06/05/2020   Procedure: TISSUE EXPANDER REMOVAL;  Surgeon: Wallace Going, DO;  Location: [facility];  Service: Plastics;  Laterality: Right;   SIMPLE MASTECTOMY WITH AXILLARY SENTINEL NODE BIOPSY Left 07/10/2018   High-grade DCIS SIMPLE MASTECTOMY;  Surgeon: Robert Bellow, MD;   Location: [facility];  Service: General;  Laterality: Left;   TISSUE EXPANDER PLACEMENT Left 06/05/2020   Procedure: TISSUE EXPANDER FLUID REMOVAL-100ML;  Surgeon: Wallace Going, DO;  Location: [facility];  Service: Plastics;  Laterality: Left;   TISSUE EXPANDER PLACEMENT Left 06/14/2020   Procedure: TISSUE EXPANDER REMOVAL;  Surgeon: Wallace Going, DO;  Location: [place];  Service: Plastics;  Laterality: Left;  45 min, please   Family History  Problem Relation Age of Onset   Cancer Mother        Esophageal Cancer   Early death Mother        Age 64   Cancer Father        Lung Cancer   Diabetes Father    Diabetes Brother        Controlled with diet   Heart attack Paternal Grandfather    Colon cancer Neg Hx       ALLERGIES: Peanut-containing drug products  Current Outpatient Medications on File Prior to Visit  Medication Sig Dispense Refill   acetaminophen (TYLENOL) 500 MG tablet Take 1,000 mg by mouth every 6 (six) hours as needed for moderate pain or headache.      b complex vitamins tablet Take 1 tablet by mouth daily.     Calcium-Magnesium-Vitamin D (CALCIUM 1200+D3 PO) Take 1,200 mg by mouth daily.      gabapentin (NEURONTIN) 300 MG capsule Take 1 capsule (300 mg total) by mouth 2 (two) times daily. 60 capsule 1   ibuprofen (ADVIL) 200 MG tablet Take 400 mg by mouth every 6 (six) hours as needed for mild pain or moderate pain.     Investigational aspirin/placebo 300 MG tablet Alliance [id number] Take 1 tablet by mouth daily. Take with food or a full glass of water.  Do not crush enteric coated tablets. (Patient taking differently: Take 300 tablets by mouth daily. Take with food or a full glass of water.  Do not crush enteric coated tablets.) 180 tablet 0   letrozole (FEMARA) 2.5 MG tablet Take 1 tablet (2.5 mg total) by mouth daily. 30 tablet 3   loratadine (CLARITIN) 10 MG tablet Take 10 mg by mouth every morning.      Melatonin 5 MG CAPS Take 5 mg by mouth  daily as needed (sleep).      Multiple Vitamin (MULTIVITAMIN WITH MINERALS) TABS tablet Take 1 tablet by mouth daily. Centrum Silver     Naphazoline HCl (CLEAR EYES OP) Place 1 drop into both eyes daily.     polyethylene glycol (MIRALAX / GLYCOLAX) packet Take 17 g by mouth daily as needed for mild constipation or moderate constipation (for constipation.).      Probiotic Product (PROBIOTIC PO) Take 1 capsule by mouth daily.     triamcinolone ointment (KENALOG) 0.5 % Apply 1 application topically 2 (two) times daily. Apply to rash twice a day 30 g 0   Current Facility-Administered Medications on File Prior to Visit  Medication Dose Route Frequency Provider Last Rate  Last Admin   heparin lock flush 100 unit/mL  500 Units Intracatheter PRN Lequita Asal, MD        Social History   Tobacco Use   Smoking status: Former Smoker    Packs/day: 0.25    Years: 10.00    Pack years: 2.50    Quit date: 1978    Years since quitting: 43.8   Smokeless tobacco: Never Used  Scientific laboratory technician Use: Never used  Substance Use Topics   Alcohol use: Yes    Alcohol/week: 1.0 standard drink    Types: 1 Glasses of wine per week    Comment: " occasional glass of wine "   Drug use: No    Review of Systems  Constitutional: Negative for chills, fever and unexpected weight change.  HENT: Negative for congestion.   Respiratory: Negative for cough.   Cardiovascular: Negative for chest pain, palpitations and leg swelling.  Gastrointestinal: Negative for nausea and vomiting.  Genitourinary: Negative for pelvic pain.  Musculoskeletal: Negative for arthralgias and myalgias.  Skin: Negative for rash.  Neurological: Positive for numbness. Negative for headaches.  Hematological: Negative for adenopathy.  Psychiatric/Behavioral: Negative for confusion and suicidal ideas.      Objective:    BP 118/76    Pulse 73    Temp 98.7 F (37.1 C)    Ht 5' 5"  (1.651 m)    Wt 147 lb 12.8 oz (67 kg)     LMP  (LMP Unknown)    SpO2 97%    BMI 24.60 kg/m   BP Readings from Last 3 Encounters:  09/08/20 118/76  08/23/20 116/80  08/21/20 121/77   Wt Readings from Last 3 Encounters:  09/08/20 147 lb 12.8 oz (67 kg)  07/26/20 143 lb 12.8 oz (65.2 kg)  06/26/20 139 lb 15.9 oz (63.5 kg)    Physical Exam Vitals reviewed.  Constitutional:      Appearance: She is well-developed.  Eyes:     Conjunctiva/sclera: Conjunctivae normal.  Neck:     Thyroid: No thyroid mass or thyromegaly.  Cardiovascular:     Rate and Rhythm: Normal rate and regular rhythm.     Pulses: Normal pulses.     Heart sounds: Normal heart sounds.  Pulmonary:     Effort: Pulmonary effort is normal.     Breath sounds: Normal breath sounds. No wheezing, rhonchi or rales.  Genitourinary:    Cervix: No cervical motion tenderness, discharge or friability.     Uterus: Not enlarged, not fixed and not tender.      Adnexa:        Right: No mass, tenderness or fullness.         Left: No mass, tenderness or fullness.       Comments: Pap performed. No CMT. Unable to appreciated ovaries. Lymphadenopathy:     Head:     Right side of head: No submental, submandibular, tonsillar, preauricular, posterior auricular or occipital adenopathy.     Left side of head: No submental, submandibular, tonsillar, preauricular, posterior auricular or occipital adenopathy.     Cervical:     Right cervical: No superficial, deep or posterior cervical adenopathy.    Left cervical: No superficial, deep or posterior cervical adenopathy.  Skin:    General: Skin is warm and dry.  Neurological:     Mental Status: She is alert.  Psychiatric:        Speech: Speech normal.        Behavior: Behavior normal.  Thought Content: Thought content normal.        Assessment & Plan:   Problem List Items Addressed This Visit      Nervous and Auditory   Peripheral neuropathy due to chemotherapy (HCC)    Stable. Continue gabapentin as prescribed by  Dr Mike Gip.        Other   Routine physical examination - Primary    Declines breast exam today. Double mastectomy and no longer requires mammogram. Encouraged continued exercise. Re-establish with dermatology and referral placed due to h/o BCC.      Relevant Orders   Ambulatory referral to Dermatology   Cytology - PAP   B12 and Folate Panel (Completed)   CBC with Differential/Platelet (Completed)   Comprehensive metabolic panel (Completed)   Hemoglobin A1c (Completed)   Lipid panel (Completed)   TSH (Completed)   VITAMIN D 25 Hydroxy (Vit-D Deficiency, Fractures) (Completed)   Iron, TIBC and Ferritin Panel (Completed)    Other Visit Diagnoses    Need for Tdap vaccination       Relevant Orders   Tdap vaccine greater than or equal to 7yo IM (Completed)       I am having Megan Vega maintain her b complex vitamins, acetaminophen, polyethylene glycol, multivitamin with minerals, Probiotic Product (PROBIOTIC PO), loratadine, Calcium-Magnesium-Vitamin D (CALCIUM 1200+D3 PO), Melatonin, Naphazoline HCl (CLEAR EYES OP), Investigational aspirin/placebo, triamcinolone ointment, ibuprofen, letrozole, and gabapentin.   No orders of the defined types were placed in this encounter.   Return precautions given.   Risks, benefits, and alternatives of the medications and treatment plan prescribed today were discussed, and patient expressed understanding.   Education regarding symptom management and diagnosis given to patient on AVS.   Continue to follow with Burnard Hawthorne, FNP for routine health maintenance.   Megan Vega and I agreed with plan.   Mable Paris, FNP

## 2020-09-08 NOTE — Patient Instructions (Signed)
Referral to dermatology Let us know if you dont hear back within a week in regards to an appointment being scheduled.   Nice to see you!   Health Maintenance for Postmenopausal Women Menopause is a normal process in which your ability to get pregnant comes to an end. This process happens slowly over many months or years, usually between the ages of 102 and 79. Menopause is complete when you have missed your menstrual periods for 12 months. It is important to talk with your health care provider about some of the most common conditions that affect women after menopause (postmenopausal women). These include heart disease, cancer, and bone loss (osteoporosis). Adopting a healthy lifestyle and getting preventive care can help to promote your health and wellness. The actions you take can also lower your chances of developing some of these common conditions. What should I know about menopause? During menopause, you may get a number of symptoms, such as:  Hot flashes. These can be moderate or severe.  Night sweats.  Decrease in sex drive.  Mood swings.  Headaches.  Tiredness.  Irritability.  Memory problems.  Insomnia. Choosing to treat or not to treat these symptoms is a decision that you make with your health care provider. Do I need hormone replacement therapy?  Hormone replacement therapy is effective in treating symptoms that are caused by menopause, such as hot flashes and night sweats.  Hormone replacement carries certain risks, especially as you become older. If you are thinking about using estrogen or estrogen with progestin, discuss the benefits and risks with your health care provider. What is my risk for heart disease and stroke? The risk of heart disease, heart attack, and stroke increases as you age. One of the causes may be a change in the body's hormones during menopause. This can affect how your body uses dietary fats, triglycerides, and cholesterol. Heart attack and  stroke are medical emergencies. There are many things that you can do to help prevent heart disease and stroke. Watch your blood pressure  High blood pressure causes heart disease and increases the risk of stroke. This is more likely to develop in people who have high blood pressure readings, are of African descent, or are overweight.  Have your blood pressure checked: ? Every 3-5 years if you are 61-8 years of age. ? Every year if you are 45 years old or older. Eat a healthy diet   Eat a diet that includes plenty of vegetables, fruits, low-fat dairy products, and lean protein.  Do not eat a lot of foods that are high in solid fats, added sugars, or sodium. Get regular exercise Get regular exercise. This is one of the most important things you can do for your health. Most adults should:  Try to exercise for at least 150 minutes each week. The exercise should increase your heart rate and make you sweat (moderate-intensity exercise).  Try to do strengthening exercises at least twice each week. Do these in addition to the moderate-intensity exercise.  Spend less time sitting. Even light physical activity can be beneficial. Other tips  Work with your health care provider to achieve or maintain a healthy weight.  Do not use any products that contain nicotine or tobacco, such as cigarettes, e-cigarettes, and chewing tobacco. If you need help quitting, ask your health care provider.  Know your numbers. Ask your health care provider to check your cholesterol and your blood sugar (glucose). Continue to have your blood tested as directed by your health care  provider. Do I need screening for cancer? Depending on your health history and family history, you may need to have cancer screening at different stages of your life. This may include screening for:  Breast cancer.  Cervical cancer.  Lung cancer.  Colorectal cancer. What is my risk for osteoporosis? After menopause, you may be at  increased risk for osteoporosis. Osteoporosis is a condition in which bone destruction happens more quickly than new bone creation. To help prevent osteoporosis or the bone fractures that can happen because of osteoporosis, you may take the following actions:  If you are 55-39 years old, get at least 1,000 mg of calcium and at least 600 mg of vitamin D per day.  If you are older than age 57 but younger than age 55, get at least 1,200 mg of calcium and at least 600 mg of vitamin D per day.  If you are older than age 52, get at least 1,200 mg of calcium and at least 800 mg of vitamin D per day. Smoking and drinking excessive alcohol increase the risk of osteoporosis. Eat foods that are rich in calcium and vitamin D, and do weight-bearing exercises several times each week as directed by your health care provider. How does menopause affect my mental health? Depression may occur at any age, but it is more common as you become older. Common symptoms of depression include:  Low or sad mood.  Changes in sleep patterns.  Changes in appetite or eating patterns.  Feeling an overall lack of motivation or enjoyment of activities that you previously enjoyed.  Frequent crying spells. Talk with your health care provider if you think that you are experiencing depression. General instructions See your health care provider for regular wellness exams and vaccines. This may include:  Scheduling regular health, dental, and eye exams.  Getting and maintaining your vaccines. These include: ? Influenza vaccine. Get this vaccine each year before the flu season begins. ? Pneumonia vaccine. ? Shingles vaccine. ? Tetanus, diphtheria, and pertussis (Tdap) booster vaccine. Your health care provider may also recommend other immunizations. Tell your health care provider if you have ever been abused or do not feel safe at home. Summary  Menopause is a normal process in which your ability to get pregnant comes to an  end.  This condition causes hot flashes, night sweats, decreased interest in sex, mood swings, headaches, or lack of sleep.  Treatment for this condition may include hormone replacement therapy.  Take actions to keep yourself healthy, including exercising regularly, eating a healthy diet, watching your weight, and checking your blood pressure and blood sugar levels.  Get screened for cancer and depression. Make sure that you are up to date with all your vaccines. This information is not intended to replace advice given to you by your health care provider. Make sure you discuss any questions you have with your health care provider. Document Revised: 10/28/2018 Document Reviewed: 10/28/2018 Elsevier Patient Education  2020 [employer].

## 2020-09-08 NOTE — Assessment & Plan Note (Addendum)
Declines breast exam today. Double mastectomy and no longer requires mammogram. Encouraged continued exercise. Re-establish with dermatology and referral placed due to h/o BCC.

## 2020-09-08 NOTE — Assessment & Plan Note (Addendum)
Stable. Continue gabapentin as prescribed by Dr Mike Gip.

## 2020-09-09 LAB — COMPREHENSIVE METABOLIC PANEL
AG Ratio: 1.7 (calc) (ref 1.0–2.5)
ALT: 13 U/L (ref 6–29)
AST: 24 U/L (ref 10–35)
Albumin: 4.2 g/dL (ref 3.6–5.1)
Alkaline phosphatase (APISO): 61 U/L (ref 37–153)
BUN: 10 mg/dL (ref 7–25)
CO2: 20 mmol/L (ref 20–32)
Calcium: 9.4 mg/dL (ref 8.6–10.4)
Chloride: 103 mmol/L (ref 98–110)
Creat: 0.91 mg/dL (ref 0.50–0.99)
Globulin: 2.5 g/dL (calc) (ref 1.9–3.7)
Glucose, Bld: 91 mg/dL (ref 65–99)
Potassium: 4 mmol/L (ref 3.5–5.3)
Sodium: 138 mmol/L (ref 135–146)
Total Bilirubin: 0.6 mg/dL (ref 0.2–1.2)
Total Protein: 6.7 g/dL (ref 6.1–8.1)

## 2020-09-09 LAB — CBC WITH DIFFERENTIAL/PLATELET
Absolute Monocytes: 464 cells/uL (ref 200–950)
Basophils Absolute: 61 cells/uL (ref 0–200)
Basophils Relative: 1.2 %
Eosinophils Absolute: 122 cells/uL (ref 15–500)
Eosinophils Relative: 2.4 %
HCT: 40.7 % (ref 35.0–45.0)
Hemoglobin: 13.5 g/dL (ref 11.7–15.5)
Lymphs Abs: 1647 cells/uL (ref 850–3900)
MCH: 28.2 pg (ref 27.0–33.0)
MCHC: 33.2 g/dL (ref 32.0–36.0)
MCV: 85.1 fL (ref 80.0–100.0)
MPV: 10 fL (ref 7.5–12.5)
Monocytes Relative: 9.1 %
Neutro Abs: 2805 cells/uL (ref 1500–7800)
Neutrophils Relative %: 55 %
Platelets: 256 10*3/uL (ref 140–400)
RBC: 4.78 10*6/uL (ref 3.80–5.10)
RDW: 12.8 % (ref 11.0–15.0)
Total Lymphocyte: 32.3 %
WBC: 5.1 10*3/uL (ref 3.8–10.8)

## 2020-09-09 LAB — TSH: TSH: 1.16 mIU/L (ref 0.40–4.50)

## 2020-09-09 LAB — HEMOGLOBIN A1C
Hgb A1c MFr Bld: 5.6 % of total Hgb (ref <5.7)
Mean Plasma Glucose: 114 (calc)
eAG (mmol/L): 6.3 (calc)

## 2020-09-09 LAB — B12 AND FOLATE PANEL
Folate: >24 ng/mL
Vitamin B-12: 602 pg/mL (ref 200–1100)

## 2020-09-09 LAB — LIPID PANEL
Cholesterol: 185 mg/dL (ref <200)
HDL: 54 mg/dL (ref >=50)
LDL Cholesterol (Calc): 110 mg/dL (calc) — ABNORMAL HIGH
Non-HDL Cholesterol (Calc): 131 mg/dL (calc) — ABNORMAL HIGH (ref <130)
Total CHOL/HDL Ratio: 3.4 (calc) (ref <5.0)
Triglycerides: 100 mg/dL (ref <150)

## 2020-09-09 LAB — IRON,TIBC AND FERRITIN PANEL
%SAT: 21 % (calc) (ref 16–45)
Ferritin: 17 ng/mL (ref 16–288)
Iron: 75 ug/dL (ref 45–160)
TIBC: 355 mcg/dL (calc) (ref 250–450)

## 2020-09-09 LAB — VITAMIN D 25 HYDROXY (VIT D DEFICIENCY, FRACTURES): Vit D, 25-Hydroxy: 50 ng/mL (ref 30–100)

## 2020-09-11 NOTE — Progress Notes (Signed)
Patient is a very sweet 64 year old female here for follow-up after undergoing removal of left breast tissue expander on 06/14/2020 with Dr. Marla Roe.  For the last several visit she had a small opening on both the right and left incisions approximately 0.5 cm long with serosanguineous drainage.  At her 9/14 visit the left breast wound had increased in size to 3 x 1.5 x 1 cm.  At last visit on 08/23/2020 the left breast incision wound had increased in size (3.5 x 2 x 0.5 cm) with serous drainage present.  Patient to apply endoform dressing every 3 to 4 days; applying K-Y jelly every other day.  ~ 13 weeks PO Patient reports she is doing well but feels there is no improvement to her wounds.  Left breast wound measures 3.5 x 2 cm and right breast wound measures 3 x 1 cm.  Both wounds show no signs of infection.  Clear serous drainage present bilaterally.  Patient denies fever/chills, nausea/vomiting.  Inferior portion of the latissimus flap incision on her back has acquired a small opening.  No signs of infection.  Clear drainage present. Patient has tried Cellerate and Endoform without significant improvement.   Continue current dressing changes. Follow up on Friday. Will discuss with Dr. Marla Roe to determine next steps.

## 2020-09-13 ENCOUNTER — Encounter: Payer: Self-pay | Admitting: Plastic Surgery

## 2020-09-13 ENCOUNTER — Ambulatory Visit (INDEPENDENT_AMBULATORY_CARE_PROVIDER_SITE_OTHER): Payer: 59 | Admitting: Plastic Surgery

## 2020-09-13 ENCOUNTER — Other Ambulatory Visit: Payer: Self-pay

## 2020-09-13 VITALS — BP 123/75 | HR 76 | Temp 98.2°F

## 2020-09-13 DIAGNOSIS — Z9013 Acquired absence of bilateral breasts and nipples: Secondary | ICD-10-CM

## 2020-09-13 DIAGNOSIS — S21001D Unspecified open wound of right breast, subsequent encounter: Secondary | ICD-10-CM

## 2020-09-13 DIAGNOSIS — S21002D Unspecified open wound of left breast, subsequent encounter: Secondary | ICD-10-CM

## 2020-09-13 LAB — CYTOLOGY - PAP
Comment: NEGATIVE
Diagnosis: NEGATIVE
High risk HPV: NEGATIVE

## 2020-09-13 NOTE — Progress Notes (Addendum)
Patient is a 64 year old female here for follow-up after undergoing removal of left breast tissue expander on 06/14/2020 with Dr. Marla Roe.  At her visit on 8/3 she had a small opening appear along the incision of the latissimus flap on her right breast.  At her visit on 8/13 she had a small opening appear along the incision of her left breast.  Over the last 2 months both wounds have increased in size and remained open. Left breast wound 3.5 x 2 cm; Right breast wound 3 x 1 cm.  She applied Cellerate for several weeks followed by endoform for several weeks.   ~ 13 weeks PO Patient reports she is feeling well today.  Denies fever/chills, nausea/vomiting.  No change in wounds since visit on 10/27.  Dr. Marla Roe was able to evaluate the wounds today and plan will be to return to the OR to close all 3 wounds (left breast, right breast, back latissimus flap incision).  We will try for next week if possible.  Continue daily dressing changes with Cellerate until surgery.  Follow-up as needed.  Call office with any questions/concerns.

## 2020-09-15 ENCOUNTER — Other Ambulatory Visit: Payer: Self-pay

## 2020-09-15 ENCOUNTER — Ambulatory Visit (INDEPENDENT_AMBULATORY_CARE_PROVIDER_SITE_OTHER): Payer: 59 | Admitting: Plastic Surgery

## 2020-09-15 ENCOUNTER — Encounter: Payer: Self-pay | Admitting: Plastic Surgery

## 2020-09-15 DIAGNOSIS — Z9013 Acquired absence of bilateral breasts and nipples: Secondary | ICD-10-CM

## 2020-09-15 DIAGNOSIS — S21001D Unspecified open wound of right breast, subsequent encounter: Secondary | ICD-10-CM

## 2020-09-15 DIAGNOSIS — S21002D Unspecified open wound of left breast, subsequent encounter: Secondary | ICD-10-CM

## 2020-09-18 ENCOUNTER — Telehealth: Payer: Self-pay | Admitting: Family

## 2020-09-18 NOTE — Telephone Encounter (Signed)
Patient was returning call for lab results 

## 2020-09-19 ENCOUNTER — Ambulatory Visit (INDEPENDENT_AMBULATORY_CARE_PROVIDER_SITE_OTHER): Payer: 59 | Admitting: Plastic Surgery

## 2020-09-19 ENCOUNTER — Encounter: Payer: Self-pay | Admitting: Plastic Surgery

## 2020-09-19 ENCOUNTER — Other Ambulatory Visit: Payer: Self-pay

## 2020-09-19 VITALS — BP 123/81 | HR 69 | Temp 98.2°F

## 2020-09-19 DIAGNOSIS — Z9013 Acquired absence of bilateral breasts and nipples: Secondary | ICD-10-CM

## 2020-09-19 NOTE — Progress Notes (Signed)
The patient is a 64 year old very sweet kind lady here for follow-up on her breast wounds.  She has a very small wound of the right medial breast that is at the tip of the latissimus flap.  The left side is at the lateral portion of the breast and is approximately 2 x 3 cm in size.  There is no drainage today.  She also has some skin irritation around the posterior inferior incision where the latissimus muscle was harvested.  We are planning on OR debridement and excision with closure of all 3 areas next week in [place].

## 2020-09-21 NOTE — Telephone Encounter (Signed)
Just FYI I spoke with patient & she prefers to wait a year before repeating PAP.

## 2020-09-22 ENCOUNTER — Encounter (HOSPITAL_BASED_OUTPATIENT_CLINIC_OR_DEPARTMENT_OTHER): Payer: Self-pay | Admitting: Plastic Surgery

## 2020-09-22 ENCOUNTER — Other Ambulatory Visit: Payer: Self-pay

## 2020-09-22 ENCOUNTER — Ambulatory Visit: Payer: 59 | Admitting: Plastic Surgery

## 2020-09-22 NOTE — Telephone Encounter (Signed)
noted 

## 2020-09-25 ENCOUNTER — Other Ambulatory Visit: Payer: 59

## 2020-09-28 ENCOUNTER — Other Ambulatory Visit: Payer: Self-pay

## 2020-09-28 ENCOUNTER — Other Ambulatory Visit
Admission: RE | Admit: 2020-09-28 | Discharge: 2020-09-28 | Disposition: A | Discharge status: Home / Self Care | Payer: 59 | Source: Ambulatory Visit | Attending: Plastic Surgery | Admitting: Plastic Surgery

## 2020-09-28 DIAGNOSIS — Z01812 Encounter for preprocedural laboratory examination: Secondary | ICD-10-CM | POA: Diagnosis present

## 2020-09-28 DIAGNOSIS — Z20822 Contact with and (suspected) exposure to covid-19: Secondary | ICD-10-CM | POA: Insufficient documentation

## 2020-09-29 LAB — SARS CORONAVIRUS 2 (TAT 6-24 HRS): SARS Coronavirus 2: NEGATIVE

## 2020-10-02 ENCOUNTER — Other Ambulatory Visit: Payer: Self-pay

## 2020-10-02 ENCOUNTER — Ambulatory Visit (HOSPITAL_BASED_OUTPATIENT_CLINIC_OR_DEPARTMENT_OTHER): Payer: 59 | Admitting: Certified Registered"

## 2020-10-02 ENCOUNTER — Encounter (HOSPITAL_BASED_OUTPATIENT_CLINIC_OR_DEPARTMENT_OTHER): Payer: Self-pay | Admitting: Plastic Surgery

## 2020-10-02 ENCOUNTER — Ambulatory Visit (HOSPITAL_BASED_OUTPATIENT_CLINIC_OR_DEPARTMENT_OTHER)
Admission: RE | Admit: 2020-10-02 | Discharge: 2020-10-02 | Disposition: A | Discharge status: Home / Self Care | Payer: 59 | Attending: Plastic Surgery | Admitting: Plastic Surgery

## 2020-10-02 ENCOUNTER — Encounter (HOSPITAL_BASED_OUTPATIENT_CLINIC_OR_DEPARTMENT_OTHER)
Admission: RE | Disposition: A | Discharge status: Home / Self Care | Payer: Self-pay | Source: Home / Self Care | Attending: Plastic Surgery

## 2020-10-02 DIAGNOSIS — Z8614 Personal history of Methicillin resistant Staphylococcus aureus infection: Secondary | ICD-10-CM | POA: Diagnosis not present

## 2020-10-02 DIAGNOSIS — Z7982 Long term (current) use of aspirin: Secondary | ICD-10-CM | POA: Diagnosis not present

## 2020-10-02 DIAGNOSIS — T8189XA Other complications of procedures, not elsewhere classified, initial encounter: Secondary | ICD-10-CM | POA: Diagnosis not present

## 2020-10-02 DIAGNOSIS — Z801 Family history of malignant neoplasm of trachea, bronchus and lung: Secondary | ICD-10-CM | POA: Diagnosis not present

## 2020-10-02 DIAGNOSIS — Z9221 Personal history of antineoplastic chemotherapy: Secondary | ICD-10-CM | POA: Insufficient documentation

## 2020-10-02 DIAGNOSIS — Z800 Family history of malignant neoplasm of digestive organs: Secondary | ICD-10-CM | POA: Insufficient documentation

## 2020-10-02 DIAGNOSIS — Z923 Personal history of irradiation: Secondary | ICD-10-CM | POA: Diagnosis not present

## 2020-10-02 DIAGNOSIS — Z87891 Personal history of nicotine dependence: Secondary | ICD-10-CM | POA: Insufficient documentation

## 2020-10-02 DIAGNOSIS — Z833 Family history of diabetes mellitus: Secondary | ICD-10-CM | POA: Diagnosis not present

## 2020-10-02 DIAGNOSIS — Z853 Personal history of malignant neoplasm of breast: Secondary | ICD-10-CM | POA: Diagnosis not present

## 2020-10-02 DIAGNOSIS — Z79899 Other long term (current) drug therapy: Secondary | ICD-10-CM | POA: Insufficient documentation

## 2020-10-02 DIAGNOSIS — Z91010 Allergy to peanuts: Secondary | ICD-10-CM | POA: Insufficient documentation

## 2020-10-02 DIAGNOSIS — Z9013 Acquired absence of bilateral breasts and nipples: Secondary | ICD-10-CM | POA: Insufficient documentation

## 2020-10-02 DIAGNOSIS — X58XXXA Exposure to other specified factors, initial encounter: Secondary | ICD-10-CM | POA: Diagnosis not present

## 2020-10-02 DIAGNOSIS — S21002A Unspecified open wound of left breast, initial encounter: Secondary | ICD-10-CM | POA: Diagnosis present

## 2020-10-02 DIAGNOSIS — Z8249 Family history of ischemic heart disease and other diseases of the circulatory system: Secondary | ICD-10-CM | POA: Insufficient documentation

## 2020-10-02 DIAGNOSIS — S21002D Unspecified open wound of left breast, subsequent encounter: Secondary | ICD-10-CM

## 2020-10-02 HISTORY — PX: DEBRIDEMENT AND CLOSURE WOUND: SHX5614

## 2020-10-02 SURGERY — DEBRIDEMENT, WOUND, WITH CLOSURE
Anesthesia: General | Site: Breast | Laterality: Bilateral

## 2020-10-02 MED ORDER — LIDOCAINE HCL (CARDIAC) PF 100 MG/5ML IV SOSY
PREFILLED_SYRINGE | INTRAVENOUS | Status: DC | PRN
  Administered 2020-10-02: 40 mg via INTRATRACHEAL

## 2020-10-02 MED ORDER — LACTATED RINGERS IV SOLN: INTRAVENOUS | Status: DC

## 2020-10-02 MED ORDER — PROPOFOL 10 MG/ML IV BOLUS
INTRAVENOUS | Status: AC
  Filled 2020-10-02: qty 20

## 2020-10-02 MED ORDER — OXYCODONE HCL 5 MG PO TABS: 5.0000 mg | ORAL_TABLET | Freq: Once | ORAL | Status: DC | PRN

## 2020-10-02 MED ORDER — DEXAMETHASONE SODIUM PHOSPHATE 4 MG/ML IJ SOLN
INTRAMUSCULAR | Status: DC | PRN
  Administered 2020-10-02: 10 mg via INTRAVENOUS

## 2020-10-02 MED ORDER — LIDOCAINE-EPINEPHRINE 1 %-1:100000 IJ SOLN
INTRAMUSCULAR | Status: DC | PRN
  Administered 2020-10-02: 8 mL

## 2020-10-02 MED ORDER — SODIUM CHLORIDE 0.9% FLUSH: 3.0000 mL | INTRAVENOUS | Status: DC | PRN

## 2020-10-02 MED ORDER — FENTANYL CITRATE (PF) 100 MCG/2ML IJ SOLN
INTRAMUSCULAR | Status: AC
  Filled 2020-10-02: qty 2

## 2020-10-02 MED ORDER — CEFAZOLIN SODIUM-DEXTROSE 2-4 GM/100ML-% IV SOLN
2.0000 g | INTRAVENOUS | Status: AC
  Administered 2020-10-02: 2 g via INTRAVENOUS

## 2020-10-02 MED ORDER — ACETAMINOPHEN 500 MG PO TABS: 1000.0000 mg | ORAL_TABLET | Freq: Once | ORAL | Status: DC | PRN

## 2020-10-02 MED ORDER — CEFAZOLIN SODIUM-DEXTROSE 2-4 GM/100ML-% IV SOLN
INTRAVENOUS | Status: AC
  Filled 2020-10-02: qty 100

## 2020-10-02 MED ORDER — OXYCODONE HCL 5 MG/5ML PO SOLN: 5.0000 mg | Freq: Once | ORAL | Status: DC | PRN

## 2020-10-02 MED ORDER — SODIUM CHLORIDE 0.9% FLUSH: 3.0000 mL | Freq: Two times a day (BID) | INTRAVENOUS | Status: DC

## 2020-10-02 MED ORDER — FENTANYL CITRATE (PF) 100 MCG/2ML IJ SOLN
INTRAMUSCULAR | Status: DC | PRN
Start: 2020-10-02 — End: 2020-10-02
  Administered 2020-10-02 (×4): 25 ug via INTRAVENOUS

## 2020-10-02 MED ORDER — EPHEDRINE SULFATE 50 MG/ML IJ SOLN
INTRAMUSCULAR | Status: DC | PRN
  Administered 2020-10-02: 15 mg via INTRAVENOUS
  Administered 2020-10-02: 10 mg via INTRAVENOUS

## 2020-10-02 MED ORDER — BUPIVACAINE HCL (PF) 0.25 % IJ SOLN
INTRAMUSCULAR | Status: AC
  Filled 2020-10-02: qty 120

## 2020-10-02 MED ORDER — LIDOCAINE-EPINEPHRINE 1 %-1:100000 IJ SOLN
INTRAMUSCULAR | Status: AC
  Filled 2020-10-02: qty 2

## 2020-10-02 MED ORDER — ACETAMINOPHEN 325 MG RE SUPP: 650.0000 mg | RECTAL | Status: DC | PRN

## 2020-10-02 MED ORDER — FENTANYL CITRATE (PF) 100 MCG/2ML IJ SOLN: 25.0000 ug | INTRAMUSCULAR | Status: DC | PRN

## 2020-10-02 MED ORDER — ACETAMINOPHEN 325 MG PO TABS: 650.0000 mg | ORAL_TABLET | ORAL | Status: DC | PRN

## 2020-10-02 MED ORDER — PROPOFOL 10 MG/ML IV BOLUS
INTRAVENOUS | Status: DC | PRN
  Administered 2020-10-02: 130 mg via INTRAVENOUS

## 2020-10-02 MED ORDER — SODIUM CHLORIDE 0.9 % IV SOLN: 250.0000 mL | INTRAVENOUS | Status: DC | PRN

## 2020-10-02 MED ORDER — MORPHINE SULFATE (PF) 4 MG/ML IV SOLN: 2.0000 mg | INTRAVENOUS | Status: DC | PRN

## 2020-10-02 MED ORDER — CHLORHEXIDINE GLUCONATE CLOTH 2 % EX PADS: 6.0000 | MEDICATED_PAD | Freq: Once | CUTANEOUS | Status: DC

## 2020-10-02 MED ORDER — ONDANSETRON HCL 4 MG PO TABS: 4.0000 mg | ORAL_TABLET | Freq: Three times a day (TID) | ORAL | 0 refills | Status: DC | PRN

## 2020-10-02 MED ORDER — OXYCODONE HCL 5 MG PO TABS: 5.0000 mg | ORAL_TABLET | ORAL | Status: DC | PRN

## 2020-10-02 MED ORDER — HYDROCODONE-ACETAMINOPHEN 5-325 MG PO TABS: 1.0000 | ORAL_TABLET | Freq: Four times a day (QID) | ORAL | 0 refills | Status: AC | PRN

## 2020-10-02 MED ORDER — ONDANSETRON HCL 4 MG/2ML IJ SOLN
INTRAMUSCULAR | Status: DC | PRN
  Administered 2020-10-02: 4 mg via INTRAVENOUS

## 2020-10-02 MED ORDER — ACETAMINOPHEN 160 MG/5ML PO SOLN: 1000.0000 mg | Freq: Once | ORAL | Status: DC | PRN

## 2020-10-02 MED ORDER — CEPHALEXIN 500 MG PO CAPS: 500.0000 mg | ORAL_CAPSULE | Freq: Four times a day (QID) | ORAL | 0 refills | Status: AC

## 2020-10-02 MED ORDER — BACITRACIN ZINC 500 UNIT/GM EX OINT
TOPICAL_OINTMENT | CUTANEOUS | Status: AC
  Filled 2020-10-02: qty 56.7

## 2020-10-02 MED ORDER — ACETAMINOPHEN 10 MG/ML IV SOLN: 1000.0000 mg | Freq: Once | INTRAVENOUS | Status: DC | PRN

## 2020-10-02 MED ORDER — MIDAZOLAM HCL 2 MG/2ML IJ SOLN
INTRAMUSCULAR | Status: AC
  Filled 2020-10-02: qty 2

## 2020-10-02 SURGICAL SUPPLY — 68 items
ADH SKN CLS APL DERMABOND .7 (GAUZE/BANDAGES/DRESSINGS)
BINDER BREAST 3XL (GAUZE/BANDAGES/DRESSINGS) IMPLANT
BINDER BREAST LRG (GAUZE/BANDAGES/DRESSINGS) IMPLANT
BINDER BREAST MEDIUM (GAUZE/BANDAGES/DRESSINGS) IMPLANT
BINDER BREAST XLRG (GAUZE/BANDAGES/DRESSINGS) IMPLANT
BINDER BREAST XXLRG (GAUZE/BANDAGES/DRESSINGS) IMPLANT
BIOPATCH RED 1 DISK 7.0 (GAUZE/BANDAGES/DRESSINGS) ×1 IMPLANT
BLADE CLIPPER SURG (BLADE) IMPLANT
BLADE HEX COATED 2.75 (ELECTRODE) ×2 IMPLANT
BLADE SURG 10 STRL SS (BLADE) IMPLANT
BLADE SURG 15 STRL LF DISP TIS (BLADE) ×1 IMPLANT
BLADE SURG 15 STRL SS (BLADE) ×2
BNDG GAUZE ELAST 4 BULKY (GAUZE/BANDAGES/DRESSINGS) IMPLANT
COVER BACK TABLE 60X90IN (DRAPES) ×2 IMPLANT
COVER MAYO STAND STRL (DRAPES) ×2 IMPLANT
COVER WAND RF STERILE (DRAPES) IMPLANT
DECANTER SPIKE VIAL GLASS SM (MISCELLANEOUS) IMPLANT
DERMABOND ADVANCED (GAUZE/BANDAGES/DRESSINGS)
DERMABOND ADVANCED .7 DNX12 (GAUZE/BANDAGES/DRESSINGS) IMPLANT
DRAIN CHANNEL 15F RND FF W/TCR (WOUND CARE) ×1 IMPLANT
DRAIN CHANNEL 19F RND (DRAIN) IMPLANT
DRAPE INCISE IOBAN 66X45 STRL (DRAPES) IMPLANT
DRAPE LAPAROSCOPIC ABDOMINAL (DRAPES) IMPLANT
DRAPE LAPAROTOMY 100X72 PEDS (DRAPES) IMPLANT
DRAPE SURG 17X23 STRL (DRAPES) IMPLANT
DRAPE U-SHAPE 76X120 STRL (DRAPES) IMPLANT
DRSG ADAPTIC 3X8 NADH LF (GAUZE/BANDAGES/DRESSINGS) IMPLANT
DRSG EMULSION OIL 3X3 NADH (GAUZE/BANDAGES/DRESSINGS) IMPLANT
DRSG HYDROCOLLOID 4X4 (GAUZE/BANDAGES/DRESSINGS) IMPLANT
DRSG PAD ABDOMINAL 8X10 ST (GAUZE/BANDAGES/DRESSINGS) IMPLANT
DRSG TEGADERM 4X10 (GAUZE/BANDAGES/DRESSINGS) IMPLANT
DRSG TELFA 3X8 NADH (GAUZE/BANDAGES/DRESSINGS) IMPLANT
ELECT REM PT RETURN 9FT ADLT (ELECTROSURGICAL) ×2
ELECTRODE REM PT RTRN 9FT ADLT (ELECTROSURGICAL) ×1 IMPLANT
EVACUATOR SILICONE 100CC (DRAIN) ×1 IMPLANT
GAUZE SPONGE 4X4 12PLY STRL (GAUZE/BANDAGES/DRESSINGS) IMPLANT
GAUZE XEROFORM 5X9 LF (GAUZE/BANDAGES/DRESSINGS) IMPLANT
GLOVE BIO SURGEON STRL SZ 6.5 (GLOVE) ×4 IMPLANT
GOWN STRL REUS W/ TWL LRG LVL3 (GOWN DISPOSABLE) ×2 IMPLANT
GOWN STRL REUS W/TWL LRG LVL3 (GOWN DISPOSABLE) ×4
HEMOSTAT SNOW SURGICEL 2X4 (HEMOSTASIS) ×1 IMPLANT
NDL HYPO 25X1 1.5 SAFETY (NEEDLE) ×1 IMPLANT
NEEDLE HYPO 25X1 1.5 SAFETY (NEEDLE) ×2 IMPLANT
NS IRRIG 1000ML POUR BTL (IV SOLUTION) ×2 IMPLANT
PACK BASIN DAY SURGERY FS (CUSTOM PROCEDURE TRAY) ×2 IMPLANT
PAD DRESSING TELFA 3X8 NADH (GAUZE/BANDAGES/DRESSINGS) IMPLANT
PENCIL SMOKE EVACUATOR (MISCELLANEOUS) ×2 IMPLANT
SHEET MEDIUM DRAPE 40X70 STRL (DRAPES) IMPLANT
SLEEVE SCD COMPRESS KNEE MED (MISCELLANEOUS) ×2 IMPLANT
SPONGE LAP 18X18 RF (DISPOSABLE) ×2 IMPLANT
SPONGE SURGIFOAM ABS GEL 12-7 (HEMOSTASIS) ×1 IMPLANT
STAPLER VISISTAT 35W (STAPLE) IMPLANT
SUT MNCRL AB 3-0 PS2 18 (SUTURE) ×2 IMPLANT
SUT MNCRL AB 4-0 PS2 18 (SUTURE) IMPLANT
SUT MON AB 2-0 CT1 36 (SUTURE) ×1 IMPLANT
SUT MON AB 3-0 SH 27 (SUTURE) ×2
SUT MON AB 3-0 SH27 (SUTURE) IMPLANT
SUT MON AB 5-0 PS2 18 (SUTURE) ×1 IMPLANT
SUT SILK 3 0 PS 1 (SUTURE) ×1 IMPLANT
SWAB COLLECTION DEVICE MRSA (MISCELLANEOUS) IMPLANT
SWAB CULTURE ESWAB REG 1ML (MISCELLANEOUS) IMPLANT
SYR BULB IRRIG 60ML STRL (SYRINGE) IMPLANT
SYR CONTROL 10ML LL (SYRINGE) ×2 IMPLANT
TOWEL GREEN STERILE FF (TOWEL DISPOSABLE) ×4 IMPLANT
TRAY DSU PREP LF (CUSTOM PROCEDURE TRAY) ×2 IMPLANT
TUBE CONNECTING 20X1/4 (TUBING) ×2 IMPLANT
UNDERPAD 30X36 HEAVY ABSORB (UNDERPADS AND DIAPERS) ×2 IMPLANT
YANKAUER SUCT BULB TIP NO VENT (SUCTIONS) ×2 IMPLANT

## 2020-10-02 NOTE — Anesthesia Procedure Notes (Signed)
Procedure Name: LMA Insertion Performed by: Shawnetta Lein M, CRNA Pre-anesthesia Checklist: Patient identified, Emergency Drugs available, Suction available and Patient being monitored Patient Re-evaluated:Patient Re-evaluated prior to induction Oxygen Delivery Method: Circle system utilized Preoxygenation: Pre-oxygenation with 100% oxygen Induction Type: IV induction Ventilation: Mask ventilation without difficulty LMA: LMA inserted LMA Size: 4.0 Number of attempts: 1 Airway Equipment and Method: Bite block Placement Confirmation: positive ETCO2 Tube secured with: Tape Dental Injury: Teeth and Oropharynx as per pre-operative assessment        

## 2020-10-02 NOTE — H&P (Signed)
Megan Vega is an 64 y.o. female.   Chief Complaint: breast wounds HPI: The patient is a very kind 64 yrs old wf here for treatment of her breast wounds.  She had breast cancer, reconstruction, implant failure and removal.  She presents with persistent wounds and the decision was made to excise the wounds with hopes of closure and healing.  Past Medical History:  Diagnosis Date  . Ankle fracture   . Breast cancer ([place])    bilateral  . Cancer ([place])    Basal Cell Carcinoma, about 2011 chest  . Cataract   . Hemorrhoids   . History of methicillin resistant staphylococcus aureus (MRSA) 2015  . Migraines    MIGRAINES occ  . Personal history of chemotherapy   . Personal history of radiation therapy   . Wears glasses     Past Surgical History:  Procedure Laterality Date  . BREAST BIOPSY Bilateral 10/29/2017   L breast DCIS, R breast invasive mammary carcinoma of no special type, ER/PR+, Her2/neu 1+  . BREAST RECONSTRUCTION Right 02/07/2020   Procedure: BREAST RECONSTRUCTION;  Surgeon: Wallace Going, DO;  Location: [facility];  Service: Plastics;  Laterality: Right;  . BREAST RECONSTRUCTION WITH PLACEMENT OF TISSUE EXPANDER AND FLEX HD (ACELLULAR HYDRATED DERMIS) Bilateral 08/30/2019   Procedure: BREAST RECONSTRUCTION WITH PLACEMENT OF TISSUE EXPANDER AND FLEX HD (ACELLULAR HYDRATED DERMIS);  Surgeon: Wallace Going, DO;  Location: [facility];  Service: Plastics;  Laterality: Bilateral;  2.5 hours, please  . CESAREAN SECTION    . COLONOSCOPY  2012  . COLONOSCOPY WITH PROPOFOL N/A 12/01/2018   Procedure: COLONOSCOPY WITH PROPOFOL;  Surgeon: Lucilla Lame, MD;  Location: [facility];  Service: Endoscopy;  Laterality: N/A;  . FRACTURE SURGERY     ankle  . IRRIGATION AND DEBRIDEMENT ABSCESS Right 03/12/2018   Procedure: IRRIGATION AND DEBRIDEMENT RIGHT AXILLARY ABSCESS;  Surgeon: Robert Bellow, MD;  Location: [facility];  Service: General;  Laterality: Right;  . LATISSIMUS FLAP TO  BREAST Right 02/07/2020   Procedure: LATISSIMUS MYOCUTANEOUS FLAP TO BREAST;  Surgeon: Wallace Going, DO;  Location: [place];  Service: Plastics;  Laterality: Right;  3 hours  . MANDIBLE FRACTURE SURGERY    . MASTECTOMY    . MASTECTOMY MODIFIED RADICAL Right 07/10/2018   ypT2 ypN2a ER/PR positive, HER-2/neu negative  Surgeon: Robert Bellow, MD;  Location: [facility];  Service: General;  Laterality: Right;  . PORT-A-CATH REMOVAL Left 02/07/2020   Procedure: REMOVAL PORT-A-CATH;  Surgeon: Wallace Going, DO;  Location: [place];  Service: Plastics;  Laterality: Left;  . PORTACATH PLACEMENT Left 11/28/2017   Procedure: INSERTION PORT-A-CATH;  Surgeon: Robert Bellow, MD;  Location: [facility];  Service: General;  Laterality: Left;  . REMOVAL OF TISSUE EXPANDER Right 06/05/2020   Procedure: TISSUE EXPANDER REMOVAL;  Surgeon: Wallace Going, DO;  Location: [facility];  Service: Plastics;  Laterality: Right;  . SIMPLE MASTECTOMY WITH AXILLARY SENTINEL NODE BIOPSY Left 07/10/2018   High-grade DCIS SIMPLE MASTECTOMY;  Surgeon: Robert Bellow, MD;  Location: [facility];  Service: General;  Laterality: Left;  . TISSUE EXPANDER PLACEMENT Left 06/05/2020   Procedure: TISSUE EXPANDER FLUID REMOVAL-100ML;  Surgeon: Wallace Going, DO;  Location: [facility];  Service: Plastics;  Laterality: Left;  . TISSUE EXPANDER PLACEMENT Left 06/14/2020   Procedure: TISSUE EXPANDER REMOVAL;  Surgeon: Wallace Going, DO;  Location: [place];  Service: Plastics;  Laterality: Left;  45 min, please    Family History  Problem Relation Age of Onset  . Cancer Mother        Esophageal Cancer  . Early death Mother        Age 2  . Cancer Father        Lung Cancer  . Diabetes Father   . Diabetes Brother        Controlled with diet  . Heart attack Paternal Grandfather   . Colon cancer Neg Hx    Social History:  reports that she quit smoking about 43 years ago. She has a 2.50 pack-year smoking history.  She has never used smokeless tobacco. She reports current alcohol use of about 1.0 standard drink of alcohol per week. She reports that she does not use drugs.  Allergies:  Allergies  Allergen Reactions  . Peanut-Containing Drug Products Swelling and Other (See Comments)    Only when eating too many peanuts, swelling with lips and tingly face    Medications Prior to Admission  Medication Sig Dispense Refill  . b complex vitamins tablet Take 1 tablet by mouth daily.    . Calcium-Magnesium-Vitamin D (CALCIUM 1200+D3 PO) Take 1,200 mg by mouth daily.     Marland Kitchen gabapentin (NEURONTIN) 300 MG capsule Take 1 capsule (300 mg total) by mouth 2 (two) times daily. 60 capsule 1  . Investigational aspirin/placebo 300 MG tablet Alliance [id number] Take 1 tablet by mouth daily. Take with food or a full glass of water.  Do not crush enteric coated tablets. (Patient taking differently: Take 300 tablets by mouth daily. Take with food or a full glass of water.  Do not crush enteric coated tablets.) 180 tablet 0  . letrozole (FEMARA) 2.5 MG tablet Take 1 tablet (2.5 mg total) by mouth daily. 30 tablet 3  . loratadine (CLARITIN) 10 MG tablet Take 10 mg by mouth every morning.     . Melatonin 5 MG CAPS Take 5 mg by mouth daily as needed (sleep).     . Multiple Vitamin (MULTIVITAMIN WITH MINERALS) TABS tablet Take 1 tablet by mouth daily. Centrum Silver    . Probiotic Product (PROBIOTIC PO) Take 1 capsule by mouth daily.    . Naphazoline HCl (CLEAR EYES OP) Place 1 drop into both eyes daily.      No results found for this or any previous visit (from the past 48 hour(s)). No results found.  Review of Systems  Constitutional: Negative.   Eyes: Negative.   Respiratory: Negative.   Cardiovascular: Negative.   Gastrointestinal: Negative.   Endocrine: Negative.   Genitourinary: Negative.     Height 5' 5"  (1.651 m), weight 66.7 kg. Physical Exam Vitals and nursing note reviewed.  Constitutional:      Appearance:  Normal appearance.  HENT:     Head: Normocephalic and atraumatic.  Cardiovascular:     Rate and Rhythm: Normal rate.     Pulses: Normal pulses.  Abdominal:     General: Abdomen is flat.  Neurological:     General: No focal deficit present.     Mental Status: She is alert and oriented to person, place, and time.  Psychiatric:        Mood and Affect: Mood normal.      Assessment/Plan Plan for excision and closure of breast wounds.  The risks that can be encountered with surgery were discussed and include the following but not limited to these: bleeding, infection, delayed healing, anesthesia risks, skin sensation changes, injury to structures including nerves, blood vessels, and muscles which may  be temporary or permanent, allergies to tape, suture materials and glues, blood products, topical preparations or injected agents, skin contour irregularities, skin discoloration and swelling, deep vein thrombosis, cardiac and pulmonary complications, pain, which may persist, fluid accumulation, persistent pain, formation of tight scar tissue and possible need for revisional surgery or staged procedures.     [place], DO 10/02/2020, 7:21 AM

## 2020-10-02 NOTE — Transfer of Care (Signed)
Immediate Anesthesia Transfer of Care Note  Patient: Megan Vega  Procedure(s) Performed: Excision of bilateral breast wound (Bilateral Breast)  Patient Location: PACU  Anesthesia Type:General  Level of Consciousness: awake, alert  and oriented  Airway & Oxygen Therapy: Patient Spontanous Breathing and Patient connected to face mask oxygen  Post-op Assessment: Report given to RN and Post -op Vital signs reviewed and stable  Post vital signs: Reviewed and stable  Last Vitals:  Vitals Value Taken Time  BP    Temp    Pulse    Resp    SpO2      Last Pain:  Vitals:   10/02/20 0744  TempSrc: Oral  PainSc: 0-No pain         Complications: No complications documented.

## 2020-10-02 NOTE — Discharge Instructions (Signed)
[facility] Plastic Surgery Specialist  What is the benefit of having a drain?  During surgery your tissue layers are separated.  This raw surface stimulates your body to fill the space with serous fluid.  This is normal but you don't want that fluid to collect and prevent healing.  A fluid collection can also become infected.  The Jackson-Pratt (JP) drain is used to eliminate this collection of fluid and allow the tissue to heal together.    Jackson-Pratt (JP) bulb    How to care for your drainage and suction unit at home Your drainage catheter will be connected to a collection device. The vacuum caused when the device is compressed allows drainage to collect in the device.    Wendee Copp your hands with soap and water before and after touching the system. . Empty the JP drain every 12 hours once you get home from your procedure. . Record the fluid amount on the record sheet included. . Start with stripping the drain tube to push the clots or excess fluid to the bulb.  Do this by pinching the tube with one hand near your skin.  Then with the other hand squeeze the tubing and work it toward the bulb.  This should be done several times a day.  This may collapse the tube which will correct on its own.   . Use a safety pin to attach your collection device to your clothing so there is no tension on the insertion site.   . If you have drainage at the skin insertion site, you can apply a gauze dressing and secure it with tape. . If the drain falls out, apply a gauze dressing over the drain insertion site and secure with tape.   To empty the collection device:   . Release the stopper on the top of the collection unit (bulb).  Signa Kell contents into a measuring container such as a plastic medicine cup.  . Record the day and amount of drainage on the attached sheet. . This should be done at least twice a day.    To compress the Jackson-Pratt Bulb:  . Release the stopper at the top of the bulb. Marland Kitchen Squeeze the bulb  tightly in your fist, squeezing air out of the bulb.  . Replace the stopper while the bulb is compressed.  . Be careful not to spill the contents when squeezing the bulb. . The drainage will start bright red and turn to pink and then yellow with time. . IMPORTANT: If the bulb is not squeezed before adding the stopper it will not draw out the fluid.  Care for the JP drain site and your skin daily:  . You may shower three days after surgery. . Secure the drain to a ribbon or cloth around your waist while showering so it does not pull out while showering. . Be sure your hands are cleaned with soap and water. . Use a clean wet cotton swab to clean the skin around the drain site.  . Use another cotton swab to place Vaseline or antibiotic ointment on the skin around the drain.     Contact your physician if any of the following occur:  Marland Kitchen The fluid in the bulb becomes cloudy. . Your temperature is greater than 101.4.  Marland Kitchen The incision opens. . If you have drainage at the skin insertion site, you can apply a gauze dressing and secure it with tape. . If the drain falls out, apply a gauze dressing over the  drain insertion site and secure with tape.  . You will usually have more drainage when you are active than while you rest or are asleep. If the drainage increases significantly or is bloody call the physician                             Bring this record with you to each office visit Date  Drainage Volume  Date   Drainage volume                                                                                                                                                                                             Post Anesthesia Home Care Instructions  Activity: Get plenty of rest for the remainder of the day. A responsible individual must stay with you for 24 hours following the procedure.  For the next 24 hours, DO NOT: -Drive a car -Paediatric nurse -Drink  alcoholic beverages -Take any medication unless instructed by your physician -Make any legal decisions or sign important papers.  Meals: Start with liquid foods such as gelatin or soup. Progress to regular foods as tolerated. Avoid greasy, spicy, heavy foods. If nausea and/or vomiting occur, drink only clear liquids until the nausea and/or vomiting subsides. Call your physician if vomiting continues.  Special Instructions/Symptoms: Your throat may feel dry or sore from the anesthesia or the breathing tube placed in your throat during surgery. If this causes discomfort, gargle with warm salt water. The discomfort should disappear within 24 hours.  If you had a scopolamine patch placed behind your ear for the management of post- operative nausea and/or vomiting:  1. The medication in the patch is effective for 72 hours, after which it should be removed.  Wrap patch in a tissue and discard in the trash. Wash hands thoroughly with soap and water. 2. You may remove the patch earlier than 72 hours if you experience unpleasant side effects which may include dry mouth, dizziness or visual disturbances. 3. Avoid touching the patch. Wash your hands with soap and water after contact with the patch.

## 2020-10-02 NOTE — Anesthesia Preprocedure Evaluation (Signed)
Anesthesia Evaluation  Patient identified by MRN, date of birth, ID band Patient awake    Reviewed: Allergy & Precautions, NPO status , Patient's Chart, lab work & pertinent test results  History of Anesthesia Complications Negative for: history of anesthetic complications  Airway Mallampati: II  TM Distance: >3 FB Neck ROM: Full    Dental  (+) Dental Advisory Given, Teeth Intact   Pulmonary neg shortness of breath, neg sleep apnea, neg COPD, neg recent URI, former smoker,  Covid-19 Nucleic Acid Test Results Lab Results      Component                Value               Date                      [place]              NEGATIVE            09/28/2020                [place]              NEGATIVE            06/13/2020                [place]              NEGATIVE            06/01/2020                [place]              NEGATIVE            02/03/2020                [place]              NEGATIVE            08/26/2019              breath sounds clear to auscultation       Cardiovascular negative cardio ROS   Rhythm:Regular     Neuro/Psych  Headaches, neg Seizures  Neuromuscular disease negative psych ROS   GI/Hepatic negative GI ROS, Neg liver ROS,   Endo/Other  negative endocrine ROS  Renal/GU negative Renal ROS     Musculoskeletal negative musculoskeletal ROS (+)   Abdominal   Peds  Hematology negative hematology ROS (+)   Anesthesia Other Findings   Reproductive/Obstetrics                             Anesthesia Physical Anesthesia Plan  ASA: II  Anesthesia Plan: General   Post-op Pain Management:    Induction: Intravenous  PONV Risk Score and Plan: 3 and Ondansetron, Dexamethasone and Propofol infusion  Airway Management Planned: LMA  Additional Equipment: None  Intra-op Plan:   Post-operative Plan: Extubation in OR  Informed Consent: I have reviewed the  patients History and Physical, chart, labs and discussed the procedure including the risks, benefits and alternatives for the proposed anesthesia with the patient or authorized representative who has indicated his/her understanding and acceptance.     Dental advisory given  Plan Discussed with: CRNA and Surgeon  Anesthesia Plan Comments:         Anesthesia Quick Evaluation

## 2020-10-02 NOTE — Op Note (Signed)
DATE OF OPERATION: 10/02/2020  LOCATION: [facility]  PREOPERATIVE DIAGNOSIS: bilateral breast wounds  POSTOPERATIVE DIAGNOSIS: Same  PROCEDURE:  1. Excision of left breast wound 3 x 3 cm with primary closure 2. Excision of right breast wound 2 x 4 cm  SURGEON: Cordon Gassett [place] Rahi Chandonnet, DO  ASSISTANT: Roetta Sessions, PA  EBL: 5 cc  CONDITION: Stable  COMPLICATIONS: None  INDICATION: The patient, Megan Vega, is a 64 y.o. female born on 12/26/1955, is here for treatment of bilateral breast wounds after breast cancer treatment and attempted reconstruction.   PROCEDURE DETAILS:  The patient was seen prior to surgery and marked.  The IV antibiotics were given. The patient was taken to the operating room and given a general anesthetic. A standard time out was performed and all information was confirmed by those in the room. SCDs were placed.   The patient was prepped and draped in usual sterile fashion.  Lidocaine with epinephrine was injected around each periwound area.  Right breast: The area around the wound was marked with a marking pen.  #10 blade was used to excise the 2 x 4 cm area.  There was fibrous tissue and some fat necrosis that was under the skin.  This tunnel for approximately 6 cm laterally.  I went ahead and opened this up with the 10 blade.  This was within the previous incision site from the latissimus muscle flap.  I dissected down to the fibrous tissue and excised with a #10 blade and the Bovie.  I was able to get to the end of the tunneling.  There was no purulence and no sign of infection.  The area was irrigated with copious amounts of irrigation.  Hemostasis was achieved with electrocautery.  I was able to advance the latissimus muscle more medially to get it to the medial aspect when evaluating to help.  Drain was placed and secured to the chest wall with a 3-0 silk.  I tacked this in place with 2-0 Monocryl.  The remaining area was closed with  3-0 Monocryl and 4-0 Monocryl.  Dermabond and Steri-Strips were applied with a sterile dressing.  Left breast: Local with epinephrine was injected around the 3 x 3 cm opening.  This was excised with a #10 blade.  The Bovie was used to excise the tissue off of the chest wall that had been exposed.  This was sent to pathology just as a precaution due to her breast cancer history.  The area was irrigated with copious amounts of saline.  Undermining was done to free the pectoralis muscle from the flap.  This was done to help gain closure with less tension.  Deep layers were placed with 3-0 Monocryl.  Thrombus that was placed as a precaution.  The remaining layers were closed with 3-0 Monocryl.  Dermabond was applied with Steri-Strips.  A sterile dressing was applied and the patient was placed in a breast binder.  The patient was allowed to wake up and taken to recovery room in stable condition at the end of the case. The family was notified at the end of the case.   The advanced practice practitioner (APP) assisted throughout the case.  The APP was essential in retraction and counter traction when needed to make the case progress smoothly.  This retraction and assistance made it possible to see the tissue plans for the procedure.  The assistance was needed for blood control, tissue re-approximation and assisted with closure of the incision site.

## 2020-10-02 NOTE — Anesthesia Postprocedure Evaluation (Signed)
Anesthesia Post Note  Patient: Megan Vega  Procedure(s) Performed: Excision of bilateral breast wound (Bilateral Breast)     Patient location during evaluation: PACU Anesthesia Type: General Level of consciousness: awake and alert Pain management: pain level controlled Vital Signs Assessment: post-procedure vital signs reviewed and stable Respiratory status: spontaneous breathing, nonlabored ventilation, respiratory function stable and patient connected to nasal cannula oxygen Cardiovascular status: blood pressure returned to baseline and stable Postop Assessment: no apparent nausea or vomiting Anesthetic complications: no   No complications documented.  Last Vitals:  Vitals:   10/02/20 1015 10/02/20 1025  BP: 114/77 112/80  Pulse: 91 93  Resp: 12 18  Temp:  37.1 C  SpO2: 97% 98%    Last Pain:  Vitals:   10/02/20 1025  TempSrc:   PainSc: 0-No pain                 Deboraha Goar

## 2020-10-03 LAB — SURGICAL PATHOLOGY

## 2020-10-04 ENCOUNTER — Encounter (HOSPITAL_BASED_OUTPATIENT_CLINIC_OR_DEPARTMENT_OTHER): Payer: Self-pay | Admitting: Plastic Surgery

## 2020-10-09 ENCOUNTER — Encounter: Payer: 59 | Admitting: Surgical

## 2020-10-10 ENCOUNTER — Ambulatory Visit (INDEPENDENT_AMBULATORY_CARE_PROVIDER_SITE_OTHER): Payer: 59 | Admitting: Surgical

## 2020-10-10 ENCOUNTER — Other Ambulatory Visit: Payer: Self-pay

## 2020-10-10 ENCOUNTER — Encounter: Payer: Self-pay | Admitting: Surgical

## 2020-10-10 VITALS — BP 124/74 | HR 80 | Temp 98.2°F

## 2020-10-10 DIAGNOSIS — Z9013 Acquired absence of bilateral breasts and nipples: Secondary | ICD-10-CM

## 2020-10-10 NOTE — Progress Notes (Signed)
Patient is a 64 year old female here for follow-up after excision of left breast wound with primary closure and excision of right breast wound on 10/02/2020 with Dr. Marla Roe.  She reports that she is doing well.  She reports that the drainage from the right JP drain has been so small it is and measurable.  She reports that pain is well controlled.  She still has the honeycomb and Steri-Strip dressings in place.  She reports that she is pleased so far with how things are going. Chaperone present on exam Honeycomb and Steri-Strip dressings in place, no erythema or cellulitic changes.  No swelling noted.  No fluid wave noted with palpation.  Recommend leaving honeycomb dressings in place for 1 more week, will remove at follow-up in 1 week or patient can remove as able.  Continue to wear compressive garment.  Continue to avoid strenuous activity.  Follow-up in 1 week for reevaluation.  No sign of infection

## 2020-10-15 NOTE — Progress Notes (Signed)
Patient is a 64 year old female here for follow-up after excision of left breast wound with primary closure and excision of right breast wound on 10/02/2020 with Dr. Marla Roe.  ~ 2.5 weeks PO Patient reports she is doing well overall.  Denies fever/chills, nausea/vomiting, pain at incision sites.  Honeycomb dressings removed from bilateral breasts.  Steri-Strips in place.  Bilateral incisions appear intact.  No signs of infection, current drainage, redness, seroma/hematoma.  Fluid pocket on the back has drained and appears to be healed.  She may continue to shower avoiding direct spray on the incision sites.  Continue to wear compression garment.  Continue to avoid strenuous activity.  Follow-up in 2 weeks.  Return precautions provided.  Call office with any questions/concerns.

## 2020-10-18 ENCOUNTER — Telehealth: Payer: Self-pay

## 2020-10-18 DIAGNOSIS — C50411 Malignant neoplasm of upper-outer quadrant of right female breast: Secondary | ICD-10-CM

## 2020-10-18 NOTE — Telephone Encounter (Cosign Needed)
[id number] "[name]" Study Discontinuation Notification:  Research RN attempted to speak with the patient via telephone at this time, message left for the patient to return call at her earliest convenience for some important protocol information. Megan Vega 10/18/20 1:58 PM

## 2020-10-19 ENCOUNTER — Other Ambulatory Visit: Payer: Self-pay

## 2020-10-19 ENCOUNTER — Encounter: Payer: Self-pay | Admitting: Plastic Surgery

## 2020-10-19 ENCOUNTER — Telehealth: Payer: Self-pay

## 2020-10-19 ENCOUNTER — Ambulatory Visit (INDEPENDENT_AMBULATORY_CARE_PROVIDER_SITE_OTHER): Payer: 59 | Admitting: Plastic Surgery

## 2020-10-19 VITALS — BP 129/71 | HR 77 | Temp 98.5°F

## 2020-10-19 DIAGNOSIS — C50411 Malignant neoplasm of upper-outer quadrant of right female breast: Secondary | ICD-10-CM

## 2020-10-19 DIAGNOSIS — S21001D Unspecified open wound of right breast, subsequent encounter: Secondary | ICD-10-CM

## 2020-10-19 DIAGNOSIS — S21002D Unspecified open wound of left breast, subsequent encounter: Secondary | ICD-10-CM

## 2020-10-19 DIAGNOSIS — Z9013 Acquired absence of bilateral breasts and nipples: Secondary | ICD-10-CM

## 2020-10-19 NOTE — Telephone Encounter (Cosign Needed)
[id number] "[name]" Study Discontinuation:  Research RN called patient again today and spoke with her regarding the [id number] "[name]" protocol. Patient was informed that an interim analysis completed recently found that the participants treated with aspirin did not do better than participants taking the placebo with regard to the rate of their cancer coming back. Because of this result, it is recommended that all study participants stop taking study drug at this time, and the study is being stopped. The patient was informed that the study drug she has and the medication logs will need to be returned to the research department at her earliest convenience. The patient was encouraged to continue any cancer treatment that she may currently be receiving and continue her follow ups with her doctor. The patient is aware that the study will continue to monitor her at study specified times until otherwise notified. The patient denies having any questions and states that the research nurse explained everything very clearly. Encouraged the patient to call the research department or speak with her physician if she has any further questions or concerns.  Jeral Fruit 10/19/20 11:10 AM

## 2020-10-20 ENCOUNTER — Encounter (INDEPENDENT_AMBULATORY_CARE_PROVIDER_SITE_OTHER): Payer: Self-pay

## 2020-11-07 ENCOUNTER — Other Ambulatory Visit: Payer: Self-pay

## 2020-11-07 ENCOUNTER — Encounter: Payer: Self-pay | Admitting: Surgical

## 2020-11-07 ENCOUNTER — Ambulatory Visit (INDEPENDENT_AMBULATORY_CARE_PROVIDER_SITE_OTHER): Payer: 59 | Admitting: Surgical

## 2020-11-07 VITALS — BP 115/75 | HR 63 | Temp 98.7°F

## 2020-11-07 DIAGNOSIS — Z9013 Acquired absence of bilateral breasts and nipples: Secondary | ICD-10-CM

## 2020-11-07 DIAGNOSIS — S21002D Unspecified open wound of left breast, subsequent encounter: Secondary | ICD-10-CM

## 2020-11-07 DIAGNOSIS — S21001D Unspecified open wound of right breast, subsequent encounter: Secondary | ICD-10-CM

## 2020-11-07 NOTE — Progress Notes (Signed)
Patient is a 64 year old female here for follow-up after excision of left breast wound with primary closure and excision of right breast wound with closure on 10/02/2020 with Dr. Marla Roe.  Patient previously underwent removal of left breast tissue expander on 06/14/2020 with Dr. Marla Roe.  Prior to this she had removal of the right tissue expander on 06/05/2020.  She has a history of a right breast latissimus myocutaneous flap on 02/07/2020.   She is here today for evaluation after excision of the bilateral breast wounds with closure.  She is 5 weeks postop.  She reports that she has been doing well.  She is very pleased with how things have healed so far.  She has not had any infectious symptoms.  She does report that she has been to [facility] and is very pleased with what they had to offer for prosthesis and postmastectomy/breast cancer patients.  Chaperone present on exam On exam right latissimus myocutaneous flap/mastectomy incisions noted.  Latissimus skin paddle with good color and capillary refill.  No wounds noted.  Left mastectomy incision CDI.  No wounds noted.  No erythema of either breast noted.  No fluid noted with palpation.  Bilateral breasts are nontender to palpation.  Recommend continue her compressive garment for 1 more week, she can then transition to wearing throughout the day. 1 more week of lifting restrictions.  There is no sign of infection, seroma, hematoma. I recommend she calls [place] with any questions or concerns.  We will schedule a follow-up for 2 weeks to reevaluate.  I discussed with the patient that if she would like we could do this via virtual visit or in person depending on her preference.  She knows to call with any questions or concerns or if she has any changes prior to her follow-up.

## 2020-12-10 ENCOUNTER — Other Ambulatory Visit: Payer: Self-pay | Admitting: Hematology and Oncology

## 2020-12-10 DIAGNOSIS — G620 Drug-induced polyneuropathy: Secondary | ICD-10-CM

## 2020-12-10 DIAGNOSIS — T451X5A Adverse effect of antineoplastic and immunosuppressive drugs, initial encounter: Secondary | ICD-10-CM

## 2020-12-12 ENCOUNTER — Telehealth: Payer: Self-pay

## 2020-12-12 NOTE — Telephone Encounter (Signed)
T/C to patient in regards to her upcoming visit with Dr. Mike Gip in February. Research nurse reminded her to bring in all unused study drug and medication diaries to return to the study. The patient states that she will come her to [facility] after her appointment with Dr. Mike Gip around 230 pm. Research nurse will meet her in the lobby and escort her to a private area for this meeting.  Jeral Fruit, BSN, OCN 12/12/20 10:32 AM

## 2020-12-13 ENCOUNTER — Other Ambulatory Visit: Payer: Self-pay

## 2020-12-22 ENCOUNTER — Other Ambulatory Visit: Payer: Self-pay

## 2020-12-22 DIAGNOSIS — C50411 Malignant neoplasm of upper-outer quadrant of right female breast: Secondary | ICD-10-CM

## 2020-12-25 ENCOUNTER — Telehealth: Payer: Self-pay

## 2020-12-25 ENCOUNTER — Other Ambulatory Visit: Payer: Self-pay

## 2020-12-25 DIAGNOSIS — C50411 Malignant neoplasm of upper-outer quadrant of right female breast: Secondary | ICD-10-CM

## 2020-12-25 NOTE — Telephone Encounter (Signed)
Research nurse called the patient to update her concerning a change in her appointments for Wednesday, February 9th. Research needed to move the lab appointments to [place] from [facility] due to research labs that need to be drawn and shipped the same day prior to 230 pm. The patient verbalized understanding and states the times are alright with her for her appointments on Wednesday.   Jeral Fruit, RN 12/25/20 12:28 PM

## 2020-12-26 NOTE — Progress Notes (Signed)
[facility]  [address], [address] [place], [place] [address] Phone: [phone]  Fax: [phone]   Clinic Day:  12/27/2020  Referring physician: Burnard Hawthorne, FNP  Chief Complaint: Megan Vega is a 65 y.o. female with IIIB right breast cancer and DCIS of the left breast who is seen for 6 month assessment.   HPI: The patient was last seen in the medical oncology clinic on 06/26/2020. At that time, she noted mild lymphedema.  Neuropathy was persistent.  Exam revealed no evidence of recurrent disease. Hematocrit was 33.5, hemoglobin 10.6, MCV 86.1, platelets 283,000, WBC 4,700. CA27.29 was 5.8. She continued Femara, calcium, and vitamin D.  She received Zometa on 08/21/2020.  CBC on 09/08/2020 included a hematocrit of 40.7, hemoglobin 13.5, MCV 85.1, platelets 256,000, WBC 5100.  Ferritin was 17 with an iron saturation of 21% and a TIBC of 355.  B12 was 602 and folate > 24.   She underwent excision of left breast wound with closure on 10/02/2020 by Dr. Marla Roe. Pathology showed inflamed granulation tissue and fibrosis. There was no evidence of malignancy.  She saw Roetta Sessions, PA on 11/07/2020. She was 5 weeks post-op. She was feeling well and was pleased with the results. Follow-up was planned for 2 weeks.  During the interim, she has been "good." Her lymphedema has resolved. She still has neuropathy in the ends of her fingers and toes and takes gabapentin 300 mg BID. She eats an iron-rich diet and denies bleeding.  She performs monthly breat self exams and has no concerns. She is on Femara.   Past Medical History:  Diagnosis Date  . Ankle fracture   . Breast cancer ([place])    bilateral  . Cancer ([place])    Basal Cell Carcinoma, about 2011 chest  . Cataract   . Family history of adverse reaction to anesthesia    daughter with PONV  . Hemorrhoids   . History of methicillin resistant staphylococcus aureus (MRSA) 2015  . Migraines     MIGRAINES occ  . Personal history of chemotherapy   . Personal history of radiation therapy   . Wears glasses     Past Surgical History:  Procedure Laterality Date  . BREAST BIOPSY Bilateral 10/29/2017   L breast DCIS, R breast invasive mammary carcinoma of no special type, ER/PR+, Her2/neu 1+  . BREAST RECONSTRUCTION Right 02/07/2020   Procedure: BREAST RECONSTRUCTION;  Surgeon: Wallace Going, DO;  Location: [facility];  Service: Plastics;  Laterality: Right;  . BREAST RECONSTRUCTION WITH PLACEMENT OF TISSUE EXPANDER AND FLEX HD (ACELLULAR HYDRATED DERMIS) Bilateral 08/30/2019   Procedure: BREAST RECONSTRUCTION WITH PLACEMENT OF TISSUE EXPANDER AND FLEX HD (ACELLULAR HYDRATED DERMIS);  Surgeon: Wallace Going, DO;  Location: [facility];  Service: Plastics;  Laterality: Bilateral;  2.5 hours, please  . CESAREAN SECTION    . COLONOSCOPY  2012  . COLONOSCOPY WITH PROPOFOL N/A 12/01/2018   Procedure: COLONOSCOPY WITH PROPOFOL;  Surgeon: Lucilla Lame, MD;  Location: [facility];  Service: Endoscopy;  Laterality: N/A;  . DEBRIDEMENT AND CLOSURE WOUND Bilateral 10/02/2020   Procedure: Excision of bilateral breast wound;  Surgeon: Wallace Going, DO;  Location: [facility];  Service: Plastics;  Laterality: Bilateral;  1.5 hours, please  . FRACTURE SURGERY     ankle  . IRRIGATION AND DEBRIDEMENT ABSCESS Right 03/12/2018   Procedure: IRRIGATION AND DEBRIDEMENT RIGHT AXILLARY ABSCESS;  Surgeon: Robert Bellow, MD;  Location: [facility];  Service: General;  Laterality:  Right;  Marland Kitchen LATISSIMUS FLAP TO BREAST Right 02/07/2020   Procedure: LATISSIMUS MYOCUTANEOUS FLAP TO BREAST;  Surgeon: Wallace Going, DO;  Location: [facility];  Service: Plastics;  Laterality: Right;  3 hours  . MANDIBLE FRACTURE SURGERY    . MASTECTOMY    . MASTECTOMY MODIFIED RADICAL Right 07/10/2018   ypT2 ypN2a ER/PR positive, HER-2/neu negative  Surgeon: Robert Bellow, MD;  Location: [facility];   Service: General;  Laterality: Right;  . PORT-A-CATH REMOVAL Left 02/07/2020   Procedure: REMOVAL PORT-A-CATH;  Surgeon: Wallace Going, DO;  Location: [facility];  Service: Plastics;  Laterality: Left;  . PORTACATH PLACEMENT Left 11/28/2017   Procedure: INSERTION PORT-A-CATH;  Surgeon: Robert Bellow, MD;  Location: [facility];  Service: General;  Laterality: Left;  . REMOVAL OF TISSUE EXPANDER Right 06/05/2020   Procedure: TISSUE EXPANDER REMOVAL;  Surgeon: Wallace Going, DO;  Location: [facility];  Service: Plastics;  Laterality: Right;  . SIMPLE MASTECTOMY WITH AXILLARY SENTINEL NODE BIOPSY Left 07/10/2018   High-grade DCIS SIMPLE MASTECTOMY;  Surgeon: Robert Bellow, MD;  Location: [facility];  Service: General;  Laterality: Left;  . TISSUE EXPANDER PLACEMENT Left 06/05/2020   Procedure: TISSUE EXPANDER FLUID REMOVAL-100ML;  Surgeon: Wallace Going, DO;  Location: [facility];  Service: Plastics;  Laterality: Left;  . TISSUE EXPANDER PLACEMENT Left 06/14/2020   Procedure: TISSUE EXPANDER REMOVAL;  Surgeon: Wallace Going, DO;  Location: [facility];  Service: Plastics;  Laterality: Left;  45 min, please    Family History  Problem Relation Age of Onset  . Cancer Mother        Esophageal Cancer  . Early death Mother        Age 51  . Cancer Father        Lung Cancer  . Diabetes Father   . Diabetes Brother        Controlled with diet  . Heart attack Paternal Grandfather   . Colon cancer Neg Hx     Social History:  reports that she quit smoking about 44 years ago. She has a 2.50 pack-year smoking history. She has never used smokeless tobacco. She reports current alcohol use of about 1.0 standard drink of alcohol per week. She reports that she does not use drugs. She lives in [place]. The patient is alone today.  Allergies:  Allergies  Allergen Reactions  . Peanut-Containing Drug Products Swelling and Other (See Comments)    Only when eating too many peanuts, swelling  with lips and tingly face    Current Medications: Current Outpatient Medications  Medication Sig Dispense Refill  . b complex vitamins tablet Take 1 tablet by mouth daily.    . Calcium-Magnesium-Vitamin D (CALCIUM 1200+D3 PO) Take 1,200 mg by mouth daily.     Marland Kitchen gabapentin (NEURONTIN) 300 MG capsule TAKE 1 CAPSULE BY MOUTH TWICE A DAY 60 capsule 1  . letrozole (FEMARA) 2.5 MG tablet Take 1 tablet (2.5 mg total) by mouth daily. 30 tablet 3  . loratadine (CLARITIN) 10 MG tablet Take 10 mg by mouth every morning.     . Melatonin 5 MG CAPS Take 5 mg by mouth daily as needed (sleep).     . Multiple Vitamin (MULTIVITAMIN WITH MINERALS) TABS tablet Take 1 tablet by mouth daily. Centrum Silver    . Naphazoline HCl (CLEAR EYES OP) Place 1 drop into both eyes daily.    . Probiotic Product (PROBIOTIC PO) Take 1 capsule by mouth daily.    Marland Kitchen  Investigational aspirin/placebo 300 MG tablet [facility] [id number] Take 1 tablet by mouth daily. Take with food or a full glass of water.  Do not crush enteric coated tablets. (Patient not taking: Reported on 10/19/2020) 180 tablet 0   No current facility-administered medications for this visit.   Facility-Administered Medications Ordered in Other Visits  Medication Dose Route Frequency Provider Last Rate Last Admin  . heparin lock flush 100 unit/mL  500 Units Intracatheter PRN Lequita Asal, MD        Review of Systems  Constitutional: Negative for chills, diaphoresis, fever, malaise/fatigue and weight loss (up 14 lbs).       Feels "good."  HENT: Positive for hearing loss. Negative for congestion, ear discharge, ear pain, nosebleeds, sinus pain, sore throat and tinnitus.   Eyes: Negative for blurred vision.  Respiratory: Negative for cough, hemoptysis, sputum production and shortness of breath.   Cardiovascular: Negative for chest pain, palpitations and leg swelling.  Gastrointestinal: Negative for abdominal pain, blood in stool, constipation, diarrhea,  heartburn, melena, nausea and vomiting.       Iron-rich diet.  Genitourinary: Negative for dysuria, frequency, hematuria and urgency.  Musculoskeletal: Negative for back pain, joint pain, myalgias and neck pain.       Lymphedema has resolved.  Skin: Negative for itching and rash.  Neurological: Positive for tingling (ends of fingers and toes, on gabapentin). Negative for dizziness, sensory change, weakness and headaches.  Endo/Heme/Allergies: Does not bruise/bleed easily.  Psychiatric/Behavioral: Negative for depression and memory loss. The patient is not nervous/anxious and does not have insomnia.   All other systems reviewed and are negative.  Performance status (ECOG): 1  Vitals Blood pressure 114/78, pulse 78, temperature 98.2 F (36.8 C), temperature source Tympanic, resp. rate 16, weight 153 lb 1.8 oz (69.4 kg), SpO2 99 %.   Physical Exam Vitals and nursing note reviewed.  Constitutional:      General: She is not in acute distress.    Appearance: She is not diaphoretic.  HENT:     Head: Normocephalic and atraumatic.     Comments: Shoulder length hair.    Mouth/Throat:     Mouth: Mucous membranes are moist.     Pharynx: Oropharynx is clear.  Eyes:     General: No scleral icterus.    Extraocular Movements: Extraocular movements intact.     Conjunctiva/sclera: Conjunctivae normal.     Pupils: Pupils are equal, round, and reactive to light.  Cardiovascular:     Rate and Rhythm: Normal rate and regular rhythm.     Heart sounds: Normal heart sounds. No murmur heard.   Pulmonary:     Effort: Pulmonary effort is normal. No respiratory distress.     Breath sounds: Normal breath sounds. No wheezing or rales.  Chest:     Chest wall: No tenderness.  Breasts:     Right: No swelling, mass, skin change, tenderness, axillary adenopathy or supraclavicular adenopathy.     Left: No swelling, mass, skin change, tenderness, axillary adenopathy or supraclavicular adenopathy.     Abdominal:     General: Bowel sounds are normal. There is no distension.     Palpations: Abdomen is soft. There is no mass.     Tenderness: There is no abdominal tenderness. There is no guarding or rebound.  Musculoskeletal:        General: No swelling or tenderness. Normal range of motion.     Cervical back: Normal range of motion and neck supple.  Lymphadenopathy:  Head:     Right side of head: No preauricular, posterior auricular or occipital adenopathy.     Left side of head: No preauricular, posterior auricular or occipital adenopathy.     Cervical: No cervical adenopathy.     Upper Body:     Right upper body: No supraclavicular or axillary adenopathy.     Left upper body: No supraclavicular or axillary adenopathy.     Lower Body: No right inguinal adenopathy. No left inguinal adenopathy.  Skin:    General: Skin is warm and dry.  Neurological:     Mental Status: She is alert and oriented to person, place, and time.  Psychiatric:        Behavior: Behavior normal.        Thought Content: Thought content normal.        Judgment: Judgment normal.    Appointment on 12/27/2020  Component Date Value Ref Range Status  . Magnesium 12/27/2020 2.1  1.7 - 2.4 mg/dL Final   Performed at [facility], [address], [place], [place] [address]  . Sodium 12/27/2020 138  135 - 145 mmol/L Final  . Potassium 12/27/2020 4.0  3.5 - 5.1 mmol/L Final  . Chloride 12/27/2020 99  98 - 111 mmol/L Final  . CO2 12/27/2020 29  22 - 32 mmol/L Final  . Glucose, Bld 12/27/2020 109* 70 - 99 mg/dL Final   Glucose reference range applies only to samples taken after fasting for at least 8 hours.  . BUN 12/27/2020 14  8 - 23 mg/dL Final  . Creatinine, Ser 12/27/2020 0.84  0.44 - 1.00 mg/dL Final  . Calcium 12/27/2020 9.8  8.9 - 10.3 mg/dL Final  . Total Protein 12/27/2020 7.7  6.5 - 8.1 g/dL Final  . Albumin 12/27/2020 4.5  3.5 - 5.0 g/dL Final  . AST 12/27/2020 25  15 - 41 U/L Final  . ALT  12/27/2020 18  0 - 44 U/L Final  . Alkaline Phosphatase 12/27/2020 56  38 - 126 U/L Final  . Total Bilirubin 12/27/2020 1.1  0.3 - 1.2 mg/dL Final  . GFR, Estimated 12/27/2020 >60  >60 mL/min Final   Comment: (NOTE) Calculated using the CKD-EPI Creatinine Equation (2021)   . Anion gap 12/27/2020 10  5 - 15 Final   Performed at [facility], [address] [place], [place] [address]  . WBC 12/27/2020 6.0  4.0 - 10.5 K/uL Final  . RBC 12/27/2020 5.03  3.87 - 5.11 MIL/uL Final  . Hemoglobin 12/27/2020 14.9  12.0 - 15.0 g/dL Final  . HCT 12/27/2020 42.9  36.0 - 46.0 % Final  . MCV 12/27/2020 85.3  80.0 - 100.0 fL Final  . MCH 12/27/2020 29.6  26.0 - 34.0 pg Final  . MCHC 12/27/2020 34.7  30.0 - 36.0 g/dL Final  . RDW 12/27/2020 13.0  11.5 - 15.5 % Final  . Platelets 12/27/2020 234  150 - 400 K/uL Final  . nRBC 12/27/2020 0.0  0.0 - 0.2 % Final  . Neutrophils Relative % 12/27/2020 65  % Final  . Neutro Abs 12/27/2020 3.8  1.7 - 7.7 K/uL Final  . Lymphocytes Relative 12/27/2020 26  % Final  . Lymphs Abs 12/27/2020 1.6  0.7 - 4.0 K/uL Final  . Monocytes Relative 12/27/2020 7  % Final  . Monocytes Absolute 12/27/2020 0.4  0.1 - 1.0 K/uL Final  . Eosinophils Relative 12/27/2020 1  % Final  . Eosinophils Absolute 12/27/2020 0.1  0.0 - 0.5  K/uL Final  . Basophils Relative 12/27/2020 1  % Final  . Basophils Absolute 12/27/2020 0.1  0.0 - 0.1 K/uL Final  . Immature Granulocytes 12/27/2020 0  % Final  . Abs Immature Granulocytes 12/27/2020 0.02  0.00 - 0.07 K/uL Final   Performed at [facility], [address] [place], [place] [address]    Assessment:  Megan Vega is a 65 y.o. female with clinical stage T2N3bM0 right breast cancerand DCIS in the left breasts/p neoadjuvant chemotherapyfollowed by left simple mastectomy and right modified radical mastectomyon 07/10/2018. Left breast pathologyrevealed 2.8 cm residual high grade DCIS, comedo type. There was atypical  lobular hyperplasia. Four sentinel lymph nodes were negative. Right breast pathologyrevealed 3.6 cm residual grade II invasive mammary carcinoma and high grade DCIS. There was lymphovascular invasion.There was metastatic carcinoma in 2 sentinel lymph nodes. Right axillary dissection revealed metastatic carcinoma in 7 lymph nodes. There were a total of 9 lymph nodes with macrometastasis (largest 22 mm). Pathologic stagewas ypT2 ypN2a.  She underwent ultrasound guided biopsyon 10/29/2017. Pathology from the right breastat the 10 o'clock position 3 cm from the nipple revealed a 1.4 cm grade II invasive mammary carcinoma of no special type. Tumor was ER + (>90%), PR + (90%) and Her2/neu 1+. Right axillary node biopsyrevealed metastatic carcinoma. Left breast biopsyat the 2 o'clock position 3 cm from the nipple revealed high grade DCIS with comedonecrosis.   She received 4 cycles of neoadjuvantAC (12/05/2017 - 01/30/2018) with OnPro Neulasta support. She received 12 weeks ofneoadjuvant Taxol(02/13/2018 - 02/27/2018; 04/10/2018 - 06/05/2018). She received radiationfrom 09/23/2018 - 11/23/2018.She began letrozoleon02/19/2020.  Patient underwent bilateral breast reconstruction with placement of Acellular Dermal Matrix and tissue expanders on 08/30/2019.  She underwent right breast reconstruction with latissimus myocutaneous flap and removal of her port-a-cath on 02/07/2020.   CA27.29has been followed: 23.5 on 11/14/2017,<3.5 on 08/27/2018, 7.4 on 01/06/2019, 9.2 on 05/24/2019, 11.9 on 08/23/2019, 5.8 on 06/26/2020, and 6.6 on 12/27/2020.  Bone densityon 12/16/2016 revealed osteopeniawith a T-score of -1.6 in the LEFTfemoral neck on 12/16/2017. Bone densityon 11/24/2018 revealed osteopenia with a T-score of -2.1 in the LEFTfemoral neck.  She receives Zometa every 6 months (08/23/2019 - 08/21/2020).  She developed a right axillary abscessand underwent incision and  drainage on 03/12/2018. Culture grew staph aureus. She completed Bactrim on 03/26/2018.  She underwent right breast reconstruction with latissimus myocutaneous flap and removal of her port-a-cath on 02/07/2020.  Expanders were taken out in 05/2020 secondary to infection.  She underwent excision of left breast wound with closure on 10/02/2020. Pathology revealed inflamed granulation tissue and fibrosis. There was no evidence of malignancy.    She received the COVID-19 vaccine in 04/2020 and 05/2020.  Symptomatically, she feels "good."  Lymphedema has resolved. She has a slight neuropathy in the ends of her fingers.  Exam is unremarkable.   Plan: 1.   Labs today: CBC with diff, CMP, Mg, ferritin, CA27.29. 2.Stage IIIBRIGHTbreast cancer Clinically, she is doing well.             She is s/pbilateral mastectomy.              She is s/p right breast reconstruction on 02/07/2020.   Expanders were removed secondary to infection.  Exam reveals no evidence of recurrent disease.  CA 27.29 is 6.6 today.    Continue Femara. 3.Chemotherapy induced neuropathy She has a slight neuropathy at the ends of her fingertips and toes. She remains on Neurontin 300 mg BID. 4.Osteopenia Bone density on01/07/2020revealed  a T-score of -2.1. Patient on calcium and vitamin D. Patient receives Zometa twice annually (last 08/21/2020).  Next due 02/2021. Continue bone density every 2 years. 5. Normocytic anemia, resolved  Hematocrit 42.9.  Hemoglobin 14.9.  MCV 85.3 today.   Ferritin 29. 6.   Schedule bone density. 7.   RTC on 02/19/2021 for labs (BMP) and Zometa. 8.   RTC in 6 months for MD assessment, labs (CBC with diff, CMP, Mg, CA27.29), and review of bone density.  I discussed the assessment and treatment plan with the patient.  The patient was provided an opportunity to ask questions and all were answered.  The patient agreed with the plan and demonstrated an understanding of  the instructions.  The patient was advised to call back if the symptoms worsen or if the condition fails to improve as anticipated.   Lequita Asal, MD, PhD    12/27/2020, 2:07 PM  I, Mirian Mo Tufford, am acting as Education administrator for [employer]. Mike Gip, MD, PhD.  I, Macrae Wiegman C. Mike Gip, MD, have reviewed the above documentation for accuracy and completeness, and I agree with the above.

## 2020-12-27 ENCOUNTER — Inpatient Hospital Stay: Payer: BC Managed Care – PPO

## 2020-12-27 ENCOUNTER — Other Ambulatory Visit: Payer: Self-pay | Admitting: Hematology and Oncology

## 2020-12-27 ENCOUNTER — Inpatient Hospital Stay: Payer: BC Managed Care – PPO | Attending: Hematology and Oncology | Admitting: Hematology and Oncology

## 2020-12-27 ENCOUNTER — Other Ambulatory Visit: Payer: 59

## 2020-12-27 ENCOUNTER — Other Ambulatory Visit: Payer: Self-pay

## 2020-12-27 ENCOUNTER — Encounter: Payer: Self-pay | Admitting: Hematology and Oncology

## 2020-12-27 VITALS — BP 114/78 | HR 78 | Temp 98.2°F | Resp 16 | Wt 153.1 lb

## 2020-12-27 DIAGNOSIS — T451X5A Adverse effect of antineoplastic and immunosuppressive drugs, initial encounter: Secondary | ICD-10-CM

## 2020-12-27 DIAGNOSIS — Z170 Estrogen receptor positive status [ER+]: Secondary | ICD-10-CM | POA: Diagnosis not present

## 2020-12-27 DIAGNOSIS — Z79811 Long term (current) use of aromatase inhibitors: Secondary | ICD-10-CM | POA: Diagnosis not present

## 2020-12-27 DIAGNOSIS — M8580 Other specified disorders of bone density and structure, unspecified site: Secondary | ICD-10-CM | POA: Diagnosis not present

## 2020-12-27 DIAGNOSIS — D509 Iron deficiency anemia, unspecified: Secondary | ICD-10-CM

## 2020-12-27 DIAGNOSIS — Z006 Encounter for examination for normal comparison and control in clinical research program: Secondary | ICD-10-CM | POA: Insufficient documentation

## 2020-12-27 DIAGNOSIS — G620 Drug-induced polyneuropathy: Secondary | ICD-10-CM

## 2020-12-27 DIAGNOSIS — Z9013 Acquired absence of bilateral breasts and nipples: Secondary | ICD-10-CM | POA: Insufficient documentation

## 2020-12-27 DIAGNOSIS — R790 Abnormal level of blood mineral: Secondary | ICD-10-CM

## 2020-12-27 DIAGNOSIS — C50411 Malignant neoplasm of upper-outer quadrant of right female breast: Secondary | ICD-10-CM | POA: Diagnosis present

## 2020-12-27 DIAGNOSIS — M85852 Other specified disorders of bone density and structure, left thigh: Secondary | ICD-10-CM

## 2020-12-27 LAB — COMPREHENSIVE METABOLIC PANEL
ALT: 18 U/L (ref 0–44)
AST: 25 U/L (ref 15–41)
Albumin: 4.5 g/dL (ref 3.5–5.0)
Alkaline Phosphatase: 56 U/L (ref 38–126)
Anion gap: 10 (ref 5–15)
BUN: 14 mg/dL (ref 8–23)
CO2: 29 mmol/L (ref 22–32)
Calcium: 9.8 mg/dL (ref 8.9–10.3)
Chloride: 99 mmol/L (ref 98–111)
Creatinine, Ser: 0.84 mg/dL (ref 0.44–1.00)
GFR, Estimated: >60 mL/min (ref >60)
Glucose, Bld: 109 mg/dL — ABNORMAL HIGH (ref 70–99)
Potassium: 4 mmol/L (ref 3.5–5.1)
Sodium: 138 mmol/L (ref 135–145)
Total Bilirubin: 1.1 mg/dL (ref 0.3–1.2)
Total Protein: 7.7 g/dL (ref 6.5–8.1)

## 2020-12-27 LAB — CBC WITH DIFFERENTIAL/PLATELET
Abs Immature Granulocytes: 0.02 10*3/uL (ref 0.00–0.07)
Basophils Absolute: 0.1 10*3/uL (ref 0.0–0.1)
Basophils Relative: 1 %
Eosinophils Absolute: 0.1 10*3/uL (ref 0.0–0.5)
Eosinophils Relative: 1 %
HCT: 42.9 % (ref 36.0–46.0)
Hemoglobin: 14.9 g/dL (ref 12.0–15.0)
Immature Granulocytes: 0 %
Lymphocytes Relative: 26 %
Lymphs Abs: 1.6 10*3/uL (ref 0.7–4.0)
MCH: 29.6 pg (ref 26.0–34.0)
MCHC: 34.7 g/dL (ref 30.0–36.0)
MCV: 85.3 fL (ref 80.0–100.0)
Monocytes Absolute: 0.4 10*3/uL (ref 0.1–1.0)
Monocytes Relative: 7 %
Neutro Abs: 3.8 10*3/uL (ref 1.7–7.7)
Neutrophils Relative %: 65 %
Platelets: 234 10*3/uL (ref 150–400)
RBC: 5.03 MIL/uL (ref 3.87–5.11)
RDW: 13 % (ref 11.5–15.5)
WBC: 6 10*3/uL (ref 4.0–10.5)
nRBC: 0 % (ref 0.0–0.2)

## 2020-12-27 LAB — MAGNESIUM: Magnesium: 2.1 mg/dL (ref 1.7–2.4)

## 2020-12-27 LAB — FERRITIN: Ferritin: 29 ng/mL (ref 11–307)

## 2020-12-27 NOTE — Progress Notes (Signed)
Patient stopped taking her Investigational aspirin / placebo on 10/20/2020 per study closure instructions. She was receiving aspirin per the study for investigational product.  Jeral Fruit, RN 12/27/20 3:24 PM

## 2020-12-27 NOTE — Research (Addendum)
Protocol: [id number] [name]  Cycle 24 Month:  Labs Research labs collected by phlebotomy 2-10 ml EDTA tubes with blood, and 2- 15 ml Falcon tubes of urine. Research labs were shipped on cold packs to the [facility] per protocol requirements for her 24 month assessment.   Questionnaires 24 Month Questionnaire booklet was completed without any difficulty by the patient.  Adverse Events The patient denies any adverse events associated with her study drug Aspirin / placebo, all solicited AE 's were reviewed with the patient and answered no.  Plan: The patient is aware that she will be scheduled for a 6 month follow up for completion of the study and study drug. The patient verified that she stopped taking her study drug after her dose on 10/19/2020. The patient returned her medication diaries for September - December with study drug ending on 10/19/2020. One bottle of study drug / placebo tablets were received from the patient and returned to the pharmacy with #125  300 mg tablets remaining in the bottle. Debbora Lacrosse, PharmD logged the return on the appropriate patient DARF, and then logged destruction per protocol requirements. The patient was provided the information letter from [facility] concerning the reason for closure of the study, all questions were answered to her reported satisfaction. The patient was provided with the unblinding information from [facility] and that she did receive the IP aspirin during the study. Research thanked the patient for her participation in the study and her willingness to be flexible with her appointments today. Approximately one hour was spent with the patient in completion of research activities today. The patient was encouraged to call the research office if she has any further questions or concerns prior to her next visit.  Jeral Fruit, RN 12/27/20 3:10 PM

## 2020-12-28 LAB — CA 27.29 (SERIAL MONITOR): CA 27.29: 6.6 U/mL (ref 0.0–38.6)

## 2021-01-05 ENCOUNTER — Telehealth: Payer: Self-pay | Admitting: Hematology and Oncology

## 2021-01-05 NOTE — Telephone Encounter (Addendum)
01/05/2021 Spoke w/ pt and informed her that her annual bone density has been scheduled for 01/22/21 @ 1:40. Pt confirmed appt. Left VM afterwards informing pt that per Jeral Fruit, RN, 6 month f/u appts needed to be moved to 04/2021 due to the study pt is part of. appts moved to 05/09/21 @ 1 pm. Informed pt to call back or send mychart message if she had any questions or needed to r/s SRW

## 2021-01-09 ENCOUNTER — Telehealth (INDEPENDENT_AMBULATORY_CARE_PROVIDER_SITE_OTHER): Payer: BC Managed Care – PPO | Admitting: Plastic Surgery

## 2021-01-09 ENCOUNTER — Other Ambulatory Visit: Payer: Self-pay

## 2021-01-09 ENCOUNTER — Encounter: Payer: Self-pay | Admitting: Plastic Surgery

## 2021-01-09 DIAGNOSIS — Z9013 Acquired absence of bilateral breasts and nipples: Secondary | ICD-10-CM

## 2021-01-09 NOTE — Progress Notes (Signed)
The patient is a lovely 65 year old lady joining me by phone.  She had bilateral mastectomies with some complications.  She had expanders and then had them removed.  She is now doing really well.  No complaints.  She has some prost[facility].  I had like to see her back in 6 months.  The patient is in agreement.  The patient gave consent to have this visit done by telemedicine / virtual visit.  This is also consent for access the chart and treat the patient via this visit. The patient is located at home.  I, the provider, am at the office.  We spent 5 minutes together for the visit.

## 2021-01-10 ENCOUNTER — Telehealth: Payer: Self-pay | Admitting: Family

## 2021-01-10 NOTE — Telephone Encounter (Signed)
Rejection Reason - Patient Declined - CALLED TO [facility] THIS APPT" Janice Norrie said on Jan 10, 2021 11:34 AM  "pT CALLED ON 02.21.22 TO [facility] THIS APPT" Janice Norrie said on Jan 10, 2021 11:33 AM  [place] [facility]

## 2021-01-15 ENCOUNTER — Ambulatory Visit: Payer: 59 | Admitting: Dermatology

## 2021-01-22 ENCOUNTER — Other Ambulatory Visit: Payer: Self-pay

## 2021-01-22 ENCOUNTER — Ambulatory Visit
Admission: RE | Admit: 2021-01-22 | Discharge: 2021-01-22 | Disposition: A | Discharge status: Home / Self Care | Payer: BC Managed Care – PPO | Source: Ambulatory Visit | Attending: Hematology and Oncology | Admitting: Hematology and Oncology

## 2021-01-22 DIAGNOSIS — M85852 Other specified disorders of bone density and structure, left thigh: Secondary | ICD-10-CM | POA: Diagnosis not present

## 2021-01-27 ENCOUNTER — Other Ambulatory Visit: Payer: Self-pay | Admitting: Hematology and Oncology

## 2021-01-27 DIAGNOSIS — C50411 Malignant neoplasm of upper-outer quadrant of right female breast: Secondary | ICD-10-CM

## 2021-02-19 ENCOUNTER — Other Ambulatory Visit: Payer: Self-pay

## 2021-02-19 ENCOUNTER — Inpatient Hospital Stay: Payer: BC Managed Care – PPO | Attending: Hematology and Oncology

## 2021-02-19 ENCOUNTER — Inpatient Hospital Stay: Payer: BC Managed Care – PPO

## 2021-02-19 VITALS — BP 129/75 | HR 62 | Temp 96.7°F | Resp 18

## 2021-02-19 DIAGNOSIS — C50411 Malignant neoplasm of upper-outer quadrant of right female breast: Secondary | ICD-10-CM | POA: Diagnosis not present

## 2021-02-19 DIAGNOSIS — M8580 Other specified disorders of bone density and structure, unspecified site: Secondary | ICD-10-CM | POA: Insufficient documentation

## 2021-02-19 DIAGNOSIS — Z79899 Other long term (current) drug therapy: Secondary | ICD-10-CM | POA: Insufficient documentation

## 2021-02-19 DIAGNOSIS — G620 Drug-induced polyneuropathy: Secondary | ICD-10-CM | POA: Insufficient documentation

## 2021-02-19 DIAGNOSIS — T451X5A Adverse effect of antineoplastic and immunosuppressive drugs, initial encounter: Secondary | ICD-10-CM

## 2021-02-19 DIAGNOSIS — Z170 Estrogen receptor positive status [ER+]: Secondary | ICD-10-CM | POA: Insufficient documentation

## 2021-02-19 DIAGNOSIS — D701 Agranulocytosis secondary to cancer chemotherapy: Secondary | ICD-10-CM

## 2021-02-19 LAB — BASIC METABOLIC PANEL
Anion gap: 7 (ref 5–15)
BUN: 11 mg/dL (ref 8–23)
CO2: 26 mmol/L (ref 22–32)
Calcium: 9.5 mg/dL (ref 8.9–10.3)
Chloride: 104 mmol/L (ref 98–111)
Creatinine, Ser: 0.99 mg/dL (ref 0.44–1.00)
GFR, Estimated: >60 mL/min (ref >60)
Glucose, Bld: 105 mg/dL — ABNORMAL HIGH (ref 70–99)
Potassium: 3.8 mmol/L (ref 3.5–5.1)
Sodium: 137 mmol/L (ref 135–145)

## 2021-02-19 MED ORDER — SODIUM CHLORIDE 0.9 % IV SOLN
Freq: Once | INTRAVENOUS | Status: AC
  Filled 2021-02-19: qty 250

## 2021-02-19 MED ORDER — ZOLEDRONIC ACID 4 MG/100ML IV SOLN
4.0000 mg | Freq: Once | INTRAVENOUS | Status: AC
  Administered 2021-02-19: 4 mg via INTRAVENOUS
  Filled 2021-02-19: qty 100

## 2021-05-09 ENCOUNTER — Ambulatory Visit: Payer: BC Managed Care – PPO | Admitting: Oncology

## 2021-05-09 ENCOUNTER — Other Ambulatory Visit: Payer: BC Managed Care – PPO

## 2021-05-11 ENCOUNTER — Ambulatory Visit: Payer: BC Managed Care – PPO | Admitting: Oncology

## 2021-05-11 ENCOUNTER — Other Ambulatory Visit: Payer: BC Managed Care – PPO

## 2021-05-14 ENCOUNTER — Encounter: Payer: Self-pay | Admitting: Hematology and Oncology

## 2021-05-14 ENCOUNTER — Encounter: Payer: Self-pay | Admitting: Urgent Care

## 2021-05-20 ENCOUNTER — Encounter: Payer: Self-pay | Admitting: Hematology and Oncology

## 2021-05-20 ENCOUNTER — Encounter: Payer: Self-pay | Admitting: Urgent Care

## 2021-05-22 ENCOUNTER — Ambulatory Visit
Admission: RE | Admit: 2021-05-22 | Discharge: 2021-05-22 | Disposition: A | Discharge status: Home / Self Care | Payer: Medicare PPO | Source: Ambulatory Visit | Attending: Oncology | Admitting: Oncology

## 2021-05-22 ENCOUNTER — Other Ambulatory Visit: Payer: Self-pay

## 2021-05-22 ENCOUNTER — Inpatient Hospital Stay: Payer: Medicare PPO | Attending: Oncology

## 2021-05-22 ENCOUNTER — Inpatient Hospital Stay (HOSPITAL_BASED_OUTPATIENT_CLINIC_OR_DEPARTMENT_OTHER): Payer: Medicare PPO | Admitting: Oncology

## 2021-05-22 VITALS — BP 108/76 | HR 73 | Temp 96.9°F | Resp 18 | Wt 150.0 lb

## 2021-05-22 DIAGNOSIS — Z79811 Long term (current) use of aromatase inhibitors: Secondary | ICD-10-CM

## 2021-05-22 DIAGNOSIS — Z9013 Acquired absence of bilateral breasts and nipples: Secondary | ICD-10-CM | POA: Diagnosis not present

## 2021-05-22 DIAGNOSIS — Z006 Encounter for examination for normal comparison and control in clinical research program: Secondary | ICD-10-CM | POA: Insufficient documentation

## 2021-05-22 DIAGNOSIS — T451X5A Adverse effect of antineoplastic and immunosuppressive drugs, initial encounter: Secondary | ICD-10-CM

## 2021-05-22 DIAGNOSIS — C50411 Malignant neoplasm of upper-outer quadrant of right female breast: Secondary | ICD-10-CM

## 2021-05-22 DIAGNOSIS — D509 Iron deficiency anemia, unspecified: Secondary | ICD-10-CM

## 2021-05-22 DIAGNOSIS — Z853 Personal history of malignant neoplasm of breast: Secondary | ICD-10-CM

## 2021-05-22 DIAGNOSIS — Z170 Estrogen receptor positive status [ER+]: Secondary | ICD-10-CM | POA: Insufficient documentation

## 2021-05-22 DIAGNOSIS — Z08 Encounter for follow-up examination after completed treatment for malignant neoplasm: Secondary | ICD-10-CM | POA: Diagnosis not present

## 2021-05-22 DIAGNOSIS — Z87891 Personal history of nicotine dependence: Secondary | ICD-10-CM | POA: Diagnosis not present

## 2021-05-22 DIAGNOSIS — M545 Low back pain, unspecified: Secondary | ICD-10-CM

## 2021-05-22 DIAGNOSIS — D701 Agranulocytosis secondary to cancer chemotherapy: Secondary | ICD-10-CM

## 2021-05-22 DIAGNOSIS — M8580 Other specified disorders of bone density and structure, unspecified site: Secondary | ICD-10-CM | POA: Diagnosis not present

## 2021-05-22 DIAGNOSIS — C50911 Malignant neoplasm of unspecified site of right female breast: Secondary | ICD-10-CM | POA: Insufficient documentation

## 2021-05-22 DIAGNOSIS — Z7983 Long term (current) use of bisphosphonates: Secondary | ICD-10-CM

## 2021-05-22 LAB — COMPREHENSIVE METABOLIC PANEL
ALT: 17 U/L (ref 0–44)
AST: 26 U/L (ref 15–41)
Albumin: 4.4 g/dL (ref 3.5–5.0)
Alkaline Phosphatase: 49 U/L (ref 38–126)
Anion gap: 10 (ref 5–15)
BUN: 14 mg/dL (ref 8–23)
CO2: 28 mmol/L (ref 22–32)
Calcium: 9.8 mg/dL (ref 8.9–10.3)
Chloride: 99 mmol/L (ref 98–111)
Creatinine, Ser: 1.1 mg/dL — ABNORMAL HIGH (ref 0.44–1.00)
GFR, Estimated: 56 mL/min — ABNORMAL LOW (ref >60)
Glucose, Bld: 100 mg/dL — ABNORMAL HIGH (ref 70–99)
Potassium: 4.3 mmol/L (ref 3.5–5.1)
Sodium: 137 mmol/L (ref 135–145)
Total Bilirubin: 1.1 mg/dL (ref 0.3–1.2)
Total Protein: 7.8 g/dL (ref 6.5–8.1)

## 2021-05-22 LAB — CBC WITH DIFFERENTIAL/PLATELET
Abs Immature Granulocytes: 0.01 10*3/uL (ref 0.00–0.07)
Basophils Absolute: 0.1 10*3/uL (ref 0.0–0.1)
Basophils Relative: 1 %
Eosinophils Absolute: 0.1 10*3/uL (ref 0.0–0.5)
Eosinophils Relative: 2 %
HCT: 44 % (ref 36.0–46.0)
Hemoglobin: 14.9 g/dL (ref 12.0–15.0)
Immature Granulocytes: 0 %
Lymphocytes Relative: 28 %
Lymphs Abs: 1.2 10*3/uL (ref 0.7–4.0)
MCH: 30.2 pg (ref 26.0–34.0)
MCHC: 33.9 g/dL (ref 30.0–36.0)
MCV: 89.2 fL (ref 80.0–100.0)
Monocytes Absolute: 0.4 10*3/uL (ref 0.1–1.0)
Monocytes Relative: 9 %
Neutro Abs: 2.6 10*3/uL (ref 1.7–7.7)
Neutrophils Relative %: 60 %
Platelets: 229 10*3/uL (ref 150–400)
RBC: 4.93 MIL/uL (ref 3.87–5.11)
RDW: 12.2 % (ref 11.5–15.5)
WBC: 4.4 10*3/uL (ref 4.0–10.5)
nRBC: 0 % (ref 0.0–0.2)

## 2021-05-22 LAB — MAGNESIUM: Magnesium: 2 mg/dL (ref 1.7–2.4)

## 2021-05-22 NOTE — Research (Signed)
[id number] " [name]" 6 month EOT Visit  Patient in to the cancer center for her scheduled visit to see Dr. Janese Banks today for the first time. Solicited AE's reviewed with the patient for this visit since stopping her IP medication in December. Patient denies any bruising, nose bleeds, blood in stool or urine or other bleeding. States she has been fine since she finished her study drug. The patient was encouraged to call research department if she has any questions in the future regarding the research protocol. Approximately 20 minutes was spent with the patient today in completion of research protocol activities.    Study: [id number] [name] Cycle: 6 month EOT Event Grade Onset Date Resolved Date Drug Name Attribution Treatment Comments  GI Bleeding 0        Intracranial hemorrhage 0        Epistaxis 0        Hematuria 0        Dyspepsia 0      Only occasionally after eating spicy foods  Gastritis 0        Bruising 0        Chronic tremors 1 baseline   unrelated    Peripheral sensory neuropathy 1 baseline   unrelated B-complex vitamins Residual from chemotherapy  Occasional migraines 1 baseline   unrelated prn Excedrin migraine   Lymphedema in Rt upper arm 1 05/24/2019   mastectomy Physical therapy "mild" per MD  Weight loss  1 06/26/2020   unrelated  No appetite due to complications from tissue expanders  Jeral Fruit, RN 05/22/2021 3:11 PM

## 2021-05-22 NOTE — Progress Notes (Signed)
Patient report low back pain, dull and achy , 3/10

## 2021-05-23 ENCOUNTER — Other Ambulatory Visit: Payer: Self-pay

## 2021-05-23 DIAGNOSIS — C50411 Malignant neoplasm of upper-outer quadrant of right female breast: Secondary | ICD-10-CM

## 2021-05-23 LAB — CANCER ANTIGEN 27.29: CA 27.29: 9.7 U/mL (ref 0.0–38.6)

## 2021-05-31 ENCOUNTER — Encounter: Payer: Self-pay | Admitting: Oncology

## 2021-05-31 ENCOUNTER — Telehealth: Payer: Self-pay | Admitting: Oncology

## 2021-05-31 ENCOUNTER — Encounter: Payer: Self-pay | Admitting: Urgent Care

## 2021-05-31 ENCOUNTER — Other Ambulatory Visit: Payer: Self-pay | Admitting: *Deleted

## 2021-05-31 ENCOUNTER — Encounter: Payer: Self-pay | Admitting: Hematology and Oncology

## 2021-05-31 DIAGNOSIS — C50411 Malignant neoplasm of upper-outer quadrant of right female breast: Secondary | ICD-10-CM

## 2021-05-31 DIAGNOSIS — M545 Low back pain, unspecified: Secondary | ICD-10-CM

## 2021-05-31 NOTE — Telephone Encounter (Signed)
Left VM with patient to notify her of infusion and labs scheduled for October. Also gave her information about going to get the X-ray done this week if possible. Order is on. Requested she return call back to confirm or to ask any questions she might have.

## 2021-05-31 NOTE — Progress Notes (Signed)
Hematology/Oncology Consult note [facility]  [phone] [phone]  Patient Care Team: Burnard Hawthorne, FNP as PCP - General (Family Medicine) Rico Junker, RN as Oncology Nurse Navigator Byrnett, Forest Gleason, MD (General Surgery) Leone Haven, MD as Consulting Physician (Family Medicine) Vladimir Crofts, MD as Consulting Physician (Neurology) Sindy Guadeloupe, MD as Consulting Physician (Oncology)   Name of the patient: Megan Vega  [id number]  05-Mar-1956   Date of visit: 05/31/21  Diagnosis-stage III right breast cancer and left breast DCIS in 2019  Chief complaint/ Reason for visit-routine follow-up of DCIS and breast cancer  Heme/Onc history: Megan Vega is a 65 y.o. female with clinical stage T2N3bM0 right breast cancer and DCIS in the left breast s/p neoadjuvant chemotherapy followed by left simple mastectomy and right modified radical mastectomy on 07/10/2018.  Biopsy showed 1.4 cm grade 2 invasive mammary carcinoma ER greater than 90% positive PR greater than 90% positive and HER2 negative +1  Patient received neoadjuvant AC Taxol chemotherapy until July 2019.  Left breast pathology revealed 2.8 cm residual high grade DCIS, comedo type.  There was atypical lobular hyperplasia.  Four sentinel lymph nodes were negative.  Right breast pathology revealed 3.6 cm residual grade II invasive mammary carcinoma and high grade DCIS.  There was lymphovascular invasion.There was metastatic carcinoma in 2 sentinel lymph nodes.  Right axillary dissection revealed metastatic carcinoma in 7 lymph nodes.  There were a total of 9 lymph nodes with macrometastasis (largest 22 mm).  Pathologic stage was ypT2 ypN2a.  Patient then received adjuvant radiation treatment and started letrozole in February 2020  She underwent right breast reconstruction with latissimus myocutaneous flap and removal of her port-a-cath on 02/07/2020.  Expanders  were taken out in 05/2020 secondary to infection.  She underwent excision of left breast wound with closure on 10/02/2020. Pathology revealed inflamed granulation tissue and fibrosis. There was no evidence of malignancy.      Patient has baseline osteopenia and receives Zometa every 6 months    Interval history-patient currently reports low back pain which has been ongoing for the last 1 week or so and interferes with her activities and sleep  ECOG PS- 1 Pain scale- 4   Review of systems- Review of Systems  Constitutional:  Negative for chills, fever, malaise/fatigue and weight loss.  HENT:  Negative for congestion, ear discharge and nosebleeds.   Eyes:  Negative for blurred vision.  Respiratory:  Negative for cough, hemoptysis, sputum production, shortness of breath and wheezing.   Cardiovascular:  Negative for chest pain, palpitations, orthopnea and claudication.  Gastrointestinal:  Negative for abdominal pain, blood in stool, constipation, diarrhea, heartburn, melena, nausea and vomiting.  Genitourinary:  Negative for dysuria, flank pain, frequency, hematuria and urgency.  Musculoskeletal:  Positive for back pain. Negative for joint pain and myalgias.  Skin:  Negative for rash.  Neurological:  Negative for dizziness, tingling, focal weakness, seizures, weakness and headaches.  Endo/Heme/Allergies:  Does not bruise/bleed easily.  Psychiatric/Behavioral:  Negative for depression and suicidal ideas. The patient does not have insomnia.      Allergies  Allergen Reactions   Peanut-Containing Drug Products Swelling and Other (See Comments)    Only when eating too many peanuts, swelling with lips and tingly face     Past Medical History:  Diagnosis Date   Ankle fracture    Breast cancer ([place])    bilateral   Cancer ([place])    Basal Cell Carcinoma, about  2011 chest   Cataract    Family history of adverse reaction to anesthesia    daughter with PONV   Hemorrhoids    History of  methicillin resistant staphylococcus aureus (MRSA) 2015   Migraines    MIGRAINES occ   Personal history of chemotherapy    Personal history of radiation therapy    Wears glasses      Past Surgical History:  Procedure Laterality Date   BREAST BIOPSY Bilateral 10/29/2017   L breast DCIS, R breast invasive mammary carcinoma of no special type, ER/PR+, Her2/neu 1+   BREAST RECONSTRUCTION Right 02/07/2020   Procedure: BREAST RECONSTRUCTION;  Surgeon: Wallace Going, DO;  Location: [place];  Service: Plastics;  Laterality: Right;   BREAST RECONSTRUCTION WITH PLACEMENT OF TISSUE EXPANDER AND FLEX HD (ACELLULAR HYDRATED DERMIS) Bilateral 08/30/2019   Procedure: BREAST RECONSTRUCTION WITH PLACEMENT OF TISSUE EXPANDER AND FLEX HD (ACELLULAR HYDRATED DERMIS);  Surgeon: Wallace Going, DO;  Location: [facility];  Service: Plastics;  Laterality: Bilateral;  2.5 hours, please   CESAREAN SECTION     COLONOSCOPY  2012   COLONOSCOPY WITH PROPOFOL N/A 12/01/2018   Procedure: COLONOSCOPY WITH PROPOFOL;  Surgeon: Lucilla Lame, MD;  [facility];  Service: Endoscopy;  Laterality: N/A;   DEBRIDEMENT AND CLOSURE WOUND Bilateral 10/02/2020   Procedure: Excision of bilateral breast wound;  Surgeon: Wallace Going, DO;  Location: [place];  Service: Plastics;  Laterality: Bilateral;  1.5 hours, please   FRACTURE SURGERY     ankle   IRRIGATION AND DEBRIDEMENT ABSCESS Right 03/12/2018   Procedure: IRRIGATION AND DEBRIDEMENT RIGHT AXILLARY ABSCESS;  Surgeon: Robert Bellow, MD;  Location: [facility];  Service: General;  Laterality: Right;   LATISSIMUS FLAP TO BREAST Right 02/07/2020   Procedure: LATISSIMUS MYOCUTANEOUS FLAP TO BREAST;  Surgeon: Wallace Going, DO;  Location: [place];  Service: Plastics;  Laterality: Right;  3 hours   MANDIBLE FRACTURE SURGERY     MASTECTOMY     MASTECTOMY MODIFIED RADICAL Right 07/10/2018   ypT2 ypN2a ER/PR positive, HER-2/neu negative   Surgeon: Robert Bellow, MD;  Location: [facility];  Service: General;  Laterality: Right;   PORT-A-CATH REMOVAL Left 02/07/2020   Procedure: REMOVAL PORT-A-CATH;  Surgeon: Wallace Going, DO;  Location: [place];  Service: Plastics;  Laterality: Left;   PORTACATH PLACEMENT Left 11/28/2017   Procedure: INSERTION PORT-A-CATH;  Surgeon: Robert Bellow, MD;  Location: [facility];  Service: General;  Laterality: Left;   REMOVAL OF TISSUE EXPANDER Right 06/05/2020   Procedure: TISSUE EXPANDER REMOVAL;  Surgeon: Wallace Going, DO;  Location: [facility];  Service: Plastics;  Laterality: Right;   SIMPLE MASTECTOMY WITH AXILLARY SENTINEL NODE BIOPSY Left 07/10/2018   High-grade DCIS SIMPLE MASTECTOMY;  Surgeon: Robert Bellow, MD;  Location: [facility];  Service: General;  Laterality: Left;   TISSUE EXPANDER PLACEMENT Left 06/05/2020   Procedure: TISSUE EXPANDER FLUID REMOVAL-100ML;  Surgeon: Wallace Going, DO;  Location: [facility];  Service: Plastics;  Laterality: Left;   TISSUE EXPANDER PLACEMENT Left 06/14/2020   Procedure: TISSUE EXPANDER REMOVAL;  Surgeon: Wallace Going, DO;  Location: [place];  Service: Plastics;  Laterality: Left;  45 min, please    Social History   Socioeconomic History   Marital status: Divorced    Spouse name: Not on file   Number of children: Not on file   Years of education: Not on file   Highest education level: Not  on file  Occupational History   Not on file  Tobacco Use   Smoking status: Former    Packs/day: 0.25    Years: 10.00    Pack years: 2.50    Types: Cigarettes    Quit date: 64    Years since quitting: 44.5   Smokeless tobacco: Never  Vaping Use   Vaping Use: Never used  Substance and Sexual Activity   Alcohol use: Yes    Alcohol/week: 1.0 standard drink    Types: 1 Glasses of wine per week    Comment: " occasional glass of wine "   Drug use: No   Sexual activity: Not Currently    Birth control/protection: Post-menopausal   Other Topics Concern   Not on file  Social History Narrative   Works in a clerical position at [employer]    Lives at home (son 13 yo lives with her)   Children 2   Pets: 3 small dogs and 1 frog    Caffeine- 1 cup of coffee in the morning, rare tea   Social Determinants of Health   Financial Resource Strain: Not on file  Food Insecurity: Not on file  Transportation Needs: Not on file  Physical Activity: Not on file  Stress: Not on file  Social Connections: Not on file  Intimate Partner Violence: Not on file    Family History  Problem Relation Age of Onset   Cancer Mother        Esophageal Cancer   Early death Mother        Age 43   Cancer Father        Lung Cancer   Diabetes Father    Diabetes Brother        Controlled with diet   Heart attack Paternal Grandfather    Colon cancer Neg Hx      Current Outpatient Medications:    b complex vitamins tablet, Take 1 tablet by mouth daily., Disp: , Rfl:    Calcium-Magnesium-Vitamin D (CALCIUM 1200+D3 PO), Take 1,200 mg by mouth daily. , Disp: , Rfl:    letrozole (FEMARA) 2.5 MG tablet, TAKE 1 TABLET BY MOUTH EVERY DAY, Disp: 90 tablet, Rfl: 1   loratadine (CLARITIN) 10 MG tablet, Take 10 mg by mouth every morning. , Disp: , Rfl:    Melatonin 5 MG CAPS, Take 5 mg by mouth daily as needed (sleep). , Disp: , Rfl:    Multiple Vitamin (MULTIVITAMIN WITH MINERALS) TABS tablet, Take 1 tablet by mouth daily. Centrum Silver, Disp: , Rfl:    Naphazoline HCl (CLEAR EYES OP), Place 1 drop into both eyes daily., Disp: , Rfl:    Probiotic Product (PROBIOTIC PO), Take 1 capsule by mouth daily., Disp: , Rfl:  No current facility-administered medications for this visit.  Facility-Administered Medications Ordered in Other Visits:    heparin lock flush 100 unit/mL, 500 Units, Intracatheter, PRN, Lequita Asal, MD  Physical exam:  Vitals:   05/22/21 1423  BP: 108/76  Pulse: 73  Resp: 18  Temp: (!) 96.9 F (36.1 C)  TempSrc: Tympanic   SpO2: 100%  Weight: 150 lb (68 kg)   Physical Exam Constitutional:      General: She is not in acute distress. Cardiovascular:     Rate and Rhythm: Normal rate and regular rhythm.     Heart sounds: Normal heart sounds.  Pulmonary:     Effort: Pulmonary effort is normal.     Breath sounds: Normal breath sounds.  Abdominal:  General: Bowel sounds are normal.     Palpations: Abdomen is soft.  Musculoskeletal:     Comments: No tenderness to palpation over lower back.  No swelling or deformity  Skin:    General: Skin is warm and dry.  Neurological:     General: No focal deficit present.     Mental Status: She is alert and oriented to person, place, and time.    Breast exam: Patient is s/p bilateral mastectomy with reconstruction and subsequent repair.  Well-healed surgical scar.  No evidence of chest wall recurrence.  No palpable bilateral axillary adenopathy   CMP Latest Ref Rng & Units 05/22/2021  Glucose 70 - 99 mg/dL 100(H)  BUN 8 - 23 mg/dL 14  Creatinine 0.44 - 1.00 mg/dL 1.10(H)  Sodium 135 - 145 mmol/L 137  Potassium 3.5 - 5.1 mmol/L 4.3  Chloride 98 - 111 mmol/L 99  CO2 22 - 32 mmol/L 28  Calcium 8.9 - 10.3 mg/dL 9.8  Total Protein 6.5 - 8.1 g/dL 7.8  Total Bilirubin 0.3 - 1.2 mg/dL 1.1  Alkaline Phos 38 - 126 U/L 49  AST 15 - 41 U/L 26  ALT 0 - 44 U/L 17   CBC Latest Ref Rng & Units 05/22/2021  WBC 4.0 - 10.5 K/uL 4.4  Hemoglobin 12.0 - 15.0 g/dL 14.9  Hematocrit 36.0 - 46.0 % 44.0  Platelets 150 - 400 K/uL 229    No images are attached to the encounter.  DG Lumbar Spine Complete  Result Date: 05/23/2021 CLINICAL DATA:  Low back pain. EXAM: LUMBAR SPINE - COMPLETE 4+ VIEW COMPARISON:  PET-CT dated November 24, 2017. FINDINGS: Five lumbar type vertebral bodies. No acute fracture or subluxation. Vertebral body heights are preserved. Alignment is normal. Unchanged mild multilevel disc height loss throughout the lumbar spine. The sacroiliac joints are unremarkable.  IMPRESSION: 1. Unchanged mild multilevel lumbar spondylosis. Electronically Signed   By: Titus Dubin M.D.   On: 05/23/2021 09:47     Assessment and plan- Patient is a 65 y.o. female with history of stage III right breast cancer T2 N3 M0 ER/PR positive HER2 negative and left breast DCIS s/p bilateral mastectomy currently on Femara here for routine follow-up  Clinically patient is doing well with no concerning signs and symptoms of recurrence based on today's exam.  Patient will continue Femara for 5 years ending in February 2025.  Patient is receiving Zometa every 6 months and will receive her next dose in October 2022.Recent bone density scan in March 2022 showed osteopenia of the left femur neck with T score of -1.7.  Low back pain: We will proceed with x-rays at this time and based on x-ray findings I will decide if she needs MRI versus referral to orthopedics.  I will see her back in 6 months with no labs   Visit Diagnosis 1. Acute bilateral low back pain without sciatica   2. Encounter for follow-up surveillance of breast cancer   3. Use of letrozole (Femara)   4. Long term (current) use of bisphosphonates      Dr. Randa Evens, MD, MPH [facility] at [facility] [phone] 05/31/2021 7:26 AM

## 2021-06-01 ENCOUNTER — Ambulatory Visit
Admission: RE | Admit: 2021-06-01 | Discharge: 2021-06-01 | Disposition: A | Discharge status: Home / Self Care | Payer: Medicare PPO | Source: Ambulatory Visit | Attending: Oncology | Admitting: Oncology

## 2021-06-01 ENCOUNTER — Ambulatory Visit
Admission: RE | Admit: 2021-06-01 | Discharge: 2021-06-01 | Disposition: A | Discharge status: Home / Self Care | Payer: Medicare PPO | Attending: Oncology | Admitting: Oncology

## 2021-06-01 DIAGNOSIS — M545 Low back pain, unspecified: Secondary | ICD-10-CM | POA: Diagnosis present

## 2021-06-01 DIAGNOSIS — C50411 Malignant neoplasm of upper-outer quadrant of right female breast: Secondary | ICD-10-CM

## 2021-06-28 ENCOUNTER — Ambulatory Visit: Payer: BC Managed Care – PPO | Admitting: Hematology and Oncology

## 2021-06-28 ENCOUNTER — Other Ambulatory Visit: Payer: BC Managed Care – PPO

## 2021-07-10 ENCOUNTER — Ambulatory Visit: Payer: BC Managed Care – PPO | Admitting: Plastic Surgery

## 2021-07-31 ENCOUNTER — Other Ambulatory Visit: Payer: Self-pay

## 2021-07-31 ENCOUNTER — Encounter: Payer: Self-pay | Admitting: Plastic Surgery

## 2021-07-31 ENCOUNTER — Ambulatory Visit: Payer: Medicare PPO | Admitting: Plastic Surgery

## 2021-07-31 DIAGNOSIS — Z9013 Acquired absence of bilateral breasts and nipples: Secondary | ICD-10-CM

## 2021-07-31 NOTE — Progress Notes (Signed)
The patient is a 65 year old female here for follow-up after breast surgery.  She had her implants removed.  The area is healing really well.  There is no sign of seroma or hematoma.  No sign of infection.  The incisions are nicely healed.  She is very happy with her decision and states that she does not have any more pain.  I would like to see her back in 1 year.  Pictures were obtained of the patient and placed in the chart with the patient's or guardian's permission.

## 2021-08-08 ENCOUNTER — Other Ambulatory Visit: Payer: Self-pay | Admitting: *Deleted

## 2021-08-08 DIAGNOSIS — C50411 Malignant neoplasm of upper-outer quadrant of right female breast: Secondary | ICD-10-CM

## 2021-08-08 MED ORDER — LETROZOLE 2.5 MG PO TABS: 2.5000 mg | ORAL_TABLET | Freq: Every day | ORAL | 1 refills | Status: DC

## 2021-08-20 ENCOUNTER — Other Ambulatory Visit: Payer: Self-pay | Admitting: *Deleted

## 2021-08-20 ENCOUNTER — Inpatient Hospital Stay: Payer: Medicare PPO | Attending: Oncology

## 2021-08-20 ENCOUNTER — Inpatient Hospital Stay: Payer: Medicare PPO

## 2021-08-20 ENCOUNTER — Other Ambulatory Visit: Payer: Self-pay | Admitting: Oncology

## 2021-08-20 VITALS — BP 131/80 | HR 67 | Temp 96.0°F | Resp 18

## 2021-08-20 DIAGNOSIS — Z170 Estrogen receptor positive status [ER+]: Secondary | ICD-10-CM | POA: Diagnosis not present

## 2021-08-20 DIAGNOSIS — C50411 Malignant neoplasm of upper-outer quadrant of right female breast: Secondary | ICD-10-CM

## 2021-08-20 DIAGNOSIS — Z79899 Other long term (current) drug therapy: Secondary | ICD-10-CM | POA: Insufficient documentation

## 2021-08-20 DIAGNOSIS — Z79811 Long term (current) use of aromatase inhibitors: Secondary | ICD-10-CM

## 2021-08-20 DIAGNOSIS — C50911 Malignant neoplasm of unspecified site of right female breast: Secondary | ICD-10-CM | POA: Insufficient documentation

## 2021-08-20 DIAGNOSIS — D701 Agranulocytosis secondary to cancer chemotherapy: Secondary | ICD-10-CM

## 2021-08-20 DIAGNOSIS — M85852 Other specified disorders of bone density and structure, left thigh: Secondary | ICD-10-CM

## 2021-08-20 LAB — COMPREHENSIVE METABOLIC PANEL
ALT: 18 U/L (ref 0–44)
AST: 26 U/L (ref 15–41)
Albumin: 4.5 g/dL (ref 3.5–5.0)
Alkaline Phosphatase: 48 U/L (ref 38–126)
Anion gap: 8 (ref 5–15)
BUN: 12 mg/dL (ref 8–23)
CO2: 27 mmol/L (ref 22–32)
Calcium: 9.5 mg/dL (ref 8.9–10.3)
Chloride: 101 mmol/L (ref 98–111)
Creatinine, Ser: 0.85 mg/dL (ref 0.44–1.00)
GFR, Estimated: >60 mL/min (ref >60)
Glucose, Bld: 101 mg/dL — ABNORMAL HIGH (ref 70–99)
Potassium: 3.8 mmol/L (ref 3.5–5.1)
Sodium: 136 mmol/L (ref 135–145)
Total Bilirubin: 0.7 mg/dL (ref 0.3–1.2)
Total Protein: 7.9 g/dL (ref 6.5–8.1)

## 2021-08-20 MED ORDER — ZOLEDRONIC ACID 4 MG/100ML IV SOLN
4.0000 mg | Freq: Once | INTRAVENOUS | Status: AC
  Administered 2021-08-20: 4 mg via INTRAVENOUS
  Filled 2021-08-20: qty 100

## 2021-08-20 MED ORDER — SODIUM CHLORIDE 0.9 % IV SOLN
Freq: Once | INTRAVENOUS | Status: AC
  Filled 2021-08-20: qty 250

## 2021-08-20 NOTE — Patient Instructions (Signed)

## 2021-09-10 ENCOUNTER — Encounter: Payer: Self-pay | Admitting: Hematology and Oncology

## 2021-09-10 ENCOUNTER — Encounter: Payer: Self-pay | Admitting: Oncology

## 2021-09-10 ENCOUNTER — Ambulatory Visit (INDEPENDENT_AMBULATORY_CARE_PROVIDER_SITE_OTHER): Payer: Medicare PPO | Admitting: Family

## 2021-09-10 ENCOUNTER — Other Ambulatory Visit: Payer: Self-pay

## 2021-09-10 ENCOUNTER — Other Ambulatory Visit (HOSPITAL_COMMUNITY)
Admission: RE | Admit: 2021-09-10 | Discharge: 2021-09-10 | Disposition: A | Discharge status: Home / Self Care | Payer: Medicare PPO | Source: Ambulatory Visit | Attending: Family | Admitting: Family

## 2021-09-10 ENCOUNTER — Encounter: Payer: Self-pay | Admitting: Family

## 2021-09-10 VITALS — BP 126/78 | HR 79 | Temp 98.0°F | Ht 65.0 in | Wt 151.4 lb

## 2021-09-10 DIAGNOSIS — Z01419 Encounter for gynecological examination (general) (routine) without abnormal findings: Secondary | ICD-10-CM | POA: Diagnosis present

## 2021-09-10 DIAGNOSIS — Z0001 Encounter for general adult medical examination with abnormal findings: Secondary | ICD-10-CM

## 2021-09-10 DIAGNOSIS — Z0000 Encounter for general adult medical examination without abnormal findings: Secondary | ICD-10-CM

## 2021-09-10 DIAGNOSIS — Z1151 Encounter for screening for human papillomavirus (HPV): Secondary | ICD-10-CM | POA: Diagnosis not present

## 2021-09-10 DIAGNOSIS — G8929 Other chronic pain: Secondary | ICD-10-CM

## 2021-09-10 DIAGNOSIS — M545 Low back pain, unspecified: Secondary | ICD-10-CM

## 2021-09-10 NOTE — Progress Notes (Signed)
Subjective:    Patient ID: Megan Vega, female    DOB: Sep 14, 1956, 65 y.o.   MRN: [id number]  CC: Megan Vega is a 65 y.o. female who presents today for physical exam.    HPI: Complains of dull low back pain x 4 months, present daily.  It is not unbearable She is stretching change in pain.  Worse after heavy lifting so very conscious of this and tries to avoid. No numbness, saddle anesthesia, groin pain, abdominal pain, constipation, dysuria Occasional she has tried tylenol or ibuprofen with temporarily relief.  No h/o renal stone.   Dr Janese Banks had lumbar and sacral xrays done 05/2021  H/o [place] Colorectal Cancer Screening: UTD , repeat in 5 years.  Breast Cancer Screening: No longer screening for breast cancer. H/o bilateral mastectomies . H/o right breast cancer.   Cervical Cancer Screening: due Bone Health screening/DEXA for 65+: UTD, following with Dr Janese Banks  Lung Cancer Screening: Doesn't have 20 year pack year history and age > 80 years yo 72 years.         Tetanus - UTD        Pneumococcal - Candidate for PCV20, declines   Labs: Screening labs today. Exercise: Gets regular exercise.Treadmill 2-3 per week   Alcohol use:  occasional Smoking/tobacco use: former smoker.     HISTORY:  Past Medical History:  Diagnosis Date  . Ankle fracture   . Breast cancer ([place])    bilateral  . Cancer ([place])    Basal Cell Carcinoma, about 2011 chest  . Cataract   . Family history of adverse reaction to anesthesia    daughter with PONV  . Hemorrhoids   . History of methicillin resistant staphylococcus aureus (MRSA) 2015  . Migraines    MIGRAINES occ  . Personal history of chemotherapy   . Personal history of radiation therapy   . Wears glasses     Past Surgical History:  Procedure Laterality Date  . BREAST BIOPSY Bilateral 10/29/2017   L breast DCIS, R breast invasive mammary carcinoma of no special type, ER/PR+, Her2/neu 1+  . BREAST RECONSTRUCTION Right 02/07/2020    Procedure: BREAST RECONSTRUCTION;  Surgeon: Wallace Going, DO;  Location: [place];  Service: Plastics;  Laterality: Right;  . BREAST RECONSTRUCTION WITH PLACEMENT OF TISSUE EXPANDER AND FLEX HD (ACELLULAR HYDRATED DERMIS) Bilateral 08/30/2019   Procedure: BREAST RECONSTRUCTION WITH PLACEMENT OF TISSUE EXPANDER AND FLEX HD (ACELLULAR HYDRATED DERMIS);  Surgeon: Wallace Going, DO;  Location: [facility];  Service: Plastics;  Laterality: Bilateral;  2.5 hours, please  . CESAREAN SECTION    . COLONOSCOPY  2012  . COLONOSCOPY WITH PROPOFOL N/A 12/01/2018   Procedure: COLONOSCOPY WITH PROPOFOL;  Surgeon: Lucilla Lame, MD;  Location: [facility];  Service: Endoscopy;  Laterality: N/A;  . DEBRIDEMENT AND CLOSURE WOUND Bilateral 10/02/2020   Procedure: Excision of bilateral breast wound;  Surgeon: Wallace Going, DO;  Location: [place];  Service: Plastics;  Laterality: Bilateral;  1.5 hours, please  . FRACTURE SURGERY     ankle  . IRRIGATION AND DEBRIDEMENT ABSCESS Right 03/12/2018   Procedure: IRRIGATION AND DEBRIDEMENT RIGHT AXILLARY ABSCESS;  Surgeon: Robert Bellow, MD;  Location: [facility];  Service: General;  Laterality: Right;  . LATISSIMUS FLAP TO BREAST Right 02/07/2020   Procedure: LATISSIMUS MYOCUTANEOUS FLAP TO BREAST;  Surgeon: Wallace Going, DO;  Location: [place];  Service: Plastics;  Laterality: Right;  3 hours  . MANDIBLE FRACTURE SURGERY    .  MASTECTOMY    . MASTECTOMY MODIFIED RADICAL Right 07/10/2018   ypT2 ypN2a ER/PR positive, HER-2/neu negative  Surgeon: Robert Bellow, MD;  Location: [facility];  Service: General;  Laterality: Right;  . PORT-A-CATH REMOVAL Left 02/07/2020   Procedure: REMOVAL PORT-A-CATH;  Surgeon: Wallace Going, DO;  Location: [place];  Service: Plastics;  Laterality: Left;  . PORTACATH PLACEMENT Left 11/28/2017   Procedure: INSERTION PORT-A-CATH;  Surgeon: Robert Bellow, MD;  Location: [facility];  Service:  General;  Laterality: Left;  . REMOVAL OF TISSUE EXPANDER Right 06/05/2020   Procedure: TISSUE EXPANDER REMOVAL;  Surgeon: Wallace Going, DO;  Location: [facility];  Service: Plastics;  Laterality: Right;  . SIMPLE MASTECTOMY WITH AXILLARY SENTINEL NODE BIOPSY Left 07/10/2018   High-grade DCIS SIMPLE MASTECTOMY;  Surgeon: Robert Bellow, MD;  Location: [facility];  Service: General;  Laterality: Left;  . TISSUE EXPANDER PLACEMENT Left 06/05/2020   Procedure: TISSUE EXPANDER FLUID REMOVAL-100ML;  Surgeon: Wallace Going, DO;  Location: [facility];  Service: Plastics;  Laterality: Left;  . TISSUE EXPANDER PLACEMENT Left 06/14/2020   Procedure: TISSUE EXPANDER REMOVAL;  Surgeon: Wallace Going, DO;  Location: [place];  Service: Plastics;  Laterality: Left;  45 min, please   Family History  Problem Relation Age of Onset  . Cancer Mother        Esophageal Cancer  . Early death Mother        Age 49  . Cancer Father        Lung Cancer  . Diabetes Father   . Diabetes Brother        Controlled with diet  . Heart attack Paternal Grandfather   . Colon cancer Neg Hx       ALLERGIES: Peanut-containing drug products  Current Outpatient Medications on File Prior to Visit  Medication Sig Dispense Refill  . b complex vitamins tablet Take 1 tablet by mouth daily.    . Calcium-Magnesium-Vitamin D (CALCIUM 1200+D3 PO) Take 1,200 mg by mouth daily.     Marland Kitchen letrozole (FEMARA) 2.5 MG tablet Take 1 tablet (2.5 mg total) by mouth daily. 90 tablet 1  . loratadine (CLARITIN) 10 MG tablet Take 10 mg by mouth every morning.     . Melatonin 5 MG CAPS Take 5 mg by mouth daily as needed (sleep).     . Multiple Vitamin (MULTIVITAMIN WITH MINERALS) TABS tablet Take 1 tablet by mouth daily. Centrum Silver    . Naphazoline HCl (CLEAR EYES OP) Place 1 drop into both eyes daily.    . Probiotic Product (PROBIOTIC PO) Take 1 capsule by mouth daily.     Current Facility-Administered Medications on File Prior  to Visit  Medication Dose Route Frequency Provider Last Rate Last Admin  . heparin lock flush 100 unit/mL  500 Units Intracatheter PRN Lequita Asal, MD        Social History   Tobacco Use  . Smoking status: Former    Packs/day: 0.25    Years: 10.00    Pack years: 2.50    Types: Cigarettes    Quit date: 1978    Years since quitting: 44.8  . Smokeless tobacco: Never  Vaping Use  . Vaping Use: Never used  Substance Use Topics  . Alcohol use: Yes    Alcohol/week: 1.0 standard drink    Types: 1 Glasses of wine per week    Comment: " occasional glass of wine "  . Drug use: No  Review of Systems  Constitutional:  Negative for chills, fever and unexpected weight change.  HENT:  Negative for congestion.   Respiratory:  Negative for cough.   Cardiovascular:  Negative for chest pain, palpitations and leg swelling.  Gastrointestinal:  Negative for abdominal pain, nausea and vomiting.  Musculoskeletal:  Positive for back pain. Negative for arthralgias and myalgias.  Skin:  Negative for rash.  Neurological:  Negative for headaches.  Hematological:  Negative for adenopathy.  Psychiatric/Behavioral:  Negative for confusion.      Objective:    BP 126/78 (BP Location: Left Arm, Patient Position: Sitting, Cuff Size: Normal)   Pulse 79   Temp 98 F (36.7 C) (Oral)   Ht 5' 5"  (1.651 m)   Wt 151 lb 6.4 oz (68.7 kg)   LMP  (LMP Unknown)   SpO2 97%   BMI 25.19 kg/m   BP Readings from Last 3 Encounters:  09/10/21 126/78  08/20/21 131/80  05/22/21 108/76   Wt Readings from Last 3 Encounters:  09/10/21 151 lb 6.4 oz (68.7 kg)  05/22/21 150 lb (68 kg)  12/27/20 153 lb 1.8 oz (69.4 kg)    Physical Exam Vitals reviewed.  Constitutional:      Appearance: Normal appearance. She is well-developed.  Eyes:     Conjunctiva/sclera: Conjunctivae normal.  Neck:     Thyroid: No thyroid mass or thyromegaly.  Cardiovascular:     Rate and Rhythm: Normal rate and regular rhythm.      Pulses: Normal pulses.     Heart sounds: Normal heart sounds.  Pulmonary:     Effort: Pulmonary effort is normal.     Breath sounds: Normal breath sounds. No wheezing, rhonchi or rales.  Chest:     Comments: Double mastectomy Abdominal:     General: Bowel sounds are normal. There is no distension.     Palpations: Abdomen is soft. Abdomen is not rigid. There is no fluid wave or mass.     Tenderness: There is no abdominal tenderness. There is no guarding or rebound.  Genitourinary:    Cervix: No cervical motion tenderness, discharge or friability.     Uterus: Not enlarged, not fixed and not tender.      Adnexa:        Right: No mass, tenderness or fullness.         Left: No mass, tenderness or fullness.       Comments: Pap performed. No CMT. Unable to appreciated ovaries. Musculoskeletal:     Thoracic back: No lacerations, tenderness or bony tenderness. Normal range of motion.     Lumbar back: Tenderness present. No swelling, edema, spasms or bony tenderness. Normal range of motion. Negative right straight leg raise test and negative left straight leg raise test.       Back:     Comments: Full range of motion with flexion, tension, lateral side bends. No bony tenderness. No pain, numbness, tingling elicited with single leg raise bilaterally.  Tenderness with deep palpation of right SI joint.  Lymphadenopathy:     Head:     Right side of head: No submental, submandibular, tonsillar, preauricular, posterior auricular or occipital adenopathy.     Left side of head: No submental, submandibular, tonsillar, preauricular, posterior auricular or occipital adenopathy.     Cervical:     Right cervical: No superficial, deep or posterior cervical adenopathy.    Left cervical: No superficial, deep or posterior cervical adenopathy.     Upper Body:  Right upper body: No pectoral adenopathy.     Left upper body: No pectoral adenopathy.  Skin:    General: Skin is warm and dry.   Neurological:     Mental Status: She is alert.     Sensory: No sensory deficit.     Deep Tendon Reflexes:     Reflex Scores:      Patellar reflexes are 2+ on the right side and 2+ on the left side.    Comments: Sensation and strength intact bilateral lower extremities.  Psychiatric:        Speech: Speech normal.        Behavior: Behavior normal.        Thought Content: Thought content normal.       Assessment & Plan:   Problem List Items Addressed This Visit       Other   Left low back pain    Presentation consistent with right SI joint dysfunction.  Reviewed thoracic and sacral x-rays which showed spondylosis.  Advised trial of heat and ice to see what is more comfortable for patient.  We agreed physical therapy is an appropriate next step.  If pain does not respond or improve, would consult orthopedics and also consider CT abdomen pelvis for look for GI, GU etiologies in particular in the setting of history of breast cancer.  Patient will let me how she is doing      Relevant Orders   Ambulatory referral to Physical Therapy   Urinalysis, Routine w reflex microscopic   Urine Culture   Routine physical examination - Primary    Pap smear performed.  Deferred clinical breast exam in the absence of breast tissue and double mastectomy.  She is no longer screening for breast cancer.  She eclines pneumonia vaccine today.  We will give her PCV 20 at follow-up.  Encouraged continued exercise      Relevant Orders   Cytology - PAP   Hemoglobin A1c   CBC with Differential/Platelet   Lipid panel   TSH   VITAMIN D 25 Hydroxy (Vit-D Deficiency, Fractures)     I am having Megan Vega maintain her b complex vitamins, multivitamin with minerals, Probiotic Product (PROBIOTIC PO), loratadine, Calcium-Magnesium-Vitamin D (CALCIUM 1200+D3 PO), Melatonin, Naphazoline HCl (CLEAR EYES OP), and letrozole.   No orders of the defined types were placed in this encounter.   Return  precautions given.   Risks, benefits, and alternatives of the medications and treatment plan prescribed today were discussed, and patient expressed understanding.   Education regarding symptom management and diagnosis given to patient on AVS.   Continue to follow with Burnard Hawthorne, FNP for routine health maintenance.   Megan Vega and I agreed with plan.   Mable Paris, FNP

## 2021-09-10 NOTE — Patient Instructions (Signed)
Please employ the use of heat for low back pain, or icing and see what feels better to you.  Gentle stretching.  I placed referral to physical therapy as discussed.  Physical therapy if all makes pain worse or does not resolve pain, please let me know and we can start further evaluation.  Health Maintenance for Postmenopausal Women Menopause is a normal process in which your ability to get pregnant comes to an end. This process happens slowly over many months or years, usually between the ages of 82 and 69. Menopause is complete when you have missed your menstrual periods for 12 months. It is important to talk with your health care provider about some of the most common conditions that affect women after menopause (postmenopausal women). These include heart disease, cancer, and bone loss (osteoporosis). Adopting a healthy lifestyle and getting preventive care can help to promote your health and wellness. The actions you take can also lower your chances of developing some of these common conditions. What should I know about menopause? During menopause, you may get a number of symptoms, such as: Hot flashes. These can be moderate or severe. Night sweats. Decrease in sex drive. Mood swings. Headaches. Tiredness. Irritability. Memory problems. Insomnia. Choosing to treat or not to treat these symptoms is a decision that you make with your health care provider. Do I need hormone replacement therapy? Hormone replacement therapy is effective in treating symptoms that are caused by menopause, such as hot flashes and night sweats. Hormone replacement carries certain risks, especially as you become older. If you are thinking about using estrogen or estrogen with progestin, discuss the benefits and risks with your health care provider. What is my risk for heart disease and stroke? The risk of heart disease, heart attack, and stroke increases as you age. One of the causes may be a change in the body's  hormones during menopause. This can affect how your body uses dietary fats, triglycerides, and cholesterol. Heart attack and stroke are medical emergencies. There are many things that you can do to help prevent heart disease and stroke. Watch your blood pressure High blood pressure causes heart disease and increases the risk of stroke. This is more likely to develop in people who have high blood pressure readings, are of African descent, or are overweight. Have your blood pressure checked: Every 3-5 years if you are 67-64 years of age. Every year if you are 71 years old or older. Eat a healthy diet  Eat a diet that includes plenty of vegetables, fruits, low-fat dairy products, and lean protein. Do not eat a lot of foods that are high in solid fats, added sugars, or sodium. Get regular exercise Get regular exercise. This is one of the most important things you can do for your health. Most adults should: Try to exercise for at least 150 minutes each week. The exercise should increase your heart rate and make you sweat (moderate-intensity exercise). Try to do strengthening exercises at least twice each week. Do these in addition to the moderate-intensity exercise. Spend less time sitting. Even light physical activity can be beneficial. Other tips Work with your health care provider to achieve or maintain a healthy weight. Do not use any products that contain nicotine or tobacco, such as cigarettes, e-cigarettes, and chewing tobacco. If you need help quitting, ask your health care provider. Know your numbers. Ask your health care provider to check your cholesterol and your blood sugar (glucose). Continue to have your blood tested as directed by  your health care provider. Do I need screening for cancer? Depending on your health history and family history, you may need to have cancer screening at different stages of your life. This may include screening for: Breast cancer. Cervical cancer. Lung  cancer. Colorectal cancer. What is my risk for osteoporosis? After menopause, you may be at increased risk for osteoporosis. Osteoporosis is a condition in which bone destruction happens more quickly than new bone creation. To help prevent osteoporosis or the bone fractures that can happen because of osteoporosis, you may take the following actions: If you are 2-11 years old, get at least 1,000 mg of calcium and at least 600 mg of vitamin D per day. If you are older than age 20 but younger than age 37, get at least 1,200 mg of calcium and at least 600 mg of vitamin D per day. If you are older than age 48, get at least 1,200 mg of calcium and at least 800 mg of vitamin D per day. Smoking and drinking excessive alcohol increase the risk of osteoporosis. Eat foods that are rich in calcium and vitamin D, and do weight-bearing exercises several times each week as directed by your health care provider. How does menopause affect my mental health? Depression may occur at any age, but it is more common as you become older. Common symptoms of depression include: Low or sad mood. Changes in sleep patterns. Changes in appetite or eating patterns. Feeling an overall lack of motivation or enjoyment of activities that you previously enjoyed. Frequent crying spells. Talk with your health care provider if you think that you are experiencing depression. General instructions See your health care provider for regular wellness exams and vaccines. This may include: Scheduling regular health, dental, and eye exams. Getting and maintaining your vaccines. These include: Influenza vaccine. Get this vaccine each year before the flu season begins. Pneumonia vaccine. Shingles vaccine. Tetanus, diphtheria, and pertussis (Tdap) booster vaccine. Your health care provider may also recommend other immunizations. Tell your health care provider if you have ever been abused or do not feel safe at home. Summary Menopause is a  normal process in which your ability to get pregnant comes to an end. This condition causes hot flashes, night sweats, decreased interest in sex, mood swings, headaches, or lack of sleep. Treatment for this condition may include hormone replacement therapy. Take actions to keep yourself healthy, including exercising regularly, eating a healthy diet, watching your weight, and checking your blood pressure and blood sugar levels. Get screened for cancer and depression. Make sure that you are up to date with all your vaccines. This information is not intended to replace advice given to you by your health care provider. Make sure you discuss any questions you have with your health care provider. Document Revised: 10/28/2018 Document Reviewed: 10/28/2018 Elsevier Patient Education  2022 [employer].

## 2021-09-10 NOTE — Assessment & Plan Note (Addendum)
Pap smear performed.  Deferred clinical breast exam in the absence of breast tissue and double mastectomy.  She is no longer screening for breast cancer.  She eclines pneumonia vaccine today.  We will give her PCV 20 at follow-up.  Encouraged continued exercise

## 2021-09-10 NOTE — Assessment & Plan Note (Signed)
Presentation consistent with right SI joint dysfunction.  Reviewed thoracic and sacral x-rays which showed spondylosis.  Advised trial of heat and ice to see what is more comfortable for patient.  We agreed physical therapy is an appropriate next step.  If pain does not respond or improve, would consult orthopedics and also consider CT abdomen pelvis for look for GI, GU etiologies in particular in the setting of history of breast cancer.  Patient will let me how she is doing

## 2021-09-11 LAB — URINALYSIS, ROUTINE W REFLEX MICROSCOPIC
Bilirubin Urine: NEGATIVE
Ketones, ur: NEGATIVE
Leukocytes,Ua: NEGATIVE
Nitrite: NEGATIVE
Specific Gravity, Urine: 1.01 (ref 1.000–1.030)
Total Protein, Urine: NEGATIVE
Urine Glucose: NEGATIVE
Urobilinogen, UA: 0.2 (ref 0.0–1.0)
pH: 6 (ref 5.0–8.0)

## 2021-09-11 LAB — URINE CULTURE
MICRO NUMBER:: 12542077
Result:: NO GROWTH
SPECIMEN QUALITY:: ADEQUATE

## 2021-09-12 LAB — CYTOLOGY - PAP
Comment: NEGATIVE
Diagnosis: NEGATIVE
High risk HPV: NEGATIVE

## 2021-09-14 ENCOUNTER — Telehealth: Payer: Self-pay

## 2021-09-14 NOTE — Telephone Encounter (Signed)
-----   Message from Burnard Hawthorne, [place] sent at 09/12/2021  4:43 PM EDT ----- CALL-  Blood is present in your urine without evidence of infection.  I would recommend further consultation with urology.  Please let me know if agreeable and I will place referral Pap smear is negative for malignancy, HPV Let me know if cannot reach patient.

## 2021-09-25 ENCOUNTER — Other Ambulatory Visit (INDEPENDENT_AMBULATORY_CARE_PROVIDER_SITE_OTHER): Payer: Medicare PPO

## 2021-09-25 ENCOUNTER — Other Ambulatory Visit: Payer: Self-pay

## 2021-09-25 DIAGNOSIS — Z0000 Encounter for general adult medical examination without abnormal findings: Secondary | ICD-10-CM

## 2021-09-25 LAB — CBC WITH DIFFERENTIAL/PLATELET
Basophils Absolute: 0.1 10*3/uL (ref 0.0–0.1)
Basophils Relative: 2.1 % (ref 0.0–3.0)
Eosinophils Absolute: 0.2 10*3/uL (ref 0.0–0.7)
Eosinophils Relative: 3.5 % (ref 0.0–5.0)
HCT: 44.2 % (ref 36.0–46.0)
Hemoglobin: 14.8 g/dL (ref 12.0–15.0)
Lymphocytes Relative: 24.7 % (ref 12.0–46.0)
Lymphs Abs: 1.1 10*3/uL (ref 0.7–4.0)
MCHC: 33.5 g/dL (ref 30.0–36.0)
MCV: 88.9 fl (ref 78.0–100.0)
Monocytes Absolute: 0.5 10*3/uL (ref 0.1–1.0)
Monocytes Relative: 10.1 % (ref 3.0–12.0)
Neutro Abs: 2.7 10*3/uL (ref 1.4–7.7)
Neutrophils Relative %: 59.6 % (ref 43.0–77.0)
Platelets: 237 10*3/uL (ref 150.0–400.0)
RBC: 4.97 Mil/uL (ref 3.87–5.11)
RDW: 12.7 % (ref 11.5–15.5)
WBC: 4.5 10*3/uL (ref 4.0–10.5)

## 2021-09-25 LAB — LIPID PANEL
Cholesterol: 199 mg/dL (ref 0–200)
HDL: 57 mg/dL (ref >39.00)
LDL Cholesterol: 112 mg/dL — ABNORMAL HIGH (ref 0–99)
NonHDL: 142.27
Total CHOL/HDL Ratio: 3
Triglycerides: 152 mg/dL — ABNORMAL HIGH (ref 0.0–149.0)
VLDL: 30.4 mg/dL (ref 0.0–40.0)

## 2021-09-25 LAB — VITAMIN D 25 HYDROXY (VIT D DEFICIENCY, FRACTURES): VITD: 39.99 ng/mL (ref 30.00–100.00)

## 2021-09-25 LAB — TSH: TSH: 1.42 u[IU]/mL (ref 0.35–5.50)

## 2021-09-25 LAB — HEMOGLOBIN A1C: Hgb A1c MFr Bld: 6 % (ref 4.6–6.5)

## 2021-10-09 ENCOUNTER — Encounter: Payer: Self-pay | Admitting: Hematology and Oncology

## 2021-10-09 ENCOUNTER — Encounter: Payer: Self-pay | Admitting: Oncology

## 2021-10-15 ENCOUNTER — Encounter: Payer: Self-pay | Admitting: Oncology

## 2021-10-15 ENCOUNTER — Encounter: Payer: Self-pay | Admitting: Hematology and Oncology

## 2021-11-16 ENCOUNTER — Encounter: Payer: Self-pay | Admitting: Hematology and Oncology

## 2021-11-16 ENCOUNTER — Encounter: Payer: Self-pay | Admitting: Oncology

## 2021-11-23 ENCOUNTER — Inpatient Hospital Stay: Payer: Medicare PPO | Attending: Oncology | Admitting: Oncology

## 2021-11-23 ENCOUNTER — Other Ambulatory Visit: Payer: Self-pay

## 2021-11-23 ENCOUNTER — Encounter: Payer: Self-pay | Admitting: Oncology

## 2021-11-23 VITALS — BP 115/71 | HR 74 | Temp 97.5°F | Resp 16 | Wt 155.6 lb

## 2021-11-23 DIAGNOSIS — Z79899 Other long term (current) drug therapy: Secondary | ICD-10-CM | POA: Insufficient documentation

## 2021-11-23 DIAGNOSIS — Z801 Family history of malignant neoplasm of trachea, bronchus and lung: Secondary | ICD-10-CM | POA: Diagnosis not present

## 2021-11-23 DIAGNOSIS — Z85828 Personal history of other malignant neoplasm of skin: Secondary | ICD-10-CM | POA: Insufficient documentation

## 2021-11-23 DIAGNOSIS — Z853 Personal history of malignant neoplasm of breast: Secondary | ICD-10-CM | POA: Diagnosis not present

## 2021-11-23 DIAGNOSIS — Z800 Family history of malignant neoplasm of digestive organs: Secondary | ICD-10-CM | POA: Diagnosis not present

## 2021-11-23 DIAGNOSIS — Z923 Personal history of irradiation: Secondary | ICD-10-CM | POA: Insufficient documentation

## 2021-11-23 DIAGNOSIS — Z170 Estrogen receptor positive status [ER+]: Secondary | ICD-10-CM | POA: Insufficient documentation

## 2021-11-23 DIAGNOSIS — G8929 Other chronic pain: Secondary | ICD-10-CM | POA: Insufficient documentation

## 2021-11-23 DIAGNOSIS — Z9221 Personal history of antineoplastic chemotherapy: Secondary | ICD-10-CM | POA: Diagnosis not present

## 2021-11-23 DIAGNOSIS — M545 Low back pain, unspecified: Secondary | ICD-10-CM | POA: Diagnosis not present

## 2021-11-23 DIAGNOSIS — Z08 Encounter for follow-up examination after completed treatment for malignant neoplasm: Secondary | ICD-10-CM | POA: Diagnosis not present

## 2021-11-23 DIAGNOSIS — C50911 Malignant neoplasm of unspecified site of right female breast: Secondary | ICD-10-CM | POA: Insufficient documentation

## 2021-11-23 DIAGNOSIS — M8580 Other specified disorders of bone density and structure, unspecified site: Secondary | ICD-10-CM | POA: Insufficient documentation

## 2021-11-23 DIAGNOSIS — C50411 Malignant neoplasm of upper-outer quadrant of right female breast: Secondary | ICD-10-CM

## 2021-11-23 DIAGNOSIS — Z79811 Long term (current) use of aromatase inhibitors: Secondary | ICD-10-CM | POA: Diagnosis not present

## 2021-11-23 NOTE — Progress Notes (Signed)
Patient here for follow up VS stable and WNL, she has no questions or complaints today.

## 2021-11-25 ENCOUNTER — Encounter: Payer: Self-pay | Admitting: Oncology

## 2021-11-25 ENCOUNTER — Encounter: Payer: Self-pay | Admitting: Hematology and Oncology

## 2021-11-25 NOTE — Progress Notes (Signed)
Hematology/Oncology Consult note [facility]  [phone] [phone]  Patient Care Team: Burnard Hawthorne, FNP as PCP - General (Family Medicine) Rico Junker, RN as Oncology Nurse Navigator Byrnett, Forest Gleason, MD (General Surgery) Leone Haven, MD as Consulting Physician (Family Medicine) Vladimir Crofts, MD as Consulting Physician (Neurology) Sindy Guadeloupe, MD as Consulting Physician (Oncology)   Name of the patient: Megan Vega  [id number]  September 12, 1956   Date of visit: 11/25/21  Diagnosis- stage III right breast cancer and left breast DCIS in 2019  Chief complaint/ Reason for visit-routine follow-up of breast cancer  Heme/Onc history:  Megan Vega is a 66 y.o. female with clinical stage T2N3bM0 right breast cancer and DCIS in the left breast s/p neoadjuvant chemotherapy followed by left simple mastectomy and right modified radical mastectomy on 07/10/2018.  Biopsy showed 1.4 cm grade 2 invasive mammary carcinoma ER greater than 90% positive PR greater than 90% positive and HER2 negative +1   Patient received neoadjuvant AC Taxol chemotherapy until July 2019.   Left breast pathology revealed 2.8 cm residual high grade DCIS, comedo type.  There was atypical lobular hyperplasia.  Four sentinel lymph nodes were negative.  Right breast pathology revealed 3.6 cm residual grade II invasive mammary carcinoma and high grade DCIS.  There was lymphovascular invasion.There was metastatic carcinoma in 2 sentinel lymph nodes.  Right axillary dissection revealed metastatic carcinoma in 7 lymph nodes.  There were a total of 9 lymph nodes with macrometastasis (largest 22 mm).  Pathologic stage was ypT2 ypN2a.   Patient then received adjuvant radiation treatment and started letrozole in February 2020   She underwent right breast reconstruction with latissimus myocutaneous flap and removal of her port-a-cath on 02/07/2020.  Expanders  were taken out in 05/2020 secondary to infection.  She underwent excision of left breast wound with closure on 10/02/2020. Pathology revealed inflamed granulation tissue and fibrosis. There was no evidence of malignancy.       Patient has baseline osteopenia and receives Zometa every 6 months  Interval history-patient is currently doing well.Denies any specific complaints at this time.  She does have chronic low back pain for which she undergoes physical therapy.  Denies any specific breast concerns at this time  ECOG PS- 1 Pain scale- 0   Review of systems- Review of Systems  Constitutional:  Negative for chills, fever, malaise/fatigue and weight loss.  HENT:  Negative for congestion, ear discharge and nosebleeds.   Eyes:  Negative for blurred vision.  Respiratory:  Negative for cough, hemoptysis, sputum production, shortness of breath and wheezing.   Cardiovascular:  Negative for chest pain, palpitations, orthopnea and claudication.  Gastrointestinal:  Negative for abdominal pain, blood in stool, constipation, diarrhea, heartburn, melena, nausea and vomiting.  Genitourinary:  Negative for dysuria, flank pain, frequency, hematuria and urgency.  Musculoskeletal:  Negative for back pain, joint pain and myalgias.  Skin:  Negative for rash.  Neurological:  Negative for dizziness, tingling, focal weakness, seizures, weakness and headaches.  Endo/Heme/Allergies:  Does not bruise/bleed easily.  Psychiatric/Behavioral:  Negative for depression and suicidal ideas. The patient does not have insomnia.      Allergies  Allergen Reactions   Peanut-Containing Drug Products Swelling and Other (See Comments)    Only when eating too many peanuts, swelling with lips and tingly face     Past Medical History:  Diagnosis Date   Ankle fracture    Breast cancer ([place])    bilateral  Cancer [facility])    Basal Cell Carcinoma, about 2011 chest   Cataract    Family history of adverse reaction to anesthesia     daughter with PONV   Hemorrhoids    History of methicillin resistant staphylococcus aureus (MRSA) 2015   Migraines    MIGRAINES occ   Personal history of chemotherapy    Personal history of radiation therapy    Wears glasses      Past Surgical History:  Procedure Laterality Date   BREAST BIOPSY Bilateral 10/29/2017   L breast DCIS, R breast invasive mammary carcinoma of no special type, ER/PR+, Her2/neu 1+   BREAST RECONSTRUCTION Right 02/07/2020   Procedure: BREAST RECONSTRUCTION;  Surgeon: Wallace Going, DO;  Location: [place];  Service: Plastics;  Laterality: Right;   BREAST RECONSTRUCTION WITH PLACEMENT OF TISSUE EXPANDER AND FLEX HD (ACELLULAR HYDRATED DERMIS) Bilateral 08/30/2019   Procedure: BREAST RECONSTRUCTION WITH PLACEMENT OF TISSUE EXPANDER AND FLEX HD (ACELLULAR HYDRATED DERMIS);  Surgeon: Wallace Going, DO;  Location: [facility];  Service: Plastics;  Laterality: Bilateral;  2.5 hours, please   CESAREAN SECTION     COLONOSCOPY  2012   COLONOSCOPY WITH PROPOFOL N/A 12/01/2018   Procedure: COLONOSCOPY WITH PROPOFOL;  Surgeon: Lucilla Lame, MD;  Location[facility] ENDOSCOPY;  Service: Endoscopy;  Laterality: N/A;   DEBRIDEMENT AND CLOSURE WOUND Bilateral 10/02/2020   Procedure: Excision of bilateral breast wound;  Surgeon: Wallace Going, DO;  Location: [place];  Service: Plastics;  Laterality: Bilateral;  1.5 hours, please   FRACTURE SURGERY     ankle   IRRIGATION AND DEBRIDEMENT ABSCESS Right 03/12/2018   Procedure: IRRIGATION AND DEBRIDEMENT RIGHT AXILLARY ABSCESS;  Surgeon: Robert Bellow, MD;  Location: [facility];  Service: General;  Laterality: Right;   LATISSIMUS FLAP TO BREAST Right 02/07/2020   Procedure: LATISSIMUS MYOCUTANEOUS FLAP TO BREAST;  Surgeon: Wallace Going, DO;  Location: [place];  Service: Plastics;  Laterality: Right;  3 hours   MANDIBLE FRACTURE SURGERY     MASTECTOMY     MASTECTOMY MODIFIED RADICAL Right  07/10/2018   ypT2 ypN2a ER/PR positive, HER-2/neu negative  Surgeon: Robert Bellow, MD;  Location: [facility];  Service: General;  Laterality: Right;   PORT-A-CATH REMOVAL Left 02/07/2020   Procedure: REMOVAL PORT-A-CATH;  Surgeon: Wallace Going, DO;  Location: [place];  Service: Plastics;  Laterality: Left;   PORTACATH PLACEMENT Left 11/28/2017   Procedure: INSERTION PORT-A-CATH;  Surgeon: Robert Bellow, MD;  Location: [facility];  Service: General;  Laterality: Left;   REMOVAL OF TISSUE EXPANDER Right 06/05/2020   Procedure: TISSUE EXPANDER REMOVAL;  Surgeon: Wallace Going, DO;  Location: [facility];  Service: Plastics;  Laterality: Right;   SIMPLE MASTECTOMY WITH AXILLARY SENTINEL NODE BIOPSY Left 07/10/2018   High-grade DCIS SIMPLE MASTECTOMY;  Surgeon: Robert Bellow, MD;  Location: [facility];  Service: General;  Laterality: Left;   TISSUE EXPANDER PLACEMENT Left 06/05/2020   Procedure: TISSUE EXPANDER FLUID REMOVAL-100ML;  Surgeon: Wallace Going, DO;  Location: [facility];  Service: Plastics;  Laterality: Left;   TISSUE EXPANDER PLACEMENT Left 06/14/2020   Procedure: TISSUE EXPANDER REMOVAL;  Surgeon: Wallace Going, DO;  Location: [place];  Service: Plastics;  Laterality: Left;  45 min, please    Social History   Socioeconomic History   Marital status: Divorced    Spouse name: Not on file   Number of children: Not on file   Years of education:  Not on file   Highest education level: Not on file  Occupational History   Not on file  Tobacco Use   Smoking status: Former    Packs/day: 0.25    Years: 10.00    Pack years: 2.50    Types: Cigarettes    Quit date: 39    Years since quitting: 45.0   Smokeless tobacco: Never  Vaping Use   Vaping Use: Never used  Substance and Sexual Activity   Alcohol use: Yes    Alcohol/week: 1.0 standard drink    Types: 1 Glasses of wine per week    Comment: " occasional glass of wine "   Drug use: No   Sexual activity:  Not Currently    Birth control/protection: Post-menopausal  Other Topics Concern   Not on file  Social History Narrative   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.    Lives at home (son 84 yo lives with her)   Children 2   Pets: 3 small dogs and 1 frog    Caffeine- 1 cup of coffee in the morning, rare tea   Social Determinants of Health   Financial Resource Strain: Not on file  Food Insecurity: Not on file  Transportation Needs: Not on file  Physical Activity: Not on file  Stress: Not on file  Social Connections: Not on file  Intimate Partner Violence: Not on file    Family History  Problem Relation Age of Onset   Cancer Mother        Esophageal Cancer   Early death Mother        Age 72   Cancer Father        Lung Cancer   Diabetes Father    Diabetes Brother        Controlled with diet   Heart attack Paternal Grandfather    Colon cancer Neg Hx      Current Outpatient Medications:    b complex vitamins tablet, Take 1 tablet by mouth daily., Disp: , Rfl:    Calcium-Magnesium-Vitamin D (CALCIUM 1200+D3 PO), Take 1,200 mg by mouth daily. , Disp: , Rfl:    letrozole (FEMARA) 2.5 MG tablet, Take 1 tablet (2.5 mg total) by mouth daily., Disp: 90 tablet, Rfl: 1   loratadine (CLARITIN) 10 MG tablet, Take 10 mg by mouth every morning. , Disp: , Rfl:    Melatonin 5 MG CAPS, Take 5 mg by mouth daily as needed (sleep). , Disp: , Rfl:    Multiple Vitamin (MULTIVITAMIN WITH MINERALS) TABS tablet, Take 1 tablet by mouth daily. Centrum Silver, Disp: , Rfl:    Naphazoline HCl (CLEAR EYES OP), Place 1 drop into both eyes daily., Disp: , Rfl:    Probiotic Product (PROBIOTIC PO), Take 1 capsule by mouth daily., Disp: , Rfl:  No current facility-administered medications for this visit.  Facility-Administered Medications Ordered in Other Visits:    heparin lock flush 100 unit/mL, 500 Units, Intracatheter, PRN, Lequita Asal, MD  Physical exam:  Vitals:   11/23/21 1124  BP: 115/71   Pulse: 74  Resp: 16  Temp: (!) 97.5 F (36.4 C)  TempSrc: Tympanic  SpO2: 100%  Weight: 155 lb 9.6 oz (70.6 kg)   Physical Exam Constitutional:      General: She is not in acute distress. Cardiovascular:     Rate and Rhythm: Normal rate and regular rhythm.     Heart sounds: Normal heart sounds.  Pulmonary:     Effort: Pulmonary effort is  normal.     Breath sounds: Normal breath sounds.  Abdominal:     General: Bowel sounds are normal.     Palpations: Abdomen is soft.  Skin:    General: Skin is warm and dry.  Neurological:     Mental Status: She is alert and oriented to person, place, and time.     CMP Latest Ref Rng & Units 08/20/2021  Glucose 70 - 99 mg/dL 101(H)  BUN 8 - 23 mg/dL 12  Creatinine 0.44 - 1.00 mg/dL 0.85  Sodium 135 - 145 mmol/L 136  Potassium 3.5 - 5.1 mmol/L 3.8  Chloride 98 - 111 mmol/L 101  CO2 22 - 32 mmol/L 27  Calcium 8.9 - 10.3 mg/dL 9.5  Total Protein 6.5 - 8.1 g/dL 7.9  Total Bilirubin 0.3 - 1.2 mg/dL 0.7  Alkaline Phos 38 - 126 U/L 48  AST 15 - 41 U/L 26  ALT 0 - 44 U/L 18   CBC Latest Ref Rng & Units 09/25/2021  WBC 4.0 - 10.5 K/uL 4.5  Hemoglobin 12.0 - 15.0 g/dL 14.8  Hematocrit 36.0 - 46.0 % 44.2  Platelets 150.0 - 400.0 K/uL 237.0     Assessment and plan- Patient is a 66 y.o. female  with history of stage III right breast cancer T2 N3 M0 ER/PR positive HER2 negative and left breast DCIS s/p bilateral mastectomy currently on Femara.  This is a routine follow-up visit  Patient is tolerating letrozole well without any significant side effects.  She did have a high risk node positive breast cancer requiring chemotherapy and therefore ideally I would recommend that patient should stay on hormone therapy for 10 years ideally ending in February.  2030.  Patient does have baseline osteopenia which appears to be overall improving after starting bisphosphonates.  She will receive her next dose of Zometa in first week of April 2023.  I will see  her back in 6 months no labs   Visit Diagnosis 1. Encounter for follow-up surveillance of breast cancer   2. Use of letrozole (Femara)      Dr. Randa Evens, MD, MPH [facility] [phone] 11/25/2021 5:36 PM

## 2022-01-30 ENCOUNTER — Other Ambulatory Visit: Payer: Self-pay | Admitting: Oncology

## 2022-01-30 DIAGNOSIS — C50411 Malignant neoplasm of upper-outer quadrant of right female breast: Secondary | ICD-10-CM

## 2022-02-18 ENCOUNTER — Inpatient Hospital Stay: Payer: Medicare PPO

## 2022-02-18 ENCOUNTER — Inpatient Hospital Stay: Payer: Medicare PPO | Attending: Oncology

## 2022-02-18 VITALS — BP 135/75 | HR 75 | Temp 98.6°F | Resp 16

## 2022-02-18 DIAGNOSIS — Z79899 Other long term (current) drug therapy: Secondary | ICD-10-CM | POA: Diagnosis not present

## 2022-02-18 DIAGNOSIS — T451X5A Adverse effect of antineoplastic and immunosuppressive drugs, initial encounter: Secondary | ICD-10-CM

## 2022-02-18 DIAGNOSIS — C50911 Malignant neoplasm of unspecified site of right female breast: Secondary | ICD-10-CM

## 2022-02-18 DIAGNOSIS — M8580 Other specified disorders of bone density and structure, unspecified site: Secondary | ICD-10-CM | POA: Diagnosis not present

## 2022-02-18 DIAGNOSIS — Z08 Encounter for follow-up examination after completed treatment for malignant neoplasm: Secondary | ICD-10-CM

## 2022-02-18 LAB — BASIC METABOLIC PANEL
Anion gap: 9 (ref 5–15)
BUN: 15 mg/dL (ref 8–23)
CO2: 28 mmol/L (ref 22–32)
Calcium: 9.7 mg/dL (ref 8.9–10.3)
Chloride: 100 mmol/L (ref 98–111)
Creatinine, Ser: 1.12 mg/dL — ABNORMAL HIGH (ref 0.44–1.00)
GFR, Estimated: 55 mL/min — ABNORMAL LOW (ref >60)
Glucose, Bld: 96 mg/dL (ref 70–99)
Potassium: 3.8 mmol/L (ref 3.5–5.1)
Sodium: 137 mmol/L (ref 135–145)

## 2022-02-18 MED ORDER — SODIUM CHLORIDE 0.9 % IV SOLN
Freq: Once | INTRAVENOUS | Status: AC
  Filled 2022-02-18: qty 250

## 2022-02-18 MED ORDER — ZOLEDRONIC ACID 4 MG/100ML IV SOLN
4.0000 mg | Freq: Once | INTRAVENOUS | Status: AC
  Administered 2022-02-18: 4 mg via INTRAVENOUS
  Filled 2022-02-18: qty 100

## 2022-02-18 NOTE — Progress Notes (Signed)
Per Dr. Janese Banks, ok to proceed with Zometa with a creatinine of 1.12 and GFR of 55 today. ? ?

## 2022-02-22 ENCOUNTER — Encounter: Payer: Self-pay | Admitting: Hematology and Oncology

## 2022-02-22 ENCOUNTER — Encounter: Payer: Self-pay | Admitting: Oncology

## 2022-03-21 ENCOUNTER — Ambulatory Visit (INDEPENDENT_AMBULATORY_CARE_PROVIDER_SITE_OTHER): Payer: Medicare PPO

## 2022-03-21 VITALS — Ht 65.0 in | Wt 155.0 lb

## 2022-03-21 DIAGNOSIS — Z0000 Encounter for general adult medical examination without abnormal findings: Secondary | ICD-10-CM

## 2022-03-21 NOTE — Patient Instructions (Addendum)
?  Megan Vega , ?Thank you for taking time to come for your Medicare Wellness Visit. I appreciate your ongoing commitment to your health goals. Please review the following plan we discussed and let me know if I can assist you in the future.  ? ?These are the goals we discussed: ? Goals   ? ?  Maintain Healthy Lifestyle   ?  Stay active ?Healthy diet ?  ? ?  ?  ?This is a list of the screening recommended for you and due dates:  ?Health Maintenance  ?Topic Date Due  ? Pneumonia Vaccine (1 - PCV) Never done  ? COVID-19 Vaccine (4 - Booster for Pfizer series) 08/20/2021  ? Pap Smear  03/11/2022  ? Zoster (Shingles) Vaccine (1 of 2) 06/21/2022*  ? Flu Shot  06/18/2022  ? Colon Cancer Screening  12/02/2023  ? Tetanus Vaccine  09/08/2030  ? DEXA scan (bone density measurement)  Completed  ? Hepatitis C Screening: USPSTF Recommendation to screen - Ages 79-79 yo.  Completed  ? HIV Screening  Completed  ? HPV Vaccine  Aged Out  ? Mammogram  Discontinued  ?*Topic was postponed. The date shown is not the original due date.  ?  ?

## 2022-03-21 NOTE — Progress Notes (Signed)
Subjective:   Megan Vega is a 66 y.o. female who presents for an Initial Medicare Annual Wellness Visit.  Review of Systems    No ROS.  Medicare Wellness Virtual Visit.  Visual/audio telehealth visit, UTA vital signs.   See social history for additional risk factors.   Cardiac Risk Factors include: advanced age (>91men, >38 women)     Objective:    Today's Vitals   03/21/22 0834  Weight: 155 lb (70.3 kg)  Height: 5\' 5"  (1.651 m)   Body mass index is 25.79 kg/m.     03/21/2022    8:42 AM 11/23/2021   11:22 AM 12/27/2020    1:27 PM 10/02/2020    7:42 AM 09/22/2020    4:01 PM 06/26/2020    2:30 PM 06/14/2020    4:18 PM  Advanced Directives  Does Patient Have a Medical Advance Directive? No No No No No No No  Does patient want to make changes to medical advance directive?  No - Patient declined No - Patient declined      Would patient like information on creating a medical advance directive? No - Patient declined No - Patient declined No - Patient declined No - Patient declined No - Patient declined No - Patient declined No - Patient declined    Current Medications (verified) Outpatient Encounter Medications as of 03/21/2022  Medication Sig   b complex vitamins tablet Take 1 tablet by mouth daily.   Calcium-Magnesium-Vitamin D (CALCIUM 1200+D3 PO) Take 1,200 mg by mouth daily.    letrozole (FEMARA) 2.5 MG tablet TAKE 1 TABLET BY MOUTH EVERY DAY   loratadine (CLARITIN) 10 MG tablet Take 10 mg by mouth every morning.    Melatonin 5 MG CAPS Take 5 mg by mouth daily as needed (sleep).    Multiple Vitamin (MULTIVITAMIN WITH MINERALS) TABS tablet Take 1 tablet by mouth daily. Centrum Silver   Naphazoline HCl (CLEAR EYES OP) Place 1 drop into both eyes daily.   Probiotic Product (PROBIOTIC PO) Take 1 capsule by mouth daily.   Facility-Administered Encounter Medications as of 03/21/2022  Medication   heparin lock flush 100 unit/mL    Allergies (verified) Peanut-containing drug  products   History: Past Medical History:  Diagnosis Date   Ankle fracture    Breast cancer (HCC)    bilateral   Cancer (HCC)    Basal Cell Carcinoma, about 2011 chest   Cataract    Family history of adverse reaction to anesthesia    daughter with PONV   Hemorrhoids    History of methicillin resistant staphylococcus aureus (MRSA) 2015   Migraines    MIGRAINES occ   Personal history of chemotherapy    Personal history of radiation therapy    Wears glasses    Past Surgical History:  Procedure Laterality Date   BREAST BIOPSY Bilateral 10/29/2017   L breast DCIS, R breast invasive mammary carcinoma of no special type, ER/PR+, Her2/neu 1+   BREAST RECONSTRUCTION Right 02/07/2020   Procedure: BREAST RECONSTRUCTION;  Surgeon: Peggye Form, DO;  Location: [facility];  Service: Plastics;  Laterality: Right;   BREAST RECONSTRUCTION WITH PLACEMENT OF TISSUE EXPANDER AND FLEX HD (ACELLULAR HYDRATED DERMIS) Bilateral 08/30/2019   Procedure: BREAST RECONSTRUCTION WITH PLACEMENT OF TISSUE EXPANDER AND FLEX HD (ACELLULAR HYDRATED DERMIS);  Surgeon: Peggye Form, DO;  Location: [facility];  Service: Plastics;  Laterality: Bilateral;  2.5 hours, please   CESAREAN SECTION     COLONOSCOPY  2012   COLONOSCOPY WITH  PROPOFOL N/A 12/01/2018   Procedure: COLONOSCOPY WITH PROPOFOL;  Surgeon: Midge Minium, MD;  Location[facility] END[facility];  Service: Endoscopy;  Laterality: N/A;   DEBRIDEMENT AND CLOSURE WOUND Bilateral 10/02/2020   Procedure: Excision of bilateral breast wound;  Surgeon: Peggye Form, DO;  Location: [facility];  Service: Plastics;  Laterality: Bilateral;  1.5 hours, please   FRACTURE SURGERY     ankle   IRRIGATION AND DEBRIDEMENT ABSCESS Right 03/12/2018   Procedure: IRRIGATION AND DEBRIDEMENT RIGHT AXILLARY ABSCESS;  Surgeon: Earline Mayotte, MD;  Location: [facility];  Service: General;  Laterality: Right;   LATISSIMUS FLAP TO BREAST Right 02/07/2020    Procedure: LATISSIMUS MYOCUTANEOUS FLAP TO BREAST;  Surgeon: Peggye Form, DO;  Location: [facility];  Service: Plastics;  Laterality: Right;  3 hours   MANDIBLE FRACTURE SURGERY     MASTECTOMY     MASTECTOMY MODIFIED RADICAL Right 07/10/2018   ypT2 ypN2a ER/PR positive, HER-2/neu negative  Surgeon: Earline Mayotte, MD;  Location: [facility];  Service: General;  Laterality: Right;   PORT-A-CATH REMOVAL Left 02/07/2020   Procedure: REMOVAL PORT-A-CATH;  Surgeon: Peggye Form, DO;  Location: [facility];  Service: Plastics;  Laterality: Left;   PORTACATH PLACEMENT Left 11/28/2017   Procedure: INSERTION PORT-A-CATH;  Surgeon: Earline Mayotte, MD;  Location: [facility];  Service: General;  Laterality: Left;   REMOVAL OF TISSUE EXPANDER Right 06/05/2020   Procedure: TISSUE EXPANDER REMOVAL;  Surgeon: Peggye Form, DO;  Location: [facility];  Service: Plastics;  Laterality: Right;   SIMPLE MASTECTOMY WITH AXILLARY SENTINEL NODE BIOPSY Left 07/10/2018   High-grade DCIS SIMPLE MASTECTOMY;  Surgeon: Earline Mayotte, MD;  Location: [facility];  Service: General;  Laterality: Left;   TISSUE EXPANDER PLACEMENT Left 06/05/2020   Procedure: TISSUE EXPANDER FLUID REMOVAL-100ML;  Surgeon: Peggye Form, DO;  Location: [facility];  Service: Plastics;  Laterality: Left;   TISSUE EXPANDER PLACEMENT Left 06/14/2020   Procedure: TISSUE EXPANDER REMOVAL;  Surgeon: Peggye Form, DO;  Location: [facility];  Service: Plastics;  Laterality: Left;  45 min, please   Family History  Problem Relation Age of Onset   Cancer Mother        Esophageal Cancer   Early death Mother        Age 71   Cancer Father        Lung Cancer   Diabetes Father    Diabetes Brother        Controlled with diet   Heart attack Paternal Grandfather    Colon cancer Neg Hx    Social History   Socioeconomic History   Marital status: Divorced    Spouse name: Not on file   Number of children: Not on file   Years of  education: Not on file   Highest education level: Not on file  Occupational History   Not on file  Tobacco Use   Smoking status: Former    Packs/day: 0.25    Years: 10.00    Pack years: 2.50    Types: Cigarettes    Quit date: 1978    Years since quitting: 45.3   Smokeless tobacco: Never  Vaping Use   Vaping Use: Never used  Substance and Sexual Activity   Alcohol use: Yes    Alcohol/week: 1.0 standard drink    Types: 1 Glasses of wine per week    Comment: " occasional glass of wine "   Drug use: No  Sexual activity: Not Currently    Birth control/protection: Post-menopausal  Other Topics Concern   Not on file  Social History Narrative   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    Lives at home (son 10 yo lives with her)   Children 2   Pets: 3 small dogs and 1 frog    Caffeine- 1 cup of coffee in the morning, rare tea   Social Determinants of Health   Financial Resource Strain: Low Risk    Difficulty of Paying Living Expenses: Not hard at all  Food Insecurity: No Food Insecurity   Worried About Programme researcher, broadcasting/film/video in the Last Year: Never true   Ran Out of Food in the Last Year: Never true  Transportation Needs: No Transportation Needs   Lack of Transportation (Medical): No   Lack of Transportation (Non-Medical): No  Physical Activity: Sufficiently Active   Days of Exercise per Week: 7 days   Minutes of Exercise per Session: 60 min  Stress: No Stress Concern Present   Feeling of Stress : Not at all  Social Connections: Unknown   Frequency of Communication with Friends and Family: More than three times a week   Frequency of Social Gatherings with Friends and Family: More than three times a week   Attends Religious Services: Not on Scientist, clinical (histocompatibility and immunogenetics) or Organizations: Not on file   Attends Banker Meetings: Not on file   Marital Status: Not on file    Tobacco Counseling Counseling given: Not Answered   Clinical Intake:  Pre-visit  preparation completed: Yes        Diabetes: No  How often do you need to have someone help you when you read instructions, pamphlets, or other written materials from your doctor or pharmacy?: 1 - Never  Interpreter Needed?: No      Activities of Daily Living    03/21/2022    8:43 AM  In your present state of health, do you have any difficulty performing the following activities:  Hearing? 0  Vision? 0  Difficulty concentrating or making decisions? 0  Walking or climbing stairs? 0  Dressing or bathing? 0  Doing errands, shopping? 0  Preparing Food and eating ? N  Using the Toilet? N  In the past six months, have you accidently leaked urine? N  Do you have problems with loss of bowel control? N  Managing your Medications? N  Managing your Finances? N  Housekeeping or managing your Housekeeping? N    Patient Care Team: Allegra Grana, FNP as PCP - General (Family Medicine) Jim Like, RN as Oncology Nurse Navigator Byrnett, Merrily Pew, MD (General Surgery) Glori Luis, MD as Consulting Physician (Family Medicine) Lonell Face, MD as Consulting Physician (Neurology) Creig Hines, MD as Consulting Physician (Oncology)  Indicate any recent Medical Services you may have received from other than [facility] providers in the past year (date may be approximate).     Assessment:   This is a routine wellness examination for [place].  Virtual Visit via Telephone Note  I connected with  [name] on 03/21/22 at  8:15 AM EDT by telephone and verified that I am speaking with the correct person using two identifiers.  Persons participating in the virtual visit: patient/Nurse Health Advisor   I discussed the limitations of performing an evaluation and management service by telehealth. The patient expressed understanding and agreed to proceed. We continued and completed visit with audio only.  Some vital signs may be absent or patient reported.    Hearing/Vision screen Hearing Screening - Comments:: Patient is able to hear conversational tones without difficulty.  No issues reported. Vision Screening - Comments:: They have seen their ophthalmologist in the last 12 months.    Dietary issues and exercise activities discussed: Current Exercise Habits: Home exercise routine, Type of exercise: walking;treadmill, Time (Minutes): 60, Frequency (Times/Week): 7, Weekly Exercise (Minutes/Week): 420, Intensity: Mild Healthy diet  Good water intake   Goals Addressed             This Visit's Progress    Maintain Healthy Lifestyle       Stay active Healthy diet       Depression Screen    03/21/2022    8:39 AM 09/10/2021    3:07 PM 09/08/2020    3:39 PM 10/14/2018    3:24 PM 05/19/2018    9:48 AM 08/20/2017    9:41 AM 11/25/2016    4:51 PM  PHQ 2/9 Scores  PHQ - 2 Score 0 0 0 0 0 0 0  PHQ- 9 Score   2 0       Fall Risk    03/21/2022   10:27 AM 09/10/2021    3:07 PM 07/26/2020    2:48 PM 01/26/2020    3:11 PM 07/27/2019    1:58 PM  Fall Risk   Falls in the past year? 0 0 0 0 0  Number falls in past yr: 0 0 0 0   Injury with Fall?  0 0 0   Risk for fall due to :  No Fall Risks     Follow up Falls evaluation completed Falls evaluation completed       FALL RISK PREVENTION PERTAINING TO THE HOME: Home free of loose throw rugs in walkways, pet beds, electrical cords, etc? Yes  Adequate lighting in your home to reduce risk of falls? Yes   ASSISTIVE DEVICES UTILIZED TO PREVENT FALLS: Life alert? No  Use of a cane, walker or w/c? No   TIMED UP AND GO: Was the test performed? No .   Cognitive Function:  Patient is alert and oriented x3.       Immunizations Immunization History  Administered Date(s) Administered   Influenza,inj,Quad PF,6+ Mos 12/05/2017, 10/14/2018   PFIZER(Purple Top)SARS-COV-2 Vaccination 04/24/2020, 05/15/2020   Pfizer Covid-19 Vaccine Bivalent Booster 33yrs & up 06/25/2021   Tdap 11/20/2009,  09/08/2020   Pneumococcal vaccine status: Due, Education has been provided regarding the importance of this vaccine. Advised may receive this vaccine at local pharmacy or Health Dept. Aware to provide a copy of the vaccination record if obtained from local pharmacy or Health Dept. Verbalized acceptance and understanding.   Shingrix Completed?: No.    Education has been provided regarding the importance of this vaccine. Patient has been advised to call insurance company to determine out of pocket expense if they have not yet received this vaccine. Advised may also receive vaccine at local pharmacy or Health Dept. Verbalized acceptance and understanding.  Screening Tests Health Maintenance  Topic Date Due   Pneumonia Vaccine 71+ Years old (1 - PCV) Never done   COVID-19 Vaccine (4 - Booster for Pfizer series) 08/20/2021   PAP SMEAR-Modifier  03/11/2022   Zoster Vaccines- Shingrix (1 of 2) 06/21/2022 (Originally 04/14/1975)   INFLUENZA VACCINE  06/18/2022   COLONOSCOPY (Pts 45-18yrs Insurance coverage will need to be confirmed)  12/02/2023   TETANUS/TDAP  09/08/2030   DEXA SCAN  Completed   Hepatitis C Screening  Completed   HIV Screening  Completed   HPV VACCINES  Aged Out   MAMMOGRAM  Discontinued   Health Maintenance Health Maintenance Due  Topic Date Due   Pneumonia Vaccine 28+ Years old (1 - PCV) Never done   COVID-19 Vaccine (4 - Booster for Pfizer series) 08/20/2021   PAP SMEAR-Modifier  03/11/2022   Lung Cancer Screening: (Low Dose CT Chest recommended if Age 75-80 years, 30 pack-year currently smoking OR have quit w/in 15years.) does not qualify.   Vision Screening: Recommended annual ophthalmology exams for early detection of glaucoma and other disorders of the eye.  Dental Screening: Recommended annual dental exams for proper oral hygiene.  Community Resource Referral / Chronic Care Management: CRR required this visit?  No   CCM required this visit?  No      Plan:    Keep all routine maintenance appointments.   I have personally reviewed and noted the following in the patient's chart:   Medical and social history Use of alcohol, tobacco or illicit drugs  Current medications and supplements including opioid prescriptions. Patient is not currently taking opioid prescriptions. Functional ability and status Nutritional status Physical activity Advanced directives List of other physicians Hospitalizations, surgeries, and ER visits in previous 12 months Vitals Screenings to include cognitive, depression, and falls Referrals and appointments  In addition, I have reviewed and discussed with patient certain preventive protocols, quality metrics, and best practice recommendations. A written personalized care plan for preventive services as well as general preventive health recommendations were provided to patient.     Ashok Pall, LPN   0/02/5408

## 2022-05-27 ENCOUNTER — Encounter: Payer: Self-pay | Admitting: Oncology

## 2022-05-27 ENCOUNTER — Inpatient Hospital Stay: Payer: Medicare PPO | Attending: Oncology | Admitting: Oncology

## 2022-05-27 VITALS — BP 131/88 | HR 82 | Temp 97.4°F | Resp 16 | Wt 156.3 lb

## 2022-05-27 DIAGNOSIS — Z08 Encounter for follow-up examination after completed treatment for malignant neoplasm: Secondary | ICD-10-CM | POA: Diagnosis not present

## 2022-05-27 DIAGNOSIS — Z79811 Long term (current) use of aromatase inhibitors: Secondary | ICD-10-CM | POA: Insufficient documentation

## 2022-05-27 DIAGNOSIS — Z79899 Other long term (current) drug therapy: Secondary | ICD-10-CM | POA: Diagnosis not present

## 2022-05-27 DIAGNOSIS — Z853 Personal history of malignant neoplasm of breast: Secondary | ICD-10-CM | POA: Diagnosis not present

## 2022-05-27 DIAGNOSIS — Z5181 Encounter for therapeutic drug level monitoring: Secondary | ICD-10-CM

## 2022-05-27 DIAGNOSIS — Z170 Estrogen receptor positive status [ER+]: Secondary | ICD-10-CM | POA: Insufficient documentation

## 2022-05-27 DIAGNOSIS — Z923 Personal history of irradiation: Secondary | ICD-10-CM | POA: Diagnosis not present

## 2022-05-27 DIAGNOSIS — Z801 Family history of malignant neoplasm of trachea, bronchus and lung: Secondary | ICD-10-CM | POA: Diagnosis not present

## 2022-05-27 DIAGNOSIS — Z85828 Personal history of other malignant neoplasm of skin: Secondary | ICD-10-CM | POA: Insufficient documentation

## 2022-05-27 DIAGNOSIS — Z8614 Personal history of Methicillin resistant Staphylococcus aureus infection: Secondary | ICD-10-CM | POA: Diagnosis not present

## 2022-05-27 DIAGNOSIS — C50911 Malignant neoplasm of unspecified site of right female breast: Secondary | ICD-10-CM | POA: Diagnosis not present

## 2022-05-27 DIAGNOSIS — Z7983 Long term (current) use of bisphosphonates: Secondary | ICD-10-CM

## 2022-05-27 DIAGNOSIS — M545 Low back pain, unspecified: Secondary | ICD-10-CM | POA: Diagnosis not present

## 2022-05-27 DIAGNOSIS — Z9221 Personal history of antineoplastic chemotherapy: Secondary | ICD-10-CM | POA: Diagnosis not present

## 2022-05-27 NOTE — Progress Notes (Signed)
Hematology/Oncology Consult note [facility]  [phone] [phone]  Patient Care Team: Burnard Hawthorne, FNP as PCP - General (Family Medicine) Rico Junker, RN as Oncology Nurse Navigator Byrnett, Forest Gleason, MD (General Surgery) Leone Haven, MD as Consulting Physician (Family Medicine) Vladimir Crofts, MD as Consulting Physician (Neurology) Sindy Guadeloupe, MD as Consulting Physician (Oncology)   Name of the patient: Megan Vega  [id number]  1956-01-21   Date of visit: 05/27/22  Diagnosis- stage III right breast cancer and left breast DCIS in 2019  Chief complaint/ Reason for visit-routine follow-up of breast cancer  Heme/Onc history: Megan Vega is a 66 y.o. female with clinical stage T2N3bM0 right breast cancer and DCIS in the left breast s/p neoadjuvant chemotherapy followed by left simple mastectomy and right modified radical mastectomy on 07/10/2018.  Biopsy showed 1.4 cm grade 2 invasive mammary carcinoma ER greater than 90% positive PR greater than 90% positive and HER2 negative +1   Patient received neoadjuvant AC Taxol chemotherapy until July 2019.   Left breast pathology revealed 2.8 cm residual high grade DCIS, comedo type.  There was atypical lobular hyperplasia.  Four sentinel lymph nodes were negative.  Right breast pathology revealed 3.6 cm residual grade II invasive mammary carcinoma and high grade DCIS.  There was lymphovascular invasion.There was metastatic carcinoma in 2 sentinel lymph nodes.  Right axillary dissection revealed metastatic carcinoma in 7 lymph nodes.  There were a total of 9 lymph nodes with macrometastasis (largest 22 mm).  Pathologic stage was ypT2 ypN2a.   Patient then received adjuvant radiation treatment and started letrozole in February 2020   She underwent right breast reconstruction with latissimus myocutaneous flap and removal of her port-a-cath on 02/07/2020.  Expanders  were taken out in 05/2020 secondary to infection.  She underwent excision of left breast wound with closure on 10/02/2020. Pathology revealed inflamed granulation tissue and fibrosis. There was no evidence of malignancy.       Patient has baseline osteopenia and receives Zometa every 6 months  Interval history- She had a lumbar spine x-ray done at that time as well which showed lumbar spondylosispatient is doing well and denies any specific complaints at this time.  She has chronic back pain which is remained stable over the last 1 year.  ECOG PS- 0 Pain scale- 2   Review of systems- Review of Systems  Constitutional:  Negative for chills, fever, malaise/fatigue and weight loss.  HENT:  Negative for congestion, ear discharge and nosebleeds.   Eyes:  Negative for blurred vision.  Respiratory:  Negative for cough, hemoptysis, sputum production, shortness of breath and wheezing.   Cardiovascular:  Negative for chest pain, palpitations, orthopnea and claudication.  Gastrointestinal:  Negative for abdominal pain, blood in stool, constipation, diarrhea, heartburn, melena, nausea and vomiting.  Genitourinary:  Negative for dysuria, flank pain, frequency, hematuria and urgency.  Musculoskeletal:  Positive for back pain. Negative for joint pain and myalgias.  Skin:  Negative for rash.  Neurological:  Negative for dizziness, tingling, focal weakness, seizures, weakness and headaches.  Endo/Heme/Allergies:  Does not bruise/bleed easily.  Psychiatric/Behavioral:  Negative for depression and suicidal ideas. The patient does not have insomnia.       Allergies  Allergen Reactions   Peanut-Containing Drug Products Swelling and Other (See Comments)    Only when eating too many peanuts, swelling with lips and tingly face     Past Medical History:  Diagnosis Date   Ankle  fracture    Breast cancer ([place])    bilateral   Cancer ([place])    Basal Cell Carcinoma, about 2011 chest   Cataract    Family  history of adverse reaction to anesthesia    daughter with PONV   Hemorrhoids    History of methicillin resistant staphylococcus aureus (MRSA) 2015   Migraines    MIGRAINES occ   Personal history of chemotherapy    Personal history of radiation therapy    Wears glasses      Past Surgical History:  Procedure Laterality Date   BREAST BIOPSY Bilateral 10/29/2017   L breast DCIS, R breast invasive mammary carcinoma of no special type, ER/PR+, Her2/neu 1+   BREAST RECONSTRUCTION Right 02/07/2020   Procedure: BREAST RECONSTRUCTION;  Surgeon: Wallace Going, DO;  Location: [place];  Service: Plastics;  Laterality: Right;   BREAST RECONSTRUCTION WITH PLACEMENT OF TISSUE EXPANDER AND FLEX HD (ACELLULAR HYDRATED DERMIS) Bilateral 08/30/2019   Procedure: BREAST RECONSTRUCTION WITH PLACEMENT OF TISSUE EXPANDER AND FLEX HD (ACELLULAR HYDRATED DERMIS);  Surgeon: Wallace Going, DO;  Location: [facility];  Service: Plastics;  Laterality: Bilateral;  2.5 hours, please   CESAREAN SECTION     COLONOSCOPY  2012   COLONOSCOPY WITH PROPOFOL N/A 12/01/2018   Procedure: COLONOSCOPY WITH PROPOFOL;  Surgeon: Lucilla Lame, MD;  Location: [facility];  Service: Endoscopy;  Laterality: N/A;   DEBRIDEMENT AND CLOSURE WOUND Bilateral 10/02/2020   Procedure: Excision of bilateral breast wound;  Surgeon: Wallace Going, DO;  Location: [place];  Service: Plastics;  Laterality: Bilateral;  1.5 hours, please   FRACTURE SURGERY     ankle   IRRIGATION AND DEBRIDEMENT ABSCESS Right 03/12/2018   Procedure: IRRIGATION AND DEBRIDEMENT RIGHT AXILLARY ABSCESS;  Surgeon: Robert Bellow, MD;  Location: [facility];  Service: General;  Laterality: Right;   LATISSIMUS FLAP TO BREAST Right 02/07/2020   Procedure: LATISSIMUS MYOCUTANEOUS FLAP TO BREAST;  Surgeon: Wallace Going, DO;  Location: [place];  Service: Plastics;  Laterality: Right;  3 hours   MANDIBLE FRACTURE SURGERY     MASTECTOMY      MASTECTOMY MODIFIED RADICAL Right 07/10/2018   ypT2 ypN2a ER/PR positive, HER-2/neu negative  Surgeon: Robert Bellow, MD;  Location: [facility];  Service: General;  Laterality: Right;   PORT-A-CATH REMOVAL Left 02/07/2020   Procedure: REMOVAL PORT-A-CATH;  Surgeon: Wallace Going, DO;  Location: [place];  Service: Plastics;  Laterality: Left;   PORTACATH PLACEMENT Left 11/28/2017   Procedure: INSERTION PORT-A-CATH;  Surgeon: Robert Bellow, MD;  Location: [facility];  Service: General;  Laterality: Left;   REMOVAL OF TISSUE EXPANDER Right 06/05/2020   Procedure: TISSUE EXPANDER REMOVAL;  Surgeon: Wallace Going, DO;  Location: [facility];  Service: Plastics;  Laterality: Right;   SIMPLE MASTECTOMY WITH AXILLARY SENTINEL NODE BIOPSY Left 07/10/2018   High-grade DCIS SIMPLE MASTECTOMY;  Surgeon: Robert Bellow, MD;  Location: [facility];  Service: General;  Laterality: Left;   TISSUE EXPANDER PLACEMENT Left 06/05/2020   Procedure: TISSUE EXPANDER FLUID REMOVAL-100ML;  Surgeon: Wallace Going, DO;  Location: [facility];  Service: Plastics;  Laterality: Left;   TISSUE EXPANDER PLACEMENT Left 06/14/2020   Procedure: TISSUE EXPANDER REMOVAL;  Surgeon: Wallace Going, DO;  Location: [place];  Service: Plastics;  Laterality: Left;  45 min, please    Social History   Socioeconomic History   Marital status: Divorced    Spouse name: Not on file  Number of children: Not on file   Years of education: Not on file   Highest education level: Not on file  Occupational History   Not on file  Tobacco Use   Smoking status: Former    Packs/day: 0.25    Years: 10.00    Total pack years: 2.50    Types: Cigarettes    Quit date: 68    Years since quitting: 45.5   Smokeless tobacco: Never  Vaping Use   Vaping Use: Never used  Substance and Sexual Activity   Alcohol use: Yes    Alcohol/week: 1.0 standard drink of alcohol    Types: 1 Glasses of wine per week    Comment: "  occasional glass of wine "   Drug use: No   Sexual activity: Not Currently    Birth control/protection: Post-menopausal  Other Topics Concern   Not on file  Social History Narrative   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    Lives at home (son 38 yo lives with her)   Children 2   Pets: 3 small dogs and 1 frog    Caffeine- 1 cup of coffee in the morning, rare tea   Social Determinants of Health   Financial Resource Strain: Low Risk  (03/21/2022)   Overall Financial Resource Strain (CARDIA)    Difficulty of Paying Living Expenses: Not hard at all  Food Insecurity: No Food Insecurity (03/21/2022)   Hunger Vital Sign    Worried About Running Out of Food in the Last Year: Never true    [place] in the Last Year: Never true  Transportation Needs: No Transportation Needs (03/21/2022)   PRAPARE - Hydrologist (Medical): No    Lack of Transportation (Non-Medical): No  Physical Activity: Sufficiently Active (03/21/2022)   Exercise Vital Sign    Days of Exercise per Week: 7 days    Minutes of Exercise per Session: 60 min  Stress: No Stress Concern Present (03/21/2022)   [place]    Feeling of Stress : Not at all  Social Connections: Unknown (03/21/2022)   Social Connection and Isolation Panel [NHANES]    Frequency of Communication with Friends and Family: More than three times a week    Frequency of Social Gatherings with Friends and Family: More than three times a week    Attends Religious Services: Not on file    Active Member of [place] or Organizations: Not on file    Attends Archivist Meetings: Not on file    Marital Status: Not on file  Intimate Partner Violence: Not At Risk (03/21/2022)   Humiliation, Afraid, Rape, and Kick questionnaire    Fear of Current or Ex-Partner: No    Emotionally Abused: No    Physically Abused: No    Sexually Abused: No    Family History  Problem  Relation Age of Onset   Cancer Mother        Esophageal Cancer   Early death Mother        Age 66   Cancer Father        Lung Cancer   Diabetes Father    Diabetes Brother        Controlled with diet   Heart attack Paternal Grandfather    Colon cancer Neg Hx      Current Outpatient Medications:    b complex vitamins tablet, Take 1 tablet by mouth daily., Disp: ,  Rfl:    Calcium-Magnesium-Vitamin D (CALCIUM 1200+D3 PO), Take 1,200 mg by mouth daily. , Disp: , Rfl:    letrozole (FEMARA) 2.5 MG tablet, TAKE 1 TABLET BY MOUTH EVERY DAY, Disp: 90 tablet, Rfl: 1   loratadine (CLARITIN) 10 MG tablet, Take 10 mg by mouth every morning. , Disp: , Rfl:    Melatonin 5 MG CAPS, Take 5 mg by mouth daily as needed (sleep). , Disp: , Rfl:    Multiple Vitamin (MULTIVITAMIN WITH MINERALS) TABS tablet, Take 1 tablet by mouth daily. Centrum Silver, Disp: , Rfl:    Naphazoline HCl (CLEAR EYES OP), Place 1 drop into both eyes daily., Disp: , Rfl:    Probiotic Product (PROBIOTIC PO), Take 1 capsule by mouth daily., Disp: , Rfl:  No current facility-administered medications for this visit.  Facility-Administered Medications Ordered in Other Visits:    heparin lock flush 100 unit/mL, 500 Units, Intracatheter, PRN, Lequita Asal, MD  Physical exam:  Vitals:   05/27/22 1316  BP: 131/88  Pulse: 82  Resp: 16  Temp: (!) 97.4 F (36.3 C)  SpO2: 97%  Weight: 156 lb 4.8 oz (70.9 kg)   Physical Exam Constitutional:      General: She is not in acute distress. Cardiovascular:     Rate and Rhythm: Normal rate and regular rhythm.     Heart sounds: Normal heart sounds.  Pulmonary:     Effort: Pulmonary effort is normal.     Breath sounds: Normal breath sounds.  Abdominal:     General: Bowel sounds are normal.     Palpations: Abdomen is soft.  Skin:    General: Skin is warm and dry.  Neurological:     Mental Status: She is alert and oriented to person, place, and time.         Latest Ref  Rng & Units 02/18/2022    1:29 PM  CMP  Glucose 70 - 99 mg/dL 96   BUN 8 - 23 mg/dL 15   Creatinine 0.44 - 1.00 mg/dL 1.12   Sodium 135 - 145 mmol/L 137   Potassium 3.5 - 5.1 mmol/L 3.8   Chloride 98 - 111 mmol/L 100   CO2 22 - 32 mmol/L 28   Calcium 8.9 - 10.3 mg/dL 9.7       Latest Ref Rng & Units 09/25/2021    8:45 AM  CBC  WBC 4.0 - 10.5 K/uL 4.5   Hemoglobin 12.0 - 15.0 g/dL 14.8   Hematocrit 36.0 - 46.0 % 44.2   Platelets 150.0 - 400.0 K/uL 237.0     Assessment and plan- Patient is a 66 y.o. female  with history of stage III right breast cancer T2 N3 M0 ER/PR positive HER2 negative and left breast DCIS s/p bilateral mastectomy currently on Femara.  This is a routine follow-up visit for breast cancer  Clinically patient is doing well with no concerning signs and symptoms of recurrence based on today's exam.  She will continue with Femara at this time and I will see her back in 6 months.  Given that patient had high risk breast cancer warranting adjuvant chemotherapy she is currently on this since October 2020 .  She will receive her last dose of Zometa in October 2023.  Her baseline bone density does show osteopenia but her 10-year probability of a major osteoporotic risk fracture remains less than 20% and hip fracture is less than 3%.  She therefore does not require Zometa beyond 3 years at this  time  Low back pain: Chronic s.  We did discuss considering MRI spine if pain gets worse.  Patient does not think that her pain has changed much in the last 1 year.  Continue to monitor   Visit Diagnosis 1. Encounter for follow-up surveillance of breast cancer   2. Use of letrozole (Femara)   3. Encounter for monitoring zoledronic acid therapy      Dr. Randa Evens, MD, MPH [facility] [phone] 05/27/2022 1:06 PM

## 2022-07-24 ENCOUNTER — Other Ambulatory Visit: Payer: Self-pay | Admitting: Oncology

## 2022-07-24 DIAGNOSIS — C50411 Malignant neoplasm of upper-outer quadrant of right female breast: Secondary | ICD-10-CM

## 2022-08-02 ENCOUNTER — Encounter: Payer: Self-pay | Admitting: Plastic Surgery

## 2022-08-02 ENCOUNTER — Ambulatory Visit: Payer: Medicare PPO | Admitting: Plastic Surgery

## 2022-08-02 DIAGNOSIS — S21009D Unspecified open wound of unspecified breast, subsequent encounter: Secondary | ICD-10-CM

## 2022-08-02 DIAGNOSIS — Z170 Estrogen receptor positive status [ER+]: Secondary | ICD-10-CM | POA: Diagnosis not present

## 2022-08-02 DIAGNOSIS — Z08 Encounter for follow-up examination after completed treatment for malignant neoplasm: Secondary | ICD-10-CM | POA: Diagnosis not present

## 2022-08-02 DIAGNOSIS — Z853 Personal history of malignant neoplasm of breast: Secondary | ICD-10-CM

## 2022-08-02 DIAGNOSIS — Z923 Personal history of irradiation: Secondary | ICD-10-CM | POA: Diagnosis not present

## 2022-08-02 DIAGNOSIS — C50911 Malignant neoplasm of unspecified site of right female breast: Secondary | ICD-10-CM

## 2022-08-02 NOTE — Progress Notes (Signed)
   Subjective:    Patient ID: Megan Vega, female    DOB: [birth year], 66 y.o.   MRN: [id number]  The patient is a 66 year old female here for 1 year follow-up after breast reconstruction.  She underwent bilateral mastectomies with radiation for breast cancer.  She had reconstruction with expanders.  She had a right latissimus flap in March 2021.  She had some problems with her skin and ended up with removal of the expander in July 2021 on the right side.  A few weeks later she decided to go ahead and have the left expander removed as well.  She required some surgery for care of the wounds and healed well after that.  She has very nice latissimus flap on the right.  No sign of seromas on either side.  The skin is nice and soft with no sign of infection.  The patient is very pleased no areas of concern.  I did feel a tiny half a centimeter mass that was very soft and movable on the left lateral breast.  This would be something I pointed out to her that she can just follow but most likely it is just a cyst.  The patient is very pleased with her current state and is not wanting any further intervention.      Review of Systems  Constitutional: Negative.   HENT: Negative.    Eyes: Negative.   Respiratory: Negative.    Cardiovascular: Negative.   Gastrointestinal: Negative.   Endocrine: Negative.   Genitourinary: Negative.   Musculoskeletal: Negative.   Skin: Negative.        Objective:   Physical Exam Constitutional:      Appearance: Normal appearance.  HENT:     Head: Normocephalic and atraumatic.  Cardiovascular:     Rate and Rhythm: Normal rate.     Pulses: Normal pulses.  Pulmonary:     Effort: Pulmonary effort is normal.  Skin:    General: Skin is warm.     Coloration: Skin is not jaundiced.     Findings: No bruising.  Neurological:     Mental Status: She is alert and oriented to person, place, and time.  Psychiatric:        Mood and Affect: Mood normal.        Behavior:  Behavior normal.        Thought Content: Thought content normal.        Assessment & Plan:     ICD-10-CM   1. Breast wound, unspecified laterality, subsequent encounter  S21.009D     2. Bilateral malignant neoplasm of breast in female, estrogen receptor positive, unspecified site of breast ([place])  C50.911    Z17.0    C50.912       The patient is not currently seeing oncology so I will see her back in a year and will be able to further evaluate any changes.  She knows to call me if she has any questions or concerns.  Otherwise continue with light massage and follow-up in 1 year  Pictures were obtained of the patient and placed in the chart with the patient's or guardian's permission.

## 2022-09-03 ENCOUNTER — Inpatient Hospital Stay: Payer: Medicare PPO | Attending: Oncology

## 2022-09-03 ENCOUNTER — Inpatient Hospital Stay: Payer: Medicare PPO

## 2022-09-03 VITALS — BP 131/81 | HR 78 | Temp 98.7°F | Resp 20

## 2022-09-03 DIAGNOSIS — T451X5A Adverse effect of antineoplastic and immunosuppressive drugs, initial encounter: Secondary | ICD-10-CM

## 2022-09-03 DIAGNOSIS — Z08 Encounter for follow-up examination after completed treatment for malignant neoplasm: Secondary | ICD-10-CM

## 2022-09-03 DIAGNOSIS — C50911 Malignant neoplasm of unspecified site of right female breast: Secondary | ICD-10-CM | POA: Insufficient documentation

## 2022-09-03 DIAGNOSIS — Z79899 Other long term (current) drug therapy: Secondary | ICD-10-CM | POA: Insufficient documentation

## 2022-09-03 DIAGNOSIS — M8580 Other specified disorders of bone density and structure, unspecified site: Secondary | ICD-10-CM | POA: Insufficient documentation

## 2022-09-03 LAB — COMPREHENSIVE METABOLIC PANEL
ALT: 19 U/L (ref 0–44)
AST: 28 U/L (ref 15–41)
Albumin: 4.5 g/dL (ref 3.5–5.0)
Alkaline Phosphatase: 57 U/L (ref 38–126)
Anion gap: 9 (ref 5–15)
BUN: 15 mg/dL (ref 8–23)
CO2: 28 mmol/L (ref 22–32)
Calcium: 9.9 mg/dL (ref 8.9–10.3)
Chloride: 100 mmol/L (ref 98–111)
Creatinine, Ser: 0.9 mg/dL (ref 0.44–1.00)
GFR, Estimated: >60 mL/min (ref >60)
Glucose, Bld: 98 mg/dL (ref 70–99)
Potassium: 4.5 mmol/L (ref 3.5–5.1)
Sodium: 137 mmol/L (ref 135–145)
Total Bilirubin: 0.8 mg/dL (ref 0.3–1.2)
Total Protein: 7.9 g/dL (ref 6.5–8.1)

## 2022-09-03 MED ORDER — SODIUM CHLORIDE 0.9 % IV SOLN
Freq: Once | INTRAVENOUS | Status: AC
  Filled 2022-09-03: qty 250

## 2022-09-03 MED ORDER — ZOLEDRONIC ACID 4 MG/100ML IV SOLN
4.0000 mg | Freq: Once | INTRAVENOUS | Status: AC
  Administered 2022-09-03: 4 mg via INTRAVENOUS
  Filled 2022-09-03: qty 100

## 2022-11-27 ENCOUNTER — Inpatient Hospital Stay: Payer: Medicare PPO | Attending: Oncology | Admitting: Oncology

## 2022-11-27 ENCOUNTER — Encounter: Payer: Self-pay | Admitting: Oncology

## 2022-11-27 VITALS — BP 125/70 | HR 73 | Temp 97.9°F | Resp 16 | Ht 65.0 in | Wt 161.0 lb

## 2022-11-27 DIAGNOSIS — Z79811 Long term (current) use of aromatase inhibitors: Secondary | ICD-10-CM | POA: Diagnosis not present

## 2022-11-27 DIAGNOSIS — Z9013 Acquired absence of bilateral breasts and nipples: Secondary | ICD-10-CM | POA: Insufficient documentation

## 2022-11-27 DIAGNOSIS — Z170 Estrogen receptor positive status [ER+]: Secondary | ICD-10-CM | POA: Insufficient documentation

## 2022-11-27 DIAGNOSIS — C773 Secondary and unspecified malignant neoplasm of axilla and upper limb lymph nodes: Secondary | ICD-10-CM | POA: Diagnosis not present

## 2022-11-27 DIAGNOSIS — Z923 Personal history of irradiation: Secondary | ICD-10-CM | POA: Insufficient documentation

## 2022-11-27 DIAGNOSIS — Z87891 Personal history of nicotine dependence: Secondary | ICD-10-CM | POA: Diagnosis not present

## 2022-11-27 DIAGNOSIS — M545 Low back pain, unspecified: Secondary | ICD-10-CM | POA: Diagnosis not present

## 2022-11-27 DIAGNOSIS — C50911 Malignant neoplasm of unspecified site of right female breast: Secondary | ICD-10-CM

## 2022-11-27 DIAGNOSIS — Z85828 Personal history of other malignant neoplasm of skin: Secondary | ICD-10-CM | POA: Diagnosis not present

## 2022-11-27 DIAGNOSIS — Z9221 Personal history of antineoplastic chemotherapy: Secondary | ICD-10-CM | POA: Diagnosis not present

## 2022-11-27 DIAGNOSIS — C50912 Malignant neoplasm of unspecified site of left female breast: Secondary | ICD-10-CM

## 2022-11-28 ENCOUNTER — Encounter: Payer: Self-pay | Admitting: Hematology and Oncology

## 2022-11-28 ENCOUNTER — Encounter: Payer: Self-pay | Admitting: Oncology

## 2022-11-28 NOTE — Progress Notes (Signed)
Hematology/Oncology Consult note [facility]  [phone] [phone]  Patient Care Team: Burnard Hawthorne, FNP as PCP - General (Family Medicine) Rico Junker, RN as Oncology Nurse Navigator Byrnett, Forest Gleason, MD (General Surgery) Leone Haven, MD as Consulting Physician (Family Medicine) Vladimir Crofts, MD as Consulting Physician (Neurology) Sindy Guadeloupe, MD as Consulting Physician (Oncology)   Name of the patient: Megan Vega  [id number]  1955/12/21   Date of visit: 11/28/22  Diagnosis- stage III right breast cancer and left breast DCIS in 2019   Chief complaint/ Reason for visit-routine follow-up of breast cancer  Heme/Onc history: Megan Vega is a 67 y.o. female with clinical stage [id number] right breast cancer and DCIS in the left breast s/p neoadjuvant chemotherapy followed by left simple mastectomy and right modified radical mastectomy on 07/10/2018.  Biopsy showed 1.4 cm grade 2 invasive mammary carcinoma ER greater than 90% positive PR greater than 90% positive and HER2 negative +1   Patient received neoadjuvant AC Taxol chemotherapy until July 2019.   Left breast pathology revealed 2.8 cm residual high grade DCIS, comedo type.  There was atypical lobular hyperplasia.  Four sentinel lymph nodes were negative.  Right breast pathology revealed 3.6 cm residual grade II invasive mammary carcinoma and high grade DCIS.  There was lymphovascular invasion.There was metastatic carcinoma in 2 sentinel lymph nodes.  Right axillary dissection revealed metastatic carcinoma in 7 lymph nodes.  There were a total of 9 lymph nodes with macrometastasis (largest 22 mm).  Pathologic stage was ypT2 ypN2a.   Patient then received adjuvant radiation treatment and started letrozole in February 2020   She underwent right breast reconstruction with latissimus myocutaneous flap and removal of her port-a-cath on 02/07/2020.  Expanders  were taken out in 05/2020 secondary to infection.  She underwent excision of left breast wound with closure on 10/02/2020. Pathology revealed inflamed granulation tissue and fibrosis. There was no evidence of malignancy.  She received adjuvant Zometa for 3 years    Interval history-patient states that her low back pain comes and goes and has remained unchanged over the last year.  She is tolerating Femara without any significant side effects  ECOG PS- 0 Pain scale- 2   Review of systems- Review of Systems  Constitutional:  Negative for chills, fever, malaise/fatigue and weight loss.  HENT:  Negative for congestion, ear discharge and nosebleeds.   Eyes:  Negative for blurred vision.  Respiratory:  Negative for cough, hemoptysis, sputum production, shortness of breath and wheezing.   Cardiovascular:  Negative for chest pain, palpitations, orthopnea and claudication.  Gastrointestinal:  Negative for abdominal pain, blood in stool, constipation, diarrhea, heartburn, melena, nausea and vomiting.  Genitourinary:  Negative for dysuria, flank pain, frequency, hematuria and urgency.  Musculoskeletal:  Positive for back pain. Negative for joint pain and myalgias.  Skin:  Negative for rash.  Neurological:  Negative for dizziness, tingling, focal weakness, seizures, weakness and headaches.  Endo/Heme/Allergies:  Does not bruise/bleed easily.  Psychiatric/Behavioral:  Negative for depression and suicidal ideas. The patient does not have insomnia.       Allergies  Allergen Reactions   Peanut-Containing Drug Products Swelling and Other (See Comments)    Only when eating too many peanuts, swelling with lips and tingly face     Past Medical History:  Diagnosis Date   Ankle fracture    Breast cancer ([place])    bilateral   Cancer ([place])    Basal Cell  Carcinoma, about 2011 chest   Cataract    Family history of adverse reaction to anesthesia    daughter with PONV   Hemorrhoids    History of  methicillin resistant staphylococcus aureus (MRSA) 2015   Migraines    MIGRAINES occ   Personal history of chemotherapy    Personal history of radiation therapy    Wears glasses      Past Surgical History:  Procedure Laterality Date   BREAST BIOPSY Bilateral 10/29/2017   L breast DCIS, R breast invasive mammary carcinoma of no special type, ER/PR+, Her2/neu 1+   BREAST RECONSTRUCTION Right 02/07/2020   Procedure: BREAST RECONSTRUCTION;  Surgeon: Wallace Going, DO;  Location: [place];  Service: Plastics;  Laterality: Right;   BREAST RECONSTRUCTION WITH PLACEMENT OF TISSUE EXPANDER AND FLEX HD (ACELLULAR HYDRATED DERMIS) Bilateral 08/30/2019   Procedure: BREAST RECONSTRUCTION WITH PLACEMENT OF TISSUE EXPANDER AND FLEX HD (ACELLULAR HYDRATED DERMIS);  Surgeon: Wallace Going, DO;  Location: [facility];  Service: Plastics;  Laterality: Bilateral;  2.5 hours, please   CESAREAN SECTION     COLONOSCOPY  2012   COLONOSCOPY WITH PROPOFOL N/A 12/01/2018   Procedure: COLONOSCOPY WITH PROPOFOL;  Surgeon: Lucilla Lame, MD;  Location: [facility];  Service: Endoscopy;  Laterality: N/A;   DEBRIDEMENT AND CLOSURE WOUND Bilateral 10/02/2020   Procedure: Excision of bilateral breast wound;  Surgeon: Wallace Going, DO;  Location: [place];  Service: Plastics;  Laterality: Bilateral;  1.5 hours, please   FRACTURE SURGERY     ankle   IRRIGATION AND DEBRIDEMENT ABSCESS Right 03/12/2018   Procedure: IRRIGATION AND DEBRIDEMENT RIGHT AXILLARY ABSCESS;  Surgeon: Robert Bellow, MD;  Location: [facility];  Service: General;  Laterality: Right;   LATISSIMUS FLAP TO BREAST Right 02/07/2020   Procedure: LATISSIMUS MYOCUTANEOUS FLAP TO BREAST;  Surgeon: Wallace Going, DO;  Location: [place];  Service: Plastics;  Laterality: Right;  3 hours   MANDIBLE FRACTURE SURGERY     MASTECTOMY     MASTECTOMY MODIFIED RADICAL Right 07/10/2018   ypT2 ypN2a ER/PR positive, HER-2/neu negative   Surgeon: Robert Bellow, MD;  Location: [facility];  Service: General;  Laterality: Right;   PORT-A-CATH REMOVAL Left 02/07/2020   Procedure: REMOVAL PORT-A-CATH;  Surgeon: Wallace Going, DO;  Location: [place];  Service: Plastics;  Laterality: Left;   PORTACATH PLACEMENT Left 11/28/2017   Procedure: INSERTION PORT-A-CATH;  Surgeon: Robert Bellow, MD;  Location: [facility];  Service: General;  Laterality: Left;   REMOVAL OF TISSUE EXPANDER Right 06/05/2020   Procedure: TISSUE EXPANDER REMOVAL;  Surgeon: Wallace Going, DO;  Location: [facility];  Service: Plastics;  Laterality: Right;   SIMPLE MASTECTOMY WITH AXILLARY SENTINEL NODE BIOPSY Left 07/10/2018   High-grade DCIS SIMPLE MASTECTOMY;  Surgeon: Robert Bellow, MD;  Location: [facility];  Service: General;  Laterality: Left;   TISSUE EXPANDER PLACEMENT Left 06/05/2020   Procedure: TISSUE EXPANDER FLUID REMOVAL-100ML;  Surgeon: Wallace Going, DO;  Location: [facility];  Service: Plastics;  Laterality: Left;   TISSUE EXPANDER PLACEMENT Left 06/14/2020   Procedure: TISSUE EXPANDER REMOVAL;  Surgeon: Wallace Going, DO;  Location: [place];  Service: Plastics;  Laterality: Left;  45 min, please    Social History   Socioeconomic History   Marital status: Divorced    Spouse name: Not on file   Number of children: Not on file   Years of education: Not on file   Highest education  level: Not on file  Occupational History   Not on file  Tobacco Use   Smoking status: Former    Packs/day: 0.25    Years: 10.00    Total pack years: 2.50    Types: Cigarettes    Quit date: 61    Years since quitting: 46.0   Smokeless tobacco: Never  Vaping Use   Vaping Use: Never used  Substance and Sexual Activity   Alcohol use: Yes    Alcohol/week: 1.0 standard drink of alcohol    Types: 1 Glasses of wine per week    Comment: " occasional glass of wine "   Drug use: No   Sexual activity: Not Currently    Birth control/protection:  Post-menopausal  Other Topics Concern   Not on file  Social History Narrative   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    Lives at home (son 50 yo lives with her)   Children 2   Pets: 3 small dogs and 1 frog    Caffeine- 1 cup of coffee in the morning, rare tea   Social Determinants of Health   Financial Resource Strain: Low Risk  (03/21/2022)   Overall Financial Resource Strain (CARDIA)    Difficulty of Paying Living Expenses: Not hard at all  Food Insecurity: No Food Insecurity (03/21/2022)   Hunger Vital Sign    Worried About Running Out of Food in the Last Year: Never true    [place] in the Last Year: Never true  Transportation Needs: No Transportation Needs (03/21/2022)   PRAPARE - Hydrologist (Medical): No    Lack of Transportation (Non-Medical): No  Physical Activity: Sufficiently Active (03/21/2022)   Exercise Vital Sign    Days of Exercise per Week: 7 days    Minutes of Exercise per Session: 60 min  Stress: No Stress Concern Present (03/21/2022)   [place]    Feeling of Stress : Not at all  Social Connections: Unknown (03/21/2022)   Social Connection and Isolation Panel [NHANES]    Frequency of Communication with Friends and Family: More than three times a week    Frequency of Social Gatherings with Friends and Family: More than three times a week    Attends Religious Services: Not on file    Active Member of [place] or Organizations: Not on file    Attends Archivist Meetings: Not on file    Marital Status: Not on file  Intimate Partner Violence: Not At Risk (03/21/2022)   Humiliation, Afraid, Rape, and Kick questionnaire    Fear of Current or Ex-Partner: No    Emotionally Abused: No    Physically Abused: No    Sexually Abused: No    Family History  Problem Relation Age of Onset   Cancer Mother        Esophageal Cancer   Early death Mother        Age 32    Cancer Father        Lung Cancer   Diabetes Father    Diabetes Brother        Controlled with diet   Heart attack Paternal Grandfather    Colon cancer Neg Hx      Current Outpatient Medications:    b complex vitamins tablet, Take 1 tablet by mouth daily., Disp: , Rfl:    Calcium-Magnesium-Vitamin D (CALCIUM 1200+D3 PO), Take 1,200 mg by mouth daily. , Disp: ,  Rfl:    letrozole (FEMARA) 2.5 MG tablet, TAKE 1 TABLET BY MOUTH EVERY DAY, Disp: 90 tablet, Rfl: 1   loratadine (CLARITIN) 10 MG tablet, Take 10 mg by mouth every morning. , Disp: , Rfl:    Melatonin 5 MG CAPS, Take 5 mg by mouth daily as needed (sleep). , Disp: , Rfl:    Multiple Vitamin (MULTIVITAMIN WITH MINERALS) TABS tablet, Take 1 tablet by mouth daily. Centrum Silver, Disp: , Rfl:    Naphazoline HCl (CLEAR EYES OP), Place 1 drop into both eyes daily., Disp: , Rfl:    Probiotic Product (PROBIOTIC PO), Take 1 capsule by mouth daily., Disp: , Rfl:  No current facility-administered medications for this visit.  Facility-Administered Medications Ordered in Other Visits:    heparin lock flush 100 unit/mL, 500 Units, Intracatheter, PRN, Lequita Asal, MD  Physical exam:  Vitals:   11/27/22 1300  BP: 125/70  Pulse: 73  Resp: 16  Temp: 97.9 F (36.6 C)  TempSrc: Tympanic  SpO2: 98%  Weight: 161 lb (73 kg)  Height: '5\' 5"'$  (1.651 m)   Physical Exam HENT:     Head: Atraumatic.  Cardiovascular:     Rate and Rhythm: Normal rate and regular rhythm.     Heart sounds: Normal heart sounds.  Pulmonary:     Effort: Pulmonary effort is normal.     Breath sounds: Normal breath sounds.  Abdominal:     General: Bowel sounds are normal.     Palpations: Abdomen is soft.  Skin:    General: Skin is warm and dry.  Neurological:     Mental Status: She is alert and oriented to person, place, and time.   Chest wall exam: Patient is s/p bilateral mastectomy without reconstruction.  No evidence of chest wall recurrence.  No  palpable bilateral axillary adenopathy.     Latest Ref Rng & Units 09/03/2022    1:54 PM  CMP  Glucose 70 - 99 mg/dL 98   BUN 8 - 23 mg/dL 15   Creatinine 0.44 - 1.00 mg/dL 0.90   Sodium 135 - 145 mmol/L 137   Potassium 3.5 - 5.1 mmol/L 4.5   Chloride 98 - 111 mmol/L 100   CO2 22 - 32 mmol/L 28   Calcium 8.9 - 10.3 mg/dL 9.9   Total Protein 6.5 - 8.1 g/dL 7.9   Total Bilirubin 0.3 - 1.2 mg/dL 0.8   Alkaline Phos 38 - 126 U/L 57   AST 15 - 41 U/L 28   ALT 0 - 44 U/L 19       Latest Ref Rng & Units 09/25/2021    8:45 AM  CBC  WBC 4.0 - 10.5 K/uL 4.5   Hemoglobin 12.0 - 15.0 g/dL 14.8   Hematocrit 36.0 - 46.0 % 44.2   Platelets 150.0 - 400.0 K/uL 237.0      Assessment and plan- Patient is a 67 y.o. female with history of stage III right breast cancer T2 N3 M0 ER/PR positive HER2 negative and left breast DCIS s/p bilateral mastectomy currently on Femara.  She is here for routine follow-up of breast cancer  Patient will continue taking Femara at this time and given that she had stage IIIa right breast cancer I would like her to take that for 10 years ending in 2030.  Clinically she is doing well without any evidence of recurrence based on today's exam.  Low back pain: Suspect musculoskeletal etiology.  However given the history of breast cancer I  would like to get an MRI of her lumbar spine to rule out any metastatic disease.  Her prior x-rays have been normal.  Patient has completed 3 years of adjuvant Zometa.  I will see her back in 6 months with a bone density scan prior   Visit Diagnosis 1. Bilateral malignant neoplasm of breast in female, estrogen receptor positive, unspecified site of breast ([place])   2. Aromatase inhibitor use   3. Lumbar pain      Dr. Randa Evens, MD, MPH [facility] [phone] 11/28/2022 11:33 AM

## 2022-12-09 ENCOUNTER — Ambulatory Visit
Admission: RE | Admit: 2022-12-09 | Discharge: 2022-12-09 | Disposition: A | Discharge status: Home / Self Care | Payer: Medicare PPO | Source: Ambulatory Visit | Attending: Oncology | Admitting: Oncology

## 2022-12-09 DIAGNOSIS — C50912 Malignant neoplasm of unspecified site of left female breast: Secondary | ICD-10-CM | POA: Diagnosis not present

## 2022-12-09 DIAGNOSIS — Z170 Estrogen receptor positive status [ER+]: Secondary | ICD-10-CM | POA: Insufficient documentation

## 2022-12-09 DIAGNOSIS — C50911 Malignant neoplasm of unspecified site of right female breast: Secondary | ICD-10-CM | POA: Insufficient documentation

## 2022-12-09 DIAGNOSIS — M545 Low back pain, unspecified: Secondary | ICD-10-CM | POA: Insufficient documentation

## 2022-12-09 DIAGNOSIS — M48061 Spinal stenosis, lumbar region without neurogenic claudication: Secondary | ICD-10-CM | POA: Diagnosis not present

## 2022-12-09 MED ORDER — GADOBUTROL 1 MMOL/ML IV SOLN
7.0000 mL | Freq: Once | INTRAVENOUS | Status: AC | PRN
  Administered 2022-12-09: 7 mL via INTRAVENOUS

## 2022-12-12 ENCOUNTER — Other Ambulatory Visit: Payer: Self-pay | Admitting: Oncology

## 2022-12-12 DIAGNOSIS — R937 Abnormal findings on diagnostic imaging of other parts of musculoskeletal system: Secondary | ICD-10-CM

## 2022-12-13 ENCOUNTER — Ambulatory Visit
Admission: RE | Admit: 2022-12-13 | Discharge: 2022-12-13 | Disposition: A | Discharge status: Home / Self Care | Payer: Medicare PPO | Source: Ambulatory Visit | Attending: Oncology | Admitting: Oncology

## 2022-12-13 DIAGNOSIS — C7951 Secondary malignant neoplasm of bone: Secondary | ICD-10-CM | POA: Insufficient documentation

## 2022-12-13 DIAGNOSIS — Z853 Personal history of malignant neoplasm of breast: Secondary | ICD-10-CM | POA: Insufficient documentation

## 2022-12-13 DIAGNOSIS — Z9013 Acquired absence of bilateral breasts and nipples: Secondary | ICD-10-CM | POA: Diagnosis not present

## 2022-12-13 DIAGNOSIS — K8020 Calculus of gallbladder without cholecystitis without obstruction: Secondary | ICD-10-CM | POA: Insufficient documentation

## 2022-12-13 DIAGNOSIS — R590 Localized enlarged lymph nodes: Secondary | ICD-10-CM | POA: Diagnosis not present

## 2022-12-13 DIAGNOSIS — R937 Abnormal findings on diagnostic imaging of other parts of musculoskeletal system: Secondary | ICD-10-CM | POA: Diagnosis present

## 2022-12-13 LAB — GLUCOSE, CAPILLARY: Glucose-Capillary: 123 mg/dL — ABNORMAL HIGH (ref 70–99)

## 2022-12-13 MED ORDER — FLUDEOXYGLUCOSE F - 18 (FDG) INJECTION
9.0200 | Freq: Once | INTRAVENOUS | Status: AC | PRN
  Administered 2022-12-13: 9.02 via INTRAVENOUS

## 2022-12-16 ENCOUNTER — Other Ambulatory Visit: Payer: Self-pay | Admitting: *Deleted

## 2022-12-16 DIAGNOSIS — C50911 Malignant neoplasm of unspecified site of right female breast: Secondary | ICD-10-CM

## 2022-12-16 DIAGNOSIS — R937 Abnormal findings on diagnostic imaging of other parts of musculoskeletal system: Secondary | ICD-10-CM

## 2022-12-17 ENCOUNTER — Telehealth: Payer: Self-pay | Admitting: *Deleted

## 2022-12-17 NOTE — Telephone Encounter (Signed)
I called the patient and let her know that she has the bone biopsy on 2/8 to be there at 9:30 for 10:30. I told her it will be at heart and vascular area  in the front of there [facility]. NPO 8 hours prior and also need driver for sedation. She is ok  with the plan and knows the above  instructions.

## 2022-12-25 ENCOUNTER — Other Ambulatory Visit: Payer: Self-pay | Admitting: Internal Medicine

## 2022-12-25 DIAGNOSIS — M899 Disorder of bone, unspecified: Secondary | ICD-10-CM

## 2022-12-25 NOTE — Progress Notes (Signed)
Patient for IR L5 Vertebral Bone Biopsy on Thurs 12/26/2022, I called and spoke with the patient on the phone and gave pre-procedure instructions given. Pt was made aware to be here at 9:30a at the new entrance, NPO after MN prior to procedure as well as driver post procedure/recovery/discharge. Pt stated understanding.  Called 12/25/2022

## 2022-12-26 ENCOUNTER — Ambulatory Visit
Admission: RE | Admit: 2022-12-26 | Discharge: 2022-12-26 | Disposition: A | Discharge status: Home / Self Care | Payer: Medicare PPO | Source: Ambulatory Visit | Attending: Oncology | Admitting: Oncology

## 2022-12-26 ENCOUNTER — Encounter: Payer: Self-pay | Admitting: Radiology

## 2022-12-26 DIAGNOSIS — M899 Disorder of bone, unspecified: Secondary | ICD-10-CM | POA: Insufficient documentation

## 2022-12-26 DIAGNOSIS — C50912 Malignant neoplasm of unspecified site of left female breast: Secondary | ICD-10-CM | POA: Diagnosis not present

## 2022-12-26 DIAGNOSIS — Z170 Estrogen receptor positive status [ER+]: Secondary | ICD-10-CM | POA: Diagnosis not present

## 2022-12-26 DIAGNOSIS — R937 Abnormal findings on diagnostic imaging of other parts of musculoskeletal system: Secondary | ICD-10-CM | POA: Insufficient documentation

## 2022-12-26 DIAGNOSIS — M898X8 Other specified disorders of bone, other site: Secondary | ICD-10-CM | POA: Diagnosis not present

## 2022-12-26 DIAGNOSIS — C50911 Malignant neoplasm of unspecified site of right female breast: Secondary | ICD-10-CM | POA: Diagnosis not present

## 2022-12-26 HISTORY — PX: IR CT BONE TROCAR/NEEDLE BIOPSY DEEP: IMG941

## 2022-12-26 LAB — CBC WITH DIFFERENTIAL/PLATELET
Abs Immature Granulocytes: 0.01 10*3/uL (ref 0.00–0.07)
Basophils Absolute: 0.1 10*3/uL (ref 0.0–0.1)
Basophils Relative: 1 %
Eosinophils Absolute: 0.1 10*3/uL (ref 0.0–0.5)
Eosinophils Relative: 3 %
HCT: 43 % (ref 36.0–46.0)
Hemoglobin: 14.5 g/dL (ref 12.0–15.0)
Immature Granulocytes: 0 %
Lymphocytes Relative: 29 %
Lymphs Abs: 1.2 10*3/uL (ref 0.7–4.0)
MCH: 29 pg (ref 26.0–34.0)
MCHC: 33.7 g/dL (ref 30.0–36.0)
MCV: 86 fL (ref 80.0–100.0)
Monocytes Absolute: 0.4 10*3/uL (ref 0.1–1.0)
Monocytes Relative: 11 %
Neutro Abs: 2.2 10*3/uL (ref 1.7–7.7)
Neutrophils Relative %: 56 %
Platelets: 230 10*3/uL (ref 150–400)
RBC: 5 MIL/uL (ref 3.87–5.11)
RDW: 12.2 % (ref 11.5–15.5)
WBC: 4 10*3/uL (ref 4.0–10.5)
nRBC: 0 % (ref 0.0–0.2)

## 2022-12-26 LAB — PROTIME-INR
INR: 1 (ref 0.8–1.2)
Prothrombin Time: 12.9 seconds (ref 11.4–15.2)

## 2022-12-26 MED ORDER — SODIUM CHLORIDE 0.9 % IV SOLN: INTRAVENOUS | Status: DC

## 2022-12-26 MED ORDER — LIDOCAINE HCL (PF) 1 % IJ SOLN
INTRAMUSCULAR | Status: AC
  Administered 2022-12-26: 15 mL
  Filled 2022-12-26: qty 30

## 2022-12-26 MED ORDER — FENTANYL CITRATE (PF) 100 MCG/2ML IJ SOLN
INTRAMUSCULAR | Status: AC
  Filled 2022-12-26: qty 2

## 2022-12-26 MED ORDER — MIDAZOLAM HCL 2 MG/2ML IJ SOLN
INTRAMUSCULAR | Status: AC
  Filled 2022-12-26: qty 2

## 2022-12-26 MED ORDER — FENTANYL CITRATE (PF) 100 MCG/2ML IJ SOLN
INTRAMUSCULAR | Status: AC | PRN
  Administered 2022-12-26: 25 ug via INTRAVENOUS
  Administered 2022-12-26: 50 ug via INTRAVENOUS
  Administered 2022-12-26: 25 ug via INTRAVENOUS
  Administered 2022-12-26: 50 ug via INTRAVENOUS

## 2022-12-26 MED ORDER — MIDAZOLAM HCL 5 MG/5ML IJ SOLN
INTRAMUSCULAR | Status: AC | PRN
  Administered 2022-12-26: 1 mg via INTRAVENOUS
  Administered 2022-12-26 (×2): .5 mg via INTRAVENOUS

## 2022-12-26 NOTE — Procedures (Signed)
Interventional Radiology Procedure Note  Date of Procedure: 12/26/2022  Procedure: IR core biopsy of L5 lesion   Findings:  1. L5 vertebral body core biopsy x3 passes    Complications: No immediate complications noted.   Estimated Blood Loss: minimal  Follow-up and Recommendations: 1. Bedrest 2 hours    Albin Felling, MD  Vascular & Interventional Radiology  12/26/2022 12:38 PM

## 2022-12-26 NOTE — Progress Notes (Signed)
Patient clinically stable post L5 bone biopsy per Dr Denna Haggard, tolerated well. Vitals stable pre and post procedure. Received Versed 2 mg along with Fentanyl 150 mcg IV for procedure. Denies complaints at present time. Report given to Fransico Michael RN post procedure/331.

## 2022-12-26 NOTE — H&P (Addendum)
Chief Complaint: Patient was seen in consultation today for L5 lesion at the request of [place] [facility]  Referring Physician(s): Rao,Archana C  Supervising Physician: Juliet Rude  Patient Status: [facility] - Out-pt  History of Present Illness: Megan Vega is a 67 y.o. female with diagnosis of stage III right breast cancer and left breast DCIS in 2019.  She is s/p neoadjuvant chemotherapy followed by resection.  Reconstruction complicated by infection of expanders. As of late, she has been experiencing some dull intermittent back for approximately one year.  A MRI was performed revealing "1. 2.1 cm enhancing lesion in the L5 vertebral body, not clearly present on the prior PET-CT. Given history of breast cancer, this is concerning for metastatic disease".  Following a PET on 1/26 showed "1. The newly identified L5 lesion is hypermetabolic consistent with metastatic disease."  She presents today for requested L5 lesion bone biopsy.   Past Medical History:  Diagnosis Date   Ankle fracture    Breast cancer ([place])    bilateral   Cancer ([place])    Basal Cell Carcinoma, about 2011 chest   Cataract    Family history of adverse reaction to anesthesia    daughter with PONV   Hemorrhoids    History of methicillin resistant staphylococcus aureus (MRSA) 2015   Migraines    MIGRAINES occ   Personal history of chemotherapy    Personal history of radiation therapy    Wears glasses     Past Surgical History:  Procedure Laterality Date   BREAST BIOPSY Bilateral 10/29/2017   L breast DCIS, R breast invasive mammary carcinoma of no special type, ER/PR+, Her2/neu 1+   BREAST RECONSTRUCTION Right 02/07/2020   Procedure: BREAST RECONSTRUCTION;  Surgeon: Wallace Going, DO;  Location: [place];  Service: Plastics;  Laterality: Right;   BREAST RECONSTRUCTION WITH PLACEMENT OF TISSUE EXPANDER AND FLEX HD (ACELLULAR HYDRATED DERMIS) Bilateral 08/30/2019   Procedure: BREAST RECONSTRUCTION WITH  PLACEMENT OF TISSUE EXPANDER AND FLEX HD (ACELLULAR HYDRATED DERMIS);  Surgeon: Wallace Going, DO;  Location: [facility];  Service: Plastics;  Laterality: Bilateral;  2.5 hours, please   CESAREAN SECTION     COLONOSCOPY  2012   COLONOSCOPY WITH PROPOFOL N/A 12/01/2018   Procedure: COLONOSCOPY WITH PROPOFOL;  Surgeon: Lucilla Lame, MD;  Location: [facility];  Service: Endoscopy;  Laterality: N/A;   DEBRIDEMENT AND CLOSURE WOUND Bilateral 10/02/2020   Procedure: Excision of bilateral breast wound;  Surgeon: Wallace Going, DO;  Location: [place];  Service: Plastics;  Laterality: Bilateral;  1.5 hours, please   FRACTURE SURGERY     ankle   IRRIGATION AND DEBRIDEMENT ABSCESS Right 03/12/2018   Procedure: IRRIGATION AND DEBRIDEMENT RIGHT AXILLARY ABSCESS;  Surgeon: Robert Bellow, MD;  Location: [facility];  Service: General;  Laterality: Right;   LATISSIMUS FLAP TO BREAST Right 02/07/2020   Procedure: LATISSIMUS MYOCUTANEOUS FLAP TO BREAST;  Surgeon: Wallace Going, DO;  Location: [place];  Service: Plastics;  Laterality: Right;  3 hours   MANDIBLE FRACTURE SURGERY     MASTECTOMY     MASTECTOMY MODIFIED RADICAL Right 07/10/2018   ypT2 ypN2a ER/PR positive, HER-2/neu negative  Surgeon: Robert Bellow, MD;  Location: [facility];  Service: General;  Laterality: Right;   PORT-A-CATH REMOVAL Left 02/07/2020   Procedure: REMOVAL PORT-A-CATH;  Surgeon: Wallace Going, DO;  Location: [place];  Service: Plastics;  Laterality: Left;   PORTACATH PLACEMENT Left 11/28/2017   Procedure: INSERTION PORT-A-CATH;  Surgeon: Robert Bellow, MD;  Location: [facility];  Service: General;  Laterality: Left;   REMOVAL OF TISSUE EXPANDER Right 06/05/2020   Procedure: TISSUE EXPANDER REMOVAL;  Surgeon: Wallace Going, DO;  Location: [facility];  Service: Plastics;  Laterality: Right;   SIMPLE MASTECTOMY WITH AXILLARY SENTINEL NODE BIOPSY Left 07/10/2018   High-grade DCIS SIMPLE  MASTECTOMY;  Surgeon: Robert Bellow, MD;  Location: [facility];  Service: General;  Laterality: Left;   TISSUE EXPANDER PLACEMENT Left 06/05/2020   Procedure: TISSUE EXPANDER FLUID REMOVAL-100ML;  Surgeon: Wallace Going, DO;  Location: [facility];  Service: Plastics;  Laterality: Left;   TISSUE EXPANDER PLACEMENT Left 06/14/2020   Procedure: TISSUE EXPANDER REMOVAL;  Surgeon: Wallace Going, DO;  Location: [place];  Service: Plastics;  Laterality: Left;  45 min, please    Allergies: Peanut-containing drug products  Medications: Prior to Admission medications   Medication Sig Start Date End Date Taking? Authorizing Provider  b complex vitamins tablet Take 1 tablet by mouth daily.   Yes [provider]  Calcium-Magnesium-Vitamin D (CALCIUM 1200+D3 PO) Take 1,200 mg by mouth daily.    Yes [provider]  letrozole (FEMARA) 2.5 MG tablet TAKE 1 TABLET BY MOUTH EVERY DAY 07/24/22  Yes Sindy Guadeloupe, MD  loratadine (CLARITIN) 10 MG tablet Take 10 mg by mouth every morning.    Yes [provider]  Melatonin 5 MG CAPS Take 5 mg by mouth daily as needed (sleep).    Yes [provider]  Multiple Vitamin (MULTIVITAMIN WITH MINERALS) TABS tablet Take 1 tablet by mouth daily. Centrum Silver   Yes [provider]  Naphazoline HCl (CLEAR EYES OP) Place 1 drop into both eyes daily.   Yes [provider]  Probiotic Product (PROBIOTIC PO) Take 1 capsule by mouth daily.   Yes [provider]     Family History  Problem Relation Age of Onset   Cancer Mother        Esophageal Cancer   Early death Mother        Age 7   Cancer Father        Lung Cancer   Diabetes Father    Diabetes Brother        Controlled with diet   Heart attack Paternal Grandfather    Colon cancer Neg Hx     Social History   Socioeconomic History   Marital status: Divorced    Spouse name: Not on file   Number of children: Not on file   Years of  education: Not on file   Highest education level: Not on file  Occupational History   Not on file  Tobacco Use   Smoking status: Former    Packs/day: 0.25    Years: 10.00    Total pack years: 2.50    Types: Cigarettes    Quit date: 1978    Years since quitting: 46.1   Smokeless tobacco: Never  Vaping Use   Vaping Use: Never used  Substance and Sexual Activity   Alcohol use: Yes    Alcohol/week: 1.0 standard drink of alcohol    Types: 1 Glasses of wine per week    Comment: " occasional glass of wine "   Drug use: No   Sexual activity: Not Currently    Birth control/protection: Post-menopausal  Other Topics Concern   Not on file  Social History Narrative   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    Lives  at home (son 78 yo lives with her)   Children 2   Pets: 3 small dogs and 1 frog    Caffeine- 1 cup of coffee in the morning, rare tea   Social Determinants of Health   Financial Resource Strain: Low Risk  (03/21/2022)   Overall Financial Resource Strain (CARDIA)    Difficulty of Paying Living Expenses: Not hard at all  Food Insecurity: No Food Insecurity (03/21/2022)   Hunger Vital Sign    Worried About Running Out of Food in the Last Year: Never true    [place] in the Last Year: Never true  Transportation Needs: No Transportation Needs (03/21/2022)   PRAPARE - Hydrologist (Medical): No    Lack of Transportation (Non-Medical): No  Physical Activity: Sufficiently Active (03/21/2022)   Exercise Vital Sign    Days of Exercise per Week: 7 days    Minutes of Exercise per Session: 60 min  Stress: No Stress Concern Present (03/21/2022)   [place]    Feeling of Stress : Not at all  Social Connections: Unknown (03/21/2022)   Social Connection and Isolation Panel [NHANES]    Frequency of Communication with Friends and Family: More than three times a week    Frequency of Social  Gatherings with Friends and Family: More than three times a week    Attends Religious Services: Not on Advertising copywriter or Organizations: Not on file    Attends Archivist Meetings: Not on file    Marital Status: Not on file    Review of Systems  Constitutional: Negative.   HENT: Negative.    Eyes: Negative.   Respiratory: Negative.    Cardiovascular: Negative.   Gastrointestinal: Negative.   Endocrine: Negative.   Genitourinary: Negative.   Musculoskeletal:  Positive for back pain.  Allergic/Immunologic: Negative.   Neurological: Negative.   Hematological: Negative.   Psychiatric/Behavioral: Negative.      Vital Signs: BP 127/77   Pulse 83   Temp 97.7 F (36.5 C) (Oral)   Resp 15   Ht '5\' 5"'$  (1.651 m)   Wt 161 lb (73 kg)   LMP  (LMP Unknown)   SpO2 97%   BMI 26.79 kg/m   Physical Exam Constitutional:      Appearance: Normal appearance. She is not ill-appearing.  HENT:     Head: Normocephalic and atraumatic.     Mouth/Throat:     Mouth: Mucous membranes are moist.     Pharynx: Oropharynx is clear.  Eyes:     General: No scleral icterus.    Extraocular Movements: Extraocular movements intact.  Cardiovascular:     Rate and Rhythm: Normal rate and regular rhythm.     Pulses: Normal pulses.     Heart sounds: Normal heart sounds.  Pulmonary:     Effort: Pulmonary effort is normal. No respiratory distress.     Breath sounds: Normal breath sounds.  Abdominal:     General: Abdomen is flat.     Palpations: Abdomen is soft.  Skin:    General: Skin is warm and dry.     Capillary Refill: Capillary refill takes less than 2 seconds.  Neurological:     General: No focal deficit present.     Mental Status: She is alert and oriented to person, place, and time.  Psychiatric:        Mood and Affect: Mood  normal.        Behavior: Behavior normal.     Imaging: NM PET Image Initial (PI) Skull Base To Thigh  Result Date: 12/16/2022 CLINICAL  DATA:  Subsequent treatment strategy for bilateral breast cancer diagnosed in 2018 with chemotherapy and radiation therapy. Increasing back pain with L5 lesion on MRI. EXAM: NUCLEAR MEDICINE PET SKULL BASE TO THIGH TECHNIQUE: 9.02 mCi F-18 FDG was injected intravenously. Full-ring PET imaging was performed from the skull base to thigh after the radiotracer. CT data was obtained and used for attenuation correction and anatomic localization. Fasting blood glucose: 123 mg/dl COMPARISON:  Lumbar spine MRI 12/09/2022, chest CT 03/12/2018 and PET-CT 11/24/2017. FINDINGS: Mediastinal blood pool activity: SUV max 1.3 NECK: No hypermetabolic cervical lymph nodes are identified.Fairly symmetric activity within the lymphoid tissue of Waldeyer's ring is within physiologic limits and similar to previous study.No suspicious activity identified within the pharyngeal mucosal space. Incidental CT findings: none CHEST: There are no hypermetabolic mediastinal, hilar or axillary lymph nodes. No hypermetabolic pulmonary activity or suspicious nodularity. Incidental CT findings: Small reactive left axillary lymph nodes are noted with retained fatty hila (SUV max 1.5). Upper lobe pulmonary scarring bilaterally, likely related to prior radiation therapy. Postsurgical changes from previous bilateral mastectomy. ABDOMEN/PELVIS: There is no hypermetabolic activity within the liver, adrenal glands, spleen or pancreas. There is no hypermetabolic nodal activity in the abdomen or pelvis. Incidental CT findings: Large calcified gallstone again noted. SKELETON: The newly identified L5 lesion is hypermetabolic (SUV max 3.3). No other suspicious osseous lesions are identified. Incidental CT findings: Mild spondylosis. No evidence of pathologic fracture. IMPRESSION: 1. The newly identified L5 lesion is hypermetabolic consistent with metastatic disease. 2. No other evidence of metastatic disease. 3. Cholelithiasis. Electronically Signed   By: Richardean Sale M.D.   On: 12/16/2022 08:50   MR Lumbar Spine W Wo Contrast  Result Date: 12/10/2022 CLINICAL DATA:  Chronic low back pain for the past year. No radiculopathy. History of breast cancer in remission. EXAM: MRI LUMBAR SPINE WITHOUT AND WITH CONTRAST TECHNIQUE: Multiplanar and multiecho pulse sequences of the lumbar spine were obtained without and with intravenous contrast. CONTRAST:  81m GADAVIST GADOBUTROL 1 MMOL/ML IV SOLN COMPARISON:  Lumbar spine x-rays dated May 22, 2021. FINDINGS: Segmentation:  Standard. Alignment:  No significant listhesis. Vertebrae: 2.1 cm enhancing lesion in the L5 vertebral body, not clearly present on the prior PET-CT. No additional lesion. No fracture or evidence of discitis. Conus medullaris and cauda equina: Conus extends to the L1 level. Conus and cauda equina appear normal. Paraspinal and other soft tissues: Negative. Disc levels: T11-T12: Mild disc bulging and bilateral facet arthropathy. No stenosis. T12-L1: Small circumferential disc osteophyte complex. No stenosis. L1-L2: Mild disc bulging and left facet arthropathy. Mild left lateral recess stenosis. No spinal canal or neuroforaminal stenosis. L2-L3: Mild disc bulging and left facet arthropathy. Mild spinal canal stenosis. No neuroforaminal stenosis. L3-L4: Mild disc bulging with superimposed left paracentral disc protrusion and annular fissure. Mild bilateral facet arthropathy. Moderate spinal canal stenosis. Moderate left and mild right lateral recess stenosis. Mild left neuroforaminal stenosis. No right neuroforaminal stenosis. L4-L5: Mild disc bulging with superimposed right paracentral disc protrusion and annular fissure. Mild bilateral facet arthropathy. Moderate spinal canal stenosis. Moderate right and mild left lateral recess stenosis. No neuroforaminal stenosis. L5-S1: Small near circumferential disc osteophyte complex and mild bilateral facet arthropathy. No stenosis. IMPRESSION: 1. 2.1 cm enhancing lesion  in the L5 vertebral body, not clearly present on the prior PET-CT.  Given history of breast cancer, this is concerning for metastatic disease. 2. Multilevel lumbar spondylosis as described above. Moderate spinal canal stenosis at L3-L4 and L4-L5. Electronically Signed   By: Titus Dubin M.D.   On: 12/10/2022 09:16    Labs:  CBC: Recent Labs    12/26/22 0953  WBC 4.0  HGB 14.5  HCT 43.0  PLT 230    COAGS: Recent Labs    12/26/22 0953  INR 1.0    BMP: Recent Labs    02/18/22 1329 09/03/22 1354  NA 137 137  K 3.8 4.5  CL 100 100  CO2 28 28  GLUCOSE 96 98  BUN 15 15  CALCIUM 9.7 9.9  CREATININE 1.12* 0.90  GFRNONAA 55* >60    LIVER FUNCTION TESTS: Recent Labs    09/03/22 1354  BILITOT 0.8  AST 28  ALT 19  ALKPHOS 57  PROT 7.9  ALBUMIN 4.5    TUMOR MARKERS: No results for input(s): "AFPTM", "CEA", "CA199", "CHROMGRNA" in the last 8760 hours.  Assessment and Plan:  L5 lesion --in setting of breast cancer history, concerning for metastatic disease --presents for planned bone lesion biopsy --is appropriately NPO, not on thinners --accompanied by daughter who will drive her home today --Labs, imaging, history, and vital reviewed --OK to proceed with planned bone lesion biopsy at L5 with moderate sedation and anticipated discharge later today.  Risks and benefits of vertebral bone biopsy was discussed with the patient and/or patient's family including, but not limited to bleeding, infection, damage to adjacent structures or low yield requiring additional tests.  All of the questions were answered and there is agreement to proceed.  Consent signed and in chart.   Thank you for this interesting consult.  I greatly enjoyed meeting Megan Vega and look forward to participating in their care.  A copy of this report was sent to the requesting provider on this date.  Electronically Signed: Pasty Spillers, PA 12/26/2022, 10:47 AM   I spent a total of 30  Minutes  in face to face in clinical consultation, greater than 50% of which was counseling/coordinating care for bone lesion at L5

## 2022-12-30 LAB — SURGICAL PATHOLOGY

## 2022-12-31 ENCOUNTER — Encounter: Payer: Self-pay | Admitting: *Deleted

## 2022-12-31 ENCOUNTER — Other Ambulatory Visit: Payer: Self-pay | Admitting: *Deleted

## 2022-12-31 DIAGNOSIS — M545 Low back pain, unspecified: Secondary | ICD-10-CM

## 2022-12-31 DIAGNOSIS — C50911 Malignant neoplasm of unspecified site of right female breast: Secondary | ICD-10-CM

## 2023-01-03 ENCOUNTER — Inpatient Hospital Stay: Payer: Medicare PPO | Admitting: Oncology

## 2023-01-22 ENCOUNTER — Other Ambulatory Visit: Payer: Self-pay | Admitting: Oncology

## 2023-01-22 DIAGNOSIS — C50411 Malignant neoplasm of upper-outer quadrant of right female breast: Secondary | ICD-10-CM

## 2023-01-27 ENCOUNTER — Ambulatory Visit
Admission: RE | Admit: 2023-01-27 | Discharge: 2023-01-27 | Disposition: A | Discharge status: Home / Self Care | Payer: Medicare PPO | Source: Ambulatory Visit | Attending: Oncology | Admitting: Oncology

## 2023-01-27 DIAGNOSIS — C50912 Malignant neoplasm of unspecified site of left female breast: Secondary | ICD-10-CM | POA: Insufficient documentation

## 2023-01-27 DIAGNOSIS — Z170 Estrogen receptor positive status [ER+]: Secondary | ICD-10-CM | POA: Diagnosis not present

## 2023-01-27 DIAGNOSIS — Z79811 Long term (current) use of aromatase inhibitors: Secondary | ICD-10-CM | POA: Diagnosis not present

## 2023-01-27 DIAGNOSIS — M85832 Other specified disorders of bone density and structure, left forearm: Secondary | ICD-10-CM | POA: Diagnosis not present

## 2023-01-27 DIAGNOSIS — M85851 Other specified disorders of bone density and structure, right thigh: Secondary | ICD-10-CM | POA: Diagnosis not present

## 2023-01-27 DIAGNOSIS — C50911 Malignant neoplasm of unspecified site of right female breast: Secondary | ICD-10-CM | POA: Diagnosis not present

## 2023-03-24 ENCOUNTER — Telehealth: Payer: Self-pay | Admitting: Family

## 2023-03-24 NOTE — Telephone Encounter (Signed)
Contacted Megan Vega to schedule their annual wellness visit. Appointment made for 04/02/2023.  Megan Vega; Care Guide Ambulatory Clinical Support [place] [facility] Direct Dial: [phone]

## 2023-04-02 ENCOUNTER — Ambulatory Visit (INDEPENDENT_AMBULATORY_CARE_PROVIDER_SITE_OTHER): Payer: Medicare PPO

## 2023-04-02 DIAGNOSIS — Z0000 Encounter for general adult medical examination without abnormal findings: Secondary | ICD-10-CM | POA: Diagnosis not present

## 2023-04-02 NOTE — Patient Instructions (Signed)

## 2023-04-02 NOTE — Progress Notes (Addendum)
I connected with  Megan Vega on 04/02/23 by a audio enabled telemedicine application and verified that I am speaking with the correct person using two identifiers.  Patient Location: Home  Provider Location: Home Office  I discussed the limitations of evaluation and management by telemedicine. The patient expressed understanding and agreed to proceed.   Subjective:   Megan Vega is a 67 y.o. female who presents for Medicare Annual (Subsequent) preventive examination.  Review of Systems    Per HPI unless specifically indicated below.  Cardiac Risk Factors include: advanced age (>63men, >55 women)          Objective:       12/26/2022    2:00 PM 12/26/2022    1:45 PM 12/26/2022    1:30 PM  Vitals with BMI  Systolic 102 112 161  Diastolic 56 62 58    Today's Vitals   04/02/23 0841  PainSc: 2    There is no height or weight on file to calculate BMI.     04/02/2023    8:47 AM 11/27/2022    1:02 PM 05/27/2022    1:03 PM 03/21/2022    8:42 AM 11/23/2021   11:22 AM 12/27/2020    1:27 PM 10/02/2020    7:42 AM  Advanced Directives  Does Patient Have a Medical Advance Directive? No No No No No No No  Does patient want to make changes to medical advance directive?  No - Patient declined No - Patient declined  No - Patient declined No - Patient declined   Would patient like information on creating a medical advance directive? No - Patient declined  No - Patient declined No - Patient declined No - Patient declined No - Patient declined No - Patient declined    Current Medications (verified) Outpatient Encounter Medications as of 04/02/2023  Medication Sig   b complex vitamins tablet Take 1 tablet by mouth daily.   Calcium-Magnesium-Vitamin D (CALCIUM 1200+D3 PO) Take 1,200 mg by mouth daily.    letrozole (FEMARA) 2.5 MG tablet Take 1 tablet (2.5 mg total) by mouth daily.   loratadine (CLARITIN) 10 MG tablet Take 10 mg by mouth every morning.    Melatonin 5 MG CAPS Take 5 mg by  mouth daily as needed (sleep).    Multiple Vitamin (MULTIVITAMIN WITH MINERALS) TABS tablet Take 1 tablet by mouth daily. Centrum Silver   Naphazoline HCl (CLEAR EYES OP) Place 1 drop into both eyes daily.   Probiotic Product (PROBIOTIC PO) Take 1 capsule by mouth daily.   Facility-Administered Encounter Medications as of 04/02/2023  Medication   heparin lock flush 100 unit/mL    Allergies (verified) Peanut-containing drug products   History: Past Medical History:  Diagnosis Date   Ankle fracture    Breast cancer (HCC)    bilateral   Cancer (HCC)    Basal Cell Carcinoma, about 2011 chest   Cataract    Family history of adverse reaction to anesthesia    daughter with PONV   Hemorrhoids    History of methicillin resistant staphylococcus aureus (MRSA) 2015   Migraines    MIGRAINES occ   Personal history of chemotherapy    Personal history of radiation therapy    Wears glasses    Past Surgical History:  Procedure Laterality Date   BREAST BIOPSY Bilateral 10/29/2017   L breast DCIS, R breast invasive mammary carcinoma of no special type, ER/PR+, Her2/neu 1+   BREAST RECONSTRUCTION Right 02/07/2020   Procedure: BREAST RECONSTRUCTION;  Surgeon: Peggye Form, DO;  Location: [facility];  Service: Plastics;  Laterality: Right;   BREAST RECONSTRUCTION WITH PLACEMENT OF TISSUE EXPANDER AND FLEX HD (ACELLULAR HYDRATED DERMIS) Bilateral 08/30/2019   Procedure: BREAST RECONSTRUCTION WITH PLACEMENT OF TISSUE EXPANDER AND FLEX HD (ACELLULAR HYDRATED DERMIS);  Surgeon: Peggye Form, DO;  Location: [facility];  Service: Plastics;  Laterality: Bilateral;  2.5 hours, please   CESAREAN SECTION     COLONOSCOPY  2012   COLONOSCOPY WITH PROPOFOL N/A 12/01/2018   Procedure: COLONOSCOPY WITH PROPOFOL;  Surgeon: Midge Minium, MD;  Location: [facility];  Service: Endoscopy;  Laterality: N/A;   DEBRIDEMENT AND CLOSURE WOUND Bilateral 10/02/2020   Procedure: Excision of bilateral breast  wound;  Surgeon: Peggye Form, DO;  Location: [facility];  Service: Plastics;  Laterality: Bilateral;  1.5 hours, please   FRACTURE SURGERY     ankle   IR CT BONE TROCAR/NEEDLE BIOPSY DEEP  12/26/2022   IRRIGATION AND DEBRIDEMENT ABSCESS Right 03/12/2018   Procedure: IRRIGATION AND DEBRIDEMENT RIGHT AXILLARY ABSCESS;  Surgeon: Earline Mayotte, MD;  Location: [facility];  Service: General;  Laterality: Right;   LATISSIMUS FLAP TO BREAST Right 02/07/2020   Procedure: LATISSIMUS MYOCUTANEOUS FLAP TO BREAST;  Surgeon: Peggye Form, DO;  Location: [facility];  Service: Plastics;  Laterality: Right;  3 hours   MANDIBLE FRACTURE SURGERY     MASTECTOMY     MASTECTOMY MODIFIED RADICAL Right 07/10/2018   ypT2 ypN2a ER/PR positive, HER-2/neu negative  Surgeon: Earline Mayotte, MD;  Location: [facility];  Service: General;  Laterality: Right;   PORT-A-CATH REMOVAL Left 02/07/2020   Procedure: REMOVAL PORT-A-CATH;  Surgeon: Peggye Form, DO;  Location: [facility];  Service: Plastics;  Laterality: Left;   PORTACATH PLACEMENT Left 11/28/2017   Procedure: INSERTION PORT-A-CATH;  Surgeon: Earline Mayotte, MD;  Location: [facility];  Service: General;  Laterality: Left;   REMOVAL OF TISSUE EXPANDER Right 06/05/2020   Procedure: TISSUE EXPANDER REMOVAL;  Surgeon: Peggye Form, DO;  Location: [facility];  Service: Plastics;  Laterality: Right;   SIMPLE MASTECTOMY WITH AXILLARY SENTINEL NODE BIOPSY Left 07/10/2018   High-grade DCIS SIMPLE MASTECTOMY;  Surgeon: Earline Mayotte, MD;  Location: [facility];  Service: General;  Laterality: Left;   TISSUE EXPANDER PLACEMENT Left 06/05/2020   Procedure: TISSUE EXPANDER FLUID REMOVAL-100ML;  Surgeon: Peggye Form, DO;  Location: [facility];  Service: Plastics;  Laterality: Left;   TISSUE EXPANDER PLACEMENT Left 06/14/2020   Procedure: TISSUE EXPANDER REMOVAL;  Surgeon: Peggye Form, DO;  Location: [facility];  Service: Plastics;   Laterality: Left;  45 min, please   Family History  Problem Relation Age of Onset   Cancer Mother        Esophageal Cancer   Early death Mother        Age 66   Cancer Father        Lung Cancer   Diabetes Father    Diabetes Brother        Controlled with diet   Heart attack Paternal Grandfather    Colon cancer Neg Hx    Social History   Socioeconomic History   Marital status: Divorced    Spouse name: Not on file   Number of children: 2   Years of education: Not on file   Highest education level: Not on file  Occupational History   Occupation: Retired  Tobacco Use   Smoking status: Former  Packs/day: 0.25    Years: 10.00    Additional pack years: 0.00    Total pack years: 2.50    Types: Cigarettes    Quit date: 66    Years since quitting: 46.4   Smokeless tobacco: Never  Vaping Use   Vaping Use: Never used  Substance and Sexual Activity   Alcohol use: Yes    Alcohol/week: 1.0 standard drink of alcohol    Types: 1 Glasses of wine per week    Comment: " occasional glass of wine "   Drug use: No   Sexual activity: Not Currently    Birth control/protection: Post-menopausal  Other Topics Concern   Not on file  Social History Narrative   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    Lives at home (son 32 yo lives with her)   Children 2   Pets: 3 small dogs and 1 frog    Caffeine- 1 cup of coffee in the morning, rare tea   Social Determinants of Health   Financial Resource Strain: Low Risk  (04/02/2023)   Overall Financial Resource Strain (CARDIA)    Difficulty of Paying Living Expenses: Not hard at all  Food Insecurity: No Food Insecurity (04/02/2023)   Hunger Vital Sign    Worried About Running Out of Food in the Last Year: Never true    Ran Out of Food in the Last Year: Never true  Transportation Needs: No Transportation Needs (04/02/2023)   PRAPARE - Administrator, Civil Service (Medical): No    Lack of Transportation (Non-Medical): No  Physical  Activity: Sufficiently Active (04/02/2023)   Exercise Vital Sign    Days of Exercise per Week: 7 days    Minutes of Exercise per Session: 30 min  Stress: No Stress Concern Present (04/02/2023)   [employer] of Occupational Health - Occupational Stress Questionnaire    Feeling of Stress : Not at all  Social Connections: Moderately Isolated (04/02/2023)   Social Connection and Isolation Panel [NHANES]    Frequency of Communication with Friends and Family: More than three times a week    Frequency of Social Gatherings with Friends and Family: Once a week    Attends Religious Services: More than 4 times per year    Active Member of [employer] or Organizations: No    Attends Engineer, structural: Never    Marital Status: Divorced    Tobacco Counseling Counseling given: No   Clinical Intake:  Pre-visit preparation completed: No  Pain : 0-10 Pain Score: 2  Pain Type: Chronic pain Pain Location: Back Pain Orientation: Lower Pain Descriptors / Indicators: Aching Pain Onset: More than a month ago Pain Frequency: Constant     Nutritional Status: BMI of 19-24  Normal Nutritional Risks: None Diabetes: No  How often do you need to have someone help you when you read instructions, pamphlets, or other written materials from your doctor or pharmacy?: 1 - Never  Diabetic? No   Interpreter Needed?: No  Information entered by :: Laurel Dimmer, CMA   Activities of Daily Living    04/02/2023    8:39 AM 03/29/2023    8:45 AM  In your present state of health, do you have any difficulty performing the following activities:  Hearing? 1 1  Comment Rt ear, amplifier   Vision? 1 0  Comment Wear glasses   Difficulty concentrating or making decisions? 0 0  Walking or climbing stairs? 0 0  Dressing or bathing? 0 0  Doing errands, shopping? 0 0  Preparing Food and eating ?  N  Using the Toilet?  N  In the past six months, have you accidently leaked urine?  N  Do you have  problems with loss of bowel control?  N  Managing your Medications?  N  Managing your Finances?  N  Housekeeping or managing your Housekeeping?  N    Patient Care Team: Allegra Grana, FNP as PCP - General (Family Medicine) Jim Like, RN as Oncology Nurse Navigator Byrnett, Merrily Pew, MD (General Surgery) Glori Luis, MD as Consulting Physician (Family Medicine) Lonell Face, MD as Consulting Physician (Neurology) Creig Hines, MD as Consulting Physician (Oncology)  Indicate any recent Medical Services you may have received from other than [facility] providers in the past year (date may be approximate).     Assessment:   This is a routine wellness examination for Megan Vega.  Hearing/Vision screen Positive hearing loss Rt ear since Cancer treatment. Wear an amplifier.  Denies any vision changes. Overdue for a Annual Eye Exam    Dietary issues and exercise activities discussed: Current Exercise Habits: Structured exercise class, Type of exercise: stretching;treadmill, Time (Minutes): 30, Frequency (Times/Week): 7, Weekly Exercise (Minutes/Week): 210, Intensity: Moderate, Exercise limited by: None identified   Goals Addressed   None    Depression Screen    04/02/2023    8:38 AM 03/21/2022    8:39 AM 09/10/2021    3:07 PM 09/08/2020    3:39 PM 10/14/2018    3:24 PM 05/19/2018    9:48 AM 08/20/2017    9:41 AM  PHQ 2/9 Scores  PHQ - 2 Score 0 0 0 0 0 0 0  PHQ- 9 Score    2 0      Fall Risk    04/02/2023    8:39 AM 03/29/2023    8:45 AM 03/21/2022   10:27 AM 09/10/2021    3:07 PM 07/26/2020    2:48 PM  Fall Risk   Falls in the past year? 0 0 0 0 0  Number falls in past yr: 0 0 0 0 0  Injury with Fall? 0 0  0 0  Risk for fall due to : No Fall Risks   No Fall Risks   Follow up Falls evaluation completed  Falls evaluation completed Falls evaluation completed     FALL RISK PREVENTION PERTAINING TO THE HOME:  Any stairs in or around the home? No  If so, are  there any without handrails? No  Home free of loose throw rugs in walkways, pet beds, electrical cords, etc? Yes  Adequate lighting in your home to reduce risk of falls? Yes   ASSISTIVE DEVICES UTILIZED TO PREVENT FALLS:  Life alert? No  Use of a cane, walker or w/c? No  Grab bars in the bathroom? No  Shower chair or bench in shower? No  Elevated toilet seat or a handicapped toilet? No   TIMED UP AND GO:  Was the test performed?Unable to perform, virtual appointment   Cognitive Function:        04/02/2023    8:44 AM  6CIT Screen  What Year? 0 points  What month? 0 points  What time? 0 points  Count back from 20 0 points  Months in reverse 0 points  Repeat phrase 2 points  Total Score 2 points    Immunizations Immunization History  Administered Date(s) Administered   Influenza,inj,Quad PF,6+ Mos 12/05/2017, 10/14/2018   PFIZER(Purple Top)SARS-COV-2  Vaccination 04/24/2020, 05/15/2020   Pfizer Covid-19 Vaccine Bivalent Booster 6yrs & up 06/25/2021   Tdap 11/20/2009, 09/08/2020    TDAP status: Up to date  Flu Vaccine status: Up to date  Pneumococcal vaccine status: Due, Education has been provided regarding the importance of this vaccine. Advised may receive this vaccine at local pharmacy or Health Dept. Aware to provide a copy of the vaccination record if obtained from local pharmacy or Health Dept. Verbalized acceptance and understanding.  Covid-19 vaccine status: Information provided on how to obtain vaccines.   Qualifies for Shingles Vaccine? Yes   Zostavax completed No   Shingrix Completed?: No.    Education has been provided regarding the importance of this vaccine. Patient has been advised to call insurance company to determine out of pocket expense if they have not yet received this vaccine. Advised may also receive vaccine at local pharmacy or Health Dept. Verbalized acceptance and understanding.  Screening Tests Health Maintenance  Topic Date Due    Pneumonia Vaccine 53+ Years old (1 of 2 - PCV) Never done   Zoster Vaccines- Shingrix (1 of 2) Never done   PAP SMEAR-Modifier  03/11/2022   COVID-19 Vaccine (4 - 2023-24 season) 07/19/2022   INFLUENZA VACCINE  06/19/2023   COLONOSCOPY (Pts 45-27yrs Insurance coverage will need to be confirmed)  12/02/2023   Medicare Annual Wellness (AWV)  04/01/2024   DTaP/Tdap/Td (3 - Td or Tdap) 09/08/2030   DEXA SCAN  Completed   Hepatitis C Screening  Completed   HPV VACCINES  Aged Out   MAMMOGRAM  Discontinued    Health Maintenance  Health Maintenance Due  Topic Date Due   Pneumonia Vaccine 89+ Years old (1 of 2 - PCV) Never done   Zoster Vaccines- Shingrix (1 of 2) Never done   PAP SMEAR-Modifier  03/11/2022   COVID-19 Vaccine (4 - 2023-24 season) 07/19/2022    Colorectal cancer screening: Type of screening: Colonoscopy. Completed 12/01/2018. Repeat every 5 years  Mammogram status: No longer required due to  Double mastectomy and no longer requires mammogram. .  DEXA Scan: completed 01/27/2023  Lung Cancer Screening: (Low Dose CT Chest recommended if Age 12-80 years, 30 pack-year currently smoking OR have quit w/in 15years.) does not qualify.   Lung Cancer Screening Referral: not applicable   Additional Screening:  Hepatitis C Screening: does qualify; Completed 08/21/2017  Vision Screening: Recommended annual ophthalmology exams for early detection of glaucoma and other disorders of the eye. Is the patient up to date with their annual eye exam?  No Who is the provider or what is the name of the office in which the patient attends annual eye exams?  If pt is not established with a provider, would they like to be referred to a provider to establish care? No .   Dental Screening: Recommended annual dental exams for proper oral hygiene  Community Resource Referral / Chronic Care Management: CRR required this visit?  No   CCM required this visit?  No      Plan:     I have  personally reviewed and noted the following in the patient's chart:   Medical and social history Use of alcohol, tobacco or illicit drugs  Current medications and supplements including opioid prescriptions. Patient is not currently taking opioid prescriptions. Functional ability and status Nutritional status Physical activity Advanced directives List of other physicians Hospitalizations, surgeries, and ER visits in previous 12 months Vitals Screenings to include cognitive, depression, and falls Referrals and appointments  In  addition, I have reviewed and discussed with patient certain preventive protocols, quality metrics, and best practice recommendations. A written personalized care plan for preventive services as well as general preventive health recommendations were provided to patient.    Megan Vega , Thank you for taking time to come for your Medicare Wellness Visit. I appreciate your ongoing commitment to your health goals. Please review the following plan we discussed and let me know if I can assist you in the future.   These are the goals we discussed:  Goals      Maintain Healthy Lifestyle     Stay active Healthy diet        This is a list of the screening recommended for you and due dates:  Health Maintenance  Topic Date Due   Pneumonia Vaccine (1 of 2 - PCV) Never done   Zoster (Shingles) Vaccine (1 of 2) Never done   Pap Smear  03/11/2022   COVID-19 Vaccine (4 - 2023-24 season) 07/19/2022   Flu Shot  06/19/2023   Colon Cancer Screening  12/02/2023   Medicare Annual Wellness Visit  04/01/2024   DTaP/Tdap/Td vaccine (3 - Td or Tdap) 09/08/2030   DEXA scan (bone density measurement)  Completed   Hepatitis C Screening: USPSTF Recommendation to screen - Ages 42-79 yo.  Completed   HPV Vaccine  Aged Out   Mammogram  Discontinued     Lonna Cobb, [place]   04/02/2023   Nurse Notes: Approximately 30 minute Non-Face -To-Face Medicare Wellness Visit

## 2023-04-17 ENCOUNTER — Other Ambulatory Visit: Payer: Self-pay

## 2023-04-17 ENCOUNTER — Telehealth: Payer: Self-pay

## 2023-04-17 DIAGNOSIS — C50411 Malignant neoplasm of upper-outer quadrant of right female breast: Secondary | ICD-10-CM

## 2023-04-17 MED ORDER — LETROZOLE 2.5 MG PO TABS: 2.5000 mg | ORAL_TABLET | Freq: Every day | ORAL | 1 refills | Status: DC

## 2023-04-17 NOTE — Telephone Encounter (Signed)
Sent Letrozole refill to [facility] pharmacy per request. Spoke to rep who stated she received the e scribe request, nothing else needed

## 2023-04-18 ENCOUNTER — Ambulatory Visit
Admission: RE | Admit: 2023-04-18 | Discharge: 2023-04-18 | Disposition: A | Discharge status: Home / Self Care | Payer: Medicare PPO | Source: Ambulatory Visit | Attending: Oncology | Admitting: Oncology

## 2023-04-18 DIAGNOSIS — C50911 Malignant neoplasm of unspecified site of right female breast: Secondary | ICD-10-CM | POA: Diagnosis not present

## 2023-04-18 DIAGNOSIS — Z170 Estrogen receptor positive status [ER+]: Secondary | ICD-10-CM | POA: Insufficient documentation

## 2023-04-18 DIAGNOSIS — C50912 Malignant neoplasm of unspecified site of left female breast: Secondary | ICD-10-CM | POA: Insufficient documentation

## 2023-04-18 DIAGNOSIS — M545 Low back pain, unspecified: Secondary | ICD-10-CM

## 2023-04-18 MED ORDER — IOHEXOL 300 MG/ML  SOLN
100.0000 mL | Freq: Once | INTRAMUSCULAR | Status: AC | PRN
  Administered 2023-04-18: 100 mL via INTRAVENOUS

## 2023-04-20 ENCOUNTER — Other Ambulatory Visit: Payer: Self-pay | Admitting: Oncology

## 2023-04-22 ENCOUNTER — Inpatient Hospital Stay: Payer: Medicare PPO

## 2023-04-22 ENCOUNTER — Encounter: Payer: Self-pay | Admitting: Oncology

## 2023-04-22 ENCOUNTER — Inpatient Hospital Stay: Payer: Medicare PPO | Attending: Oncology | Admitting: Oncology

## 2023-04-22 VITALS — BP 137/82 | HR 79 | Temp 98.5°F | Resp 18 | Ht 65.0 in | Wt 158.3 lb

## 2023-04-22 DIAGNOSIS — Z87891 Personal history of nicotine dependence: Secondary | ICD-10-CM | POA: Insufficient documentation

## 2023-04-22 DIAGNOSIS — C50912 Malignant neoplasm of unspecified site of left female breast: Secondary | ICD-10-CM | POA: Diagnosis not present

## 2023-04-22 DIAGNOSIS — Z923 Personal history of irradiation: Secondary | ICD-10-CM | POA: Diagnosis not present

## 2023-04-22 DIAGNOSIS — S34125D Incomplete lesion of L5 level of lumbar spinal cord, subsequent encounter: Secondary | ICD-10-CM

## 2023-04-22 DIAGNOSIS — C50911 Malignant neoplasm of unspecified site of right female breast: Secondary | ICD-10-CM

## 2023-04-22 DIAGNOSIS — Z9221 Personal history of antineoplastic chemotherapy: Secondary | ICD-10-CM | POA: Insufficient documentation

## 2023-04-22 DIAGNOSIS — Z800 Family history of malignant neoplasm of digestive organs: Secondary | ICD-10-CM | POA: Insufficient documentation

## 2023-04-22 DIAGNOSIS — Z79811 Long term (current) use of aromatase inhibitors: Secondary | ICD-10-CM | POA: Diagnosis not present

## 2023-04-22 DIAGNOSIS — Z9013 Acquired absence of bilateral breasts and nipples: Secondary | ICD-10-CM | POA: Insufficient documentation

## 2023-04-22 DIAGNOSIS — Z170 Estrogen receptor positive status [ER+]: Secondary | ICD-10-CM | POA: Insufficient documentation

## 2023-04-22 DIAGNOSIS — Z801 Family history of malignant neoplasm of trachea, bronchus and lung: Secondary | ICD-10-CM | POA: Insufficient documentation

## 2023-04-22 DIAGNOSIS — G959 Disease of spinal cord, unspecified: Secondary | ICD-10-CM | POA: Diagnosis not present

## 2023-04-23 LAB — CANCER ANTIGEN 27.29: CA 27.29: 9.9 U/mL (ref 0.0–38.6)

## 2023-04-24 ENCOUNTER — Other Ambulatory Visit: Payer: Self-pay | Admitting: *Deleted

## 2023-04-24 ENCOUNTER — Other Ambulatory Visit: Payer: Medicare PPO

## 2023-04-24 DIAGNOSIS — M545 Low back pain, unspecified: Secondary | ICD-10-CM

## 2023-04-24 DIAGNOSIS — C50911 Malignant neoplasm of unspecified site of right female breast: Secondary | ICD-10-CM

## 2023-04-24 DIAGNOSIS — R937 Abnormal findings on diagnostic imaging of other parts of musculoskeletal system: Secondary | ICD-10-CM

## 2023-04-24 LAB — CANCER ANTIGEN 15-3: CA 15-3: 9.7 U/mL (ref 0.0–25.0)

## 2023-04-25 NOTE — Progress Notes (Signed)
Hematology/Oncology Consult note [facility]  Telephone:([phone] [phone]  Patient Care Team: Allegra Grana, FNP as PCP - General (Family Medicine) Jim Like, RN as Oncology Nurse Navigator Byrnett, Merrily Pew, MD (General Surgery) Glori Luis, MD as Consulting Physician (Family Medicine) Lonell Face, MD as Consulting Physician (Neurology) Creig Hines, MD as Consulting Physician (Oncology)   Name of the patient: Megan Vega  [id number]  1956-09-03   Date of visit: 04/25/23  Diagnosis- stage III right breast cancer and left breast DCIS in 2019   Chief complaint/ Reason for visit-discuss CT scan results and further management  Heme/Onc history: Daila Koetter is a 67 y.o. female with clinical stage [id number] right breast cancer and DCIS in the left breast s/p neoadjuvant chemotherapy followed by left simple mastectomy and right modified radical mastectomy on 07/10/2018.  Biopsy showed 1.4 cm grade 2 invasive mammary carcinoma ER greater than 90% positive PR greater than 90% positive and HER2 negative +1   Patient received neoadjuvant AC Taxol chemotherapy until July 2019.   Left breast pathology revealed 2.8 cm residual high grade DCIS, comedo type.  There was atypical lobular hyperplasia.  Four sentinel lymph nodes were negative.  Right breast pathology revealed 3.6 cm residual grade II invasive mammary carcinoma and high grade DCIS.  There was lymphovascular invasion.There was metastatic carcinoma in 2 sentinel lymph nodes.  Right axillary dissection revealed metastatic carcinoma in 7 lymph nodes.  There were a total of 9 lymph nodes with macrometastasis (largest 22 mm).  Pathologic stage was ypT2 ypN2a.   Patient then received adjuvant radiation treatment and started letrozole in February 2020   She underwent right breast reconstruction with latissimus myocutaneous flap and removal of her port-a-cath on 02/07/2020.   Expanders were taken out in 05/2020 secondary to infection.  She underwent excision of left breast wound with closure on 10/02/2020. Pathology revealed inflamed granulation tissue and fibrosis. There was no evidence of malignancy.  She received adjuvant Zometa for 3 years  Patient found to have L5 vertebral lesion that was hypermetabolic on PET scan.  Biopsies negative for malignancy.  There was no lesions seen elsewhere  Interval history-patient continues to complain of dull aching low back pain which is more of an annoyance for her.  She is not taking any pain medications for the same.  It does not affect her day-to-day quality of life is much  ECOG PS- 0 Pain scale- 2 Opioid associated constipation- no  Review of systems- Review of Systems  Constitutional:  Negative for chills, fever, malaise/fatigue and weight loss.  HENT:  Negative for congestion, ear discharge and nosebleeds.   Eyes:  Negative for blurred vision.  Respiratory:  Negative for cough, hemoptysis, sputum production, shortness of breath and wheezing.   Cardiovascular:  Negative for chest pain, palpitations, orthopnea and claudication.  Gastrointestinal:  Negative for abdominal pain, blood in stool, constipation, diarrhea, heartburn, melena, nausea and vomiting.  Genitourinary:  Negative for dysuria, flank pain, frequency, hematuria and urgency.  Musculoskeletal:  Negative for back pain, joint pain and myalgias.  Skin:  Negative for rash.  Neurological:  Negative for dizziness, tingling, focal weakness, seizures, weakness and headaches.  Endo/Heme/Allergies:  Does not bruise/bleed easily.  Psychiatric/Behavioral:  Negative for depression and suicidal ideas. The patient does not have insomnia.       Allergies  Allergen Reactions   Peanut-Containing Drug Products Swelling and Other (See Comments)    Only when eating too many  peanuts, swelling with lips and tingly face     Past Medical History:  Diagnosis Date   Ankle  fracture    Breast cancer (HCC)    bilateral   Cancer (HCC)    Basal Cell Carcinoma, about 2011 chest   Cataract    Family history of adverse reaction to anesthesia    daughter with PONV   Hemorrhoids    History of methicillin resistant staphylococcus aureus (MRSA) 2015   Migraines    MIGRAINES occ   Personal history of chemotherapy    Personal history of radiation therapy    Wears glasses      Past Surgical History:  Procedure Laterality Date   BREAST BIOPSY Bilateral 10/29/2017   L breast DCIS, R breast invasive mammary carcinoma of no special type, ER/PR+, Her2/neu 1+   BREAST RECONSTRUCTION Right 02/07/2020   Procedure: BREAST RECONSTRUCTION;  Surgeon: Peggye Form, DO;  Location: [facility];  Service: Plastics;  Laterality: Right;   BREAST RECONSTRUCTION WITH PLACEMENT OF TISSUE EXPANDER AND FLEX HD (ACELLULAR HYDRATED DERMIS) Bilateral 08/30/2019   Procedure: BREAST RECONSTRUCTION WITH PLACEMENT OF TISSUE EXPANDER AND FLEX HD (ACELLULAR HYDRATED DERMIS);  Surgeon: Peggye Form, DO;  Location: [facility];  Service: Plastics;  Laterality: Bilateral;  2.5 hours, please   CESAREAN SECTION     COLONOSCOPY  2012   COLONOSCOPY WITH PROPOFOL N/A 12/01/2018   Procedure: COLONOSCOPY WITH PROPOFOL;  Surgeon: Midge Minium, MD;  Location: [facility];  Service: Endoscopy;  Laterality: N/A;   DEBRIDEMENT AND CLOSURE WOUND Bilateral 10/02/2020   Procedure: Excision of bilateral breast wound;  Surgeon: Peggye Form, DO;  Location: [facility];  Service: Plastics;  Laterality: Bilateral;  1.5 hours, please   FRACTURE SURGERY     ankle   IR CT BONE TROCAR/NEEDLE BIOPSY DEEP  12/26/2022   IRRIGATION AND DEBRIDEMENT ABSCESS Right 03/12/2018   Procedure: IRRIGATION AND DEBRIDEMENT RIGHT AXILLARY ABSCESS;  Surgeon: Earline Mayotte, MD;  Location: [facility];  Service: General;  Laterality: Right;   LATISSIMUS FLAP TO BREAST Right 02/07/2020   Procedure: LATISSIMUS  MYOCUTANEOUS FLAP TO BREAST;  Surgeon: Peggye Form, DO;  Location: [facility];  Service: Plastics;  Laterality: Right;  3 hours   MANDIBLE FRACTURE SURGERY     MASTECTOMY     MASTECTOMY MODIFIED RADICAL Right 07/10/2018   ypT2 ypN2a ER/PR positive, HER-2/neu negative  Surgeon: Earline Mayotte, MD;  Location: [facility];  Service: General;  Laterality: Right;   PORT-A-CATH REMOVAL Left 02/07/2020   Procedure: REMOVAL PORT-A-CATH;  Surgeon: Peggye Form, DO;  Location: [facility];  Service: Plastics;  Laterality: Left;   PORTACATH PLACEMENT Left 11/28/2017   Procedure: INSERTION PORT-A-CATH;  Surgeon: Earline Mayotte, MD;  Location: [facility];  Service: General;  Laterality: Left;   REMOVAL OF TISSUE EXPANDER Right 06/05/2020   Procedure: TISSUE EXPANDER REMOVAL;  Surgeon: Peggye Form, DO;  Location: [facility];  Service: Plastics;  Laterality: Right;   SIMPLE MASTECTOMY WITH AXILLARY SENTINEL NODE BIOPSY Left 07/10/2018   High-grade DCIS SIMPLE MASTECTOMY;  Surgeon: Earline Mayotte, MD;  Location: [facility];  Service: General;  Laterality: Left;   TISSUE EXPANDER PLACEMENT Left 06/05/2020   Procedure: TISSUE EXPANDER FLUID REMOVAL-100ML;  Surgeon: Peggye Form, DO;  Location: [facility];  Service: Plastics;  Laterality: Left;   TISSUE EXPANDER PLACEMENT Left 06/14/2020   Procedure: TISSUE EXPANDER REMOVAL;  Surgeon: Peggye Form, DO;  Location: [facility];  Service:  Plastics;  Laterality: Left;  45 min, please    Social History   Socioeconomic History   Marital status: Divorced    Spouse name: Not on file   Number of children: 2   Years of education: Not on file   Highest education level: Not on file  Occupational History   Occupation: Retired  Tobacco Use   Smoking status: Former    Packs/day: 0.25    Years: 10.00    Additional pack years: 0.00    Total pack years: 2.50    Types: Cigarettes    Quit date: 1978    Years since quitting: 46.4   Smokeless  tobacco: Never  Vaping Use   Vaping Use: Never used  Substance and Sexual Activity   Alcohol use: Yes    Alcohol/week: 1.0 standard drink of alcohol    Types: 1 Glasses of wine per week    Comment: " occasional glass of wine "   Drug use: No   Sexual activity: Not Currently    Birth control/protection: Post-menopausal  Other Topics Concern   Not on file  Social History Narrative   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    Lives at home (son 36 yo lives with her)   Children 2   Pets: 3 small dogs and 1 frog    Caffeine- 1 cup of coffee in the morning, rare tea   Social Determinants of Health   Financial Resource Strain: Low Risk  (04/02/2023)   Overall Financial Resource Strain (CARDIA)    Difficulty of Paying Living Expenses: Not hard at all  Food Insecurity: No Food Insecurity (04/02/2023)   Hunger Vital Sign    Worried About Running Out of Food in the Last Year: Never true    Ran Out of Food in the Last Year: Never true  Transportation Needs: No Transportation Needs (04/02/2023)   PRAPARE - Administrator, Civil Service (Medical): No    Lack of Transportation (Non-Medical): No  Physical Activity: Sufficiently Active (04/02/2023)   Exercise Vital Sign    Days of Exercise per Week: 7 days    Minutes of Exercise per Session: 30 min  Stress: No Stress Concern Present (04/02/2023)   [employer] of Occupational Health - Occupational Stress Questionnaire    Feeling of Stress : Not at all  Social Connections: Moderately Isolated (04/02/2023)   Social Connection and Isolation Panel [NHANES]    Frequency of Communication with Friends and Family: More than three times a week    Frequency of Social Gatherings with Friends and Family: Once a week    Attends Religious Services: More than 4 times per year    Active Member of [employer] or Organizations: No    Attends Banker Meetings: Never    Marital Status: Divorced  Catering manager Violence: Not At Risk  (04/02/2023)   Humiliation, Afraid, Rape, and Kick questionnaire    Fear of Current or Ex-Partner: No    Emotionally Abused: No    Physically Abused: No    Sexually Abused: No    Family History  Problem Relation Age of Onset   Cancer Mother        Esophageal Cancer   Early death Mother        Age 55   Cancer Father        Lung Cancer   Diabetes Father    Diabetes Brother        Controlled with diet   Heart attack  Paternal Grandfather    Colon cancer Neg Hx      Current Outpatient Medications:    b complex vitamins tablet, Take 1 tablet by mouth daily., Disp: , Rfl:    Calcium-Magnesium-Vitamin D (CALCIUM 1200+D3 PO), Take 1,200 mg by mouth daily. , Disp: , Rfl:    letrozole (FEMARA) 2.5 MG tablet, Take 1 tablet (2.5 mg total) by mouth daily., Disp: 90 tablet, Rfl: 1   loratadine (CLARITIN) 10 MG tablet, Take 10 mg by mouth every morning. , Disp: , Rfl:    Melatonin 5 MG CAPS, Take 5 mg by mouth daily as needed (sleep). , Disp: , Rfl:    Multiple Vitamin (MULTIVITAMIN WITH MINERALS) TABS tablet, Take 1 tablet by mouth daily. Centrum Silver, Disp: , Rfl:    Naphazoline HCl (CLEAR EYES OP), Place 1 drop into both eyes daily., Disp: , Rfl:    Probiotic Product (PROBIOTIC PO), Take 1 capsule by mouth daily., Disp: , Rfl:  No current facility-administered medications for this visit.  Facility-Administered Medications Ordered in Other Visits:    heparin lock flush 100 unit/mL, 500 Units, Intracatheter, PRN, Rosey Bath, MD  Physical exam:  Vitals:   04/22/23 1407  BP: 137/82  Pulse: 79  Resp: 18  Temp: 98.5 F (36.9 C)  TempSrc: Tympanic  SpO2: 99%  Weight: 158 lb 4.8 oz (71.8 kg)  Height: 5\' 5"  (1.651 m)   Physical Exam Cardiovascular:     Rate and Rhythm: Normal rate and regular rhythm.     Heart sounds: Normal heart sounds.  Pulmonary:     Effort: Pulmonary effort is normal.     Breath sounds: Normal breath sounds.  Abdominal:     General: Bowel sounds  are normal.     Palpations: Abdomen is soft.  Skin:    General: Skin is warm and dry.  Neurological:     Mental Status: She is alert and oriented to person, place, and time.         Latest Ref Rng & Units 09/03/2022    1:54 PM  CMP  Glucose 70 - 99 mg/dL 98   BUN 8 - 23 mg/dL 15   Creatinine 1.61 - 1.00 mg/dL 0.96   Sodium 045 - 409 mmol/L 137   Potassium 3.5 - 5.1 mmol/L 4.5   Chloride 98 - 111 mmol/L 100   CO2 22 - 32 mmol/L 28   Calcium 8.9 - 10.3 mg/dL 9.9   Total Protein 6.5 - 8.1 g/dL 7.9   Total Bilirubin 0.3 - 1.2 mg/dL 0.8   Alkaline Phos 38 - 126 U/L 57   AST 15 - 41 U/L 28   ALT 0 - 44 U/L 19       Latest Ref Rng & Units 12/26/2022    9:53 AM  CBC  WBC 4.0 - 10.5 K/uL 4.0   Hemoglobin 12.0 - 15.0 g/dL 81.1   Hematocrit 91.4 - 46.0 % 43.0   Platelets 150 - 400 K/uL 230     No images are attached to the encounter.  CT CHEST ABDOMEN PELVIS W CONTRAST  Result Date: 04/20/2023 CLINICAL DATA:  Follow-up metastatic breast carcinoma. * Tracking Code: BO * EXAM: CT CHEST, ABDOMEN, AND PELVIS WITH CONTRAST TECHNIQUE: Multidetector CT imaging of the chest, abdomen and pelvis was performed following the standard protocol during bolus administration of intravenous contrast. RADIATION DOSE REDUCTION: This exam was performed according to the departmental dose-optimization program which includes automated exposure control, adjustment of the mA and/or  kV according to patient size and/or use of iterative reconstruction technique. CONTRAST:  OMNIPAQUE IOHEXOL 300 MG/ML  SOLN COMPARISON:  PET-CT on 12/13/2022 FINDINGS: CT CHEST FINDINGS Cardiovascular: No acute findings. Mediastinum/Lymph Nodes: No masses or pathologically enlarged lymph nodes identified. Lungs/Pleura: Stable right upper lobe scarring. No suspicious pulmonary nodules or masses identified. No evidence of infiltrate or pleural effusion. Musculoskeletal: No suspicious bone lesions identified. Previous bilateral  mastectomies. No evidence of axillary lymphadenopathy. CT ABDOMEN AND PELVIS FINDINGS Hepatobiliary: No masses identified. Gallstones are seen, however there is no evidence of cholecystitis or biliary dilatation. Pancreas:  No mass or inflammatory changes. Spleen:  Within normal limits in size and appearance. Adrenals/Urinary tract: No suspicious masses or hydronephrosis. Stomach/Bowel: No evidence of obstruction, inflammatory process, or abnormal fluid collections. Vascular/Lymphatic: No pathologically enlarged lymph nodes identified. No acute vascular findings. Reproductive:  No mass or other significant abnormality identified. Other:  None. Musculoskeletal: Mixed lytic and sclerotic bone lesion in the L5 vertebral body again seen, which showed hypermetabolism on previous PET scan. No other suspicious bone lesions identified. IMPRESSION: Mixed lytic and sclerotic bone lesion again seen in L5 vertebral body, consistent with metastatic disease. No evidence of soft tissue metastatic disease within the chest, abdomen, or pelvis. Cholelithiasis. No radiographic evidence of cholecystitis. Electronically Signed   By: Danae Orleans M.D.   On: 04/20/2023 13:04     Assessment and plan- Patient is a 67 y.o. female  with history of stage III right breast cancer T2 N3 M0 ER/PR positive HER2 negative and left breast DCIS s/p bilateral mastectomy currently on Femara.  She is here to discuss CT scan results and further management  I have reviewed CT chest abdomen pelvis images independently and discussed findings with the patient which continues to show mixed lytic and sclerotic lesion in the L5 vertebral body consistent with metastatic disease.  There was no other evidence of disease seen elsewhere.  Biopsy x 1 6 months ago was negative.  I discussed her case with interventional radiology and the plan at this time is to proceed with a second biopsy and the osteochondral procedure at the same time.  Following that I will  refer the patient to radiation oncology as well.  And the second biopsy is positive for malignancy and will also help me assess the ER/PR and HER2 status.  If she is ER/PR positive and HER2 negative which most likely appears to be the case given that she has not had any disease progression despite staying on Femara, I will consider adding Ibrance or Verzenio for her.  Follow-up with me to be decided based on repeat biopsy   Visit Diagnosis 1. Bilateral malignant neoplasm of breast in female, estrogen receptor positive, unspecified site of breast (HCC)   2. Incomplete lesion of L5 level of lumbar spinal cord, subsequent encounter [facility])      Dr. Owens Shark, MD, MPH [facility] [phone] 04/25/2023 8:52 AM

## 2023-04-28 ENCOUNTER — Ambulatory Visit
Admission: RE | Admit: 2023-04-28 | Discharge: 2023-04-28 | Disposition: A | Discharge status: Home / Self Care | Payer: Medicare PPO | Source: Ambulatory Visit | Attending: Oncology | Admitting: Oncology

## 2023-04-28 ENCOUNTER — Encounter: Payer: Self-pay | Admitting: Oncology

## 2023-04-28 ENCOUNTER — Encounter: Payer: Self-pay | Admitting: Hematology and Oncology

## 2023-04-28 ENCOUNTER — Other Ambulatory Visit: Payer: Self-pay | Admitting: Oncology

## 2023-04-28 DIAGNOSIS — M545 Low back pain, unspecified: Secondary | ICD-10-CM

## 2023-04-28 DIAGNOSIS — C50912 Malignant neoplasm of unspecified site of left female breast: Secondary | ICD-10-CM

## 2023-04-28 DIAGNOSIS — C50911 Malignant neoplasm of unspecified site of right female breast: Secondary | ICD-10-CM

## 2023-04-28 DIAGNOSIS — Z170 Estrogen receptor positive status [ER+]: Secondary | ICD-10-CM

## 2023-04-28 DIAGNOSIS — R937 Abnormal findings on diagnostic imaging of other parts of musculoskeletal system: Secondary | ICD-10-CM

## 2023-04-28 HISTORY — PX: IR RADIOLOGIST EVAL & MGMT: IMG5224

## 2023-04-28 NOTE — H&P (Signed)
Reason for visit: consultation today for solitary osseous spinal metastatic lesion   Care Team(s): Primary Care; Arnett, Lyn Records, FNP Medical Oncology; Creig Hines, MD and Jim Like, RN as Oncology Nurse Navigator  Virtual Visit via Telephone Note   I connected with [name] on 04/28/23 by telephone and verified that I am speaking with the correct person using two identifiers.  I discussed the limitations, risks, security and privacy concerns of performing an evaluation and management service by telephone and the availability of in-person appointments.  History of Present Illness:  Megan Vega is a 67 y.o. female w PMHx significant for R breast IDC and L breast DCIS Dx in 2019, s/p bilateral mastectomies. Initial staging was negative for extranodal metastatic disease. Pt was treated with neoadjuvant chemoRx, followed by adjuvant XRT and AI (letrozole) in 12/2018.   Pt began complaining of lower back pain in Q2-01/2022, with initial imaging with L-spine XR demonstrating degenerative change. Her pain persisted and she had an MRI L spine followed by PET CT in 11/2022, which were revealing for an enhancing and hypermetabolic lesion at L5 vertebral body, suspicious for osseous metastasis. A confirmation bone biopsy in further evaluation was performed on 12/26/22, with the result negative for malignant cells. Imaging review was that the result was likely consistent with sampling error. A multi disciplinary discussion was had between myself, Medical and Radiation Oncology for additional therapy for the solitary metastatic osseous spinal lesion with RFA.   Pt describes; a persistent and focused, low-back pain, 2/10 at it's worst and only mildly relieved with PO pain relief. She reports a significant decrease in QoL given the constant pain.  Review of Systems: A 12-point ROS discussed, and pertinent positives are indicated in the HPI above.  All other systems are  negative.   Past Medical History:  Diagnosis Date   Ankle fracture    Breast cancer (HCC)    bilateral   Cancer (HCC)    Basal Cell Carcinoma, about 2011 chest   Cataract    Family history of adverse reaction to anesthesia    daughter with PONV   Hemorrhoids    History of methicillin resistant staphylococcus aureus (MRSA) 2015   Migraines    MIGRAINES occ   Personal history of chemotherapy    Personal history of radiation therapy    Wears glasses     Past Surgical History:  Procedure Laterality Date   BREAST BIOPSY Bilateral 10/29/2017   L breast DCIS, R breast invasive mammary carcinoma of no special type, ER/PR+, Her2/neu 1+   BREAST RECONSTRUCTION Right 02/07/2020   Procedure: BREAST RECONSTRUCTION;  Surgeon: Peggye Form, DO;  Location: [facility];  Service: Plastics;  Laterality: Right;   BREAST RECONSTRUCTION WITH PLACEMENT OF TISSUE EXPANDER AND FLEX HD (ACELLULAR HYDRATED DERMIS) Bilateral 08/30/2019   Procedure: BREAST RECONSTRUCTION WITH PLACEMENT OF TISSUE EXPANDER AND FLEX HD (ACELLULAR HYDRATED DERMIS);  Surgeon: Peggye Form, DO;  Location: [facility];  Service: Plastics;  Laterality: Bilateral;  2.5 hours, please   CESAREAN SECTION     COLONOSCOPY  2012   COLONOSCOPY WITH PROPOFOL N/A 12/01/2018   Procedure: COLONOSCOPY WITH PROPOFOL;  Surgeon: Midge Minium, MD;  Location: [facility];  Service: Endoscopy;  Laterality: N/A;   DEBRIDEMENT AND CLOSURE WOUND Bilateral 10/02/2020   Procedure: Excision of bilateral breast wound;  Surgeon: Peggye Form, DO;  Location: [facility];  Service: Plastics;  Laterality: Bilateral;  1.5 hours, please   FRACTURE SURGERY  ankle   IR CT BONE TROCAR/NEEDLE BIOPSY DEEP  12/26/2022   IRRIGATION AND DEBRIDEMENT ABSCESS Right 03/12/2018   Procedure: IRRIGATION AND DEBRIDEMENT RIGHT AXILLARY ABSCESS;  Surgeon: Earline Mayotte, MD;  Location: [facility];  Service: General;  Laterality: Right;    LATISSIMUS FLAP TO BREAST Right 02/07/2020   Procedure: LATISSIMUS MYOCUTANEOUS FLAP TO BREAST;  Surgeon: Peggye Form, DO;  Location: [facility];  Service: Plastics;  Laterality: Right;  3 hours   MANDIBLE FRACTURE SURGERY     MASTECTOMY     MASTECTOMY MODIFIED RADICAL Right 07/10/2018   ypT2 ypN2a ER/PR positive, HER-2/neu negative  Surgeon: Earline Mayotte, MD;  Location: [facility];  Service: General;  Laterality: Right;   PORT-A-CATH REMOVAL Left 02/07/2020   Procedure: REMOVAL PORT-A-CATH;  Surgeon: Peggye Form, DO;  Location: [facility];  Service: Plastics;  Laterality: Left;   PORTACATH PLACEMENT Left 11/28/2017   Procedure: INSERTION PORT-A-CATH;  Surgeon: Earline Mayotte, MD;  Location: [facility];  Service: General;  Laterality: Left;   REMOVAL OF TISSUE EXPANDER Right 06/05/2020   Procedure: TISSUE EXPANDER REMOVAL;  Surgeon: Peggye Form, DO;  Location: [facility];  Service: Plastics;  Laterality: Right;   SIMPLE MASTECTOMY WITH AXILLARY SENTINEL NODE BIOPSY Left 07/10/2018   High-grade DCIS SIMPLE MASTECTOMY;  Surgeon: Earline Mayotte, MD;  Location: [facility];  Service: General;  Laterality: Left;   TISSUE EXPANDER PLACEMENT Left 06/05/2020   Procedure: TISSUE EXPANDER FLUID REMOVAL-100ML;  Surgeon: Peggye Form, DO;  Location: [facility];  Service: Plastics;  Laterality: Left;   TISSUE EXPANDER PLACEMENT Left 06/14/2020   Procedure: TISSUE EXPANDER REMOVAL;  Surgeon: Peggye Form, DO;  Location: [facility];  Service: Plastics;  Laterality: Left;  45 min, please    Allergies: Peanut-containing drug products  Medications: Prior to Admission medications   Medication Sig Start Date End Date Taking? Authorizing Provider  b complex vitamins tablet Take 1 tablet by mouth daily.    [provider]  Calcium-Magnesium-Vitamin D (CALCIUM 1200+D3 PO) Take 1,200 mg by mouth daily.     [provider]  letrozole (FEMARA) 2.5 MG tablet Take 1 tablet  (2.5 mg total) by mouth daily. 04/17/23   Creig Hines, MD  loratadine (CLARITIN) 10 MG tablet Take 10 mg by mouth every morning.     [provider]  Melatonin 5 MG CAPS Take 5 mg by mouth daily as needed (sleep).     [provider]  Multiple Vitamin (MULTIVITAMIN WITH MINERALS) TABS tablet Take 1 tablet by mouth daily. Centrum Silver    [provider]  Naphazoline HCl (CLEAR EYES OP) Place 1 drop into both eyes daily.    [provider]  Probiotic Product (PROBIOTIC PO) Take 1 capsule by mouth daily.    [provider]     Family History  Problem Relation Age of Onset   Cancer Mother        Esophageal Cancer   Early death Mother        Age 48   Cancer Father        Lung Cancer   Diabetes Father    Diabetes Brother        Controlled with diet   Heart attack Paternal Grandfather    Colon cancer Neg Hx     Social History   Socioeconomic History   Marital status: Divorced    Spouse name: Not on file   Number of children: 2  Years of education: Not on file   Highest education level: Not on file  Occupational History   Occupation: Retired  Tobacco Use   Smoking status: Former    Packs/day: 0.25    Years: 10.00    Additional pack years: 0.00    Total pack years: 2.50    Types: Cigarettes    Quit date: 1978    Years since quitting: 46.4   Smokeless tobacco: Never  Vaping Use   Vaping Use: Never used  Substance and Sexual Activity   Alcohol use: Yes    Alcohol/week: 1.0 standard drink of alcohol    Types: 1 Glasses of wine per week    Comment: " occasional glass of wine "   Drug use: No   Sexual activity: Not Currently    Birth control/protection: Post-menopausal  Other Topics Concern   Not on file  Social History Narrative   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    Lives at home (son 87 yo lives with her)   Children 2   Pets: 3 small dogs and 1 frog    Caffeine- 1 cup of coffee in the morning, rare tea   Social  Determinants of Health   Financial Resource Strain: Low Risk  (04/02/2023)   Overall Financial Resource Strain (CARDIA)    Difficulty of Paying Living Expenses: Not hard at all  Food Insecurity: No Food Insecurity (04/02/2023)   Hunger Vital Sign    Worried About Running Out of Food in the Last Year: Never true    Ran Out of Food in the Last Year: Never true  Transportation Needs: No Transportation Needs (04/02/2023)   PRAPARE - Administrator, Civil Service (Medical): No    Lack of Transportation (Non-Medical): No  Physical Activity: Sufficiently Active (04/02/2023)   Exercise Vital Sign    Days of Exercise per Week: 7 days    Minutes of Exercise per Session: 30 min  Stress: No Stress Concern Present (04/02/2023)   [employer] of Occupational Health - Occupational Stress Questionnaire    Feeling of Stress : Not at all  Social Connections: Moderately Isolated (04/02/2023)   Social Connection and Isolation Panel [NHANES]    Frequency of Communication with Friends and Family: More than three times a week    Frequency of Social Gatherings with Friends and Family: Once a week    Attends Religious Services: More than 4 times per year    Active Member of [employer] or Organizations: No    Attends Banker Meetings: Never    Marital Status: Divorced    ECOG Status: 1 - Symptomatic but completely ambulatory  Review of Systems As above  Vital Signs: LMP  (LMP Unknown)   Physical Exam Musculoskeletal:       Arms:     Comments: Reported persistent low back pain    Imaging:  MR L-spine (12/09/22), CT CAP (04/18/23) and PET CT (12/13/22)  Solitary (oligo-) metastasis to bone at L5     CT CHEST ABDOMEN PELVIS W CONTRAST  Result Date: 04/20/2023 CLINICAL DATA:  Follow-up metastatic breast carcinoma. * Tracking Code: BO * EXAM: CT CHEST, ABDOMEN, AND PELVIS WITH CONTRAST TECHNIQUE: Multidetector CT imaging of the chest, abdomen and pelvis was performed following  the standard protocol during bolus administration of intravenous contrast. RADIATION DOSE REDUCTION: This exam was performed according to the departmental dose-optimization program which includes automated exposure control, adjustment of the mA and/or kV according to patient size and/or use  of iterative reconstruction technique. CONTRAST:  OMNIPAQUE IOHEXOL 300 MG/ML  SOLN COMPARISON:  PET-CT on 12/13/2022 FINDINGS: CT CHEST FINDINGS Cardiovascular: No acute findings. Mediastinum/Lymph Nodes: No masses or pathologically enlarged lymph nodes identified. Lungs/Pleura: Stable right upper lobe scarring. No suspicious pulmonary nodules or masses identified. No evidence of infiltrate or pleural effusion. Musculoskeletal: No suspicious bone lesions identified. Previous bilateral mastectomies. No evidence of axillary lymphadenopathy. CT ABDOMEN AND PELVIS FINDINGS Hepatobiliary: No masses identified. Gallstones are seen, however there is no evidence of cholecystitis or biliary dilatation. Pancreas:  No mass or inflammatory changes. Spleen:  Within normal limits in size and appearance. Adrenals/Urinary tract: No suspicious masses or hydronephrosis. Stomach/Bowel: No evidence of obstruction, inflammatory process, or abnormal fluid collections. Vascular/Lymphatic: No pathologically enlarged lymph nodes identified. No acute vascular findings. Reproductive:  No mass or other significant abnormality identified. Other:  None. Musculoskeletal: Mixed lytic and sclerotic bone lesion in the L5 vertebral body again seen, which showed hypermetabolism on previous PET scan. No other suspicious bone lesions identified. IMPRESSION: Mixed lytic and sclerotic bone lesion again seen in L5 vertebral body, consistent with metastatic disease. No evidence of soft tissue metastatic disease within the chest, abdomen, or pelvis. Cholelithiasis. No radiographic evidence of cholecystitis. Electronically Signed   By: Danae Orleans M.D.   On:  04/20/2023 13:04    Labs:  CBC: Recent Labs    12/26/22 0953  WBC 4.0  HGB 14.5  HCT 43.0  PLT 230    COAGS: Recent Labs    12/26/22 0953  INR 1.0    BMP: Recent Labs    09/03/22 1354  NA 137  K 4.5  CL 100  CO2 28  GLUCOSE 98  BUN 15  CALCIUM 9.9  CREATININE 0.90  GFRNONAA >60    LIVER FUNCTION TESTS: Recent Labs    09/03/22 1354  BILITOT 0.8  AST 28  ALT 19  ALKPHOS 57  PROT 7.9  ALBUMIN 4.5    TUMOR MARKERS:   Cancer Staging  Primary cancer of upper outer quadrant of right breast (HCC) Staging form: Breast, AJCC 8th Edition - Clinical stage from 11/24/2017: Stage IIIB (cT2, cN3b, cM0, G2, ER+, PR+, HER2-) - Unsigned Histopathologic type: Infiltrating duct carcinoma, NOS Nuclear grade: G2 Histologic grading system: 3 grade system Diagnostic confirmation: Positive histology Specimen type: Core Needle Biopsy Staged by: Managing physician HER2-IHC interpretation: Negative HER2-IHC value: Score 1+ Circulating tumor cells (CTC): Unknown Disseminated tumor cells: Not assessed Stage used in treatment planning: Yes National guidelines used in treatment planning: Yes Type of national guideline used in treatment planning: NCCN   Assessment and Plan:  67 y/o F w PMHx significant for R breast IDC and L breast DCIS Dx in 2019, s/p bilateral mastectomies. Pt p/w symptomatic, solitary osseous spinal (oligo-) metastasis at L5. Pt referred to VIR for repeat biopsy, and percutaneous ablation with stabilization.  Ongoing, life-style limiting pain secondary to the spinal metastasis, refractory to conservative management. Pain on exam concordant with level of lesion. Failure of conservative therapy and pain refractory to pain mediation  ICD-10-CM Codes that Support Medical Necessity ([email or web address]?[email or web address])  C79.51 Secondary malignant neoplasm of bone  The Pt is interested in pursuing a  minimally-invasive option for therapy of her spinal metastasis at this time, and is curious about Radiofrequency Ablation.  Risks were discussed including, but not limited to, bleeding, infection, cement migration which may cause spinal cord damage, paralysis, pulmonary embolism or even death.   The procedure has been fully reviewed with the  patient/patient's authorized representative. The risks, benefits and alternatives have been explained, and the patient/patient's authorized representative has consented to the procedure.    *Pt is a candidate for L5, Biopsy, Percutaneous RFA "Osteocool" and Vertebral Augmentation with Balloon Kyphoplasty  *MR L-spine, CT chest and PET/CT reviewed. No additional imaging required. *Procedure to be performed at [facility]. *Will request Electronics engineer presence ([employer]tr[employer] KP / Osteocool) *Same day procedure, no overnight admission. *Ancef for pre op Abx. *Recommend post cement augmentation XRT mapping per Rad Onc.   Thank you for this interesting consult.  I greatly enjoyed meeting Megan Vega and look forward to participating in their care.  A copy of this report was sent to the requesting provider on this date.  Electronically Signed:  Roanna Banning, MD [facility]   Pager. [phone] Clinic. [phone]  I spent a total of 45 Minutes of non-face-to-face time in clinical consultation, greater than 50% of which was counseling/coordinating care for [name]'s evaluation for symptomatic L5 spinal metastasis

## 2023-04-29 ENCOUNTER — Other Ambulatory Visit: Payer: Self-pay | Admitting: Interventional Radiology

## 2023-04-29 DIAGNOSIS — C7951 Secondary malignant neoplasm of bone: Secondary | ICD-10-CM

## 2023-05-02 NOTE — Progress Notes (Signed)
Zollie Scale sent to Rema Jasmine, Trixie Deis, RT; [place], [place]cy B, [place] This has been approved [id number] for codes 11914 and 20982 exp 07/13/23       Previous Messages    ----- Message ----- From: Zollie Scale Sent: 04/29/2023   8:36 AM EDT To: Meta Hatchet, RT; Lynda Rainwater, RT; * Subject: Interventional Radiology Procedure  Request    Interventional Radiology Procedure  Request  Name: Megan Vega 12-Aug-1956  Procedure: L5 bx/osteocool/kp  Reason for procedure: L5 lesion  Relevant History: In EPIC  Order Physician: [place]  Office Contact: Victorino Dike

## 2023-05-12 NOTE — H&P (Signed)
Chief Complaint: Patient was seen in consultation today for solitary osseous spinal metastatic lesion at the request of Megan Vega  Referring Physician(s): Megan Vega  Supervising Physician: Megan Vega  Patient Status: [facility] - Out-pt  History of Present Illness:  Megan Vega is a 67 y.o. female followed by oncology for bilateral breast cancer who underwent MR lumbar spine after complaining of chronic lower back pain 12/10/2022 that demonstrated: IMPRESSION: 1. 2.1 cm enhancing lesion in the L5 vertebral body, not clearly present on the prior PET-CT. Given history of breast cancer, this is concerning for metastatic disease. 2. Multilevel lumbar spondylosis as described above. Moderate spinal canal stenosis at L3-L4 and L4-L5. She was referred to IR for consult and management of spinal lesion.  Patient consulted with Dr. Milford Cage, IR 04/28/2023 to discuss treatment options.  At that time patient was advised that she was a candidate for L5 biopsy with percutaneous radiofrequency osteo cool and vertebral augmentation with balloon kyphoplasty.  Patient sided at that time to proceed.  Past Medical History:  Diagnosis Date   Ankle fracture    Breast cancer (HCC)    bilateral   Cancer (HCC)    Basal Cell Carcinoma, about 2011 chest   Cataract    Family history of adverse reaction to anesthesia    daughter with PONV   Hemorrhoids    History of methicillin resistant staphylococcus aureus (MRSA) 2015   Migraines    MIGRAINES occ   Personal history of chemotherapy    Personal history of radiation therapy    Wears glasses     Past Surgical History:  Procedure Laterality Date   BREAST BIOPSY Bilateral 10/29/2017   L breast DCIS, R breast invasive mammary carcinoma of no special type, ER/PR+, Her2/neu 1+   BREAST RECONSTRUCTION Right 02/07/2020   Procedure: BREAST RECONSTRUCTION;  Surgeon: Megan Form, DO;  [facility];  Service: Plastics;  Laterality: Right;    BREAST RECONSTRUCTION WITH PLACEMENT OF TISSUE EXPANDER AND FLEX HD (ACELLULAR HYDRATED DERMIS) Bilateral 08/30/2019   Procedure: BREAST RECONSTRUCTION WITH PLACEMENT OF TISSUE EXPANDER AND FLEX HD (ACELLULAR HYDRATED DERMIS);  Surgeon: Megan Form, DO;  Location: [facility] ORS;  Service: Plastics;  Laterality: Bilateral;  2.5 hours, please   CESAREAN SECTION     COLONOSCOPY  2012   COLONOSCOPY WITH PROPOFOL N/A 12/01/2018   Procedure: COLONOSCOPY WITH PROPOFOL;  Surgeon: Midge Minium, MD;  Location: [facility] ENDOSCOPY;  Service: Endoscopy;  Laterality: N/A;   DEBRIDEMENT AND CLOSURE WOUND Bilateral 10/02/2020   Procedure: Excision of bilateral breast wound;  Surgeon: Megan Form, DO;  Location:  SURGERY CENTER;  Service: Plastics;  Laterality: Bilateral;  1.5 hours, please   FRACTURE SURGERY     ankle   IR CT BONE TROCAR/NEEDLE BIOPSY DEEP  12/26/2022   IR RADIOLOGIST EVAL & MGMT  04/28/2023   IRRIGATION AND DEBRIDEMENT ABSCESS Right 03/12/2018   Procedure: IRRIGATION AND DEBRIDEMENT RIGHT AXILLARY ABSCESS;  Surgeon: Megan Mayotte, MD;  Location: [facility] ORS;  Service: General;  Laterality: Right;   LATISSIMUS FLAP TO BREAST Right 02/07/2020   Procedure: LATISSIMUS MYOCUTANEOUS FLAP TO BREAST;  Surgeon: Megan Form, DO;  Location: [facility];  Service: Plastics;  Laterality: Right;  3 hours   MANDIBLE FRACTURE SURGERY     MASTECTOMY     MASTECTOMY MODIFIED RADICAL Right 07/10/2018   ypT2 ypN2a ER/PR positive, HER-2/neu negative  Surgeon: Megan Mayotte, MD;  Location: [facility] ORS;  Service: General;  Laterality: Right;   PORT-A-CATH  REMOVAL Left 02/07/2020   Procedure: REMOVAL PORT-A-CATH;  Surgeon: Megan Form, DO;  [facility]: [facility];  Service: Plastics;  Laterality: Left;   PORTACATH PLACEMENT Left 11/28/2017   Procedure: INSERTION PORT-A-CATH;  Surgeon: Megan Mayotte, MD;  Location: [facility] ORS;  Service: General;  Laterality: Left;   REMOVAL OF TISSUE EXPANDER  Right 06/05/2020   Procedure: TISSUE EXPANDER REMOVAL;  Surgeon: Megan Form, DO;  Location: [facility] ORS;  Service: Plastics;  Laterality: Right;   SIMPLE MASTECTOMY WITH AXILLARY SENTINEL NODE BIOPSY Left 07/10/2018   High-grade DCIS SIMPLE MASTECTOMY;  Surgeon: Megan Mayotte, MD;  Location: [facility] ORS;  Service: General;  Laterality: Left;   TISSUE EXPANDER PLACEMENT Left 06/05/2020   Procedure: TISSUE EXPANDER FLUID REMOVAL-100ML;  Surgeon: Megan Form, DO;  Location: [facility] ORS;  Service: Plastics;  Laterality: Left;   TISSUE EXPANDER PLACEMENT Left 06/14/2020   Procedure: TISSUE EXPANDER REMOVAL;  Surgeon: Megan Form, DO;  [facility];  Service: Plastics;  Laterality: Left;  45 min, please    Allergies: Peanut-containing drug products  Medications: Prior to Admission medications   Medication Sig Start Date End Date Taking? Authorizing Provider  b complex vitamins tablet Take 1 tablet by mouth daily.    [provider]  Calcium-Magnesium-Vitamin D (CALCIUM 1200+D3 PO) Take 1,200 mg by mouth daily.     [provider]  letrozole (FEMARA) 2.5 MG tablet Take 1 tablet (2.5 mg total) by mouth daily. 04/17/23   Megan Hines, MD  loratadine (CLARITIN) 10 MG tablet Take 10 mg by mouth every morning.     [provider]  Melatonin 5 MG CAPS Take 5 mg by mouth daily as needed (sleep).     [provider]  Multiple Vitamin (MULTIVITAMIN WITH MINERALS) TABS tablet Take 1 tablet by mouth daily. Centrum Silver    [provider]  Naphazoline HCl (CLEAR EYES OP) Place 1 drop into both eyes daily.    [provider]  Probiotic Product (PROBIOTIC PO) Take 1 capsule by mouth daily.    [provider]     Family History  Problem Relation Age of Onset   Cancer Mother        Esophageal Cancer   Early death Mother        Age 33   Cancer Father        Lung Cancer   Diabetes Father    Diabetes Brother         Controlled with diet   Heart attack Paternal Grandfather    Colon cancer Neg Hx     Social History   Socioeconomic History   Marital status: Divorced    Spouse name: Not on file   Number of children: 2   Years of education: Not on file   Highest education level: Not on file  Occupational History   Occupation: Retired  Tobacco Use   Smoking status: Former    Packs/day: 0.25    Years: 10.00    Additional pack years: 0.00    Total pack years: 2.50    Types: Cigarettes    Quit date: 1978    Years since quitting: 46.5   Smokeless tobacco: Never  Vaping Use   Vaping Use: Never used  Substance and Sexual Activity   Alcohol use: Yes    Alcohol/week: 1.0 standard drink of alcohol    Types: 1 Glasses of wine per week    Comment: " occasional glass of wine "  Drug use: No   Sexual activity: Not Currently    Birth control/protection: Post-menopausal  Other Topics Concern   Not on file  Social History Narrative   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    Lives at home (son 15 yo lives with her)   Children 2   Pets: 3 small dogs and 1 frog    Caffeine- 1 cup of coffee in the morning, rare tea   Social Determinants of Health   Financial Resource Strain: Low Risk  (04/02/2023)   Overall Financial Resource Strain (CARDIA)    Difficulty of Paying Living Expenses: Not hard at all  Food Insecurity: No Food Insecurity (04/02/2023)   Hunger Vital Sign    Worried About Running Out of Food in the Last Year: Never true    Ran Out of Food in the Last Year: Never true  Transportation Needs: No Transportation Needs (04/02/2023)   PRAPARE - Administrator, Civil Service (Medical): No    Lack of Transportation (Non-Medical): No  Physical Activity: Sufficiently Active (04/02/2023)   Exercise Vital Sign    Days of Exercise per Week: 7 days    Minutes of Exercise per Session: 30 min  Stress: No Stress Concern Present (04/02/2023)   [employer] of Occupational Health -  Occupational Stress Questionnaire    Feeling of Stress : Not at all  Social Connections: Moderately Isolated (04/02/2023)   Social Connection and Isolation Panel [NHANES]    Frequency of Communication with Friends and Family: More than three times a week    Frequency of Social Gatherings with Friends and Family: Once a week    Attends Religious Services: More than 4 times per year    Active Member of [employer] or Organizations: No    Attends Banker Meetings: Never    Marital Status: Divorced    Review of Systems: A 12 point ROS discussed and pertinent positives are indicated in the HPI above.  All other systems are negative.  Review of Systems  Vital Signs: LMP  (LMP Unknown)   Advance Care Plan: {Advance Care NWGN:[id number]}    Physical Exam  Imaging: IR Radiologist Eval & Mgmt  Result Date: 04/28/2023 EXAM: NEW PATIENT OFFICE VISIT CHIEF COMPLAINT: See below HISTORY OF PRESENT ILLNESS: See below REVIEW OF SYSTEMS: See below PHYSICAL EXAMINATION: See below ASSESSMENT AND PLAN: Please refer to completed note in the electronic medical record on [place] [facility] Megan Banning, MD [facility] Inter[facility] Radiology Electronically Signed   By: Megan Vega M.D.   On: 04/28/2023 08:54   CT CHEST ABDOMEN PELVIS W CONTRAST  Result Date: 04/20/2023 CLINICAL DATA:  Follow-up metastatic breast carcinoma. * Tracking Code: BO * EXAM: CT CHEST, ABDOMEN, AND PELVIS WITH CONTRAST TECHNIQUE: Multidetector CT imaging of the chest, abdomen and pelvis was performed following the standard protocol during bolus administration of intravenous contrast. RADIATION DOSE REDUCTION: This exam was performed according to the departmental dose-optimization program which includes automated exposure control, adjustment of the mA and/or kV according to patient size and/or use of iterative reconstruction technique. CONTRAST:  OMNIPAQUE IOHEXOL 300 MG/ML  SOLN COMPARISON:   PET-CT on 12/13/2022 FINDINGS: CT CHEST FINDINGS Cardiovascular: No acute findings. Mediastinum/Lymph Nodes: No masses or pathologically enlarged lymph nodes identified. Lungs/Pleura: Stable right upper lobe scarring. No suspicious pulmonary nodules or masses identified. No evidence of infiltrate or pleural effusion. Musculoskeletal: No suspicious bone lesions identified. Previous bilateral mastectomies. No evidence of axillary lymphadenopathy. CT  ABDOMEN AND PELVIS FINDINGS Hepatobiliary: No masses identified. Gallstones are seen, however there is no evidence of cholecystitis or biliary dilatation. Pancreas:  No mass or inflammatory changes. Spleen:  Within normal limits in size and appearance. Adrenals/Urinary tract: No suspicious masses or hydronephrosis. Stomach/Bowel: No evidence of obstruction, inflammatory process, or abnormal fluid collections. Vascular/Lymphatic: No pathologically enlarged lymph nodes identified. No acute vascular findings. Reproductive:  No mass or other significant abnormality identified. Other:  None. Musculoskeletal: Mixed lytic and sclerotic bone lesion in the L5 vertebral body again seen, which showed hypermetabolism on previous PET scan. No other suspicious bone lesions identified. IMPRESSION: Mixed lytic and sclerotic bone lesion again seen in L5 vertebral body, consistent with metastatic disease. No evidence of soft tissue metastatic disease within the chest, abdomen, or pelvis. Cholelithiasis. No radiographic evidence of cholecystitis. Electronically Signed   By: Danae Orleans M.D.   On: 04/20/2023 13:04    Labs:  CBC: Recent Labs    12/26/22 0953  WBC 4.0  HGB 14.5  HCT 43.0  PLT 230    COAGS: Recent Labs    12/26/22 0953  INR 1.0    BMP: Recent Labs    09/03/22 1354  NA 137  K 4.5  CL 100  CO2 28  GLUCOSE 98  BUN 15  CALCIUM 9.9  CREATININE 0.90  GFRNONAA >60    LIVER FUNCTION TESTS: Recent Labs    09/03/22 1354  BILITOT 0.8  AST 28  ALT  19  ALKPHOS 57  PROT 7.9  ALBUMIN 4.5    TUMOR MARKERS: No results for input(s): "AFPTM", "CEA", "CA199", "CHROMGRNA" in the last 8760 hours.  Assessment and Plan:  67 year old female with PMHx significant for right and left breast cancer status post bilateral mastectomy and L5 spinal lesion presents to IR for L5 kyphoplasty with osteocool and biopsy.  Risks and benefits of L5 biopsy, percutaneous RFA osteocool and vertebral augmentation with balloon kyphoplasty with moderate sedation were discussed with the patient including, but not limited to education regarding the natural healing process of compression fractures without intervention, bleeding, infection, cement migration which may cause spinal cord damage, paralysis, pulmonary embolism or even death.  This interventional procedure involves the use of X-rays and because of the nature of the planned procedure, it is possible that we will have prolonged use of X-ray fluoroscopy.  Potential radiation risks to you include (but are not limited to) the following: - A slightly elevated risk for cancer  several years later in life. This risk is typically less than 0.5% percent. This risk is low in comparison to the normal incidence of human cancer, which is 33% for women and 50% for men according to the American Cancer Society. - Radiation induced injury can include skin redness, resembling a rash, tissue breakdown / ulcers and hair loss (which can be temporary or permanent).   The likelihood of either of these occurring depends on the difficulty of the procedure and whether you are sensitive to radiation due to previous procedures, disease, or genetic conditions.   IF your procedure requires a prolonged use of radiation, you will be notified and given written instructions for further action.  It is your responsibility to monitor the irradiated area for the 2 weeks following the procedure and to notify your physician if you are concerned that  you have suffered a radiation induced injury.    All of the patient's questions were answered, patient is agreeable to proceed.  Consent signed and in chart.  Thank you for this interesting consult.  I greatly enjoyed meeting Ariela Mochizuki and look forward to participating in their care.  A copy of this report was sent to the requesting provider on this date.  Electronically Signed: Shon Hough, NP 05/12/2023, 3:28 PM   I spent a total of {New AVWU:[id number]} {New Out-Pt:[id number]}  {Established Out-Pt:[id number]} in face to face in clinical consultation, greater than 50% of which was counseling/coordinating care for symptomatic L5 spinal lesion.

## 2023-05-13 ENCOUNTER — Other Ambulatory Visit (HOSPITAL_COMMUNITY): Payer: Self-pay | Admitting: Student

## 2023-05-13 DIAGNOSIS — G8929 Other chronic pain: Secondary | ICD-10-CM

## 2023-05-13 NOTE — Progress Notes (Signed)
Patient for L5 bx/osteocool/KP on Wed 05/14/2023, I called and spoke with the patient on the phone and gave pre-procedure instructions. Pt was made aware to be here at 12p, NPO after MN prior to procedure as well as driver post procedure/recovery/discharge. Pt stated understanding.  Called 05/13/2023

## 2023-05-14 ENCOUNTER — Other Ambulatory Visit: Payer: Self-pay

## 2023-05-14 ENCOUNTER — Encounter: Payer: Self-pay | Admitting: Radiology

## 2023-05-14 ENCOUNTER — Ambulatory Visit
Admission: RE | Admit: 2023-05-14 | Discharge: 2023-05-14 | Disposition: A | Discharge status: Home / Self Care | Payer: Medicare PPO | Source: Ambulatory Visit | Attending: Interventional Radiology | Admitting: Interventional Radiology

## 2023-05-14 DIAGNOSIS — C7952 Secondary malignant neoplasm of bone marrow: Secondary | ICD-10-CM | POA: Insufficient documentation

## 2023-05-14 DIAGNOSIS — M48061 Spinal stenosis, lumbar region without neurogenic claudication: Secondary | ICD-10-CM | POA: Insufficient documentation

## 2023-05-14 DIAGNOSIS — C7951 Secondary malignant neoplasm of bone: Secondary | ICD-10-CM | POA: Insufficient documentation

## 2023-05-14 DIAGNOSIS — C50919 Malignant neoplasm of unspecified site of unspecified female breast: Secondary | ICD-10-CM | POA: Diagnosis not present

## 2023-05-14 DIAGNOSIS — Z87891 Personal history of nicotine dependence: Secondary | ICD-10-CM | POA: Insufficient documentation

## 2023-05-14 DIAGNOSIS — M545 Low back pain, unspecified: Secondary | ICD-10-CM | POA: Diagnosis not present

## 2023-05-14 DIAGNOSIS — Z9013 Acquired absence of bilateral breasts and nipples: Secondary | ICD-10-CM | POA: Insufficient documentation

## 2023-05-14 DIAGNOSIS — Z853 Personal history of malignant neoplasm of breast: Secondary | ICD-10-CM | POA: Insufficient documentation

## 2023-05-14 DIAGNOSIS — G8929 Other chronic pain: Secondary | ICD-10-CM | POA: Insufficient documentation

## 2023-05-14 HISTORY — PX: IR KYPHO LUMBAR INC FX REDUCE BONE BX UNI/BIL CANNULATION INC/IMAGING: IMG5519

## 2023-05-14 LAB — CBC WITH DIFFERENTIAL/PLATELET
Abs Immature Granulocytes: 0.02 10*3/uL (ref 0.00–0.07)
Basophils Absolute: 0.1 10*3/uL (ref 0.0–0.1)
Basophils Relative: 1 %
Eosinophils Absolute: 0.2 10*3/uL (ref 0.0–0.5)
Eosinophils Relative: 3 %
HCT: 44.4 % (ref 36.0–46.0)
Hemoglobin: 14.8 g/dL (ref 12.0–15.0)
Immature Granulocytes: 0 %
Lymphocytes Relative: 28 %
Lymphs Abs: 1.6 10*3/uL (ref 0.7–4.0)
MCH: 29.6 pg (ref 26.0–34.0)
MCHC: 33.3 g/dL (ref 30.0–36.0)
MCV: 88.8 fL (ref 80.0–100.0)
Monocytes Absolute: 0.6 10*3/uL (ref 0.1–1.0)
Monocytes Relative: 10 %
Neutro Abs: 3.2 10*3/uL (ref 1.7–7.7)
Neutrophils Relative %: 58 %
Platelets: 235 10*3/uL (ref 150–400)
RBC: 5 MIL/uL (ref 3.87–5.11)
RDW: 12.1 % (ref 11.5–15.5)
WBC: 5.6 10*3/uL (ref 4.0–10.5)
nRBC: 0 % (ref 0.0–0.2)

## 2023-05-14 LAB — BASIC METABOLIC PANEL
Anion gap: 8 (ref 5–15)
BUN: 17 mg/dL (ref 8–23)
CO2: 23 mmol/L (ref 22–32)
Calcium: 9.1 mg/dL (ref 8.9–10.3)
Chloride: 107 mmol/L (ref 98–111)
Creatinine, Ser: 0.94 mg/dL (ref 0.44–1.00)
GFR, Estimated: >60 mL/min (ref >60)
Glucose, Bld: 108 mg/dL — ABNORMAL HIGH (ref 70–99)
Potassium: 3.9 mmol/L (ref 3.5–5.1)
Sodium: 138 mmol/L (ref 135–145)

## 2023-05-14 LAB — PROTIME-INR
INR: 1 (ref 0.8–1.2)
Prothrombin Time: 13.3 seconds (ref 11.4–15.2)

## 2023-05-14 MED ORDER — MIDAZOLAM HCL 2 MG/2ML IJ SOLN
INTRAMUSCULAR | Status: AC
  Filled 2023-05-14: qty 2

## 2023-05-14 MED ORDER — FENTANYL CITRATE (PF) 100 MCG/2ML IJ SOLN
INTRAMUSCULAR | Status: AC | PRN
  Administered 2023-05-14 (×2): 25 ug via INTRAVENOUS
  Administered 2023-05-14 (×2): 50 ug via INTRAVENOUS

## 2023-05-14 MED ORDER — BUPIVACAINE HCL (PF) 0.5 % IJ SOLN
20.0000 mL | Freq: Once | INTRAMUSCULAR | Status: AC
  Administered 2023-05-14: 20 mL

## 2023-05-14 MED ORDER — SODIUM CHLORIDE 0.9 % IV SOLN: INTRAVENOUS | Status: DC

## 2023-05-14 MED ORDER — KETOROLAC TROMETHAMINE 30 MG/ML IJ SOLN
INTRAMUSCULAR | Status: AC
  Filled 2023-05-14: qty 1

## 2023-05-14 MED ORDER — MIDAZOLAM HCL 2 MG/2ML IJ SOLN
INTRAMUSCULAR | Status: AC | PRN
  Administered 2023-05-14 (×3): 1 mg via INTRAVENOUS

## 2023-05-14 MED ORDER — KETOROLAC TROMETHAMINE 30 MG/ML IJ SOLN
INTRAMUSCULAR | Status: AC | PRN
  Administered 2023-05-14 (×2): 15 mg via INTRAVENOUS

## 2023-05-14 MED ORDER — CEFAZOLIN SODIUM-DEXTROSE 2-4 GM/100ML-% IV SOLN
INTRAVENOUS | Status: AC
  Filled 2023-05-14: qty 100

## 2023-05-14 MED ORDER — BUPIVACAINE HCL (PF) 0.5 % IJ SOLN
INTRAMUSCULAR | Status: AC
  Filled 2023-05-14: qty 30

## 2023-05-14 MED ORDER — LIDOCAINE HCL (PF) 1 % IJ SOLN
20.0000 mL | Freq: Once | INTRAMUSCULAR | Status: AC
  Administered 2023-05-14: 20 mL via INTRADERMAL

## 2023-05-14 MED ORDER — FENTANYL CITRATE (PF) 100 MCG/2ML IJ SOLN
INTRAMUSCULAR | Status: AC
  Filled 2023-05-14: qty 2

## 2023-05-14 MED ORDER — HYDROCODONE-ACETAMINOPHEN 5-325 MG PO TABS
1.0000 | ORAL_TABLET | ORAL | Status: DC | PRN
  Filled 2023-05-14: qty 2

## 2023-05-14 MED ORDER — CEFAZOLIN SODIUM-DEXTROSE 2-4 GM/100ML-% IV SOLN
2.0000 g | INTRAVENOUS | Status: AC
  Administered 2023-05-14: 2 g via INTRAVENOUS

## 2023-05-14 MED ORDER — DIPHENHYDRAMINE HCL 50 MG/ML IJ SOLN
INTRAMUSCULAR | Status: AC
  Filled 2023-05-14: qty 1

## 2023-05-14 MED ORDER — LIDOCAINE HCL (PF) 1 % IJ SOLN
INTRAMUSCULAR | Status: AC
  Filled 2023-05-14: qty 30

## 2023-05-14 MED ORDER — TRAMADOL HCL 50 MG PO TABS: 50.0000 mg | ORAL_TABLET | Freq: Four times a day (QID) | ORAL | 0 refills | Status: DC | PRN

## 2023-05-14 MED ORDER — DIPHENHYDRAMINE HCL 50 MG/ML IJ SOLN
INTRAMUSCULAR | Status: AC | PRN
  Administered 2023-05-14: 50 mg via INTRAVENOUS

## 2023-05-14 NOTE — Procedures (Addendum)
Vascular and Interventional Radiology Procedure Note  Patient: Megan Vega DOB: [birth year] Medical Record Number: [id number] Note Date/Time: 05/14/23 1:49 PM   Performing Physician: Roanna Banning, MD Assistant(s): None  Diagnosis: Symptomatic L5 lesion. HM on PET. Hx breast CA  Procedure:  L5 BONE BIOPSY, PERCUTANEOUS RADIOFREQUENCY "OsteoCool" ABLATION  and VERTEBRAL BODY KYPHOPLASTY   Anesthesia: Conscious Sedation Complications: None Estimated Blood Loss: Minimal Specimens: Sent for Pathology   Findings:  Successful Fluoroscopy-guided L5 vertebral body biopsy, "OsteoCool" RFA and bipedicular kyphoplasty. A total of 6 mL PMMA was used. Hemostasis of the tract was achieved using Manual Pressure.   Plan: Bed rest for 1 hours.  See detailed procedure note with images in PACS. The patient tolerated the procedure well without incident or complication and was returned to Recovery in stable condition.    Roanna Banning, MD [facility]iology [facility] Radiology   Pager. [phone] Clinic. [phone]

## 2023-05-14 NOTE — Sedation Documentation (Signed)
Wiggles toes to command

## 2023-05-15 ENCOUNTER — Telehealth (HOSPITAL_COMMUNITY): Payer: Self-pay | Admitting: Interventional Radiology

## 2023-05-15 NOTE — Progress Notes (Signed)
Vascular and Interventional Radiology  Phone Note  Patient: Megan Vega DOB: [birth year] Medical Record Number: [id number] Note Date/Time: 05/15/23 1:10 PM   Diagnosis: Hx breast CA. L5 solitary osseous spinal metastatic lesion    I identified myself to the patient and conveyed my credentials to Ms.[name] For medical emergencies, Pt was advised to call 911 or go to the nearest emergency room.   Assessment  Plan: 67 y.o. year old female POD 1 s/p L5 Bx, 'OsteoCool' RFA and BKP . VIR reached out in courtesy follow-up.  Pt reports that they are doing well. Post procedural discomfort yesterday, well controlled with Rxs.  No pain today. She's up and walking. No concern at this time.    Follow up Medical Oncology follow up w Dr Smith Robert.  Pt to follow up with me in Clinic within 1 month post op.   As part of this Telephone encounter, no in-person exam was conducted.  The patient was physically located in [place] or a state in which I am permitted to provide care. The encounter was reasonable and appropriate under the circumstances given the patient's presentation at the time.   Roanna Banning, MD [facility] Interventional Radiology Specialists [facility] Radiology   Pager. [phone] Clinic. [phone]

## 2023-05-29 ENCOUNTER — Ambulatory Visit: Payer: Medicare PPO | Admitting: Oncology

## 2023-05-29 ENCOUNTER — Institutional Professional Consult (permissible substitution): Payer: Medicare PPO | Admitting: Radiation Oncology

## 2023-06-02 ENCOUNTER — Encounter: Payer: Self-pay | Admitting: Oncology

## 2023-06-02 ENCOUNTER — Inpatient Hospital Stay (HOSPITAL_BASED_OUTPATIENT_CLINIC_OR_DEPARTMENT_OTHER): Payer: Medicare PPO | Admitting: Oncology

## 2023-06-02 ENCOUNTER — Telehealth: Payer: Self-pay

## 2023-06-02 ENCOUNTER — Other Ambulatory Visit (HOSPITAL_COMMUNITY): Payer: Self-pay

## 2023-06-02 ENCOUNTER — Ambulatory Visit
Admission: RE | Admit: 2023-06-02 | Discharge: 2023-06-02 | Disposition: A | Discharge status: Home / Self Care | Payer: Medicare PPO | Source: Ambulatory Visit | Attending: Radiation Oncology | Admitting: Radiation Oncology

## 2023-06-02 ENCOUNTER — Encounter: Payer: Self-pay | Admitting: Hematology and Oncology

## 2023-06-02 ENCOUNTER — Other Ambulatory Visit: Payer: Self-pay

## 2023-06-02 ENCOUNTER — Inpatient Hospital Stay: Payer: Medicare PPO | Admitting: Pharmacist

## 2023-06-02 ENCOUNTER — Inpatient Hospital Stay: Payer: Medicare PPO

## 2023-06-02 VITALS — BP 114/83 | HR 83 | Temp 97.5°F | Wt 155.3 lb

## 2023-06-02 DIAGNOSIS — Z801 Family history of malignant neoplasm of trachea, bronchus and lung: Secondary | ICD-10-CM | POA: Insufficient documentation

## 2023-06-02 DIAGNOSIS — C7951 Secondary malignant neoplasm of bone: Secondary | ICD-10-CM | POA: Insufficient documentation

## 2023-06-02 DIAGNOSIS — Z79899 Other long term (current) drug therapy: Secondary | ICD-10-CM | POA: Insufficient documentation

## 2023-06-02 DIAGNOSIS — C50411 Malignant neoplasm of upper-outer quadrant of right female breast: Secondary | ICD-10-CM | POA: Insufficient documentation

## 2023-06-02 DIAGNOSIS — Z800 Family history of malignant neoplasm of digestive organs: Secondary | ICD-10-CM | POA: Insufficient documentation

## 2023-06-02 DIAGNOSIS — Z9221 Personal history of antineoplastic chemotherapy: Secondary | ICD-10-CM | POA: Insufficient documentation

## 2023-06-02 DIAGNOSIS — Z79811 Long term (current) use of aromatase inhibitors: Secondary | ICD-10-CM | POA: Insufficient documentation

## 2023-06-02 DIAGNOSIS — Z510 Encounter for antineoplastic radiation therapy: Secondary | ICD-10-CM | POA: Insufficient documentation

## 2023-06-02 DIAGNOSIS — C50911 Malignant neoplasm of unspecified site of right female breast: Secondary | ICD-10-CM

## 2023-06-02 DIAGNOSIS — Z170 Estrogen receptor positive status [ER+]: Secondary | ICD-10-CM | POA: Insufficient documentation

## 2023-06-02 DIAGNOSIS — Z9013 Acquired absence of bilateral breasts and nipples: Secondary | ICD-10-CM | POA: Insufficient documentation

## 2023-06-02 DIAGNOSIS — Z79818 Long term (current) use of other agents affecting estrogen receptors and estrogen levels: Secondary | ICD-10-CM | POA: Insufficient documentation

## 2023-06-02 DIAGNOSIS — Z85828 Personal history of other malignant neoplasm of skin: Secondary | ICD-10-CM | POA: Insufficient documentation

## 2023-06-02 DIAGNOSIS — Z87891 Personal history of nicotine dependence: Secondary | ICD-10-CM | POA: Insufficient documentation

## 2023-06-02 DIAGNOSIS — C7931 Secondary malignant neoplasm of brain: Secondary | ICD-10-CM | POA: Insufficient documentation

## 2023-06-02 DIAGNOSIS — Z809 Family history of malignant neoplasm, unspecified: Secondary | ICD-10-CM | POA: Insufficient documentation

## 2023-06-02 DIAGNOSIS — C50912 Malignant neoplasm of unspecified site of left female breast: Secondary | ICD-10-CM

## 2023-06-02 DIAGNOSIS — Z923 Personal history of irradiation: Secondary | ICD-10-CM | POA: Insufficient documentation

## 2023-06-02 DIAGNOSIS — Z8614 Personal history of Methicillin resistant Staphylococcus aureus infection: Secondary | ICD-10-CM | POA: Insufficient documentation

## 2023-06-02 LAB — CBC WITH DIFFERENTIAL/PLATELET
Abs Immature Granulocytes: 0.02 10*3/uL (ref 0.00–0.07)
Basophils Absolute: 0.1 10*3/uL (ref 0.0–0.1)
Basophils Relative: 1 %
Eosinophils Absolute: 0.1 10*3/uL (ref 0.0–0.5)
Eosinophils Relative: 2 %
HCT: 44.4 % (ref 36.0–46.0)
Hemoglobin: 15.1 g/dL — ABNORMAL HIGH (ref 12.0–15.0)
Immature Granulocytes: 0 %
Lymphocytes Relative: 24 %
Lymphs Abs: 1.5 10*3/uL (ref 0.7–4.0)
MCH: 29.8 pg (ref 26.0–34.0)
MCHC: 34 g/dL (ref 30.0–36.0)
MCV: 87.6 fL (ref 80.0–100.0)
Monocytes Absolute: 0.5 10*3/uL (ref 0.1–1.0)
Monocytes Relative: 8 %
Neutro Abs: 4 10*3/uL (ref 1.7–7.7)
Neutrophils Relative %: 65 %
Platelets: 266 10*3/uL (ref 150–400)
RBC: 5.07 MIL/uL (ref 3.87–5.11)
RDW: 12.1 % (ref 11.5–15.5)
WBC: 6.2 10*3/uL (ref 4.0–10.5)
nRBC: 0 % (ref 0.0–0.2)

## 2023-06-02 LAB — COMPREHENSIVE METABOLIC PANEL
ALT: 15 U/L (ref 0–44)
AST: 23 U/L (ref 15–41)
Albumin: 4.6 g/dL (ref 3.5–5.0)
Alkaline Phosphatase: 67 U/L (ref 38–126)
Anion gap: 9 (ref 5–15)
BUN: 11 mg/dL (ref 8–23)
CO2: 26 mmol/L (ref 22–32)
Calcium: 9.5 mg/dL (ref 8.9–10.3)
Chloride: 102 mmol/L (ref 98–111)
Creatinine, Ser: 1.01 mg/dL — ABNORMAL HIGH (ref 0.44–1.00)
GFR, Estimated: >60 mL/min (ref >60)
Glucose, Bld: 94 mg/dL (ref 70–99)
Potassium: 4.7 mmol/L (ref 3.5–5.1)
Sodium: 137 mmol/L (ref 135–145)
Total Bilirubin: 0.7 mg/dL (ref 0.3–1.2)
Total Protein: 8 g/dL (ref 6.5–8.1)

## 2023-06-02 MED ORDER — PROCHLORPERAZINE MALEATE 10 MG PO TABS
10.0000 mg | ORAL_TABLET | Freq: Four times a day (QID) | ORAL | 1 refills | Status: AC | PRN
Start: 2023-06-02 — End: ?

## 2023-06-02 MED ORDER — ABEMACICLIB 150 MG PO TABS
150.0000 mg | ORAL_TABLET | Freq: Two times a day (BID) | ORAL | 0 refills | Status: DC
Start: 2023-06-02 — End: 2023-06-23
  Filled 2023-06-02 (×2): qty 56, 28d supply, fill #0

## 2023-06-02 MED ORDER — LOPERAMIDE HCL 2 MG PO CAPS
ORAL_CAPSULE | ORAL | 2 refills | Status: AC
Start: 2023-06-02 — End: ?

## 2023-06-02 MED ORDER — ONDANSETRON HCL 8 MG PO TABS
8.0000 mg | ORAL_TABLET | Freq: Three times a day (TID) | ORAL | 1 refills | Status: DC | PRN
Start: 2023-06-02 — End: 2023-06-05

## 2023-06-02 NOTE — Telephone Encounter (Signed)
Oral Oncology Patient Advocate Encounter  Prior Authorization for [name] has been approved.    PA# [id number]  Effective dates: 06/02/23 through 11/29/23  Patients co-pay is $100.00.  Obtained grant to bring co-pay to $0.00.    Ardeen Fillers, CPhT Oncology Pharmacy Patient Advocate  [facility]  [phone] (phone) [phone] (fax) 06/02/2023 2:06 PM

## 2023-06-02 NOTE — Consult Note (Signed)
NEW PATIENT EVALUATION  Name: Megan Vega  MRN: [id number]  Date:   06/02/2023     DOB: 02-29-1956   This 67 y.o. female patient presents to the clinic for initial evaluation of bone metastasis to L5 and patient previously treated for stage IIIb invasive mammary carcinoma of the right breast back in 2019.  REFERRING PHYSICIAN: Allegra Grana, FNP  CHIEF COMPLAINT:  Chief Complaint  Patient presents with   Consult    Metastatic disease to spine    DIAGNOSIS: The encounter diagnosis was Secondary malignant neoplasm of bone (HCC).   PREVIOUS INVESTIGATIONS:  Pathology report reviewed Clinical notes reviewed PET scan and CT scans reviewed   HPI: Patient is a 67 year old female well-known to our department having received radiation therapy to her right chest wall and peripheral lymphatics for stage IIIb invasive mammary carcinoma back in 2019.  Patient had also DCIS of the left breast underwent neoadjuvant chemotherapy followed by simple mastectomy of the left breast as well as a modified radical mastectomy of the right breast.  She has had breast reconstruction.  She started complaining of dull pain in her lower back MRI scan back in January 24 showed a 2.1 cm enhancing lesion in the L5 vertebral body.  Attempted biopsy at that time was nondiagnostic.  She continued to have pain in her lower back.  PET CT scan showed no identified L5 lesion hypermetabolic consistent with metastatic disease.  She underwent an L5 bone biopsy in June with radiofrequency osteocool ablation.  Biopsy at that time was positive for metastatic adenocarcinoma consistent with breast primary.  She is now referred to radiation oncology for consideration of treatment.  On PET scan there were no other lesions identified.  She is ambulating well still having some mild dull pain in her lower back.  PLANNED TREATMENT REGIMEN: SBRT treatment  PAST MEDICAL HISTORY:  has a past medical history of Ankle fracture, Breast  cancer (HCC), Cancer (HCC), Cataract, Family history of adverse reaction to anesthesia, Hemorrhoids, History of methicillin resistant staphylococcus aureus (MRSA) (2015), Migraines, Personal history of chemotherapy, Personal history of radiation therapy, and Wears glasses.    PAST SURGICAL HISTORY:  Past Surgical History:  Procedure Laterality Date   BREAST BIOPSY Bilateral 10/29/2017   L breast DCIS, R breast invasive mammary carcinoma of no special type, ER/PR+, Her2/neu 1+   BREAST RECONSTRUCTION Right 02/07/2020   Procedure: BREAST RECONSTRUCTION;  Surgeon: Peggye Form, DO;  Location: [facility];  Service: Plastics;  Laterality: Right;   BREAST RECONSTRUCTION WITH PLACEMENT OF TISSUE EXPANDER AND FLEX HD (ACELLULAR HYDRATED DERMIS) Bilateral 08/30/2019   Procedure: BREAST RECONSTRUCTION WITH PLACEMENT OF TISSUE EXPANDER AND FLEX HD (ACELLULAR HYDRATED DERMIS);  Surgeon: Peggye Form, DO;  Location: [facility];  Service: Plastics;  Laterality: Bilateral;  2.5 hours, please   CESAREAN SECTION     COLONOSCOPY  2012   COLONOSCOPY WITH PROPOFOL N/A 12/01/2018   Procedure: COLONOSCOPY WITH PROPOFOL;  Surgeon: Midge Minium, MD;  Location: [facility];  Service: Endoscopy;  Laterality: N/A;   DEBRIDEMENT AND CLOSURE WOUND Bilateral 10/02/2020   Procedure: Excision of bilateral breast wound;  Surgeon: Peggye Form, DO;  Location: [facility];  Service: Plastics;  Laterality: Bilateral;  1.5 hours, please   FRACTURE SURGERY     ankle   IR CT BONE TROCAR/NEEDLE BIOPSY DEEP  12/26/2022   IR KYPHO LUMBAR INC FX REDUCE BONE BX UNI/BIL CANNULATION INC/IMAGING  05/14/2023   IR RADIOLOGIST EVAL & MGMT  04/28/2023   IRRIGATION AND DEBRIDEMENT ABSCESS Right 03/12/2018   Procedure: IRRIGATION AND DEBRIDEMENT RIGHT AXILLARY ABSCESS;  Surgeon: Earline Mayotte, MD;  Location: [facility];  Service: General;  Laterality: Right;   LATISSIMUS FLAP TO BREAST Right 02/07/2020   Procedure:  LATISSIMUS MYOCUTANEOUS FLAP TO BREAST;  Surgeon: Peggye Form, DO;  Location: [facility];  Service: Plastics;  Laterality: Right;  3 hours   MANDIBLE FRACTURE SURGERY     MASTECTOMY     MASTECTOMY MODIFIED RADICAL Right 07/10/2018   ypT2 ypN2a ER/PR positive, HER-2/neu negative  Surgeon: Earline Mayotte, MD;  Location: [facility];  Service: General;  Laterality: Right;   PORT-A-CATH REMOVAL Left 02/07/2020   Procedure: REMOVAL PORT-A-CATH;  Surgeon: Peggye Form, DO;  Location: [facility];  Service: Plastics;  Laterality: Left;   PORTACATH PLACEMENT Left 11/28/2017   Procedure: INSERTION PORT-A-CATH;  Surgeon: Earline Mayotte, MD;  Location: [facility];  Service: General;  Laterality: Left;   REMOVAL OF TISSUE EXPANDER Right 06/05/2020   Procedure: TISSUE EXPANDER REMOVAL;  Surgeon: Peggye Form, DO;  Location: [facility];  Service: Plastics;  Laterality: Right;   SIMPLE MASTECTOMY WITH AXILLARY SENTINEL NODE BIOPSY Left 07/10/2018   High-grade DCIS SIMPLE MASTECTOMY;  Surgeon: Earline Mayotte, MD;  Location: [facility];  Service: General;  Laterality: Left;   TISSUE EXPANDER PLACEMENT Left 06/05/2020   Procedure: TISSUE EXPANDER FLUID REMOVAL-100ML;  Surgeon: Peggye Form, DO;  Location: [facility];  Service: Plastics;  Laterality: Left;   TISSUE EXPANDER PLACEMENT Left 06/14/2020   Procedure: TISSUE EXPANDER REMOVAL;  Surgeon: Peggye Form, DO;  Location: [facility];  Service: Plastics;  Laterality: Left;  45 min, please    FAMILY HISTORY: family history includes Cancer in her father and mother; Diabetes in her brother and father; Early death in her mother; Heart attack in her paternal grandfather.  SOCIAL HISTORY:  reports that she quit smoking about 46 years ago. Her smoking use included cigarettes. She started smoking about 56 years ago. She has a 2.5 pack-year smoking history. She has never used smokeless tobacco. She reports current alcohol use of about 1.0 standard  drink of alcohol per week. She reports that she does not use drugs.  ALLERGIES: Peanut-containing drug products  MEDICATIONS:  Current Outpatient Medications  Medication Sig Dispense Refill   abemaciclib (VERZENIO) 150 MG tablet Take 1 tablet (150 mg total) by mouth 2 (two) times daily. 56 tablet 0   b complex vitamins tablet Take 1 tablet by mouth daily.     Calcium-Magnesium-Vitamin D (CALCIUM 1200+D3 PO) Take 1,200 mg by mouth daily.      letrozole (FEMARA) 2.5 MG tablet Take 1 tablet (2.5 mg total) by mouth daily. 90 tablet 1   loratadine (CLARITIN) 10 MG tablet Take 10 mg by mouth every morning.      Melatonin 5 MG CAPS Take 5 mg by mouth daily as needed (sleep).      Multiple Vitamin (MULTIVITAMIN WITH MINERALS) TABS tablet Take 1 tablet by mouth daily. Centrum Silver     Naphazoline HCl (CLEAR EYES OP) Place 1 drop into both eyes daily.     Probiotic Product (PROBIOTIC PO) Take 1 capsule by mouth daily.     traMADol (ULTRAM) 50 MG tablet Take 1 tablet (50 mg total) by mouth every 6 (six) hours as needed for moderate pain. 10 tablet 0   No current facility-administered medications for this encounter.   Facility-Administered Medications Ordered in Other Encounters  Medication Dose Route Frequency Provider Last Rate Last Admin   heparin lock flush 100 unit/mL  500 Units Intracatheter PRN Corcoran, Ferdie Ping, MD        ECOG PERFORMANCE STATUS:  1 - Symptomatic but completely ambulatory  REVIEW OF SYSTEMS: Patient denies any weight loss, fatigue, weakness, fever, chills or night sweats. Patient denies any loss of vision, blurred vision. Patient denies any ringing  of the ears or hearing loss. No irregular heartbeat. Patient denies heart murmur or history of fainting. Patient denies any chest pain or pain radiating to her upper extremities. Patient denies any shortness of breath, difficulty breathing at night, cough or hemoptysis. Patient denies any swelling in the lower legs. Patient  denies any nausea vomiting, vomiting of blood, or coffee ground material in the vomitus. Patient denies any stomach pain. Patient states has had normal bowel movements no significant constipation or diarrhea. Patient denies any dysuria, hematuria or significant nocturia. Patient denies any problems walking, swelling in the joints or loss of balance. Patient denies any skin changes, loss of hair or loss of weight. Patient denies any excessive worrying or anxiety or significant depression. Patient denies any problems with insomnia. Patient denies excessive thirst, polyuria, polydipsia. Patient denies any swollen glands, patient denies easy bruising or easy bleeding. Patient denies any recent infections, allergies or URI. Patient "s visual fields have not changed significantly in recent time.   PHYSICAL EXAM: LMP  (LMP Unknown)  Deep palpation of the lower spine does not elicit pain motor and sensory levels are equal and symmetric in the lower extremities.  Range of motion of the lower extremities does not elicit pain.  Well-developed well-nourished patient in NAD. HEENT reveals PERLA, EOMI, discs not visualized.  Oral cavity is clear. No oral mucosal lesions are identified. Neck is clear without evidence of cervical or supraclavicular adenopathy. Lungs are clear to A&P. Cardiac examination is essentially unremarkable with regular rate and rhythm without murmur rub or thrill. Abdomen is benign with no organomegaly or masses noted. Motor sensory and DTR levels are equal and symmetric in the upper and lower extremities. Cranial nerves II through XII are grossly intact. Proprioception is intact. No peripheral adenopathy or edema is identified. No motor or sensory levels are noted. Crude visual fields are within normal range.  LABORATORY DATA: Pathology report reviewed    RADIOLOGY RESULTS: CT scans PET CT scans reviewed compatible with above-stated findings   IMPRESSION: Solitary metastatic site and bone and  patient with known stage IV breast cancer status post osteocool.  In 66 year old female  PLAN: This time is go with SBRT to the area of L5 involvement.  Would plan on delivering 30 Gray in 5 fractions.  Extremely low side effect profile for SBRT was explained to the patient.  She may have some slight diarrhea fatigue and slight chance of increased lower urinary tract symptoms.  I have personally 7 ordered CT simulation.  Patient comprehends my recommendations well.  I would like to take this opportunity to thank you for allowing me to participate in the care of your patient.Carmina Miller, MD

## 2023-06-02 NOTE — Telephone Encounter (Signed)
Patient successfully OnBoarded and drug education provided by pharmacist. Medication scheduled to be shipped on 06/03/23 for delivery on 06/04/23 from [facility] to patient's address. Patient also knows to call me at [phone] with any questions or concerns regarding receiving medication or if there is any unexpected change in co-pay.    Ardeen Fillers, CPhT Oncology Pharmacy Patient Advocate  [facility]  [phone] (phone) [phone] (fax) 06/02/2023 2:35 PM

## 2023-06-02 NOTE — Telephone Encounter (Signed)
Oral Oncology Patient Advocate Encounter  New authorization   Received notification that prior authorization for Verzenio is required.   PA submitted on 06/02/23  Key [id number]  Status is pending     Ardeen Fillers, CPhT Oncology Pharmacy Patient Advocate  [facility]  [phone] (phone) [phone] (fax) 06/02/2023 11:19 AM

## 2023-06-02 NOTE — Telephone Encounter (Signed)
Oral Oncology Patient Advocate Encounter   Was successful in securing patient an $3,250.00 grant from [employer] [facility]) to provide copayment coverage for Verzenio.  This will keep the out of pocket expense at $0.      The billing information is as follows and has been shared with [facility].   Member ID: [id number] Group ID: [id number] RxBin: [id number] Dates of Eligibility: 03/03/23 through 05/30/24  Fund:  Breast Cancer   Ardeen Fillers, CPhT Oncology Pharmacy Patient Advocate  [facility]  [phone] (phone) [phone] (fax) 06/02/2023 11:22 AM

## 2023-06-02 NOTE — Progress Notes (Signed)
Clinical Pharmacist Practitioner Clinic [facility]  [phone] [phone]  Patient Care Team: Allegra Grana, FNP as PCP - General (Family Medicine) Jim Like, RN as Oncology Nurse Navigator Byrnett, Merrily Pew, MD (General Surgery) Glori Luis, MD as Consulting Physician (Family Medicine) Lonell Face, MD as Consulting Physician (Neurology) Creig Hines, MD as Consulting Physician (Oncology)   Name of the patient: Megan Vega  [id number]  1956/01/25   Date of visit: 06/02/23  HPI: Patient is a 67 y.o. female with metastatic ER/PR positive, HER2 negative breast cancer. Planned treatment with abemaciclib and fulvestrant.   Reason for Consult: Abemaciclib oral chemotherapy education.   PAST MEDICAL HISTORY: Past Medical History:  Diagnosis Date   Ankle fracture    Breast cancer (HCC)    bilateral   Cancer (HCC)    Basal Cell Carcinoma, about 2011 chest   Cataract    Family history of adverse reaction to anesthesia    daughter with PONV   Hemorrhoids    History of methicillin resistant staphylococcus aureus (MRSA) 2015   Migraines    MIGRAINES occ   Personal history of chemotherapy    Personal history of radiation therapy    Wears glasses     HEMATOLOGY/ONCOLOGY HISTORY:  Oncology History   No history exists.    ALLERGIES:  is allergic to peanut-containing drug products.  MEDICATIONS:  Current Outpatient Medications  Medication Sig Dispense Refill   b complex vitamins tablet Take 1 tablet by mouth daily.     Calcium-Magnesium-Vitamin D (CALCIUM 1200+D3 PO) Take 1,200 mg by mouth daily.      letrozole (FEMARA) 2.5 MG tablet Take 1 tablet (2.5 mg total) by mouth daily. 90 tablet 1   loratadine (CLARITIN) 10 MG tablet Take 10 mg by mouth every morning.      Melatonin 5 MG CAPS Take 5 mg by mouth daily as needed (sleep).      Multiple Vitamin (MULTIVITAMIN WITH MINERALS) TABS tablet Take 1 tablet by  mouth daily. Centrum Silver     Naphazoline HCl (CLEAR EYES OP) Place 1 drop into both eyes daily.     Probiotic Product (PROBIOTIC PO) Take 1 capsule by mouth daily.     traMADol (ULTRAM) 50 MG tablet Take 1 tablet (50 mg total) by mouth every 6 (six) hours as needed for moderate pain. 10 tablet 0   No current facility-administered medications for this visit.   Facility-Administered Medications Ordered in Other Visits  Medication Dose Route Frequency Provider Last Rate Last Admin   heparin lock flush 100 unit/mL  500 Units Intracatheter PRN Rosey Bath, MD        VITAL SIGNS: LMP  (LMP Unknown)  There were no vitals filed for this visit.  Estimated body mass index is 25.84 kg/m as calculated from the following:   Height as of 05/14/23: 5\' 5"  (1.651 m).   Weight as of an earlier encounter on 06/02/23: 70.4 kg (155 lb 4.8 oz).  LABS: CBC:    Component Value Date/Time   WBC 5.6 05/14/2023 1231   HGB 14.8 05/14/2023 1231   HCT 44.4 05/14/2023 1231   PLT 235 05/14/2023 1231   MCV 88.8 05/14/2023 1231   NEUTROABS 3.2 05/14/2023 1231   LYMPHSABS 1.6 05/14/2023 1231   MONOABS 0.6 05/14/2023 1231   EOSABS 0.2 05/14/2023 1231   BASOSABS 0.1 05/14/2023 1231   Comprehensive Metabolic Panel:    Component Value Date/Time   NA 138 05/14/2023 1231  K 3.9 05/14/2023 1231   CL 107 05/14/2023 1231   CO2 23 05/14/2023 1231   BUN 17 05/14/2023 1231   CREATININE 0.94 05/14/2023 1231   CREATININE 0.91 09/08/2020 1537   GLUCOSE 108 (H) 05/14/2023 1231   CALCIUM 9.1 05/14/2023 1231   AST 28 09/03/2022 1354   ALT 19 09/03/2022 1354   ALKPHOS 57 09/03/2022 1354   BILITOT 0.8 09/03/2022 1354   PROT 7.9 09/03/2022 1354   ALBUMIN 4.5 09/03/2022 1354     Present during today's visit: patient and her daughter  Start plan: Patient will start once she has abemaciclib in hand, likely this week   Patient Education I spoke with patient for overview of new oral chemotherapy medication:  abemaciclib   CMP/CBC from 06/02/23 assessed, until disease progression or unacceptable drug toxicity. Prescription dose and frequency assessed.   Administration: Counseled patient on administration, dosing, side effects, monitoring, drug-food interactions, safe handling, storage, and disposal. Patient will take 1 tablet (150 mg total) by mouth 2 (two) times daily.   Side Effects: Side effects include but not limited to: diarrhea, nausea, fatigue, decreased wbc/hgb/plt.    Drug-drug Interactions (DDI): No current DDIs with abemaciclib  Adherence: After discussion with patient no patient barriers to medication adherence identified.  Reviewed with patient importance of keeping a medication schedule and plan for any missed doses.  Megan Vega and her daughter voiced understanding and appreciation. All questions answered. Medication handout provided.  Provided patient with Oral Chemotherapy Navigation Clinic phone number. Patient knows to call the office with questions or concerns. Oral Chemotherapy Navigation Clinic will continue to follow.  Patient expressed understanding and was in agreement with this plan. She also understands that She can call clinic at any time with any questions, concerns, or complaints.   Medication Access Issues: PA pending, but medication grant obtained by Karna Dupes  Follow-up plan: RTC as scheduled  Thank you for allowing me to participate in the care of this patient.   Time Total: 20 mins  Visit consisted of counseling and education on dealing with issues of symptom management in the setting of serious and potentially life-threatening illness.Greater than 50%  of this time was spent counseling and coordinating care related to the above assessment and plan.  Signed by: Remi Haggard, PharmD, BCPS, Nolon Bussing, CPP Hematology/Oncology Clinical Pharmacist Practitioner [facility] [phone]  06/02/2023 10:20 AM

## 2023-06-02 NOTE — Progress Notes (Signed)
Hematology/Oncology Consult note [facility]  Telephone:([phone] [phone]  Patient Care Team: Allegra Grana, FNP as PCP - General (Family Medicine) Jim Like, RN as Oncology Nurse Navigator Byrnett, Merrily Pew, MD (General Surgery) Glori Luis, MD as Consulting Physician (Family Medicine) Lonell Face, MD as Consulting Physician (Neurology) Creig Hines, MD as Consulting Physician (Oncology)   Name of the patient: Megan Vega  [id number]  Sep 01, 1956   Date of visit: 06/02/23  Diagnosis-history of ER/PR positive HER2 negative breast cancer now with isolated L5 bone metastases stage III right breast cancer and left breast DCIS in 2019   Chief complaint/ Reason for visit-discuss pathology results and further management  Heme/Onc history: Megan Vega is a 67 y.o. female with clinical stage [id number] right breast cancer and DCIS in the left breast s/p neoadjuvant chemotherapy followed by left simple mastectomy and right modified radical mastectomy on 07/10/2018.  Biopsy showed 1.4 cm grade 2 invasive mammary carcinoma ER greater than 90% positive PR greater than 90% positive and HER2 negative +1   Patient received neoadjuvant AC Taxol chemotherapy until July 2019.   Left breast pathology revealed 2.8 cm residual high grade DCIS, comedo type.  There was atypical lobular hyperplasia.  Four sentinel lymph nodes were negative.  Right breast pathology revealed 3.6 cm residual grade II invasive mammary carcinoma and high grade DCIS.  There was lymphovascular invasion.There was metastatic carcinoma in 2 sentinel lymph nodes.  Right axillary dissection revealed metastatic carcinoma in 7 lymph nodes.  There were a total of 9 lymph nodes with macrometastasis (largest 22 mm).  Pathologic stage was ypT2 ypN2a.   Patient then received adjuvant radiation treatment and started letrozole in February 2020   She underwent right breast  reconstruction with latissimus myocutaneous flap and removal of her port-a-cath on 02/07/2020.  Expanders were taken out in 05/2020 secondary to infection.  She underwent excision of left breast wound with closure on 10/02/2020. Pathology revealed inflamed granulation tissue and fibrosis. There was no evidence of malignancy.  She received adjuvant Zometa for 3 years   Patient found to have L5 vertebral lesion that was hypermetabolic on PET scan.  Biopsies negative for malignancy.  There was no lesions seen elsewhere.  A repeat scans in May 2024 again confirmed the presence of L5 vertebral lesion and patient had a second biopsy with osteo-cool procedure/kyphoplasty.Biopsy was consistent with metastatic adenocarcinoma compatible with breast primary.  ER 100% positive, PR 5% positive and HER2 negative    Interval history-despite the osteochondral and kyphoplasty procedure patient still complains of some ongoing back pain which has not improved significantly.  Denies other complaints at this time  ECOG PS- 1 Pain scale- 3 Opioid associated constipation- no  Review of systems- Review of Systems  Constitutional:  Negative for chills, fever, malaise/fatigue and weight loss.  HENT:  Negative for congestion, ear discharge and nosebleeds.   Eyes:  Negative for blurred vision.  Respiratory:  Negative for cough, hemoptysis, sputum production, shortness of breath and wheezing.   Cardiovascular:  Negative for chest pain, palpitations, orthopnea and claudication.  Gastrointestinal:  Negative for abdominal pain, blood in stool, constipation, diarrhea, heartburn, melena, nausea and vomiting.  Genitourinary:  Negative for dysuria, flank pain, frequency, hematuria and urgency.  Musculoskeletal:  Positive for back pain. Negative for joint pain and myalgias.  Skin:  Negative for rash.  Neurological:  Negative for dizziness, tingling, focal weakness, seizures, weakness and headaches.  Endo/Heme/Allergies:  Does not  bruise/bleed easily.  Psychiatric/Behavioral:  Negative for depression and suicidal ideas. The patient does not have insomnia.       Allergies  Allergen Reactions   Peanut-Containing Drug Products Swelling and Other (See Comments)    Only when eating too many peanuts, swelling with lips and tingly face     Past Medical History:  Diagnosis Date   Ankle fracture    Breast cancer (HCC)    bilateral   Cancer (HCC)    Basal Cell Carcinoma, about 2011 chest   Cataract    Family history of adverse reaction to anesthesia    daughter with PONV   Hemorrhoids    History of methicillin resistant staphylococcus aureus (MRSA) 2015   Migraines    MIGRAINES occ   Personal history of chemotherapy    Personal history of radiation therapy    Wears glasses      Past Surgical History:  Procedure Laterality Date   BREAST BIOPSY Bilateral 10/29/2017   L breast DCIS, R breast invasive mammary carcinoma of no special type, ER/PR+, Her2/neu 1+   BREAST RECONSTRUCTION Right 02/07/2020   Procedure: BREAST RECONSTRUCTION;  Surgeon: Peggye Form, DO;  Location: [facility];  Service: Plastics;  Laterality: Right;   BREAST RECONSTRUCTION WITH PLACEMENT OF TISSUE EXPANDER AND FLEX HD (ACELLULAR HYDRATED DERMIS) Bilateral 08/30/2019   Procedure: BREAST RECONSTRUCTION WITH PLACEMENT OF TISSUE EXPANDER AND FLEX HD (ACELLULAR HYDRATED DERMIS);  Surgeon: Peggye Form, DO;  Location: [facility];  Service: Plastics;  Laterality: Bilateral;  2.5 hours, please   CESAREAN SECTION     COLONOSCOPY  2012   COLONOSCOPY WITH PROPOFOL N/A 12/01/2018   Procedure: COLONOSCOPY WITH PROPOFOL;  Surgeon: Midge Minium, MD;  Location[facility];  Service: Endoscopy;  Laterality: N/A;   DEBRIDEMENT AND CLOSURE WOUND Bilateral 10/02/2020   Procedure: Excision of bilateral breast wound;  Surgeon: Peggye Form, DO;  Location: [facility];  Service: Plastics;  Laterality: Bilateral;  1.5 hours,  please   FRACTURE SURGERY     ankle   IR CT BONE TROCAR/NEEDLE BIOPSY DEEP  12/26/2022   IR KYPHO LUMBAR INC FX REDUCE BONE BX UNI/BIL CANNULATION INC/IMAGING  05/14/2023   IR RADIOLOGIST EVAL & MGMT  04/28/2023   IRRIGATION AND DEBRIDEMENT ABSCESS Right 03/12/2018   Procedure: IRRIGATION AND DEBRIDEMENT RIGHT AXILLARY ABSCESS;  Surgeon: Earline Mayotte, MD;  Location: [facility];  Service: General;  Laterality: Right;   LATISSIMUS FLAP TO BREAST Right 02/07/2020   Procedure: LATISSIMUS MYOCUTANEOUS FLAP TO BREAST;  Surgeon: Peggye Form, DO;  Location: [facility];  Service: Plastics;  Laterality: Right;  3 hours   MANDIBLE FRACTURE SURGERY     MASTECTOMY     MASTECTOMY MODIFIED RADICAL Right 07/10/2018   ypT2 ypN2a ER/PR positive, HER-2/neu negative  Surgeon: Earline Mayotte, MD;  Location: [facility];  Service: General;  Laterality: Right;   PORT-A-CATH REMOVAL Left 02/07/2020   Procedure: REMOVAL PORT-A-CATH;  Surgeon: Peggye Form, DO;  Location: [facility];  Service: Plastics;  Laterality: Left;   PORTACATH PLACEMENT Left 11/28/2017   Procedure: INSERTION PORT-A-CATH;  Surgeon: Earline Mayotte, MD;  Location: [facility];  Service: General;  Laterality: Left;   REMOVAL OF TISSUE EXPANDER Right 06/05/2020   Procedure: TISSUE EXPANDER REMOVAL;  Surgeon: Peggye Form, DO;  Location: [facility];  Service: Plastics;  Laterality: Right;   SIMPLE MASTECTOMY WITH AXILLARY SENTINEL NODE BIOPSY Left 07/10/2018   High-grade DCIS SIMPLE MASTECTOMY;  Surgeon: Lemar Livings,  Merrily Pew, MD;  Location: [facility];  Service: General;  Laterality: Left;   TISSUE EXPANDER PLACEMENT Left 06/05/2020   Procedure: TISSUE EXPANDER FLUID REMOVAL-100ML;  Surgeon: Peggye Form, DO;  Location: [facility];  Service: Plastics;  Laterality: Left;   TISSUE EXPANDER PLACEMENT Left 06/14/2020   Procedure: TISSUE EXPANDER REMOVAL;  Surgeon: Peggye Form, DO;  Location: [facility];  Service: Plastics;  Laterality: Left;   45 min, please    Social History   Socioeconomic History   Marital status: Divorced    Spouse name: Not on file   Number of children: 2   Years of education: Not on file   Highest education level: Not on file  Occupational History   Occupation: Retired  Tobacco Use   Smoking status: Former    Current packs/day: 0.00    Average packs/day: 0.3 packs/day for 10.0 years (2.5 ttl pk-yrs)    Types: Cigarettes    Start date: 37    Quit date: 1978    Years since quitting: 46.5   Smokeless tobacco: Never  Vaping Use   Vaping status: Never Used  Substance and Sexual Activity   Alcohol use: Yes    Alcohol/week: 1.0 standard drink of alcohol    Types: 1 Glasses of wine per week    Comment: " occasional glass of wine "   Drug use: No   Sexual activity: Not Currently    Birth control/protection: Post-menopausal  Other Topics Concern   Not on file  Social History Narrative   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    Lives at home (son 37 yo lives with her)   Children 2   Pets: 3 small dogs and 1 frog    Caffeine- 1 cup of coffee in the morning, rare tea   Social Determinants of Health   Financial Resource Strain: Low Risk  (04/02/2023)   Overall Financial Resource Strain (CARDIA)    Difficulty of Paying Living Expenses: Not hard at all  Food Insecurity: No Food Insecurity (04/02/2023)   Hunger Vital Sign    Worried About Running Out of Food in the Last Year: Never true    Ran Out of Food in the Last Year: Never true  Transportation Needs: No Transportation Needs (04/02/2023)   PRAPARE - Administrator, Civil Service (Medical): No    Lack of Transportation (Non-Medical): No  Physical Activity: Sufficiently Active (04/02/2023)   Exercise Vital Sign    Days of Exercise per Week: 7 days    Minutes of Exercise per Session: 30 min  Stress: No Stress Concern Present (04/02/2023)   [employer] of Occupational Health - Occupational Stress Questionnaire    Feeling of  Stress : Not at all  Social Connections: Moderately Isolated (04/02/2023)   Social Connection and Isolation Panel [NHANES]    Frequency of Communication with Friends and Family: More than three times a week    Frequency of Social Gatherings with Friends and Family: Once a week    Attends Religious Services: More than 4 times per year    Active Member of [employer] or Organizations: No    Attends Banker Meetings: Never    Marital Status: Divorced  Catering manager Violence: Not At Risk (04/02/2023)   Humiliation, Afraid, Rape, and Kick questionnaire    Fear of Current or Ex-Partner: No    Emotionally Abused: No    Physically Abused: No    Sexually Abused: No    Family History  Problem Relation Age of Onset   Cancer Mother        Esophageal Cancer   Early death Mother        Age 69   Cancer Father        Lung Cancer   Diabetes Father    Diabetes Brother        Controlled with diet   Heart attack Paternal Grandfather    Colon cancer Neg Hx      Current Outpatient Medications:    b complex vitamins tablet, Take 1 tablet by mouth daily., Disp: , Rfl:    Calcium-Magnesium-Vitamin D (CALCIUM 1200+D3 PO), Take 1,200 mg by mouth daily. , Disp: , Rfl:    letrozole (FEMARA) 2.5 MG tablet, Take 1 tablet (2.5 mg total) by mouth daily., Disp: 90 tablet, Rfl: 1   loratadine (CLARITIN) 10 MG tablet, Take 10 mg by mouth every morning. , Disp: , Rfl:    Melatonin 5 MG CAPS, Take 5 mg by mouth daily as needed (sleep). , Disp: , Rfl:    Multiple Vitamin (MULTIVITAMIN WITH MINERALS) TABS tablet, Take 1 tablet by mouth daily. Centrum Silver, Disp: , Rfl:    Naphazoline HCl (CLEAR EYES OP), Place 1 drop into both eyes daily., Disp: , Rfl:    Probiotic Product (PROBIOTIC PO), Take 1 capsule by mouth daily., Disp: , Rfl:    traMADol (ULTRAM) 50 MG tablet, Take 1 tablet (50 mg total) by mouth every 6 (six) hours as needed for moderate pain., Disp: 10 tablet, Rfl: 0   abemaciclib (VERZENIO)  150 MG tablet, Take 1 tablet (150 mg total) by mouth 2 (two) times daily., Disp: 56 tablet, Rfl: 0   loperamide (IMODIUM) 2 MG capsule, Take 2 tabs by mouth with first loose stool, then 1 tab with each additional loose stool as needed. Do not exceed 8 tabs in a 24-hour period, Disp: 60 capsule, Rfl: 2   ondansetron (ZOFRAN) 8 MG tablet, Take 1 tablet (8 mg total) by mouth every 8 (eight) hours as needed for nausea or vomiting., Disp: 30 tablet, Rfl: 1   prochlorperazine (COMPAZINE) 10 MG tablet, Take 1 tablet (10 mg total) by mouth every 6 (six) hours as needed for nausea or vomiting., Disp: 30 tablet, Rfl: 1 No current facility-administered medications for this visit.  Facility-Administered Medications Ordered in Other Visits:    heparin lock flush 100 unit/mL, 500 Units, Intracatheter, PRN, Rosey Bath, MD  Physical exam:  Vitals:   06/02/23 0938  BP: 114/83  Pulse: 83  Temp: (!) 97.5 F (36.4 C)  TempSrc: Tympanic  SpO2: 98%  Weight: 155 lb 4.8 oz (70.4 kg)   Physical Exam Cardiovascular:     Rate and Rhythm: Normal rate and regular rhythm.     Heart sounds: Normal heart sounds.  Pulmonary:     Effort: Pulmonary effort is normal.     Breath sounds: Normal breath sounds.  Abdominal:     General: Bowel sounds are normal.     Palpations: Abdomen is soft.  Skin:    General: Skin is warm and dry.  Neurological:     Mental Status: She is alert and oriented to person, place, and time.         Latest Ref Rng & Units 06/02/2023   11:50 AM  CMP  Glucose 70 - 99 mg/dL 94   BUN 8 - 23 mg/dL 11   Creatinine 1.30 - 1.00 mg/dL 8.65   Sodium 784 - 696 mmol/L 137  Potassium 3.5 - 5.1 mmol/L 4.7   Chloride 98 - 111 mmol/L 102   CO2 22 - 32 mmol/L 26   Calcium 8.9 - 10.3 mg/dL 9.5   Total Protein 6.5 - 8.1 g/dL 8.0   Total Bilirubin 0.3 - 1.2 mg/dL 0.7   Alkaline Phos 38 - 126 U/L 67   AST 15 - 41 U/L 23   ALT 0 - 44 U/L 15       Latest Ref Rng & Units 06/02/2023    11:50 AM  CBC  WBC 4.0 - 10.5 K/uL 6.2   Hemoglobin 12.0 - 15.0 g/dL 29.5   Hematocrit 62.1 - 46.0 % 44.4   Platelets 150 - 400 K/uL 266       IR KYPHO LUMBAR INC FX REDUCE BONE BX UNI/BIL CANNULATION INC/IMAGING  Result Date: 05/14/2023 CLINICAL DATA:  L5 lesion Briefly, 67 year old female with a history of breast cancer with a symptomatic, solitary osseous (oligo-) metastasis at L5. EXAM: L5 VERTEBRAL BODY BIOPSY, RADIOFREQUENCY (OsteoCool) ABLATION and KYPHOPLASTY AUGMENTATION COMPARISON:  CT CAP, 04/18/2023.  PET-CT, 12/13/2022. MEDICATIONS: Ancef 2 g IV; The antibiotic was administered in an appropriate time interval prior to needle puncture of the skin. 20 mL of 0.5% bupivacaine IM within the access level paraspinal muscles. Toradol 30 mg IV. 50 mg Benadryl IV. ANESTHESIA/SEDATION: Moderate (conscious) sedation was employed during this procedure. A total of Versed 3 mg and Fentanyl 150 mcg was administered intravenously. Moderate Sedation Time: 70 minutes. The patient's level of consciousness and vital signs were monitored continuously by radiology nursing throughout the procedure under my direct supervision. FLUOROSCOPY TIME:  Fluoroscopic dose; 459 mGy COMPLICATIONS: None immediate. TECHNIQUE: Informed written consent was obtained from the patient after a thorough discussion of the procedural risks, benefits and alternatives. All questions were addressed. Maximal Sterile Barrier Technique was utilized including caps, mask, sterile gowns, sterile gloves, sterile drape, hand hygiene and skin antiseptic. A timeout was performed prior to the initiation of the procedure. The patient was placed prone on the fluoroscopic table. The skin overlying the lumbar spine region was then prepped and draped in the usual sterile fashion. Maximal barrier sterile technique was utilized including caps, mask, sterile gowns, sterile gloves, sterile drape, hand hygiene and skin antiseptic. The RIGHT pedicle at L5 was  then infiltrated with 1% lidocaine and 0.5% bupivacaine followed by the advancement of a Kyphon trocar needle through the RIGHT pedicle into the posterior one-third of the vertebral body. Bone biopsy was performed. Subsequently, the osteo drill was advanced to the anterior third of the vertebral body. The osteo drill was retracted. Through the working cannula, a 15 mm OsteoCool RF ablation probe was inserted and positioned under fluoroscopic guidance. In similar fashion, the LEFT L5 pedicle was infiltrated with 1% lidocaine and 0.5% bupivacaine. A second Kyphon trocar needle was advanced into the posterior third of the vertebral body. Bone biopsy was also performed at the side, then the osteo drill was coaxially advanced to the anterior third. The osteo drill was exchanged for a 15 mm OsteoCool RF ablation probe which was positioned under fluoroscopic guidance. With both ablation probes in place, the ablation was performed for 7.5 minutes. Attention was now paid towards the kyphoplasty portion of the procedure. A Kyphon inflatable bone tamp 15 x 3 was advanced through both working cannulas and positioned with the distal marker approximately 5 mm from the anterior aspect of the cortex. Appropriate positioning was confirmed on the AP projection. At this time, the balloon was expanded  using contrast via a Kyphon inflation syringe device via micro tubing. Inflations were continued under direct fluoroscopic guidance. At this time, methylmethacrylate mixture was reconstituted in the Kyphon bone mixing device system. This was then loaded into the delivery mechanism, attached to Kyphon bone fillers. The balloons were deflated and removed followed by the instillation of methylmethacrylate mixture with excellent filling in the AP and lateral projections. The working cannulae and the bone filler were then retrieved and removed. Multiple spot radiographic images were obtained in various obliquities. Hemostasis was achieved with  manual compression. The patient tolerated the procedure well without immediate postprocedural complication. FINDINGS: *Bipedicular L5 bone biopsy with adequate tissue retrieval. This was sent in formalin to pathology for analysis. *Adequate cement filling of the L5 vertebral body on both the AP and lateral projections. *No extravasation was noted in the disk spaces or posteriorly into the spinal canal or along the pedicular access. *Trace epidural venous contamination was present. IMPRESSION: Successful L5 vertebral body biopsy, radiofrequency "OsteoCool" ablation and bi-pedicular cement augmentation with balloon kyphoplasty. PLAN: The patient will be referred back to her oncologist team for radiation therapy. She will be seen by me in the Vascular Interventional Radiology (VIR) clinic for follow-up in 4 weeks. Roanna Banning, MD [facility] Radiology Electronically Signed   By: Roanna Banning M.D.   On: 05/14/2023 16:15     Assessment and plan- Patient is a 67 y.o. female with history of stage III left breast cancer in 2019 now presenting with isolated L5 metastatic lesion  Patient had a CT chest abdomen pelvis with contrast in May 2024 which did not show any other evidence of metastatic disease other than the L5 vertebral region.  Prior to that her PET scan in January 2024 also did not show other areas of metastatic disease.  Patient underwent osteoporotic procedure and kyphoplasty and had a second biopsy which was consistent with breast primary lesion.  Tumor was ER 100% positive, PR 5% positive and HER2 negative.  I would recommend proceeding with radiation therapy for her L5 lesion at this time.  Patient had progression while on letrozole endocrine therapy.  I would therefore like to switch her to fulvestrant injections along with CDK inhibitor.  I will start with Verzenio 150 mg twice daily until progression or toxicity.  Discussed risks and benefits of  fulvestrant including all but not limited to hot flashes, mood swings, arthralgias.  Discussed risks and benefits of Verzenio including all but not limited to low blood counts, risk of infections nausea vomiting diarrhea and abnormal LFTs.  We will be working on Therapist, occupational and hopefully should have the drug within 1 week.  Patient will continue to take letrozole until she can come here for her first dose of Faslodex.  Once she starts Faslodex she should stop her letrozole altogether.  Treatment will be given with a palliative intent.  Patient verbalized understanding of the plan. In a [place] Food and Drug Administration pooled analysis including three trials, among patients who were assigned to fulvestrant/CDK 4/6 inhibitors or fulvestrant/placebo as second-line or later-line ET, the CDK 4/6 inhibitor group experienced an improvement in overall survival (HR 0.77, 95% CI 067-089) [32]. The difference in estimated median overall survival was seven months, favoring CDK inhibitors.   I will continue to monitor her CBC and CMP every 2 weeks for the next 2 months and see her back in 1 month   Visit Diagnosis 1. Bilateral malignant neoplasm of breast in female, estrogen  receptor positive, unspecified site of breast (HCC)   2. Primary cancer of upper outer quadrant of right breast (HCC)      Dr. Owens Shark, MD, MPH [facility] [phone] 06/02/2023 1:05 PM

## 2023-06-02 NOTE — Progress Notes (Signed)
DISCONTINUE ON PATHWAY REGIMEN - Breast   Dose-Dense AC q14 days:   A cycle is every 14 days:     Doxorubicin      Cyclophosphamide      [id number]    Paclitaxel 80 mg/m2 Weekly:   Administer weekly:     Paclitaxel   **Always confirm dose/schedule in your pharmacy ordering system**  REASON: Disease Progression PRIOR TREATMENT: [id number]: Dose-Dense AC-T (Paclitaxel Weekly) - [Doxorubicin + Cyclophosphamide q14 Days x 4 Cycles, Followed by Paclitaxel 80 mg/m2 Weekly x 12 Weeks] TREATMENT RESPONSE: Progressive Disease (PD)  START ON PATHWAY REGIMEN - Breast     Cycle 1: A cycle is 28 days:     Abemaciclib      Fulvestrant    Cycles 2 and beyond: A cycle is every 28 days:     Abemaciclib      Fulvestrant   **Always confirm dose/schedule in your pharmacy ordering system**  Patient Characteristics: Distant Metastases or Locoregional Recurrent Disease - Unresected, M0 or Locally Advanced Unresectable Disease Progressing after Neoadjuvant and Local Therapies, M0, HER2 Low/Negative, ER Positive, Endocrine Therapy, Postmenopausal, First Line,  Progression on Current Adjuvant  Endocrine Therapy, Candidate for a CDK4/6 Inhibitor, Abemaciclib Preferred Therapeutic Status: Distant Metastases HER2 Status: Negative (-) ER Status: Positive (+) PR Status: Positive (+) Therapy Approach Indicated: Standard Chemotherapy/Endocrine Therapy Menopausal Status: Postmenopausal Line of Therapy: First Line Previous Therapy: Progression on Current Adjuvant Endocrine Therapy Intent of Therapy: Non-Curative / Palliative Intent, Discussed with Patient

## 2023-06-03 ENCOUNTER — Other Ambulatory Visit: Payer: Self-pay | Admitting: Oncology

## 2023-06-03 ENCOUNTER — Other Ambulatory Visit: Payer: Self-pay

## 2023-06-03 ENCOUNTER — Ambulatory Visit: Payer: Medicare PPO | Admitting: Oncology

## 2023-06-03 ENCOUNTER — Other Ambulatory Visit (HOSPITAL_COMMUNITY): Payer: Self-pay

## 2023-06-03 ENCOUNTER — Inpatient Hospital Stay: Payer: Medicare PPO

## 2023-06-03 DIAGNOSIS — Z170 Estrogen receptor positive status [ER+]: Secondary | ICD-10-CM | POA: Diagnosis not present

## 2023-06-03 DIAGNOSIS — Z85828 Personal history of other malignant neoplasm of skin: Secondary | ICD-10-CM | POA: Diagnosis not present

## 2023-06-03 DIAGNOSIS — C7951 Secondary malignant neoplasm of bone: Secondary | ICD-10-CM | POA: Diagnosis not present

## 2023-06-03 DIAGNOSIS — Z79811 Long term (current) use of aromatase inhibitors: Secondary | ICD-10-CM | POA: Diagnosis not present

## 2023-06-03 DIAGNOSIS — C50411 Malignant neoplasm of upper-outer quadrant of right female breast: Secondary | ICD-10-CM | POA: Diagnosis not present

## 2023-06-03 DIAGNOSIS — Z9013 Acquired absence of bilateral breasts and nipples: Secondary | ICD-10-CM | POA: Diagnosis not present

## 2023-06-03 DIAGNOSIS — Z79818 Long term (current) use of other agents affecting estrogen receptors and estrogen levels: Secondary | ICD-10-CM | POA: Diagnosis not present

## 2023-06-03 DIAGNOSIS — Z510 Encounter for antineoplastic radiation therapy: Secondary | ICD-10-CM | POA: Diagnosis not present

## 2023-06-03 DIAGNOSIS — Z8614 Personal history of Methicillin resistant Staphylococcus aureus infection: Secondary | ICD-10-CM | POA: Diagnosis not present

## 2023-06-03 MED ORDER — FULVESTRANT 250 MG/5ML IM SOSY
500.0000 mg | PREFILLED_SYRINGE | Freq: Once | INTRAMUSCULAR | Status: AC
  Administered 2023-06-03: 500 mg via INTRAMUSCULAR
  Filled 2023-06-03: qty 10

## 2023-06-05 ENCOUNTER — Telehealth: Payer: Self-pay | Admitting: Pharmacist

## 2023-06-05 ENCOUNTER — Other Ambulatory Visit: Payer: Self-pay | Admitting: Pharmacist

## 2023-06-05 DIAGNOSIS — C50411 Malignant neoplasm of upper-outer quadrant of right female breast: Secondary | ICD-10-CM

## 2023-06-05 MED ORDER — ONDANSETRON HCL 8 MG PO TABS
8.0000 mg | ORAL_TABLET | Freq: Three times a day (TID) | ORAL | 0 refills | Status: AC | PRN
Start: 2023-06-05 — End: ?

## 2023-06-05 NOTE — Telephone Encounter (Signed)
Patient called to report she was having nausea and vomiting since she received her her fulvestrant injection on 7/16. She is also reporting a low grade fever or 99.6. She has no other cold/infection s/sx to report  Patient asking what she can take for nausea.   Recently rxs for ondansetron and prochlorperazine were sent the pharmacy for her. She has ordered them from her [facility] but states they will not be delivered until tomorrow 06/06/23. I sent a short fill rx for ondansetron to her local [facility] and she plans to fill this using goodrx.   She reports staying hydrated. She knows to call the office if anything changes.  Patient want to hold on starting her abemaciclib until her nausea/vomiting resolves.

## 2023-06-09 ENCOUNTER — Ambulatory Visit: Payer: Medicare PPO

## 2023-06-11 ENCOUNTER — Ambulatory Visit
Admission: RE | Admit: 2023-06-11 | Discharge: 2023-06-11 | Disposition: A | Discharge status: Home / Self Care | Payer: Medicare PPO | Source: Ambulatory Visit | Attending: Radiation Oncology | Admitting: Radiation Oncology

## 2023-06-11 ENCOUNTER — Other Ambulatory Visit: Payer: Self-pay | Admitting: Interventional Radiology

## 2023-06-11 DIAGNOSIS — Z9013 Acquired absence of bilateral breasts and nipples: Secondary | ICD-10-CM | POA: Diagnosis not present

## 2023-06-11 DIAGNOSIS — C50911 Malignant neoplasm of unspecified site of right female breast: Secondary | ICD-10-CM | POA: Diagnosis not present

## 2023-06-11 DIAGNOSIS — C7951 Secondary malignant neoplasm of bone: Secondary | ICD-10-CM

## 2023-06-11 DIAGNOSIS — Z85828 Personal history of other malignant neoplasm of skin: Secondary | ICD-10-CM | POA: Diagnosis not present

## 2023-06-11 DIAGNOSIS — Z510 Encounter for antineoplastic radiation therapy: Secondary | ICD-10-CM | POA: Insufficient documentation

## 2023-06-11 DIAGNOSIS — C50411 Malignant neoplasm of upper-outer quadrant of right female breast: Secondary | ICD-10-CM | POA: Insufficient documentation

## 2023-06-11 DIAGNOSIS — Z79818 Long term (current) use of other agents affecting estrogen receptors and estrogen levels: Secondary | ICD-10-CM | POA: Diagnosis not present

## 2023-06-11 DIAGNOSIS — Z8614 Personal history of Methicillin resistant Staphylococcus aureus infection: Secondary | ICD-10-CM | POA: Diagnosis not present

## 2023-06-11 DIAGNOSIS — Z79811 Long term (current) use of aromatase inhibitors: Secondary | ICD-10-CM | POA: Diagnosis not present

## 2023-06-11 DIAGNOSIS — Z170 Estrogen receptor positive status [ER+]: Secondary | ICD-10-CM | POA: Diagnosis not present

## 2023-06-16 ENCOUNTER — Ambulatory Visit: Payer: Medicare PPO

## 2023-06-16 ENCOUNTER — Telehealth: Payer: Self-pay | Admitting: Pharmacist

## 2023-06-16 ENCOUNTER — Other Ambulatory Visit: Payer: Self-pay | Admitting: Pharmacist

## 2023-06-16 DIAGNOSIS — C50411 Malignant neoplasm of upper-outer quadrant of right female breast: Secondary | ICD-10-CM

## 2023-06-16 DIAGNOSIS — C50911 Malignant neoplasm of unspecified site of right female breast: Secondary | ICD-10-CM | POA: Diagnosis not present

## 2023-06-16 DIAGNOSIS — Z79811 Long term (current) use of aromatase inhibitors: Secondary | ICD-10-CM | POA: Diagnosis not present

## 2023-06-16 DIAGNOSIS — Z85828 Personal history of other malignant neoplasm of skin: Secondary | ICD-10-CM | POA: Diagnosis not present

## 2023-06-16 DIAGNOSIS — Z79818 Long term (current) use of other agents affecting estrogen receptors and estrogen levels: Secondary | ICD-10-CM | POA: Diagnosis not present

## 2023-06-16 DIAGNOSIS — C7951 Secondary malignant neoplasm of bone: Secondary | ICD-10-CM | POA: Diagnosis not present

## 2023-06-16 DIAGNOSIS — Z9013 Acquired absence of bilateral breasts and nipples: Secondary | ICD-10-CM | POA: Diagnosis not present

## 2023-06-16 DIAGNOSIS — Z8614 Personal history of Methicillin resistant Staphylococcus aureus infection: Secondary | ICD-10-CM | POA: Diagnosis not present

## 2023-06-16 DIAGNOSIS — Z170 Estrogen receptor positive status [ER+]: Secondary | ICD-10-CM | POA: Diagnosis not present

## 2023-06-16 DIAGNOSIS — Z510 Encounter for antineoplastic radiation therapy: Secondary | ICD-10-CM | POA: Diagnosis not present

## 2023-06-16 MED ORDER — DIPHENOXYLATE-ATROPINE 2.5-0.025 MG PO TABS
2.0000 | ORAL_TABLET | Freq: Four times a day (QID) | ORAL | 1 refills | Status: AC | PRN
Start: 2023-06-16 — End: ?

## 2023-06-17 ENCOUNTER — Inpatient Hospital Stay: Payer: Medicare PPO

## 2023-06-17 DIAGNOSIS — Z9013 Acquired absence of bilateral breasts and nipples: Secondary | ICD-10-CM | POA: Diagnosis not present

## 2023-06-17 DIAGNOSIS — Z170 Estrogen receptor positive status [ER+]: Secondary | ICD-10-CM | POA: Diagnosis not present

## 2023-06-17 DIAGNOSIS — C50411 Malignant neoplasm of upper-outer quadrant of right female breast: Secondary | ICD-10-CM

## 2023-06-17 DIAGNOSIS — Z79811 Long term (current) use of aromatase inhibitors: Secondary | ICD-10-CM | POA: Diagnosis not present

## 2023-06-17 DIAGNOSIS — Z8614 Personal history of Methicillin resistant Staphylococcus aureus infection: Secondary | ICD-10-CM | POA: Diagnosis not present

## 2023-06-17 DIAGNOSIS — Z85828 Personal history of other malignant neoplasm of skin: Secondary | ICD-10-CM | POA: Diagnosis not present

## 2023-06-17 DIAGNOSIS — C7951 Secondary malignant neoplasm of bone: Secondary | ICD-10-CM | POA: Diagnosis not present

## 2023-06-17 DIAGNOSIS — Z79818 Long term (current) use of other agents affecting estrogen receptors and estrogen levels: Secondary | ICD-10-CM | POA: Diagnosis not present

## 2023-06-17 DIAGNOSIS — Z510 Encounter for antineoplastic radiation therapy: Secondary | ICD-10-CM | POA: Diagnosis not present

## 2023-06-17 MED ORDER — FULVESTRANT 250 MG/5ML IM SOSY
500.0000 mg | PREFILLED_SYRINGE | Freq: Once | INTRAMUSCULAR | Status: AC
  Administered 2023-06-17: 500 mg via INTRAMUSCULAR
  Filled 2023-06-17: qty 10

## 2023-06-18 ENCOUNTER — Ambulatory Visit
Admission: RE | Admit: 2023-06-18 | Discharge: 2023-06-18 | Disposition: A | Discharge status: Home / Self Care | Payer: Medicare PPO | Source: Ambulatory Visit | Attending: Radiation Oncology | Admitting: Radiation Oncology

## 2023-06-18 ENCOUNTER — Inpatient Hospital Stay: Payer: Medicare PPO

## 2023-06-18 ENCOUNTER — Other Ambulatory Visit: Payer: Self-pay | Admitting: *Deleted

## 2023-06-18 ENCOUNTER — Other Ambulatory Visit: Payer: Self-pay

## 2023-06-18 DIAGNOSIS — Z79818 Long term (current) use of other agents affecting estrogen receptors and estrogen levels: Secondary | ICD-10-CM | POA: Diagnosis not present

## 2023-06-18 DIAGNOSIS — Z8614 Personal history of Methicillin resistant Staphylococcus aureus infection: Secondary | ICD-10-CM | POA: Diagnosis not present

## 2023-06-18 DIAGNOSIS — Z510 Encounter for antineoplastic radiation therapy: Secondary | ICD-10-CM | POA: Diagnosis not present

## 2023-06-18 DIAGNOSIS — C50911 Malignant neoplasm of unspecified site of right female breast: Secondary | ICD-10-CM | POA: Diagnosis not present

## 2023-06-18 DIAGNOSIS — Z170 Estrogen receptor positive status [ER+]: Secondary | ICD-10-CM | POA: Diagnosis not present

## 2023-06-18 DIAGNOSIS — Z85828 Personal history of other malignant neoplasm of skin: Secondary | ICD-10-CM | POA: Diagnosis not present

## 2023-06-18 DIAGNOSIS — C7951 Secondary malignant neoplasm of bone: Secondary | ICD-10-CM | POA: Diagnosis not present

## 2023-06-18 DIAGNOSIS — Z79811 Long term (current) use of aromatase inhibitors: Secondary | ICD-10-CM | POA: Diagnosis not present

## 2023-06-18 DIAGNOSIS — R7989 Other specified abnormal findings of blood chemistry: Secondary | ICD-10-CM

## 2023-06-18 DIAGNOSIS — Z9013 Acquired absence of bilateral breasts and nipples: Secondary | ICD-10-CM | POA: Diagnosis not present

## 2023-06-18 DIAGNOSIS — C50411 Malignant neoplasm of upper-outer quadrant of right female breast: Secondary | ICD-10-CM | POA: Diagnosis not present

## 2023-06-18 LAB — COMPREHENSIVE METABOLIC PANEL
ALT: 22 U/L (ref 0–44)
AST: 21 U/L (ref 15–41)
Albumin: 3.9 g/dL (ref 3.5–5.0)
Alkaline Phosphatase: 65 U/L (ref 38–126)
Anion gap: 8 (ref 5–15)
BUN: 8 mg/dL (ref 8–23)
CO2: 25 mmol/L (ref 22–32)
Calcium: 8.8 mg/dL — ABNORMAL LOW (ref 8.9–10.3)
Chloride: 99 mmol/L (ref 98–111)
Creatinine, Ser: 1.41 mg/dL — ABNORMAL HIGH (ref 0.44–1.00)
GFR, Estimated: 41 mL/min — ABNORMAL LOW (ref >60)
Glucose, Bld: 109 mg/dL — ABNORMAL HIGH (ref 70–99)
Potassium: 3.4 mmol/L — ABNORMAL LOW (ref 3.5–5.1)
Sodium: 132 mmol/L — ABNORMAL LOW (ref 135–145)
Total Bilirubin: 1 mg/dL (ref 0.3–1.2)
Total Protein: 7.2 g/dL (ref 6.5–8.1)

## 2023-06-18 LAB — RAD ONC ARIA SESSION SUMMARY
Course Elapsed Days: 0
Plan Fractions Treated to Date: 1
Plan Prescribed Dose Per Fraction: 6 Gy
Plan Total Fractions Prescribed: 5
Plan Total Prescribed Dose: 30 Gy
Reference Point Dosage Given to Date: 6 Gy
Reference Point Session Dosage Given: 6 Gy
Session Number: 1

## 2023-06-18 LAB — CBC WITH DIFFERENTIAL/PLATELET
Abs Immature Granulocytes: 0.02 10*3/uL (ref 0.00–0.07)
Basophils Absolute: 0 10*3/uL (ref 0.0–0.1)
Basophils Relative: 1 %
Eosinophils Absolute: 0.1 10*3/uL (ref 0.0–0.5)
Eosinophils Relative: 2 %
HCT: 39.5 % (ref 36.0–46.0)
Hemoglobin: 13.3 g/dL (ref 12.0–15.0)
Immature Granulocytes: 1 %
Lymphocytes Relative: 26 %
Lymphs Abs: 1 10*3/uL (ref 0.7–4.0)
MCH: 30 pg (ref 26.0–34.0)
MCHC: 33.7 g/dL (ref 30.0–36.0)
MCV: 89 fL (ref 80.0–100.0)
Monocytes Absolute: 0.2 10*3/uL (ref 0.1–1.0)
Monocytes Relative: 5 %
Neutro Abs: 2.4 10*3/uL (ref 1.7–7.7)
Neutrophils Relative %: 65 %
Platelets: 229 10*3/uL (ref 150–400)
RBC: 4.44 MIL/uL (ref 3.87–5.11)
RDW: 12 % (ref 11.5–15.5)
WBC: 3.7 10*3/uL — ABNORMAL LOW (ref 4.0–10.5)
nRBC: 0 % (ref 0.0–0.2)

## 2023-06-18 NOTE — Telephone Encounter (Signed)
Megan Vega returned my call, she took 2 tablets of lomotil last night and 2 tablets of loperamide this morning, she has not had any diarrhea so far today.    She does not fill dehydrated and reports actively drinking more water since the diarrhea started. I offered her the option to come into fluid clinic and she thought she was okay without that for now. I told her to call the office if things changed.  She knows that is her diarrhea continues we would consider dose reducing her abemaciclib.   Patient's potassium is only slightly out of the range of normal. Discussed with patient increasing her dietary potassium.   Pt to RTC in 2 weeks for repeat labs

## 2023-06-18 NOTE — Telephone Encounter (Signed)
Patient called on 06/16/23 to report diarrhea. Discussed with patient ways to optimize diarrhea management with her loperamide. Discussed patient with Dr. Smith Robert. Prescription for lomotil was sent to patient's local pharmacy, patient now she can step up to lomotil if the feel like the loperamide it not helping enough with management.  Will follow up with patient on 06/19/23

## 2023-06-18 NOTE — Telephone Encounter (Signed)
Slight bump in patient's SCr on labs today, 06/18/23. Could be increased from patient starting abemaciclib or a response to dehydration due to diarrhea.   Called and LVM for patient to return my call. Will discuss hydration and offer fluid clinic visit if needed.

## 2023-06-19 ENCOUNTER — Telehealth: Payer: Self-pay | Admitting: *Deleted

## 2023-06-19 NOTE — Telephone Encounter (Signed)
Called the pt. To let her know that we added a lab appt on 8/13. Dr. Smith Robert wanted her to have a lab to make sure the kidneys are good, I asked her if she is till having diarrhea and she states that she has not had ot for 2 days. She did say that for the last 2 days that she takes her verzenio and not long after it she vomits clear water. I told her that she should take nausea pill zofran about 45 min to 1 hour before taking the verzenio and see if that helps and if not to call us, she is ok with that. Her labs appt 8/13 12:45 and she already had an appt at 1 pm for the faslodex. She is good with this plan

## 2023-06-20 ENCOUNTER — Ambulatory Visit
Admission: RE | Admit: 2023-06-20 | Discharge: 2023-06-20 | Disposition: A | Discharge status: Home / Self Care | Payer: Medicare PPO | Source: Ambulatory Visit | Attending: Radiation Oncology | Admitting: Radiation Oncology

## 2023-06-20 ENCOUNTER — Other Ambulatory Visit: Payer: Self-pay

## 2023-06-20 DIAGNOSIS — C50411 Malignant neoplasm of upper-outer quadrant of right female breast: Secondary | ICD-10-CM | POA: Diagnosis not present

## 2023-06-20 DIAGNOSIS — Z79818 Long term (current) use of other agents affecting estrogen receptors and estrogen levels: Secondary | ICD-10-CM | POA: Insufficient documentation

## 2023-06-20 DIAGNOSIS — Z9013 Acquired absence of bilateral breasts and nipples: Secondary | ICD-10-CM | POA: Insufficient documentation

## 2023-06-20 DIAGNOSIS — C7951 Secondary malignant neoplasm of bone: Secondary | ICD-10-CM | POA: Diagnosis not present

## 2023-06-20 DIAGNOSIS — Z85828 Personal history of other malignant neoplasm of skin: Secondary | ICD-10-CM | POA: Insufficient documentation

## 2023-06-20 DIAGNOSIS — Z8614 Personal history of Methicillin resistant Staphylococcus aureus infection: Secondary | ICD-10-CM | POA: Insufficient documentation

## 2023-06-20 DIAGNOSIS — Z170 Estrogen receptor positive status [ER+]: Secondary | ICD-10-CM | POA: Diagnosis not present

## 2023-06-20 DIAGNOSIS — Z87891 Personal history of nicotine dependence: Secondary | ICD-10-CM | POA: Insufficient documentation

## 2023-06-20 DIAGNOSIS — Z79899 Other long term (current) drug therapy: Secondary | ICD-10-CM | POA: Insufficient documentation

## 2023-06-20 DIAGNOSIS — Z801 Family history of malignant neoplasm of trachea, bronchus and lung: Secondary | ICD-10-CM | POA: Diagnosis not present

## 2023-06-20 DIAGNOSIS — Z9221 Personal history of antineoplastic chemotherapy: Secondary | ICD-10-CM | POA: Diagnosis not present

## 2023-06-20 DIAGNOSIS — Z800 Family history of malignant neoplasm of digestive organs: Secondary | ICD-10-CM | POA: Diagnosis not present

## 2023-06-20 DIAGNOSIS — Z510 Encounter for antineoplastic radiation therapy: Secondary | ICD-10-CM | POA: Diagnosis not present

## 2023-06-20 DIAGNOSIS — Z79811 Long term (current) use of aromatase inhibitors: Secondary | ICD-10-CM | POA: Insufficient documentation

## 2023-06-20 DIAGNOSIS — Z923 Personal history of irradiation: Secondary | ICD-10-CM | POA: Insufficient documentation

## 2023-06-20 LAB — RAD ONC ARIA SESSION SUMMARY
Course Elapsed Days: 2
Plan Fractions Treated to Date: 2
Plan Prescribed Dose Per Fraction: 6 Gy
Plan Total Fractions Prescribed: 5
Plan Total Prescribed Dose: 30 Gy
Reference Point Dosage Given to Date: 12 Gy
Reference Point Session Dosage Given: 6 Gy
Session Number: 2

## 2023-06-23 ENCOUNTER — Other Ambulatory Visit (HOSPITAL_COMMUNITY): Payer: Self-pay

## 2023-06-23 ENCOUNTER — Other Ambulatory Visit: Payer: Self-pay | Admitting: Oncology

## 2023-06-23 ENCOUNTER — Ambulatory Visit
Admission: RE | Admit: 2023-06-23 | Discharge: 2023-06-23 | Disposition: A | Discharge status: Home / Self Care | Payer: Medicare PPO | Source: Ambulatory Visit | Attending: Radiation Oncology | Admitting: Radiation Oncology

## 2023-06-23 ENCOUNTER — Other Ambulatory Visit: Payer: Self-pay

## 2023-06-23 DIAGNOSIS — Z79818 Long term (current) use of other agents affecting estrogen receptors and estrogen levels: Secondary | ICD-10-CM | POA: Diagnosis not present

## 2023-06-23 DIAGNOSIS — Z8614 Personal history of Methicillin resistant Staphylococcus aureus infection: Secondary | ICD-10-CM | POA: Diagnosis not present

## 2023-06-23 DIAGNOSIS — Z79811 Long term (current) use of aromatase inhibitors: Secondary | ICD-10-CM | POA: Diagnosis not present

## 2023-06-23 DIAGNOSIS — Z170 Estrogen receptor positive status [ER+]: Secondary | ICD-10-CM | POA: Diagnosis not present

## 2023-06-23 DIAGNOSIS — C50411 Malignant neoplasm of upper-outer quadrant of right female breast: Secondary | ICD-10-CM | POA: Diagnosis not present

## 2023-06-23 DIAGNOSIS — Z9013 Acquired absence of bilateral breasts and nipples: Secondary | ICD-10-CM | POA: Diagnosis not present

## 2023-06-23 DIAGNOSIS — Z85828 Personal history of other malignant neoplasm of skin: Secondary | ICD-10-CM | POA: Diagnosis not present

## 2023-06-23 DIAGNOSIS — C7951 Secondary malignant neoplasm of bone: Secondary | ICD-10-CM | POA: Diagnosis not present

## 2023-06-23 DIAGNOSIS — Z510 Encounter for antineoplastic radiation therapy: Secondary | ICD-10-CM | POA: Diagnosis not present

## 2023-06-23 LAB — RAD ONC ARIA SESSION SUMMARY
Course Elapsed Days: 5
Plan Fractions Treated to Date: 3
Plan Prescribed Dose Per Fraction: 6 Gy
Plan Total Fractions Prescribed: 5
Plan Total Prescribed Dose: 30 Gy
Reference Point Dosage Given to Date: 18 Gy
Reference Point Session Dosage Given: 6 Gy
Session Number: 3

## 2023-06-24 ENCOUNTER — Other Ambulatory Visit: Payer: Self-pay | Admitting: Oncology

## 2023-06-24 ENCOUNTER — Other Ambulatory Visit (HOSPITAL_COMMUNITY): Payer: Self-pay

## 2023-06-24 DIAGNOSIS — C50411 Malignant neoplasm of upper-outer quadrant of right female breast: Secondary | ICD-10-CM

## 2023-06-24 MED ORDER — ABEMACICLIB 150 MG PO TABS
150.0000 mg | ORAL_TABLET | Freq: Two times a day (BID) | ORAL | 0 refills | Status: DC
Start: 2023-06-24 — End: 2023-07-16
  Filled 2023-06-24: qty 56, 28d supply, fill #0

## 2023-06-25 ENCOUNTER — Other Ambulatory Visit: Payer: Self-pay

## 2023-06-25 ENCOUNTER — Ambulatory Visit
Admission: RE | Admit: 2023-06-25 | Discharge: 2023-06-25 | Disposition: A | Discharge status: Home / Self Care | Payer: Medicare PPO | Source: Ambulatory Visit | Attending: Radiation Oncology | Admitting: Radiation Oncology

## 2023-06-25 DIAGNOSIS — Z9013 Acquired absence of bilateral breasts and nipples: Secondary | ICD-10-CM | POA: Diagnosis not present

## 2023-06-25 DIAGNOSIS — Z510 Encounter for antineoplastic radiation therapy: Secondary | ICD-10-CM | POA: Diagnosis not present

## 2023-06-25 DIAGNOSIS — Z79811 Long term (current) use of aromatase inhibitors: Secondary | ICD-10-CM | POA: Diagnosis not present

## 2023-06-25 DIAGNOSIS — C50411 Malignant neoplasm of upper-outer quadrant of right female breast: Secondary | ICD-10-CM | POA: Diagnosis not present

## 2023-06-25 DIAGNOSIS — Z170 Estrogen receptor positive status [ER+]: Secondary | ICD-10-CM | POA: Diagnosis not present

## 2023-06-25 DIAGNOSIS — Z85828 Personal history of other malignant neoplasm of skin: Secondary | ICD-10-CM | POA: Diagnosis not present

## 2023-06-25 DIAGNOSIS — Z79818 Long term (current) use of other agents affecting estrogen receptors and estrogen levels: Secondary | ICD-10-CM | POA: Diagnosis not present

## 2023-06-25 DIAGNOSIS — Z8614 Personal history of Methicillin resistant Staphylococcus aureus infection: Secondary | ICD-10-CM | POA: Diagnosis not present

## 2023-06-25 DIAGNOSIS — C7951 Secondary malignant neoplasm of bone: Secondary | ICD-10-CM | POA: Diagnosis not present

## 2023-06-25 LAB — RAD ONC ARIA SESSION SUMMARY
Course Elapsed Days: 7
Plan Fractions Treated to Date: 4
Plan Prescribed Dose Per Fraction: 6 Gy
Plan Total Fractions Prescribed: 5
Plan Total Prescribed Dose: 30 Gy
Reference Point Dosage Given to Date: 24 Gy
Reference Point Session Dosage Given: 6 Gy
Session Number: 4

## 2023-06-27 ENCOUNTER — Ambulatory Visit
Admission: RE | Admit: 2023-06-27 | Discharge: 2023-06-27 | Disposition: A | Discharge status: Home / Self Care | Payer: Medicare PPO | Source: Ambulatory Visit | Attending: Interventional Radiology | Admitting: Interventional Radiology

## 2023-06-27 ENCOUNTER — Other Ambulatory Visit (HOSPITAL_COMMUNITY): Payer: Self-pay

## 2023-06-27 DIAGNOSIS — C7951 Secondary malignant neoplasm of bone: Secondary | ICD-10-CM

## 2023-06-27 DIAGNOSIS — C7952 Secondary malignant neoplasm of bone marrow: Secondary | ICD-10-CM | POA: Diagnosis not present

## 2023-06-27 HISTORY — PX: IR RADIOLOGIST EVAL & MGMT: IMG5224

## 2023-06-27 NOTE — Progress Notes (Signed)
Primary Care; Jason Coop Lyn Records, FNP Medical Oncology; Creig Hines, MD Rad Onc: Carmina Miller, MD   Virtual Visit via Telephone Note   I connected with [name] on 06/27/23 by telephone and verified that I am speaking with the correct person using two identifiers.  I discussed the limitations, risks, security and privacy concerns of performing an evaluation and management service by telephone and the availability of in-person appointments.   History of present illness:  Megan Vega is a 67 y.o. female w PMHx significant for R breast IDC and L breast DCIS Dx in 2019, s/p bilateral mastectomies. Initial staging was negative for extranodal metastatic disease. Pt was treated with neoadjuvant chemoRx, followed by adjuvant XRT and AI (letrozole) in 12/2018.    Pt began complaining of lower back pain in Q2-01/2022, with initial imaging with L-spine XR demonstrating degenerative change. Her pain persisted and she had an MRI L spine followed by PET CT in 11/2022, which were revealing for an enhancing and hypermetabolic lesion at L5 vertebral body, suspicious for osseous metastasis. A confirmation bone biopsy in further evaluation was performed on 12/26/22, with the result negative for malignant cells.   Pt was referred for repeat bone Bx and RFA w concern for solitary metastatic osseous spinal lesion. She underwent L5 percutaneous RFA "OsteoCool" ablation and BKP with me on 05/13/23. She returns to clinic for routine post procedure followup.  She denies any access site discomfort or complication. She endorses residual, persistent and focused LBP though improved since the procedure which does not affect her QoL. She has since began XRT with Dr Rushie Chestnut.   Review of Systems: A 12-point ROS discussed, and pertinent positives are indicated in the HPI above.  All other systems are negative.   Past Medical History:  Diagnosis Date   Ankle fracture    Breast cancer (HCC)    bilateral    Cancer (HCC)    Basal Cell Carcinoma, about 2011 chest   Cataract    Family history of adverse reaction to anesthesia    daughter with PONV   Hemorrhoids    History of methicillin resistant staphylococcus aureus (MRSA) 2015   Migraines    MIGRAINES occ   Personal history of chemotherapy    Personal history of radiation therapy    Wears glasses     Past Surgical History:  Procedure Laterality Date   BREAST BIOPSY Bilateral 10/29/2017   L breast DCIS, R breast invasive mammary carcinoma of no special type, ER/PR+, Her2/neu 1+   BREAST RECONSTRUCTION Right 02/07/2020   Procedure: BREAST RECONSTRUCTION;  Surgeon: Peggye Form, DO;  Location: [facility];  Service: Plastics;  Laterality: Right;   BREAST RECONSTRUCTION WITH PLACEMENT OF TISSUE EXPANDER AND FLEX HD (ACELLULAR HYDRATED DERMIS) Bilateral 08/30/2019   Procedure: BREAST RECONSTRUCTION WITH PLACEMENT OF TISSUE EXPANDER AND FLEX HD (ACELLULAR HYDRATED DERMIS);  Surgeon: Peggye Form, DO;  Location: [facility] ORS;  Service: Plastics;  Laterality: Bilateral;  2.5 hours, please   CESAREAN SECTION     COLONOSCOPY  2012   COLONOSCOPY WITH PROPOFOL N/A 12/01/2018   Procedure: COLONOSCOPY WITH PROPOFOL;  Surgeon: Midge Minium, MD;  Location[facility];  Service: Endoscopy;  Laterality: N/A;   DEBRIDEMENT AND CLOSURE WOUND Bilateral 10/02/2020   Procedure: Excision of bilateral breast wound;  Surgeon: Peggye Form, DO;  Location: [facility];  Service: Plastics;  Laterality: Bilateral;  1.5 hours, please   FRACTURE SURGERY     ankle   IR  CT BONE TROCAR/NEEDLE BIOPSY DEEP  12/26/2022   IR KYPHO LUMBAR INC FX REDUCE BONE BX UNI/BIL CANNULATION INC/IMAGING  05/14/2023   IR RADIOLOGIST EVAL & MGMT  04/28/2023   IR RADIOLOGIST EVAL & MGMT  06/27/2023   IRRIGATION AND DEBRIDEMENT ABSCESS Right 03/12/2018   Procedure: IRRIGATION AND DEBRIDEMENT RIGHT AXILLARY ABSCESS;  Surgeon: Earline Mayotte, MD;  Location: [facility]  ORS;  Service: General;  Laterality: Right;   LATISSIMUS FLAP TO BREAST Right 02/07/2020   Procedure: LATISSIMUS MYOCUTANEOUS FLAP TO BREAST;  Surgeon: Peggye Form, DO;  Location: [facility] OR;  Service: Plastics;  Laterality: Right;  3 hours   MANDIBLE FRACTURE SURGERY     MASTECTOMY     MASTECTOMY MODIFIED RADICAL Right 07/10/2018   ypT2 ypN2a ER/PR positive, HER-2/neu negative  Surgeon: Earline Mayotte, MD;  Location: [facility] ORS;  Service: General;  Laterality: Right;   PORT-A-CATH REMOVAL Left 02/07/2020   Procedure: REMOVAL PORT-A-CATH;  Surgeon: Peggye Form, DO;  Location: [facility];  Service: Plastics;  Laterality: Left;   PORTACATH PLACEMENT Left 11/28/2017   Procedure: INSERTION PORT-A-CATH;  Surgeon: Earline Mayotte, MD;  Location: [facility];  Service: General;  Laterality: Left;   REMOVAL OF TISSUE EXPANDER Right 06/05/2020   Procedure: TISSUE EXPANDER REMOVAL;  Surgeon: Peggye Form, DO;  Location: [facility] OR[facility];  Service: Plastics;  Laterality: Right;   SIMPLE MASTECTOMY WITH AXILLARY SENTINEL NODE BIOPSY Left 07/10/2018   High-grade DCIS SIMPLE MASTECTOMY;  Surgeon: Earline Mayotte, MD;  Location: [facility] ORS;  Service: General;  Laterality: Left;   TISSUE EXPANDER PLACEMENT Left 06/05/2020   Procedure: TISSUE EXPANDER FLUID REMOVAL-100ML;  Surgeon: Peggye Form, DO;  Location: [facility];  Service: Plastics;  Laterality: Left;   TISSUE EXPANDER PLACEMENT Left 06/14/2020   Procedure: TISSUE EXPANDER REMOVAL;  Surgeon: Peggye Form, DO;  Location: [facility];  Service: Plastics;  Laterality: Left;  45 min, please    Allergies: Peanut-containing drug products  Medications: Prior to Admission medications   Medication Sig Start Date End Date Taking? Authorizing Provider  abemaciclib (VERZENIO) 150 MG tablet Take 1 tablet (150 mg total) by mouth 2 (two) times daily. 06/24/23   Creig Hines, MD  b complex vitamins tablet Take 1 tablet by mouth daily.    [provider]  Calcium-Magnesium-Vitamin D (CALCIUM 1200+D3 PO) Take 1,200 mg by mouth daily.     [provider]  diphenoxylate-atropine (LOMOTIL) 2.5-0.025 MG tablet Take 2 tablets by mouth 4 (four) times daily as needed for diarrhea or loose stools. 06/16/23   Creig Hines, MD  loperamide (IMODIUM) 2 MG capsule Take 2 tabs by mouth with first loose stool, then 1 tab with each additional loose stool as needed. Do not exceed 8 tabs in a 24-hour period 06/02/23   Creig Hines, MD  loratadine (CLARITIN) 10 MG tablet Take 10 mg by mouth every morning.     [provider]  Melatonin 5 MG CAPS Take 5 mg by mouth daily as needed (sleep).     [provider]  Multiple Vitamin (MULTIVITAMIN WITH MINERALS) TABS tablet Take 1 tablet by mouth daily. Centrum Silver    [provider]  Naphazoline HCl (CLEAR EYES OP) Place 1 drop into both eyes daily.    [provider]  ondansetron (ZOFRAN) 8 MG tablet Take 1 tablet (8 mg total) by mouth every 8 (eight) hours as needed for nausea or vomiting. 06/05/23   Remi Haggard,  RPH-CPP  Probiotic Product (PROBIOTIC PO) Take 1 capsule by mouth daily.    [provider]  prochlorperazine (COMPAZINE) 10 MG tablet Take 1 tablet (10 mg total) by mouth every 6 (six) hours as needed for nausea or vomiting. 06/02/23   Creig Hines, MD  traMADol (ULTRAM) 50 MG tablet Take 1 tablet (50 mg total) by mouth every 6 (six) hours as needed for moderate pain. 05/14/23   Shon Hough, NP     Family History  Problem Relation Age of Onset   Cancer Mother        Esophageal Cancer   Early death Mother        Age 23   Cancer Father        Lung Cancer   Diabetes Father    Diabetes Brother        Controlled with diet   Heart attack Paternal Grandfather    Colon cancer Neg Hx     Social History   Socioeconomic History   Marital status: Divorced    Spouse name: Not on file   Number of children: 2   Years of education:  Not on file   Highest education level: Not on file  Occupational History   Occupation: Retired  Tobacco Use   Smoking status: Former    Current packs/day: 0.00    Average packs/day: 0.3 packs/day for 10.0 years (2.5 ttl pk-yrs)    Types: Cigarettes    Start date: 1968    Quit date: 1978    Years since quitting: 46.6   Smokeless tobacco: Never  Vaping Use   Vaping status: Never Used  Substance and Sexual Activity   Alcohol use: Yes    Alcohol/week: 1.0 standard drink of alcohol    Types: 1 Glasses of wine per week    Comment: " occasional glass of wine "   Drug use: No   Sexual activity: Not Currently    Birth control/protection: Post-menopausal  Other Topics Concern   Not on file  Social History Narrative   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    Lives at home (son 78 yo lives with her)   Children 2   Pets: 3 small dogs and 1 frog    Caffeine- 1 cup of coffee in the morning, rare tea   Social Determinants of Health   Financial Resource Strain: Low Risk  (04/02/2023)   Overall Financial Resource Strain (CARDIA)    Difficulty of Paying Living Expenses: Not hard at all  Food Insecurity: No Food Insecurity (04/02/2023)   Hunger Vital Sign    Worried About Running Out of Food in the Last Year: Never true    Ran Out of Food in the Last Year: Never true  Transportation Needs: No Transportation Needs (04/02/2023)   PRAPARE - Administrator, Civil Service (Medical): No    Lack of Transportation (Non-Medical): No  Physical Activity: Sufficiently Active (04/02/2023)   Exercise Vital Sign    Days of Exercise per Week: 7 days    Minutes of Exercise per Session: 30 min  Stress: No Stress Concern Present (04/02/2023)   [employer] of Occupational Health - Occupational Stress Questionnaire    Feeling of Stress : Not at all  Social Connections: Moderately Isolated (04/02/2023)   Social Connection and Isolation Panel [NHANES]    Frequency of Communication with Friends  and Family: More than three times a week    Frequency of Social Gatherings with Friends and Family:  Once a week    Attends Religious Services: More than 4 times per year    Active Member of Clubs or Organizations: No    Attends Banker Meetings: Never    Marital Status: Divorced     Vital Signs: LMP  (LMP Unknown)   Physical Exam Deferred secondary to virtual visit.  Imaging:  L5 RFA + KP, 05/13/23 Adequate cement filling of the L5 vertebral body. No radiographic evidence of complication.    Labs:  CBC: Recent Labs    05/14/23 1231 06/02/23 1150 06/18/23 0959 07/01/23 1253  WBC 5.6 6.2 3.7* 1.6*  HGB 14.8 15.1* 13.3 12.1  HCT 44.4 44.4 39.5 35.3*  PLT 235 266 229 135*    COAGS: Recent Labs    12/26/22 0953 05/14/23 1231  INR 1.0 1.0    BMP: Recent Labs    05/14/23 1231 06/02/23 1150 06/18/23 0959 07/01/23 1253  NA 138 137 132* 132*  K 3.9 4.7 3.4* 3.6  CL 107 102 99 100  CO2 23 26 25 23   GLUCOSE 108* 94 109* 114*  BUN 17 11 8 11   CALCIUM 9.1 9.5 8.8* 8.8*  CREATININE 0.94 1.01* 1.41* 1.33*  GFRNONAA >60 >60 41* 44*    Staging:   Cancer Staging  Primary cancer of upper outer quadrant of right breast (HCC) Staging form: Breast, AJCC 8th Edition - Clinical stage from 11/24/2017: Stage IIIB (cT2, cN3b, cM0, G2, ER+, PR+, HER2-) - Unsigned Histopathologic type: Infiltrating duct carcinoma, NOS Nuclear grade: G2 Histologic grading system: 3 grade system Diagnostic confirmation: Positive histology Specimen type: Core Needle Biopsy Staged by: Managing physician HER2-IHC interpretation: Negative HER2-IHC value: Score 1+ Circulating tumor cells (CTC): Unknown Disseminated tumor cells: Not assessed Stage used in treatment planning: Yes National guidelines used in treatment planning: Yes Type of national guideline used in treatment planning: NCCN  Pathology, 05/13/23  FINAL DIAGNOSIS  Bone, biopsy METASTATIC ADENOCARCINOMA  COMPATIBLE WITH BREAST PRIMARY   Specimen Comment Hx breast CA, L5 HM lesion on PET   Specimen(s) Obtained: Bone, biopsy   Assessment and Plan:  67 y/o F w PMHx significant for R breast IDC and L breast DCIS Dx in 2019, s/p bilateral mastectomies. Pt p/w symptomatic, solitary osseous spinal (oligo-) metastasis at L5 s/p repeat vertebral body biopsy, and percutaneous RFA and stabilization w BKP.   *No post procedural concern.  *Pathology POSITIVE for metastatic  adenoCA c/w her breast primary *Further management by Med Onc / Rad Onc. *VIR will continue to follow routine cancer care surveillance imaging. *Return to Clinic PRN.   Thank you for allowing [place] to participate in the care of your Patient. Please contact me with questions, concerns, or if new issues arise.   Electronically Signed:  Roanna Banning, MD [facility]   Pager. [phone] Clinic. [phone]  I spent a total of 20 Minutes of non-face-to-face time in clinical consultation, greater than 50% of which was counseling/coordinating care for [name]'s evaluation for L5 bone metastasis treatment.

## 2023-06-30 ENCOUNTER — Other Ambulatory Visit: Payer: Self-pay

## 2023-06-30 ENCOUNTER — Ambulatory Visit
Admission: RE | Admit: 2023-06-30 | Discharge: 2023-06-30 | Disposition: A | Discharge status: Home / Self Care | Payer: Medicare PPO | Source: Ambulatory Visit | Attending: Radiation Oncology | Admitting: Radiation Oncology

## 2023-06-30 DIAGNOSIS — C7951 Secondary malignant neoplasm of bone: Secondary | ICD-10-CM | POA: Diagnosis not present

## 2023-06-30 DIAGNOSIS — Z170 Estrogen receptor positive status [ER+]: Secondary | ICD-10-CM | POA: Diagnosis not present

## 2023-06-30 DIAGNOSIS — C50411 Malignant neoplasm of upper-outer quadrant of right female breast: Secondary | ICD-10-CM | POA: Diagnosis not present

## 2023-06-30 DIAGNOSIS — Z8614 Personal history of Methicillin resistant Staphylococcus aureus infection: Secondary | ICD-10-CM | POA: Diagnosis not present

## 2023-06-30 DIAGNOSIS — Z85828 Personal history of other malignant neoplasm of skin: Secondary | ICD-10-CM | POA: Diagnosis not present

## 2023-06-30 DIAGNOSIS — Z79818 Long term (current) use of other agents affecting estrogen receptors and estrogen levels: Secondary | ICD-10-CM | POA: Diagnosis not present

## 2023-06-30 DIAGNOSIS — Z79811 Long term (current) use of aromatase inhibitors: Secondary | ICD-10-CM | POA: Diagnosis not present

## 2023-06-30 DIAGNOSIS — Z510 Encounter for antineoplastic radiation therapy: Secondary | ICD-10-CM | POA: Diagnosis not present

## 2023-06-30 DIAGNOSIS — Z9013 Acquired absence of bilateral breasts and nipples: Secondary | ICD-10-CM | POA: Diagnosis not present

## 2023-06-30 LAB — RAD ONC ARIA SESSION SUMMARY
Course Elapsed Days: 12
Plan Fractions Treated to Date: 5
Plan Prescribed Dose Per Fraction: 6 Gy
Plan Total Fractions Prescribed: 5
Plan Total Prescribed Dose: 30 Gy
Reference Point Dosage Given to Date: 30 Gy
Reference Point Session Dosage Given: 6 Gy
Session Number: 5

## 2023-07-01 ENCOUNTER — Inpatient Hospital Stay: Payer: Medicare PPO

## 2023-07-01 ENCOUNTER — Inpatient Hospital Stay: Payer: Medicare PPO | Attending: Oncology

## 2023-07-01 DIAGNOSIS — D702 Other drug-induced agranulocytosis: Secondary | ICD-10-CM | POA: Insufficient documentation

## 2023-07-01 DIAGNOSIS — C773 Secondary and unspecified malignant neoplasm of axilla and upper limb lymph nodes: Secondary | ICD-10-CM | POA: Insufficient documentation

## 2023-07-01 DIAGNOSIS — Z87891 Personal history of nicotine dependence: Secondary | ICD-10-CM | POA: Insufficient documentation

## 2023-07-01 DIAGNOSIS — Z9013 Acquired absence of bilateral breasts and nipples: Secondary | ICD-10-CM | POA: Diagnosis not present

## 2023-07-01 DIAGNOSIS — Z5111 Encounter for antineoplastic chemotherapy: Secondary | ICD-10-CM | POA: Diagnosis not present

## 2023-07-01 DIAGNOSIS — R7989 Other specified abnormal findings of blood chemistry: Secondary | ICD-10-CM | POA: Diagnosis not present

## 2023-07-01 DIAGNOSIS — C50911 Malignant neoplasm of unspecified site of right female breast: Secondary | ICD-10-CM

## 2023-07-01 DIAGNOSIS — Z170 Estrogen receptor positive status [ER+]: Secondary | ICD-10-CM | POA: Insufficient documentation

## 2023-07-01 DIAGNOSIS — C7951 Secondary malignant neoplasm of bone: Secondary | ICD-10-CM | POA: Insufficient documentation

## 2023-07-01 DIAGNOSIS — C50411 Malignant neoplasm of upper-outer quadrant of right female breast: Secondary | ICD-10-CM | POA: Diagnosis not present

## 2023-07-01 LAB — CBC WITH DIFFERENTIAL/PLATELET
Abs Immature Granulocytes: 0 10*3/uL (ref 0.00–0.07)
Basophils Absolute: 0 10*3/uL (ref 0.0–0.1)
Basophils Relative: 1 %
Eosinophils Absolute: 0 10*3/uL (ref 0.0–0.5)
Eosinophils Relative: 3 %
HCT: 35.3 % — ABNORMAL LOW (ref 36.0–46.0)
Hemoglobin: 12.1 g/dL (ref 12.0–15.0)
Immature Granulocytes: 0 %
Lymphocytes Relative: 37 %
Lymphs Abs: 0.6 10*3/uL — ABNORMAL LOW (ref 0.7–4.0)
MCH: 30.6 pg (ref 26.0–34.0)
MCHC: 34.3 g/dL (ref 30.0–36.0)
MCV: 89.4 fL (ref 80.0–100.0)
Monocytes Absolute: 0.2 10*3/uL (ref 0.1–1.0)
Monocytes Relative: 11 %
Neutro Abs: 0.8 10*3/uL — ABNORMAL LOW (ref 1.7–7.7)
Neutrophils Relative %: 48 %
Platelets: 135 10*3/uL — ABNORMAL LOW (ref 150–400)
RBC: 3.95 MIL/uL (ref 3.87–5.11)
RDW: 12.9 % (ref 11.5–15.5)
WBC: 1.6 10*3/uL — ABNORMAL LOW (ref 4.0–10.5)
nRBC: 0 % (ref 0.0–0.2)

## 2023-07-01 LAB — COMPREHENSIVE METABOLIC PANEL
ALT: 20 U/L (ref 0–44)
AST: 27 U/L (ref 15–41)
Albumin: 3.9 g/dL (ref 3.5–5.0)
Alkaline Phosphatase: 56 U/L (ref 38–126)
Anion gap: 9 (ref 5–15)
BUN: 11 mg/dL (ref 8–23)
CO2: 23 mmol/L (ref 22–32)
Calcium: 8.8 mg/dL — ABNORMAL LOW (ref 8.9–10.3)
Chloride: 100 mmol/L (ref 98–111)
Creatinine, Ser: 1.33 mg/dL — ABNORMAL HIGH (ref 0.44–1.00)
GFR, Estimated: 44 mL/min — ABNORMAL LOW (ref >60)
Glucose, Bld: 114 mg/dL — ABNORMAL HIGH (ref 70–99)
Potassium: 3.6 mmol/L (ref 3.5–5.1)
Sodium: 132 mmol/L — ABNORMAL LOW (ref 135–145)
Total Bilirubin: 0.9 mg/dL (ref 0.3–1.2)
Total Protein: 7 g/dL (ref 6.5–8.1)

## 2023-07-01 MED ORDER — FULVESTRANT 250 MG/5ML IM SOSY
500.0000 mg | PREFILLED_SYRINGE | Freq: Once | INTRAMUSCULAR | Status: AC
  Administered 2023-07-01: 500 mg via INTRAMUSCULAR
  Filled 2023-07-01: qty 10

## 2023-07-02 ENCOUNTER — Other Ambulatory Visit: Payer: Medicare PPO

## 2023-07-07 ENCOUNTER — Other Ambulatory Visit: Payer: Medicare PPO

## 2023-07-07 ENCOUNTER — Ambulatory Visit: Payer: Medicare PPO | Admitting: Oncology

## 2023-07-15 ENCOUNTER — Other Ambulatory Visit: Payer: Self-pay | Admitting: *Deleted

## 2023-07-15 DIAGNOSIS — C50411 Malignant neoplasm of upper-outer quadrant of right female breast: Secondary | ICD-10-CM

## 2023-07-16 ENCOUNTER — Inpatient Hospital Stay (HOSPITAL_BASED_OUTPATIENT_CLINIC_OR_DEPARTMENT_OTHER): Payer: Medicare PPO | Admitting: Oncology

## 2023-07-16 ENCOUNTER — Inpatient Hospital Stay: Payer: Medicare PPO

## 2023-07-16 ENCOUNTER — Encounter: Payer: Self-pay | Admitting: Oncology

## 2023-07-16 ENCOUNTER — Other Ambulatory Visit: Payer: Self-pay

## 2023-07-16 ENCOUNTER — Other Ambulatory Visit: Payer: Self-pay | Admitting: Pharmacist

## 2023-07-16 ENCOUNTER — Other Ambulatory Visit (HOSPITAL_COMMUNITY): Payer: Self-pay

## 2023-07-16 VITALS — BP 106/62 | HR 88 | Temp 97.9°F | Resp 18 | Ht 65.0 in | Wt 145.3 lb

## 2023-07-16 DIAGNOSIS — C50411 Malignant neoplasm of upper-outer quadrant of right female breast: Secondary | ICD-10-CM

## 2023-07-16 DIAGNOSIS — C7951 Secondary malignant neoplasm of bone: Secondary | ICD-10-CM | POA: Diagnosis not present

## 2023-07-16 DIAGNOSIS — D702 Other drug-induced agranulocytosis: Secondary | ICD-10-CM | POA: Diagnosis not present

## 2023-07-16 DIAGNOSIS — C773 Secondary and unspecified malignant neoplasm of axilla and upper limb lymph nodes: Secondary | ICD-10-CM | POA: Diagnosis not present

## 2023-07-16 DIAGNOSIS — R7989 Other specified abnormal findings of blood chemistry: Secondary | ICD-10-CM | POA: Diagnosis not present

## 2023-07-16 DIAGNOSIS — Z79899 Other long term (current) drug therapy: Secondary | ICD-10-CM

## 2023-07-16 DIAGNOSIS — Z79818 Long term (current) use of other agents affecting estrogen receptors and estrogen levels: Secondary | ICD-10-CM

## 2023-07-16 DIAGNOSIS — Z170 Estrogen receptor positive status [ER+]: Secondary | ICD-10-CM | POA: Diagnosis not present

## 2023-07-16 DIAGNOSIS — Z9013 Acquired absence of bilateral breasts and nipples: Secondary | ICD-10-CM | POA: Diagnosis not present

## 2023-07-16 DIAGNOSIS — Z5111 Encounter for antineoplastic chemotherapy: Secondary | ICD-10-CM | POA: Diagnosis not present

## 2023-07-16 DIAGNOSIS — Z87891 Personal history of nicotine dependence: Secondary | ICD-10-CM | POA: Diagnosis not present

## 2023-07-16 LAB — CBC WITH DIFFERENTIAL/PLATELET
Abs Immature Granulocytes: 0 10*3/uL (ref 0.00–0.07)
Basophils Absolute: 0.1 10*3/uL (ref 0.0–0.1)
Basophils Relative: 9 %
Eosinophils Absolute: 0.1 10*3/uL (ref 0.0–0.5)
Eosinophils Relative: 11 %
HCT: 32 % — ABNORMAL LOW (ref 36.0–46.0)
Hemoglobin: 10.9 g/dL — ABNORMAL LOW (ref 12.0–15.0)
Immature Granulocytes: 0 %
Lymphocytes Relative: 53 %
Lymphs Abs: 0.4 10*3/uL — ABNORMAL LOW (ref 0.7–4.0)
MCH: 30.8 pg (ref 26.0–34.0)
MCHC: 34.1 g/dL (ref 30.0–36.0)
MCV: 90.4 fL (ref 80.0–100.0)
Monocytes Absolute: 0.1 10*3/uL (ref 0.1–1.0)
Monocytes Relative: 20 %
Neutro Abs: 0.1 10*3/uL — CL (ref 1.7–7.7)
Neutrophils Relative %: 7 %
Platelets: 218 10*3/uL (ref 150–400)
RBC: 3.54 MIL/uL — ABNORMAL LOW (ref 3.87–5.11)
RDW: 16.2 % — ABNORMAL HIGH (ref 11.5–15.5)
WBC: 0.7 10*3/uL — CL (ref 4.0–10.5)
nRBC: 0 % (ref 0.0–0.2)

## 2023-07-16 LAB — COMPREHENSIVE METABOLIC PANEL
ALT: 53 U/L — ABNORMAL HIGH (ref 0–44)
AST: 56 U/L — ABNORMAL HIGH (ref 15–41)
Albumin: 3.8 g/dL (ref 3.5–5.0)
Alkaline Phosphatase: 71 U/L (ref 38–126)
Anion gap: 6 (ref 5–15)
BUN: 11 mg/dL (ref 8–23)
CO2: 25 mmol/L (ref 22–32)
Calcium: 9 mg/dL (ref 8.9–10.3)
Chloride: 105 mmol/L (ref 98–111)
Creatinine, Ser: 1.12 mg/dL — ABNORMAL HIGH (ref 0.44–1.00)
GFR, Estimated: 54 mL/min — ABNORMAL LOW (ref >60)
Glucose, Bld: 110 mg/dL — ABNORMAL HIGH (ref 70–99)
Potassium: 3.9 mmol/L (ref 3.5–5.1)
Sodium: 136 mmol/L (ref 135–145)
Total Bilirubin: 0.3 mg/dL (ref 0.3–1.2)
Total Protein: 6.7 g/dL (ref 6.5–8.1)

## 2023-07-16 MED ORDER — FULVESTRANT 250 MG/5ML IM SOSY
500.0000 mg | PREFILLED_SYRINGE | Freq: Once | INTRAMUSCULAR | Status: AC
  Administered 2023-07-16: 500 mg via INTRAMUSCULAR
  Filled 2023-07-16: qty 10

## 2023-07-16 MED ORDER — ABEMACICLIB 100 MG PO TABS
100.0000 mg | ORAL_TABLET | Freq: Two times a day (BID) | ORAL | 0 refills | Status: AC
Start: 2023-07-16 — End: ?
  Filled 2023-07-16 – 2023-07-30 (×2): qty 56, 28d supply, fill #0

## 2023-07-16 NOTE — Progress Notes (Signed)
Hematology/Oncology Consult note [facility]  [phone] [phone]  Patient Care Team: Allegra Grana, FNP as PCP - General (Family Medicine) Jim Like, RN as Oncology Nurse Navigator Byrnett, Merrily Pew, MD (General Surgery) Glori Luis, MD as Consulting Physician (Family Medicine) Lonell Face, MD as Consulting Physician (Neurology) Creig Hines, MD as Consulting Physician (Oncology) Carmina Miller, MD as Consulting Physician (Radiation Oncology)   Name of the patient: Megan Vega  [id number]  12-Nov-1956   Date of visit: 07/16/23  Diagnosis- history of ER/PR positive HER2 negative breast cancer now with isolated L5 bone metastases stage III right breast cancer and left breast DCIS in 2019   Chief complaint/ Reason for visit-routine follow-up of breast cancer on Verzenio and Faslodex  Heme/Onc history: Megan Vega is a 67 y.o. female with clinical stage [id number] right breast cancer and DCIS in the left breast s/p neoadjuvant chemotherapy followed by left simple mastectomy and right modified radical mastectomy on 07/10/2018.  Biopsy showed 1.4 cm grade 2 invasive mammary carcinoma ER greater than 90% positive PR greater than 90% positive and HER2 negative +1   Patient received neoadjuvant AC Taxol chemotherapy until July 2019.   Left breast pathology revealed 2.8 cm residual high grade DCIS, comedo type.  There was atypical lobular hyperplasia.  Four sentinel lymph nodes were negative.  Right breast pathology revealed 3.6 cm residual grade II invasive mammary carcinoma and high grade DCIS.  There was lymphovascular invasion.There was metastatic carcinoma in 2 sentinel lymph nodes.  Right axillary dissection revealed metastatic carcinoma in 7 lymph nodes.  There were a total of 9 lymph nodes with macrometastasis (largest 22 mm).  Pathologic stage was ypT2 ypN2a.   Patient then received adjuvant radiation  treatment and started letrozole in February 2020   She underwent right breast reconstruction with latissimus myocutaneous flap and removal of her port-a-cath on 02/07/2020.  Expanders were taken out in 05/2020 secondary to infection.  She underwent excision of left breast wound with closure on 10/02/2020. Pathology revealed inflamed granulation tissue and fibrosis. There was no evidence of malignancy.  She received adjuvant Zometa for 3 years   Patient found to have L5 vertebral lesion that was hypermetabolic on PET scan.  Biopsies negative for malignancy.  There was no lesions seen elsewhere.  A repeat scans in May 2024 again confirmed the presence of L5 vertebral lesion and patient had a second biopsy with osteo-cool procedure/kyphoplasty.Biopsy was consistent with metastatic adenocarcinoma compatible with breast primary.  ER 100% positive, PR 5% positive and HER2 negative  Interval history-patient does report having significant GI discomfort after starting Verzenio.  Symptoms were harder when she first started taking that but now she has gotten around to get out what food she needs to avoid and adjust her diet accordingly.  She reports ongoing fatigue.  Denies any fever  ECOG PS- 1 Pain scale- 0   Review of systems- Review of Systems  Constitutional:  Positive for malaise/fatigue. Negative for chills, fever and weight loss.  HENT:  Negative for congestion, ear discharge and nosebleeds.   Eyes:  Negative for blurred vision.  Respiratory:  Negative for cough, hemoptysis, sputum production, shortness of breath and wheezing.   Cardiovascular:  Negative for chest pain, palpitations, orthopnea and claudication.  Gastrointestinal:  Positive for abdominal pain and nausea. Negative for blood in stool, constipation, diarrhea, heartburn, melena and vomiting.  Genitourinary:  Negative for dysuria, flank pain, frequency, hematuria and urgency.  Musculoskeletal:  Negative for back pain, joint pain and  myalgias.  Skin:  Negative for rash.  Neurological:  Negative for dizziness, tingling, focal weakness, seizures, weakness and headaches.  Endo/Heme/Allergies:  Does not bruise/bleed easily.  Psychiatric/Behavioral:  Negative for depression and suicidal ideas. The patient does not have insomnia.       Allergies  Allergen Reactions   Peanut-Containing Drug Products Swelling and Other (See Comments)    Only when eating too many peanuts, swelling with lips and tingly face     Past Medical History:  Diagnosis Date   Ankle fracture    Breast cancer (HCC)    bilateral   Cancer (HCC)    Basal Cell Carcinoma, about 2011 chest   Cataract    Family history of adverse reaction to anesthesia    daughter with PONV   Hemorrhoids    History of methicillin resistant staphylococcus aureus (MRSA) 2015   Migraines    MIGRAINES occ   Personal history of chemotherapy    Personal history of radiation therapy    Wears glasses      Past Surgical History:  Procedure Laterality Date   BREAST BIOPSY Bilateral 10/29/2017   L breast DCIS, R breast invasive mammary carcinoma of no special type, ER/PR+, Her2/neu 1+   BREAST RECONSTRUCTION Right 02/07/2020   Procedure: BREAST RECONSTRUCTION;  Surgeon: Peggye Form, DO;  Location: [facility];  Service: Plastics;  Laterality: Right;   BREAST RECONSTRUCTION WITH PLACEMENT OF TISSUE EXPANDER AND FLEX HD (ACELLULAR HYDRATED DERMIS) Bilateral 08/30/2019   Procedure: BREAST RECONSTRUCTION WITH PLACEMENT OF TISSUE EXPANDER AND FLEX HD (ACELLULAR HYDRATED DERMIS);  Surgeon: Peggye Form, DO;  Location: [facility];  Service: Plastics;  Laterality: Bilateral;  2.5 hours, please   CESAREAN SECTION     COLONOSCOPY  2012   COLONOSCOPY WITH PROPOFOL N/A 12/01/2018   Procedure: COLONOSCOPY WITH PROPOFOL;  Surgeon: Midge Minium, MD;  Location: [facility];  Service: Endoscopy;  Laterality: N/A;   DEBRIDEMENT AND CLOSURE WOUND Bilateral 10/02/2020    Procedure: Excision of bilateral breast wound;  Surgeon: Peggye Form, DO;  Location: [facility];  Service: Plastics;  Laterality: Bilateral;  1.5 hours, please   FRACTURE SURGERY     ankle   IR CT BONE TROCAR/NEEDLE BIOPSY DEEP  12/26/2022   IR KYPHO LUMBAR INC FX REDUCE BONE BX UNI/BIL CANNULATION INC/IMAGING  05/14/2023   IR RADIOLOGIST EVAL & MGMT  04/28/2023   IR RADIOLOGIST EVAL & MGMT  06/27/2023   IRRIGATION AND DEBRIDEMENT ABSCESS Right 03/12/2018   Procedure: IRRIGATION AND DEBRIDEMENT RIGHT AXILLARY ABSCESS;  Surgeon: Earline Mayotte, MD;  Location: [facility];  Service: General;  Laterality: Right;   LATISSIMUS FLAP TO BREAST Right 02/07/2020   Procedure: LATISSIMUS MYOCUTANEOUS FLAP TO BREAST;  Surgeon: Peggye Form, DO;  Location: [facility];  Service: Plastics;  Laterality: Right;  3 hours   MANDIBLE FRACTURE SURGERY     MASTECTOMY     MASTECTOMY MODIFIED RADICAL Right 07/10/2018   ypT2 ypN2a ER/PR positive, HER-2/neu negative  Surgeon: Earline Mayotte, MD;  Location: [facility];  Service: General;  Laterality: Right;   PORT-A-CATH REMOVAL Left 02/07/2020   Procedure: REMOVAL PORT-A-CATH;  Surgeon: Peggye Form, DO;  Location: [facility];  Service: Plastics;  Laterality: Left;   PORTACATH PLACEMENT Left 11/28/2017   Procedure: INSERTION PORT-A-CATH;  Surgeon: Earline Mayotte, MD;  Location: [facility];  Service: General;  Laterality: Left;   REMOVAL OF TISSUE EXPANDER Right 06/05/2020  Procedure: TISSUE EXPANDER REMOVAL;  Surgeon: Peggye Form, DO;  Location: [facility];  Service: Plastics;  Laterality: Right;   SIMPLE MASTECTOMY WITH AXILLARY SENTINEL NODE BIOPSY Left 07/10/2018   High-grade DCIS SIMPLE MASTECTOMY;  Surgeon: Earline Mayotte, MD;  Location: [facility];  Service: General;  Laterality: Left;   TISSUE EXPANDER PLACEMENT Left 06/05/2020   Procedure: TISSUE EXPANDER FLUID REMOVAL-100ML;  Surgeon: Peggye Form, DO;  Location: [facility];  Service: Plastics;  Laterality: Left;   TISSUE EXPANDER PLACEMENT Left 06/14/2020   Procedure: TISSUE EXPANDER REMOVAL;  Surgeon: Peggye Form, DO;  Location: [facility];  Service: Plastics;  Laterality: Left;  45 min, please    Social History   Socioeconomic History   Marital status: Divorced    Spouse name: Not on file   Number of children: 2   Years of education: Not on file   Highest education level: Not on file  Occupational History   Occupation: Retired  Tobacco Use   Smoking status: Former    Current packs/day: 0.00    Average packs/day: 0.3 packs/day for 10.0 years (2.5 ttl pk-yrs)    Types: Cigarettes    Start date: 41    Quit date: 1978    Years since quitting: 46.6   Smokeless tobacco: Never  Vaping Use   Vaping status: Never Used  Substance and Sexual Activity   Alcohol use: Yes    Alcohol/week: 1.0 standard drink of alcohol    Types: 1 Glasses of wine per week    Comment: " occasional glass of wine "   Drug use: No   Sexual activity: Not Currently    Birth control/protection: Post-menopausal  Other Topics Concern   Not on file  Social History Narrative   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    Lives at home (son 64 yo lives with her)   Children 2   Pets: 3 small dogs and 1 frog    Caffeine- 1 cup of coffee in the morning, rare tea   Social Determinants of Health   Financial Resource Strain: Low Risk  (04/02/2023)   Overall Financial Resource Strain (CARDIA)    Difficulty of Paying Living Expenses: Not hard at all  Food Insecurity: No Food Insecurity (04/02/2023)   Hunger Vital Sign    Worried About Running Out of Food in the Last Year: Never true    Ran Out of Food in the Last Year: Never true  Transportation Needs: No Transportation Needs (04/02/2023)   PRAPARE - Administrator, Civil Service (Medical): No    Lack of Transportation (Non-Medical): No  Physical Activity: Sufficiently Active (04/02/2023)   Exercise Vital Sign     Days of Exercise per Week: 7 days    Minutes of Exercise per Session: 30 min  Stress: No Stress Concern Present (04/02/2023)   [employer] of Occupational Health - Occupational Stress Questionnaire    Feeling of Stress : Not at all  Social Connections: Moderately Isolated (04/02/2023)   Social Connection and Isolation Panel [NHANES]    Frequency of Communication with Friends and Family: More than three times a week    Frequency of Social Gatherings with Friends and Family: Once a week    Attends Religious Services: More than 4 times per year    Active Member of [employer] or Organizations: No    Attends Banker Meetings: Never    Marital Status: Divorced  Catering manager Violence: Not At Risk (04/02/2023)  Humiliation, Afraid, Rape, and Kick questionnaire    Fear of Current or Ex-Partner: No    Emotionally Abused: No    Physically Abused: No    Sexually Abused: No    Family History  Problem Relation Age of Onset   Cancer Mother        Esophageal Cancer   Early death Mother        Age 37   Cancer Father        Lung Cancer   Diabetes Father    Diabetes Brother        Controlled with diet   Heart attack Paternal Grandfather    Colon cancer Neg Hx      Current Outpatient Medications:    abemaciclib (VERZENIO) 150 MG tablet, Take 1 tablet (150 mg total) by mouth 2 (two) times daily., Disp: 56 tablet, Rfl: 0   b complex vitamins tablet, Take 1 tablet by mouth daily., Disp: , Rfl:    Calcium-Magnesium-Vitamin D (CALCIUM 1200+D3 PO), Take 1,200 mg by mouth daily. , Disp: , Rfl:    diphenoxylate-atropine (LOMOTIL) 2.5-0.025 MG tablet, Take 2 tablets by mouth 4 (four) times daily as needed for diarrhea or loose stools., Disp: 30 tablet, Rfl: 1   loperamide (IMODIUM) 2 MG capsule, Take 2 tabs by mouth with first loose stool, then 1 tab with each additional loose stool as needed. Do not exceed 8 tabs in a 24-hour period, Disp: 60 capsule, Rfl: 2   loratadine (CLARITIN) 10  MG tablet, Take 10 mg by mouth every morning. , Disp: , Rfl:    Melatonin 5 MG CAPS, Take 5 mg by mouth daily as needed (sleep). , Disp: , Rfl:    Multiple Vitamin (MULTIVITAMIN WITH MINERALS) TABS tablet, Take 1 tablet by mouth daily. Centrum Silver, Disp: , Rfl:    Naphazoline HCl (CLEAR EYES OP), Place 1 drop into both eyes daily., Disp: , Rfl:    ondansetron (ZOFRAN) 8 MG tablet, Take 1 tablet (8 mg total) by mouth every 8 (eight) hours as needed for nausea or vomiting., Disp: 15 tablet, Rfl: 0   Probiotic Product (PROBIOTIC PO), Take 1 capsule by mouth daily., Disp: , Rfl:    prochlorperazine (COMPAZINE) 10 MG tablet, Take 1 tablet (10 mg total) by mouth every 6 (six) hours as needed for nausea or vomiting., Disp: 30 tablet, Rfl: 1   traMADol (ULTRAM) 50 MG tablet, Take 1 tablet (50 mg total) by mouth every 6 (six) hours as needed for moderate pain., Disp: 10 tablet, Rfl: 0 No current facility-administered medications for this visit.  Facility-Administered Medications Ordered in Other Visits:    heparin lock flush 100 unit/mL, 500 Units, Intracatheter, PRN, Rosey Bath, MD  Physical exam:  Vitals:   07/16/23 1015  BP: 106/62  Pulse: 88  Resp: 18  Temp: 97.9 F (36.6 C)  TempSrc: Temporal  SpO2: 95%  Weight: 145 lb 4.8 oz (65.9 kg)  Height: 5\' 5"  (1.651 m)   Physical Exam HENT:     Mouth/Throat:     Mouth: Mucous membranes are moist.     Pharynx: Oropharynx is clear.  Cardiovascular:     Rate and Rhythm: Normal rate and regular rhythm.     Heart sounds: Normal heart sounds.  Pulmonary:     Effort: Pulmonary effort is normal.     Breath sounds: Normal breath sounds.  Skin:    General: Skin is warm and dry.  Neurological:     Mental Status: She is  alert and oriented to person, place, and time.         Latest Ref Rng & Units 07/16/2023   10:02 AM  CMP  Glucose 70 - 99 mg/dL 580   BUN 8 - 23 mg/dL 11   Creatinine 9.98 - 1.00 mg/dL 3.38   Sodium 250 - 539  mmol/L 136   Potassium 3.5 - 5.1 mmol/L 3.9   Chloride 98 - 111 mmol/L 105   CO2 22 - 32 mmol/L 25   Calcium 8.9 - 10.3 mg/dL 9.0   Total Protein 6.5 - 8.1 g/dL 6.7   Total Bilirubin 0.3 - 1.2 mg/dL 0.3   Alkaline Phos 38 - 126 U/L 71   AST 15 - 41 U/L 56   ALT 0 - 44 U/L 53       Latest Ref Rng & Units 07/16/2023   10:02 AM  CBC  WBC 4.0 - 10.5 K/uL 0.7   Hemoglobin 12.0 - 15.0 g/dL 76.7   Hematocrit 34.1 - 46.0 % 32.0   Platelets 150 - 400 K/uL 218     No images are attached to the encounter.  IR Radiologist Eval & Mgmt  Result Date: 06/27/2023 EXAM: ESTABLISHED PATIENT OFFICE VISIT CHIEF COMPLAINT: See below HISTORY OF PRESENT ILLNESS: See below REVIEW OF SYSTEMS: See below PHYSICAL EXAMINATION: See below ASSESSMENT AND PLAN: Please refer to completed note in the electronic medical record on [facility] Roanna Banning, MD [facility] Radiology Electronically Signed   By: Roanna Banning M.D.   On: 06/27/2023 09:07     Assessment and plan- Patient is a 67 y.o. female with history of stage III left breast cancer in 2019 now presenting with isolated L5 metastatic lesion biopsy-proven ER 100% positive, PR 5% positive and HER2 negative.  She is currently on Verzenio plus Faslodex and this is a routine follow-up visit  Patient will be getting her fourth dose of Faslodex today and then moving forward this will be on a monthly basis.  Her baseline tumor markers are normal.  Patient is currently on full dose of Verzenio 150 mg twice daily.  She had a mild elevation in her serum creatinine which is back to her baseline presently at 1.1.  AST ALT are mildly abnormal at 56 and 53 respectively.  Her white count is 0.7 with an ANC of 0.1 secondary to [employer].  I have asked her to hold off on taking Verzenio altogether until her neutropenia improves to ANC more than 1.  I will plan to give her a lower dose of Verzenio after her counts improve and  start her on 100 mg twice daily instead.  CBC with differential on a weekly basis for the next 4 weeks and I will see her back in 4 weeks for the next dose of Faslodex.  Given that she had an isolated bone met I will also plan on starting Zometa every 3 months at that time   Visit Diagnosis 1. Primary cancer of upper outer quadrant of right breast (HCC)   2. Drug-induced neutropenia (HCC)   3. High risk medication use   4. Use of fulvestrant (Faslodex)      Dr. Owens Shark, MD, MPH [facility] at [facility] [phone] 07/16/2023 1:07 PM

## 2023-07-22 ENCOUNTER — Other Ambulatory Visit (HOSPITAL_COMMUNITY): Payer: Self-pay

## 2023-07-23 ENCOUNTER — Inpatient Hospital Stay: Payer: Medicare PPO

## 2023-07-23 ENCOUNTER — Telehealth: Payer: Self-pay | Admitting: *Deleted

## 2023-07-23 DIAGNOSIS — Z801 Family history of malignant neoplasm of trachea, bronchus and lung: Secondary | ICD-10-CM | POA: Insufficient documentation

## 2023-07-23 DIAGNOSIS — R110 Nausea: Secondary | ICD-10-CM | POA: Diagnosis not present

## 2023-07-23 DIAGNOSIS — Z170 Estrogen receptor positive status [ER+]: Secondary | ICD-10-CM | POA: Diagnosis not present

## 2023-07-23 DIAGNOSIS — R197 Diarrhea, unspecified: Secondary | ICD-10-CM | POA: Insufficient documentation

## 2023-07-23 DIAGNOSIS — Z5111 Encounter for antineoplastic chemotherapy: Secondary | ICD-10-CM | POA: Insufficient documentation

## 2023-07-23 DIAGNOSIS — C50411 Malignant neoplasm of upper-outer quadrant of right female breast: Secondary | ICD-10-CM

## 2023-07-23 DIAGNOSIS — Z8614 Personal history of Methicillin resistant Staphylococcus aureus infection: Secondary | ICD-10-CM | POA: Insufficient documentation

## 2023-07-23 DIAGNOSIS — Z833 Family history of diabetes mellitus: Secondary | ICD-10-CM | POA: Insufficient documentation

## 2023-07-23 DIAGNOSIS — Z9221 Personal history of antineoplastic chemotherapy: Secondary | ICD-10-CM | POA: Insufficient documentation

## 2023-07-23 DIAGNOSIS — Z923 Personal history of irradiation: Secondary | ICD-10-CM | POA: Insufficient documentation

## 2023-07-23 DIAGNOSIS — Z85828 Personal history of other malignant neoplasm of skin: Secondary | ICD-10-CM | POA: Insufficient documentation

## 2023-07-23 DIAGNOSIS — Z8719 Personal history of other diseases of the digestive system: Secondary | ICD-10-CM | POA: Insufficient documentation

## 2023-07-23 DIAGNOSIS — Z8249 Family history of ischemic heart disease and other diseases of the circulatory system: Secondary | ICD-10-CM | POA: Insufficient documentation

## 2023-07-23 DIAGNOSIS — C7951 Secondary malignant neoplasm of bone: Secondary | ICD-10-CM | POA: Diagnosis not present

## 2023-07-23 DIAGNOSIS — Z87891 Personal history of nicotine dependence: Secondary | ICD-10-CM | POA: Insufficient documentation

## 2023-07-23 DIAGNOSIS — Z79899 Other long term (current) drug therapy: Secondary | ICD-10-CM | POA: Diagnosis not present

## 2023-07-23 DIAGNOSIS — C773 Secondary and unspecified malignant neoplasm of axilla and upper limb lymph nodes: Secondary | ICD-10-CM | POA: Diagnosis not present

## 2023-07-23 LAB — COMPREHENSIVE METABOLIC PANEL
ALT: 39 U/L (ref 0–44)
AST: 38 U/L (ref 15–41)
Albumin: 3.8 g/dL (ref 3.5–5.0)
Alkaline Phosphatase: 70 U/L (ref 38–126)
Anion gap: 6 (ref 5–15)
BUN: 12 mg/dL (ref 8–23)
CO2: 25 mmol/L (ref 22–32)
Calcium: 8.8 mg/dL — ABNORMAL LOW (ref 8.9–10.3)
Chloride: 104 mmol/L (ref 98–111)
Creatinine, Ser: 1.07 mg/dL — ABNORMAL HIGH (ref 0.44–1.00)
GFR, Estimated: 57 mL/min — ABNORMAL LOW (ref >60)
Glucose, Bld: 129 mg/dL — ABNORMAL HIGH (ref 70–99)
Potassium: 4.3 mmol/L (ref 3.5–5.1)
Sodium: 135 mmol/L (ref 135–145)
Total Bilirubin: 0.6 mg/dL (ref 0.3–1.2)
Total Protein: 6.7 g/dL (ref 6.5–8.1)

## 2023-07-23 LAB — CBC WITH DIFFERENTIAL/PLATELET
Abs Immature Granulocytes: 0.02 10*3/uL (ref 0.00–0.07)
Basophils Absolute: 0.1 10*3/uL (ref 0.0–0.1)
Basophils Relative: 5 %
Eosinophils Absolute: 0.1 10*3/uL (ref 0.0–0.5)
Eosinophils Relative: 8 %
HCT: 31.8 % — ABNORMAL LOW (ref 36.0–46.0)
Hemoglobin: 10.7 g/dL — ABNORMAL LOW (ref 12.0–15.0)
Immature Granulocytes: 2 %
Lymphocytes Relative: 42 %
Lymphs Abs: 0.6 10*3/uL — ABNORMAL LOW (ref 0.7–4.0)
MCH: 31.4 pg (ref 26.0–34.0)
MCHC: 33.6 g/dL (ref 30.0–36.0)
MCV: 93.3 fL (ref 80.0–100.0)
Monocytes Absolute: 0.4 10*3/uL (ref 0.1–1.0)
Monocytes Relative: 29 %
Neutro Abs: 0.2 10*3/uL — CL (ref 1.7–7.7)
Neutrophils Relative %: 14 %
Platelets: 185 10*3/uL (ref 150–400)
RBC: 3.41 MIL/uL — ABNORMAL LOW (ref 3.87–5.11)
RDW: 17.3 % — ABNORMAL HIGH (ref 11.5–15.5)
WBC: 1.3 10*3/uL — ABNORMAL LOW (ref 4.0–10.5)
nRBC: 0 % (ref 0.0–0.2)

## 2023-07-23 NOTE — Telephone Encounter (Addendum)
I sent message to Smith Robert about the anc 0.2. rao said to call pt and let her know that she will not get  verzenio this week.   Called the patient and let her know that the ANC was 0.2 so she will not get any of the Verzenio this week.  She has another appointment on 9/11 and based on the labs next week we we will let patient know about the labs next time and see if she can start back with the Verzenio.  She is okay with that

## 2023-07-24 ENCOUNTER — Other Ambulatory Visit (HOSPITAL_COMMUNITY): Payer: Self-pay

## 2023-07-29 ENCOUNTER — Other Ambulatory Visit: Payer: Medicare PPO

## 2023-07-29 ENCOUNTER — Ambulatory Visit: Payer: Medicare PPO

## 2023-07-30 ENCOUNTER — Inpatient Hospital Stay: Payer: Medicare PPO

## 2023-07-30 ENCOUNTER — Other Ambulatory Visit (HOSPITAL_COMMUNITY): Payer: Self-pay

## 2023-07-30 ENCOUNTER — Telehealth: Payer: Self-pay | Admitting: Pharmacist

## 2023-07-30 ENCOUNTER — Other Ambulatory Visit: Payer: Self-pay

## 2023-07-30 DIAGNOSIS — Z170 Estrogen receptor positive status [ER+]: Secondary | ICD-10-CM | POA: Diagnosis not present

## 2023-07-30 DIAGNOSIS — R110 Nausea: Secondary | ICD-10-CM | POA: Diagnosis not present

## 2023-07-30 DIAGNOSIS — C7951 Secondary malignant neoplasm of bone: Secondary | ICD-10-CM | POA: Diagnosis not present

## 2023-07-30 DIAGNOSIS — R197 Diarrhea, unspecified: Secondary | ICD-10-CM | POA: Diagnosis not present

## 2023-07-30 DIAGNOSIS — C50411 Malignant neoplasm of upper-outer quadrant of right female breast: Secondary | ICD-10-CM

## 2023-07-30 DIAGNOSIS — C773 Secondary and unspecified malignant neoplasm of axilla and upper limb lymph nodes: Secondary | ICD-10-CM | POA: Diagnosis not present

## 2023-07-30 DIAGNOSIS — Z923 Personal history of irradiation: Secondary | ICD-10-CM | POA: Diagnosis not present

## 2023-07-30 DIAGNOSIS — Z5111 Encounter for antineoplastic chemotherapy: Secondary | ICD-10-CM | POA: Diagnosis not present

## 2023-07-30 DIAGNOSIS — Z79899 Other long term (current) drug therapy: Secondary | ICD-10-CM | POA: Diagnosis not present

## 2023-07-30 LAB — CBC WITH DIFFERENTIAL/PLATELET
Abs Immature Granulocytes: 0.11 10*3/uL — ABNORMAL HIGH (ref 0.00–0.07)
Basophils Absolute: 0.1 10*3/uL (ref 0.0–0.1)
Basophils Relative: 3 %
Eosinophils Absolute: 0.1 10*3/uL (ref 0.0–0.5)
Eosinophils Relative: 3 %
HCT: 36.2 % (ref 36.0–46.0)
Hemoglobin: 12.2 g/dL (ref 12.0–15.0)
Immature Granulocytes: 3 %
Lymphocytes Relative: 23 %
Lymphs Abs: 1 10*3/uL (ref 0.7–4.0)
MCH: 31.6 pg (ref 26.0–34.0)
MCHC: 33.7 g/dL (ref 30.0–36.0)
MCV: 93.8 fL (ref 80.0–100.0)
Monocytes Absolute: 0.5 10*3/uL (ref 0.1–1.0)
Monocytes Relative: 11 %
Neutro Abs: 2.4 10*3/uL (ref 1.7–7.7)
Neutrophils Relative %: 57 %
Platelets: 295 10*3/uL (ref 150–400)
RBC: 3.86 MIL/uL — ABNORMAL LOW (ref 3.87–5.11)
RDW: 18 % — ABNORMAL HIGH (ref 11.5–15.5)
WBC: 4.2 10*3/uL (ref 4.0–10.5)
nRBC: 0 % (ref 0.0–0.2)

## 2023-07-30 NOTE — Telephone Encounter (Signed)
Dr. Smith Robert would like for patient to restart Verzenio (abemaciclib) at reduced dose of 100mg  bid. LVM for patient to call me back and discuss.

## 2023-07-30 NOTE — Telephone Encounter (Signed)
Patient spoke with Romeo Apple patient advocate and will call the [facility] (Special[facility]) to set up medication delivery. She knows to get started once she has medication in hand.

## 2023-07-31 ENCOUNTER — Other Ambulatory Visit: Payer: Self-pay

## 2023-08-05 ENCOUNTER — Encounter: Payer: Self-pay | Admitting: Plastic Surgery

## 2023-08-05 ENCOUNTER — Ambulatory Visit: Payer: Medicare PPO | Admitting: Plastic Surgery

## 2023-08-05 VITALS — BP 126/78 | HR 102 | Ht 65.0 in | Wt 142.8 lb

## 2023-08-05 DIAGNOSIS — C50819 Malignant neoplasm of overlapping sites of unspecified female breast: Secondary | ICD-10-CM

## 2023-08-05 DIAGNOSIS — C50411 Malignant neoplasm of upper-outer quadrant of right female breast: Secondary | ICD-10-CM

## 2023-08-05 DIAGNOSIS — Z9011 Acquired absence of right breast and nipple: Secondary | ICD-10-CM

## 2023-08-05 DIAGNOSIS — Z170 Estrogen receptor positive status [ER+]: Secondary | ICD-10-CM | POA: Diagnosis not present

## 2023-08-05 DIAGNOSIS — M545 Low back pain, unspecified: Secondary | ICD-10-CM | POA: Diagnosis not present

## 2023-08-05 DIAGNOSIS — C50911 Malignant neoplasm of unspecified site of right female breast: Secondary | ICD-10-CM

## 2023-08-05 NOTE — Progress Notes (Signed)
Patient ID: Megan Vega, female    DOB: April 30, 1956, 67 y.o.   MRN: [id number]   Chief Complaint  Patient presents with   Follow-up   Breast Problem    The patient is a 67 year old female here for a follow-up on her breasts.  She was originally seen in July 2020.  She had undergone bilateral mastectomies in 2019.  She then had radiation which ended in January 202020 on the right breast.  She is 5 feet 5 inches tall.  She was using prosthetic and wanted to have reconstruction.  We went to the OR in October 2020 and did bilateral expander placement.  In March we did a latissimus muscle flap to the right side because she had had that radiation so we were not able to get her expanded on the right.  The right side had a lot of trouble with wound breakdown.  This was likely related to seroma formation and her history of radiation.  We then made the decision to remove the expander in July 2021 from the right breast.  The patient then decided to go ahead and have the left breast expander removed as well in July 2021.  In November 2021 the patient had a little bit of a wound on both breasts and went to the OR for excision and primary closure.  She did well after that.  No additional surgery has been done and she is currently pleased with her results.  There was some concern about mets to the spine and she is being treated for that.  Areas of concern noted today on exam.  She is got good movement of her soft tissue of her chest wall which is great to help keep her range of motion normal in her shoulders.    Review of Systems  Constitutional: Negative.   HENT: Negative.    Eyes: Negative.   Respiratory: Negative.  Negative for chest tightness and shortness of breath.   Cardiovascular: Negative.   Gastrointestinal: Negative.   Endocrine: Negative.   Genitourinary: Negative.   Musculoskeletal: Negative.     Past Medical History:  Diagnosis Date   Ankle fracture    Breast cancer (HCC)     bilateral   Cancer (HCC)    Basal Cell Carcinoma, about 2011 chest   Cataract    Family history of adverse reaction to anesthesia    daughter with PONV   Hemorrhoids    History of methicillin resistant staphylococcus aureus (MRSA) 2015   Migraines    MIGRAINES occ   Personal history of chemotherapy    Personal history of radiation therapy    Wears glasses     Past Surgical History:  Procedure Laterality Date   BREAST BIOPSY Bilateral 10/29/2017   L breast DCIS, R breast invasive mammary carcinoma of no special type, ER/PR+, Her2/neu 1+   BREAST RECONSTRUCTION Right 02/07/2020   Procedure: BREAST RECONSTRUCTION;  Surgeon: Peggye Form, DO;  Location: [facility];  Service: Plastics;  Laterality: Right;   BREAST RECONSTRUCTION WITH PLACEMENT OF TISSUE EXPANDER AND FLEX HD (ACELLULAR HYDRATED DERMIS) Bilateral 08/30/2019   Procedure: BREAST RECONSTRUCTION WITH PLACEMENT OF TISSUE EXPANDER AND FLEX HD (ACELLULAR HYDRATED DERMIS);  Surgeon: Peggye Form, DO;  Location: [facility];  Service: Plastics;  Laterality: Bilateral;  2.5 hours, please   CESAREAN SECTION     COLONOSCOPY  2012   COLONOSCOPY WITH PROPOFOL N/A 12/01/2018   Procedure: COLONOSCOPY WITH PROPOFOL;  Surgeon: Midge Minium, MD;  Location: [facility]  ENDOSCOPY;  Service: Endoscopy;  Laterality: N/A;   DEBRIDEMENT AND CLOSURE WOUND Bilateral 10/02/2020   Procedure: Excision of bilateral breast wound;  Surgeon: Peggye Form, DO;  Location: [facility];  Service: Plastics;  Laterality: Bilateral;  1.5 hours, please   FRACTURE SURGERY     ankle   IR CT BONE TROCAR/NEEDLE BIOPSY DEEP  12/26/2022   IR KYPHO LUMBAR INC FX REDUCE BONE BX UNI/BIL CANNULATION INC/IMAGING  05/14/2023   IR RADIOLOGIST EVAL & MGMT  04/28/2023   IR RADIOLOGIST EVAL & MGMT  06/27/2023   IRRIGATION AND DEBRIDEMENT ABSCESS Right 03/12/2018   Procedure: IRRIGATION AND DEBRIDEMENT RIGHT AXILLARY ABSCESS;  Surgeon: Earline Mayotte, MD;   Location: [facility];  Service: General;  Laterality: Right;   LATISSIMUS FLAP TO BREAST Right 02/07/2020   Procedure: LATISSIMUS MYOCUTANEOUS FLAP TO BREAST;  Surgeon: Peggye Form, DO;  Location: [facility];  Service: Plastics;  Laterality: Right;  3 hours   MANDIBLE FRACTURE SURGERY     MASTECTOMY     MASTECTOMY MODIFIED RADICAL Right 07/10/2018   ypT2 ypN2a ER/PR positive, HER-2/neu negative  Surgeon: Earline Mayotte, MD;  Location: [facility];  Service: General;  Laterality: Right;   PORT-A-CATH REMOVAL Left 02/07/2020   Procedure: REMOVAL PORT-A-CATH;  Surgeon: Peggye Form, DO;  Location: [facility];  Service: Plastics;  Laterality: Left;   PORTACATH PLACEMENT Left 11/28/2017   Procedure: INSERTION PORT-A-CATH;  Surgeon: Earline Mayotte, MD;  Location: [facility];  Service: General;  Laterality: Left;   REMOVAL OF TISSUE EXPANDER Right 06/05/2020   Procedure: TISSUE EXPANDER REMOVAL;  Surgeon: Peggye Form, DO;  Location: [facility];  Service: Plastics;  Laterality: Right;   SIMPLE MASTECTOMY WITH AXILLARY SENTINEL NODE BIOPSY Left 07/10/2018   High-grade DCIS SIMPLE MASTECTOMY;  Surgeon: Earline Mayotte, MD;  Location: [facility];  Service: General;  Laterality: Left;   TISSUE EXPANDER PLACEMENT Left 06/05/2020   Procedure: TISSUE EXPANDER FLUID REMOVAL-100ML;  Surgeon: Peggye Form, DO;  Location: [facility];  Service: Plastics;  Laterality: Left;   TISSUE EXPANDER PLACEMENT Left 06/14/2020   Procedure: TISSUE EXPANDER REMOVAL;  Surgeon: Peggye Form, DO;  Location: [facility];  Service: Plastics;  Laterality: Left;  45 min, please      Current Outpatient Medications:    abemaciclib (VERZENIO) 100 MG tablet, Take 1 tablet (100 mg total) by mouth 2 (two) times daily., Disp: 56 tablet, Rfl: 0   b complex vitamins tablet, Take 1 tablet by mouth daily., Disp: , Rfl:    Calcium-Magnesium-Vitamin D (CALCIUM 1200+D3 PO), Take 1,200 mg by mouth daily. , Disp: , Rfl:     diphenoxylate-atropine (LOMOTIL) 2.5-0.025 MG tablet, Take 2 tablets by mouth 4 (four) times daily as needed for diarrhea or loose stools., Disp: 30 tablet, Rfl: 1   loperamide (IMODIUM) 2 MG capsule, Take 2 tabs by mouth with first loose stool, then 1 tab with each additional loose stool as needed. Do not exceed 8 tabs in a 24-hour period, Disp: 60 capsule, Rfl: 2   loratadine (CLARITIN) 10 MG tablet, Take 10 mg by mouth every morning. , Disp: , Rfl:    Melatonin 5 MG CAPS, Take 5 mg by mouth daily as needed (sleep). , Disp: , Rfl:    Multiple Vitamin (MULTIVITAMIN WITH MINERALS) TABS tablet, Take 1 tablet by mouth daily. Centrum Silver, Disp: , Rfl:    Naphazoline HCl (CLEAR EYES OP), Place 1 drop into both eyes daily., Disp: ,  Rfl:    ondansetron (ZOFRAN) 8 MG tablet, Take 1 tablet (8 mg total) by mouth every 8 (eight) hours as needed for nausea or vomiting., Disp: 15 tablet, Rfl: 0   Probiotic Product (PROBIOTIC PO), Take 1 capsule by mouth daily., Disp: , Rfl:    prochlorperazine (COMPAZINE) 10 MG tablet, Take 1 tablet (10 mg total) by mouth every 6 (six) hours as needed for nausea or vomiting., Disp: 30 tablet, Rfl: 1   traMADol (ULTRAM) 50 MG tablet, Take 1 tablet (50 mg total) by mouth every 6 (six) hours as needed for moderate pain., Disp: 10 tablet, Rfl: 0 No current facility-administered medications for this visit.  Facility-Administered Medications Ordered in Other Visits:    heparin lock flush 100 unit/mL, 500 Units, Intracatheter, PRN, Rosey Bath, MD   Objective:   Vitals:   08/05/23 1511  BP: 126/78  Pulse: (!) 102  SpO2: 95%    Physical Exam Constitutional:      Appearance: Normal appearance.  HENT:     Head: Atraumatic.  Cardiovascular:     Rate and Rhythm: Normal rate.     Pulses: Normal pulses.  Pulmonary:     Effort: Pulmonary effort is normal.  Abdominal:     Palpations: Abdomen is soft.  Musculoskeletal:        General: No swelling or signs of  injury.  Skin:    General: Skin is warm.     Capillary Refill: Capillary refill takes less than 2 seconds.     Coloration: Skin is not jaundiced.     Findings: No bruising.  Neurological:     Mental Status: She is alert and oriented to person, place, and time.  Psychiatric:        Mood and Affect: Mood normal.        Behavior: Behavior normal.        Thought Content: Thought content normal.        Judgment: Judgment normal.     Assessment & Plan:  Primary cancer of upper outer quadrant of right breast (HCC)  Chronic left-sided low back pain without sciatica  Overlapping malignant neoplasm of female breast, unspecified estrogen receptor status, unspecified laterality (HCC)  Bilateral malignant neoplasm of breast in female, estrogen receptor positive, unspecified site of breast (HCC)  Acquired absence of right breast  Patient doing well and we will plan to see her back in 1 year.  She knows to come back sooner if needed.  Pictures were obtained of the patient and placed in the chart with the patient's or guardian's permission.   Alena Bills Samaj Wessells, DO

## 2023-08-06 ENCOUNTER — Encounter: Payer: Self-pay | Admitting: Radiation Oncology

## 2023-08-06 ENCOUNTER — Other Ambulatory Visit: Payer: Self-pay

## 2023-08-06 ENCOUNTER — Inpatient Hospital Stay: Payer: Medicare PPO

## 2023-08-06 ENCOUNTER — Ambulatory Visit
Admission: RE | Admit: 2023-08-06 | Discharge: 2023-08-06 | Disposition: A | Discharge status: Home / Self Care | Payer: Medicare PPO | Source: Ambulatory Visit | Attending: Radiation Oncology | Admitting: Radiation Oncology

## 2023-08-06 VITALS — BP 100/65 | HR 79 | Temp 98.0°F | Resp 15 | Ht 65.0 in | Wt 143.8 lb

## 2023-08-06 DIAGNOSIS — C7951 Secondary malignant neoplasm of bone: Secondary | ICD-10-CM | POA: Insufficient documentation

## 2023-08-06 DIAGNOSIS — C50411 Malignant neoplasm of upper-outer quadrant of right female breast: Secondary | ICD-10-CM

## 2023-08-06 DIAGNOSIS — Z170 Estrogen receptor positive status [ER+]: Secondary | ICD-10-CM | POA: Diagnosis not present

## 2023-08-06 DIAGNOSIS — Z923 Personal history of irradiation: Secondary | ICD-10-CM | POA: Insufficient documentation

## 2023-08-06 DIAGNOSIS — R197 Diarrhea, unspecified: Secondary | ICD-10-CM | POA: Diagnosis not present

## 2023-08-06 DIAGNOSIS — Z79899 Other long term (current) drug therapy: Secondary | ICD-10-CM | POA: Diagnosis not present

## 2023-08-06 DIAGNOSIS — R110 Nausea: Secondary | ICD-10-CM | POA: Diagnosis not present

## 2023-08-06 DIAGNOSIS — Z5111 Encounter for antineoplastic chemotherapy: Secondary | ICD-10-CM | POA: Diagnosis not present

## 2023-08-06 DIAGNOSIS — C773 Secondary and unspecified malignant neoplasm of axilla and upper limb lymph nodes: Secondary | ICD-10-CM | POA: Diagnosis not present

## 2023-08-06 LAB — CBC WITH DIFFERENTIAL/PLATELET
Abs Immature Granulocytes: 0.02 10*3/uL (ref 0.00–0.07)
Basophils Absolute: 0.1 10*3/uL (ref 0.0–0.1)
Basophils Relative: 4 %
Eosinophils Absolute: 0.1 10*3/uL (ref 0.0–0.5)
Eosinophils Relative: 4 %
HCT: 38.2 % (ref 36.0–46.0)
Hemoglobin: 12.6 g/dL (ref 12.0–15.0)
Immature Granulocytes: 1 %
Lymphocytes Relative: 27 %
Lymphs Abs: 0.8 10*3/uL (ref 0.7–4.0)
MCH: 31.3 pg (ref 26.0–34.0)
MCHC: 33 g/dL (ref 30.0–36.0)
MCV: 95 fL (ref 80.0–100.0)
Monocytes Absolute: 0.4 10*3/uL (ref 0.1–1.0)
Monocytes Relative: 14 %
Neutro Abs: 1.5 10*3/uL — ABNORMAL LOW (ref 1.7–7.7)
Neutrophils Relative %: 50 %
Platelets: 309 10*3/uL (ref 150–400)
RBC: 4.02 MIL/uL (ref 3.87–5.11)
RDW: 17.2 % — ABNORMAL HIGH (ref 11.5–15.5)
WBC: 3 10*3/uL — ABNORMAL LOW (ref 4.0–10.5)
nRBC: 0 % (ref 0.0–0.2)

## 2023-08-06 NOTE — Progress Notes (Signed)
Radiation Oncology Follow up Note  Name: Megan Vega   Date:   08/06/2023 MRN:  [id number] DOB: [birth year]    This 67 y.o. female presents to the clinic today for 1 month follow-up status post SBRT to L5 vertebral body patient previously treated for stage IIIb invasive mammary carcinoma the right breast back in 2019.  REFERRING PROVIDER: Allegra Grana, FNP  HPI: Patient is a 67 year old female now out 1 month having completed SBRT to her L5 vertebral body for metastatic involvement of stage IV breast cancer.  Seen today in follow-up she is doing well she states she is in [place] no pain at this time she is not having any trouble ambulating.  No cough hemoptysis or chest tightness.  She is currently under treatment withVerzenio plus Faslodex which she is tolerating well. COMPLICATIONS OF TREATMENT: none  FOLLOW UP COMPLIANCE: keeps appointments   PHYSICAL EXAM:  BP 100/65   Pulse 79   Temp 98 F (36.7 C)   Resp 15   Ht 5\' 5"  (1.651 m)   Wt 143 lb 12.8 oz (65.2 kg)   LMP  (LMP Unknown)   BMI 23.93 kg/m  Well-developed well-nourished patient in NAD. HEENT reveals PERLA, EOMI, discs not visualized.  Oral cavity is clear. No oral mucosal lesions are identified. Neck is clear without evidence of cervical or supraclavicular adenopathy. Lungs are clear to A&P. Cardiac examination is essentially unremarkable with regular rate and rhythm without murmur rub or thrill. Abdomen is benign with no organomegaly or masses noted. Motor sensory and DTR levels are equal and symmetric in the upper and lower extremities. Cranial nerves II through XII are grossly intact. Proprioception is intact. No peripheral adenopathy or edema is identified. No motor or sensory levels are noted. Crude visual fields are within normal range.  RADIOLOGY RESULTS: No current films to review  PLAN: Present time and noted she have a CT scan of her chest back in May.  At this time I am going to turn follow-up care over to  medical oncology.  I be happy to reevaluate the patient anytime should that be indicated.  Patient knows to call with any concerns.  She tolerated treatment extremely well with good result and minimal side effects.  I would like to take this opportunity to thank you for allowing me to participate in the care of your patient.Carmina Miller, MD

## 2023-08-12 ENCOUNTER — Other Ambulatory Visit: Payer: Self-pay

## 2023-08-12 DIAGNOSIS — C50411 Malignant neoplasm of upper-outer quadrant of right female breast: Secondary | ICD-10-CM

## 2023-08-13 ENCOUNTER — Encounter: Payer: Self-pay | Admitting: Oncology

## 2023-08-13 ENCOUNTER — Inpatient Hospital Stay: Payer: Medicare PPO

## 2023-08-13 ENCOUNTER — Inpatient Hospital Stay (HOSPITAL_BASED_OUTPATIENT_CLINIC_OR_DEPARTMENT_OTHER): Payer: Medicare PPO | Admitting: Oncology

## 2023-08-13 VITALS — BP 119/70 | HR 78 | Temp 97.4°F | Resp 18 | Ht 65.0 in | Wt 144.3 lb

## 2023-08-13 VITALS — BP 114/78 | HR 71

## 2023-08-13 DIAGNOSIS — C7951 Secondary malignant neoplasm of bone: Secondary | ICD-10-CM | POA: Diagnosis not present

## 2023-08-13 DIAGNOSIS — Z923 Personal history of irradiation: Secondary | ICD-10-CM | POA: Diagnosis not present

## 2023-08-13 DIAGNOSIS — C50411 Malignant neoplasm of upper-outer quadrant of right female breast: Secondary | ICD-10-CM

## 2023-08-13 DIAGNOSIS — Z79818 Long term (current) use of other agents affecting estrogen receptors and estrogen levels: Secondary | ICD-10-CM | POA: Diagnosis not present

## 2023-08-13 DIAGNOSIS — Z5111 Encounter for antineoplastic chemotherapy: Secondary | ICD-10-CM | POA: Diagnosis not present

## 2023-08-13 DIAGNOSIS — C419 Malignant neoplasm of bone and articular cartilage, unspecified: Secondary | ICD-10-CM | POA: Diagnosis not present

## 2023-08-13 DIAGNOSIS — R110 Nausea: Secondary | ICD-10-CM | POA: Diagnosis not present

## 2023-08-13 DIAGNOSIS — Z79899 Other long term (current) drug therapy: Secondary | ICD-10-CM | POA: Diagnosis not present

## 2023-08-13 DIAGNOSIS — Z7983 Long term (current) use of bisphosphonates: Secondary | ICD-10-CM | POA: Diagnosis not present

## 2023-08-13 DIAGNOSIS — Z5181 Encounter for therapeutic drug level monitoring: Secondary | ICD-10-CM | POA: Diagnosis not present

## 2023-08-13 DIAGNOSIS — T451X5A Adverse effect of antineoplastic and immunosuppressive drugs, initial encounter: Secondary | ICD-10-CM

## 2023-08-13 DIAGNOSIS — C773 Secondary and unspecified malignant neoplasm of axilla and upper limb lymph nodes: Secondary | ICD-10-CM | POA: Diagnosis not present

## 2023-08-13 DIAGNOSIS — C50911 Malignant neoplasm of unspecified site of right female breast: Secondary | ICD-10-CM

## 2023-08-13 DIAGNOSIS — R197 Diarrhea, unspecified: Secondary | ICD-10-CM | POA: Diagnosis not present

## 2023-08-13 DIAGNOSIS — Z170 Estrogen receptor positive status [ER+]: Secondary | ICD-10-CM | POA: Diagnosis not present

## 2023-08-13 LAB — CBC WITH DIFFERENTIAL/PLATELET
Abs Immature Granulocytes: 0.02 10*3/uL (ref 0.00–0.07)
Basophils Absolute: 0.1 10*3/uL (ref 0.0–0.1)
Basophils Relative: 3 %
Eosinophils Absolute: 0.1 10*3/uL (ref 0.0–0.5)
Eosinophils Relative: 5 %
HCT: 36.9 % (ref 36.0–46.0)
Hemoglobin: 12.3 g/dL (ref 12.0–15.0)
Immature Granulocytes: 1 %
Lymphocytes Relative: 31 %
Lymphs Abs: 0.7 10*3/uL (ref 0.7–4.0)
MCH: 32.1 pg (ref 26.0–34.0)
MCHC: 33.3 g/dL (ref 30.0–36.0)
MCV: 96.3 fL (ref 80.0–100.0)
Monocytes Absolute: 0.3 10*3/uL (ref 0.1–1.0)
Monocytes Relative: 12 %
Neutro Abs: 1.1 10*3/uL — ABNORMAL LOW (ref 1.7–7.7)
Neutrophils Relative %: 48 %
Platelets: 271 10*3/uL (ref 150–400)
RBC: 3.83 MIL/uL — ABNORMAL LOW (ref 3.87–5.11)
RDW: 16 % — ABNORMAL HIGH (ref 11.5–15.5)
WBC: 2.4 10*3/uL — ABNORMAL LOW (ref 4.0–10.5)
nRBC: 0 % (ref 0.0–0.2)

## 2023-08-13 LAB — CMP (CANCER CENTER ONLY)
ALT: 20 U/L (ref 0–44)
AST: 26 U/L (ref 15–41)
Albumin: 3.7 g/dL (ref 3.5–5.0)
Alkaline Phosphatase: 52 U/L (ref 38–126)
Anion gap: 5 (ref 5–15)
BUN: 13 mg/dL (ref 8–23)
CO2: 26 mmol/L (ref 22–32)
Calcium: 9 mg/dL (ref 8.9–10.3)
Chloride: 105 mmol/L (ref 98–111)
Creatinine: 1.15 mg/dL — ABNORMAL HIGH (ref 0.44–1.00)
GFR, Estimated: 52 mL/min — ABNORMAL LOW (ref >60)
Glucose, Bld: 77 mg/dL (ref 70–99)
Potassium: 4 mmol/L (ref 3.5–5.1)
Sodium: 136 mmol/L (ref 135–145)
Total Bilirubin: 0.9 mg/dL (ref 0.3–1.2)
Total Protein: 7.1 g/dL (ref 6.5–8.1)

## 2023-08-13 MED ORDER — SODIUM CHLORIDE 0.9 % IV SOLN
Freq: Once | INTRAVENOUS | Status: AC
  Filled 2023-08-13: qty 250

## 2023-08-13 MED ORDER — FULVESTRANT 250 MG/5ML IM SOSY
500.0000 mg | PREFILLED_SYRINGE | Freq: Once | INTRAMUSCULAR | Status: AC
  Administered 2023-08-13: 500 mg via INTRAMUSCULAR
  Filled 2023-08-13: qty 10

## 2023-08-13 MED ORDER — ZOLEDRONIC ACID 4 MG/100ML IV SOLN
4.0000 mg | INTRAVENOUS | Status: DC
  Administered 2023-08-13: 4 mg via INTRAVENOUS
  Filled 2023-08-13: qty 100

## 2023-08-13 NOTE — Progress Notes (Signed)
Hematology/Oncology Consult note [facility]  [phone] [phone]  Patient Care Team: Allegra Grana, FNP as PCP - General (Family Medicine) Jim Like, RN as Oncology Nurse Navigator Byrnett, Merrily Pew, MD (General Surgery) Glori Luis, MD as Consulting Physician (Family Medicine) Lonell Face, MD as Consulting Physician (Neurology) Creig Hines, MD as Consulting Physician (Oncology) Carmina Miller, MD as Consulting Physician (Radiation Oncology)   Name of the patient: Megan Vega  [id number]  1956-02-06   Date of visit: 08/13/23  Diagnosis- history of ER/PR positive HER2 negative breast cancer now with isolated L5 bone metastases stage III right breast cancer and left breast DCIS in 2019     Chief complaint/ Reason for visit-routine follow-up of metastatic breast cancer on Verzenio plus Faslodex  Heme/Onc history: Megan Vega is a 67 y.o. female with clinical stage [id number] right breast cancer and DCIS in the left breast s/p neoadjuvant chemotherapy followed by left simple mastectomy and right modified radical mastectomy on 07/10/2018.  Biopsy showed 1.4 cm grade 2 invasive mammary carcinoma ER greater than 90% positive PR greater than 90% positive and HER2 negative +1   Patient received neoadjuvant AC Taxol chemotherapy until July 2019.   Left breast pathology revealed 2.8 cm residual high grade DCIS, comedo type.  There was atypical lobular hyperplasia.  Four sentinel lymph nodes were negative.  Right breast pathology revealed 3.6 cm residual grade II invasive mammary carcinoma and high grade DCIS.  There was lymphovascular invasion.There was metastatic carcinoma in 2 sentinel lymph nodes.  Right axillary dissection revealed metastatic carcinoma in 7 lymph nodes.  There were a total of 9 lymph nodes with macrometastasis (largest 22 mm).  Pathologic stage was ypT2 ypN2a.   Patient then received adjuvant  radiation treatment and started letrozole in February 2020   She underwent right breast reconstruction with latissimus myocutaneous flap and removal of her port-a-cath on 02/07/2020.  Expanders were taken out in 05/2020 secondary to infection.  She underwent excision of left breast wound with closure on 10/02/2020. Pathology revealed inflamed granulation tissue and fibrosis. There was no evidence of malignancy.  She received adjuvant Zometa for 3 years   Patient found to have L5 vertebral lesion that was hypermetabolic on PET scan.  Biopsies negative for malignancy.  There was no lesions seen elsewhere.  A repeat scans in May 2024 again confirmed the presence of L5 vertebral lesion and patient had a second biopsy with osteo-cool procedure/kyphoplasty.Biopsy was consistent with metastatic adenocarcinoma compatible with breast primary.  ER 100% positive, PR 5% positive and HER2 negative.  Not enough tissue available for NGS testing.  Patient started on Faslodex plus Verzenio in July 2024  Interval history-patient is tolerating lower dose of Verzenio at 100 mg twice daily better and she does not report any significant nausea vomiting or diarrhea.  She has occasional nausea and a few episodes of diarrhea in the whole month.  She is able to eat better.  Denies any fevers  ECOG PS- 1 Pain scale- 0   Review of systems- Review of Systems  Constitutional:  Negative for chills, fever, malaise/fatigue and weight loss.  HENT:  Negative for congestion, ear discharge and nosebleeds.   Eyes:  Negative for blurred vision.  Respiratory:  Negative for cough, hemoptysis, sputum production, shortness of breath and wheezing.   Cardiovascular:  Negative for chest pain, palpitations, orthopnea and claudication.  Gastrointestinal:  Negative for abdominal pain, blood in stool, constipation, diarrhea, heartburn, melena,  nausea and vomiting.  Genitourinary:  Negative for dysuria, flank pain, frequency, hematuria and  urgency.  Musculoskeletal:  Negative for back pain, joint pain and myalgias.  Skin:  Negative for rash.  Neurological:  Negative for dizziness, tingling, focal weakness, seizures, weakness and headaches.  Endo/Heme/Allergies:  Does not bruise/bleed easily.  Psychiatric/Behavioral:  Negative for depression and suicidal ideas. The patient does not have insomnia.       Allergies  Allergen Reactions   Peanut-Containing Drug Products Swelling and Other (See Comments)    Only when eating too many peanuts, swelling with lips and tingly face     Past Medical History:  Diagnosis Date   Ankle fracture    Breast cancer (HCC)    bilateral   Cancer (HCC)    Basal Cell Carcinoma, about 2011 chest   Cataract    Family history of adverse reaction to anesthesia    daughter with PONV   Hemorrhoids    History of methicillin resistant staphylococcus aureus (MRSA) 2015   Migraines    MIGRAINES occ   Personal history of chemotherapy    Personal history of radiation therapy    Wears glasses      Past Surgical History:  Procedure Laterality Date   BREAST BIOPSY Bilateral 10/29/2017   L breast DCIS, R breast invasive mammary carcinoma of no special type, ER/PR+, Her2/neu 1+   BREAST RECONSTRUCTION Right 02/07/2020   Procedure: BREAST RECONSTRUCTION;  Surgeon: Peggye Form, DO;  Location: [facility];  Service: Plastics;  Laterality: Right;   BREAST RECONSTRUCTION WITH PLACEMENT OF TISSUE EXPANDER AND FLEX HD (ACELLULAR HYDRATED DERMIS) Bilateral 08/30/2019   Procedure: BREAST RECONSTRUCTION WITH PLACEMENT OF TISSUE EXPANDER AND FLEX HD (ACELLULAR HYDRATED DERMIS);  Surgeon: Peggye Form, DO;  Location: [facility];  Service: Plastics;  Laterality: Bilateral;  2.5 hours, please   CESAREAN SECTION     COLONOSCOPY  2012   COLONOSCOPY WITH PROPOFOL N/A 12/01/2018   Procedure: COLONOSCOPY WITH PROPOFOL;  Surgeon: Midge Minium, MD;  Location[facility];  Service: Endoscopy;  Laterality:  N/A;   DEBRIDEMENT AND CLOSURE WOUND Bilateral 10/02/2020   Procedure: Excision of bilateral breast wound;  Surgeon: Peggye Form, DO;  Location: [facility];  Service: Plastics;  Laterality: Bilateral;  1.5 hours, please   FRACTURE SURGERY     ankle   IR CT BONE TROCAR/NEEDLE BIOPSY DEEP  12/26/2022   IR KYPHO LUMBAR INC FX REDUCE BONE BX UNI/BIL CANNULATION INC/IMAGING  05/14/2023   IR RADIOLOGIST EVAL & MGMT  04/28/2023   IR RADIOLOGIST EVAL & MGMT  06/27/2023   IRRIGATION AND DEBRIDEMENT ABSCESS Right 03/12/2018   Procedure: IRRIGATION AND DEBRIDEMENT RIGHT AXILLARY ABSCESS;  Surgeon: Earline Mayotte, MD;  Location: [facility];  Service: General;  Laterality: Right;   LATISSIMUS FLAP TO BREAST Right 02/07/2020   Procedure: LATISSIMUS MYOCUTANEOUS FLAP TO BREAST;  Surgeon: Peggye Form, DO;  Location: [facility];  Service: Plastics;  Laterality: Right;  3 hours   MANDIBLE FRACTURE SURGERY     MASTECTOMY     MASTECTOMY MODIFIED RADICAL Right 07/10/2018   ypT2 ypN2a ER/PR positive, HER-2/neu negative  Surgeon: Earline Mayotte, MD;  Location: [facility];  Service: General;  Laterality: Right;   PORT-A-CATH REMOVAL Left 02/07/2020   Procedure: REMOVAL PORT-A-CATH;  Surgeon: Peggye Form, DO;  Location: [facility];  Service: Plastics;  Laterality: Left;   PORTACATH PLACEMENT Left 11/28/2017   Procedure: INSERTION PORT-A-CATH;  Surgeon: Earline Mayotte, MD;  Location: [facility];  Service: General;  Laterality: Left;   REMOVAL OF TISSUE EXPANDER Right 06/05/2020   Procedure: TISSUE EXPANDER REMOVAL;  Surgeon: Peggye Form, DO;  Location: [facility];  Service: Plastics;  Laterality: Right;   SIMPLE MASTECTOMY WITH AXILLARY SENTINEL NODE BIOPSY Left 07/10/2018   High-grade DCIS SIMPLE MASTECTOMY;  Surgeon: Earline Mayotte, MD;  Location: [facility];  Service: General;  Laterality: Left;   TISSUE EXPANDER PLACEMENT Left 06/05/2020   Procedure: TISSUE EXPANDER FLUID  REMOVAL-100ML;  Surgeon: Peggye Form, DO;  Location: [facility];  Service: Plastics;  Laterality: Left;   TISSUE EXPANDER PLACEMENT Left 06/14/2020   Procedure: TISSUE EXPANDER REMOVAL;  Surgeon: Peggye Form, DO;  Location: [facility];  Service: Plastics;  Laterality: Left;  45 min, please    Social History   Socioeconomic History   Marital status: Divorced    Spouse name: Not on file   Number of children: 2   Years of education: Not on file   Highest education level: Not on file  Occupational History   Occupation: Retired  Tobacco Use   Smoking status: Former    Current packs/day: 0.00    Average packs/day: 0.3 packs/day for 10.0 years (2.5 ttl pk-yrs)    Types: Cigarettes    Start date: 68    Quit date: 1978    Years since quitting: 46.7   Smokeless tobacco: Never  Vaping Use   Vaping status: Never Used  Substance and Sexual Activity   Alcohol use: Yes    Alcohol/week: 1.0 standard drink of alcohol    Types: 1 Glasses of wine per week    Comment: " occasional glass of wine "   Drug use: No   Sexual activity: Not Currently    Birth control/protection: Post-menopausal  Other Topics Concern   Not on file  Social History Narrative   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    Lives at home (son 57 yo lives with her)   Children 2   Pets: 3 small dogs and 1 frog    Caffeine- 1 cup of coffee in the morning, rare tea   Social Determinants of Health   Financial Resource Strain: Low Risk  (04/02/2023)   Overall Financial Resource Strain (CARDIA)    Difficulty of Paying Living Expenses: Not hard at all  Food Insecurity: No Food Insecurity (04/02/2023)   Hunger Vital Sign    Worried About Running Out of Food in the Last Year: Never true    Ran Out of Food in the Last Year: Never true  Transportation Needs: No Transportation Needs (04/02/2023)   PRAPARE - Administrator, Civil Service (Medical): No    Lack of Transportation (Non-Medical): No  Physical  Activity: Sufficiently Active (04/02/2023)   Exercise Vital Sign    Days of Exercise per Week: 7 days    Minutes of Exercise per Session: 30 min  Stress: No Stress Concern Present (04/02/2023)   [employer] of Occupational Health - Occupational Stress Questionnaire    Feeling of Stress : Not at all  Social Connections: Moderately Isolated (04/02/2023)   Social Connection and Isolation Panel [NHANES]    Frequency of Communication with Friends and Family: More than three times a week    Frequency of Social Gatherings with Friends and Family: Once a week    Attends Religious Services: More than 4 times per year    Active Member of Clubs or Organizations: No    Attends  Club or Organization Meetings: Never    Marital Status: Divorced  Catering manager Violence: Not At Risk (04/02/2023)   Humiliation, Afraid, Rape, and Kick questionnaire    Fear of Current or Ex-Partner: No    Emotionally Abused: No    Physically Abused: No    Sexually Abused: No    Family History  Problem Relation Age of Onset   Cancer Mother        Esophageal Cancer   Early death Mother        Age 35   Cancer Father        Lung Cancer   Diabetes Father    Diabetes Brother        Controlled with diet   Heart attack Paternal Grandfather    Colon cancer Neg Hx      Current Outpatient Medications:    abemaciclib (VERZENIO) 100 MG tablet, Take 1 tablet (100 mg total) by mouth 2 (two) times daily., Disp: 56 tablet, Rfl: 0   b complex vitamins tablet, Take 1 tablet by mouth daily., Disp: , Rfl:    Calcium-Magnesium-Vitamin D (CALCIUM 1200+D3 PO), Take 1,200 mg by mouth daily. , Disp: , Rfl:    loperamide (IMODIUM) 2 MG capsule, Take 2 tabs by mouth with first loose stool, then 1 tab with each additional loose stool as needed. Do not exceed 8 tabs in a 24-hour period, Disp: 60 capsule, Rfl: 2   loratadine (CLARITIN) 10 MG tablet, Take 10 mg by mouth every morning. , Disp: , Rfl:    Multiple Vitamin (MULTIVITAMIN  WITH MINERALS) TABS tablet, Take 1 tablet by mouth daily. Centrum Silver, Disp: , Rfl:    Naphazoline HCl (CLEAR EYES OP), Place 1 drop into both eyes daily., Disp: , Rfl:    ondansetron (ZOFRAN) 8 MG tablet, Take 1 tablet (8 mg total) by mouth every 8 (eight) hours as needed for nausea or vomiting., Disp: 15 tablet, Rfl: 0   Probiotic Product (PROBIOTIC PO), Take 1 capsule by mouth daily., Disp: , Rfl:    prochlorperazine (COMPAZINE) 10 MG tablet, Take 1 tablet (10 mg total) by mouth every 6 (six) hours as needed for nausea or vomiting., Disp: 30 tablet, Rfl: 1   traMADol (ULTRAM) 50 MG tablet, Take 1 tablet (50 mg total) by mouth every 6 (six) hours as needed for moderate pain., Disp: 10 tablet, Rfl: 0   diphenoxylate-atropine (LOMOTIL) 2.5-0.025 MG tablet, Take 2 tablets by mouth 4 (four) times daily as needed for diarrhea or loose stools. (Patient not taking: Reported on 08/13/2023), Disp: 30 tablet, Rfl: 1   Melatonin 5 MG CAPS, Take 5 mg by mouth daily as needed (sleep).  (Patient not taking: Reported on 08/13/2023), Disp: , Rfl:  No current facility-administered medications for this visit.  Facility-Administered Medications Ordered in Other Visits:    heparin lock flush 100 unit/mL, 500 Units, Intracatheter, PRN, Merlene Pulling, Melissa C, MD   Zoledronic Acid (ZOMETA) IVPB 4 mg, 4 mg, Intravenous, Q90 days, Creig Hines, MD, Stopped at 08/13/23 1127  Physical exam:  Vitals:   08/13/23 0935  BP: 119/70  Pulse: 78  Resp: 18  Temp: (!) 97.4 F (36.3 C)  TempSrc: Tympanic  SpO2: 100%  Weight: 144 lb 4.8 oz (65.5 kg)  Height: 5\' 5"  (1.651 m)   Physical Exam Cardiovascular:     Rate and Rhythm: Normal rate and regular rhythm.     Heart sounds: Normal heart sounds.  Pulmonary:     Effort: Pulmonary effort is  normal.     Breath sounds: Normal breath sounds.  Skin:    General: Skin is warm and dry.  Neurological:     Mental Status: She is alert and oriented to person, place, and time.          Latest Ref Rng & Units 08/13/2023    9:13 AM  CMP  Glucose 70 - 99 mg/dL 77   BUN 8 - 23 mg/dL 13   Creatinine 9.14 - 1.00 mg/dL 7.82   Sodium 956 - 213 mmol/L 136   Potassium 3.5 - 5.1 mmol/L 4.0   Chloride 98 - 111 mmol/L 105   CO2 22 - 32 mmol/L 26   Calcium 8.9 - 10.3 mg/dL 9.0   Total Protein 6.5 - 8.1 g/dL 7.1   Total Bilirubin 0.3 - 1.2 mg/dL 0.9   Alkaline Phos 38 - 126 U/L 52   AST 15 - 41 U/L 26   ALT 0 - 44 U/L 20       Latest Ref Rng & Units 08/13/2023    9:13 AM  CBC  WBC 4.0 - 10.5 K/uL 2.4   Hemoglobin 12.0 - 15.0 g/dL 08.6   Hematocrit 57.8 - 46.0 % 36.9   Platelets 150 - 400 K/uL 271      Assessment and plan- Patient is a 67 y.o. female with history of metastatic ER +100% PR +5% HER2 negative breast cancer with isolated L5 metastases here for routine follow-up  Patient will receive her Faslodex today and will continue with monthly Faslodex injections.  She is presently on reduced dose of Verzenio 100 mg twice daily.  White count is 2.4 today with an ANC of 1.1.  I will continue to monitor her counts weekly.  If her counts stabilize and her ANC remains more than 1 she can remain on Verzenio 100 mg twice daily.  However if she drops her counts I will consider switching her to Ibrance 100 mg daily 3 weeks on and 1 week off as it has a lower risk of neutropenia.  I will be doing NGS test either with repeat biopsy or on her peripheral blood upon progression.  For her L5 bone metastases I plan to give her Zometa every 3 months starting today.  I will see her back in 1 month with labs and PET scan prior   Visit Diagnosis 1. Primary cancer of upper outer quadrant of right breast (HCC)   2. High risk medication use   3. Malignant neoplasm of bone with metastases (HCC)   4. Use of fulvestrant (Faslodex)   5. Encounter for monitoring zoledronic acid therapy      Dr. Owens Shark, MD, MPH [facility] at [facility] [phone] 08/13/2023 12:10 PM

## 2023-08-13 NOTE — Patient Instructions (Signed)

## 2023-08-19 ENCOUNTER — Other Ambulatory Visit: Payer: Self-pay

## 2023-08-19 ENCOUNTER — Other Ambulatory Visit: Payer: Self-pay | Admitting: Oncology

## 2023-08-19 ENCOUNTER — Inpatient Hospital Stay: Payer: Medicare PPO | Attending: Oncology

## 2023-08-19 ENCOUNTER — Other Ambulatory Visit (HOSPITAL_COMMUNITY): Payer: Self-pay

## 2023-08-19 DIAGNOSIS — C50411 Malignant neoplasm of upper-outer quadrant of right female breast: Secondary | ICD-10-CM | POA: Diagnosis not present

## 2023-08-19 DIAGNOSIS — D702 Other drug-induced agranulocytosis: Secondary | ICD-10-CM | POA: Insufficient documentation

## 2023-08-19 DIAGNOSIS — Z170 Estrogen receptor positive status [ER+]: Secondary | ICD-10-CM | POA: Diagnosis not present

## 2023-08-19 DIAGNOSIS — Z801 Family history of malignant neoplasm of trachea, bronchus and lung: Secondary | ICD-10-CM | POA: Insufficient documentation

## 2023-08-19 DIAGNOSIS — Z9013 Acquired absence of bilateral breasts and nipples: Secondary | ICD-10-CM | POA: Insufficient documentation

## 2023-08-19 DIAGNOSIS — Z5111 Encounter for antineoplastic chemotherapy: Secondary | ICD-10-CM | POA: Diagnosis not present

## 2023-08-19 DIAGNOSIS — Z79899 Other long term (current) drug therapy: Secondary | ICD-10-CM

## 2023-08-19 DIAGNOSIS — Z800 Family history of malignant neoplasm of digestive organs: Secondary | ICD-10-CM | POA: Insufficient documentation

## 2023-08-19 DIAGNOSIS — C7951 Secondary malignant neoplasm of bone: Secondary | ICD-10-CM | POA: Diagnosis not present

## 2023-08-19 DIAGNOSIS — Z87891 Personal history of nicotine dependence: Secondary | ICD-10-CM | POA: Insufficient documentation

## 2023-08-19 DIAGNOSIS — Z1732 Human epidermal growth factor receptor 2 negative status: Secondary | ICD-10-CM | POA: Insufficient documentation

## 2023-08-19 DIAGNOSIS — Z1721 Progesterone receptor positive status: Secondary | ICD-10-CM | POA: Diagnosis not present

## 2023-08-19 LAB — CBC WITH DIFFERENTIAL/PLATELET
Abs Immature Granulocytes: 0.01 10*3/uL (ref 0.00–0.07)
Basophils Absolute: 0.1 10*3/uL (ref 0.0–0.1)
Basophils Relative: 3 %
Eosinophils Absolute: 0.2 10*3/uL (ref 0.0–0.5)
Eosinophils Relative: 6 %
HCT: 35.9 % — ABNORMAL LOW (ref 36.0–46.0)
Hemoglobin: 12.1 g/dL (ref 12.0–15.0)
Immature Granulocytes: 0 %
Lymphocytes Relative: 28 %
Lymphs Abs: 0.7 10*3/uL (ref 0.7–4.0)
MCH: 33 pg (ref 26.0–34.0)
MCHC: 33.7 g/dL (ref 30.0–36.0)
MCV: 97.8 fL (ref 80.0–100.0)
Monocytes Absolute: 0.3 10*3/uL (ref 0.1–1.0)
Monocytes Relative: 10 %
Neutro Abs: 1.4 10*3/uL — ABNORMAL LOW (ref 1.7–7.7)
Neutrophils Relative %: 53 %
Platelets: 189 10*3/uL (ref 150–400)
RBC: 3.67 MIL/uL — ABNORMAL LOW (ref 3.87–5.11)
RDW: 15.3 % (ref 11.5–15.5)
WBC: 2.6 10*3/uL — ABNORMAL LOW (ref 4.0–10.5)
nRBC: 0 % (ref 0.0–0.2)

## 2023-08-19 LAB — COMPREHENSIVE METABOLIC PANEL
ALT: 19 U/L (ref 0–44)
AST: 27 U/L (ref 15–41)
Albumin: 3.8 g/dL (ref 3.5–5.0)
Alkaline Phosphatase: 65 U/L (ref 38–126)
Anion gap: 7 (ref 5–15)
BUN: 11 mg/dL (ref 8–23)
CO2: 26 mmol/L (ref 22–32)
Calcium: 8.6 mg/dL — ABNORMAL LOW (ref 8.9–10.3)
Chloride: 105 mmol/L (ref 98–111)
Creatinine, Ser: 1.17 mg/dL — ABNORMAL HIGH (ref 0.44–1.00)
GFR, Estimated: 51 mL/min — ABNORMAL LOW (ref >60)
Glucose, Bld: 83 mg/dL (ref 70–99)
Potassium: 4.2 mmol/L (ref 3.5–5.1)
Sodium: 138 mmol/L (ref 135–145)
Total Bilirubin: 0.5 mg/dL (ref 0.3–1.2)
Total Protein: 7.1 g/dL (ref 6.5–8.1)

## 2023-08-19 NOTE — Progress Notes (Signed)
Specialty Pharmacy Refill Coordination Note  Megan Vega is a 67 y.o. female contacted today regarding refills of specialty medication(s) Abemaciclib   Patient requested Delivery   Delivery date: 08/26/23   Verified address: [address]   [place] [address]   Medication will be filled on 08/25/23 pending Rx refill request.

## 2023-08-20 ENCOUNTER — Other Ambulatory Visit: Payer: Self-pay

## 2023-08-21 ENCOUNTER — Encounter: Payer: Self-pay | Admitting: Oncology

## 2023-08-21 ENCOUNTER — Encounter: Payer: Self-pay | Admitting: Hematology and Oncology

## 2023-08-21 ENCOUNTER — Other Ambulatory Visit: Payer: Self-pay

## 2023-08-21 MED ORDER — ABEMACICLIB 100 MG PO TABS
100.0000 mg | ORAL_TABLET | Freq: Two times a day (BID) | ORAL | 0 refills | Status: DC
  Filled 2023-08-21: qty 56, 28d supply, fill #0

## 2023-08-21 NOTE — Telephone Encounter (Signed)
CBC with Differential/Platelet Order: [id number] Status: Final result     Visible to patient: Yes (seen)     Next appt: 08/26/2023 at 10:15 AM in Oncology ([facility])     Dx: High risk medication use; Primary can...   0 Result Notes          Component Ref Range & Units 2 d ago (08/19/23) 8 d ago (08/13/23) 2 wk ago (08/06/23) 3 wk ago (07/30/23) 4 wk ago (07/23/23) 1 mo ago (07/16/23) 1 mo ago (07/01/23)  WBC 4.0 - 10.5 K/uL 2.6 Low  2.4 Low  3.0 Low  4.2 1.3 Low  0.7 Low Panic  CM 1.6 Low   RBC 3.87 - 5.11 MIL/uL 3.67 Low  3.83 Low  4.02 3.86 Low  3.41 Low  3.54 Low  3.95  Hemoglobin 12.0 - 15.0 g/dL 86.5 78.4 69.6 29.5 28.4 Low  10.9 Low  12.1  HCT 36.0 - 46.0 % 35.9 Low  36.9 38.2 36.2 31.8 Low  32.0 Low  35.3 Low   MCV 80.0 - 100.0 fL 97.8 96.3 95.0 93.8 93.3 90.4 89.4  MCH 26.0 - 34.0 pg 33.0 32.1 31.3 31.6 31.4 30.8 30.6  MCHC 30.0 - 36.0 g/dL 13.2 44.0 10.2 72.5 36.6 34.1 34.3  RDW 11.5 - 15.5 % 15.3 16.0 High  17.2 High  18.0 High  17.3 High  16.2 High  12.9  Platelets 150 - 400 K/uL 189 271 309 295 185 218 135 Low   nRBC 0.0 - 0.2 % 0.0 0.0 0.0 0.0 0.0 0.0 0.0  Neutrophils Relative % % 53 48 50 57 14 7 48  Neutro Abs 1.7 - 7.7 K/uL 1.4 Low  1.1 Low  1.5 Low  2.4 0.2 Low Panic  CM 0.1 Low Panic  CM 0.8 Low   Lymphocytes Relative % 28 31 27 23  42 53 37  Lymphs Abs 0.7 - 4.0 K/uL 0.7 0.7 0.8 1.0 0.6 Low  0.4 Low  0.6 Low   Monocytes Relative % 10 12 14 11 29 20 11   Monocytes Absolute 0.1 - 1.0 K/uL 0.3 0.3 0.4 0.5 0.4 0.1 0.2  Eosinophils Relative % 6 5 4 3 8 11 3   Eosinophils Absolute 0.0 - 0.5 K/uL 0.2 0.1 0.1 0.1 0.1 0.1 0.0  Basophils Relative % 3 3 4 3 5 9 1   Basophils Absolute 0.0 - 0.1 K/uL 0.1 0.1 0.1 0.1 0.1 0.1 0.0  Immature Granulocytes % 0 1 1 3 2  0 0  Abs Immature Granulocytes 0.00 - 0.07 K/uL 0.01 0.02 CM 0.02 CM 0.11 High  CM 0.02 CM 0.00 CM 0.00 CM  Comment: Performed at [facility], [address] [place], [place] [address]   Resulting Agency [facility]         Specimen Collected: 08/19/23 10:10 Last Resulted: 08/19/23 10:28      Lab Flowsheet      Order Details      View Encounter      Lab and Collection Details      Routing      Result History    View All Conversations on this Encounter      CM=Additional comments      Result Care Coordination   Patient Communication   Add Comments   Seen Back to Top    Other Results from 08/19/2023   Contains abnormal data Comprehensive metabolic panel  Order: [id number] Status: Final result      Visible to patient: Yes (seen)      Next appt: 08/26/2023 at 10:15 AM in Oncology ([facility])      Dx: High risk medication use; Primary can...    0 Result Notes            Component Ref Range & Units 2 d ago (08/19/23) 8 d ago (08/13/23) 4 wk ago (07/23/23) 1 mo ago (07/16/23) 1 mo ago (07/01/23) 2 mo ago (06/18/23) 2 mo ago (06/02/23)  Sodium 135 - 145 mmol/L 138 136 135 136 132 Low  132 Low  137  Potassium 3.5 - 5.1 mmol/L 4.2 4.0 4.3 3.9 3.6 3.4 Low  4.7  Chloride 98 - 111 mmol/L 105 105 104 105 100 99 102  CO2 22 - 32 mmol/L 26 26 25 25 23 25 26   Glucose, Bld 70 - 99 mg/dL 83 77 CM 409 High  CM 811 High  CM 114 High  CM 109 High  CM 94 CM  Comment: Glucose reference range applies only to samples taken after fasting for at least 8 hours.  BUN 8 - 23 mg/dL 11 13 12 11 11 8 11   Creatinine, Ser 0.44 - 1.00 mg/dL 9.14 High  7.82 High  9.56 High  1.12 High  1.33 High  1.41 High  1.01 High   Calcium 8.9 - 10.3 mg/dL 8.6 Low  9.0 8.8 Low  9.0 8.8 Low  8.8 Low  9.5  Total Protein 6.5 - 8.1 g/dL 7.1 7.1 6.7 6.7 7.0 7.2 8.0  Albumin 3.5 - 5.0 g/dL 3.8 3.7 3.8 3.8 3.9 3.9 4.6  AST 15 - 41 U/L 27 26 38 56 High  27 21 23   ALT 0 - 44 U/L 19 20 39 53 High  20 22 15   Alkaline Phosphatase 38 - 126 U/L 65 52 70 71 56 65 67  Total Bilirubin 0.3 - 1.2 mg/dL 0.5 0.9 0.6 0.3 0.9  1.0 0.7  GFR, Estimated >60 mL/min 51 Low   57 Low  CM 54 Low  CM 44 Low  CM 41 Low  CM >60 CM  Comment: (NOTE) Calculated using the CKD-EPI Creatinine Equation (2021)  Anion gap 5 - 15 7 5  CM 6 CM 6 CM 9 CM 8 CM 9 CM  Comment: Performed at [facility], [address] [place], [place] [address]  Resulting Agency [facility]         Specimen Collected: 08/19/23 10:10 Last Resulted: 08/19/23 10:32

## 2023-08-25 ENCOUNTER — Other Ambulatory Visit: Payer: Self-pay

## 2023-08-25 ENCOUNTER — Other Ambulatory Visit (HOSPITAL_COMMUNITY): Payer: Self-pay

## 2023-08-26 ENCOUNTER — Ambulatory Visit: Payer: Medicare PPO

## 2023-08-26 ENCOUNTER — Inpatient Hospital Stay: Payer: Medicare PPO

## 2023-08-26 ENCOUNTER — Other Ambulatory Visit: Payer: Medicare PPO

## 2023-08-26 ENCOUNTER — Ambulatory Visit: Payer: Medicare PPO | Admitting: Oncology

## 2023-08-26 DIAGNOSIS — C7951 Secondary malignant neoplasm of bone: Secondary | ICD-10-CM | POA: Diagnosis not present

## 2023-08-26 DIAGNOSIS — Z1721 Progesterone receptor positive status: Secondary | ICD-10-CM | POA: Diagnosis not present

## 2023-08-26 DIAGNOSIS — Z9013 Acquired absence of bilateral breasts and nipples: Secondary | ICD-10-CM | POA: Diagnosis not present

## 2023-08-26 DIAGNOSIS — Z79899 Other long term (current) drug therapy: Secondary | ICD-10-CM

## 2023-08-26 DIAGNOSIS — Z170 Estrogen receptor positive status [ER+]: Secondary | ICD-10-CM | POA: Diagnosis not present

## 2023-08-26 DIAGNOSIS — Z5111 Encounter for antineoplastic chemotherapy: Secondary | ICD-10-CM | POA: Diagnosis not present

## 2023-08-26 DIAGNOSIS — Z87891 Personal history of nicotine dependence: Secondary | ICD-10-CM | POA: Diagnosis not present

## 2023-08-26 DIAGNOSIS — Z1732 Human epidermal growth factor receptor 2 negative status: Secondary | ICD-10-CM | POA: Diagnosis not present

## 2023-08-26 DIAGNOSIS — C50411 Malignant neoplasm of upper-outer quadrant of right female breast: Secondary | ICD-10-CM

## 2023-08-26 DIAGNOSIS — D702 Other drug-induced agranulocytosis: Secondary | ICD-10-CM | POA: Diagnosis not present

## 2023-08-26 LAB — CBC WITH DIFFERENTIAL/PLATELET
Abs Immature Granulocytes: 0.01 10*3/uL (ref 0.00–0.07)
Basophils Absolute: 0.1 10*3/uL (ref 0.0–0.1)
Basophils Relative: 3 %
Eosinophils Absolute: 0.1 10*3/uL (ref 0.0–0.5)
Eosinophils Relative: 5 %
HCT: 39.5 % (ref 36.0–46.0)
Hemoglobin: 13.1 g/dL (ref 12.0–15.0)
Immature Granulocytes: 0 %
Lymphocytes Relative: 30 %
Lymphs Abs: 0.7 10*3/uL (ref 0.7–4.0)
MCH: 32.7 pg (ref 26.0–34.0)
MCHC: 33.2 g/dL (ref 30.0–36.0)
MCV: 98.5 fL (ref 80.0–100.0)
Monocytes Absolute: 0.2 10*3/uL (ref 0.1–1.0)
Monocytes Relative: 7 %
Neutro Abs: 1.4 10*3/uL — ABNORMAL LOW (ref 1.7–7.7)
Neutrophils Relative %: 55 %
Platelets: 158 10*3/uL (ref 150–400)
RBC: 4.01 MIL/uL (ref 3.87–5.11)
RDW: 14.5 % (ref 11.5–15.5)
WBC: 2.5 10*3/uL — ABNORMAL LOW (ref 4.0–10.5)
nRBC: 0 % (ref 0.0–0.2)

## 2023-08-26 LAB — COMPREHENSIVE METABOLIC PANEL
ALT: 20 U/L (ref 0–44)
AST: 28 U/L (ref 15–41)
Albumin: 4.2 g/dL (ref 3.5–5.0)
Alkaline Phosphatase: 65 U/L (ref 38–126)
Anion gap: 8 (ref 5–15)
BUN: 13 mg/dL (ref 8–23)
CO2: 24 mmol/L (ref 22–32)
Calcium: 8.8 mg/dL — ABNORMAL LOW (ref 8.9–10.3)
Chloride: 104 mmol/L (ref 98–111)
Creatinine, Ser: 1.25 mg/dL — ABNORMAL HIGH (ref 0.44–1.00)
GFR, Estimated: 47 mL/min — ABNORMAL LOW (ref >60)
Glucose, Bld: 110 mg/dL — ABNORMAL HIGH (ref 70–99)
Potassium: 3.9 mmol/L (ref 3.5–5.1)
Sodium: 136 mmol/L (ref 135–145)
Total Bilirubin: 0.9 mg/dL (ref 0.3–1.2)
Total Protein: 7.5 g/dL (ref 6.5–8.1)

## 2023-09-02 ENCOUNTER — Inpatient Hospital Stay: Payer: Medicare PPO

## 2023-09-02 DIAGNOSIS — Z1732 Human epidermal growth factor receptor 2 negative status: Secondary | ICD-10-CM | POA: Diagnosis not present

## 2023-09-02 DIAGNOSIS — Z5111 Encounter for antineoplastic chemotherapy: Secondary | ICD-10-CM | POA: Diagnosis not present

## 2023-09-02 DIAGNOSIS — D702 Other drug-induced agranulocytosis: Secondary | ICD-10-CM | POA: Diagnosis not present

## 2023-09-02 DIAGNOSIS — Z170 Estrogen receptor positive status [ER+]: Secondary | ICD-10-CM | POA: Diagnosis not present

## 2023-09-02 DIAGNOSIS — Z79899 Other long term (current) drug therapy: Secondary | ICD-10-CM

## 2023-09-02 DIAGNOSIS — Z1721 Progesterone receptor positive status: Secondary | ICD-10-CM | POA: Diagnosis not present

## 2023-09-02 DIAGNOSIS — Z9013 Acquired absence of bilateral breasts and nipples: Secondary | ICD-10-CM | POA: Diagnosis not present

## 2023-09-02 DIAGNOSIS — C50411 Malignant neoplasm of upper-outer quadrant of right female breast: Secondary | ICD-10-CM | POA: Diagnosis not present

## 2023-09-02 DIAGNOSIS — Z87891 Personal history of nicotine dependence: Secondary | ICD-10-CM | POA: Diagnosis not present

## 2023-09-02 DIAGNOSIS — C7951 Secondary malignant neoplasm of bone: Secondary | ICD-10-CM | POA: Diagnosis not present

## 2023-09-02 LAB — CBC WITH DIFFERENTIAL/PLATELET
Abs Immature Granulocytes: 0.01 10*3/uL (ref 0.00–0.07)
Basophils Absolute: 0.1 10*3/uL (ref 0.0–0.1)
Basophils Relative: 2 %
Eosinophils Absolute: 0.1 10*3/uL (ref 0.0–0.5)
Eosinophils Relative: 3 %
HCT: 36.1 % (ref 36.0–46.0)
Hemoglobin: 12.2 g/dL (ref 12.0–15.0)
Immature Granulocytes: 0 %
Lymphocytes Relative: 28 %
Lymphs Abs: 0.8 10*3/uL (ref 0.7–4.0)
MCH: 33.3 pg (ref 26.0–34.0)
MCHC: 33.8 g/dL (ref 30.0–36.0)
MCV: 98.6 fL (ref 80.0–100.0)
Monocytes Absolute: 0.3 10*3/uL (ref 0.1–1.0)
Monocytes Relative: 9 %
Neutro Abs: 1.5 10*3/uL — ABNORMAL LOW (ref 1.7–7.7)
Neutrophils Relative %: 58 %
Platelets: 177 10*3/uL (ref 150–400)
RBC: 3.66 MIL/uL — ABNORMAL LOW (ref 3.87–5.11)
RDW: 14.2 % (ref 11.5–15.5)
WBC: 2.7 10*3/uL — ABNORMAL LOW (ref 4.0–10.5)
nRBC: 0 % (ref 0.0–0.2)

## 2023-09-03 ENCOUNTER — Ambulatory Visit
Admission: RE | Admit: 2023-09-03 | Discharge: 2023-09-03 | Disposition: A | Discharge status: Home / Self Care | Payer: Medicare PPO | Source: Ambulatory Visit | Attending: Oncology | Admitting: Oncology

## 2023-09-03 DIAGNOSIS — Z79899 Other long term (current) drug therapy: Secondary | ICD-10-CM | POA: Insufficient documentation

## 2023-09-03 DIAGNOSIS — C7951 Secondary malignant neoplasm of bone: Secondary | ICD-10-CM | POA: Insufficient documentation

## 2023-09-03 DIAGNOSIS — C419 Malignant neoplasm of bone and articular cartilage, unspecified: Secondary | ICD-10-CM | POA: Diagnosis not present

## 2023-09-03 DIAGNOSIS — C50411 Malignant neoplasm of upper-outer quadrant of right female breast: Secondary | ICD-10-CM | POA: Insufficient documentation

## 2023-09-03 DIAGNOSIS — Z9013 Acquired absence of bilateral breasts and nipples: Secondary | ICD-10-CM | POA: Insufficient documentation

## 2023-09-03 LAB — GLUCOSE, CAPILLARY: Glucose-Capillary: 88 mg/dL (ref 70–99)

## 2023-09-03 MED ORDER — FLUDEOXYGLUCOSE F - 18 (FDG) INJECTION
7.5000 | Freq: Once | INTRAVENOUS | Status: AC | PRN
  Administered 2023-09-03: 8.16 via INTRAVENOUS

## 2023-09-09 ENCOUNTER — Encounter: Payer: Self-pay | Admitting: Oncology

## 2023-09-09 ENCOUNTER — Inpatient Hospital Stay: Payer: Medicare PPO

## 2023-09-09 ENCOUNTER — Inpatient Hospital Stay: Payer: Medicare PPO | Admitting: Oncology

## 2023-09-09 VITALS — BP 111/78 | HR 96 | Temp 96.9°F | Wt 145.5 lb

## 2023-09-09 DIAGNOSIS — C50411 Malignant neoplasm of upper-outer quadrant of right female breast: Secondary | ICD-10-CM

## 2023-09-09 DIAGNOSIS — Z79899 Other long term (current) drug therapy: Secondary | ICD-10-CM

## 2023-09-09 DIAGNOSIS — Z170 Estrogen receptor positive status [ER+]: Secondary | ICD-10-CM | POA: Diagnosis not present

## 2023-09-09 DIAGNOSIS — Z87891 Personal history of nicotine dependence: Secondary | ICD-10-CM | POA: Diagnosis not present

## 2023-09-09 DIAGNOSIS — Z1732 Human epidermal growth factor receptor 2 negative status: Secondary | ICD-10-CM | POA: Diagnosis not present

## 2023-09-09 DIAGNOSIS — D702 Other drug-induced agranulocytosis: Secondary | ICD-10-CM

## 2023-09-09 DIAGNOSIS — Z79818 Long term (current) use of other agents affecting estrogen receptors and estrogen levels: Secondary | ICD-10-CM | POA: Diagnosis not present

## 2023-09-09 DIAGNOSIS — Z1721 Progesterone receptor positive status: Secondary | ICD-10-CM | POA: Diagnosis not present

## 2023-09-09 DIAGNOSIS — Z9013 Acquired absence of bilateral breasts and nipples: Secondary | ICD-10-CM | POA: Diagnosis not present

## 2023-09-09 DIAGNOSIS — C7951 Secondary malignant neoplasm of bone: Secondary | ICD-10-CM | POA: Diagnosis not present

## 2023-09-09 DIAGNOSIS — Z5111 Encounter for antineoplastic chemotherapy: Secondary | ICD-10-CM | POA: Diagnosis not present

## 2023-09-09 LAB — CBC WITH DIFFERENTIAL/PLATELET
Abs Immature Granulocytes: 0.01 10*3/uL (ref 0.00–0.07)
Basophils Absolute: 0.1 10*3/uL (ref 0.0–0.1)
Basophils Relative: 4 %
Eosinophils Absolute: 0.1 10*3/uL (ref 0.0–0.5)
Eosinophils Relative: 4 %
HCT: 37.8 % (ref 36.0–46.0)
Hemoglobin: 12.9 g/dL (ref 12.0–15.0)
Immature Granulocytes: 0 %
Lymphocytes Relative: 33 %
Lymphs Abs: 0.9 10*3/uL (ref 0.7–4.0)
MCH: 33.4 pg (ref 26.0–34.0)
MCHC: 34.1 g/dL (ref 30.0–36.0)
MCV: 97.9 fL (ref 80.0–100.0)
Monocytes Absolute: 0.3 10*3/uL (ref 0.1–1.0)
Monocytes Relative: 13 %
Neutro Abs: 1.2 10*3/uL — ABNORMAL LOW (ref 1.7–7.7)
Neutrophils Relative %: 46 %
Platelets: 212 10*3/uL (ref 150–400)
RBC: 3.86 MIL/uL — ABNORMAL LOW (ref 3.87–5.11)
RDW: 13.5 % (ref 11.5–15.5)
WBC: 2.7 10*3/uL — ABNORMAL LOW (ref 4.0–10.5)
nRBC: 0 % (ref 0.0–0.2)

## 2023-09-09 MED ORDER — FULVESTRANT 250 MG/5ML IM SOSY
500.0000 mg | PREFILLED_SYRINGE | Freq: Once | INTRAMUSCULAR | Status: AC
  Administered 2023-09-09: 500 mg via INTRAMUSCULAR
  Filled 2023-09-09: qty 10

## 2023-09-09 NOTE — Progress Notes (Signed)
Hematology/Oncology Consult note [facility]  [phone] [phone]  Patient Care Team: Allegra Grana, FNP as PCP - General (Family Medicine) Jim Like, RN as Oncology Nurse Navigator Byrnett, Merrily Pew, MD (General Surgery) Glori Luis, MD as Consulting Physician (Family Medicine) Lonell Face, MD as Consulting Physician (Neurology) Creig Hines, MD as Consulting Physician (Oncology) Carmina Miller, MD as Consulting Physician (Radiation Oncology)   Name of the patient: Megan Vega  [id number]  08/21/1956   Date of visit: 09/09/23  Diagnosis- history of ER/PR positive HER2 negative breast cancer now with isolated L5 bone metastases stage III right breast cancer and left breast DCIS in 2019     Chief complaint/ Reason for visit-routine follow-up of metastatic breast cancer with isolated L5 metastases on Verzenio and Faslodex  Heme/Onc history: Megan Vega is a 67 y.o. female with clinical stage T2N3bM0 right breast cancer and DCIS in the left breast s/p neoadjuvant chemotherapy followed by left simple mastectomy and right modified radical mastectomy on 07/10/2018.  Biopsy showed 1.4 cm grade 2 invasive mammary carcinoma ER greater than 90% positive PR greater than 90% positive and HER2 negative +1   Patient received neoadjuvant AC Taxol chemotherapy until July 2019.   Left breast pathology revealed 2.8 cm residual high grade DCIS, comedo type.  There was atypical lobular hyperplasia.  Four sentinel lymph nodes were negative.  Right breast pathology revealed 3.6 cm residual grade II invasive mammary carcinoma and high grade DCIS.  There was lymphovascular invasion.There was metastatic carcinoma in 2 sentinel lymph nodes.  Right axillary dissection revealed metastatic carcinoma in 7 lymph nodes.  There were a total of 9 lymph nodes with macrometastasis (largest 22 mm).  Pathologic stage was ypT2 ypN2a.    Patient then received adjuvant radiation treatment and started letrozole in February 2020   Megan Vega underwent right breast reconstruction with latissimus myocutaneous flap and removal of her port-a-cath on 02/07/2020.  Expanders were taken out in 05/2020 secondary to infection.  Megan Vega underwent excision of left breast wound with closure on 10/02/2020. Pathology revealed inflamed granulation tissue and fibrosis. There was no evidence of malignancy.  Megan Vega received adjuvant Zometa for 3 years   Patient found to have L5 vertebral lesion that was hypermetabolic on PET scan.  Biopsies negative for malignancy.  There was no lesions seen elsewhere.  A repeat scans in May 2024 again confirmed the presence of L5 vertebral lesion and patient had a second biopsy with osteo-cool procedure/kyphoplasty.Biopsy was consistent with metastatic adenocarcinoma compatible with breast primary.  ER 100% positive, PR 5% positive and HER2 negative.  Not enough tissue available for NGS testing.  Patient started on Faslodex plus Verzenio in July 2024  Interval history-tolerating lower dose of Verzenio at 100 mg twice daily well without any significant GI side effects.  Quality of life has been good.  Back pain is still mild but tolerable  ECOG PS- 1 Pain scale- 2 Opioid associated constipation- no  Review of systems- Review of Systems  Constitutional:  Negative for chills, fever, malaise/fatigue and weight loss.  HENT:  Negative for congestion, ear discharge and nosebleeds.   Eyes:  Negative for blurred vision.  Respiratory:  Negative for cough, hemoptysis, sputum production, shortness of breath and wheezing.   Cardiovascular:  Negative for chest pain, palpitations, orthopnea and claudication.  Gastrointestinal:  Negative for abdominal pain, blood in stool, constipation, diarrhea, heartburn, melena, nausea and vomiting.  Genitourinary:  Negative for dysuria, flank pain,  frequency, hematuria and urgency.  Musculoskeletal:   Positive for back pain. Negative for joint pain and myalgias.  Skin:  Negative for rash.  Neurological:  Negative for dizziness, tingling, focal weakness, seizures, weakness and headaches.  Endo/Heme/Allergies:  Does not bruise/bleed easily.  Psychiatric/Behavioral:  Negative for depression and suicidal ideas. The patient does not have insomnia.       Allergies  Allergen Reactions   Peanut-Containing Drug Products Swelling and Other (See Comments)    Only when eating too many peanuts, swelling with lips and tingly face     Past Medical History:  Diagnosis Date   Ankle fracture    Breast cancer (HCC)    bilateral   Cancer (HCC)    Basal Cell Carcinoma, about 2011 chest   Cataract    Family history of adverse reaction to anesthesia    daughter with PONV   Hemorrhoids    History of methicillin resistant staphylococcus aureus (MRSA) 2015   Migraines    MIGRAINES occ   Personal history of chemotherapy    Personal history of radiation therapy    Wears glasses      Past Surgical History:  Procedure Laterality Date   BREAST BIOPSY Bilateral 10/29/2017   L breast DCIS, R breast invasive mammary carcinoma of no special type, ER/PR+, Her2/neu 1+   BREAST RECONSTRUCTION Right 02/07/2020   Procedure: BREAST RECONSTRUCTION;  Surgeon: Peggye Form, DO;  Location: [facility];  Service: Plastics;  Laterality: Right;   BREAST RECONSTRUCTION WITH PLACEMENT OF TISSUE EXPANDER AND FLEX HD (ACELLULAR HYDRATED DERMIS) Bilateral 08/30/2019   Procedure: BREAST RECONSTRUCTION WITH PLACEMENT OF TISSUE EXPANDER AND FLEX HD (ACELLULAR HYDRATED DERMIS);  Surgeon: Peggye Form, DO;  Location: [facility] ORS;  Service: Plastics;  Laterality: Bilateral;  2.5 hours, please   CESAREAN SECTION     COLONOSCOPY  2012   COLONOSCOPY WITH PROPOFOL N/A 12/01/2018   Procedure: COLONOSCOPY WITH PROPOFOL;  Surgeon: Midge Minium, MD;  Location: [facility] ENDOSCOPY;  Service: Endoscopy;  Laterality: N/A;   DEBRIDEMENT  AND CLOSURE WOUND Bilateral 10/02/2020   Procedure: Excision of bilateral breast wound;  Surgeon: Peggye Form, DO;  Location: [facility];  Service: Plastics;  Laterality: Bilateral;  1.5 hours, please   FRACTURE SURGERY     ankle   IR CT BONE TROCAR/NEEDLE BIOPSY DEEP  12/26/2022   IR KYPHO LUMBAR INC FX REDUCE BONE BX UNI/BIL CANNULATION INC/IMAGING  05/14/2023   IR RADIOLOGIST EVAL & MGMT  04/28/2023   IR RADIOLOGIST EVAL & MGMT  06/27/2023   IRRIGATION AND DEBRIDEMENT ABSCESS Right 03/12/2018   Procedure: IRRIGATION AND DEBRIDEMENT RIGHT AXILLARY ABSCESS;  Surgeon: Earline Mayotte, MD;  Location: [facility];  Service: General;  Laterality: Right;   LATISSIMUS FLAP TO BREAST Right 02/07/2020   Procedure: LATISSIMUS MYOCUTANEOUS FLAP TO BREAST;  Surgeon: Peggye Form, DO;  Location: [facility];  Service: Plastics;  Laterality: Right;  3 hours   MANDIBLE FRACTURE SURGERY     MASTECTOMY     MASTECTOMY MODIFIED RADICAL Right 07/10/2018   ypT2 ypN2a ER/PR positive, HER-2/neu negative  Surgeon: Earline Mayotte, MD;  Location: [facility];  Service: General;  Laterality: Right;   PORT-A-CATH REMOVAL Left 02/07/2020   Procedure: REMOVAL PORT-A-CATH;  Surgeon: Peggye Form, DO;  Location: [facility];  Service: Plastics;  Laterality: Left;   PORTACATH PLACEMENT Left 11/28/2017   Procedure: INSERTION PORT-A-CATH;  Surgeon: Earline Mayotte, MD;  Location: [facility];  Service: General;  Laterality:  Left;   REMOVAL OF TISSUE EXPANDER Right 06/05/2020   Procedure: TISSUE EXPANDER REMOVAL;  Surgeon: Peggye Form, DO;  Location: [facility];  Service: Plastics;  Laterality: Right;   SIMPLE MASTECTOMY WITH AXILLARY SENTINEL NODE BIOPSY Left 07/10/2018   High-grade DCIS SIMPLE MASTECTOMY;  Surgeon: Earline Mayotte, MD;  Location: [facility];  Service: General;  Laterality: Left;   TISSUE EXPANDER PLACEMENT Left 06/05/2020   Procedure: TISSUE EXPANDER FLUID REMOVAL-100ML;  Surgeon:  Peggye Form, DO;  Location: [facility];  Service: Plastics;  Laterality: Left;   TISSUE EXPANDER PLACEMENT Left 06/14/2020   Procedure: TISSUE EXPANDER REMOVAL;  Surgeon: Peggye Form, DO;  Location: [facility];  Service: Plastics;  Laterality: Left;  45 min, please    Social History   Socioeconomic History   Marital status: Divorced    Spouse name: Not on file   Number of children: 2   Years of education: Not on file   Highest education level: Not on file  Occupational History   Occupation: Retired  Tobacco Use   Smoking status: Former    Current packs/day: 0.00    Average packs/day: 0.3 packs/day for 10.0 years (2.5 ttl pk-yrs)    Types: Cigarettes    Start date: 57    Quit date: 1978    Years since quitting: 46.8   Smokeless tobacco: Never  Vaping Use   Vaping status: Never Used  Substance and Sexual Activity   Alcohol use: Yes    Alcohol/week: 1.0 standard drink of alcohol    Types: 1 Glasses of wine per week    Comment: " occasional glass of wine "   Drug use: No   Sexual activity: Not Currently    Birth control/protection: Post-menopausal  Other Topics Concern   Not on file  Social History Narrative   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    Lives at home (son 39 yo lives with her)   Children 2   Pets: 3 small dogs and 1 frog    Caffeine- 1 cup of coffee in the morning, rare tea   Social Determinants of Health   Financial Resource Strain: Low Risk  (04/02/2023)   Overall Financial Resource Strain (CARDIA)    Difficulty of Paying Living Expenses: Not hard at all  Food Insecurity: No Food Insecurity (04/02/2023)   Hunger Vital Sign    Worried About Running Out of Food in the Last Year: Never true    Ran Out of Food in the Last Year: Never true  Transportation Needs: No Transportation Needs (04/02/2023)   PRAPARE - Administrator, Civil Service (Medical): No    Lack of Transportation (Non-Medical): No  Physical Activity: Sufficiently Active  (04/02/2023)   Exercise Vital Sign    Days of Exercise per Week: 7 days    Minutes of Exercise per Session: 30 min  Stress: No Stress Concern Present (04/02/2023)   [employer] of Occupational Health - Occupational Stress Questionnaire    Feeling of Stress : Not at all  Social Connections: Moderately Isolated (04/02/2023)   Social Connection and Isolation Panel [NHANES]    Frequency of Communication with Friends and Family: More than three times a week    Frequency of Social Gatherings with Friends and Family: Once a week    Attends Religious Services: More than 4 times per year    Active Member of [employer] or Organizations: No    Attends Banker Meetings: Never  Marital Status: Divorced  Catering manager Violence: Not At Risk (04/02/2023)   Humiliation, Afraid, Rape, and Kick questionnaire    Fear of Current or Ex-Partner: No    Emotionally Abused: No    Physically Abused: No    Sexually Abused: No    Family History  Problem Relation Age of Onset   Cancer Mother        Esophageal Cancer   Early death Mother        Age 24   Cancer Father        Lung Cancer   Diabetes Father    Diabetes Brother        Controlled with diet   Heart attack Paternal Grandfather    Colon cancer Neg Hx      Current Outpatient Medications:    abemaciclib (VERZENIO) 100 MG tablet, Take 1 tablet (100 mg total) by mouth 2 (two) times daily., Disp: 56 tablet, Rfl: 0   b complex vitamins tablet, Take 1 tablet by mouth daily., Disp: , Rfl:    Calcium-Magnesium-Vitamin D (CALCIUM 1200+D3 PO), Take 1,200 mg by mouth daily. , Disp: , Rfl:    diphenoxylate-atropine (LOMOTIL) 2.5-0.025 MG tablet, Take 2 tablets by mouth 4 (four) times daily as needed for diarrhea or loose stools., Disp: 30 tablet, Rfl: 1   loperamide (IMODIUM) 2 MG capsule, Take 2 tabs by mouth with first loose stool, then 1 tab with each additional loose stool as needed. Do not exceed 8 tabs in a 24-hour period, Disp: 60  capsule, Rfl: 2   loratadine (CLARITIN) 10 MG tablet, Take 10 mg by mouth every morning. , Disp: , Rfl:    Multiple Vitamin (MULTIVITAMIN WITH MINERALS) TABS tablet, Take 1 tablet by mouth daily. Centrum Silver, Disp: , Rfl:    Naphazoline HCl (CLEAR EYES OP), Place 1 drop into both eyes daily., Disp: , Rfl:    ondansetron (ZOFRAN) 8 MG tablet, Take 1 tablet (8 mg total) by mouth every 8 (eight) hours as needed for nausea or vomiting., Disp: 15 tablet, Rfl: 0   Probiotic Product (PROBIOTIC PO), Take 1 capsule by mouth daily., Disp: , Rfl:    prochlorperazine (COMPAZINE) 10 MG tablet, Take 1 tablet (10 mg total) by mouth every 6 (six) hours as needed for nausea or vomiting., Disp: 30 tablet, Rfl: 1   traMADol (ULTRAM) 50 MG tablet, Take 1 tablet (50 mg total) by mouth every 6 (six) hours as needed for moderate pain., Disp: 10 tablet, Rfl: 0   Melatonin 5 MG CAPS, Take 5 mg by mouth daily as needed (sleep).  (Patient not taking: Reported on 08/13/2023), Disp: , Rfl:  No current facility-administered medications for this visit.  Facility-Administered Medications Ordered in Other Visits:    fulvestrant (FASLODEX) injection 500 mg, 500 mg, Intramuscular, Once, Creig Hines, MD   heparin lock flush 100 unit/mL, 500 Units, Intracatheter, PRN, Rosey Bath, MD  Physical exam:  Vitals:   09/09/23 1032  BP: 111/78  Pulse: 96  Temp: (!) 96.9 F (36.1 C)  TempSrc: Tympanic  Weight: 145 lb 8 oz (66 kg)   Physical Exam HENT:     Mouth/Throat:     Mouth: Mucous membranes are moist.     Pharynx: Oropharynx is clear.  Cardiovascular:     Rate and Rhythm: Normal rate.  Pulmonary:     Effort: Pulmonary effort is normal.  Musculoskeletal:     Cervical back: Normal range of motion.  Skin:    General: Skin  is warm and dry.  Neurological:     Mental Status: Megan Vega is alert and oriented to person, place, and time.         Latest Ref Rng & Units 08/26/2023   10:07 AM  CMP  Glucose 70 - 99  mg/dL 161   BUN 8 - 23 mg/dL 13   Creatinine 0.96 - 1.00 mg/dL 0.45   Sodium 409 - 811 mmol/L 136   Potassium 3.5 - 5.1 mmol/L 3.9   Chloride 98 - 111 mmol/L 104   CO2 22 - 32 mmol/L 24   Calcium 8.9 - 10.3 mg/dL 8.8   Total Protein 6.5 - 8.1 g/dL 7.5   Total Bilirubin 0.3 - 1.2 mg/dL 0.9   Alkaline Phos 38 - 126 U/L 65   AST 15 - 41 U/L 28   ALT 0 - 44 U/L 20       Latest Ref Rng & Units 09/09/2023   10:07 AM  CBC  WBC 4.0 - 10.5 K/uL 2.7   Hemoglobin 12.0 - 15.0 g/dL 91.4   Hematocrit 78.2 - 46.0 % 37.8   Platelets 150 - 400 K/uL 212     Assessment and plan- Patient is a 67 y.o. female with history of metastatic ER positive breast cancer with isolated L5 metastases currently on Faslodex and Verzenio here for routine follow-up  Patient has leukopenia/neutropenia even on lower dose of Verzenio 100 mg twice daily but neutrophil counts remain more than 1.  Therefore okay to proceed with same dose of Verzenio until progression or toxicity.  Megan Vega will also receive her next dose of Faslodex today.  Continue with monthly Faslodex injections.  If neutrophils drop to less than 1 on this dose of Verzenio I will switch her to [employer] today and in 1 month and 2 months.  See Dr. Smith Robert in 2 months.  Get Zometa in 2 months.  Needs CBC with differential, CMP, CA 15-3 and CA 27-29 at that time.  CBC with differential in 2 weeks and 4 weeks, cmp in 2 weeks   Visit Diagnosis 1. Primary cancer of upper outer quadrant of right breast (HCC)   2. High risk medication use   3. Use of fulvestrant (Faslodex)   4. Drug-induced neutropenia (HCC)      Dr. Owens Shark, MD, MPH [facility] [phone] 09/09/2023 11:15 AM

## 2023-09-12 ENCOUNTER — Other Ambulatory Visit: Payer: Self-pay

## 2023-09-12 ENCOUNTER — Other Ambulatory Visit: Payer: Self-pay | Admitting: Oncology

## 2023-09-12 DIAGNOSIS — C50411 Malignant neoplasm of upper-outer quadrant of right female breast: Secondary | ICD-10-CM

## 2023-09-12 NOTE — Progress Notes (Signed)
Specialty Pharmacy Refill Coordination Note  Megan Vega is a 67 y.o. female contacted today regarding refills of specialty medication(s) Abemaciclib   Patient requested Delivery   Delivery date: 09/19/23   Verified address: [address]   [place] [address]   Medication will be filled on 09/18/23 pending a refill request.

## 2023-09-15 ENCOUNTER — Other Ambulatory Visit: Payer: Self-pay

## 2023-09-15 MED ORDER — ABEMACICLIB 100 MG PO TABS
100.0000 mg | ORAL_TABLET | Freq: Two times a day (BID) | ORAL | 0 refills | Status: DC
  Filled 2023-09-15: qty 56, 28d supply, fill #0

## 2023-09-15 NOTE — Progress Notes (Signed)
Encounter opened in error

## 2023-09-15 NOTE — Telephone Encounter (Signed)
CBC with Differential/Platelet Order: [id number] Status: Final result     Visible to patient: Yes (seen)     Next appt: 09/23/2023 at 10:45 AM in Oncology ([facility])     Dx: High risk medication use; Primary can...   0 Result Notes          Component Ref Range & Units 6 d ago (09/09/23) 13 d ago (09/02/23) 2 wk ago (08/26/23) 3 wk ago (08/19/23) 1 mo ago (08/13/23) 1 mo ago (08/06/23) 1 mo ago (07/30/23)  WBC 4.0 - 10.5 K/uL 2.7 Low  2.7 Low  2.5 Low  2.6 Low  2.4 Low  3.0 Low  4.2  RBC 3.87 - 5.11 MIL/uL 3.86 Low  3.66 Low  4.01 3.67 Low  3.83 Low  4.02 3.86 Low   Hemoglobin 12.0 - 15.0 g/dL 78.2 95.6 21.3 08.6 57.8 12.6 12.2  HCT 36.0 - 46.0 % 37.8 36.1 39.5 35.9 Low  36.9 38.2 36.2  MCV 80.0 - 100.0 fL 97.9 98.6 98.5 97.8 96.3 95.0 93.8  MCH 26.0 - 34.0 pg 33.4 33.3 32.7 33.0 32.1 31.3 31.6  MCHC 30.0 - 36.0 g/dL 46.9 62.9 52.8 41.3 24.4 33.0 33.7  RDW 11.5 - 15.5 % 13.5 14.2 14.5 15.3 16.0 High  17.2 High  18.0 High   Platelets 150 - 400 K/uL 212 177 158 189 271 309 295  nRBC 0.0 - 0.2 % 0.0 0.0 0.0 0.0 0.0 0.0 0.0  Neutrophils Relative % % 46 58 55 53 48 50 57  Neutro Abs 1.7 - 7.7 K/uL 1.2 Low  1.5 Low  1.4 Low  1.4 Low  1.1 Low  1.5 Low  2.4  Lymphocytes Relative % 33 28 30 28 31 27 23   Lymphs Abs 0.7 - 4.0 K/uL 0.9 0.8 0.7 0.7 0.7 0.8 1.0  Monocytes Relative % 13 9 7 10 12 14 11   Monocytes Absolute 0.1 - 1.0 K/uL 0.3 0.3 0.2 0.3 0.3 0.4 0.5  Eosinophils Relative % 4 3 5 6 5 4 3   Eosinophils Absolute 0.0 - 0.5 K/uL 0.1 0.1 0.1 0.2 0.1 0.1 0.1  Basophils Relative % 4 2 3 3 3 4 3   Basophils Absolute 0.0 - 0.1 K/uL 0.1 0.1 0.1 0.1 0.1 0.1 0.1  Immature Granulocytes % 0 0 0 0 1 1 3   Abs Immature Granulocytes 0.00 - 0.07 K/uL 0.01 0.01 CM 0.01 CM 0.01 CM 0.02 CM 0.02 CM 0.11 High  CM  Comment: Performed at [facility], [place], [address] [place], [place] [address]  Resulting Agency [facility]IN LAB [facility] CLIN LAB [facility] CLIN LAB CH CLIN LAB CH CLIN LAB CH CLIN  LAB CH CLIN LAB         Specimen Collected: 09/09/23 10:07 Last Resulted: 09/09/23 10:38      Lab Flowsheet      Order Details      View Encounter      Lab and Collection Details      Routing      Result History    View All Conversations on this Encounter      CM=Additional comments      Result Care Coordination   Patient Communication   Add Comments   Seen Back to Top     Result Read / Acknowledged  Acknowledge result User Time Read / Oretha Milch, MD 09/09/2023 10:38 AM

## 2023-09-18 ENCOUNTER — Other Ambulatory Visit: Payer: Self-pay | Admitting: Medical Genetics

## 2023-09-18 DIAGNOSIS — Z006 Encounter for examination for normal comparison and control in clinical research program: Secondary | ICD-10-CM

## 2023-09-23 ENCOUNTER — Inpatient Hospital Stay: Payer: Medicare PPO | Attending: Oncology

## 2023-09-23 DIAGNOSIS — Z5111 Encounter for antineoplastic chemotherapy: Secondary | ICD-10-CM | POA: Diagnosis not present

## 2023-09-23 DIAGNOSIS — C50411 Malignant neoplasm of upper-outer quadrant of right female breast: Secondary | ICD-10-CM | POA: Diagnosis not present

## 2023-09-23 DIAGNOSIS — Z170 Estrogen receptor positive status [ER+]: Secondary | ICD-10-CM | POA: Diagnosis not present

## 2023-09-23 DIAGNOSIS — C7951 Secondary malignant neoplasm of bone: Secondary | ICD-10-CM | POA: Diagnosis not present

## 2023-09-23 LAB — COMPREHENSIVE METABOLIC PANEL
ALT: 26 U/L (ref 0–44)
AST: 36 U/L (ref 15–41)
Albumin: 4.2 g/dL (ref 3.5–5.0)
Alkaline Phosphatase: 63 U/L (ref 38–126)
Anion gap: 7 (ref 5–15)
BUN: 11 mg/dL (ref 8–23)
CO2: 25 mmol/L (ref 22–32)
Calcium: 8.8 mg/dL — ABNORMAL LOW (ref 8.9–10.3)
Chloride: 103 mmol/L (ref 98–111)
Creatinine, Ser: 1.16 mg/dL — ABNORMAL HIGH (ref 0.44–1.00)
GFR, Estimated: 52 mL/min — ABNORMAL LOW (ref >60)
Glucose, Bld: 96 mg/dL (ref 70–99)
Potassium: 4.1 mmol/L (ref 3.5–5.1)
Sodium: 135 mmol/L (ref 135–145)
Total Bilirubin: 0.6 mg/dL (ref <1.2)
Total Protein: 7.5 g/dL (ref 6.5–8.1)

## 2023-09-23 LAB — CBC WITH DIFFERENTIAL/PLATELET
Abs Immature Granulocytes: 0.01 10*3/uL (ref 0.00–0.07)
Basophils Absolute: 0.1 10*3/uL (ref 0.0–0.1)
Basophils Relative: 4 %
Eosinophils Absolute: 0.1 10*3/uL (ref 0.0–0.5)
Eosinophils Relative: 3 %
HCT: 37.8 % (ref 36.0–46.0)
Hemoglobin: 13.2 g/dL (ref 12.0–15.0)
Immature Granulocytes: 0 %
Lymphocytes Relative: 27 %
Lymphs Abs: 0.8 10*3/uL (ref 0.7–4.0)
MCH: 34 pg (ref 26.0–34.0)
MCHC: 34.9 g/dL (ref 30.0–36.0)
MCV: 97.4 fL (ref 80.0–100.0)
Monocytes Absolute: 0.3 10*3/uL (ref 0.1–1.0)
Monocytes Relative: 9 %
Neutro Abs: 1.8 10*3/uL (ref 1.7–7.7)
Neutrophils Relative %: 57 %
Platelets: 199 10*3/uL (ref 150–400)
RBC: 3.88 MIL/uL (ref 3.87–5.11)
RDW: 12.7 % (ref 11.5–15.5)
WBC: 3.1 10*3/uL — ABNORMAL LOW (ref 4.0–10.5)
nRBC: 0 % (ref 0.0–0.2)

## 2023-09-24 LAB — CANCER ANTIGEN 15-3: CA 15-3: 14.9 U/mL (ref 0.0–25.0)

## 2023-09-24 LAB — CANCER ANTIGEN 27.29: CA 27.29: 11.7 U/mL (ref 0.0–38.6)

## 2023-09-25 ENCOUNTER — Other Ambulatory Visit
Admission: RE | Admit: 2023-09-25 | Discharge: 2023-09-25 | Disposition: A | Discharge status: Home / Self Care | Payer: Medicare PPO | Source: Ambulatory Visit | Attending: Medical Genetics | Admitting: Medical Genetics

## 2023-09-25 DIAGNOSIS — Z006 Encounter for examination for normal comparison and control in clinical research program: Secondary | ICD-10-CM | POA: Insufficient documentation

## 2023-10-07 ENCOUNTER — Inpatient Hospital Stay: Payer: Medicare PPO

## 2023-10-07 DIAGNOSIS — C50411 Malignant neoplasm of upper-outer quadrant of right female breast: Secondary | ICD-10-CM

## 2023-10-07 DIAGNOSIS — Z170 Estrogen receptor positive status [ER+]: Secondary | ICD-10-CM | POA: Diagnosis not present

## 2023-10-07 DIAGNOSIS — Z5111 Encounter for antineoplastic chemotherapy: Secondary | ICD-10-CM | POA: Diagnosis not present

## 2023-10-07 DIAGNOSIS — C7951 Secondary malignant neoplasm of bone: Secondary | ICD-10-CM | POA: Diagnosis not present

## 2023-10-07 LAB — COMPREHENSIVE METABOLIC PANEL
ALT: 25 U/L (ref 0–44)
AST: 33 U/L (ref 15–41)
Albumin: 3.9 g/dL (ref 3.5–5.0)
Alkaline Phosphatase: 54 U/L (ref 38–126)
Anion gap: 9 (ref 5–15)
BUN: 21 mg/dL (ref 8–23)
CO2: 25 mmol/L (ref 22–32)
Calcium: 9 mg/dL (ref 8.9–10.3)
Chloride: 105 mmol/L (ref 98–111)
Creatinine, Ser: 1.13 mg/dL — ABNORMAL HIGH (ref 0.44–1.00)
GFR, Estimated: 53 mL/min — ABNORMAL LOW (ref >60)
Glucose, Bld: 96 mg/dL (ref 70–99)
Potassium: 4.5 mmol/L (ref 3.5–5.1)
Sodium: 139 mmol/L (ref 135–145)
Total Bilirubin: 0.7 mg/dL (ref <1.2)
Total Protein: 7.1 g/dL (ref 6.5–8.1)

## 2023-10-07 LAB — GENECONNECT MOLECULAR SCREEN

## 2023-10-07 LAB — CBC WITH DIFFERENTIAL/PLATELET
Abs Immature Granulocytes: 0.01 10*3/uL (ref 0.00–0.07)
Basophils Absolute: 0.1 10*3/uL (ref 0.0–0.1)
Basophils Relative: 3 %
Eosinophils Absolute: 0.1 10*3/uL (ref 0.0–0.5)
Eosinophils Relative: 3 %
HCT: 37.6 % (ref 36.0–46.0)
Hemoglobin: 13.1 g/dL (ref 12.0–15.0)
Immature Granulocytes: 0 %
Lymphocytes Relative: 26 %
Lymphs Abs: 0.7 10*3/uL (ref 0.7–4.0)
MCH: 34.3 pg — ABNORMAL HIGH (ref 26.0–34.0)
MCHC: 34.8 g/dL (ref 30.0–36.0)
MCV: 98.4 fL (ref 80.0–100.0)
Monocytes Absolute: 0.3 10*3/uL (ref 0.1–1.0)
Monocytes Relative: 10 %
Neutro Abs: 1.6 10*3/uL — ABNORMAL LOW (ref 1.7–7.7)
Neutrophils Relative %: 58 %
Platelets: 177 10*3/uL (ref 150–400)
RBC: 3.82 MIL/uL — ABNORMAL LOW (ref 3.87–5.11)
RDW: 12.6 % (ref 11.5–15.5)
WBC: 2.7 10*3/uL — ABNORMAL LOW (ref 4.0–10.5)
nRBC: 0 % (ref 0.0–0.2)

## 2023-10-07 LAB — HELIX MOLECULAR SCREEN: Genetic Analysis Overall Interpretation: NEGATIVE

## 2023-10-07 MED ORDER — FULVESTRANT 250 MG/5ML IM SOSY
500.0000 mg | PREFILLED_SYRINGE | Freq: Once | INTRAMUSCULAR | Status: AC
  Administered 2023-10-07: 500 mg via INTRAMUSCULAR
  Filled 2023-10-07: qty 10

## 2023-10-08 ENCOUNTER — Other Ambulatory Visit: Payer: Self-pay | Admitting: Oncology

## 2023-10-08 ENCOUNTER — Other Ambulatory Visit: Payer: Self-pay

## 2023-10-08 DIAGNOSIS — C50411 Malignant neoplasm of upper-outer quadrant of right female breast: Secondary | ICD-10-CM

## 2023-10-08 MED ORDER — ABEMACICLIB 100 MG PO TABS
100.0000 mg | ORAL_TABLET | Freq: Two times a day (BID) | ORAL | 0 refills | Status: DC
  Filled 2023-10-08: qty 56, 28d supply, fill #0

## 2023-10-08 NOTE — Telephone Encounter (Signed)
CBC with Differential/Platelet Order: [id number] Status: Final result     Visible to patient: Yes (seen)     Next appt: 11/03/2023 at 09:45 AM in Oncology ([facility])     Dx: Primary cancer of upper outer quadran...   0 Result Notes          Component Ref Range & Units 1 d ago (10/07/23) 2 wk ago (09/23/23) 4 wk ago (09/09/23) 1 mo ago (09/02/23) 1 mo ago (08/26/23) 1 mo ago (08/19/23) 1 mo ago (08/13/23)  WBC 4.0 - 10.5 K/uL 2.7 Low  3.1 Low  2.7 Low  2.7 Low  2.5 Low  2.6 Low  2.4 Low   RBC 3.87 - 5.11 MIL/uL 3.82 Low  3.88 3.86 Low  3.66 Low  4.01 3.67 Low  3.83 Low   Hemoglobin 12.0 - 15.0 g/dL 40.9 81.1 91.4 78.2 95.6 12.1 12.3  HCT 36.0 - 46.0 % 37.6 37.8 37.8 36.1 39.5 35.9 Low  36.9  MCV 80.0 - 100.0 fL 98.4 97.4 97.9 98.6 98.5 97.8 96.3  MCH 26.0 - 34.0 pg 34.3 High  34.0 33.4 33.3 32.7 33.0 32.1  MCHC 30.0 - 36.0 g/dL 21.3 08.6 57.8 46.9 62.9 33.7 33.3  RDW 11.5 - 15.5 % 12.6 12.7 13.5 14.2 14.5 15.3 16.0 High   Platelets 150 - 400 K/uL 177 199 212 177 158 189 271  nRBC 0.0 - 0.2 % 0.0 0.0 0.0 0.0 0.0 0.0 0.0  Neutrophils Relative % % 58 57 46 58 55 53 48  Neutro Abs 1.7 - 7.7 K/uL 1.6 Low  1.8 1.2 Low  1.5 Low  1.4 Low  1.4 Low  1.1 Low   Lymphocytes Relative % 26 27 33 28 30 28 31   Lymphs Abs 0.7 - 4.0 K/uL 0.7 0.8 0.9 0.8 0.7 0.7 0.7  Monocytes Relative % 10 9 13 9 7 10 12   Monocytes Absolute 0.1 - 1.0 K/uL 0.3 0.3 0.3 0.3 0.2 0.3 0.3  Eosinophils Relative % 3 3 4 3 5 6 5   Eosinophils Absolute 0.0 - 0.5 K/uL 0.1 0.1 0.1 0.1 0.1 0.2 0.1  Basophils Relative % 3 4 4 2 3 3 3   Basophils Absolute 0.0 - 0.1 K/uL 0.1 0.1 0.1 0.1 0.1 0.1 0.1  Immature Granulocytes % 0 0 0 0 0 0 1  Abs Immature Granulocytes 0.00 - 0.07 K/uL 0.01 0.01 CM 0.01 CM 0.01 CM 0.01 CM 0.01 CM 0.02 CM  Comment: Performed at [facility], [address] [place], [place] [address]  Resulting Agency [facility] CLIN LAB CH CLIN LAB CH CLIN LAB CH CLIN LAB CH CLIN LAB CH CLIN LAB CH  CLIN LAB         Specimen Collected: 10/07/23 10:38 Last Resulted: 10/07/23 10:53      Lab Flowsheet      Order Details      View Encounter      Lab and Collection Details      Routing      Result History    View All Conversations on this Encounter      CM=Additional comments      Result Care Coordination   Patient Communication   Add Comments   Seen Back to Top    Other Results from 10/07/2023   Contains abnormal data Comprehensive metabolic panel Order: [id number] Status: Final result      Visible to patient: Yes (seen)      Next appt: 11/03/2023 at 09:45 AM in Oncology ([facility])  Dx: Primary cancer of upper outer quadran...    0 Result Notes            Component Ref Range & Units 1 d ago (10/07/23) 2 wk ago (09/23/23) 1 mo ago (08/26/23) 1 mo ago (08/19/23) 1 mo ago (08/13/23) 2 mo ago (07/23/23) 2 mo ago (07/16/23)  Sodium 135 - 145 mmol/L 139 135 136 138 136 135 136  Potassium 3.5 - 5.1 mmol/L 4.5 4.1 3.9 4.2 4.0 4.3 3.9  Chloride 98 - 111 mmol/L 105 103 104 105 105 104 105  CO2 22 - 32 mmol/L 25 25 24 26 26 25 25   Glucose, Bld 70 - 99 mg/dL 96 96 CM 409 High  CM 83 CM 77 CM 129 High  CM 110 High  CM  Comment: Glucose reference range applies only to samples taken after fasting for at least 8 hours.  BUN 8 - 23 mg/dL 21 11 13 11 13 12 11   Creatinine, Ser 0.44 - 1.00 mg/dL 8.11 High  9.14 High  7.82 High  1.17 High  1.15 High  1.07 High  1.12 High   Calcium 8.9 - 10.3 mg/dL 9.0 8.8 Low  8.8 Low  8.6 Low  9.0 8.8 Low  9.0  Total Protein 6.5 - 8.1 g/dL 7.1 7.5 7.5 7.1 7.1 6.7 6.7  Albumin 3.5 - 5.0 g/dL 3.9 4.2 4.2 3.8 3.7 3.8 3.8  AST 15 - 41 U/L 33 36 28 27 26  38 56 High   ALT 0 - 44 U/L 25 26 20 19 20  39 53 High   Alkaline Phosphatase 38 - 126 U/L 54 63 65 65 52 70 71  Total Bilirubin <1.2 mg/dL 0.7 0.6 0.9 R 0.5 R 0.9 R 0.6 R 0.3 R  GFR, Estimated >60 mL/min 53 Low  52 Low  CM 47 Low  CM 51 Low  CM  57 Low  CM 54 Low  CM   Comment: (NOTE) Calculated using the CKD-EPI Creatinine Equation (2021)  Anion gap 5 - 15 9 7  CM 8 CM 7 CM 5 CM 6 CM 6 CM  Comment: Performed at [facility], [address] [place], [place] [address]  Resulting Agency [facility] CLIN LAB [facility] CLIN LAB [facility] CLIN LAB CH CLIN LAB CH CLIN LAB [facility] CL[facility] LAB [facility] LAB         Specimen Collected: 10/07/23 10:38 Last Resulted: 10/07/23 11:26

## 2023-10-08 NOTE — Progress Notes (Signed)
Specialty Pharmacy Refill Coordination Note  Megan Vega is a 67 y.o. female contacted today regarding refills of specialty medication(s) Abemaciclib   Patient requested Delivery   Delivery date: 10/17/23   Verified address: [address]   [place] [address]   Medication will be filled on 10/15/23.  Refill request pending.

## 2023-10-15 ENCOUNTER — Other Ambulatory Visit: Payer: Self-pay

## 2023-10-31 ENCOUNTER — Other Ambulatory Visit: Payer: Self-pay

## 2023-10-31 DIAGNOSIS — C50411 Malignant neoplasm of upper-outer quadrant of right female breast: Secondary | ICD-10-CM

## 2023-11-03 ENCOUNTER — Inpatient Hospital Stay (HOSPITAL_BASED_OUTPATIENT_CLINIC_OR_DEPARTMENT_OTHER): Payer: Medicare PPO | Admitting: Oncology

## 2023-11-03 ENCOUNTER — Inpatient Hospital Stay: Payer: Medicare PPO | Attending: Oncology

## 2023-11-03 ENCOUNTER — Inpatient Hospital Stay: Payer: Medicare PPO

## 2023-11-03 ENCOUNTER — Encounter: Payer: Self-pay | Admitting: Oncology

## 2023-11-03 VITALS — BP 107/69 | HR 78 | Temp 96.6°F | Resp 18 | Wt 152.6 lb

## 2023-11-03 DIAGNOSIS — D702 Other drug-induced agranulocytosis: Secondary | ICD-10-CM

## 2023-11-03 DIAGNOSIS — Z5111 Encounter for antineoplastic chemotherapy: Secondary | ICD-10-CM | POA: Insufficient documentation

## 2023-11-03 DIAGNOSIS — C50411 Malignant neoplasm of upper-outer quadrant of right female breast: Secondary | ICD-10-CM

## 2023-11-03 DIAGNOSIS — Z79899 Other long term (current) drug therapy: Secondary | ICD-10-CM | POA: Diagnosis not present

## 2023-11-03 DIAGNOSIS — Z79818 Long term (current) use of other agents affecting estrogen receptors and estrogen levels: Secondary | ICD-10-CM

## 2023-11-03 DIAGNOSIS — C7951 Secondary malignant neoplasm of bone: Secondary | ICD-10-CM | POA: Insufficient documentation

## 2023-11-03 LAB — CBC WITH DIFFERENTIAL (CANCER CENTER ONLY)
Abs Immature Granulocytes: 0.01 10*3/uL (ref 0.00–0.07)
Basophils Absolute: 0.1 10*3/uL (ref 0.0–0.1)
Basophils Relative: 2 %
Eosinophils Absolute: 0.1 10*3/uL (ref 0.0–0.5)
Eosinophils Relative: 3 %
HCT: 35.5 % — ABNORMAL LOW (ref 36.0–46.0)
Hemoglobin: 12.1 g/dL (ref 12.0–15.0)
Immature Granulocytes: 0 %
Lymphocytes Relative: 27 %
Lymphs Abs: 0.6 10*3/uL — ABNORMAL LOW (ref 0.7–4.0)
MCH: 33.8 pg (ref 26.0–34.0)
MCHC: 34.1 g/dL (ref 30.0–36.0)
MCV: 99.2 fL (ref 80.0–100.0)
Monocytes Absolute: 0.3 10*3/uL (ref 0.1–1.0)
Monocytes Relative: 11 %
Neutro Abs: 1.3 10*3/uL — ABNORMAL LOW (ref 1.7–7.7)
Neutrophils Relative %: 57 %
Platelet Count: 162 10*3/uL (ref 150–400)
RBC: 3.58 MIL/uL — ABNORMAL LOW (ref 3.87–5.11)
RDW: 13 % (ref 11.5–15.5)
WBC Count: 2.4 10*3/uL — ABNORMAL LOW (ref 4.0–10.5)
nRBC: 0 % (ref 0.0–0.2)

## 2023-11-03 LAB — CMP (CANCER CENTER ONLY)
ALT: 24 U/L (ref 0–44)
AST: 36 U/L (ref 15–41)
Albumin: 3.8 g/dL (ref 3.5–5.0)
Alkaline Phosphatase: 44 U/L (ref 38–126)
Anion gap: 9 (ref 5–15)
BUN: 13 mg/dL (ref 8–23)
CO2: 26 mmol/L (ref 22–32)
Calcium: 9 mg/dL (ref 8.9–10.3)
Chloride: 107 mmol/L (ref 98–111)
Creatinine: 1.03 mg/dL — ABNORMAL HIGH (ref 0.44–1.00)
GFR, Estimated: 60 mL/min — ABNORMAL LOW (ref >60)
Glucose, Bld: 98 mg/dL (ref 70–99)
Potassium: 4.2 mmol/L (ref 3.5–5.1)
Sodium: 142 mmol/L (ref 135–145)
Total Bilirubin: 0.8 mg/dL (ref <1.2)
Total Protein: 7 g/dL (ref 6.5–8.1)

## 2023-11-03 MED ORDER — FULVESTRANT 250 MG/5ML IM SOSY
500.0000 mg | PREFILLED_SYRINGE | Freq: Once | INTRAMUSCULAR | Status: AC
  Administered 2023-11-03: 500 mg via INTRAMUSCULAR
  Filled 2023-11-03: qty 10

## 2023-11-03 NOTE — Progress Notes (Signed)
Hematology/Oncology Consult note [facility]  Telephone:([phone] [phone]  Patient Care Team: Allegra Grana, FNP as PCP - General (Family Medicine) Jim Like, RN as Oncology Nurse Navigator Byrnett, Merrily Pew, MD (General Surgery) Glori Luis, MD as Consulting Physician (Family Medicine) Lonell Face, MD as Consulting Physician (Neurology) Creig Hines, MD as Consulting Physician (Oncology) Carmina Miller, MD as Consulting Physician (Radiation Oncology)   Name of the patient: Megan Vega  [id number]  Nov 12, 1956   Date of visit: 11/03/23  Diagnosis-  history of ER/PR positive HER2 negative breast cancer now with isolated L5 bone metastases stage III right breast cancer and left breast DCIS in 2019     Chief complaint/ Reason for visit-routine follow-up of metastatic breast cancer with isolated L5 metastases currently on Faslodex plus Verzenio  Heme/Onc history: Megan Vega is a 67 y.o. female with clinical stage T2N3bM0 right breast cancer and DCIS in the left breast s/p neoadjuvant chemotherapy followed by left simple mastectomy and right modified radical mastectomy on 07/10/2018.  Biopsy showed 1.4 cm grade 2 invasive mammary carcinoma ER greater than 90% positive PR greater than 90% positive and HER2 negative +1   Patient received neoadjuvant AC Taxol chemotherapy until July 2019.   Left breast pathology revealed 2.8 cm residual high grade DCIS, comedo type.  There was atypical lobular hyperplasia.  Four sentinel lymph nodes were negative.  Right breast pathology revealed 3.6 cm residual grade II invasive mammary carcinoma and high grade DCIS.  There was lymphovascular invasion.There was metastatic carcinoma in 2 sentinel lymph nodes.  Right axillary dissection revealed metastatic carcinoma in 7 lymph nodes.  There were a total of 9 lymph nodes with macrometastasis (largest 22 mm).  Pathologic stage was ypT2  ypN2a.   Patient then received adjuvant radiation treatment and started letrozole in February 2020   She underwent right breast reconstruction with latissimus myocutaneous flap and removal of her port-a-cath on 02/07/2020.  Expanders were taken out in 05/2020 secondary to infection.  She underwent excision of left breast wound with closure on 10/02/2020. Pathology revealed inflamed granulation tissue and fibrosis. There was no evidence of malignancy.  She received adjuvant Zometa for 3 years   Patient found to have L5 vertebral lesion that was hypermetabolic on PET scan.  Biopsies negative for malignancy.  There was no lesions seen elsewhere.  A repeat scans in May 2024 again confirmed the presence of L5 vertebral lesion and patient had a second biopsy with osteo-cool procedure/kyphoplasty.Biopsy was consistent with metastatic adenocarcinoma compatible with breast primary.  ER 100% positive, PR 5% positive and HER2 negative.  Not enough tissue available for NGS testing.  Patient started on Faslodex plus Verzenio in July 2024  Interval history-patient is doing well on the present dose of Faslodex at 100 mg twice daily.  Denies any significant GI side effects  ECOG PS- 1 Pain scale- 0  Review of systems- Review of Systems  Constitutional:  Negative for chills, fever, malaise/fatigue and weight loss.  HENT:  Negative for congestion, ear discharge and nosebleeds.   Eyes:  Negative for blurred vision.  Respiratory:  Negative for cough, hemoptysis, sputum production, shortness of breath and wheezing.   Cardiovascular:  Negative for chest pain, palpitations, orthopnea and claudication.  Gastrointestinal:  Negative for abdominal pain, blood in stool, constipation, diarrhea, heartburn, melena, nausea and vomiting.  Genitourinary:  Negative for dysuria, flank pain, frequency, hematuria and urgency.  Musculoskeletal:  Negative for back pain, joint  pain and myalgias.  Skin:  Negative for rash.   Neurological:  Negative for dizziness, tingling, focal weakness, seizures, weakness and headaches.  Endo/Heme/Allergies:  Does not bruise/bleed easily.  Psychiatric/Behavioral:  Negative for depression and suicidal ideas. The patient does not have insomnia.       Allergies  Allergen Reactions   Peanut-Containing Drug Products Swelling and Other (See Comments)    Only when eating too many peanuts, swelling with lips and tingly face     Past Medical History:  Diagnosis Date   Ankle fracture    Breast cancer (HCC)    bilateral   Cancer (HCC)    Basal Cell Carcinoma, about 2011 chest   Cataract    Family history of adverse reaction to anesthesia    daughter with PONV   Hemorrhoids    History of methicillin resistant staphylococcus aureus (MRSA) 2015   Migraines    MIGRAINES occ   Personal history of chemotherapy    Personal history of radiation therapy    Wears glasses      Past Surgical History:  Procedure Laterality Date   BREAST BIOPSY Bilateral 10/29/2017   L breast DCIS, R breast invasive mammary carcinoma of no special type, ER/PR+, Her2/neu 1+   BREAST RECONSTRUCTION Right 02/07/2020   Procedure: BREAST RECONSTRUCTION;  Surgeon: Peggye Form, DO;  [facility]: [facility];  Service: Plastics;  Laterality: Right;   BREAST RECONSTRUCTION WITH PLACEMENT OF TISSUE EXPANDER AND FLEX HD (ACELLULAR HYDRATED DERMIS) Bilateral 08/30/2019   Procedure: BREAST RECONSTRUCTION WITH PLACEMENT OF TISSUE EXPANDER AND FLEX HD (ACELLULAR HYDRATED DERMIS);  Surgeon: Peggye Form, DO;  Location: [facility];  Service: Plastics;  Laterality: Bilateral;  2.5 hours, please   CESAREAN SECTION     COLONOSCOPY  2012   COLONOSCOPY WITH PROPOFOL N/A 12/01/2018   Procedure: COLONOSCOPY WITH PROPOFOL;  Surgeon: Midge Minium, MD;  Location: [facility];  Service: Endoscopy;  Laterality: N/A;   DEBRIDEMENT AND CLOSURE WOUND Bilateral 10/02/2020   Procedure: Excision of bilateral breast wound;   Surgeon: Peggye Form, DO;  Location: [facility];  Service: Plastics;  Laterality: Bilateral;  1.5 hours, please   FRACTURE SURGERY     ankle   IR CT BONE TROCAR/NEEDLE BIOPSY DEEP  12/26/2022   IR KYPHO LUMBAR INC FX REDUCE BONE BX UNI/BIL CANNULATION INC/IMAGING  05/14/2023   IR RADIOLOGIST EVAL & MGMT  04/28/2023   IR RADIOLOGIST EVAL & MGMT  06/27/2023   IRRIGATION AND DEBRIDEMENT ABSCESS Right 03/12/2018   Procedure: IRRIGATION AND DEBRIDEMENT RIGHT AXILLARY ABSCESS;  Surgeon: Earline Mayotte, MD;  Location: [facility];  Service: General;  Laterality: Right;   LATISSIMUS FLAP TO BREAST Right 02/07/2020   Procedure: LATISSIMUS MYOCUTANEOUS FLAP TO BREAST;  Surgeon: Peggye Form, DO;  Location: [facility];  Service: Plastics;  Laterality: Right;  3 hours   MANDIBLE FRACTURE SURGERY     MASTECTOMY     MASTECTOMY MODIFIED RADICAL Right 07/10/2018   ypT2 ypN2a ER/PR positive, HER-2/neu negative  Surgeon: Earline Mayotte, MD;  Location: [facility];  Service: General;  Laterality: Right;   PORT-A-CATH REMOVAL Left 02/07/2020   Procedure: REMOVAL PORT-A-CATH;  Surgeon: Peggye Form, DO;  Location: [facility];  Service: Plastics;  Laterality: Left;   PORTACATH PLACEMENT Left 11/28/2017   Procedure: INSERTION PORT-A-CATH;  Surgeon: Earline Mayotte, MD;  Location: [facility];  Service: General;  Laterality: Left;   REMOVAL OF TISSUE EXPANDER Right 06/05/2020   Procedure: TISSUE EXPANDER  REMOVAL;  Surgeon: Peggye Form, DO;  Location: [facility];  Service: Plastics;  Laterality: Right;   SIMPLE MASTECTOMY WITH AXILLARY SENTINEL NODE BIOPSY Left 07/10/2018   High-grade DCIS SIMPLE MASTECTOMY;  Surgeon: Earline Mayotte, MD;  Location: [facility];  Service: General;  Laterality: Left;   TISSUE EXPANDER PLACEMENT Left 06/05/2020   Procedure: TISSUE EXPANDER FLUID REMOVAL-100ML;  Surgeon: Peggye Form, DO;  Location: [facility];  Service: Plastics;  Laterality: Left;    TISSUE EXPANDER PLACEMENT Left 06/14/2020   Procedure: TISSUE EXPANDER REMOVAL;  Surgeon: Peggye Form, DO;  Location: [facility];  Service: Plastics;  Laterality: Left;  45 min, please    Social History   Socioeconomic History   Marital status: Divorced    Spouse name: Not on file   Number of children: 2   Years of education: Not on file   Highest education level: Not on file  Occupational History   Occupation: Retired  Tobacco Use   Smoking status: Former    Current packs/day: 0.00    Average packs/day: 0.3 packs/day for 10.0 years (2.5 ttl pk-yrs)    Types: Cigarettes    Start date: 85    Quit date: 1978    Years since quitting: 46.9   Smokeless tobacco: Never  Vaping Use   Vaping status: Never Used  Substance and Sexual Activity   Alcohol use: Yes    Alcohol/week: 1.0 standard drink of alcohol    Types: 1 Glasses of wine per week    Comment: " occasional glass of wine "   Drug use: No   Sexual activity: Not Currently    Birth control/protection: Post-menopausal  Other Topics Concern   Not on file  Social History Narrative   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    Lives at home (son 77 yo lives with her)   Children 2   Pets: 3 small dogs and 1 frog    Caffeine- 1 cup of coffee in the morning, rare tea   Social Drivers of Corporate investment banker Strain: Low Risk  (04/02/2023)   Overall Financial Resource Strain (CARDIA)    Difficulty of Paying Living Expenses: Not hard at all  Food Insecurity: No Food Insecurity (04/02/2023)   Hunger Vital Sign    Worried About Running Out of Food in the Last Year: Never true    Ran Out of Food in the Last Year: Never true  Transportation Needs: No Transportation Needs (04/02/2023)   PRAPARE - Administrator, Civil Service (Medical): No    Lack of Transportation (Non-Medical): No  Physical Activity: Sufficiently Active (04/02/2023)   Exercise Vital Sign    Days of Exercise per Week: 7 days    Minutes of  Exercise per Session: 30 min  Stress: No Stress Concern Present (04/02/2023)   [employer] of Occupational Health - Occupational Stress Questionnaire    Feeling of Stress : Not at all  Social Connections: Moderately Isolated (04/02/2023)   Social Connection and Isolation Panel [NHANES]    Frequency of Communication with Friends and Family: More than three times a week    Frequency of Social Gatherings with Friends and Family: Once a week    Attends Religious Services: More than 4 times per year    Active Member of [employer] or Organizations: No    Attends Banker Meetings: Never    Marital Status: Divorced  Catering manager Violence: Not At Risk (04/02/2023)   Humiliation,  Afraid, Rape, and Kick questionnaire    Fear of Current or Ex-Partner: No    Emotionally Abused: No    Physically Abused: No    Sexually Abused: No    Family History  Problem Relation Age of Onset   Cancer Mother        Esophageal Cancer   Early death Mother        Age 76   Cancer Father        Lung Cancer   Diabetes Father    Diabetes Brother        Controlled with diet   Heart attack Paternal Grandfather    Colon cancer Neg Hx      Current Outpatient Medications:    abemaciclib (VERZENIO) 100 MG tablet, Take 1 tablet (100 mg total) by mouth 2 (two) times daily., Disp: 56 tablet, Rfl: 0   b complex vitamins tablet, Take 1 tablet by mouth daily., Disp: , Rfl:    Calcium-Magnesium-Vitamin D (CALCIUM 1200+D3 PO), Take 1,200 mg by mouth daily. , Disp: , Rfl:    diphenoxylate-atropine (LOMOTIL) 2.5-0.025 MG tablet, Take 2 tablets by mouth 4 (four) times daily as needed for diarrhea or loose stools., Disp: 30 tablet, Rfl: 1   loperamide (IMODIUM) 2 MG capsule, Take 2 tabs by mouth with first loose stool, then 1 tab with each additional loose stool as needed. Do not exceed 8 tabs in a 24-hour period, Disp: 60 capsule, Rfl: 2   loratadine (CLARITIN) 10 MG tablet, Take 10 mg by mouth every morning. ,  Disp: , Rfl:    Melatonin 5 MG CAPS, Take 5 mg by mouth daily as needed (sleep).  (Patient not taking: Reported on 08/13/2023), Disp: , Rfl:    Multiple Vitamin (MULTIVITAMIN WITH MINERALS) TABS tablet, Take 1 tablet by mouth daily. Centrum Silver, Disp: , Rfl:    Naphazoline HCl (CLEAR EYES OP), Place 1 drop into both eyes daily., Disp: , Rfl:    ondansetron (ZOFRAN) 8 MG tablet, Take 1 tablet (8 mg total) by mouth every 8 (eight) hours as needed for nausea or vomiting., Disp: 15 tablet, Rfl: 0   Probiotic Product (PROBIOTIC PO), Take 1 capsule by mouth daily., Disp: , Rfl:    prochlorperazine (COMPAZINE) 10 MG tablet, Take 1 tablet (10 mg total) by mouth every 6 (six) hours as needed for nausea or vomiting., Disp: 30 tablet, Rfl: 1   traMADol (ULTRAM) 50 MG tablet, Take 1 tablet (50 mg total) by mouth every 6 (six) hours as needed for moderate pain., Disp: 10 tablet, Rfl: 0 No current facility-administered medications for this visit.  Facility-Administered Medications Ordered in Other Visits:    heparin lock flush 100 unit/mL, 500 Units, Intracatheter, PRN, Rosey Bath, MD  Physical exam:  Vitals:   11/03/23 1015  BP: 107/69  Pulse: 78  Resp: 18  Temp: (!) 96.6 F (35.9 C)  TempSrc: Tympanic  SpO2: 100%  Weight: 152 lb 9.6 oz (69.2 kg)   Physical Exam Cardiovascular:     Rate and Rhythm: Normal rate and regular rhythm.     Heart sounds: Normal heart sounds.  Pulmonary:     Effort: Pulmonary effort is normal.     Breath sounds: Normal breath sounds.  Skin:    General: Skin is warm and dry.  Neurological:     Mental Status: She is alert and oriented to person, place, and time.         Latest Ref Rng & Units 11/03/2023  9:46 AM  CMP  Glucose 70 - 99 mg/dL 98   BUN 8 - 23 mg/dL 13   Creatinine 1.61 - 1.00 mg/dL 0.96   Sodium 045 - 409 mmol/L 142   Potassium 3.5 - 5.1 mmol/L 4.2   Chloride 98 - 111 mmol/L 107   CO2 22 - 32 mmol/L 26   Calcium 8.9 - 10.3 mg/dL  9.0   Total Protein 6.5 - 8.1 g/dL 7.0   Total Bilirubin <8.1 mg/dL 0.8   Alkaline Phos 38 - 126 U/L 44   AST 15 - 41 U/L 36   ALT 0 - 44 U/L 24       Latest Ref Rng & Units 11/03/2023    9:46 AM  CBC  WBC 4.0 - 10.5 K/uL 2.4   Hemoglobin 12.0 - 15.0 g/dL 19.1   Hematocrit 47.8 - 46.0 % 35.5   Platelets 150 - 400 K/uL 162     No images are attached to the encounter.  No results found.   Assessment and plan- Patient is a 67 y.o. female with history of metastatic ER/PR positive HER2 negative breast cancer with isolated L5 metastases.  She is currently on Verzenio plus Faslodex and this is a routine follow-up visit  Patient has leukopenia/neutropenia but ANC remains more than 1.  She will continue with Verzenio 100 mg twice daily along with Faslodex monthly injections until progression or toxicity.  We will plan to get repeat CT scans sometime in April 2025.  Isolated L5 metastases: She is currently on Zometa but I will plan to give it to her every 6 months given low burden of disease and risk of osteonecrosis of the jaw associated with Zometa  We will continue monthly labs at this point and I will see her back in 3 months for next dose of Zometa   Visit Diagnosis 1. Primary cancer of upper outer quadrant of right breast (HCC)   2. High risk medication use   3. Drug-induced neutropenia (HCC)   4. Use of fulvestrant (Faslodex)      Dr. Owens Shark, MD, MPH [facility] [phone] 11/03/2023 12:28 PM

## 2023-11-04 LAB — CANCER ANTIGEN 27.29: CA 27.29: 13.7 U/mL (ref 0.0–38.6)

## 2023-11-04 LAB — CANCER ANTIGEN 15-3: CA 15-3: 12.5 U/mL (ref 0.0–25.0)

## 2023-11-11 ENCOUNTER — Other Ambulatory Visit: Payer: Self-pay

## 2023-11-11 ENCOUNTER — Other Ambulatory Visit: Payer: Self-pay | Admitting: Oncology

## 2023-11-11 DIAGNOSIS — C50411 Malignant neoplasm of upper-outer quadrant of right female breast: Secondary | ICD-10-CM

## 2023-11-11 NOTE — Progress Notes (Signed)
Specialty Pharmacy Refill Coordination Note  Megan Vega is a 67 y.o. female contacted today regarding refills of specialty medication(s) Abemaciclib [name]   Patient requested Delivery   Delivery date: 11/17/23   Verified address: [address]   [place] [address]   Medication will be filled on 12/27, pending refill approval.

## 2023-11-13 ENCOUNTER — Other Ambulatory Visit: Payer: Self-pay

## 2023-11-13 ENCOUNTER — Other Ambulatory Visit (HOSPITAL_COMMUNITY): Payer: Self-pay

## 2023-11-13 MED ORDER — ABEMACICLIB 100 MG PO TABS
100.0000 mg | ORAL_TABLET | Freq: Two times a day (BID) | ORAL | 0 refills | Status: DC
  Filled 2023-11-13: qty 56, 28d supply, fill #0

## 2023-11-13 NOTE — Telephone Encounter (Signed)
CBC with Differential (Cancer Center Only) Order: [id number]  Status: Final result     Next appt: 12/08/2023 at 11:15 AM in Oncology ([facility])     Dx: Primary cancer of upper outer quadran...   Test Result Released: Yes (seen)   0 Result Notes          Component Ref Range & Units (hover) 10 d ago (11/03/23) 1 mo ago (10/07/23) 1 mo ago (09/23/23) 2 mo ago (09/09/23) 2 mo ago (09/02/23) 2 mo ago (08/26/23) 2 mo ago (08/19/23)  WBC Count 2.4 Low  2.7 Low  3.1 Low  2.7 Low  2.7 Low  2.5 Low  2.6 Low   RBC 3.58 Low  3.82 Low  3.88 3.86 Low  3.66 Low  4.01 3.67 Low   Hemoglobin 12.1 13.1 13.2 12.9 12.2 13.1 12.1  HCT 35.5 Low  37.6 37.8 37.8 36.1 39.5 35.9 Low   MCV 99.2 98.4 97.4 97.9 98.6 98.5 97.8  MCH 33.8 34.3 High  34.0 33.4 33.3 32.7 33.0  MCHC 34.1 34.8 34.9 34.1 33.8 33.2 33.7  RDW 13.0 12.6 12.7 13.5 14.2 14.5 15.3  Platelet Count 162 177 199 212 177 158 189  nRBC 0.0 0.0 0.0 0.0 0.0 0.0 0.0  Neutrophils Relative % 57 58 57 46 58 55 53  Neutro Abs 1.3 Low  1.6 Low  1.8 1.2 Low  1.5 Low  1.4 Low  1.4 Low   Lymphocytes Relative 27 26 27  33 28 30 28   Lymphs Abs 0.6 Low  0.7 0.8 0.9 0.8 0.7 0.7  Monocytes Relative 11 10 9 13 9 7 10   Monocytes Absolute 0.3 0.3 0.3 0.3 0.3 0.2 0.3  Eosinophils Relative 3 3 3 4 3 5 6   Eosinophils Absolute 0.1 0.1 0.1 0.1 0.1 0.1 0.2  Basophils Relative 2 3 4 4 2 3 3   Basophils Absolute 0.1 0.1 0.1 0.1 0.1 0.1 0.1  Immature Granulocytes 0 0 0 0 0 0 0  Abs Immature Granulocytes 0.01 0.01 CM 0.01 CM 0.01 CM 0.01 CM 0.01 CM 0.01 CM  Comment: Performed at [facility], [address] [place], [place] [address]  Resulting Agency [facility]        Specimen Collected: 11/03/23 09:46 Last Resulted: 11/03/23 10:02      Lab Flowsheet      Order Details      View Encounter      Lab and Collection Details      Routing      Result History    View All  Conversations on this Encounter    CM=Additional comments    Result Care Coordination   Patient Communication   Add Comments   Seen Back to Top   Other Results from 11/03/2023  Cancer antigen 27.29 Order: [id number]  Status: Final result     Next appt: 12/08/2023 at 11:15 AM in Oncology ([facility])     Dx: Primary cancer of upper outer quadran...     Test Result Released: Yes (seen)   0 Result Notes          Component Ref Range & Units (hover) 10 d ago (11/03/23) 1 mo ago (09/23/23) 6 mo ago (04/22/23) 2 yr ago (05/22/21) 2 yr ago (12/27/20) 3 yr ago (06/26/20) 3 yr ago (02/21/20)  CA 27.29 13.7 11.7 CM 9.9 CM 9.7 CM 6.6 CM 5.8 CM 11.2  CM  Comment: (NOTE) Siemens Centaur Immunochemiluminometric Methodology [facility]) Values obtained with different assay methods or kits cannot be used interchangeably. Results cannot be interpreted as absolute evidence of the presence or absence of malignant disease. Performed At: [facility] [address] [place], [place] [id number] Jolene Schimke MD NU:[id number]  Resulting Agency [facility]        Specimen Collected: 11/03/23 09:46 Last Resulted: 11/04/23 06:36      Lab Flowsheet      Order Details      View Encounter      Lab and Collection Details      Routing      Result History    View All Conversations on this Encounter    CM=Additional comments    Result Care Coordination   Patient Communication   Add Comments   Seen Back to Top     Cancer antigen 15-3 Order: [id number]  Status: Final result     Next appt: 12/08/2023 at 11:15 AM in Oncology ([facility])     Dx: Primary cancer of upper outer quadran...     Test Result Released: Yes (seen)   0 Result Notes      Component Ref Range & Units (hover) 10 d ago 1 mo ago 6 mo ago  CA 15-3 12.5 14.9 CM 9.7 CM  Comment: (NOTE) Roche Diagnostics Electrochemiluminescence Immunoassay  (ECLIA) Values obtained with different assay methods or kits cannot be used interchangeably.  Results cannot be interpreted as absolute evidence of the presence or absence of malignant disease. Performed At: [facility] [address] [place], [place] [id number] Jolene Schimke MD RJ:[id number]  Resulting Agency [facility] LAB        Specimen Collected: 11/03/23 09:46 Last Resulted: 11/04/23 22:35      Lab Flowsheet      Order Details      View Encounter      Lab and Collection Details      Routing      Result History    View All Conversations on this Encounter    CM=Additional comments    Result Care Coordination   Patient Communication   Add Comments   Seen Back to Top      Contains abnormal data CMP (Cancer Center only) Order: [id number]  Status: Final result     Next appt: 12/08/2023 at 11:15 AM in Oncology ([facility])     Dx: Primary cancer of upper outer quadran...     Test Result Released: Yes (seen)   0 Result Notes          Component Ref Range & Units (hover) 10 d ago (11/03/23) 1 mo ago (10/07/23) 1 mo ago (09/23/23) 2 mo ago (08/26/23) 2 mo ago (08/19/23) 3 mo ago (08/13/23) 3 mo ago (07/23/23)  Sodium 142 139 135 136 138 136 135  Potassium 4.2 4.5 4.1 3.9 4.2 4.0 4.3  Chloride 107 105 103 104 105 105 104  CO2 26 25 25 24 26 26 25   Glucose, Bld 98 96 CM 96 CM 110 High  CM 83 CM 77 CM 129 High  CM  Comment: Glucose reference range applies only to samples taken after fasting for at least 8 hours.  BUN 13 21 11 13 11 13 12   Creatinine 1.03 High  1.13 High  1.16 High  1.25 High  1.17  High  1.15 High  1.07 High   Calcium 9.0 9.0 8.8 Low  8.8 Low  8.6 Low  9.0 8.8 Low   Total Protein 7.0 7.1 7.5 7.5 7.1 7.1 6.7  Albumin 3.8 3.9 4.2 4.2 3.8 3.7 3.8  AST 36 33 36 28 27 26  38  ALT 24 25 26 20 19 20  39  Alkaline Phosphatase 44 54 63 65 65 52 70  Total Bilirubin 0.8 0.7 0.6 0.9 R 0.5 R 0.9 R 0.6 R  GFR, Estimated 60 Low       52 Low  CM   Comment: (NOTE) Calculated using the CKD-EPI Creatinine Equation (2021)  Anion gap 9 9 CM 7 CM 8 CM 7 CM 5 CM 6 CM  Comment: Performed at [facility], [address] [place], [place] [address]  Resulting Agency [facility]        Specimen Collected: 11/03/23 09:46 Last Resulted: 11/03/23 10:13

## 2023-11-14 ENCOUNTER — Other Ambulatory Visit: Payer: Self-pay

## 2023-11-20 ENCOUNTER — Encounter: Payer: Self-pay | Admitting: Oncology

## 2023-11-20 ENCOUNTER — Encounter: Payer: Self-pay | Admitting: Hematology and Oncology

## 2023-12-03 ENCOUNTER — Other Ambulatory Visit: Payer: Self-pay

## 2023-12-03 ENCOUNTER — Other Ambulatory Visit: Payer: Self-pay | Admitting: Oncology

## 2023-12-03 DIAGNOSIS — C50411 Malignant neoplasm of upper-outer quadrant of right female breast: Secondary | ICD-10-CM

## 2023-12-03 NOTE — Progress Notes (Signed)
 Specialty Pharmacy Refill Coordination Note  Megan Vega is a 68 y.o. female contacted today regarding refills of specialty medication(s) Abemaciclib  (VERZENIO )   Patient requested Delivery   Delivery date: 12/12/23   Verified address: [address]   [place] [address]   Medication will be filled on 12/11/23.

## 2023-12-03 NOTE — Progress Notes (Signed)
 Specialty Pharmacy Ongoing Clinical Assessment Note  Megan Vega is a 68 y.o. female who is being followed by the specialty pharmacy service for RxSp Oncology   Patient's specialty medication(s) reviewed today: Abemaciclib  (VERZENIO )   Missed doses in the last 4 weeks: 0   Patient/Caregiver did not have any additional questions or concerns.   Therapeutic benefit summary: Patient is achieving benefit   Adverse events/side effects summary: No adverse events/side effects   Patient's therapy is appropriate to: Continue    Goals Addressed             This Visit's Progress    Slow Disease Progression       Patient is on track. Patient will maintain adherence. Next CT scan due in April.          Follow up:  6 months   Holland M Jaeden Messer Specialty Pharmacist

## 2023-12-04 ENCOUNTER — Other Ambulatory Visit: Payer: Medicare PPO

## 2023-12-04 ENCOUNTER — Ambulatory Visit: Payer: Medicare PPO

## 2023-12-05 ENCOUNTER — Other Ambulatory Visit: Payer: Self-pay

## 2023-12-05 DIAGNOSIS — C50411 Malignant neoplasm of upper-outer quadrant of right female breast: Secondary | ICD-10-CM

## 2023-12-08 ENCOUNTER — Inpatient Hospital Stay: Payer: Medicare PPO

## 2023-12-08 ENCOUNTER — Encounter: Payer: Self-pay | Admitting: Oncology

## 2023-12-08 ENCOUNTER — Inpatient Hospital Stay: Payer: Medicare PPO | Attending: Oncology

## 2023-12-08 DIAGNOSIS — Z5111 Encounter for antineoplastic chemotherapy: Secondary | ICD-10-CM | POA: Insufficient documentation

## 2023-12-08 DIAGNOSIS — Z1721 Progesterone receptor positive status: Secondary | ICD-10-CM | POA: Insufficient documentation

## 2023-12-08 DIAGNOSIS — C50411 Malignant neoplasm of upper-outer quadrant of right female breast: Secondary | ICD-10-CM | POA: Diagnosis not present

## 2023-12-08 DIAGNOSIS — Z1732 Human epidermal growth factor receptor 2 negative status: Secondary | ICD-10-CM | POA: Insufficient documentation

## 2023-12-08 DIAGNOSIS — Z170 Estrogen receptor positive status [ER+]: Secondary | ICD-10-CM | POA: Insufficient documentation

## 2023-12-08 DIAGNOSIS — C7951 Secondary malignant neoplasm of bone: Secondary | ICD-10-CM | POA: Diagnosis not present

## 2023-12-08 LAB — CBC WITH DIFFERENTIAL (CANCER CENTER ONLY)
Abs Immature Granulocytes: 0.01 10*3/uL (ref 0.00–0.07)
Basophils Absolute: 0.1 10*3/uL (ref 0.0–0.1)
Basophils Relative: 3 %
Eosinophils Absolute: 0.1 10*3/uL (ref 0.0–0.5)
Eosinophils Relative: 2 %
HCT: 37.3 % (ref 36.0–46.0)
Hemoglobin: 13.1 g/dL (ref 12.0–15.0)
Immature Granulocytes: 0 %
Lymphocytes Relative: 31 %
Lymphs Abs: 0.8 10*3/uL (ref 0.7–4.0)
MCH: 34.9 pg — ABNORMAL HIGH (ref 26.0–34.0)
MCHC: 35.1 g/dL (ref 30.0–36.0)
MCV: 99.5 fL (ref 80.0–100.0)
Monocytes Absolute: 0.4 10*3/uL (ref 0.1–1.0)
Monocytes Relative: 14 %
Neutro Abs: 1.3 10*3/uL — ABNORMAL LOW (ref 1.7–7.7)
Neutrophils Relative %: 50 %
Platelet Count: 176 10*3/uL (ref 150–400)
RBC: 3.75 MIL/uL — ABNORMAL LOW (ref 3.87–5.11)
RDW: 12.5 % (ref 11.5–15.5)
WBC Count: 2.6 10*3/uL — ABNORMAL LOW (ref 4.0–10.5)
nRBC: 0 % (ref 0.0–0.2)

## 2023-12-08 LAB — CMP (CANCER CENTER ONLY)
ALT: 23 U/L (ref 0–44)
AST: 31 U/L (ref 15–41)
Albumin: 3.9 g/dL (ref 3.5–5.0)
Alkaline Phosphatase: 49 U/L (ref 38–126)
Anion gap: 7 (ref 5–15)
BUN: 17 mg/dL (ref 8–23)
CO2: 24 mmol/L (ref 22–32)
Calcium: 8.9 mg/dL (ref 8.9–10.3)
Chloride: 104 mmol/L (ref 98–111)
Creatinine: 1.38 mg/dL — ABNORMAL HIGH (ref 0.44–1.00)
GFR, Estimated: 42 mL/min — ABNORMAL LOW (ref >60)
Glucose, Bld: 97 mg/dL (ref 70–99)
Potassium: 4.4 mmol/L (ref 3.5–5.1)
Sodium: 135 mmol/L (ref 135–145)
Total Bilirubin: 0.9 mg/dL (ref 0.0–1.2)
Total Protein: 7.2 g/dL (ref 6.5–8.1)

## 2023-12-08 MED ORDER — FULVESTRANT 250 MG/5ML IM SOSY
500.0000 mg | PREFILLED_SYRINGE | Freq: Once | INTRAMUSCULAR | Status: AC
  Administered 2023-12-08: 500 mg via INTRAMUSCULAR
  Filled 2023-12-08: qty 10

## 2023-12-09 ENCOUNTER — Telehealth: Payer: Self-pay

## 2023-12-09 NOTE — Telephone Encounter (Signed)
Called left voicemail Dr.Rao's advice

## 2023-12-09 NOTE — Telephone Encounter (Signed)
-----   Message from Creig Hines sent at 12/08/2023 11:33 AM EST ----- Creatinine is elevated. Please ask her to increase her fluid intake over next week or so

## 2023-12-11 ENCOUNTER — Other Ambulatory Visit: Payer: Self-pay

## 2023-12-11 NOTE — Progress Notes (Addendum)
12/11/23 CMA: Verzenio  Left voicemail for patient to call Specialty Pharmacy. new rx has not been called in. Patient should contact MD office.

## 2023-12-23 ENCOUNTER — Other Ambulatory Visit: Payer: Self-pay

## 2023-12-23 MED ORDER — ABEMACICLIB 100 MG PO TABS
100.0000 mg | ORAL_TABLET | Freq: Two times a day (BID) | ORAL | 0 refills | Status: DC
  Filled 2023-12-23: qty 56, 28d supply, fill #0

## 2023-12-23 NOTE — Progress Notes (Signed)
 Specialty Pharmacy Refill Coordination Note  Megan Vega is a 68 y.o. female contacted today regarding refills of specialty medication(s) Abemaciclib  (VERZENIO )   Patient requested Delivery   Delivery date: 12/24/23   Verified address: [address]   [place] [address]   Medication will be filled on 12/23/23.

## 2024-01-05 ENCOUNTER — Other Ambulatory Visit: Payer: Self-pay

## 2024-01-05 ENCOUNTER — Other Ambulatory Visit: Payer: Medicare PPO

## 2024-01-05 ENCOUNTER — Ambulatory Visit: Payer: Medicare PPO

## 2024-01-05 DIAGNOSIS — C50411 Malignant neoplasm of upper-outer quadrant of right female breast: Secondary | ICD-10-CM

## 2024-01-05 DIAGNOSIS — T451X5A Adverse effect of antineoplastic and immunosuppressive drugs, initial encounter: Secondary | ICD-10-CM

## 2024-01-06 ENCOUNTER — Inpatient Hospital Stay: Payer: Medicare PPO

## 2024-01-06 ENCOUNTER — Encounter: Payer: Self-pay | Admitting: Oncology

## 2024-01-06 ENCOUNTER — Inpatient Hospital Stay: Payer: Medicare PPO | Attending: Oncology

## 2024-01-06 DIAGNOSIS — Z5111 Encounter for antineoplastic chemotherapy: Secondary | ICD-10-CM | POA: Diagnosis not present

## 2024-01-06 DIAGNOSIS — Z1732 Human epidermal growth factor receptor 2 negative status: Secondary | ICD-10-CM | POA: Insufficient documentation

## 2024-01-06 DIAGNOSIS — C7951 Secondary malignant neoplasm of bone: Secondary | ICD-10-CM | POA: Diagnosis not present

## 2024-01-06 DIAGNOSIS — Z79818 Long term (current) use of other agents affecting estrogen receptors and estrogen levels: Secondary | ICD-10-CM | POA: Insufficient documentation

## 2024-01-06 DIAGNOSIS — Z170 Estrogen receptor positive status [ER+]: Secondary | ICD-10-CM | POA: Insufficient documentation

## 2024-01-06 DIAGNOSIS — Z1721 Progesterone receptor positive status: Secondary | ICD-10-CM | POA: Insufficient documentation

## 2024-01-06 DIAGNOSIS — C50411 Malignant neoplasm of upper-outer quadrant of right female breast: Secondary | ICD-10-CM | POA: Insufficient documentation

## 2024-01-06 DIAGNOSIS — T451X5A Adverse effect of antineoplastic and immunosuppressive drugs, initial encounter: Secondary | ICD-10-CM

## 2024-01-06 LAB — CBC WITH DIFFERENTIAL (CANCER CENTER ONLY)
Abs Immature Granulocytes: 0.01 10*3/uL (ref 0.00–0.07)
Basophils Absolute: 0.1 10*3/uL (ref 0.0–0.1)
Basophils Relative: 3 %
Eosinophils Absolute: 0.1 10*3/uL (ref 0.0–0.5)
Eosinophils Relative: 3 %
HCT: 39.4 % (ref 36.0–46.0)
Hemoglobin: 13.7 g/dL (ref 12.0–15.0)
Immature Granulocytes: 0 %
Lymphocytes Relative: 29 %
Lymphs Abs: 0.9 10*3/uL (ref 0.7–4.0)
MCH: 34.4 pg — ABNORMAL HIGH (ref 26.0–34.0)
MCHC: 34.8 g/dL (ref 30.0–36.0)
MCV: 99 fL (ref 80.0–100.0)
Monocytes Absolute: 0.3 10*3/uL (ref 0.1–1.0)
Monocytes Relative: 11 %
Neutro Abs: 1.6 10*3/uL — ABNORMAL LOW (ref 1.7–7.7)
Neutrophils Relative %: 54 %
Platelet Count: 224 10*3/uL (ref 150–400)
RBC: 3.98 MIL/uL (ref 3.87–5.11)
RDW: 11.9 % (ref 11.5–15.5)
WBC Count: 3.1 10*3/uL — ABNORMAL LOW (ref 4.0–10.5)
nRBC: 0 % (ref 0.0–0.2)

## 2024-01-06 LAB — CMP (CANCER CENTER ONLY)
ALT: 24 U/L (ref 0–44)
AST: 33 U/L (ref 15–41)
Albumin: 4.1 g/dL (ref 3.5–5.0)
Alkaline Phosphatase: 51 U/L (ref 38–126)
Anion gap: 8 (ref 5–15)
BUN: 16 mg/dL (ref 8–23)
CO2: 25 mmol/L (ref 22–32)
Calcium: 9.1 mg/dL (ref 8.9–10.3)
Chloride: 102 mmol/L (ref 98–111)
Creatinine: 1.16 mg/dL — ABNORMAL HIGH (ref 0.44–1.00)
GFR, Estimated: 52 mL/min — ABNORMAL LOW (ref >60)
Glucose, Bld: 98 mg/dL (ref 70–99)
Potassium: 3.8 mmol/L (ref 3.5–5.1)
Sodium: 135 mmol/L (ref 135–145)
Total Bilirubin: 0.9 mg/dL (ref 0.0–1.2)
Total Protein: 7.5 g/dL (ref 6.5–8.1)

## 2024-01-06 MED ORDER — FULVESTRANT 250 MG/5ML IM SOSY
500.0000 mg | PREFILLED_SYRINGE | Freq: Once | INTRAMUSCULAR | Status: AC
Start: 2024-01-06 — End: 2024-01-06
  Administered 2024-01-06: 500 mg via INTRAMUSCULAR
  Filled 2024-01-06: qty 10

## 2024-01-14 ENCOUNTER — Other Ambulatory Visit: Payer: Self-pay | Admitting: Oncology

## 2024-01-14 ENCOUNTER — Other Ambulatory Visit: Payer: Self-pay

## 2024-01-14 DIAGNOSIS — C50411 Malignant neoplasm of upper-outer quadrant of right female breast: Secondary | ICD-10-CM

## 2024-01-14 NOTE — Progress Notes (Signed)
 Specialty Pharmacy Refill Coordination Note  Megan Vega is a 68 y.o. female contacted today regarding refills of specialty medication(s) Abemaciclib [name]   Patient requested Delivery   Delivery date: 01/19/24   Verified address: [address]   [place] [address]   Medication will be filled on 01/16/24.

## 2024-01-15 ENCOUNTER — Encounter: Payer: Self-pay | Admitting: Oncology

## 2024-01-15 ENCOUNTER — Encounter: Payer: Self-pay | Admitting: Hematology and Oncology

## 2024-01-15 ENCOUNTER — Other Ambulatory Visit (HOSPITAL_COMMUNITY): Payer: Self-pay

## 2024-01-15 ENCOUNTER — Other Ambulatory Visit: Payer: Self-pay

## 2024-01-15 MED ORDER — ABEMACICLIB 100 MG PO TABS
100.0000 mg | ORAL_TABLET | Freq: Two times a day (BID) | ORAL | 0 refills | Status: DC
  Filled 2024-01-15: qty 56, 28d supply, fill #0

## 2024-01-16 ENCOUNTER — Other Ambulatory Visit: Payer: Self-pay

## 2024-02-05 ENCOUNTER — Other Ambulatory Visit: Payer: Self-pay

## 2024-02-05 DIAGNOSIS — C50411 Malignant neoplasm of upper-outer quadrant of right female breast: Secondary | ICD-10-CM

## 2024-02-09 ENCOUNTER — Encounter: Payer: Self-pay | Admitting: Oncology

## 2024-02-09 ENCOUNTER — Inpatient Hospital Stay: Payer: Medicare PPO | Attending: Oncology

## 2024-02-09 ENCOUNTER — Other Ambulatory Visit: Payer: Self-pay

## 2024-02-09 ENCOUNTER — Inpatient Hospital Stay: Payer: Medicare PPO

## 2024-02-09 ENCOUNTER — Inpatient Hospital Stay: Payer: Medicare PPO | Admitting: Oncology

## 2024-02-09 VITALS — BP 118/79 | HR 88 | Temp 97.6°F | Wt 150.0 lb

## 2024-02-09 DIAGNOSIS — Z79899 Other long term (current) drug therapy: Secondary | ICD-10-CM | POA: Diagnosis not present

## 2024-02-09 DIAGNOSIS — Z87891 Personal history of nicotine dependence: Secondary | ICD-10-CM | POA: Insufficient documentation

## 2024-02-09 DIAGNOSIS — Z79818 Long term (current) use of other agents affecting estrogen receptors and estrogen levels: Secondary | ICD-10-CM

## 2024-02-09 DIAGNOSIS — Z8614 Personal history of Methicillin resistant Staphylococcus aureus infection: Secondary | ICD-10-CM | POA: Insufficient documentation

## 2024-02-09 DIAGNOSIS — C50411 Malignant neoplasm of upper-outer quadrant of right female breast: Secondary | ICD-10-CM

## 2024-02-09 DIAGNOSIS — C7951 Secondary malignant neoplasm of bone: Secondary | ICD-10-CM | POA: Diagnosis not present

## 2024-02-09 DIAGNOSIS — Z85828 Personal history of other malignant neoplasm of skin: Secondary | ICD-10-CM | POA: Diagnosis not present

## 2024-02-09 DIAGNOSIS — Z9221 Personal history of antineoplastic chemotherapy: Secondary | ICD-10-CM | POA: Insufficient documentation

## 2024-02-09 DIAGNOSIS — Z801 Family history of malignant neoplasm of trachea, bronchus and lung: Secondary | ICD-10-CM | POA: Diagnosis not present

## 2024-02-09 DIAGNOSIS — D701 Agranulocytosis secondary to cancer chemotherapy: Secondary | ICD-10-CM

## 2024-02-09 DIAGNOSIS — C50911 Malignant neoplasm of unspecified site of right female breast: Secondary | ICD-10-CM

## 2024-02-09 DIAGNOSIS — Z923 Personal history of irradiation: Secondary | ICD-10-CM | POA: Insufficient documentation

## 2024-02-09 DIAGNOSIS — T451X5A Adverse effect of antineoplastic and immunosuppressive drugs, initial encounter: Secondary | ICD-10-CM

## 2024-02-09 LAB — CMP (CANCER CENTER ONLY)
ALT: 19 U/L (ref 0–44)
AST: 29 U/L (ref 15–41)
Albumin: 4 g/dL (ref 3.5–5.0)
Alkaline Phosphatase: 50 U/L (ref 38–126)
Anion gap: 8 (ref 5–15)
BUN: 15 mg/dL (ref 8–23)
CO2: 23 mmol/L (ref 22–32)
Calcium: 8.8 mg/dL — ABNORMAL LOW (ref 8.9–10.3)
Chloride: 104 mmol/L (ref 98–111)
Creatinine: 1.26 mg/dL — ABNORMAL HIGH (ref 0.44–1.00)
GFR, Estimated: 47 mL/min — ABNORMAL LOW (ref >60)
Glucose, Bld: 101 mg/dL — ABNORMAL HIGH (ref 70–99)
Potassium: 4.1 mmol/L (ref 3.5–5.1)
Sodium: 135 mmol/L (ref 135–145)
Total Bilirubin: 0.5 mg/dL (ref 0.0–1.2)
Total Protein: 7.3 g/dL (ref 6.5–8.1)

## 2024-02-09 LAB — CBC WITH DIFFERENTIAL (CANCER CENTER ONLY)
Abs Immature Granulocytes: 0.01 10*3/uL (ref 0.00–0.07)
Basophils Absolute: 0.1 10*3/uL (ref 0.0–0.1)
Basophils Relative: 2 %
Eosinophils Absolute: 0.1 10*3/uL (ref 0.0–0.5)
Eosinophils Relative: 2 %
HCT: 38.2 % (ref 36.0–46.0)
Hemoglobin: 13.2 g/dL (ref 12.0–15.0)
Immature Granulocytes: 0 %
Lymphocytes Relative: 31 %
Lymphs Abs: 0.9 10*3/uL (ref 0.7–4.0)
MCH: 34.6 pg — ABNORMAL HIGH (ref 26.0–34.0)
MCHC: 34.6 g/dL (ref 30.0–36.0)
MCV: 100.3 fL — ABNORMAL HIGH (ref 80.0–100.0)
Monocytes Absolute: 0.3 10*3/uL (ref 0.1–1.0)
Monocytes Relative: 12 %
Neutro Abs: 1.5 10*3/uL — ABNORMAL LOW (ref 1.7–7.7)
Neutrophils Relative %: 53 %
Platelet Count: 164 10*3/uL (ref 150–400)
RBC: 3.81 MIL/uL — ABNORMAL LOW (ref 3.87–5.11)
RDW: 12.2 % (ref 11.5–15.5)
WBC Count: 2.9 10*3/uL — ABNORMAL LOW (ref 4.0–10.5)
nRBC: 0 % (ref 0.0–0.2)

## 2024-02-09 MED ORDER — ZOLEDRONIC ACID 4 MG/100ML IV SOLN
4.0000 mg | INTRAVENOUS | Status: DC
  Administered 2024-02-09: 4 mg via INTRAVENOUS
  Filled 2024-02-09: qty 100

## 2024-02-09 MED ORDER — SODIUM CHLORIDE 0.9 % IV SOLN
Freq: Once | INTRAVENOUS | Status: AC
Start: 2024-02-09 — End: 2024-02-09
  Filled 2024-02-09: qty 250

## 2024-02-09 MED ORDER — FULVESTRANT 250 MG/5ML IM SOSY
500.0000 mg | PREFILLED_SYRINGE | Freq: Once | INTRAMUSCULAR | Status: AC
  Administered 2024-02-09: 500 mg via INTRAMUSCULAR
  Filled 2024-02-09: qty 10

## 2024-02-09 NOTE — Progress Notes (Signed)
 Hematology/Oncology Consult note [facility]  Telephone:([phone] Fax:([phone]) [phone]  Patient Care Team: Allegra Grana, FNP as PCP - General (Family Medicine) Jim Like, RN as Oncology Nurse Navigator Byrnett, Merrily Pew, MD (General Surgery) Glori Luis, MD (Inactive) as Consulting Physician (Family Medicine) Lonell Face, MD as Consulting Physician (Neurology) Creig Hines, MD as Consulting Physician (Oncology) Carmina Miller, MD as Consulting Physician (Radiation Oncology)   Name of the patient: Megan Vega  [id number]  Aug 18, 1956   Date of visit: 02/09/24  Diagnosis- history of ER/PR positive HER2 negative breast cancer now with isolated L5 bone metastases stage III right breast cancer and left breast DCIS in 2019   Chief complaint/ Reason for visit-routine follow-up of metastatic ER positive breast cancer with isolated L5 metastases presently on Faslodex plus Verzenio  Heme/Onc history: Kandas Oliveto is a 68 y.o. female with clinical stage [id number] right breast cancer and DCIS in the left breast s/p neoadjuvant chemotherapy followed by left simple mastectomy and right modified radical mastectomy on 07/10/2018.  Biopsy showed 1.4 cm grade 2 invasive mammary carcinoma ER greater than 90% positive PR greater than 90% positive and HER2 negative +1   Patient received neoadjuvant AC Taxol chemotherapy until July 2019.   Left breast pathology revealed 2.8 cm residual high grade DCIS, comedo type.  There was atypical lobular hyperplasia.  Four sentinel lymph nodes were negative.  Right breast pathology revealed 3.6 cm residual grade II invasive mammary carcinoma and high grade DCIS.  There was lymphovascular invasion.There was metastatic carcinoma in 2 sentinel lymph nodes.  Right axillary dissection revealed metastatic carcinoma in 7 lymph nodes.  There were a total of 9 lymph nodes with macrometastasis (largest 22 mm).  Pathologic  stage was ypT2 ypN2a.   Patient then received adjuvant radiation treatment and started letrozole in February 2020   She underwent right breast reconstruction with latissimus myocutaneous flap and removal of her port-a-cath on 02/07/2020.  Expanders were taken out in 05/2020 secondary to infection.  She underwent excision of left breast wound with closure on 10/02/2020. Pathology revealed inflamed granulation tissue and fibrosis. There was no evidence of malignancy.  She received adjuvant Zometa for 3 years   Patient found to have L5 vertebral lesion that was hypermetabolic on PET scan.  Biopsies negative for malignancy.  There was no lesions seen elsewhere.  A repeat scans in May 2024 again confirmed the presence of L5 vertebral lesion and patient had a second biopsy with osteo-cool procedure/kyphoplasty.Biopsy was consistent with metastatic adenocarcinoma compatible with breast primary.  ER 100% positive, PR 5% positive and HER2 negative.  Not enough tissue available for NGS testing.  Patient started on Faslodex plus Verzenio in July 2024  Interval history-after dose reducing Verzenio patient was tolerating it well up until a couple weeks ago when she started developing losing more GI side effects including nausea and diarrhea.  She still feels that the side effects are manageable.  Denies any fever  ECOG PS- 1 Pain scale- 0   Review of systems- Review of Systems  Constitutional:  Positive for malaise/fatigue. Negative for chills, fever and weight loss.  HENT:  Negative for congestion, ear discharge and nosebleeds.   Eyes:  Negative for blurred vision.  Respiratory:  Negative for cough, hemoptysis, sputum production, shortness of breath and wheezing.   Cardiovascular:  Negative for chest pain, palpitations, orthopnea and claudication.  Gastrointestinal:  Positive for diarrhea and nausea. Negative for abdominal pain, blood in  stool, constipation, heartburn, melena and vomiting.  Genitourinary:   Negative for dysuria, flank pain, frequency, hematuria and urgency.  Musculoskeletal:  Negative for back pain, joint pain and myalgias.  Skin:  Negative for rash.  Neurological:  Negative for dizziness, tingling, focal weakness, seizures, weakness and headaches.  Endo/Heme/Allergies:  Does not bruise/bleed easily.  Psychiatric/Behavioral:  Negative for depression and suicidal ideas. The patient does not have insomnia.       Allergies  Allergen Reactions   Peanut-Containing Drug Products Swelling and Other (See Comments)    Only when eating too many peanuts, swelling with lips and tingly face     Past Medical History:  Diagnosis Date   Ankle fracture    Breast cancer (HCC)    bilateral   Cancer (HCC)    Basal Cell Carcinoma, about 2011 chest   Cataract    Family history of adverse reaction to anesthesia    daughter with PONV   Hemorrhoids    History of methicillin resistant staphylococcus aureus (MRSA) 2015   Migraines    MIGRAINES occ   Personal history of chemotherapy    Personal history of radiation therapy    Wears glasses      Past Surgical History:  Procedure Laterality Date   BREAST BIOPSY Bilateral 10/29/2017   L breast DCIS, R breast invasive mammary carcinoma of no special type, ER/PR+, Her2/neu 1+   BREAST RECONSTRUCTION Right 02/07/2020   Procedure: BREAST RECONSTRUCTION;  Surgeon: Peggye Form, DO;  Location: [facility];  Service: Plastics;  Laterality: Right;   BREAST RECONSTRUCTION WITH PLACEMENT OF TISSUE EXPANDER AND FLEX HD (ACELLULAR HYDRATED DERMIS) Bilateral 08/30/2019   Procedure: BREAST RECONSTRUCTION WITH PLACEMENT OF TISSUE EXPANDER AND FLEX HD (ACELLULAR HYDRATED DERMIS);  Surgeon: Peggye Form, DO;  Location: [facility];  Service: Plastics;  Laterality: Bilateral;  2.5 hours, please   CESAREAN SECTION     COLONOSCOPY  2012   COLONOSCOPY WITH PROPOFOL N/A 12/01/2018   Procedure: COLONOSCOPY WITH PROPOFOL;  Surgeon: Midge Minium, MD;   Location: [facility];  Service: Endoscopy;  Laterality: N/A;   DEBRIDEMENT AND CLOSURE WOUND Bilateral 10/02/2020   Procedure: Excision of bilateral breast wound;  Surgeon: Peggye Form, DO;  Location: [facility];  Service: Plastics;  Laterality: Bilateral;  1.5 hours, please   FRACTURE SURGERY     ankle   IR CT BONE TROCAR/NEEDLE BIOPSY DEEP  12/26/2022   IR KYPHO LUMBAR INC FX REDUCE BONE BX UNI/BIL CANNULATION INC/IMAGING  05/14/2023   IR RADIOLOGIST EVAL & MGMT  04/28/2023   IR RADIOLOGIST EVAL & MGMT  06/27/2023   IRRIGATION AND DEBRIDEMENT ABSCESS Right 03/12/2018   Procedure: IRRIGATION AND DEBRIDEMENT RIGHT AXILLARY ABSCESS;  Surgeon: Earline Mayotte, MD;  Location: [facility];  Service: General;  Laterality: Right;   LATISSIMUS FLAP TO BREAST Right 02/07/2020   Procedure: LATISSIMUS MYOCUTANEOUS FLAP TO BREAST;  Surgeon: Peggye Form, DO;  Location: [facility];  Service: Plastics;  Laterality: Right;  3 hours   MANDIBLE FRACTURE SURGERY     MASTECTOMY     MASTECTOMY MODIFIED RADICAL Right 07/10/2018   ypT2 ypN2a ER/PR positive, HER-2/neu negative  Surgeon: Earline Mayotte, MD;  Location: [facility];  Service: General;  Laterality: Right;   PORT-A-CATH REMOVAL Left 02/07/2020   Procedure: REMOVAL PORT-A-CATH;  Surgeon: Peggye Form, DO;  Location: [facility];  Service: Plastics;  Laterality: Left;   PORTACATH PLACEMENT Left 11/28/2017   Procedure: INSERTION PORT-A-CATH;  Surgeon: Lemar Livings,  Merrily Pew, MD;  Location: [facility];  Service: General;  Laterality: Left;   REMOVAL OF TISSUE EXPANDER Right 06/05/2020   Procedure: TISSUE EXPANDER REMOVAL;  Surgeon: Peggye Form, DO;  Location: [facility];  Service: Plastics;  Laterality: Right;   SIMPLE MASTECTOMY WITH AXILLARY SENTINEL NODE BIOPSY Left 07/10/2018   High-grade DCIS SIMPLE MASTECTOMY;  Surgeon: Earline Mayotte, MD;  Location: [facility];  Service: General;  Laterality: Left;   TISSUE EXPANDER  PLACEMENT Left 06/05/2020   Procedure: TISSUE EXPANDER FLUID REMOVAL-100ML;  Surgeon: Peggye Form, DO;  Location: [facility];  Service: Plastics;  Laterality: Left;   TISSUE EXPANDER PLACEMENT Left 06/14/2020   Procedure: TISSUE EXPANDER REMOVAL;  Surgeon: Peggye Form, DO;  Location: [facility];  Service: Plastics;  Laterality: Left;  45 min, please    Social History   Socioeconomic History   Marital status: Divorced    Spouse name: Not on file   Number of children: 2   Years of education: Not on file   Highest education level: Not on file  Occupational History   Occupation: Retired  Tobacco Use   Smoking status: Former    Current packs/day: 0.00    Average packs/day: 0.3 packs/day for 10.0 years (2.5 ttl pk-yrs)    Types: Cigarettes    Start date: 73    Quit date: 1978    Years since quitting: 47.2   Smokeless tobacco: Never  Vaping Use   Vaping status: Never Used  Substance and Sexual Activity   Alcohol use: Yes    Alcohol/week: 1.0 standard drink of alcohol    Types: 1 Glasses of wine per week    Comment: " occasional glass of wine "   Drug use: No   Sexual activity: Not Currently    Birth control/protection: Post-menopausal  Other Topics Concern   Not on file  Social History Narrative   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    Lives at home (son 59 yo lives with her)   Children 2   Pets: 3 small dogs and 1 frog    Caffeine- 1 cup of coffee in the morning, rare tea   Social Drivers of Corporate investment banker Strain: Low Risk  (04/02/2023)   Overall Financial Resource Strain (CARDIA)    Difficulty of Paying Living Expenses: Not hard at all  Food Insecurity: No Food Insecurity (04/02/2023)   Hunger Vital Sign    Worried About Running Out of Food in the Last Year: Never true    Ran Out of Food in the Last Year: Never true  Transportation Needs: No Transportation Needs (04/02/2023)   PRAPARE - Administrator, Civil Service (Medical): No     Lack of Transportation (Non-Medical): No  Physical Activity: Sufficiently Active (04/02/2023)   Exercise Vital Sign    Days of Exercise per Week: 7 days    Minutes of Exercise per Session: 30 min  Stress: No Stress Concern Present (04/02/2023)   [employer] of Occupational Health - Occupational Stress Questionnaire    Feeling of Stress : Not at all  Social Connections: Moderately Isolated (04/02/2023)   Social Connection and Isolation Panel [NHANES]    Frequency of Communication with Friends and Family: More than three times a week    Frequency of Social Gatherings with Friends and Family: Once a week    Attends Religious Services: More than 4 times per year    Active Member of Clubs or Organizations: No  Attends Banker Meetings: Never    Marital Status: Divorced  Catering manager Violence: Not At Risk (04/02/2023)   Humiliation, Afraid, Rape, and Kick questionnaire    Fear of Current or Ex-Partner: No    Emotionally Abused: No    Physically Abused: No    Sexually Abused: No    Family History  Problem Relation Age of Onset   Cancer Mother        Esophageal Cancer   Early death Mother        Age 1   Cancer Father        Lung Cancer   Diabetes Father    Diabetes Brother        Controlled with diet   Heart attack Paternal Grandfather    Colon cancer Neg Hx      Current Outpatient Medications:    abemaciclib (VERZENIO) 100 MG tablet, Take 1 tablet (100 mg total) by mouth 2 (two) times daily., Disp: 56 tablet, Rfl: 0   b complex vitamins tablet, Take 1 tablet by mouth daily., Disp: , Rfl:    Calcium-Magnesium-Vitamin D (CALCIUM 1200+D3 PO), Take 1,200 mg by mouth daily. , Disp: , Rfl:    diphenoxylate-atropine (LOMOTIL) 2.5-0.025 MG tablet, Take 2 tablets by mouth 4 (four) times daily as needed for diarrhea or loose stools., Disp: 30 tablet, Rfl: 1   loperamide (IMODIUM) 2 MG capsule, Take 2 tabs by mouth with first loose stool, then 1 tab with each  additional loose stool as needed. Do not exceed 8 tabs in a 24-hour period, Disp: 60 capsule, Rfl: 2   loratadine (CLARITIN) 10 MG tablet, Take 10 mg by mouth every morning. , Disp: , Rfl:    Multiple Vitamin (MULTIVITAMIN WITH MINERALS) TABS tablet, Take 1 tablet by mouth daily. Centrum Silver, Disp: , Rfl:    Naphazoline HCl (CLEAR EYES OP), Place 1 drop into both eyes daily., Disp: , Rfl:    ondansetron (ZOFRAN) 8 MG tablet, Take 1 tablet (8 mg total) by mouth every 8 (eight) hours as needed for nausea or vomiting., Disp: 15 tablet, Rfl: 0   Probiotic Product (PROBIOTIC PO), Take 1 capsule by mouth daily., Disp: , Rfl:    prochlorperazine (COMPAZINE) 10 MG tablet, Take 1 tablet (10 mg total) by mouth every 6 (six) hours as needed for nausea or vomiting., Disp: 30 tablet, Rfl: 1   traMADol (ULTRAM) 50 MG tablet, Take 1 tablet (50 mg total) by mouth every 6 (six) hours as needed for moderate pain., Disp: 10 tablet, Rfl: 0   Melatonin 5 MG CAPS, Take 5 mg by mouth daily as needed (sleep).  (Patient not taking: Reported on 08/13/2023), Disp: , Rfl:  No current facility-administered medications for this visit.  Facility-Administered Medications Ordered in Other Visits:    heparin lock flush 100 unit/mL, 500 Units, Intracatheter, PRN, Corcoran, Melissa C, MD   Zoledronic Acid (ZOMETA) IVPB 4 mg, 4 mg, Intravenous, Q90 days, Creig Hines, MD, Stopped at 02/09/24 1124  Physical exam:  Vitals:   02/09/24 0949  BP: 118/79  Pulse: 88  Temp: 97.6 F (36.4 C)  TempSrc: Tympanic  SpO2: 100%  Weight: 150 lb (68 kg)   Physical Exam Cardiovascular:     Rate and Rhythm: Normal rate and regular rhythm.     Heart sounds: Normal heart sounds.  Pulmonary:     Effort: Pulmonary effort is normal.     Breath sounds: Normal breath sounds.  Skin:    General: Skin  is warm and dry.  Neurological:     Mental Status: She is alert and oriented to person, place, and time.         Latest Ref Rng & Units  02/09/2024    9:36 AM  CMP  Glucose 70 - 99 mg/dL 454   BUN 8 - 23 mg/dL 15   Creatinine 0.98 - 1.00 mg/dL 1.19   Sodium 147 - 829 mmol/L 135   Potassium 3.5 - 5.1 mmol/L 4.1   Chloride 98 - 111 mmol/L 104   CO2 22 - 32 mmol/L 23   Calcium 8.9 - 10.3 mg/dL 8.8   Total Protein 6.5 - 8.1 g/dL 7.3   Total Bilirubin 0.0 - 1.2 mg/dL 0.5   Alkaline Phos 38 - 126 U/L 50   AST 15 - 41 U/L 29   ALT 0 - 44 U/L 19       Latest Ref Rng & Units 02/09/2024    9:36 AM  CBC  WBC 4.0 - 10.5 K/uL 2.9   Hemoglobin 12.0 - 15.0 g/dL 56.2   Hematocrit 13.0 - 46.0 % 38.2   Platelets 150 - 400 K/uL 164      Assessment and plan- Patient is a 68 y.o. female with history of metastatic ER/PR positive HER2 negative breast cancer with isolated L5 metastases.  She is presently on Verzenio plus Faslodex and this is a routine follow-up visit  White cell count is 2.9 today but ANC remains more than 1.  Continue with Verzenio 100 mg twice a day along with monthly Faslodex until progression or toxicity.  Patient is due for Zometa today which she is getting every 6 months.  Monthly labs and Faslodex and I will see her back in 3 months with CT chest abdomen and pelvis with contrast prior.  Tumor marker CA 27-29 and CA 15-3 to be checked in 1 month   Visit Diagnosis 1. Primary cancer of upper outer quadrant of right breast (HCC)   2. High risk medication use   3. Use of fulvestrant (Faslodex)      Dr. Owens Shark, MD, MPH [facility] at [facility] [phone] 02/09/2024 12:48 PM

## 2024-02-09 NOTE — Progress Notes (Signed)
 Creatinine 1.26 and Calcium 8.8. Per Dr. Smith Robert okay to proceed with Zometa.  Per Dr. Smith Robert pt to receive Zometa and Faslodex at this time.

## 2024-02-10 ENCOUNTER — Other Ambulatory Visit (HOSPITAL_COMMUNITY): Payer: Self-pay

## 2024-02-10 ENCOUNTER — Other Ambulatory Visit: Payer: Self-pay | Admitting: Oncology

## 2024-02-10 DIAGNOSIS — C50411 Malignant neoplasm of upper-outer quadrant of right female breast: Secondary | ICD-10-CM

## 2024-02-10 NOTE — Progress Notes (Signed)
 Specialty Pharmacy Refill Coordination Note  Megan Vega is a 68 y.o. female contacted today regarding refills of specialty medication(s) Abemaciclib [name]   Patient requested (Patient-Rptd) Delivery   Delivery date: (Patient-Rptd) 02/16/24   Verified address: (Patient-Rptd) [address] [place], [place] [address]   Medication will be filled on 02/13/24. This fill date is pending response to refill request from provider. Patient is aware and if they have not received fill by intended date they must follow up with pharmacy.

## 2024-02-11 ENCOUNTER — Other Ambulatory Visit: Payer: Self-pay

## 2024-02-11 MED ORDER — ABEMACICLIB 100 MG PO TABS
100.0000 mg | ORAL_TABLET | Freq: Two times a day (BID) | ORAL | 0 refills | Status: DC
  Filled 2024-02-11: qty 56, 28d supply, fill #0

## 2024-02-13 ENCOUNTER — Other Ambulatory Visit (HOSPITAL_COMMUNITY): Payer: Self-pay

## 2024-03-04 ENCOUNTER — Other Ambulatory Visit: Payer: Self-pay | Admitting: Oncology

## 2024-03-04 ENCOUNTER — Other Ambulatory Visit (HOSPITAL_COMMUNITY): Payer: Self-pay

## 2024-03-04 ENCOUNTER — Other Ambulatory Visit: Payer: Self-pay

## 2024-03-04 DIAGNOSIS — C50411 Malignant neoplasm of upper-outer quadrant of right female breast: Secondary | ICD-10-CM

## 2024-03-04 MED ORDER — ABEMACICLIB 100 MG PO TABS
100.0000 mg | ORAL_TABLET | Freq: Two times a day (BID) | ORAL | 0 refills | Status: DC
  Filled 2024-03-04: qty 56, 28d supply, fill #0

## 2024-03-04 NOTE — Progress Notes (Signed)
 Specialty Pharmacy Refill Coordination Note  Megan Vega is a 68 y.o. female contacted today regarding refills of specialty medication(s) Verzenio.  Patient requested (Patient-Rptd) Delivery   Delivery date: (Patient-Rptd) 03/15/24   Verified address: (Patient-Rptd) [address] [place], [place] [address]   Medication will be filled on 03/12/24.   This fill date is pending response to refill request from provider. Patient is aware and if they have not received fill by intended date, they must follow up with pharmacy.

## 2024-03-08 ENCOUNTER — Other Ambulatory Visit: Payer: Self-pay

## 2024-03-08 DIAGNOSIS — C50411 Malignant neoplasm of upper-outer quadrant of right female breast: Secondary | ICD-10-CM

## 2024-03-09 ENCOUNTER — Encounter: Payer: Self-pay | Admitting: Oncology

## 2024-03-09 ENCOUNTER — Inpatient Hospital Stay: Attending: Oncology

## 2024-03-09 ENCOUNTER — Inpatient Hospital Stay

## 2024-03-09 DIAGNOSIS — C50411 Malignant neoplasm of upper-outer quadrant of right female breast: Secondary | ICD-10-CM | POA: Diagnosis not present

## 2024-03-09 DIAGNOSIS — Z170 Estrogen receptor positive status [ER+]: Secondary | ICD-10-CM | POA: Diagnosis not present

## 2024-03-09 DIAGNOSIS — C7951 Secondary malignant neoplasm of bone: Secondary | ICD-10-CM | POA: Insufficient documentation

## 2024-03-09 DIAGNOSIS — Z5111 Encounter for antineoplastic chemotherapy: Secondary | ICD-10-CM | POA: Diagnosis not present

## 2024-03-09 LAB — CBC WITH DIFFERENTIAL (CANCER CENTER ONLY)
Abs Immature Granulocytes: 0.01 10*3/uL (ref 0.00–0.07)
Basophils Absolute: 0.1 10*3/uL (ref 0.0–0.1)
Basophils Relative: 3 %
Eosinophils Absolute: 0.1 10*3/uL (ref 0.0–0.5)
Eosinophils Relative: 4 %
HCT: 38.2 % (ref 36.0–46.0)
Hemoglobin: 13.2 g/dL (ref 12.0–15.0)
Immature Granulocytes: 0 %
Lymphocytes Relative: 29 %
Lymphs Abs: 0.8 10*3/uL (ref 0.7–4.0)
MCH: 34.5 pg — ABNORMAL HIGH (ref 26.0–34.0)
MCHC: 34.6 g/dL (ref 30.0–36.0)
MCV: 99.7 fL (ref 80.0–100.0)
Monocytes Absolute: 0.3 10*3/uL (ref 0.1–1.0)
Monocytes Relative: 12 %
Neutro Abs: 1.4 10*3/uL — ABNORMAL LOW (ref 1.7–7.7)
Neutrophils Relative %: 52 %
Platelet Count: 173 10*3/uL (ref 150–400)
RBC: 3.83 MIL/uL — ABNORMAL LOW (ref 3.87–5.11)
RDW: 12.3 % (ref 11.5–15.5)
WBC Count: 2.6 10*3/uL — ABNORMAL LOW (ref 4.0–10.5)
nRBC: 0 % (ref 0.0–0.2)

## 2024-03-09 LAB — CMP (CANCER CENTER ONLY)
ALT: 21 U/L (ref 0–44)
AST: 30 U/L (ref 15–41)
Albumin: 4 g/dL (ref 3.5–5.0)
Alkaline Phosphatase: 53 U/L (ref 38–126)
Anion gap: 9 (ref 5–15)
BUN: 14 mg/dL (ref 8–23)
CO2: 24 mmol/L (ref 22–32)
Calcium: 8.7 mg/dL — ABNORMAL LOW (ref 8.9–10.3)
Chloride: 102 mmol/L (ref 98–111)
Creatinine: 1.06 mg/dL — ABNORMAL HIGH (ref 0.44–1.00)
GFR, Estimated: 58 mL/min — ABNORMAL LOW (ref >60)
Glucose, Bld: 96 mg/dL (ref 70–99)
Potassium: 3.9 mmol/L (ref 3.5–5.1)
Sodium: 135 mmol/L (ref 135–145)
Total Bilirubin: 1.1 mg/dL (ref 0.0–1.2)
Total Protein: 7 g/dL (ref 6.5–8.1)

## 2024-03-09 MED ORDER — FULVESTRANT 250 MG/5ML IM SOSY
500.0000 mg | PREFILLED_SYRINGE | Freq: Once | INTRAMUSCULAR | Status: AC
Start: 2024-03-09 — End: 2024-03-09
  Administered 2024-03-09: 500 mg via INTRAMUSCULAR
  Filled 2024-03-09: qty 10

## 2024-03-10 LAB — CANCER ANTIGEN 15-3: CA 15-3: 15.7 U/mL (ref 0.0–25.0)

## 2024-03-11 LAB — CANCER ANTIGEN 27.29: CA 27.29: 16.5 U/mL (ref 0.0–38.6)

## 2024-03-12 ENCOUNTER — Other Ambulatory Visit: Payer: Self-pay

## 2024-03-27 ENCOUNTER — Other Ambulatory Visit: Payer: Self-pay | Admitting: Oncology

## 2024-03-27 DIAGNOSIS — C50411 Malignant neoplasm of upper-outer quadrant of right female breast: Secondary | ICD-10-CM

## 2024-03-29 ENCOUNTER — Encounter: Payer: Self-pay | Admitting: Oncology

## 2024-03-29 ENCOUNTER — Encounter: Payer: Self-pay | Admitting: Hematology and Oncology

## 2024-04-05 ENCOUNTER — Other Ambulatory Visit: Payer: Self-pay | Admitting: Oncology

## 2024-04-05 ENCOUNTER — Other Ambulatory Visit: Payer: Self-pay | Admitting: Pharmacy Technician

## 2024-04-05 ENCOUNTER — Other Ambulatory Visit: Payer: Self-pay

## 2024-04-05 DIAGNOSIS — C50411 Malignant neoplasm of upper-outer quadrant of right female breast: Secondary | ICD-10-CM

## 2024-04-05 NOTE — Progress Notes (Signed)
 Specialty Pharmacy Refill Coordination Note  Megan Vega is a 68 y.o. female contacted today regarding refills of specialty medication(s) Abemaciclib  (VERZENIO )   Patient requested Delivery   Delivery date: 04/09/24 (Closed on 04/12/24)   [place] [place] [address]   Medication will be filled on 04/08/24.   This fill date is pending response to refill request from provider. Left Patient voicemail if they have not received fill by intended date they must follow up with pharmacy.    New delivery date 04/09/24.

## 2024-04-06 ENCOUNTER — Other Ambulatory Visit: Payer: Self-pay

## 2024-04-06 DIAGNOSIS — C50411 Malignant neoplasm of upper-outer quadrant of right female breast: Secondary | ICD-10-CM

## 2024-04-06 MED ORDER — ABEMACICLIB 100 MG PO TABS
100.0000 mg | ORAL_TABLET | Freq: Two times a day (BID) | ORAL | 0 refills | Status: DC
  Filled 2024-04-06: qty 56, 28d supply, fill #0

## 2024-04-07 ENCOUNTER — Ambulatory Visit (INDEPENDENT_AMBULATORY_CARE_PROVIDER_SITE_OTHER): Admitting: Family

## 2024-04-07 ENCOUNTER — Encounter: Payer: Self-pay | Admitting: Family

## 2024-04-07 ENCOUNTER — Ambulatory Visit (INDEPENDENT_AMBULATORY_CARE_PROVIDER_SITE_OTHER): Admitting: *Deleted

## 2024-04-07 VITALS — Ht 65.0 in | Wt 150.0 lb

## 2024-04-07 VITALS — BP 118/70 | HR 84 | Temp 98.1°F | Ht 65.0 in | Wt 150.8 lb

## 2024-04-07 DIAGNOSIS — M85852 Other specified disorders of bone density and structure, left thigh: Secondary | ICD-10-CM

## 2024-04-07 DIAGNOSIS — Z0000 Encounter for general adult medical examination without abnormal findings: Secondary | ICD-10-CM

## 2024-04-07 DIAGNOSIS — Z13220 Encounter for screening for lipoid disorders: Secondary | ICD-10-CM

## 2024-04-07 DIAGNOSIS — R7303 Prediabetes: Secondary | ICD-10-CM

## 2024-04-07 DIAGNOSIS — Z1211 Encounter for screening for malignant neoplasm of colon: Secondary | ICD-10-CM

## 2024-04-07 DIAGNOSIS — Z23 Encounter for immunization: Secondary | ICD-10-CM | POA: Diagnosis not present

## 2024-04-07 DIAGNOSIS — Z136 Encounter for screening for cardiovascular disorders: Secondary | ICD-10-CM

## 2024-04-07 LAB — VITAMIN D 25 HYDROXY (VIT D DEFICIENCY, FRACTURES): VITD: 32.66 ng/mL (ref 30.00–100.00)

## 2024-04-07 LAB — LIPID PANEL
Cholesterol: 167 mg/dL (ref 0–200)
HDL: 51.2 mg/dL (ref >39.00)
LDL Cholesterol: 82 mg/dL (ref 0–99)
NonHDL: 115.77
Total CHOL/HDL Ratio: 3
Triglycerides: 171 mg/dL — ABNORMAL HIGH (ref 0.0–149.0)
VLDL: 34.2 mg/dL (ref 0.0–40.0)

## 2024-04-07 LAB — HEMOGLOBIN A1C: Hgb A1c MFr Bld: 5 % (ref 4.6–6.5)

## 2024-04-07 LAB — TSH: TSH: 1.65 u[IU]/mL (ref 0.35–5.50)

## 2024-04-07 NOTE — Patient Instructions (Signed)
 Megan Vega , Thank you for taking time out of your busy schedule to complete your Annual Wellness Visit with me. I enjoyed our conversation and look forward to speaking with you again next year. I, as well as your care team,  appreciate your ongoing commitment to your health goals. Please review the following plan we discussed and let me know if I can assist you in the future. Your Game plan/ To Do List    Referrals: If you haven't heard from the office you've been referred to, please reach out to them at the phone provided.  Remember to update your shingles vaccines. Follow up Visits: Next Medicare AWV with our clinical staff: 04/12/25 @ 8:50   Have you seen your provider in the last 6 months (3 months if uncontrolled diabetes)? Yes Next Office Visit with your provider: 04/19/25  Clinician Recommendations:  Aim for 30 minutes of exercise or brisk walking, 6-8 glasses of water, and 5 servings of fruits and vegetables each day.        This is a list of the screening recommended for you and due dates:  Health Maintenance  Topic Date Due   COVID-19 Vaccine (4 - 2024-25 season) 07/20/2023   Colon Cancer Screening  12/02/2023   Zoster (Shingles) Vaccine (1 of 2) 07/08/2024*   Flu Shot  06/18/2024   Medicare Annual Wellness Visit  04/07/2025   DTaP/Tdap/Td vaccine (3 - Td or Tdap) 09/08/2030   Pneumonia Vaccine  Completed   DEXA scan (bone density measurement)  Completed   Hepatitis C Screening  Completed   HPV Vaccine  Aged Out   Meningitis B Vaccine  Aged Out   Mammogram  Discontinued  *Topic was postponed. The date shown is not the original due date.    Advanced directives: (ACP Link)Information on Advanced Care Planning can be found at [place]  Secretary of [facility] Advance Health Care Directives Advance Health Care Directives. [email or web address]  Advance Care Planning is important because it:  [x]  Makes sure you receive the medical care that is consistent with your values, goals, and  preferences  [x]  It provides guidance to your family and loved ones and reduces their decisional burden about whether or not they are making the right decisions based on your wishes.  Follow the link provided in your after visit summary or read over the paperwork we have mailed to you to help you started getting your Advance Directives in place. If you need assistance in completing these, please reach out to us  so that we can help you!

## 2024-04-07 NOTE — Assessment & Plan Note (Addendum)
 Deferred clinical breast exam due to history double mastectomy.  Deferred pelvic exam as patient is no longer screening for cervical cancer. DEXA is up-to-date.  Encouraged continued exercise.  Colonoscopy is due and collaborated with Dr. Ole Berkeley via secure chat to confirm.

## 2024-04-07 NOTE — Progress Notes (Signed)
 Assessment & Plan:   Routine physical examination Assessment & Plan: Deferred clinical breast exam due to history double mastectomy.  Deferred pelvic exam as patient is no longer screening for cervical cancer. DEXA is up-to-date.  Encouraged continued exercise.  Colonoscopy is due and collaborated with Dr. Ole Berkeley via secure chat to confirm.  Orders: -     Hemoglobin A1c -     TSH -     Lipid panel -     VITAMIN D  25 Hydroxy (Vit-D Deficiency, Fractures)  Screening for colon cancer -     Ambulatory referral to Gastroenterology  Encounter for lipid screening for cardiovascular disease -     Lipid panel  Osteopenia of neck of left femur -     VITAMIN D  25 Hydroxy (Vit-D Deficiency, Fractures)  Prediabetes -     Hemoglobin A1c     Return precautions given.   Risks, benefits, and alternatives of the medications and treatment plan prescribed today were discussed, and patient expressed understanding.   Education regarding symptom management and diagnosis given to patient on AVS either electronically or printed.  No follow-ups on file.  Bascom Bossier, FNP  Subjective:    Patient ID: Megan Vega, female    DOB: 1956/06/23, 68 y.o.   MRN: [id number]  CC: Megan Vega is a 68 y.o. female who presents today for physical exam.    HPI: Feels well today.  No new concerns.  She is enjoying retirement.  She enjoys her 3 grandchildren and staying quite busy.Sleeping well   H/o BCC.She is following annually with [facility].   Following  with oncology, Dr. Randy Buttery.  Last seen 02/09/2024 follow-up right breast cancer.  Pending CT chest abdomen pelvis scheduled next month  Colorectal Cancer Screening: UTD , Dr Ole Berkeley, 12/01/2018, repeat in 5 years Breast Cancer Screening: Right mastectomy. Cervical Cancer Screening: No longer screening for cervical cancer.  Last Pap at 68 years of age obtained 09/10/2021 negative malignancy, negative HPV Bone Health screening/DEXA for 65+: UTD,  being 01/27/2023, osteopenia.  Ordered by Dr. Randy Buttery  Lung Cancer Screening: Doesn't have 20 year pack year history and age > 49 years yo 68 years         Tetanus - UTD        Pneumococcal - Candidate for.   Exercise: Gets regular exercise, walking 5000 steps every day.     Alcohol use:  occassional wine Smoking/tobacco use: former smoker.    Health Maintenance  Topic Date Due   COVID-19 Vaccine (4 - 2024-25 season) 07/20/2023   Colon Cancer Screening  12/02/2023   Medicare Annual Wellness Visit  04/01/2024   Zoster (Shingles) Vaccine (1 of 2) 07/08/2024*   Pneumonia Vaccine (1 of 2 - PCV) 04/07/2025*   Flu Shot  06/18/2024   DTaP/Tdap/Td vaccine (3 - Td or Tdap) 09/08/2030   DEXA scan (bone density measurement)  Completed   Hepatitis C Screening  Completed   HPV Vaccine  Aged Out   Meningitis B Vaccine  Aged Out   Mammogram  Discontinued  *Topic was postponed. The date shown is not the original due date.    ALLERGIES: Peanut-containing drug products  Current Outpatient Medications on File Prior to Visit  Medication Sig Dispense Refill   abemaciclib  (VERZENIO ) 100 MG tablet Take 1 tablet (100 mg total) by mouth 2 (two) times daily. 56 tablet 0   b complex vitamins tablet Take 1 tablet by mouth daily.     Calcium -Magnesium-Vitamin D  (CALCIUM  1200+D3 PO) Take  1,200 mg by mouth daily.      diphenoxylate -atropine  (LOMOTIL ) 2.5-0.025 MG tablet Take 2 tablets by mouth 4 (four) times daily as needed for diarrhea or loose stools. 30 tablet 1   loperamide  (IMODIUM ) 2 MG capsule Take 2 tabs by mouth with first loose stool, then 1 tab with each additional loose stool as needed. Do not exceed 8 tabs in a 24-hour period 60 capsule 2   loratadine (CLARITIN) 10 MG tablet Take 10 mg by mouth every morning.      Multiple Vitamin (MULTIVITAMIN WITH MINERALS) TABS tablet Take 1 tablet by mouth daily. Centrum Silver     Naphazoline HCl (CLEAR EYES OP) Place 1 drop into both eyes daily.      ondansetron  (ZOFRAN ) 8 MG tablet Take 1 tablet (8 mg total) by mouth every 8 (eight) hours as needed for nausea or vomiting. 15 tablet 0   Probiotic Product (PROBIOTIC PO) Take 1 capsule by mouth daily.     prochlorperazine  (COMPAZINE ) 10 MG tablet Take 1 tablet (10 mg total) by mouth every 6 (six) hours as needed for nausea or vomiting. 30 tablet 1   traMADol  (ULTRAM ) 50 MG tablet Take 1 tablet (50 mg total) by mouth every 6 (six) hours as needed for moderate pain. 10 tablet 0   Melatonin 5 MG CAPS Take 5 mg by mouth daily as needed (sleep).  (Patient not taking: Reported on 04/07/2024)     Current Facility-Administered Medications on File Prior to Visit  Medication Dose Route Frequency Provider Last Rate Last Admin   heparin  lock flush 100 unit/mL  500 Units Intracatheter PRN Corcoran, Melissa C, MD        Review of Systems  Constitutional:  Negative for chills, fever and unexpected weight change.  HENT:  Negative for congestion.   Respiratory:  Negative for cough.   Cardiovascular:  Negative for chest pain, palpitations and leg swelling.  Gastrointestinal:  Negative for nausea and vomiting.  Musculoskeletal:  Negative for arthralgias and myalgias.  Skin:  Negative for rash.  Neurological:  Negative for headaches.  Hematological:  Negative for adenopathy.  Psychiatric/Behavioral:  Negative for confusion.       Objective:    BP 118/70   Pulse 84   Temp 98.1 F (36.7 C) (Oral)   Ht 5\' 5"  (1.651 m)   Wt 150 lb 12.8 oz (68.4 kg)   LMP  (LMP Unknown)   SpO2 97%   BMI 25.09 kg/m   BP Readings from Last 3 Encounters:  04/07/24 118/70  02/09/24 118/79  11/03/23 107/69   Wt Readings from Last 3 Encounters:  04/07/24 150 lb 12.8 oz (68.4 kg)  02/09/24 150 lb (68 kg)  11/03/23 152 lb 9.6 oz (69.2 kg)    Physical Exam Vitals reviewed.  Constitutional:      Appearance: She is well-developed.  Eyes:     Conjunctiva/sclera: Conjunctivae normal.  Cardiovascular:     Rate and  Rhythm: Normal rate and regular rhythm.     Pulses: Normal pulses.     Heart sounds: Normal heart sounds.  Pulmonary:     Effort: Pulmonary effort is normal.     Breath sounds: Normal breath sounds. No wheezing, rhonchi or rales.  Skin:    General: Skin is warm and dry.  Neurological:     Mental Status: She is alert.  Psychiatric:        Speech: Speech normal.        Behavior: Behavior normal.  Thought Content: Thought content normal.

## 2024-04-07 NOTE — Progress Notes (Signed)
 Subjective:   Megan Vega is a 68 y.o. who presents for a Medicare Wellness preventive visit.  As a reminder, Annual Wellness Visits don't include a physical exam, and some assessments may be limited, especially if this visit is performed virtually. We may recommend an in-person follow-up visit with your provider if needed.  Visit Complete: Virtual I connected with  Megan Vega on 04/07/24 by a audio enabled telemedicine application and verified that I am speaking with the correct person using two identifiers.  Patient Location: Home  Provider Location: Home Office  I discussed the limitations of evaluation and management by telemedicine. The patient expressed understanding and agreed to proceed.  Vital Signs: Because this visit was a virtual/telehealth visit, some criteria may be missing or patient reported. Any vitals not documented were not able to be obtained and vitals that have been documented are patient reported.  VideoDeclined- This patient declined Librarian, academic. Therefore the visit was completed with audio only.  Persons Participating in Visit: Patient.  AWV Questionnaire: Yes: Patient Medicare AWV questionnaire was completed by the patient on 04/03/24; I have confirmed that all information answered by patient is correct and no changes since this date.  Cardiac Risk Factors include: advanced age (>23men, >24 women)     Objective:     Today's Vitals   04/07/24 1302  Weight: 150 lb (68 kg)  Height: 5\' 5"  (1.651 m)   Body mass index is 24.96 kg/m.     04/07/2024    1:17 PM 02/09/2024    9:44 AM 11/03/2023   10:13 AM 09/09/2023   10:34 AM 08/13/2023    9:32 AM 08/06/2023   10:05 AM 07/16/2023   10:16 AM  Advanced Directives  Does Patient Have a Medical Advance Directive? No No No No No No No  Does patient want to make changes to medical advance directive?   No - Patient declined No - Patient declined No - Patient declined No  - Patient declined   Would patient like information on creating a medical advance directive? No - Patient declined  No - Patient declined No - Patient declined No - Patient declined No - Patient declined     Current Medications (verified) Outpatient Encounter Medications as of 04/07/2024  Medication Sig   abemaciclib  (VERZENIO ) 100 MG tablet Take 1 tablet (100 mg total) by mouth 2 (two) times daily.   b complex vitamins tablet Take 1 tablet by mouth daily.   Calcium -Magnesium-Vitamin D  (CALCIUM  1200+D3 PO) Take 1,200 mg by mouth daily.    diphenoxylate -atropine  (LOMOTIL ) 2.5-0.025 MG tablet Take 2 tablets by mouth 4 (four) times daily as needed for diarrhea or loose stools.   loperamide  (IMODIUM ) 2 MG capsule Take 2 tabs by mouth with first loose stool, then 1 tab with each additional loose stool as needed. Do not exceed 8 tabs in a 24-hour period   loratadine (CLARITIN) 10 MG tablet Take 10 mg by mouth every morning.    Melatonin 5 MG CAPS Take 5 mg by mouth daily as needed (sleep).   Multiple Vitamin (MULTIVITAMIN WITH MINERALS) TABS tablet Take 1 tablet by mouth daily. Centrum Silver   Naphazoline HCl (CLEAR EYES OP) Place 1 drop into both eyes daily.   ondansetron  (ZOFRAN ) 8 MG tablet Take 1 tablet (8 mg total) by mouth every 8 (eight) hours as needed for nausea or vomiting.   Probiotic Product (PROBIOTIC PO) Take 1 capsule by mouth daily.   prochlorperazine  (COMPAZINE ) 10 MG tablet Take  1 tablet (10 mg total) by mouth every 6 (six) hours as needed for nausea or vomiting.   traMADol  (ULTRAM ) 50 MG tablet Take 1 tablet (50 mg total) by mouth every 6 (six) hours as needed for moderate pain. (Patient not taking: Reported on 04/07/2024)   Facility-Administered Encounter Medications as of 04/07/2024  Medication   heparin  lock flush 100 unit/mL    Allergies (verified) Peanut-containing drug products   History: Past Medical History:  Diagnosis Date   Ankle fracture    Breast cancer (HCC)     bilateral   Cancer (HCC)    Basal Cell Carcinoma, about 2011 chest   Cataract    Family history of adverse reaction to anesthesia    daughter with PONV   Hemorrhoids    History of methicillin resistant staphylococcus aureus (MRSA) 2015   Migraines    MIGRAINES occ   Personal history of chemotherapy    Personal history of radiation therapy    Wears glasses    Past Surgical History:  Procedure Laterality Date   BREAST BIOPSY Bilateral 10/29/2017   L breast DCIS, R breast invasive mammary carcinoma of no special type, ER/PR+, Her2/neu 1+   BREAST RECONSTRUCTION Right 02/07/2020   Procedure: BREAST RECONSTRUCTION;  Surgeon: Thornell Flirt, DO;  Location: [facility];  Service: Plastics;  Laterality: Right;   BREAST RECONSTRUCTION WITH PLACEMENT OF TISSUE EXPANDER AND FLEX HD (ACELLULAR HYDRATED DERMIS) Bilateral 08/30/2019   Procedure: BREAST RECONSTRUCTION WITH PLACEMENT OF TISSUE EXPANDER AND FLEX HD (ACELLULAR HYDRATED DERMIS);  Surgeon: Thornell Flirt, DO;  Location: [facility];  Service: Plastics;  Laterality: Bilateral;  2.5 hours, please   CESAREAN SECTION     COLONOSCOPY  2012   COLONOSCOPY WITH PROPOFOL  N/A 12/01/2018   Procedure: COLONOSCOPY WITH PROPOFOL ;  Surgeon: Marnee Sink, MD;  Location: [facility];  Service: Endoscopy;  Laterality: N/A;   DEBRIDEMENT AND CLOSURE WOUND Bilateral 10/02/2020   Procedure: Excision of bilateral breast wound;  Surgeon: Thornell Flirt, DO;  Location: [facility];  Service: Plastics;  Laterality: Bilateral;  1.5 hours, please   FRACTURE SURGERY     ankle   IR CT BONE TROCAR/NEEDLE BIOPSY DEEP  12/26/2022   IR KYPHO LUMBAR INC FX REDUCE BONE BX UNI/BIL CANNULATION INC/IMAGING  05/14/2023   IR RADIOLOGIST EVAL & MGMT  04/28/2023   IR RADIOLOGIST EVAL & MGMT  06/27/2023   IRRIGATION AND DEBRIDEMENT ABSCESS Right 03/12/2018   Procedure: IRRIGATION AND DEBRIDEMENT RIGHT AXILLARY ABSCESS;  Surgeon: Marshall Skeeter,  MD;  Location: [facility];  Service: General;  Laterality: Right;   LATISSIMUS FLAP TO BREAST Right 02/07/2020   Procedure: LATISSIMUS MYOCUTANEOUS FLAP TO BREAST;  Surgeon: Thornell Flirt, DO;  Location: [facility];  Service: Plastics;  Laterality: Right;  3 hours   MANDIBLE FRACTURE SURGERY     MASTECTOMY Bilateral    2019   MASTECTOMY MODIFIED RADICAL Right 07/10/2018   ypT2 ypN2a ER/PR positive, HER-2/neu negative  Surgeon: Marshall Skeeter, MD;  Location: [facility];  Service: General;  Laterality: Right;   PORT-A-CATH REMOVAL Left 02/07/2020   Procedure: REMOVAL PORT-A-CATH;  Surgeon: Thornell Flirt, DO;  Location: [facility];  Service: Plastics;  Laterality: Left;   PORTACATH PLACEMENT Left 11/28/2017   Procedure: INSERTION PORT-A-CATH;  Surgeon: Marshall Skeeter, MD;  Location: [facility];  Service: General;  Laterality: Left;   REMOVAL OF TISSUE EXPANDER Right 06/05/2020   Procedure: TISSUE EXPANDER REMOVAL;  Surgeon: Thornell Flirt, DO;  Location: [facility];  Service: Plastics;  Laterality: Right;   SIMPLE MASTECTOMY WITH AXILLARY SENTINEL NODE BIOPSY Left 07/10/2018   High-grade DCIS SIMPLE MASTECTOMY;  Surgeon: Marshall Skeeter, MD;  Location: [facility];  Service: General;  Laterality: Left;   TISSUE EXPANDER PLACEMENT Left 06/05/2020   Procedure: TISSUE EXPANDER FLUID REMOVAL-100ML;  Surgeon: Thornell Flirt, DO;  Location: [facility];  Service: Plastics;  Laterality: Left;   TISSUE EXPANDER PLACEMENT Left 06/14/2020   Procedure: TISSUE EXPANDER REMOVAL;  Surgeon: Thornell Flirt, DO;  Location: [facility];  Service: Plastics;  Laterality: Left;  45 min, please   Family History  Problem Relation Age of Onset   Cancer Mother        Esophageal Cancer   Early death Mother        Age 39   Cancer Father        Lung Cancer   Diabetes Father    Diabetes Brother        Controlled with diet   Heart attack Paternal Grandfather    Colon cancer Neg Hx    Social History    Socioeconomic History   Marital status: Divorced    Spouse name: Not on file   Number of children: 2   Years of education: Not on file   Highest education level: 12th grade  Occupational History   Occupation: Retired  Tobacco Use   Smoking status: Former    Current packs/day: 0.00    Average packs/day: 0.3 packs/day for 10.0 years (2.5 ttl pk-yrs)    Types: Cigarettes    Start date: 1968    Quit date: 1978    Years since quitting: 47.4   Smokeless tobacco: Never  Vaping Use   Vaping status: Never Used  Substance and Sexual Activity   Alcohol use: Yes    Alcohol/week: 1.0 standard drink of alcohol    Types: 1 Glasses of wine per week    Comment: " occasional glass of wine "   Drug use: No   Sexual activity: Not Currently    Birth control/protection: Post-menopausal  Other Topics Concern   Not on file  Social History Narrative   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    Lives at home (son 59 yo lives with her)   Children 2   Pets: 3 small dogs and 1 frog    Caffeine- 1 cup of coffee in the morning, rare tea   Social Drivers of Health   Financial Resource Strain: Medium Risk (04/03/2024)   Overall Financial Resource Strain (CARDIA)    Difficulty of Paying Living Expenses: Somewhat hard  Food Insecurity: Food Insecurity Present (04/07/2024)   Hunger Vital Sign    Worried About Running Out of Food in the Last Year: Sometimes true    Ran Out of Food in the Last Year: Never true  Transportation Needs: No Transportation Needs (04/03/2024)   PRAPARE - Administrator, Civil Service (Medical): No    Lack of Transportation (Non-Medical): No  Physical Activity: Sufficiently Active (04/03/2024)   Exercise Vital Sign    Days of Exercise per Week: 7 days    Minutes of Exercise per Session: 30 min  Stress: No Stress Concern Present (04/03/2024)   [employer] of Occupational Health - Occupational Stress Questionnaire    Feeling of Stress : Not at all  Social  Connections: Moderately Isolated (04/03/2024)   Social Connection and Isolation Panel [NHANES]    Frequency of Communication with Friends  and Family: More than three times a week    Frequency of Social Gatherings with Friends and Family: More than three times a week    Attends Religious Services: More than 4 times per year    Active Member of [employer] or Organizations: No    Attends Banker Meetings: Never    Marital Status: Divorced    Tobacco Counseling Counseling given: Not Answered    Clinical Intake:  Pre-visit preparation completed: Yes  Pain : No/denies pain     BMI - recorded: 24.96 Nutritional Status: BMI of 19-24  Normal Nutritional Risks: None Diabetes: No  Lab Results  Component Value Date   HGBA1C 6.0 09/25/2021   HGBA1C 5.6 09/08/2020   HGBA1C 5.9 08/20/2017     How often do you need to have someone help you when you read instructions, pamphlets, or other written materials from your doctor or pharmacy?: 1 - Never  Interpreter Needed?: No  Information entered by :: R. Allisha Harter LPN   Activities of Daily Living     04/03/2024    8:41 AM 03/29/2024    9:02 AM  In your present state of health, do you have any difficulty performing the following activities:  Hearing? 0 0  Vision? 0 0  Comment readers   Difficulty concentrating or making decisions? 0 0  Walking or climbing stairs? 0 0  Dressing or bathing? 0 0  Doing errands, shopping? 0 0  Preparing Food and eating ? N N  Using the Toilet? N N  In the past six months, have you accidently leaked urine? N N  Do you have problems with loss of bowel control? N N  Managing your Medications? N N  Managing your Finances? N N  Housekeeping or managing your Housekeeping? N N    Patient Care Team: Calista Catching, FNP as PCP - General (Family Medicine) Burnie Cartwright, RN as Oncology Nurse Navigator Byrnett, Magali Schmitz, MD (General Surgery) Kent Pear, MD (Inactive) as Consulting  Physician (Family Medicine) Rosan Comfort, MD as Consulting Physician (Neurology) Avonne Boettcher, MD as Consulting Physician (Oncology) Glenis Langdon, MD as Consulting Physician (Radiation Oncology)  Indicate any recent Medical Services you may have received from other than [facility] providers in the past year (date may be approximate).     Assessment:    This is a routine wellness examination for [place].  Hearing/Vision screen Hearing Screening - Comments:: No issues Vision Screening - Comments:: readers   Goals Addressed             This Visit's Progress    Patient Stated       Wants to stay more active        Depression Screen     04/07/2024    1:08 PM 04/07/2024    9:33 AM 04/02/2023    8:38 AM 03/21/2022    8:39 AM 09/10/2021    3:07 PM 09/08/2020    3:39 PM 10/14/2018    3:24 PM  PHQ 2/9 Scores  PHQ - 2 Score 0 0 0 0 0 0 0  PHQ- 9 Score 0 0    2 0    Fall Risk     04/07/2024    9:24 AM 04/03/2024    8:41 AM 03/29/2024    9:02 AM 04/02/2023    8:39 AM 03/29/2023    8:45 AM  Fall Risk   Falls in the past year? 0 0 0 0 0  Number falls in  past yr: 0 0 0 0 0  Injury with Fall? 0 0 0 0 0  Risk for fall due to : No Fall Risks No Fall Risks  No Fall Risks   Follow up Falls evaluation completed Falls evaluation completed;Falls prevention discussed  Falls evaluation completed     MEDICARE RISK AT HOME:  Medicare Risk at Home Any stairs in or around the home?: (Patient-Rptd) Yes If so, are there any without handrails?: (Patient-Rptd) No Home free of loose throw rugs in walkways, pet beds, electrical cords, etc?: (Patient-Rptd) Yes Adequate lighting in your home to reduce risk of falls?: (Patient-Rptd) Yes Life alert?: (Patient-Rptd) No Use of a cane, walker or w/c?: (Patient-Rptd) No Grab bars in the bathroom?: (Patient-Rptd) No Shower chair or bench in shower?: (Patient-Rptd) No Elevated toilet seat or a handicapped toilet?: (Patient-Rptd) No  TIMED UP AND  GO:  Was the test performed?  No  Cognitive Function: 6CIT completed        04/07/2024    1:18 PM 04/02/2023    8:44 AM  6CIT Screen  What Year? 0 points 0 points  What month? 0 points 0 points  What time? 0 points 0 points  Count back from 20 0 points 0 points  Months in reverse 0 points 0 points  Repeat phrase 0 points 2 points  Total Score 0 points 2 points    Immunizations Immunization History  Administered Date(s) Administered   Influenza,inj,Quad PF,6+ Mos 12/05/2017, 10/14/2018   PFIZER(Purple Top)SARS-COV-2 Vaccination 04/24/2020, 05/15/2020   PNEUMOCOCCAL CONJUGATE-20 04/07/2024   Pfizer Covid-19 Vaccine Bivalent Booster 13yrs & up 06/25/2021   Tdap 11/20/2009, 09/08/2020    Screening Tests Health Maintenance  Topic Date Due   COVID-19 Vaccine (4 - 2024-25 season) 07/20/2023   Colonoscopy  12/02/2023   Medicare Annual Wellness (AWV)  04/01/2024   Zoster Vaccines- Shingrix (1 of 2) 07/08/2024 (Originally 04/14/1975)   INFLUENZA VACCINE  06/18/2024   DTaP/Tdap/Td (3 - Td or Tdap) 09/08/2030   Pneumonia Vaccine 61+ Years old  Completed   DEXA SCAN  Completed   Hepatitis C Screening  Completed   HPV VACCINES  Aged Out   Meningococcal B Vaccine  Aged Out   MAMMOGRAM  Discontinued    Health Maintenance  Health Maintenance Due  Topic Date Due   COVID-19 Vaccine (4 - 2024-25 season) 07/20/2023   Colonoscopy  12/02/2023   Medicare Annual Wellness (AWV)  04/01/2024   Health Maintenance Items Addressed: Discussed the need to update shingles vaccines. Declines covid vaccine at this time. Per patient PCP is working on colonoscopy.  Additional Screening:  Vision Screening: Recommended annual ophthalmology exams for early detection of glaucoma and other disorders of the eye. Up to date [facility]  Dental Screening: Recommended annual dental exams for proper oral hygiene  Community Resource Referral / Chronic Care Management: CRR required this visit?  No    CCM required this visit?  No   Plan:    I have personally reviewed and noted the following in the patient's chart:   Medical and social history Use of alcohol, tobacco or illicit drugs  Current medications and supplements including opioid prescriptions. Patient is not currently taking opioid prescriptions. Functional ability and status Nutritional status Physical activity Advanced directives List of other physicians Hospitalizations, surgeries, and ER visits in previous 12 months Vitals Screenings to include cognitive, depression, and falls Referrals and appointments  In addition, I have reviewed and discussed with patient certain preventive protocols, quality metrics, and best  practice recommendations. A written personalized care plan for preventive services as well as general preventive health recommendations were provided to patient.   Felicitas Horse, LPN   1/61/0960   After Visit Summary: (MyChart) Due to this being a telephonic visit, the after visit summary with patients personalized plan was offered to patient via MyChart   Notes: Nothing significant to report at this time.

## 2024-04-07 NOTE — Addendum Note (Signed)
 Addended by: Sherelle Castelli on: 04/07/2024 10:32 AM   Modules accepted: Orders

## 2024-04-07 NOTE — Patient Instructions (Addendum)
 Referral to gastroenterology as colonoscopy is due at 5 years. I clarified with Dr Ole Berkeley.     Also sent Dr. Jennell Moccasin note in regards to confirming this as a letter and procedure report say conflicting thanks.  Let us  know if you dont hear back within a week in regards to an appointment being scheduled.   So that you are aware, if you are [facility] MyChart user , please pay attention to your MyChart messages as you may receive a MyChart message with a phone number to call and schedule this test/appointment own your own from our referral coordinator. This is a new process so I do not want you to miss this message.  If you are not a MyChart user, you will receive a phone call.   Please ensure annual follow-up with  dermatology  Health Maintenance for Postmenopausal Women Menopause is a normal process in which your ability to get pregnant comes to an end. This process happens slowly over many months or years, usually between the ages of 33 and 14. Menopause is complete when you have missed your menstrual period for 12 months. It is important to talk with your health care provider about some of the most common conditions that affect women after menopause (postmenopausal women). These include heart disease, cancer, and bone loss (osteoporosis). Adopting a healthy lifestyle and getting preventive care can help to promote your health and wellness. The actions you take can also lower your chances of developing some of these common conditions. What are the signs and symptoms of menopause? During menopause, you may have the following symptoms: Hot flashes. These can be moderate or severe. Night sweats. Decrease in sex drive. Mood swings. Headaches. Tiredness (fatigue). Irritability. Memory problems. Problems falling asleep or staying asleep. Talk with your health care provider about treatment options for your symptoms. Do I need hormone replacement therapy? Hormone replacement therapy is effective in  treating symptoms that are caused by menopause, such as hot flashes and night sweats. Hormone replacement carries certain risks, especially as you become older. If you are thinking about using estrogen or estrogen with progestin, discuss the benefits and risks with your health care provider. How can I reduce my risk for heart disease and stroke? The risk of heart disease, heart attack, and stroke increases as you age. One of the causes may be a change in the body's hormones during menopause. This can affect how your body uses dietary fats, triglycerides, and cholesterol. Heart attack and stroke are medical emergencies. There are many things that you can do to help prevent heart disease and stroke. Watch your blood pressure High blood pressure causes heart disease and increases the risk of stroke. This is more likely to develop in people who have high blood pressure readings or are overweight. Have your blood pressure checked: Every 3-5 years if you are 50-18 years of age. Every year if you are 60 years old or older. Eat a healthy diet  Eat a diet that includes plenty of vegetables, fruits, low-fat dairy products, and lean protein. Do not eat a lot of foods that are high in solid fats, added sugars, or sodium. Get regular exercise Get regular exercise. This is one of the most important things you can do for your health. Most adults should: Try to exercise for at least 150 minutes each week. The exercise should increase your heart rate and make you sweat (moderate-intensity exercise). Try to do strengthening exercises at least twice each week. Do these in addition to the  moderate-intensity exercise. Spend less time sitting. Even light physical activity can be beneficial. Other tips Work with your health care provider to achieve or maintain a healthy weight. Do not use any products that contain nicotine or tobacco. These products include cigarettes, chewing tobacco, and vaping devices, such as  e-cigarettes. If you need help quitting, ask your health care provider. Know your numbers. Ask your health care provider to check your cholesterol and your blood sugar (glucose). Continue to have your blood tested as directed by your health care provider. Do I need screening for cancer? Depending on your health history and family history, you may need to have cancer screenings at different stages of your life. This may include screening for: Breast cancer. Cervical cancer. Lung cancer. Colorectal cancer. What is my risk for osteoporosis? After menopause, you may be at increased risk for osteoporosis. Osteoporosis is a condition in which bone destruction happens more quickly than new bone creation. To help prevent osteoporosis or the bone fractures that can happen because of osteoporosis, you may take the following actions: If you are 59-31 years old, get at least 1,000 mg of calcium  and at least 600 international units (IU) of vitamin D  per day. If you are older than age 3 but younger than age 19, get at least 1,200 mg of calcium  and at least 600 international units (IU) of vitamin D  per day. If you are older than age 66, get at least 1,200 mg of calcium  and at least 800 international units (IU) of vitamin D  per day. Smoking and drinking excessive alcohol increase the risk of osteoporosis. Eat foods that are rich in calcium  and vitamin D , and do weight-bearing exercises several times each week as directed by your health care provider. How does menopause affect my mental health? Depression may occur at any age, but it is more common as you become older. Common symptoms of depression include: Feeling depressed. Changes in sleep patterns. Changes in appetite or eating patterns. Feeling an overall lack of motivation or enjoyment of activities that you previously enjoyed. Frequent crying spells. Talk with your health care provider if you think that you are experiencing any of these  symptoms. General instructions See your health care provider for regular wellness exams and vaccines. This may include: Scheduling regular health, dental, and eye exams. Getting and maintaining your vaccines. These include: Influenza vaccine. Get this vaccine each year before the flu season begins. Pneumonia vaccine. Shingles vaccine. Tetanus, diphtheria, and pertussis (Tdap) booster vaccine. Your health care provider may also recommend other immunizations. Tell your health care provider if you have ever been abused or do not feel safe at home. Summary Menopause is a normal process in which your ability to get pregnant comes to an end. This condition causes hot flashes, night sweats, decreased interest in sex, mood swings, headaches, or lack of sleep. Treatment for this condition may include hormone replacement therapy. Take actions to keep yourself healthy, including exercising regularly, eating a healthy diet, watching your weight, and checking your blood pressure and blood sugar levels. Get screened for cancer and depression. Make sure that you are up to date with all your vaccines. This information is not intended to replace advice given to you by your health care provider. Make sure you discuss any questions you have with your health care provider. Document Revised: 03/26/2021 Document Reviewed: 03/26/2021 Elsevier Patient Education  2024 [employer].

## 2024-04-08 ENCOUNTER — Inpatient Hospital Stay: Attending: Oncology

## 2024-04-08 ENCOUNTER — Ambulatory Visit: Payer: Self-pay | Admitting: Family

## 2024-04-08 ENCOUNTER — Other Ambulatory Visit: Payer: Self-pay

## 2024-04-08 ENCOUNTER — Other Ambulatory Visit (HOSPITAL_COMMUNITY): Payer: Self-pay

## 2024-04-08 ENCOUNTER — Inpatient Hospital Stay

## 2024-04-08 ENCOUNTER — Encounter: Payer: Self-pay | Admitting: Oncology

## 2024-04-08 DIAGNOSIS — C7951 Secondary malignant neoplasm of bone: Secondary | ICD-10-CM | POA: Insufficient documentation

## 2024-04-08 DIAGNOSIS — Z5111 Encounter for antineoplastic chemotherapy: Secondary | ICD-10-CM | POA: Diagnosis not present

## 2024-04-08 DIAGNOSIS — C50411 Malignant neoplasm of upper-outer quadrant of right female breast: Secondary | ICD-10-CM

## 2024-04-08 DIAGNOSIS — Z170 Estrogen receptor positive status [ER+]: Secondary | ICD-10-CM | POA: Insufficient documentation

## 2024-04-08 DIAGNOSIS — M85852 Other specified disorders of bone density and structure, left thigh: Secondary | ICD-10-CM

## 2024-04-08 DIAGNOSIS — Z0000 Encounter for general adult medical examination without abnormal findings: Secondary | ICD-10-CM

## 2024-04-08 LAB — CBC WITH DIFFERENTIAL (CANCER CENTER ONLY)
Abs Immature Granulocytes: 0.04 10*3/uL (ref 0.00–0.07)
Basophils Absolute: 0.1 10*3/uL (ref 0.0–0.1)
Basophils Relative: 2 %
Eosinophils Absolute: 0.1 10*3/uL (ref 0.0–0.5)
Eosinophils Relative: 3 %
HCT: 36.3 % (ref 36.0–46.0)
Hemoglobin: 12.7 g/dL (ref 12.0–15.0)
Immature Granulocytes: 1 %
Lymphocytes Relative: 19 %
Lymphs Abs: 0.7 10*3/uL (ref 0.7–4.0)
MCH: 34.5 pg — ABNORMAL HIGH (ref 26.0–34.0)
MCHC: 35 g/dL (ref 30.0–36.0)
MCV: 98.6 fL (ref 80.0–100.0)
Monocytes Absolute: 0.3 10*3/uL (ref 0.1–1.0)
Monocytes Relative: 9 %
Neutro Abs: 2.3 10*3/uL (ref 1.7–7.7)
Neutrophils Relative %: 66 %
Platelet Count: 162 10*3/uL (ref 150–400)
RBC: 3.68 MIL/uL — ABNORMAL LOW (ref 3.87–5.11)
RDW: 12.5 % (ref 11.5–15.5)
WBC Count: 3.5 10*3/uL — ABNORMAL LOW (ref 4.0–10.5)
nRBC: 0 % (ref 0.0–0.2)

## 2024-04-08 LAB — CMP (CANCER CENTER ONLY)
ALT: 25 U/L (ref 0–44)
AST: 33 U/L (ref 15–41)
Albumin: 3.9 g/dL (ref 3.5–5.0)
Alkaline Phosphatase: 56 U/L (ref 38–126)
Anion gap: 6 (ref 5–15)
BUN: 14 mg/dL (ref 8–23)
CO2: 26 mmol/L (ref 22–32)
Calcium: 8.8 mg/dL — ABNORMAL LOW (ref 8.9–10.3)
Chloride: 105 mmol/L (ref 98–111)
Creatinine: 1.1 mg/dL — ABNORMAL HIGH (ref 0.44–1.00)
GFR, Estimated: 55 mL/min — ABNORMAL LOW (ref >60)
Glucose, Bld: 95 mg/dL (ref 70–99)
Potassium: 4.6 mmol/L (ref 3.5–5.1)
Sodium: 137 mmol/L (ref 135–145)
Total Bilirubin: 1.1 mg/dL (ref 0.0–1.2)
Total Protein: 7.3 g/dL (ref 6.5–8.1)

## 2024-04-08 MED ORDER — FULVESTRANT 250 MG/5ML IM SOSY
500.0000 mg | PREFILLED_SYRINGE | Freq: Once | INTRAMUSCULAR | Status: AC
  Administered 2024-04-08: 500 mg via INTRAMUSCULAR
  Filled 2024-04-08: qty 10

## 2024-04-09 ENCOUNTER — Encounter: Payer: Self-pay | Admitting: Oncology

## 2024-04-27 ENCOUNTER — Ambulatory Visit
Admission: RE | Admit: 2024-04-27 | Discharge: 2024-04-27 | Disposition: A | Discharge status: Home / Self Care | Source: Ambulatory Visit | Attending: Oncology | Admitting: Oncology

## 2024-04-27 DIAGNOSIS — C50411 Malignant neoplasm of upper-outer quadrant of right female breast: Secondary | ICD-10-CM | POA: Insufficient documentation

## 2024-04-27 DIAGNOSIS — K8020 Calculus of gallbladder without cholecystitis without obstruction: Secondary | ICD-10-CM | POA: Diagnosis not present

## 2024-04-27 DIAGNOSIS — C7951 Secondary malignant neoplasm of bone: Secondary | ICD-10-CM | POA: Diagnosis not present

## 2024-04-27 DIAGNOSIS — J8410 Pulmonary fibrosis, unspecified: Secondary | ICD-10-CM | POA: Diagnosis not present

## 2024-04-27 MED ORDER — IOHEXOL 350 MG/ML SOLN
75.0000 mL | Freq: Once | INTRAVENOUS | Status: AC | PRN
  Administered 2024-04-27: 75 mL via INTRAVENOUS

## 2024-05-03 ENCOUNTER — Telehealth: Payer: Self-pay | Admitting: Family

## 2024-05-03 DIAGNOSIS — Z1211 Encounter for screening for malignant neoplasm of colon: Secondary | ICD-10-CM

## 2024-05-03 NOTE — Addendum Note (Signed)
 Addended by: Steadman Prosperi G on: 05/03/2024 03:01 PM   Modules accepted: Orders

## 2024-05-03 NOTE — Telephone Encounter (Signed)
 Copied from CRM [id number]. Topic: Referral - Request for Referral >> May 03, 2024 11:19 AM Clyde Darling P wrote: Did the patient discuss referral with their provider in the last year? Yes (If No - schedule appointment) (If Yes - send message)  Appointment offered? Yes  Type of order/referral and detailed reason for visit: Colonoscopy - was referred to go to another facility but they advise she would need to go to [place]   Preference of office, provider, location: [facility]   If referral order, have you been seen by this specialty before? Yes (If Yes, this issue or another issue? When? Where? 12/01/2018 for screening  Can we respond through MyChart? Yes

## 2024-05-03 NOTE — Telephone Encounter (Signed)
 Call patient I have rereplaced colonoscopy referral to Dr. Ole Berkeley.   Dr. Ole Berkeley is no longer in clinic however he is still doing colonoscopies at [facility]  This has been placed.  Ask her to call the office if she does not hear in regards to referral in the next 2 weeks

## 2024-05-03 NOTE — Telephone Encounter (Signed)
 Pt.notified

## 2024-05-06 ENCOUNTER — Other Ambulatory Visit: Payer: Self-pay

## 2024-05-06 ENCOUNTER — Other Ambulatory Visit (HOSPITAL_COMMUNITY): Payer: Self-pay

## 2024-05-06 ENCOUNTER — Other Ambulatory Visit: Payer: Self-pay | Admitting: Oncology

## 2024-05-06 DIAGNOSIS — C50411 Malignant neoplasm of upper-outer quadrant of right female breast: Secondary | ICD-10-CM

## 2024-05-06 MED ORDER — ABEMACICLIB 100 MG PO TABS
100.0000 mg | ORAL_TABLET | Freq: Two times a day (BID) | ORAL | 0 refills | Status: DC
  Filled 2024-05-06 – 2024-05-11 (×2): qty 56, 28d supply, fill #0

## 2024-05-11 ENCOUNTER — Inpatient Hospital Stay: Attending: Oncology

## 2024-05-11 ENCOUNTER — Inpatient Hospital Stay: Admitting: Oncology

## 2024-05-11 ENCOUNTER — Other Ambulatory Visit: Payer: Self-pay

## 2024-05-11 ENCOUNTER — Encounter: Payer: Self-pay | Admitting: Oncology

## 2024-05-11 ENCOUNTER — Other Ambulatory Visit (HOSPITAL_COMMUNITY): Payer: Self-pay

## 2024-05-11 ENCOUNTER — Inpatient Hospital Stay

## 2024-05-11 VITALS — BP 109/75 | HR 80 | Temp 98.3°F | Resp 20 | Wt 151.2 lb

## 2024-05-11 DIAGNOSIS — Z5111 Encounter for antineoplastic chemotherapy: Secondary | ICD-10-CM | POA: Insufficient documentation

## 2024-05-11 DIAGNOSIS — Z1732 Human epidermal growth factor receptor 2 negative status: Secondary | ICD-10-CM | POA: Insufficient documentation

## 2024-05-11 DIAGNOSIS — C773 Secondary and unspecified malignant neoplasm of axilla and upper limb lymph nodes: Secondary | ICD-10-CM | POA: Diagnosis not present

## 2024-05-11 DIAGNOSIS — C7951 Secondary malignant neoplasm of bone: Secondary | ICD-10-CM | POA: Diagnosis not present

## 2024-05-11 DIAGNOSIS — Z170 Estrogen receptor positive status [ER+]: Secondary | ICD-10-CM | POA: Insufficient documentation

## 2024-05-11 DIAGNOSIS — C50411 Malignant neoplasm of upper-outer quadrant of right female breast: Secondary | ICD-10-CM

## 2024-05-11 DIAGNOSIS — D702 Other drug-induced agranulocytosis: Secondary | ICD-10-CM | POA: Diagnosis not present

## 2024-05-11 DIAGNOSIS — Z1721 Progesterone receptor positive status: Secondary | ICD-10-CM | POA: Insufficient documentation

## 2024-05-11 DIAGNOSIS — Z79818 Long term (current) use of other agents affecting estrogen receptors and estrogen levels: Secondary | ICD-10-CM | POA: Diagnosis not present

## 2024-05-11 DIAGNOSIS — Z79899 Other long term (current) drug therapy: Secondary | ICD-10-CM | POA: Diagnosis not present

## 2024-05-11 LAB — CBC WITH DIFFERENTIAL (CANCER CENTER ONLY)
Abs Immature Granulocytes: 0.01 10*3/uL (ref 0.00–0.07)
Basophils Absolute: 0.1 10*3/uL (ref 0.0–0.1)
Basophils Relative: 3 %
Eosinophils Absolute: 0.1 10*3/uL (ref 0.0–0.5)
Eosinophils Relative: 3 %
HCT: 35.3 % — ABNORMAL LOW (ref 36.0–46.0)
Hemoglobin: 12.3 g/dL (ref 12.0–15.0)
Immature Granulocytes: 0 %
Lymphocytes Relative: 29 %
Lymphs Abs: 0.8 10*3/uL (ref 0.7–4.0)
MCH: 35.3 pg — ABNORMAL HIGH (ref 26.0–34.0)
MCHC: 34.8 g/dL (ref 30.0–36.0)
MCV: 101.4 fL — ABNORMAL HIGH (ref 80.0–100.0)
Monocytes Absolute: 0.4 10*3/uL (ref 0.1–1.0)
Monocytes Relative: 13 %
Neutro Abs: 1.5 10*3/uL — ABNORMAL LOW (ref 1.7–7.7)
Neutrophils Relative %: 52 %
Platelet Count: 155 10*3/uL (ref 150–400)
RBC: 3.48 MIL/uL — ABNORMAL LOW (ref 3.87–5.11)
RDW: 13 % (ref 11.5–15.5)
WBC Count: 2.8 10*3/uL — ABNORMAL LOW (ref 4.0–10.5)
nRBC: 0 % (ref 0.0–0.2)

## 2024-05-11 LAB — CMP (CANCER CENTER ONLY)
ALT: 28 U/L (ref 0–44)
AST: 44 U/L — ABNORMAL HIGH (ref 15–41)
Albumin: 3.9 g/dL (ref 3.5–5.0)
Alkaline Phosphatase: 42 U/L (ref 38–126)
Anion gap: 6 (ref 5–15)
BUN: 17 mg/dL (ref 8–23)
CO2: 25 mmol/L (ref 22–32)
Calcium: 8.8 mg/dL — ABNORMAL LOW (ref 8.9–10.3)
Chloride: 105 mmol/L (ref 98–111)
Creatinine: 1.29 mg/dL — ABNORMAL HIGH (ref 0.44–1.00)
GFR, Estimated: 45 mL/min — ABNORMAL LOW (ref >60)
Glucose, Bld: 80 mg/dL (ref 70–99)
Potassium: 4.1 mmol/L (ref 3.5–5.1)
Sodium: 136 mmol/L (ref 135–145)
Total Bilirubin: 1 mg/dL (ref 0.0–1.2)
Total Protein: 6.8 g/dL (ref 6.5–8.1)

## 2024-05-11 MED ORDER — FULVESTRANT 250 MG/5ML IM SOSY
500.0000 mg | PREFILLED_SYRINGE | Freq: Once | INTRAMUSCULAR | Status: AC
  Administered 2024-05-11: 500 mg via INTRAMUSCULAR
  Filled 2024-05-11: qty 10

## 2024-05-11 NOTE — Progress Notes (Signed)
 Specialty Pharmacy Refill Coordination Note  Spoke with Megan Vega, Megan Vega (Self).   Megan Vega is a 68 y.o. female contacted today regarding refills of specialty medication(s) Abemaciclib  (VERZENIO )  Patient requested: Delivery   Delivery date: 05/13/24 (or 05/14/24)   Verified address: [address]  [place] [address]  Medication will be filled on 05/12/24 or 05/13/24.

## 2024-05-11 NOTE — Progress Notes (Signed)
 Hematology/Oncology Consult note [facility]  Telephone:([phone] [phone]  Patient Care Team: Dineen Rollene MATSU, FNP as PCP - General (Family Medicine) Cindie Jesusa HERO, RN as Oncology Nurse Navigator Byrnett, Reyes ORN, MD (General Surgery) Maribeth Camellia MATSU, MD (Inactive) as Consulting Physician (Family Medicine) Maree Jannett POUR, MD as Consulting Physician (Neurology) Melanee Annah BROCKS, MD as Consulting Physician (Oncology) Lenn Aran, MD as Consulting Physician (Radiation Oncology)   Name of the patient: Megan Vega  [id number]  02-04-1956   Date of visit: 05/11/24  Diagnosis-  history of ER/PR positive HER2 negative breast cancer now with isolated L5 bone metastases stage III right breast cancer and left breast DCIS in 2019   Chief complaint/ Reason for visit-routine follow-up visit for metastatic ER positive breast cancer with isolated L5 metastasis  Heme/Onc history: Cruz Devilla is a 68 y.o. female with clinical stage [id number] right breast cancer and DCIS in the left breast s/p neoadjuvant chemotherapy followed by left simple mastectomy and right modified radical mastectomy on 07/10/2018.  Biopsy showed 1.4 cm grade 2 invasive mammary carcinoma ER greater than 90% positive PR greater than 90% positive and HER2 negative +1   Patient received neoadjuvant AC Taxol  chemotherapy until July 2019.   Left breast pathology revealed 2.8 cm residual high grade DCIS, comedo type.  There was atypical lobular hyperplasia.  Four sentinel lymph nodes were negative.  Right breast pathology revealed 3.6 cm residual grade II invasive mammary carcinoma and high grade DCIS.  There was lymphovascular invasion.There was metastatic carcinoma in 2 sentinel lymph nodes.  Right axillary dissection revealed metastatic carcinoma in 7 lymph nodes.  There were a total of 9 lymph nodes with macrometastasis (largest 22 mm).  Pathologic stage was ypT2 ypN2a.    Patient then received adjuvant radiation treatment and started letrozole  in February 2020   She underwent right breast reconstruction with latissimus myocutaneous flap and removal of her port-a-cath on 02/07/2020.  Expanders were taken out in 05/2020 secondary to infection.  She underwent excision of left breast wound with closure on 10/02/2020. Pathology revealed inflamed granulation tissue and fibrosis. There was no evidence of malignancy.  She received adjuvant Zometa  for 3 years   Patient found to have L5 vertebral lesion that was hypermetabolic on PET scan.  Biopsies negative for malignancy.  There was no lesions seen elsewhere.  A repeat scans in May 2024 again confirmed the presence of L5 vertebral lesion and patient had a second biopsy with osteo-cool procedure/kyphoplasty.Biopsy was consistent with metastatic adenocarcinoma compatible with breast primary.  ER 100% positive, PR 5% positive and HER2 negative.  Not enough tissue available for NGS testing.  Patient started on Faslodex  plus Verzenio  in July 2024  Interval history-tolerating Verzenio  well without any significant side effects.  Back pain is currently well-controlled  ECOG PS- 1 Pain scale- 0   Review of systems- Review of Systems  Constitutional:  Negative for chills, fever, malaise/fatigue and weight loss.  HENT:  Negative for congestion, ear discharge and nosebleeds.   Eyes:  Negative for blurred vision.  Respiratory:  Negative for cough, hemoptysis, sputum production, shortness of breath and wheezing.   Cardiovascular:  Negative for chest pain, palpitations, orthopnea and claudication.  Gastrointestinal:  Negative for abdominal pain, blood in stool, constipation, diarrhea, heartburn, melena, nausea and vomiting.  Genitourinary:  Negative for dysuria, flank pain, frequency, hematuria and urgency.  Musculoskeletal:  Negative for back pain, joint pain and myalgias.  Skin:  Negative for rash.  Neurological:  Negative for  dizziness, tingling, focal weakness, seizures, weakness and headaches.  Endo/Heme/Allergies:  Does not bruise/bleed easily.  Psychiatric/Behavioral:  Negative for depression and suicidal ideas. The patient does not have insomnia.       Allergies  Allergen Reactions   Peanut-Containing Drug Products Swelling and Other (See Comments)    Only when eating too many peanuts, swelling with lips and tingly face     Past Medical History:  Diagnosis Date   Ankle fracture    Breast cancer (HCC)    bilateral   Cancer (HCC)    Basal Cell Carcinoma, about 2011 chest   Cataract    Family history of adverse reaction to anesthesia    daughter with PONV   Hemorrhoids    History of methicillin resistant staphylococcus aureus (MRSA) 2015   Migraines    MIGRAINES occ   Personal history of chemotherapy    Personal history of radiation therapy    Wears glasses      Past Surgical History:  Procedure Laterality Date   BREAST BIOPSY Bilateral 10/29/2017   L breast DCIS, R breast invasive mammary carcinoma of no special type, ER/PR+, Her2/neu 1+   BREAST RECONSTRUCTION Right 02/07/2020   Procedure: BREAST RECONSTRUCTION;  Surgeon: Lowery Estefana RAMAN, DO;  Location: [facility];  Service: Plastics;  Laterality: Right;   BREAST RECONSTRUCTION WITH PLACEMENT OF TISSUE EXPANDER AND FLEX HD (ACELLULAR HYDRATED DERMIS) Bilateral 08/30/2019   Procedure: BREAST RECONSTRUCTION WITH PLACEMENT OF TISSUE EXPANDER AND FLEX HD (ACELLULAR HYDRATED DERMIS);  Surgeon: Lowery Estefana RAMAN, DO;  Location: [facility];  Service: Plastics;  Laterality: Bilateral;  2.5 hours, please   CESAREAN SECTION     COLONOSCOPY  2012   COLONOSCOPY WITH PROPOFOL  N/A 12/01/2018   Procedure: COLONOSCOPY WITH PROPOFOL ;  Surgeon: Jinny Carmine, MD;  Location: [facility];  Service: Endoscopy;  Laterality: N/A;   DEBRIDEMENT AND CLOSURE WOUND Bilateral 10/02/2020   Procedure: Excision of bilateral breast wound;  Surgeon: Lowery Estefana RAMAN, DO;  Location: [facility];  Service: Plastics;  Laterality: Bilateral;  1.5 hours, please   FRACTURE SURGERY     ankle   IR CT BONE TROCAR/NEEDLE BIOPSY DEEP  12/26/2022   IR KYPHO LUMBAR INC FX REDUCE BONE BX UNI/BIL CANNULATION INC/IMAGING  05/14/2023   IR RADIOLOGIST EVAL & MGMT  04/28/2023   IR RADIOLOGIST EVAL & MGMT  06/27/2023   IRRIGATION AND DEBRIDEMENT ABSCESS Right 03/12/2018   Procedure: IRRIGATION AND DEBRIDEMENT RIGHT AXILLARY ABSCESS;  Surgeon: Dessa Reyes ORN, MD;  Location: [facility];  Service: General;  Laterality: Right;   LATISSIMUS FLAP TO BREAST Right 02/07/2020   Procedure: LATISSIMUS MYOCUTANEOUS FLAP TO BREAST;  Surgeon: Lowery Estefana RAMAN, DO;  Location: [facility];  Service: Plastics;  Laterality: Right;  3 hours   MANDIBLE FRACTURE SURGERY     MASTECTOMY Bilateral    2019   MASTECTOMY MODIFIED RADICAL Right 07/10/2018   ypT2 ypN2a ER/PR positive, HER-2/neu negative  Surgeon: Dessa Reyes ORN, MD;  Location: [facility];  Service: General;  Laterality: Right;   PORT-A-CATH REMOVAL Left 02/07/2020   Procedure: REMOVAL PORT-A-CATH;  Surgeon: Lowery Estefana RAMAN, DO;  Location: [facility];  Service: Plastics;  Laterality: Left;   PORTACATH PLACEMENT Left 11/28/2017   Procedure: INSERTION PORT-A-CATH;  Surgeon: Dessa Reyes ORN, MD;  Location: [facility];  Service: General;  Laterality: Left;   REMOVAL OF TISSUE EXPANDER Right 06/05/2020   Procedure: TISSUE EXPANDER REMOVAL;  Surgeon: Lowery Estefana RAMAN, DO;  Location: [facility];  Service: Plastics;  Laterality: Right;   SIMPLE MASTECTOMY WITH AXILLARY SENTINEL NODE BIOPSY Left 07/10/2018   High-grade DCIS SIMPLE MASTECTOMY;  Surgeon: Dessa Reyes ORN, MD;  Location: [facility];  Service: General;  Laterality: Left;   TISSUE EXPANDER PLACEMENT Left 06/05/2020   Procedure: TISSUE EXPANDER FLUID REMOVAL-100ML;  Surgeon: Lowery Estefana RAMAN, DO;  Location: [facility];  Service: Plastics;  Laterality: Left;    TISSUE EXPANDER PLACEMENT Left 06/14/2020   Procedure: TISSUE EXPANDER REMOVAL;  Surgeon: Lowery Estefana RAMAN, DO;  Location: [facility];  Service: Plastics;  Laterality: Left;  45 min, please    Social History   Socioeconomic History   Marital status: Divorced    Spouse name: Not on file   Number of children: 2   Years of education: Not on file   Highest education level: 12th grade  Occupational History   Occupation: Retired  Tobacco Use   Smoking status: Former    Current packs/day: 0.00    Average packs/day: 0.3 packs/day for 10.0 years (2.5 ttl pk-yrs)    Types: Cigarettes    Start date: 1968    Quit date: 1978    Years since quitting: 47.5   Smokeless tobacco: Never  Vaping Use   Vaping status: Never Used  Substance and Sexual Activity   Alcohol use: Yes    Alcohol/week: 1.0 standard drink of alcohol    Types: 1 Glasses of wine per week    Comment:  occasional glass of wine    Drug use: No   Sexual activity: Not Currently    Birth control/protection: Post-menopausal  Other Topics Concern   Not on file  Social History Narrative   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    Lives at home (son 41 yo lives with her)   Children 2   Pets: 3 small dogs and 1 frog    Caffeine- 1 cup of coffee in the morning, rare tea   Social Drivers of Health   Financial Resource Strain: Medium Risk (04/03/2024)   Overall Financial Resource Strain (CARDIA)    Difficulty of Paying Living Expenses: Somewhat hard  Food Insecurity: Food Insecurity Present (04/07/2024)   Hunger Vital Sign    Worried About Running Out of Food in the Last Year: Sometimes true    Ran Out of Food in the Last Year: Never true  Transportation Needs: No Transportation Needs (04/03/2024)   PRAPARE - Administrator, Civil Service (Medical): No    Lack of Transportation (Non-Medical): No  Physical Activity: Sufficiently Active (04/03/2024)   Exercise Vital Sign    Days of Exercise per Week: 7 days    Minutes  of Exercise per Session: 30 min  Stress: No Stress Concern Present (04/03/2024)   [employer] of Occupational Health - Occupational Stress Questionnaire    Feeling of Stress : Not at all  Social Connections: Moderately Isolated (04/03/2024)   Social Connection and Isolation Panel    Frequency of Communication with Friends and Family: More than three times a week    Frequency of Social Gatherings with Friends and Family: More than three times a week    Attends Religious Services: More than 4 times per year    Active Member of [employer] or Organizations: No    Attends Banker Meetings: Never    Marital Status: Divorced  Catering manager Violence: Not At Risk (04/07/2024)   Humiliation, Afraid, Rape, and Kick questionnaire    Fear of  Current or Ex-Partner: No    Emotionally Abused: No    Physically Abused: No    Sexually Abused: No    Family History  Problem Relation Age of Onset   Cancer Mother        Esophageal Cancer   Early death Mother        Age 61   Cancer Father        Lung Cancer   Diabetes Father    Diabetes Brother        Controlled with diet   Heart attack Paternal Grandfather    Colon cancer Neg Hx      Current Outpatient Medications:    abemaciclib  (VERZENIO ) 100 MG tablet, Take 1 tablet (100 mg total) by mouth 2 (two) times daily., Disp: 56 tablet, Rfl: 0   b complex vitamins tablet, Take 1 tablet by mouth daily., Disp: , Rfl:    Calcium -Magnesium-Vitamin D  (CALCIUM  1200+D3 PO), Take 1,200 mg by mouth daily. , Disp: , Rfl:    diphenoxylate -atropine  (LOMOTIL ) 2.5-0.025 MG tablet, Take 2 tablets by mouth 4 (four) times daily as needed for diarrhea or loose stools., Disp: 30 tablet, Rfl: 1   loperamide  (IMODIUM ) 2 MG capsule, Take 2 tabs by mouth with first loose stool, then 1 tab with each additional loose stool as needed. Do not exceed 8 tabs in a 24-hour period, Disp: 60 capsule, Rfl: 2   loratadine (CLARITIN) 10 MG tablet, Take 10 mg by mouth every  morning. , Disp: , Rfl:    Melatonin 5 MG CAPS, Take 5 mg by mouth daily as needed (sleep)., Disp: , Rfl:    Multiple Vitamin (MULTIVITAMIN WITH MINERALS) TABS tablet, Take 1 tablet by mouth daily. Centrum Silver, Disp: , Rfl:    Naphazoline HCl (CLEAR EYES OP), Place 1 drop into both eyes daily., Disp: , Rfl:    ondansetron  (ZOFRAN ) 8 MG tablet, Take 1 tablet (8 mg total) by mouth every 8 (eight) hours as needed for nausea or vomiting., Disp: 15 tablet, Rfl: 0   Probiotic Product (PROBIOTIC PO), Take 1 capsule by mouth daily., Disp: , Rfl:    prochlorperazine  (COMPAZINE ) 10 MG tablet, Take 1 tablet (10 mg total) by mouth every 6 (six) hours as needed for nausea or vomiting., Disp: 30 tablet, Rfl: 1   traMADol  (ULTRAM ) 50 MG tablet, Take 1 tablet (50 mg total) by mouth every 6 (six) hours as needed for moderate pain. (Patient not taking: Reported on 05/11/2024), Disp: 10 tablet, Rfl: 0 No current facility-administered medications for this visit.  Facility-Administered Medications Ordered in Other Visits:    heparin  lock flush 100 unit/mL, 500 Units, Intracatheter, PRN, Corcoran, Melissa C, MD  Physical exam:  Vitals:   05/11/24 1102  BP: 109/75  Pulse: 80  Resp: 20  Temp: 98.3 F (36.8 C)  SpO2: 100%  Weight: 151 lb 3.2 oz (68.6 kg)   Physical Exam  Cardiovascular:     Rate and Rhythm: Normal rate and regular rhythm.     Heart sounds: Normal heart sounds.  Pulmonary:     Effort: Pulmonary effort is normal.     Breath sounds: Normal breath sounds.  Abdominal:     General: Bowel sounds are normal.   Skin:    General: Skin is warm and dry.   Neurological:     Mental Status: She is alert and oriented to person, place, and time.      I have personally reviewed labs listed below:    Latest Ref  Rng & Units 05/11/2024   10:48 AM  CMP  Glucose 70 - 99 mg/dL 80   BUN 8 - 23 mg/dL 17   Creatinine 9.55 - 1.00 mg/dL 8.70   Sodium 864 - 854 mmol/L 136   Potassium 3.5 - 5.1 mmol/L  4.1   Chloride 98 - 111 mmol/L 105   CO2 22 - 32 mmol/L 25   Calcium  8.9 - 10.3 mg/dL 8.8   Total Protein 6.5 - 8.1 g/dL 6.8   Total Bilirubin 0.0 - 1.2 mg/dL 1.0   Alkaline Phos 38 - 126 U/L 42   AST 15 - 41 U/L 44   ALT 0 - 44 U/L 28       Latest Ref Rng & Units 05/11/2024   10:48 AM  CBC  WBC 4.0 - 10.5 K/uL 2.8   Hemoglobin 12.0 - 15.0 g/dL 87.6   Hematocrit 63.9 - 46.0 % 35.3   Platelets 150 - 400 K/uL 155    I have personally reviewed Radiology images listed below: No images are attached to the encounter.  CT CHEST ABDOMEN PELVIS W CONTRAST Result Date: 04/28/2024 CLINICAL DATA:  Metastatic breast cancer restaging * Tracking Code: BO * EXAM: CT CHEST, ABDOMEN, AND PELVIS WITH CONTRAST TECHNIQUE: Multidetector CT imaging of the chest, abdomen and pelvis was performed following the standard protocol during bolus administration of intravenous contrast. RADIATION DOSE REDUCTION: This exam was performed according to the departmental dose-optimization program which includes automated exposure control, adjustment of the mA and/or kV according to patient size and/or use of iterative reconstruction technique. CONTRAST:  75mL OMNIPAQUE  IOHEXOL  350 MG/ML SOLN COMPARISON:  PET-CT, 09/03/2023 FINDINGS: CT CHEST FINDINGS Cardiovascular: No significant vascular findings. Normal heart size. No pericardial effusion. Mediastinum/Nodes: No enlarged mediastinal, hilar, or axillary lymph nodes. Thyroid  gland, trachea, and esophagus demonstrate no significant findings. Lungs/Pleura: Radiation fibrosis in the anterior right lung (series 4, image 44). No pleural effusion or pneumothorax. Musculoskeletal: Status post bilateral mastectomy. No acute osseous findings. CT ABDOMEN PELVIS FINDINGS Hepatobiliary: No solid liver abnormality is seen. Gallstones. No gallbladder wall thickening, or biliary dilatation. Pancreas: Unremarkable. No pancreatic ductal dilatation or surrounding inflammatory changes. Spleen: Normal  in size without significant abnormality. Adrenals/Urinary Tract: Adrenal glands are unremarkable. Kidneys are normal, without renal calculi, solid lesion, or hydronephrosis. Bladder is unremarkable. Stomach/Bowel: Stomach is within normal limits. Appendix appears normal. No evidence of bowel wall thickening, distention, or inflammatory changes. Sigmoid diverticula. Vascular/Lymphatic: No significant vascular findings are present. No enlarged abdominal or pelvic lymph nodes. Reproductive: No mass or other abnormality. Other: No abdominal wall hernia or abnormality. No ascites. Musculoskeletal: No acute osseous findings. Status post vertebral cement augmentation of a sclerotic metastasis of L5 (series 6, image 74). IMPRESSION: 1. Status post bilateral mastectomy. 2. Radiation fibrosis in the anterior right lung. 3. No evidence of lymphadenopathy or soft tissue metastatic disease in the chest, abdomen, or pelvis. 4. Status post vertebral cement augmentation of a sclerotic metastasis of L5. 5. Cholelithiasis. 6. Sigmoid diverticulosis without evidence of acute diverticulitis. Electronically Signed   By: Marolyn JONETTA Jaksch M.D.   On: 04/28/2024 07:57     Assessment and plan- Patient is a 68 y.o. female  female with history of metastatic ER/PR positive HER2 negative breast cancer with isolated L5 metastases.  She is here for a routine follow-up visit of breast cancer presently on Verzenio  plus Faslodex   I have reviewed CT chest abdomen pelvis images independently and discussed findings with the patient.  CT scans do not  show any evidence of recurrent or progressive disease.  Stable augmentation of sclerotic metastases involving L5.  Patient will continue with monthly Faslodex  at this time.  She is on Verzenio  100 mg twice daily which she will continue until progression or toxicity.  Serum creatinine fluctuates between 1-1.3 and it is 1.29 today.  Will repeat her BMP again in 1 month's time.  She will come for monthly  Faslodex  and I will see her back in 3 months with CBC with differential CMP and CA 27-29  Drug-induced neutropenia: Patient has chronic leukopenia/neutropenia but ANC remains more than 1 and therefore okay to continue with Verzenio  at present dose   Visit Diagnosis 1. Primary cancer of upper outer quadrant of right breast (HCC)   2. Use of fulvestrant  (Faslodex )   3. High risk medication use   4. Drug-induced neutropenia (HCC)      Dr. Annah Skene, MD, MPH [facility] at [facility] [phone] 05/11/2024 12:47 PM

## 2024-05-12 ENCOUNTER — Other Ambulatory Visit: Payer: Self-pay

## 2024-05-18 ENCOUNTER — Telehealth: Payer: Self-pay

## 2024-05-18 NOTE — Telephone Encounter (Signed)
 Patient left message stating she has questions about a letter she received about her grant ending for Verzenio  rx

## 2024-05-19 ENCOUNTER — Other Ambulatory Visit (HOSPITAL_COMMUNITY): Payer: Self-pay

## 2024-05-25 ENCOUNTER — Other Ambulatory Visit (HOSPITAL_COMMUNITY): Payer: Self-pay

## 2024-05-25 NOTE — Progress Notes (Signed)
 Specialty Pharmacy Ongoing Clinical Assessment Note  Megan Vega is a 68 y.o. female who is being followed by the specialty pharmacy service for RxSp Oncology   Patient's specialty medication(s) reviewed today: Abemaciclib  (VERZENIO )   Missed doses in the last 4 weeks: 0   Patient/Caregiver did not have any additional questions or concerns.   Therapeutic benefit summary: Patient is achieving benefit   Adverse events/side effects summary: Experienced adverse events/side effects (diarrhea, tolerable with the use of Imodium  OTC)   Patient's therapy is appropriate to: Continue    Goals Addressed             This Visit's Progress    Slow Disease Progression   On track    Patient is on track. Patient will maintain adherence. Per provider notes from 05/11/24, recent CT scans do not show any evidence of recurrent or progressive disease.          Follow up: 6 months  Silvano LOISE Dolly Specialty Pharmacist

## 2024-06-02 ENCOUNTER — Other Ambulatory Visit: Payer: Self-pay

## 2024-06-02 ENCOUNTER — Encounter (INDEPENDENT_AMBULATORY_CARE_PROVIDER_SITE_OTHER): Payer: Self-pay

## 2024-06-02 ENCOUNTER — Other Ambulatory Visit: Payer: Self-pay | Admitting: Oncology

## 2024-06-02 ENCOUNTER — Other Ambulatory Visit (HOSPITAL_COMMUNITY): Payer: Self-pay

## 2024-06-02 DIAGNOSIS — C50411 Malignant neoplasm of upper-outer quadrant of right female breast: Secondary | ICD-10-CM

## 2024-06-02 MED ORDER — ABEMACICLIB 100 MG PO TABS
100.0000 mg | ORAL_TABLET | Freq: Two times a day (BID) | ORAL | 0 refills | Status: DC
  Filled 2024-06-02: qty 56, 28d supply, fill #0

## 2024-06-02 NOTE — Progress Notes (Addendum)
 Specialty Pharmacy Refill Coordination Note  MyChart Questionnaire Submission  Megan Vega is a 68 y.o. female contacted today regarding refills of specialty medication(s) Verzenio .  Doses on hand: (Patient-Rptd) 17   Patient requested: (Patient-Rptd) Delivery   Delivery date: 06/04/24   Verified address: (Patient-Rptd) [address] [place] [address]  Medication will be filled on 06/03/24.    This fill date is pending response to refill request from provider. Patient is aware and if they have not received fill by intended date, they must follow up with pharmacy.

## 2024-06-03 ENCOUNTER — Other Ambulatory Visit (HOSPITAL_COMMUNITY): Payer: Self-pay

## 2024-06-10 ENCOUNTER — Inpatient Hospital Stay

## 2024-06-10 ENCOUNTER — Inpatient Hospital Stay: Attending: Oncology

## 2024-06-10 DIAGNOSIS — Z1732 Human epidermal growth factor receptor 2 negative status: Secondary | ICD-10-CM | POA: Diagnosis not present

## 2024-06-10 DIAGNOSIS — Z170 Estrogen receptor positive status [ER+]: Secondary | ICD-10-CM | POA: Diagnosis not present

## 2024-06-10 DIAGNOSIS — C7951 Secondary malignant neoplasm of bone: Secondary | ICD-10-CM | POA: Insufficient documentation

## 2024-06-10 DIAGNOSIS — Z5111 Encounter for antineoplastic chemotherapy: Secondary | ICD-10-CM | POA: Diagnosis not present

## 2024-06-10 DIAGNOSIS — Z1721 Progesterone receptor positive status: Secondary | ICD-10-CM | POA: Insufficient documentation

## 2024-06-10 DIAGNOSIS — C50411 Malignant neoplasm of upper-outer quadrant of right female breast: Secondary | ICD-10-CM | POA: Diagnosis not present

## 2024-06-10 LAB — BASIC METABOLIC PANEL - CANCER CENTER ONLY
Anion gap: 9 (ref 5–15)
BUN: 15 mg/dL (ref 8–23)
CO2: 25 mmol/L (ref 22–32)
Calcium: 9.2 mg/dL (ref 8.9–10.3)
Chloride: 101 mmol/L (ref 98–111)
Creatinine: 1.42 mg/dL — ABNORMAL HIGH (ref 0.44–1.00)
GFR, Estimated: 40 mL/min — ABNORMAL LOW (ref >60)
Glucose, Bld: 89 mg/dL (ref 70–99)
Potassium: 4.5 mmol/L (ref 3.5–5.1)
Sodium: 135 mmol/L (ref 135–145)

## 2024-06-10 MED ORDER — FULVESTRANT 250 MG/5ML IM SOSY
500.0000 mg | PREFILLED_SYRINGE | Freq: Once | INTRAMUSCULAR | Status: AC
  Administered 2024-06-10: 500 mg via INTRAMUSCULAR
  Filled 2024-06-10: qty 10

## 2024-06-11 ENCOUNTER — Ambulatory Visit: Payer: Self-pay

## 2024-06-11 NOTE — Telephone Encounter (Signed)
 Per Dr. Melanee Creatinine is going up. Repeat bmp in 4 weeks and ask her to improve hydration please.  Outbound call to patient, informed of above.  Advised to drink 2L of water daily (patient confirmed not on a fluid restriction).  Patient has no preference in regards to day / time for lab only visit.  Forwarding to scheduling team to coordinate with patient.

## 2024-06-11 NOTE — Telephone Encounter (Signed)
-----   Message from Annah JAYSON Skene sent at 06/11/2024 12:57 AM EDT ----- Regarding: FW: Creatinine is going up. Repeat bmp in 4 weeks and ask her to improve hydration please ----- Message ----- From: Rebecka, Lab In [place] Sent: 06/10/2024  10:52 AM EDT To: Annah JAYSON Skene, MD

## 2024-06-14 ENCOUNTER — Encounter: Payer: Self-pay | Admitting: Oncology

## 2024-06-14 ENCOUNTER — Encounter: Payer: Self-pay | Admitting: Hematology and Oncology

## 2024-06-14 NOTE — Telephone Encounter (Signed)
 Lab appointment scheduled 08/11/24.  No further follow up needed at this time.

## 2024-06-24 ENCOUNTER — Other Ambulatory Visit: Payer: Self-pay | Admitting: Oncology

## 2024-06-24 ENCOUNTER — Other Ambulatory Visit: Payer: Self-pay | Admitting: Pharmacy Technician

## 2024-06-24 ENCOUNTER — Other Ambulatory Visit: Payer: Self-pay

## 2024-06-24 ENCOUNTER — Encounter (INDEPENDENT_AMBULATORY_CARE_PROVIDER_SITE_OTHER): Payer: Self-pay

## 2024-06-24 DIAGNOSIS — C50411 Malignant neoplasm of upper-outer quadrant of right female breast: Secondary | ICD-10-CM

## 2024-06-24 NOTE — Progress Notes (Signed)
 Specialty Pharmacy Refill Coordination Note  Megan Vega is a 68 y.o. female contacted today regarding refills of specialty medication(s) Abemaciclib  (VERZENIO )   Patient requested (Patient-Rptd) Delivery   Delivery date: 07/01/24 Verified address: (Patient-Rptd) [address] [place] [address]   Medication will be filled on 06/30/24.    RR sent to MD

## 2024-06-28 ENCOUNTER — Encounter: Payer: Self-pay | Admitting: Oncology

## 2024-06-28 ENCOUNTER — Other Ambulatory Visit: Payer: Self-pay

## 2024-06-28 ENCOUNTER — Encounter: Payer: Self-pay | Admitting: Hematology and Oncology

## 2024-06-28 MED ORDER — ABEMACICLIB 100 MG PO TABS
100.0000 mg | ORAL_TABLET | Freq: Two times a day (BID) | ORAL | 0 refills | Status: DC
  Filled 2024-06-28: qty 56, 28d supply, fill #0

## 2024-07-12 ENCOUNTER — Encounter: Payer: Self-pay | Admitting: Oncology

## 2024-07-12 ENCOUNTER — Inpatient Hospital Stay: Attending: Oncology

## 2024-07-12 DIAGNOSIS — C7951 Secondary malignant neoplasm of bone: Secondary | ICD-10-CM | POA: Insufficient documentation

## 2024-07-12 DIAGNOSIS — C50411 Malignant neoplasm of upper-outer quadrant of right female breast: Secondary | ICD-10-CM | POA: Diagnosis not present

## 2024-07-12 DIAGNOSIS — Z79818 Long term (current) use of other agents affecting estrogen receptors and estrogen levels: Secondary | ICD-10-CM | POA: Diagnosis not present

## 2024-07-12 MED ORDER — FULVESTRANT 250 MG/5ML IM SOSY
500.0000 mg | PREFILLED_SYRINGE | Freq: Once | INTRAMUSCULAR | Status: AC
Start: 2024-07-12 — End: 2024-07-12
  Administered 2024-07-12: 500 mg via INTRAMUSCULAR
  Filled 2024-07-12: qty 10

## 2024-07-21 ENCOUNTER — Other Ambulatory Visit: Payer: Self-pay | Admitting: Oncology

## 2024-07-21 ENCOUNTER — Other Ambulatory Visit: Payer: Self-pay | Admitting: Pharmacy Technician

## 2024-07-21 ENCOUNTER — Other Ambulatory Visit: Payer: Self-pay

## 2024-07-21 ENCOUNTER — Encounter (INDEPENDENT_AMBULATORY_CARE_PROVIDER_SITE_OTHER): Payer: Self-pay

## 2024-07-21 DIAGNOSIS — C50411 Malignant neoplasm of upper-outer quadrant of right female breast: Secondary | ICD-10-CM

## 2024-07-21 MED ORDER — ABEMACICLIB 100 MG PO TABS
100.0000 mg | ORAL_TABLET | Freq: Two times a day (BID) | ORAL | 0 refills | Status: DC
  Filled 2024-07-21: qty 56, 28d supply, fill #0

## 2024-07-21 NOTE — Progress Notes (Signed)
 Specialty Pharmacy Refill Coordination Note  Megan Vega is a 68 y.o. female contacted today regarding refills of specialty medication(s) Abemaciclib  (VERZENIO )   Patient requested (Patient-Rptd) Delivery   Delivery date: 07/29/24 Verified address: (Patient-Rptd) [address] [place] [address]   Medication will be filled on 07/28/24.

## 2024-07-28 ENCOUNTER — Other Ambulatory Visit: Payer: Self-pay

## 2024-07-28 ENCOUNTER — Other Ambulatory Visit (HOSPITAL_COMMUNITY): Payer: Self-pay

## 2024-08-06 ENCOUNTER — Ambulatory Visit: Payer: Medicare PPO | Admitting: Plastic Surgery

## 2024-08-11 ENCOUNTER — Inpatient Hospital Stay: Admitting: Oncology

## 2024-08-11 ENCOUNTER — Encounter: Payer: Self-pay | Admitting: Oncology

## 2024-08-11 ENCOUNTER — Inpatient Hospital Stay: Attending: Oncology

## 2024-08-11 ENCOUNTER — Inpatient Hospital Stay

## 2024-08-11 VITALS — BP 131/77 | HR 79

## 2024-08-11 VITALS — BP 112/72 | HR 96 | Temp 98.2°F | Resp 16 | Wt 160.0 lb

## 2024-08-11 DIAGNOSIS — Z17411 Hormone receptor positive with human epidermal growth factor receptor 2 negative status: Secondary | ICD-10-CM | POA: Insufficient documentation

## 2024-08-11 DIAGNOSIS — Z79818 Long term (current) use of other agents affecting estrogen receptors and estrogen levels: Secondary | ICD-10-CM

## 2024-08-11 DIAGNOSIS — C50411 Malignant neoplasm of upper-outer quadrant of right female breast: Secondary | ICD-10-CM

## 2024-08-11 DIAGNOSIS — C773 Secondary and unspecified malignant neoplasm of axilla and upper limb lymph nodes: Secondary | ICD-10-CM | POA: Diagnosis not present

## 2024-08-11 DIAGNOSIS — Z79899 Other long term (current) drug therapy: Secondary | ICD-10-CM | POA: Diagnosis not present

## 2024-08-11 DIAGNOSIS — C7951 Secondary malignant neoplasm of bone: Secondary | ICD-10-CM | POA: Diagnosis not present

## 2024-08-11 DIAGNOSIS — M545 Low back pain, unspecified: Secondary | ICD-10-CM | POA: Diagnosis not present

## 2024-08-11 DIAGNOSIS — Z7983 Long term (current) use of bisphosphonates: Secondary | ICD-10-CM

## 2024-08-11 DIAGNOSIS — C50911 Malignant neoplasm of unspecified site of right female breast: Secondary | ICD-10-CM

## 2024-08-11 DIAGNOSIS — Z5181 Encounter for therapeutic drug level monitoring: Secondary | ICD-10-CM

## 2024-08-11 DIAGNOSIS — D701 Agranulocytosis secondary to cancer chemotherapy: Secondary | ICD-10-CM

## 2024-08-11 DIAGNOSIS — Z5111 Encounter for antineoplastic chemotherapy: Secondary | ICD-10-CM | POA: Insufficient documentation

## 2024-08-11 LAB — CMP (CANCER CENTER ONLY)
ALT: 22 U/L (ref 0–44)
AST: 37 U/L (ref 15–41)
Albumin: 4.1 g/dL (ref 3.5–5.0)
Alkaline Phosphatase: 53 U/L (ref 38–126)
Anion gap: 10 (ref 5–15)
BUN: 13 mg/dL (ref 8–23)
CO2: 24 mmol/L (ref 22–32)
Calcium: 9.8 mg/dL (ref 8.9–10.3)
Chloride: 101 mmol/L (ref 98–111)
Creatinine: 1.24 mg/dL — ABNORMAL HIGH (ref 0.44–1.00)
GFR, Estimated: 47 mL/min — ABNORMAL LOW (ref >60)
Glucose, Bld: 105 mg/dL — ABNORMAL HIGH (ref 70–99)
Potassium: 3.8 mmol/L (ref 3.5–5.1)
Sodium: 135 mmol/L (ref 135–145)
Total Bilirubin: 1.2 mg/dL (ref 0.0–1.2)
Total Protein: 7.5 g/dL (ref 6.5–8.1)

## 2024-08-11 LAB — CBC WITH DIFFERENTIAL (CANCER CENTER ONLY)
Abs Immature Granulocytes: 0.01 K/uL (ref 0.00–0.07)
Basophils Absolute: 0.1 K/uL (ref 0.0–0.1)
Basophils Relative: 2 %
Eosinophils Absolute: 0.1 K/uL (ref 0.0–0.5)
Eosinophils Relative: 2 %
HCT: 36.1 % (ref 36.0–46.0)
Hemoglobin: 12.7 g/dL (ref 12.0–15.0)
Immature Granulocytes: 0 %
Lymphocytes Relative: 31 %
Lymphs Abs: 1.1 K/uL (ref 0.7–4.0)
MCH: 35.7 pg — ABNORMAL HIGH (ref 26.0–34.0)
MCHC: 35.2 g/dL (ref 30.0–36.0)
MCV: 101.4 fL — ABNORMAL HIGH (ref 80.0–100.0)
Monocytes Absolute: 0.4 K/uL (ref 0.1–1.0)
Monocytes Relative: 10 %
Neutro Abs: 1.9 K/uL (ref 1.7–7.7)
Neutrophils Relative %: 55 %
Platelet Count: 170 K/uL (ref 150–400)
RBC: 3.56 MIL/uL — ABNORMAL LOW (ref 3.87–5.11)
RDW: 12.5 % (ref 11.5–15.5)
WBC Count: 3.5 K/uL — ABNORMAL LOW (ref 4.0–10.5)
nRBC: 0 % (ref 0.0–0.2)

## 2024-08-11 MED ORDER — SODIUM CHLORIDE 0.9 % IV SOLN
Freq: Once | INTRAVENOUS | Status: AC
  Filled 2024-08-11: qty 250

## 2024-08-11 MED ORDER — FULVESTRANT 250 MG/5ML IM SOSY
500.0000 mg | PREFILLED_SYRINGE | Freq: Once | INTRAMUSCULAR | Status: AC
  Administered 2024-08-11: 500 mg via INTRAMUSCULAR
  Filled 2024-08-11: qty 10

## 2024-08-11 MED ORDER — ZOLEDRONIC ACID 4 MG/5ML IV CONC
3.5000 mg | INTRAVENOUS | Status: DC
  Administered 2024-08-11: 3.5 mg via INTRAVENOUS
  Filled 2024-08-11: qty 4.38

## 2024-08-11 NOTE — Progress Notes (Signed)
 Hematology/Oncology Consult note [facility]  Telephone:([phone] [phone]  Patient Care Team: Dineen Rollene MATSU, FNP as PCP - General (Family Medicine) Cindie Jesusa HERO, RN as Oncology Nurse Navigator Byrnett, Reyes ORN, MD (General Surgery) Maribeth Camellia MATSU, MD (Inactive) as Consulting Physician (Family Medicine) Maree Jannett POUR, MD as Consulting Physician (Neurology) Melanee Annah BROCKS, MD as Consulting Physician (Oncology) Lenn Aran, MD as Consulting Physician (Radiation Oncology)   Name of the patient: Megan Vega  [id number]  [birth year]   Date of visit: 08/11/24  Diagnosis- history of ER/PR positive HER2 negative breast cancer now with isolated L5 bone metastases stage III right breast cancer and left breast DCIS in 2019   Chief complaint/ Reason for visit-routine follow-up of ER positive metastatic breast cancer with isolated L5 metastases presently on Faslodex  plus Verzenio   Heme/Onc history:  Megan Vega is a 68 y.o. female with clinical stage [id number] right breast cancer and DCIS in the left breast s/p neoadjuvant chemotherapy followed by left simple mastectomy and right modified radical mastectomy on 07/10/2018.  Biopsy showed 1.4 cm grade 2 invasive mammary carcinoma ER greater than 90% positive PR greater than 90% positive and HER2 negative +1   Patient received neoadjuvant AC Taxol  chemotherapy until July 2019.   Left breast pathology revealed 2.8 cm residual high grade DCIS, comedo type.  There was atypical lobular hyperplasia.  Four sentinel lymph nodes were negative.  Right breast pathology revealed 3.6 cm residual grade II invasive mammary carcinoma and high grade DCIS.  There was lymphovascular invasion.There was metastatic carcinoma in 2 sentinel lymph nodes.  Right axillary dissection revealed metastatic carcinoma in 7 lymph nodes.  There were a total of 9 lymph nodes with macrometastasis (largest 22 mm).   Pathologic stage was ypT2 ypN2a.   Patient then received adjuvant radiation treatment and started letrozole  in February 2020   She underwent right breast reconstruction with latissimus myocutaneous flap and removal of her port-a-cath on 02/07/2020.  Expanders were taken out in 05/2020 secondary to infection.  She underwent excision of left breast wound with closure on 10/02/2020. Pathology revealed inflamed granulation tissue and fibrosis. There was no evidence of malignancy.  She received adjuvant Zometa  for 3 years   Patient found to have L5 vertebral lesion that was hypermetabolic on PET scan.  Biopsies negative for malignancy.  There was no lesions seen elsewhere.  A repeat scans in May 2024 again confirmed the presence of L5 vertebral lesion and patient had a second biopsy with osteo-cool procedure/kyphoplasty.Biopsy was consistent with metastatic adenocarcinoma compatible with breast primary.  ER 100% positive, PR 5% positive and HER2 negative.  Not enough tissue available for NGS testing.  Patient started on Faslodex  plus Verzenio  in July 2024  Interval history-patient reports that over the last 2 months she is having increasing low back pain as well as left hip pain more than usual.  ECOG PS- 0 Pain scale- 4 Opioid associated constipation- no  Review of systems- Review of Systems  Constitutional:  Negative for chills, fever, malaise/fatigue and weight loss.  HENT:  Negative for congestion, ear discharge and nosebleeds.   Eyes:  Negative for blurred vision.  Respiratory:  Negative for cough, hemoptysis, sputum production, shortness of breath and wheezing.   Cardiovascular:  Negative for chest pain, palpitations, orthopnea and claudication.  Gastrointestinal:  Negative for abdominal pain, blood in stool, constipation, diarrhea, heartburn, melena, nausea and vomiting.  Genitourinary:  Negative for dysuria, flank pain, frequency, hematuria and urgency.  Musculoskeletal:  Positive for back  pain. Negative for joint pain (Left hip pain) and myalgias.  Skin:  Negative for rash.  Neurological:  Negative for dizziness, tingling, focal weakness, seizures, weakness and headaches.  Endo/Heme/Allergies:  Does not bruise/bleed easily.  Psychiatric/Behavioral:  Negative for depression and suicidal ideas. The patient does not have insomnia.       Allergies  Allergen Reactions   Peanut-Containing Drug Products Swelling and Other (See Comments)    Only when eating too many peanuts, swelling with lips and tingly face     Past Medical History:  Diagnosis Date   Ankle fracture    Breast cancer (HCC)    bilateral   Cancer (HCC)    Basal Cell Carcinoma, about 2011 chest   Cataract    Family history of adverse reaction to anesthesia    daughter with PONV   Hemorrhoids    History of methicillin resistant staphylococcus aureus (MRSA) 2015   Migraines    MIGRAINES occ   Personal history of chemotherapy    Personal history of radiation therapy    Wears glasses      Past Surgical History:  Procedure Laterality Date   BREAST BIOPSY Bilateral 10/29/2017   L breast DCIS, R breast invasive mammary carcinoma of no special type, ER/PR+, Her2/neu 1+   BREAST RECONSTRUCTION Right 02/07/2020   Procedure: BREAST RECONSTRUCTION;  Surgeon: Lowery Estefana RAMAN, DO;  Location: [facility];  Service: Plastics;  Laterality: Right;   BREAST RECONSTRUCTION WITH PLACEMENT OF TISSUE EXPANDER AND FLEX HD (ACELLULAR HYDRATED DERMIS) Bilateral 08/30/2019   Procedure: BREAST RECONSTRUCTION WITH PLACEMENT OF TISSUE EXPANDER AND FLEX HD (ACELLULAR HYDRATED DERMIS);  Surgeon: Lowery Estefana RAMAN, DO;  Location: [facility];  Service: Plastics;  Laterality: Bilateral;  2.5 hours, please   CESAREAN SECTION     COLONOSCOPY  2012   COLONOSCOPY WITH PROPOFOL  N/A 12/01/2018   Procedure: COLONOSCOPY WITH PROPOFOL ;  Surgeon: Jinny Carmine, MD;  Location: [facility];  Service: Endoscopy;  Laterality: N/A;   DEBRIDEMENT  AND CLOSURE WOUND Bilateral 10/02/2020   Procedure: Excision of bilateral breast wound;  Surgeon: Lowery Estefana RAMAN, DO;  Location: [facility];  Service: Plastics;  Laterality: Bilateral;  1.5 hours, please   FRACTURE SURGERY     ankle   IR CT BONE TROCAR/NEEDLE BIOPSY DEEP  12/26/2022   IR KYPHO LUMBAR INC FX REDUCE BONE BX UNI/BIL CANNULATION INC/IMAGING  05/14/2023   IR RADIOLOGIST EVAL & MGMT  04/28/2023   IR RADIOLOGIST EVAL & MGMT  06/27/2023   IRRIGATION AND DEBRIDEMENT ABSCESS Right 03/12/2018   Procedure: IRRIGATION AND DEBRIDEMENT RIGHT AXILLARY ABSCESS;  Surgeon: Dessa Reyes ORN, MD;  Location: [facility];  Service: General;  Laterality: Right;   LATISSIMUS FLAP TO BREAST Right 02/07/2020   Procedure: LATISSIMUS MYOCUTANEOUS FLAP TO BREAST;  Surgeon: Lowery Estefana RAMAN, DO;  Location: [facility];  Service: Plastics;  Laterality: Right;  3 hours   MANDIBLE FRACTURE SURGERY     MASTECTOMY Bilateral    2019   MASTECTOMY MODIFIED RADICAL Right 07/10/2018   ypT2 ypN2a ER/PR positive, HER-2/neu negative  Surgeon: Dessa Reyes ORN, MD;  Location: [facility];  Service: General;  Laterality: Right;   PORT-A-CATH REMOVAL Left 02/07/2020   Procedure: REMOVAL PORT-A-CATH;  Surgeon: Lowery Estefana RAMAN, DO;  Location: [facility];  Service: Plastics;  Laterality: Left;   PORTACATH PLACEMENT Left 11/28/2017   Procedure: INSERTION PORT-A-CATH;  Surgeon: Dessa Reyes ORN, MD;  Location: [facility];  Service: General;  Laterality: Left;   REMOVAL OF TISSUE EXPANDER Right 06/05/2020   Procedure: TISSUE EXPANDER REMOVAL;  Surgeon: Lowery Estefana RAMAN, DO;  Location: [facility];  Service: Plastics;  Laterality: Right;   SIMPLE MASTECTOMY WITH AXILLARY SENTINEL NODE BIOPSY Left 07/10/2018   High-grade DCIS SIMPLE MASTECTOMY;  Surgeon: Dessa Reyes ORN, MD;  Location: [facility];  Service: General;  Laterality: Left;   TISSUE EXPANDER PLACEMENT Left 06/05/2020   Procedure: TISSUE EXPANDER  FLUID REMOVAL-100ML;  Surgeon: Lowery Estefana RAMAN, DO;  Location: [facility];  Service: Plastics;  Laterality: Left;   TISSUE EXPANDER PLACEMENT Left 06/14/2020   Procedure: TISSUE EXPANDER REMOVAL;  Surgeon: Lowery Estefana RAMAN, DO;  Location: [facility];  Service: Plastics;  Laterality: Left;  45 min, please    Social History   Socioeconomic History   Marital status: Divorced    Spouse name: Not on file   Number of children: 2   Years of education: Not on file   Highest education level: 12th grade  Occupational History   Occupation: Retired  Tobacco Use   Smoking status: Former    Current packs/day: 0.00    Average packs/day: 0.3 packs/day for 10.0 years (2.5 ttl pk-yrs)    Types: Cigarettes    Start date: 1968    Quit date: 1978    Years since quitting: 47.7   Smokeless tobacco: Never  Vaping Use   Vaping status: Never Used  Substance and Sexual Activity   Alcohol use: Yes    Alcohol/week: 1.0 standard drink of alcohol    Types: 1 Glasses of wine per week    Comment:  occasional glass of wine    Drug use: No   Sexual activity: Not Currently    Birth control/protection: Post-menopausal  Other Topics Concern   Not on file  Social History Narrative   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    Lives at home (son 75 yo lives with her)   Children 2   Pets: 3 small dogs and 1 frog    Caffeine- 1 cup of coffee in the morning, rare tea   Social Drivers of Health   Financial Resource Strain: Medium Risk (04/03/2024)   Overall Financial Resource Strain (CARDIA)    Difficulty of Paying Living Expenses: Somewhat hard  Food Insecurity: Food Insecurity Present (04/07/2024)   Hunger Vital Sign    Worried About Running Out of Food in the Last Year: Sometimes true    Ran Out of Food in the Last Year: Never true  Transportation Needs: No Transportation Needs (04/03/2024)   PRAPARE - Administrator, Civil Service (Medical): No    Lack of Transportation (Non-Medical): No   Physical Activity: Sufficiently Active (04/03/2024)   Exercise Vital Sign    Days of Exercise per Week: 7 days    Minutes of Exercise per Session: 30 min  Stress: No Stress Concern Present (04/03/2024)   [employer] of Occupational Health - Occupational Stress Questionnaire    Feeling of Stress : Not at all  Social Connections: Moderately Isolated (04/03/2024)   Social Connection and Isolation Panel    Frequency of Communication with Friends and Family: More than three times a week    Frequency of Social Gatherings with Friends and Family: More than three times a week    Attends Religious Services: More than 4 times per year    Active Member of [employer] or Organizations: No    Attends Banker Meetings: Never    Marital  Status: Divorced  Catering manager Violence: Not At Risk (04/07/2024)   Humiliation, Afraid, Rape, and Kick questionnaire    Fear of Current or Ex-Partner: No    Emotionally Abused: No    Physically Abused: No    Sexually Abused: No    Family History  Problem Relation Age of Onset   Cancer Mother        Esophageal Cancer   Early death Mother        Age 66   Cancer Father        Lung Cancer   Diabetes Father    Diabetes Brother        Controlled with diet   Heart attack Paternal Grandfather    Colon cancer Neg Hx      Current Outpatient Medications:    abemaciclib  (VERZENIO ) 100 MG tablet, Take 1 tablet (100 mg total) by mouth 2 (two) times daily., Disp: 56 tablet, Rfl: 0   b complex vitamins tablet, Take 1 tablet by mouth daily., Disp: , Rfl:    Calcium -Magnesium-Vitamin D  (CALCIUM  1200+D3 PO), Take 1,200 mg by mouth daily. , Disp: , Rfl:    diphenoxylate -atropine  (LOMOTIL ) 2.5-0.025 MG tablet, Take 2 tablets by mouth 4 (four) times daily as needed for diarrhea or loose stools., Disp: 30 tablet, Rfl: 1   loperamide  (IMODIUM ) 2 MG capsule, Take 2 tabs by mouth with first loose stool, then 1 tab with each additional loose stool as needed. Do not  exceed 8 tabs in a 24-hour period, Disp: 60 capsule, Rfl: 2   loratadine (CLARITIN) 10 MG tablet, Take 10 mg by mouth every morning. , Disp: , Rfl:    Melatonin 5 MG CAPS, Take 5 mg by mouth daily as needed (sleep)., Disp: , Rfl:    Multiple Vitamin (MULTIVITAMIN WITH MINERALS) TABS tablet, Take 1 tablet by mouth daily. Centrum Silver, Disp: , Rfl:    Naphazoline HCl (CLEAR EYES OP), Place 1 drop into both eyes daily., Disp: , Rfl:    ondansetron  (ZOFRAN ) 8 MG tablet, Take 1 tablet (8 mg total) by mouth every 8 (eight) hours as needed for nausea or vomiting., Disp: 15 tablet, Rfl: 0   Probiotic Product (PROBIOTIC PO), Take 1 capsule by mouth daily., Disp: , Rfl:    prochlorperazine  (COMPAZINE ) 10 MG tablet, Take 1 tablet (10 mg total) by mouth every 6 (six) hours as needed for nausea or vomiting., Disp: 30 tablet, Rfl: 1   traMADol  (ULTRAM ) 50 MG tablet, Take 1 tablet (50 mg total) by mouth every 6 (six) hours as needed for moderate pain. (Patient not taking: Reported on 05/11/2024), Disp: 10 tablet, Rfl: 0 No current facility-administered medications for this visit.  Facility-Administered Medications Ordered in Other Visits:    heparin  lock flush 100 unit/mL, 500 Units, Intracatheter, PRN, Corcoran, Melissa C, MD  Physical exam:  Vitals:   08/11/24 1353  BP: 112/72  Pulse: 96  Resp: 16  Temp: 98.2 F (36.8 C)  TempSrc: Tympanic  SpO2: 100%  Weight: 160 lb (72.6 kg)   Physical Exam Cardiovascular:     Rate and Rhythm: Normal rate and regular rhythm.     Heart sounds: Normal heart sounds.  Pulmonary:     Effort: Pulmonary effort is normal.     Breath sounds: Normal breath sounds.  Abdominal:     General: Bowel sounds are normal.     Palpations: Abdomen is soft.  Skin:    General: Skin is warm and dry.  Neurological:  Mental Status: She is alert and oriented to person, place, and time.      I have personally reviewed labs listed below:    Latest Ref Rng & Units 08/11/2024     1:37 PM  CMP  Glucose 70 - 99 mg/dL 894   BUN 8 - 23 mg/dL 13   Creatinine 9.55 - 1.00 mg/dL 8.75   Sodium 864 - 854 mmol/L 135   Potassium 3.5 - 5.1 mmol/L 3.8   Chloride 98 - 111 mmol/L 101   CO2 22 - 32 mmol/L 24   Calcium  8.9 - 10.3 mg/dL 9.8   Total Protein 6.5 - 8.1 g/dL 7.5   Total Bilirubin 0.0 - 1.2 mg/dL 1.2   Alkaline Phos 38 - 126 U/L 53   AST 15 - 41 U/L 37   ALT 0 - 44 U/L 22       Latest Ref Rng & Units 08/11/2024    1:37 PM  CBC  WBC 4.0 - 10.5 K/uL 3.5   Hemoglobin 12.0 - 15.0 g/dL 87.2   Hematocrit 63.9 - 46.0 % 36.1   Platelets 150 - 400 K/uL 170       Assessment and plan- Patient is a 68 y.o. female with history of metastatic ER positive breast cancer with isolated L5 metastases s/p kyphoplasty and palliative radiation presently on Verzenio  plus Faslodex  here for routine follow-up  Counts are okay to stay on Verzenio  100 mg twice daily until progression or toxicity.  She will also remain on monthly Faslodex .  Tumor markers in her case have not been helpful since they were normal even when she had isolated L5 metastases.  Given her worsening low back pain and left hip pain I will start with a bone scan at this time.  If bone scan does not reveal any evidence of malignancy I will consider getting an MRI of her spine  I will see her back in 3 months with CBC with differential CMP CA 27-29 and CA 15-3  Patient gets Zometa  every 6 months and will get her next dose today   Visit Diagnosis 1. Primary cancer of upper outer quadrant of right breast (HCC)   2. Encounter for monitoring zoledronic  acid therapy   3. High risk medication use   4. Use of fulvestrant  (Faslodex )   5. Lumbar pain      Dr. Annah Skene, MD, MPH [facility] at [facility] [phone] 08/11/2024 2:21 PM

## 2024-08-12 LAB — CANCER ANTIGEN 27.29: CA 27.29: 14.7 U/mL (ref 0.0–38.6)

## 2024-08-18 ENCOUNTER — Other Ambulatory Visit (HOSPITAL_COMMUNITY): Payer: Self-pay

## 2024-08-18 ENCOUNTER — Other Ambulatory Visit: Payer: Self-pay

## 2024-08-18 ENCOUNTER — Other Ambulatory Visit: Payer: Self-pay | Admitting: Oncology

## 2024-08-18 DIAGNOSIS — C50411 Malignant neoplasm of upper-outer quadrant of right female breast: Secondary | ICD-10-CM

## 2024-08-18 MED ORDER — ABEMACICLIB 100 MG PO TABS
100.0000 mg | ORAL_TABLET | Freq: Two times a day (BID) | ORAL | 0 refills | Status: DC
  Filled 2024-08-18 – 2024-08-20 (×2): qty 56, 28d supply, fill #0

## 2024-08-20 ENCOUNTER — Other Ambulatory Visit: Payer: Self-pay

## 2024-08-20 NOTE — Progress Notes (Signed)
 Specialty Pharmacy Refill Coordination Note  Megan Vega is a 68 y.o. female contacted today regarding refills of specialty medication(s) Abemaciclib  (VERZENIO )   Patient requested Delivery   Delivery date: 08/24/24   Verified address: [address]   [place] [address]   Medication will be filled on 08/23/24.

## 2024-08-23 ENCOUNTER — Ambulatory Visit
Admission: RE | Admit: 2024-08-23 | Discharge: 2024-08-23 | Disposition: A | Discharge status: Home / Self Care | Source: Ambulatory Visit | Attending: Oncology | Admitting: Oncology

## 2024-08-23 DIAGNOSIS — C50411 Malignant neoplasm of upper-outer quadrant of right female breast: Secondary | ICD-10-CM | POA: Insufficient documentation

## 2024-08-23 DIAGNOSIS — M545 Low back pain, unspecified: Secondary | ICD-10-CM | POA: Insufficient documentation

## 2024-08-23 MED ORDER — TECHNETIUM TC 99M MEDRONATE IV KIT
20.0000 | PACK | Freq: Once | INTRAVENOUS | Status: AC | PRN
  Administered 2024-08-23: 22.42 via INTRAVENOUS

## 2024-08-27 ENCOUNTER — Ambulatory Visit: Payer: Self-pay | Admitting: Oncology

## 2024-08-27 NOTE — Telephone Encounter (Signed)
 Per Dr. Melanee Please let her know that her bone scan does not show any convincing areas of new bone mets. If she continues to have back pain I will consider to getting an MRI to evaluate the area of L5 vertebral body better.  Outbound call; spoke to patient and informed of above.

## 2024-08-27 NOTE — Telephone Encounter (Signed)
-----   Message from Annah JAYSON Skene sent at 08/27/2024 11:25 AM EDT ----- Please let her know that her bone scan does not show any convincing areas of new bone mets.  If she continues to have back pain I will consider to getting an MRI to evaluate the area of L5 vertebral  body better ----- Message ----- From: Interface, Rad Results In Sent: 08/25/2024   4:54 PM EDT To: Annah JAYSON Skene, MD

## 2024-09-07 ENCOUNTER — Encounter: Payer: Self-pay | Admitting: Oncology

## 2024-09-08 ENCOUNTER — Inpatient Hospital Stay: Attending: Oncology

## 2024-09-08 DIAGNOSIS — C50411 Malignant neoplasm of upper-outer quadrant of right female breast: Secondary | ICD-10-CM

## 2024-09-08 DIAGNOSIS — Z170 Estrogen receptor positive status [ER+]: Secondary | ICD-10-CM | POA: Diagnosis not present

## 2024-09-08 DIAGNOSIS — C773 Secondary and unspecified malignant neoplasm of axilla and upper limb lymph nodes: Secondary | ICD-10-CM | POA: Diagnosis not present

## 2024-09-08 DIAGNOSIS — C50911 Malignant neoplasm of unspecified site of right female breast: Secondary | ICD-10-CM | POA: Diagnosis not present

## 2024-09-08 DIAGNOSIS — C7951 Secondary malignant neoplasm of bone: Secondary | ICD-10-CM | POA: Diagnosis not present

## 2024-09-08 DIAGNOSIS — Z5111 Encounter for antineoplastic chemotherapy: Secondary | ICD-10-CM | POA: Insufficient documentation

## 2024-09-08 MED ORDER — FULVESTRANT 250 MG/5ML IM SOSY
500.0000 mg | PREFILLED_SYRINGE | Freq: Once | INTRAMUSCULAR | Status: AC
  Administered 2024-09-08: 500 mg via INTRAMUSCULAR
  Filled 2024-09-08: qty 10

## 2024-09-13 ENCOUNTER — Other Ambulatory Visit: Payer: Self-pay | Admitting: Oncology

## 2024-09-13 ENCOUNTER — Other Ambulatory Visit (HOSPITAL_COMMUNITY): Payer: Self-pay

## 2024-09-13 ENCOUNTER — Other Ambulatory Visit: Payer: Self-pay

## 2024-09-13 ENCOUNTER — Encounter (INDEPENDENT_AMBULATORY_CARE_PROVIDER_SITE_OTHER): Payer: Self-pay

## 2024-09-13 DIAGNOSIS — C50411 Malignant neoplasm of upper-outer quadrant of right female breast: Secondary | ICD-10-CM

## 2024-09-14 ENCOUNTER — Other Ambulatory Visit (HOSPITAL_COMMUNITY): Payer: Self-pay

## 2024-09-14 ENCOUNTER — Encounter: Payer: Self-pay | Admitting: Oncology

## 2024-09-14 ENCOUNTER — Encounter: Payer: Self-pay | Admitting: Hematology and Oncology

## 2024-09-14 ENCOUNTER — Other Ambulatory Visit: Payer: Self-pay

## 2024-09-14 MED ORDER — ABEMACICLIB 100 MG PO TABS
100.0000 mg | ORAL_TABLET | Freq: Two times a day (BID) | ORAL | 0 refills | Status: DC
  Filled 2024-09-14: qty 56, 28d supply, fill #0

## 2024-09-14 NOTE — Progress Notes (Signed)
 Specialty Pharmacy Refill Coordination Note  Megan Vega is a 68 y.o. female contacted today regarding refills of specialty medication(s) Abemaciclib  (VERZENIO )   Patient requested Delivery   Delivery date: 09/24/24   Verified address: [address] [place] [address]   Medication will be filled on: 09/23/24   This fill date is pending response to refill request from provider. Patient is aware and if they have not received fill by intended date they must follow up with pharmacy.

## 2024-09-23 ENCOUNTER — Telehealth: Payer: Self-pay | Admitting: Pharmacy Technician

## 2024-09-23 ENCOUNTER — Encounter: Payer: Self-pay | Admitting: Oncology

## 2024-09-23 ENCOUNTER — Encounter: Payer: Self-pay | Admitting: Hematology and Oncology

## 2024-09-23 ENCOUNTER — Other Ambulatory Visit (HOSPITAL_COMMUNITY): Payer: Self-pay

## 2024-09-23 ENCOUNTER — Other Ambulatory Visit: Payer: Self-pay

## 2024-09-23 NOTE — Telephone Encounter (Signed)
 Oral Oncology Patient Advocate Encounter  Was successful in securing patient a $7500 grant from [facility] to provide copayment coverage for Verzenio .  This will keep the out of pocket expense at $0.     [employer] ID: [id number]   The billing information is as follows and has been shared with [facility].    RxBin: [id number] PCN: [id number] Member ID: [id number] Group ID: [id number] Dates of Eligibility: 08/24/2024 through 08/23/2025  Fund:  Breast Cancer - Medicare Access  Griffith (Patty) Chet Burnet, CPhT  [facility], Zelda Salmon, [facility] - Oral Chemotherapy Patient Advocate Specialist III Phone: [phone]  Fax: [phone]

## 2024-09-24 ENCOUNTER — Other Ambulatory Visit (HOSPITAL_COMMUNITY): Payer: Self-pay

## 2024-10-07 ENCOUNTER — Inpatient Hospital Stay: Attending: Oncology

## 2024-10-07 DIAGNOSIS — Z5111 Encounter for antineoplastic chemotherapy: Secondary | ICD-10-CM | POA: Diagnosis not present

## 2024-10-07 DIAGNOSIS — M549 Dorsalgia, unspecified: Secondary | ICD-10-CM | POA: Diagnosis not present

## 2024-10-07 DIAGNOSIS — Z1732 Human epidermal growth factor receptor 2 negative status: Secondary | ICD-10-CM | POA: Diagnosis not present

## 2024-10-07 DIAGNOSIS — Z170 Estrogen receptor positive status [ER+]: Secondary | ICD-10-CM | POA: Diagnosis not present

## 2024-10-07 DIAGNOSIS — C50411 Malignant neoplasm of upper-outer quadrant of right female breast: Secondary | ICD-10-CM | POA: Diagnosis not present

## 2024-10-07 DIAGNOSIS — Z79899 Other long term (current) drug therapy: Secondary | ICD-10-CM | POA: Diagnosis not present

## 2024-10-07 DIAGNOSIS — Z87891 Personal history of nicotine dependence: Secondary | ICD-10-CM | POA: Diagnosis not present

## 2024-10-07 DIAGNOSIS — Z1721 Progesterone receptor positive status: Secondary | ICD-10-CM | POA: Diagnosis not present

## 2024-10-07 DIAGNOSIS — Z7289 Other problems related to lifestyle: Secondary | ICD-10-CM | POA: Diagnosis not present

## 2024-10-07 MED ORDER — FULVESTRANT 250 MG/5ML IM SOSY
500.0000 mg | PREFILLED_SYRINGE | Freq: Once | INTRAMUSCULAR | Status: AC
  Administered 2024-10-07: 500 mg via INTRAMUSCULAR
  Filled 2024-10-07: qty 10

## 2024-10-15 ENCOUNTER — Other Ambulatory Visit (HOSPITAL_COMMUNITY): Payer: Self-pay

## 2024-10-15 ENCOUNTER — Other Ambulatory Visit: Payer: Self-pay | Admitting: Oncology

## 2024-10-15 DIAGNOSIS — C50411 Malignant neoplasm of upper-outer quadrant of right female breast: Secondary | ICD-10-CM

## 2024-10-18 ENCOUNTER — Encounter: Payer: Self-pay | Admitting: Hematology and Oncology

## 2024-10-18 ENCOUNTER — Other Ambulatory Visit: Payer: Self-pay

## 2024-10-18 ENCOUNTER — Other Ambulatory Visit: Payer: Self-pay | Admitting: Pharmacy Technician

## 2024-10-18 ENCOUNTER — Encounter: Payer: Self-pay | Admitting: Oncology

## 2024-10-18 ENCOUNTER — Other Ambulatory Visit (HOSPITAL_COMMUNITY): Payer: Self-pay

## 2024-10-18 MED ORDER — ABEMACICLIB 100 MG PO TABS
100.0000 mg | ORAL_TABLET | Freq: Two times a day (BID) | ORAL | 0 refills | Status: AC
  Filled 2024-10-18 (×2): qty 56, 28d supply, fill #0

## 2024-10-18 NOTE — Progress Notes (Signed)
 Specialty Pharmacy Refill Coordination Note  Megan Vega is a 68 y.o. female contacted today regarding refills of specialty medication(s) Abemaciclib  (VERZENIO )   Patient requested Delivery Delivery date: 10/28/2024 Verified address: [address] [place] [address]   Medication will be filled on: 10/27/2024

## 2024-10-27 ENCOUNTER — Other Ambulatory Visit: Payer: Self-pay

## 2024-11-05 ENCOUNTER — Other Ambulatory Visit: Attending: Oncology

## 2024-11-05 ENCOUNTER — Ambulatory Visit: Admitting: Nurse Practitioner

## 2024-11-05 ENCOUNTER — Other Ambulatory Visit: Payer: Self-pay

## 2024-11-05 ENCOUNTER — Other Ambulatory Visit (HOSPITAL_COMMUNITY): Payer: Self-pay

## 2024-11-05 ENCOUNTER — Encounter: Payer: Self-pay | Admitting: Nurse Practitioner

## 2024-11-05 ENCOUNTER — Inpatient Hospital Stay: Attending: Oncology

## 2024-11-05 VITALS — BP 117/80 | HR 87 | Temp 97.6°F | Resp 18 | Ht 65.0 in | Wt 143.0 lb

## 2024-11-05 DIAGNOSIS — Z1732 Human epidermal growth factor receptor 2 negative status: Secondary | ICD-10-CM | POA: Diagnosis not present

## 2024-11-05 DIAGNOSIS — Z87891 Personal history of nicotine dependence: Secondary | ICD-10-CM | POA: Insufficient documentation

## 2024-11-05 DIAGNOSIS — C7951 Secondary malignant neoplasm of bone: Secondary | ICD-10-CM | POA: Insufficient documentation

## 2024-11-05 DIAGNOSIS — C50411 Malignant neoplasm of upper-outer quadrant of right female breast: Secondary | ICD-10-CM

## 2024-11-05 DIAGNOSIS — Z170 Estrogen receptor positive status [ER+]: Secondary | ICD-10-CM | POA: Insufficient documentation

## 2024-11-05 DIAGNOSIS — Z5111 Encounter for antineoplastic chemotherapy: Secondary | ICD-10-CM | POA: Diagnosis present

## 2024-11-05 DIAGNOSIS — Z1721 Progesterone receptor positive status: Secondary | ICD-10-CM | POA: Insufficient documentation

## 2024-11-05 DIAGNOSIS — C773 Secondary and unspecified malignant neoplasm of axilla and upper limb lymph nodes: Secondary | ICD-10-CM | POA: Diagnosis not present

## 2024-11-05 DIAGNOSIS — R937 Abnormal findings on diagnostic imaging of other parts of musculoskeletal system: Secondary | ICD-10-CM

## 2024-11-05 LAB — CBC WITH DIFFERENTIAL (CANCER CENTER ONLY)
Abs Immature Granulocytes: 0.01 K/uL (ref 0.00–0.07)
Basophils Absolute: 0.1 K/uL (ref 0.0–0.1)
Basophils Relative: 3 %
Eosinophils Absolute: 0.1 K/uL (ref 0.0–0.5)
Eosinophils Relative: 3 %
HCT: 36.4 % (ref 36.0–46.0)
Hemoglobin: 12.6 g/dL (ref 12.0–15.0)
Immature Granulocytes: 0 %
Lymphocytes Relative: 29 %
Lymphs Abs: 0.8 K/uL (ref 0.7–4.0)
MCH: 35.2 pg — ABNORMAL HIGH (ref 26.0–34.0)
MCHC: 34.6 g/dL (ref 30.0–36.0)
MCV: 101.7 fL — ABNORMAL HIGH (ref 80.0–100.0)
Monocytes Absolute: 0.4 K/uL (ref 0.1–1.0)
Monocytes Relative: 13 %
Neutro Abs: 1.5 K/uL — ABNORMAL LOW (ref 1.7–7.7)
Neutrophils Relative %: 52 %
Platelet Count: 163 K/uL (ref 150–400)
RBC: 3.58 MIL/uL — ABNORMAL LOW (ref 3.87–5.11)
RDW: 11.7 % (ref 11.5–15.5)
WBC Count: 2.9 K/uL — ABNORMAL LOW (ref 4.0–10.5)
nRBC: 0 % (ref 0.0–0.2)

## 2024-11-05 LAB — CMP (CANCER CENTER ONLY)
ALT: 25 U/L (ref 0–44)
AST: 38 U/L (ref 15–41)
Albumin: 4.1 g/dL (ref 3.5–5.0)
Alkaline Phosphatase: 57 U/L (ref 38–126)
Anion gap: 12 (ref 5–15)
BUN: 12 mg/dL (ref 8–23)
CO2: 24 mmol/L (ref 22–32)
Calcium: 9.8 mg/dL (ref 8.9–10.3)
Chloride: 104 mmol/L (ref 98–111)
Creatinine: 1.17 mg/dL — ABNORMAL HIGH (ref 0.44–1.00)
GFR, Estimated: 51 mL/min — ABNORMAL LOW
Glucose, Bld: 98 mg/dL (ref 70–99)
Potassium: 3.8 mmol/L (ref 3.5–5.1)
Sodium: 139 mmol/L (ref 135–145)
Total Bilirubin: 0.6 mg/dL (ref 0.0–1.2)
Total Protein: 7.1 g/dL (ref 6.5–8.1)

## 2024-11-05 MED ORDER — ABEMACICLIB 50 MG PO TABS
50.0000 mg | ORAL_TABLET | Freq: Two times a day (BID) | ORAL | 0 refills | Status: DC
  Filled 2024-11-05: qty 56, 28d supply, fill #0

## 2024-11-05 MED ORDER — FULVESTRANT 250 MG/5ML IM SOSY
500.0000 mg | PREFILLED_SYRINGE | Freq: Once | INTRAMUSCULAR | Status: AC
  Administered 2024-11-05: 500 mg via INTRAMUSCULAR

## 2024-11-05 NOTE — Progress Notes (Signed)
 Specialty Pharmacy Refill Coordination Note  Spoke with Giavanni Zeitlin  Megan Vega is a 68 y.o. female contacted today regarding refills of specialty medication(s) Abemaciclib  (VERZENIO )  Doses on hand: 0 of this dose (dose decrease)  Patient requested: Delivery   Delivery date: 11/09/24   Verified address: [address] [place] [address]  Medication will be filled on 11/08/24  Patient inquired about taking 100 mg daily of her current supply of Verzenio . Per pharmacist Blanchard), this dosage is appropriate and safe. Patient may initiate this new dosage upon receiving medication on Tuesday, 11/09/24.

## 2024-11-05 NOTE — Progress Notes (Signed)
 "    Hematology/Oncology Consult note [facility]  [phone] [phone]  Patient Care Team: Dineen Rollene MATSU, FNP as PCP - General (Family Medicine) Cindie Jesusa HERO, RN as Oncology Nurse Navigator Byrnett, Reyes ORN, MD (General Surgery) Maribeth Camellia MATSU, MD (Inactive) as Consulting Physician (Family Medicine) Maree Jannett POUR, MD as Consulting Physician (Neurology) Melanee Annah BROCKS, MD as Consulting Physician (Oncology) Lenn Aran, MD as Consulting Physician (Radiation Oncology)   Name of the patient: Megan Vega  [id number]  10/17/1956   Date of visit: 11/05/2024  Diagnosis- history of ER/PR positive HER2 negative breast cancer now with isolated L5 bone metastases stage III right breast cancer and left breast DCIS in 2019   Chief complaint/ Reason for visit-routine follow-up of ER positive metastatic breast cancer with isolated L5 metastases presently on Faslodex  plus Verzenio   Heme/Onc history:  Megan Vega is a 68 y.o. female with clinical stage [id number] right breast cancer and DCIS in the left breast s/p neoadjuvant chemotherapy followed by left simple mastectomy and right modified radical mastectomy on 07/10/2018.  Biopsy showed 1.4 cm grade 2 invasive mammary carcinoma ER greater than 90% positive PR greater than 90% positive and HER2 negative +1   Patient received neoadjuvant AC Taxol  chemotherapy until July 2019.   Left breast pathology revealed 2.8 cm residual high grade DCIS, comedo type.  There was atypical lobular hyperplasia.  Four sentinel lymph nodes were negative.  Right breast pathology revealed 3.6 cm residual grade II invasive mammary carcinoma and high grade DCIS.  There was lymphovascular invasion.There was metastatic carcinoma in 2 sentinel lymph nodes.  Right axillary dissection revealed metastatic carcinoma in 7 lymph nodes.  There were a total of 9 lymph nodes with macrometastasis (largest 22 mm).   Pathologic stage was ypT2 ypN2a.   Patient then received adjuvant radiation treatment and started letrozole  in February 2020   She underwent right breast reconstruction with latissimus myocutaneous flap and removal of her port-a-cath on 02/07/2020.  Expanders were taken out in 05/2020 secondary to infection.  She underwent excision of left breast wound with closure on 10/02/2020. Pathology revealed inflamed granulation tissue and fibrosis. There was no evidence of malignancy.  She received adjuvant Zometa  for 3 years   Patient found to have L5 vertebral lesion that was hypermetabolic on PET scan.  Biopsies negative for malignancy.  There was no lesions seen elsewhere.  A repeat scans in May 2024 again confirmed the presence of L5 vertebral lesion and patient had a second biopsy with osteo-cool procedure/kyphoplasty.Biopsy was consistent with metastatic adenocarcinoma compatible with breast primary.  ER 100% positive, PR 5% positive and HER2 negative.  Not enough tissue available for NGS testing.  Patient started on Faslodex  plus Verzenio  in July 2024  Interval history-patient reports that over the last 2 months she is having increasing low back pain as well as left hip pain more than usual.  Bone scan was ordered and completed in Oct. No convincing skeletal metastasis. Focal uptake in the medial right clavicle at the sternoclavicular junction, favored degenerative. Moderate asymmetric uptake in the left acetabulum, favored degenerative.  Dr. Melanee had mentioned that if pain did not improve she would order a MRI.  Patient reports no improvement in back/hip pain will order MRI this visit.    She also reports going nausea, fatigue and diarrhea that she feels comes from the verzenio .  Discussed with Dr. Melanee about dose adjustment as patient reports this was mentioned in the past.  Dr. Melanee  agreed to dose reduction of verzenio  to 50 mg BID.  Patient in agreement with this plan of care.   ECOG PS- 0 Pain scale-  4 Opioid associated constipation- no  Review of systems- Review of Systems  Constitutional:  Negative for chills, fever, malaise/fatigue and weight loss.  HENT:  Negative for congestion, ear discharge and nosebleeds.   Eyes:  Negative for blurred vision.  Respiratory:  Negative for cough, hemoptysis, sputum production, shortness of breath and wheezing.   Cardiovascular:  Negative for chest pain, palpitations, orthopnea and claudication.  Gastrointestinal:  Positive for diarrhea and nausea. Negative for abdominal pain, blood in stool, constipation, heartburn, melena and vomiting.  Genitourinary:  Negative for dysuria, flank pain, frequency, hematuria and urgency.  Musculoskeletal:  Positive for back pain. Negative for joint pain (Left hip pain) and myalgias.  Skin:  Negative for rash.  Neurological:  Negative for dizziness, tingling, focal weakness, seizures, weakness and headaches.  Endo/Heme/Allergies:  Does not bruise/bleed easily.  Psychiatric/Behavioral:  Negative for depression and suicidal ideas. The patient does not have insomnia.       Allergies  Allergen Reactions   Peanut-Containing Drug Products Swelling and Other (See Comments)    Only when eating too many peanuts, swelling with lips and tingly face     Past Medical History:  Diagnosis Date   Ankle fracture    Breast cancer (HCC)    bilateral   Cancer (HCC)    Basal Cell Carcinoma, about 2011 chest   Cataract    Family history of adverse reaction to anesthesia    daughter with PONV   Hemorrhoids    History of methicillin resistant staphylococcus aureus (MRSA) 2015   Migraines    MIGRAINES occ   Personal history of chemotherapy    Personal history of radiation therapy    Wears glasses      Past Surgical History:  Procedure Laterality Date   BREAST BIOPSY Bilateral 10/29/2017   L breast DCIS, R breast invasive mammary carcinoma of no special type, ER/PR+, Her2/neu 1+   BREAST RECONSTRUCTION  Right 02/07/2020   Procedure: BREAST RECONSTRUCTION;  Surgeon: Lowery Estefana RAMAN, DO;  Location: [facility];  Service: Plastics;  Laterality: Right;   BREAST RECONSTRUCTION WITH PLACEMENT OF TISSUE EXPANDER AND FLEX HD (ACELLULAR HYDRATED DERMIS) Bilateral 08/30/2019   Procedure: BREAST RECONSTRUCTION WITH PLACEMENT OF TISSUE EXPANDER AND FLEX HD (ACELLULAR HYDRATED DERMIS);  Surgeon: Lowery Estefana RAMAN, DO;  Location: [facility];  Service: Plastics;  Laterality: Bilateral;  2.5 hours, please   CESAREAN SECTION     COLONOSCOPY  2012   COLONOSCOPY WITH PROPOFOL  N/A 12/01/2018   Procedure: COLONOSCOPY WITH PROPOFOL ;  Surgeon: Jinny Carmine, MD;  Location: [facility];  Service: Endoscopy;  Laterality: N/A;   DEBRIDEMENT AND CLOSURE WOUND Bilateral 10/02/2020   Procedure: Excision of bilateral breast wound;  Surgeon: Lowery Estefana RAMAN, DO;  Location: [facility];  Service: Plastics;  Laterality: Bilateral;  1.5 hours, please   FRACTURE SURGERY     ankle   IR CT BONE TROCAR/NEEDLE BIOPSY DEEP  12/26/2022   IR KYPHO LUMBAR INC FX REDUCE BONE BX UNI/BIL CANNULATION INC/IMAGING  05/14/2023   IR RADIOLOGIST EVAL & MGMT  04/28/2023   IR RADIOLOGIST EVAL & MGMT  06/27/2023   IRRIGATION AND DEBRIDEMENT ABSCESS Right 03/12/2018   Procedure: IRRIGATION AND DEBRIDEMENT RIGHT AXILLARY ABSCESS;  Surgeon: Dessa Reyes ORN, MD;  Location: [facility];  Service: General;  Laterality: Right;   LATISSIMUS FLAP TO BREAST Right  02/07/2020   Procedure: LATISSIMUS MYOCUTANEOUS FLAP TO BREAST;  Surgeon: Lowery Estefana RAMAN, DO;  Location: [facility];  Service: Plastics;  Laterality: Right;  3 hours   MANDIBLE FRACTURE SURGERY     MASTECTOMY Bilateral    2019   MASTECTOMY MODIFIED RADICAL Right 07/10/2018   ypT2 ypN2a ER/PR positive, HER-2/neu negative  Surgeon: Dessa Reyes ORN, MD;  Location: [facility];  Service: General;  Laterality: Right;   PORT-A-CATH REMOVAL Left 02/07/2020    Procedure: REMOVAL PORT-A-CATH;  Surgeon: Lowery Estefana RAMAN, DO;  Location: [facility];  Service: Plastics;  Laterality: Left;   PORTACATH PLACEMENT Left 11/28/2017   Procedure: INSERTION PORT-A-CATH;  Surgeon: Dessa Reyes ORN, MD;  Location: [facility];  Service: General;  Laterality: Left;   REMOVAL OF TISSUE EXPANDER Right 06/05/2020   Procedure: TISSUE EXPANDER REMOVAL;  Surgeon: Lowery Estefana RAMAN, DO;  Location: [facility];  Service: Plastics;  Laterality: Right;   SIMPLE MASTECTOMY WITH AXILLARY SENTINEL NODE BIOPSY Left 07/10/2018   High-grade DCIS SIMPLE MASTECTOMY;  Surgeon: Dessa Reyes ORN, MD;  Location: [facility];  Service: General;  Laterality: Left;   TISSUE EXPANDER PLACEMENT Left 06/05/2020   Procedure: TISSUE EXPANDER FLUID REMOVAL-100ML;  Surgeon: Lowery Estefana RAMAN, DO;  Location: [facility];  Service: Plastics;  Laterality: Left;   TISSUE EXPANDER PLACEMENT Left 06/14/2020   Procedure: TISSUE EXPANDER REMOVAL;  Surgeon: Lowery Estefana RAMAN, DO;  Location: [facility];  Service: Plastics;  Laterality: Left;  45 min, please    Social History   Socioeconomic History   Marital status: Divorced    Spouse name: Not on file   Number of children: 2   Years of education: Not on file   Highest education level: 12th grade  Occupational History   Occupation: Retired  Tobacco Use   Smoking status: Former    Current packs/day: 0.00    Average packs/day: 0.3 packs/day for 10.0 years (2.5 ttl pk-yrs)    Types: Cigarettes    Start date: 1968    Quit date: 1978    Years since quitting: 47.9   Smokeless tobacco: Never  Vaping Use   Vaping status: Never Used  Substance and Sexual Activity   Alcohol use: Yes    Alcohol/week: 1.0 standard drink of alcohol    Types: 1 Glasses of wine per week    Comment:  occasional glass of wine    Drug use: No   Sexual activity: Not Currently    Birth control/protection: Post-menopausal  Other Topics Concern   Not on file   Social History Narrative   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    Lives at home (son 63 yo lives with her)   Children 2   Pets: 3 small dogs and 1 frog    Caffeine- 1 cup of coffee in the morning, rare tea   Social Drivers of Health   Tobacco Use: Medium Risk (11/05/2024)   Patient History    Smoking Tobacco Use: Former    Smokeless Tobacco Use: Never    Passive Exposure: Not on file  Financial Resource Strain: Medium Risk (04/03/2024)   Overall Financial Resource Strain (CARDIA)    Difficulty of Paying Living Expenses: Somewhat hard  Food Insecurity: Food Insecurity Present (04/07/2024)   Hunger Vital Sign    Worried About Running Out of Food in the Last Year: Sometimes true    Ran Out of Food in the Last Year: Never true  Transportation Needs: No Transportation Needs (04/03/2024)   PRAPARE -  Administrator, Civil Service (Medical): No    Lack of Transportation (Non-Medical): No  Physical Activity: Sufficiently Active (04/03/2024)   Exercise Vital Sign    Days of Exercise per Week: 7 days    Minutes of Exercise per Session: 30 min  Stress: No Stress Concern Present (04/03/2024)   [employer] of Occupational Health - Occupational Stress Questionnaire    Feeling of Stress : Not at all  Social Connections: Moderately Isolated (04/03/2024)   Social Connection and Isolation Panel    Frequency of Communication with Friends and Family: More than three times a week    Frequency of Social Gatherings with Friends and Family: More than three times a week    Attends Religious Services: More than 4 times per year    Active Member of [employer] or Organizations: No    Attends Banker Meetings: Never    Marital Status: Divorced  Catering Manager Violence: Not At Risk (04/07/2024)   Humiliation, Afraid, Rape, and Kick questionnaire    Fear of Current or Ex-Partner: No    Emotionally Abused: No    Physically Abused: No    Sexually Abused: No   Depression (PHQ2-9): Low Risk (08/11/2024)   Depression (PHQ2-9)    PHQ-2 Score: 0  Alcohol Screen: Low Risk (04/03/2024)   Alcohol Screen    Last Alcohol Screening Score (AUDIT): 1  Housing: Low Risk (04/03/2024)   Housing Stability Vital Sign    Unable to Pay for Housing in the Last Year: No    Number of Times Moved in the Last Year: 0    Homeless in the Last Year: No  Utilities: Not At Risk (04/07/2024)   [employer] Utilities    Threatened with loss of utilities: No  Health Literacy: Adequate Health Literacy (04/07/2024)   B1300 Health Literacy    Frequency of need for help with medical instructions: Never    Family History  Problem Relation Age of Onset   Cancer Mother        Esophageal Cancer   Early death Mother        Age 20   Cancer Father        Lung Cancer   Diabetes Father    Diabetes Brother        Controlled with diet   Heart attack Paternal Grandfather    Colon cancer Neg Hx      Current Outpatient Medications:    abemaciclib  (VERZENIO ) 50 MG tablet, Take 1 tablet (50 mg total) by mouth 2 (two) times daily., Disp: 56 tablet, Rfl: 0   b complex vitamins tablet, Take 1 tablet by mouth daily., Disp: , Rfl:    Calcium -Magnesium-Vitamin D  (CALCIUM  1200+D3 PO), Take 1,200 mg by mouth daily. , Disp: , Rfl:    diphenoxylate -atropine  (LOMOTIL ) 2.5-0.025 MG tablet, Take 2 tablets by mouth 4 (four) times daily as needed for diarrhea or loose stools., Disp: 30 tablet, Rfl: 1   loperamide  (IMODIUM ) 2 MG capsule, Take 2 tabs by mouth with first loose stool, then 1 tab with each additional loose stool as needed. Do not exceed 8 tabs in a 24-hour period, Disp: 60 capsule, Rfl: 2   loratadine (CLARITIN) 10 MG tablet, Take 10 mg by mouth every morning. , Disp: , Rfl:    Melatonin 5 MG CAPS, Take 5 mg by mouth daily as needed (sleep)., Disp: , Rfl:    Multiple Vitamin (MULTIVITAMIN WITH MINERALS) TABS tablet, Take 1 tablet by mouth daily. Centrum Silver, Disp: ,  Rfl:     Naphazoline HCl (CLEAR EYES OP), Place 1 drop into both eyes daily., Disp: , Rfl:    ondansetron  (ZOFRAN ) 8 MG tablet, Take 1 tablet (8 mg total) by mouth every 8 (eight) hours as needed for nausea or vomiting., Disp: 15 tablet, Rfl: 0   Probiotic Product (PROBIOTIC PO), Take 1 capsule by mouth daily., Disp: , Rfl:    prochlorperazine  (COMPAZINE ) 10 MG tablet, Take 1 tablet (10 mg total) by mouth every 6 (six) hours as needed for nausea or vomiting., Disp: 30 tablet, Rfl: 1 No current facility-administered medications for this visit.  Facility-Administered Medications Ordered in Other Visits:    heparin  lock flush 100 unit/mL, 500 Units, Intracatheter, PRN, Corcoran, Melissa C, MD  Physical exam:  Vitals:   11/05/24 1408  BP: 117/80  Pulse: 87  Resp: 18  Temp: 97.6 F (36.4 C)  TempSrc: Tympanic  SpO2: 99%  Weight: 143 lb (64.9 kg)  Height: 5' 5 (1.651 m)    Physical Exam Cardiovascular:     Rate and Rhythm: Normal rate and regular rhythm.     Heart sounds: Normal heart sounds.  Pulmonary:     Effort: Pulmonary effort is normal.     Breath sounds: Normal breath sounds.  Abdominal:     General: Bowel sounds are normal.     Palpations: Abdomen is soft.  Skin:    General: Skin is warm and dry.  Neurological:     Mental Status: She is alert and oriented to person, place, and time.      I have personally reviewed labs listed below:    Latest Ref Rng & Units 11/05/2024    1:40 PM  CMP  Glucose 70 - 99 mg/dL 98   BUN 8 - 23 mg/dL 12   Creatinine 9.55 - 1.00 mg/dL 8.82   Sodium 864 - 854 mmol/L 139   Potassium 3.5 - 5.1 mmol/L 3.8   Chloride 98 - 111 mmol/L 104   CO2 22 - 32 mmol/L 24   Calcium  8.9 - 10.3 mg/dL 9.8   Total Protein 6.5 - 8.1 g/dL 7.1   Total Bilirubin 0.0 - 1.2 mg/dL 0.6   Alkaline Phos 38 - 126 U/L 57   AST 15 - 41 U/L 38   ALT 0 - 44 U/L 25       Latest Ref Rng & Units 11/05/2024    1:40 PM  CBC  WBC 4.0 - 10.5 K/uL 2.9   Hemoglobin  12.0 - 15.0 g/dL 87.3   Hematocrit 63.9 - 46.0 % 36.4   Platelets 150 - 400 K/uL 163       Assessment and plan- Patient is a 68 y.o. female with history of metastatic ER positive breast cancer with isolated L5 metastases s/p kyphoplasty and palliative radiation presently on Verzenio  plus Faslodex  here for routine follow-up  Counts are okay to stay on Verzenio  however patient reports ongoing nausea, fatigue, and diarrhea.  Discussed with Dr. Melanee ok to dose reduce to 50 mg twice daily until progression or toxicity.  She will also remain on monthly Faslodex , proceed with faslodex  today.  Tumor markers in her case have not been helpful since they were normal even when she had isolated L5 metastases.  Given her worsening low back pain and left hip pain bone scan did not show any mets.  She reports ongoing pain in lower back and hip will order MRI of lower back.  I will see her back in 3 months  with CBC with differential CMP CA 27-29 and CA 15-3  Patient gets Zometa  every 6 months and will get her next dose is March   Visit Diagnosis 1. Abnormal MRI, lumbar spine   2. Primary cancer of upper outer quadrant of right breast (HCC)    Follow up plan: Proceed with faslodex  today MRI lumber spine ordered F/U in 3 months see MD CBC with differential CMP CA 27-29 and CA 15-3 and zometa  LP     Morna Lanell MANDES [facility] [phone] 11/05/2024 3:16 PM                "

## 2024-11-05 NOTE — Progress Notes (Signed)
 Patient would like to discuss her going back down on her Verzenio , due to having so much nausea and diarrhea, she is the medications to help with those symptoms just working good.

## 2024-11-16 ENCOUNTER — Other Ambulatory Visit: Payer: Self-pay

## 2024-11-16 NOTE — Progress Notes (Signed)
 Specialty Pharmacy Ongoing Clinical Assessment Note  Megan Vega is a 68 y.o. female who is being followed by the specialty pharmacy service for RxSp Oncology   Patient's specialty medication(s) reviewed today: Abemaciclib  (VERZENIO )   Missed doses in the last 4 weeks: 0   Patient/Caregiver did not have any additional questions or concerns.   Therapeutic benefit summary: Patient is achieving benefit   Adverse events/side effects summary: Experienced adverse events/side effects (patient was having moderate nausea and diarrhea on the 100 mg BID dose, her dose was lowered by her provider to 50 mg BID and she reports no side effects at this time on the lower dose)   Patient's therapy is appropriate to: Continue    Goals Addressed             This Visit's Progress    Slow Disease Progression   On track    Patient is on track. Patient will maintain adherence. CA 27.29 declined and was most recently 14.7 as of 08/11/24.          Follow up: 6 months  Silvano LOISE Dolly Specialty Pharmacist

## 2024-11-29 ENCOUNTER — Other Ambulatory Visit: Payer: Self-pay | Admitting: Oncology

## 2024-11-29 ENCOUNTER — Other Ambulatory Visit: Payer: Self-pay

## 2024-11-29 DIAGNOSIS — C50411 Malignant neoplasm of upper-outer quadrant of right female breast: Secondary | ICD-10-CM

## 2024-11-29 MED ORDER — ABEMACICLIB 50 MG PO TABS
50.0000 mg | ORAL_TABLET | Freq: Two times a day (BID) | ORAL | 0 refills | Status: DC
  Filled 2024-11-29: qty 56, 28d supply, fill #0

## 2024-11-29 NOTE — Progress Notes (Signed)
 Specialty Pharmacy Refill Coordination Note  Megan Vega is a 69 y.o. female contacted today regarding refills of specialty medication(s) Abemaciclib  (VERZENIO )   Patient requested Delivery   Delivery date: 12/02/24   Verified address: [address] [place] [address]   Medication will be filled on: 12/01/24   This fill date is pending response to refill request from provider. Patient is aware and if they have not received fill by intended date they must follow up with pharmacy.

## 2024-12-01 ENCOUNTER — Other Ambulatory Visit: Payer: Self-pay

## 2024-12-06 ENCOUNTER — Inpatient Hospital Stay: Attending: Oncology

## 2024-12-06 DIAGNOSIS — C50411 Malignant neoplasm of upper-outer quadrant of right female breast: Secondary | ICD-10-CM

## 2024-12-06 MED ORDER — FULVESTRANT 250 MG/5ML IM SOSY
500.0000 mg | PREFILLED_SYRINGE | Freq: Once | INTRAMUSCULAR | Status: AC
  Administered 2024-12-06: 500 mg via INTRAMUSCULAR
  Filled 2024-12-06: qty 10

## 2024-12-22 ENCOUNTER — Other Ambulatory Visit (HOSPITAL_COMMUNITY): Payer: Self-pay

## 2024-12-22 ENCOUNTER — Other Ambulatory Visit: Payer: Self-pay | Admitting: Oncology

## 2024-12-22 ENCOUNTER — Other Ambulatory Visit: Payer: Self-pay

## 2024-12-22 DIAGNOSIS — C50411 Malignant neoplasm of upper-outer quadrant of right female breast: Secondary | ICD-10-CM

## 2024-12-22 MED ORDER — ABEMACICLIB 50 MG PO TABS
50.0000 mg | ORAL_TABLET | Freq: Two times a day (BID) | ORAL | 0 refills | Status: AC
  Filled 2024-12-22 – 2024-12-23 (×2): qty 56, 28d supply, fill #0

## 2024-12-23 ENCOUNTER — Other Ambulatory Visit: Payer: Self-pay

## 2024-12-23 NOTE — Progress Notes (Signed)
 Specialty Pharmacy Refill Coordination Note  Megan Vega is a 69 y.o. female contacted today regarding refills of specialty medication(s) Abemaciclib  (VERZENIO )   Patient requested Delivery   Delivery date: 12/30/24   Verified address: [address] [place] [address]   Medication will be filled on: 12/29/24

## 2025-01-03 ENCOUNTER — Inpatient Hospital Stay

## 2025-02-09 ENCOUNTER — Inpatient Hospital Stay

## 2025-02-09 ENCOUNTER — Inpatient Hospital Stay: Admitting: Oncology

## 2025-04-12 ENCOUNTER — Ambulatory Visit

## 2025-04-19 ENCOUNTER — Encounter: Admitting: Family
